# Patient Record
Sex: Male | Born: 1950
Health system: Southern US, Community
[De-identification: ages and names within clinical notes are randomized; demographics above are authoritative.]

## PROBLEM LIST (undated history)

## (undated) DIAGNOSIS — I10 Essential (primary) hypertension: Secondary | ICD-10-CM

## (undated) DIAGNOSIS — E785 Hyperlipidemia, unspecified: Secondary | ICD-10-CM

## (undated) DIAGNOSIS — I4821 Permanent atrial fibrillation: Secondary | ICD-10-CM

## (undated) DIAGNOSIS — I350 Nonrheumatic aortic (valve) stenosis: Secondary | ICD-10-CM

## (undated) DIAGNOSIS — G4733 Obstructive sleep apnea (adult) (pediatric): Secondary | ICD-10-CM

## (undated) DIAGNOSIS — E669 Obesity, unspecified: Secondary | ICD-10-CM

## (undated) DIAGNOSIS — I059 Rheumatic mitral valve disease, unspecified: Secondary | ICD-10-CM

## (undated) DIAGNOSIS — G4730 Sleep apnea, unspecified: Secondary | ICD-10-CM

## (undated) DIAGNOSIS — I38 Endocarditis, valve unspecified: Secondary | ICD-10-CM

## (undated) DIAGNOSIS — D126 Benign neoplasm of colon, unspecified: Secondary | ICD-10-CM

## (undated) DIAGNOSIS — I4891 Unspecified atrial fibrillation: Secondary | ICD-10-CM

## (undated) HISTORY — DX: Permanent atrial fibrillation: I48.21

## (undated) HISTORY — DX: Benign neoplasm of colon, unspecified: D12.6

## (undated) HISTORY — DX: Obesity, unspecified: E66.9

## (undated) HISTORY — DX: Essential (primary) hypertension: I10

## (undated) HISTORY — DX: Endocarditis, valve unspecified: I38

## (undated) HISTORY — DX: Rheumatic mitral valve disease, unspecified: I05.9

## (undated) HISTORY — DX: Sleep apnea, unspecified: G47.30

## (undated) HISTORY — DX: Obstructive sleep apnea (adult) (pediatric): G47.33

## (undated) HISTORY — DX: Hyperlipidemia, unspecified: E78.5

## (undated) HISTORY — PX: FOOT SURGERY: SHX648

## (undated) HISTORY — DX: Unspecified atrial fibrillation: I48.91

## (undated) HISTORY — DX: Nonrheumatic aortic (valve) stenosis: I35.0

---

## 2003-02-25 ENCOUNTER — Inpatient Hospital Stay (HOSPITAL_COMMUNITY): Admission: EM | Admit: 2003-02-25 | Discharge: 2003-02-28 | Payer: Self-pay | Admitting: Emergency Medicine

## 2003-02-26 ENCOUNTER — Encounter: Payer: Self-pay | Admitting: Cardiovascular Disease

## 2003-03-06 ENCOUNTER — Encounter: Admission: RE | Admit: 2003-03-06 | Discharge: 2003-03-06 | Payer: Self-pay | Admitting: Internal Medicine

## 2003-03-14 ENCOUNTER — Encounter: Admission: RE | Admit: 2003-03-14 | Discharge: 2003-03-14 | Payer: Self-pay | Admitting: Internal Medicine

## 2003-03-20 ENCOUNTER — Ambulatory Visit (HOSPITAL_COMMUNITY): Admission: RE | Admit: 2003-03-20 | Discharge: 2003-03-20 | Payer: Self-pay | Admitting: Cardiology

## 2003-03-20 ENCOUNTER — Encounter: Payer: Self-pay | Admitting: Cardiology

## 2003-03-26 ENCOUNTER — Encounter: Admission: RE | Admit: 2003-03-26 | Discharge: 2003-03-26 | Payer: Self-pay | Admitting: Internal Medicine

## 2003-07-06 DIAGNOSIS — D126 Benign neoplasm of colon, unspecified: Secondary | ICD-10-CM

## 2003-07-06 HISTORY — DX: Benign neoplasm of colon, unspecified: D12.6

## 2003-07-29 ENCOUNTER — Encounter: Payer: Self-pay | Admitting: Gastroenterology

## 2003-07-29 DIAGNOSIS — D126 Benign neoplasm of colon, unspecified: Secondary | ICD-10-CM | POA: Insufficient documentation

## 2003-07-30 ENCOUNTER — Encounter: Payer: Self-pay | Admitting: Gastroenterology

## 2004-02-09 ENCOUNTER — Ambulatory Visit: Payer: Self-pay | Admitting: Cardiology

## 2004-05-18 ENCOUNTER — Ambulatory Visit: Payer: Self-pay | Admitting: Family Medicine

## 2004-07-05 ENCOUNTER — Ambulatory Visit: Payer: Self-pay | Admitting: Internal Medicine

## 2004-07-12 ENCOUNTER — Ambulatory Visit: Payer: Self-pay | Admitting: Family Medicine

## 2004-07-19 ENCOUNTER — Ambulatory Visit: Payer: Self-pay | Admitting: Family Medicine

## 2004-10-18 ENCOUNTER — Ambulatory Visit: Payer: Self-pay | Admitting: Family Medicine

## 2004-12-20 ENCOUNTER — Ambulatory Visit: Payer: Self-pay | Admitting: Internal Medicine

## 2005-01-10 ENCOUNTER — Ambulatory Visit: Payer: Self-pay

## 2005-05-24 ENCOUNTER — Ambulatory Visit: Payer: Self-pay | Admitting: Family Medicine

## 2005-06-27 ENCOUNTER — Ambulatory Visit: Payer: Self-pay | Admitting: Internal Medicine

## 2005-07-11 ENCOUNTER — Ambulatory Visit: Payer: Self-pay | Admitting: Family Medicine

## 2006-06-01 ENCOUNTER — Ambulatory Visit: Payer: Self-pay | Admitting: Family Medicine

## 2006-06-12 ENCOUNTER — Ambulatory Visit: Payer: Self-pay | Admitting: Family Medicine

## 2006-06-12 LAB — CONVERTED CEMR LAB
ALT: 27 units/L (ref 0–40)
AST: 24 units/L (ref 0–37)
Albumin: 3.8 g/dL (ref 3.5–5.2)
Alkaline Phosphatase: 74 units/L (ref 39–117)
BUN: 22 mg/dL (ref 6–23)
Basophils Absolute: 0 10*3/uL (ref 0.0–0.1)
Basophils Relative: 0.3 % (ref 0.0–1.0)
Bilirubin, Direct: 0.1 mg/dL (ref 0.0–0.3)
CO2: 30 meq/L (ref 19–32)
Calcium: 10.6 mg/dL — ABNORMAL HIGH (ref 8.4–10.5)
Chloride: 104 meq/L (ref 96–112)
Cholesterol: 150 mg/dL (ref 0–200)
Creatinine, Ser: 0.8 mg/dL (ref 0.4–1.5)
Eosinophils Absolute: 0.3 10*3/uL (ref 0.0–0.6)
Eosinophils Relative: 3.6 % (ref 0.0–5.0)
GFR calc Af Amer: 129 mL/min
GFR calc non Af Amer: 106 mL/min
Glucose, Bld: 116 mg/dL — ABNORMAL HIGH (ref 70–99)
HCT: 40.9 % (ref 39.0–52.0)
HDL: 36.4 mg/dL — ABNORMAL LOW (ref >39.0)
Hemoglobin: 14.3 g/dL (ref 13.0–17.0)
Hgb A1c MFr Bld: 5.6 % (ref 4.6–6.0)
LDL Cholesterol: 94 mg/dL (ref 0–99)
Lymphocytes Relative: 31.5 % (ref 12.0–46.0)
MCHC: 34.9 g/dL (ref 30.0–36.0)
MCV: 92.7 fL (ref 78.0–100.0)
Monocytes Absolute: 0.8 10*3/uL — ABNORMAL HIGH (ref 0.2–0.7)
Monocytes Relative: 10.9 % (ref 3.0–11.0)
Neutro Abs: 4.1 10*3/uL (ref 1.4–7.7)
Neutrophils Relative %: 53.7 % (ref 43.0–77.0)
PSA: 0.22 ng/mL (ref 0.10–4.00)
Platelets: 237 10*3/uL (ref 150–400)
Potassium: 4.2 meq/L (ref 3.5–5.1)
RBC: 4.42 M/uL (ref 4.22–5.81)
RDW: 13 % (ref 11.5–14.6)
Sodium: 139 meq/L (ref 135–145)
TSH: 1.46 microintl units/mL (ref 0.35–5.50)
Total Bilirubin: 0.7 mg/dL (ref 0.3–1.2)
Total CHOL/HDL Ratio: 4.1
Total Protein: 6.7 g/dL (ref 6.0–8.3)
Triglycerides: 99 mg/dL (ref 0–149)
VLDL: 20 mg/dL (ref 0–40)
WBC: 7.6 10*3/uL (ref 4.5–10.5)

## 2006-07-03 ENCOUNTER — Ambulatory Visit: Payer: Self-pay

## 2006-07-03 ENCOUNTER — Encounter: Payer: Self-pay | Admitting: Cardiology

## 2006-07-10 ENCOUNTER — Ambulatory Visit: Payer: Self-pay | Admitting: Internal Medicine

## 2006-09-04 ENCOUNTER — Ambulatory Visit: Payer: Self-pay | Admitting: Family Medicine

## 2006-09-04 DIAGNOSIS — I1A0 Resistant hypertension: Secondary | ICD-10-CM | POA: Insufficient documentation

## 2006-09-04 DIAGNOSIS — I10 Essential (primary) hypertension: Secondary | ICD-10-CM | POA: Insufficient documentation

## 2006-09-04 DIAGNOSIS — Z87442 Personal history of urinary calculi: Secondary | ICD-10-CM | POA: Insufficient documentation

## 2006-09-04 DIAGNOSIS — I059 Rheumatic mitral valve disease, unspecified: Secondary | ICD-10-CM | POA: Insufficient documentation

## 2006-10-05 ENCOUNTER — Ambulatory Visit: Payer: Self-pay | Admitting: Gastroenterology

## 2006-11-14 ENCOUNTER — Encounter: Payer: Self-pay | Admitting: Family Medicine

## 2006-11-14 ENCOUNTER — Encounter: Payer: Self-pay | Admitting: Gastroenterology

## 2006-11-14 ENCOUNTER — Ambulatory Visit: Payer: Self-pay | Admitting: Gastroenterology

## 2007-05-01 ENCOUNTER — Ambulatory Visit: Payer: Self-pay | Admitting: Family Medicine

## 2007-05-01 DIAGNOSIS — J209 Acute bronchitis, unspecified: Secondary | ICD-10-CM

## 2007-06-18 DIAGNOSIS — E785 Hyperlipidemia, unspecified: Secondary | ICD-10-CM | POA: Insufficient documentation

## 2007-06-18 DIAGNOSIS — K644 Residual hemorrhoidal skin tags: Secondary | ICD-10-CM | POA: Insufficient documentation

## 2007-06-18 DIAGNOSIS — K648 Other hemorrhoids: Secondary | ICD-10-CM | POA: Insufficient documentation

## 2007-06-18 DIAGNOSIS — E669 Obesity, unspecified: Secondary | ICD-10-CM | POA: Insufficient documentation

## 2007-08-10 ENCOUNTER — Ambulatory Visit: Payer: Self-pay | Admitting: Family Medicine

## 2007-08-10 DIAGNOSIS — M722 Plantar fascial fibromatosis: Secondary | ICD-10-CM | POA: Insufficient documentation

## 2007-10-15 ENCOUNTER — Ambulatory Visit: Payer: Self-pay | Admitting: Family Medicine

## 2007-11-05 ENCOUNTER — Encounter: Payer: Self-pay | Admitting: Internal Medicine

## 2007-11-05 ENCOUNTER — Ambulatory Visit: Payer: Self-pay

## 2007-11-05 ENCOUNTER — Ambulatory Visit: Payer: Self-pay | Admitting: Internal Medicine

## 2007-11-05 LAB — CONVERTED CEMR LAB
AST: 25 units/L (ref 0–37)
Cholesterol: 132 mg/dL (ref 0–200)
HDL: 27.4 mg/dL — ABNORMAL LOW (ref >39.0)
LDL Cholesterol: 77 mg/dL (ref 0–99)
Total CHOL/HDL Ratio: 4.8
Triglycerides: 137 mg/dL (ref 0–149)
VLDL: 27 mg/dL (ref 0–40)

## 2007-11-19 ENCOUNTER — Ambulatory Visit: Payer: Self-pay | Admitting: Internal Medicine

## 2007-12-06 ENCOUNTER — Encounter: Admission: RE | Admit: 2007-12-06 | Discharge: 2008-03-05 | Payer: Self-pay | Admitting: Podiatry

## 2007-12-10 ENCOUNTER — Encounter: Admission: RE | Admit: 2007-12-10 | Discharge: 2008-01-07 | Payer: Self-pay | Admitting: Internal Medicine

## 2008-01-07 ENCOUNTER — Encounter: Admission: RE | Admit: 2008-01-07 | Discharge: 2008-01-07 | Payer: Self-pay | Admitting: Internal Medicine

## 2008-06-20 ENCOUNTER — Ambulatory Visit: Payer: Self-pay | Admitting: Family Medicine

## 2008-08-11 ENCOUNTER — Ambulatory Visit: Payer: Self-pay | Admitting: Family Medicine

## 2008-08-11 LAB — CONVERTED CEMR LAB
Bilirubin Urine: NEGATIVE
Blood in Urine, dipstick: NEGATIVE
Glucose, Urine, Semiquant: NEGATIVE
Ketones, urine, test strip: NEGATIVE
Nitrite: NEGATIVE
Protein, U semiquant: NEGATIVE
Specific Gravity, Urine: 1.02
Urobilinogen, UA: 0.2
WBC Urine, dipstick: NEGATIVE
pH: 5.5

## 2008-08-15 LAB — CONVERTED CEMR LAB
ALT: 30 units/L (ref 0–53)
AST: 32 units/L (ref 0–37)
Albumin: 3.8 g/dL (ref 3.5–5.2)
Alkaline Phosphatase: 75 units/L (ref 39–117)
BUN: 20 mg/dL (ref 6–23)
Basophils Absolute: 0 10*3/uL (ref 0.0–0.1)
Basophils Relative: 0.2 % (ref 0.0–3.0)
Bilirubin, Direct: 0.1 mg/dL (ref 0.0–0.3)
CO2: 28 meq/L (ref 19–32)
Calcium: 10.1 mg/dL (ref 8.4–10.5)
Chloride: 111 meq/L (ref 96–112)
Cholesterol: 140 mg/dL (ref 0–200)
Creatinine, Ser: 0.9 mg/dL (ref 0.4–1.5)
Eosinophils Absolute: 0.1 10*3/uL (ref 0.0–0.7)
Eosinophils Relative: 2 % (ref 0.0–5.0)
GFR calc non Af Amer: 91.93 mL/min (ref >60)
Glucose, Bld: 114 mg/dL — ABNORMAL HIGH (ref 70–99)
HCT: 42.1 % (ref 39.0–52.0)
HDL: 36.8 mg/dL — ABNORMAL LOW (ref >39.00)
Hemoglobin: 15 g/dL (ref 13.0–17.0)
LDL Cholesterol: 86 mg/dL (ref 0–99)
Lymphocytes Relative: 32.9 % (ref 12.0–46.0)
Lymphs Abs: 2.1 10*3/uL (ref 0.7–4.0)
MCHC: 35.6 g/dL (ref 30.0–36.0)
MCV: 92.5 fL (ref 78.0–100.0)
Monocytes Absolute: 0.5 10*3/uL (ref 0.1–1.0)
Monocytes Relative: 7.7 % (ref 3.0–12.0)
Neutro Abs: 3.7 10*3/uL (ref 1.4–7.7)
Neutrophils Relative %: 57.2 % (ref 43.0–77.0)
PSA: 0.24 ng/mL (ref 0.10–4.00)
Platelets: 192 10*3/uL (ref 150.0–400.0)
Potassium: 3.9 meq/L (ref 3.5–5.1)
RBC: 4.55 M/uL (ref 4.22–5.81)
RDW: 12.4 % (ref 11.5–14.6)
Sodium: 141 meq/L (ref 135–145)
TSH: 1.23 microintl units/mL (ref 0.35–5.50)
Total Bilirubin: 0.9 mg/dL (ref 0.3–1.2)
Total CHOL/HDL Ratio: 4
Total Protein: 6.9 g/dL (ref 6.0–8.3)
Triglycerides: 86 mg/dL (ref 0.0–149.0)
VLDL: 17.2 mg/dL (ref 0.0–40.0)
WBC: 6.4 10*3/uL (ref 4.5–10.5)

## 2008-08-18 ENCOUNTER — Ambulatory Visit: Payer: Self-pay | Admitting: Family Medicine

## 2008-08-18 DIAGNOSIS — Z8639 Personal history of other endocrine, nutritional and metabolic disease: Secondary | ICD-10-CM

## 2008-08-18 DIAGNOSIS — Z862 Personal history of diseases of the blood and blood-forming organs and certain disorders involving the immune mechanism: Secondary | ICD-10-CM | POA: Insufficient documentation

## 2008-08-18 DIAGNOSIS — I4891 Unspecified atrial fibrillation: Secondary | ICD-10-CM | POA: Insufficient documentation

## 2008-08-20 ENCOUNTER — Encounter (INDEPENDENT_AMBULATORY_CARE_PROVIDER_SITE_OTHER): Payer: Self-pay | Admitting: Cardiology

## 2008-08-20 ENCOUNTER — Encounter: Payer: Self-pay | Admitting: Physician Assistant

## 2008-08-20 ENCOUNTER — Encounter: Payer: Self-pay | Admitting: Cardiology

## 2008-08-20 ENCOUNTER — Ambulatory Visit: Payer: Self-pay | Admitting: Cardiology

## 2008-08-20 ENCOUNTER — Ambulatory Visit: Payer: Self-pay | Admitting: Internal Medicine

## 2008-08-20 DIAGNOSIS — I359 Nonrheumatic aortic valve disorder, unspecified: Secondary | ICD-10-CM | POA: Insufficient documentation

## 2008-08-20 LAB — CONVERTED CEMR LAB
POC INR: 0.9
Protime: 11.8

## 2008-08-25 ENCOUNTER — Encounter (INDEPENDENT_AMBULATORY_CARE_PROVIDER_SITE_OTHER): Payer: Self-pay | Admitting: *Deleted

## 2008-08-25 ENCOUNTER — Ambulatory Visit: Payer: Self-pay | Admitting: Internal Medicine

## 2008-08-25 LAB — CONVERTED CEMR LAB
INR: 1.3
POC INR: 1.3
Protime: 14.2

## 2008-08-29 ENCOUNTER — Encounter: Payer: Self-pay | Admitting: Internal Medicine

## 2008-08-29 ENCOUNTER — Ambulatory Visit: Payer: Self-pay | Admitting: Internal Medicine

## 2008-08-29 ENCOUNTER — Ambulatory Visit: Payer: Self-pay

## 2008-08-29 ENCOUNTER — Encounter: Payer: Self-pay | Admitting: Cardiology

## 2008-09-01 ENCOUNTER — Ambulatory Visit: Payer: Self-pay | Admitting: Cardiology

## 2008-09-01 LAB — CONVERTED CEMR LAB
POC INR: 1.7
Prothrombin Time: 16.2 s

## 2008-09-03 ENCOUNTER — Telehealth: Payer: Self-pay | Admitting: Cardiology

## 2008-09-03 LAB — CONVERTED CEMR LAB: TSH: 1.556 microintl units/mL (ref 0.350–4.500)

## 2008-09-05 ENCOUNTER — Ambulatory Visit: Payer: Self-pay | Admitting: Internal Medicine

## 2008-09-05 DIAGNOSIS — G4730 Sleep apnea, unspecified: Secondary | ICD-10-CM | POA: Insufficient documentation

## 2008-09-11 ENCOUNTER — Ambulatory Visit: Payer: Self-pay | Admitting: Internal Medicine

## 2008-09-11 LAB — CONVERTED CEMR LAB
POC INR: 1.7
Prothrombin Time: 15.9 s

## 2008-09-22 ENCOUNTER — Ambulatory Visit: Payer: Self-pay | Admitting: Cardiology

## 2008-09-22 LAB — CONVERTED CEMR LAB
POC INR: 1.7
Prothrombin Time: 16.2 s

## 2008-10-05 ENCOUNTER — Ambulatory Visit (HOSPITAL_BASED_OUTPATIENT_CLINIC_OR_DEPARTMENT_OTHER): Admission: RE | Admit: 2008-10-05 | Discharge: 2008-10-05 | Payer: Self-pay | Admitting: Internal Medicine

## 2008-10-06 ENCOUNTER — Ambulatory Visit: Payer: Self-pay | Admitting: Internal Medicine

## 2008-10-06 LAB — CONVERTED CEMR LAB
POC INR: 1.9
Prothrombin Time: 17.1 s

## 2008-10-18 ENCOUNTER — Encounter: Payer: Self-pay | Admitting: Internal Medicine

## 2008-10-18 ENCOUNTER — Ambulatory Visit: Payer: Self-pay | Admitting: Pulmonary Disease

## 2008-10-20 ENCOUNTER — Ambulatory Visit: Payer: Self-pay | Admitting: Cardiovascular Disease

## 2008-10-20 LAB — CONVERTED CEMR LAB: POC INR: 2

## 2008-10-27 ENCOUNTER — Telehealth (INDEPENDENT_AMBULATORY_CARE_PROVIDER_SITE_OTHER): Payer: Self-pay | Admitting: *Deleted

## 2008-11-03 ENCOUNTER — Ambulatory Visit: Payer: Self-pay | Admitting: Cardiovascular Disease

## 2008-11-17 ENCOUNTER — Ambulatory Visit: Payer: Self-pay | Admitting: Pulmonary Disease

## 2008-11-17 DIAGNOSIS — G4733 Obstructive sleep apnea (adult) (pediatric): Secondary | ICD-10-CM | POA: Insufficient documentation

## 2008-11-21 ENCOUNTER — Ambulatory Visit: Payer: Self-pay | Admitting: Internal Medicine

## 2008-11-24 ENCOUNTER — Ambulatory Visit: Payer: Self-pay | Admitting: Cardiology

## 2008-11-24 LAB — CONVERTED CEMR LAB: POC INR: 2

## 2008-12-15 ENCOUNTER — Ambulatory Visit: Payer: Self-pay | Admitting: Internal Medicine

## 2008-12-15 LAB — CONVERTED CEMR LAB: POC INR: 2.2

## 2008-12-22 ENCOUNTER — Ambulatory Visit: Payer: Self-pay | Admitting: Pulmonary Disease

## 2009-01-12 ENCOUNTER — Ambulatory Visit: Payer: Self-pay | Admitting: Internal Medicine

## 2009-01-12 LAB — CONVERTED CEMR LAB: POC INR: 2.1

## 2009-02-13 ENCOUNTER — Ambulatory Visit: Payer: Self-pay | Admitting: Cardiology

## 2009-02-13 LAB — CONVERTED CEMR LAB: POC INR: 1.9

## 2009-02-16 ENCOUNTER — Ambulatory Visit: Payer: Self-pay | Admitting: Internal Medicine

## 2009-02-20 ENCOUNTER — Ambulatory Visit: Payer: Self-pay | Admitting: Cardiovascular Disease

## 2009-02-20 LAB — CONVERTED CEMR LAB: POC INR: 1.9

## 2009-02-26 ENCOUNTER — Ambulatory Visit: Payer: Self-pay | Admitting: Cardiology

## 2009-02-26 LAB — CONVERTED CEMR LAB: POC INR: 2.1

## 2009-03-09 ENCOUNTER — Ambulatory Visit: Payer: Self-pay | Admitting: Cardiology

## 2009-03-09 LAB — CONVERTED CEMR LAB: POC INR: 2.1

## 2009-03-14 ENCOUNTER — Encounter: Payer: Self-pay | Admitting: Pulmonary Disease

## 2009-03-16 ENCOUNTER — Ambulatory Visit: Payer: Self-pay | Admitting: Internal Medicine

## 2009-03-16 LAB — CONVERTED CEMR LAB: POC INR: 2.4

## 2009-03-23 ENCOUNTER — Ambulatory Visit: Payer: Self-pay | Admitting: Cardiology

## 2009-03-23 ENCOUNTER — Encounter (INDEPENDENT_AMBULATORY_CARE_PROVIDER_SITE_OTHER): Payer: Self-pay | Admitting: *Deleted

## 2009-03-23 LAB — CONVERTED CEMR LAB: POC INR: 2.2

## 2009-03-26 ENCOUNTER — Encounter: Payer: Self-pay | Admitting: Pulmonary Disease

## 2009-04-03 ENCOUNTER — Ambulatory Visit: Payer: Self-pay | Admitting: Internal Medicine

## 2009-04-03 ENCOUNTER — Ambulatory Visit (HOSPITAL_COMMUNITY): Admission: RE | Admit: 2009-04-03 | Discharge: 2009-04-03 | Payer: Self-pay | Admitting: Internal Medicine

## 2009-04-07 ENCOUNTER — Ambulatory Visit: Payer: Self-pay | Admitting: Internal Medicine

## 2009-04-07 LAB — CONVERTED CEMR LAB: POC INR: 2.2

## 2009-04-27 ENCOUNTER — Ambulatory Visit: Payer: Self-pay | Admitting: Internal Medicine

## 2009-04-27 LAB — CONVERTED CEMR LAB: POC INR: 2.4

## 2009-05-08 ENCOUNTER — Telehealth: Payer: Self-pay | Admitting: Internal Medicine

## 2009-05-25 ENCOUNTER — Ambulatory Visit: Payer: Self-pay | Admitting: Cardiology

## 2009-05-25 LAB — CONVERTED CEMR LAB: POC INR: 2.3

## 2009-06-01 ENCOUNTER — Ambulatory Visit (HOSPITAL_COMMUNITY): Admission: RE | Admit: 2009-06-01 | Discharge: 2009-06-01 | Payer: Self-pay | Admitting: Internal Medicine

## 2009-06-01 ENCOUNTER — Ambulatory Visit: Payer: Self-pay | Admitting: Internal Medicine

## 2009-06-01 ENCOUNTER — Encounter: Payer: Self-pay | Admitting: Internal Medicine

## 2009-06-05 ENCOUNTER — Ambulatory Visit: Payer: Self-pay | Admitting: Internal Medicine

## 2009-06-15 ENCOUNTER — Ambulatory Visit: Payer: Self-pay | Admitting: Pulmonary Disease

## 2009-06-22 ENCOUNTER — Ambulatory Visit: Payer: Self-pay | Admitting: Cardiology

## 2009-06-22 LAB — CONVERTED CEMR LAB: POC INR: 1.9

## 2009-07-13 ENCOUNTER — Ambulatory Visit: Payer: Self-pay | Admitting: Cardiology

## 2009-07-13 LAB — CONVERTED CEMR LAB: POC INR: 2.3

## 2009-08-04 ENCOUNTER — Ambulatory Visit: Payer: Self-pay | Admitting: Internal Medicine

## 2009-08-04 LAB — CONVERTED CEMR LAB: POC INR: 2.4

## 2009-09-01 ENCOUNTER — Ambulatory Visit: Payer: Self-pay | Admitting: Internal Medicine

## 2009-09-01 LAB — CONVERTED CEMR LAB: POC INR: 2.3

## 2009-09-28 ENCOUNTER — Ambulatory Visit: Payer: Self-pay | Admitting: Cardiology

## 2009-09-28 LAB — CONVERTED CEMR LAB: POC INR: 2.3

## 2009-10-15 ENCOUNTER — Encounter (INDEPENDENT_AMBULATORY_CARE_PROVIDER_SITE_OTHER): Payer: Self-pay | Admitting: *Deleted

## 2009-10-26 ENCOUNTER — Ambulatory Visit: Payer: Self-pay | Admitting: Cardiology

## 2009-10-26 LAB — CONVERTED CEMR LAB: POC INR: 2.4

## 2009-10-29 ENCOUNTER — Ambulatory Visit: Payer: Self-pay | Admitting: Family Medicine

## 2009-11-02 ENCOUNTER — Ambulatory Visit: Payer: Self-pay | Admitting: Family Medicine

## 2009-11-02 LAB — CONVERTED CEMR LAB
Bilirubin Urine: NEGATIVE
Glucose, Urine, Semiquant: NEGATIVE
Ketones, urine, test strip: NEGATIVE
Nitrite: NEGATIVE
Protein, U semiquant: NEGATIVE
Specific Gravity, Urine: 1.02
Urobilinogen, UA: 0.2
WBC Urine, dipstick: NEGATIVE
pH: 5

## 2009-11-03 LAB — CONVERTED CEMR LAB
ALT: 27 units/L (ref 0–53)
AST: 24 units/L (ref 0–37)
Albumin: 4 g/dL (ref 3.5–5.2)
Alkaline Phosphatase: 72 units/L (ref 39–117)
BUN: 16 mg/dL (ref 6–23)
Basophils Absolute: 0 10*3/uL (ref 0.0–0.1)
Basophils Relative: 0.4 % (ref 0.0–3.0)
Bilirubin, Direct: 0.1 mg/dL (ref 0.0–0.3)
CO2: 26 meq/L (ref 19–32)
Calcium: 10.5 mg/dL (ref 8.4–10.5)
Chloride: 106 meq/L (ref 96–112)
Cholesterol: 126 mg/dL (ref 0–200)
Creatinine, Ser: 0.8 mg/dL (ref 0.4–1.5)
Eosinophils Absolute: 0.1 10*3/uL (ref 0.0–0.7)
Eosinophils Relative: 2.1 % (ref 0.0–5.0)
GFR calc non Af Amer: 100.51 mL/min (ref >60)
Glucose, Bld: 102 mg/dL — ABNORMAL HIGH (ref 70–99)
HCT: 41.3 % (ref 39.0–52.0)
HDL: 36.1 mg/dL — ABNORMAL LOW (ref >39.00)
Hemoglobin: 14.3 g/dL (ref 13.0–17.0)
LDL Cholesterol: 76 mg/dL (ref 0–99)
Lymphocytes Relative: 29.6 % (ref 12.0–46.0)
Lymphs Abs: 1.9 10*3/uL (ref 0.7–4.0)
MCHC: 34.6 g/dL (ref 30.0–36.0)
MCV: 94.6 fL (ref 78.0–100.0)
Monocytes Absolute: 0.6 10*3/uL (ref 0.1–1.0)
Monocytes Relative: 8.8 % (ref 3.0–12.0)
Neutro Abs: 3.7 10*3/uL (ref 1.4–7.7)
Neutrophils Relative %: 59.1 % (ref 43.0–77.0)
PSA: 0.23 ng/mL (ref 0.10–4.00)
Platelets: 187 10*3/uL (ref 150.0–400.0)
Potassium: 3.9 meq/L (ref 3.5–5.1)
RBC: 4.37 M/uL (ref 4.22–5.81)
RDW: 13.6 % (ref 11.5–14.6)
Sodium: 141 meq/L (ref 135–145)
TSH: 1.73 microintl units/mL (ref 0.35–5.50)
Total Bilirubin: 0.6 mg/dL (ref 0.3–1.2)
Total CHOL/HDL Ratio: 3
Total Protein: 6.8 g/dL (ref 6.0–8.3)
Triglycerides: 69 mg/dL (ref 0.0–149.0)
VLDL: 13.8 mg/dL (ref 0.0–40.0)
WBC: 6.3 10*3/uL (ref 4.5–10.5)

## 2009-11-10 ENCOUNTER — Encounter (INDEPENDENT_AMBULATORY_CARE_PROVIDER_SITE_OTHER): Payer: Self-pay | Admitting: *Deleted

## 2009-11-13 ENCOUNTER — Encounter (INDEPENDENT_AMBULATORY_CARE_PROVIDER_SITE_OTHER): Payer: Self-pay | Admitting: *Deleted

## 2009-11-23 ENCOUNTER — Ambulatory Visit: Payer: Self-pay | Admitting: Cardiovascular Disease

## 2009-11-23 LAB — CONVERTED CEMR LAB: POC INR: 2.3

## 2009-11-30 ENCOUNTER — Ambulatory Visit: Payer: Self-pay | Admitting: Internal Medicine

## 2009-12-22 ENCOUNTER — Ambulatory Visit: Payer: Self-pay | Admitting: Internal Medicine

## 2009-12-22 LAB — CONVERTED CEMR LAB: POC INR: 2.3

## 2010-01-18 ENCOUNTER — Ambulatory Visit: Payer: Self-pay | Admitting: Internal Medicine

## 2010-01-18 LAB — CONVERTED CEMR LAB: POC INR: 2.5

## 2010-01-20 ENCOUNTER — Ambulatory Visit: Payer: Self-pay | Admitting: Gastroenterology

## 2010-01-20 ENCOUNTER — Telehealth: Payer: Self-pay | Admitting: Family Medicine

## 2010-01-20 DIAGNOSIS — Z860100 Personal history of colon polyps, unspecified: Secondary | ICD-10-CM | POA: Insufficient documentation

## 2010-01-20 DIAGNOSIS — K5909 Other constipation: Secondary | ICD-10-CM | POA: Insufficient documentation

## 2010-02-15 ENCOUNTER — Ambulatory Visit: Payer: Self-pay | Admitting: Cardiology

## 2010-02-15 LAB — CONVERTED CEMR LAB: POC INR: 2.4

## 2010-03-16 ENCOUNTER — Ambulatory Visit
Admission: RE | Admit: 2010-03-16 | Discharge: 2010-03-16 | Payer: Self-pay | Source: Home / Self Care | Attending: Gastroenterology | Admitting: Gastroenterology

## 2010-03-16 ENCOUNTER — Encounter: Payer: Self-pay | Admitting: Gastroenterology

## 2010-03-23 ENCOUNTER — Encounter: Payer: Self-pay | Admitting: Gastroenterology

## 2010-03-26 ENCOUNTER — Ambulatory Visit
Admission: RE | Admit: 2010-03-26 | Discharge: 2010-03-26 | Payer: Self-pay | Source: Home / Self Care | Attending: Cardiology | Admitting: Cardiology

## 2010-03-26 LAB — CONVERTED CEMR LAB: POC INR: 2.3

## 2010-04-06 NOTE — Assessment & Plan Note (Signed)
Summary: 2 [email or web address]   Visit Type:  Follow-up Referring Provider:  [name] Primary Provider:  [name] MD  CC:  pt has had a pain in side of his neck.Pt was 301lbs toady he is 293lbs..  History of Present Illness: patient is a 60 year old with a history of mitral regurgitation, hypertension, sleep apnea (uses CPAP) and atrial fibrillation.  He underwent cardioversion to SR a few wks ago  The week after cardioversion he said he noticed more energy.  He thinks this has continued.  he denies palpitations though he has never had any.  Current Medications (verified): 1)  Clonidine Hcl 0.1 Mg Tabs (Clonidine Hcl) .... Take 1 Tablet By Mouth Twice A Day 2)  Hydrochlorothiazide 25 Mg Tabs (Hydrochlorothiazide) .... 1/2  By Mouth Once Daily 3)  Norvasc 10 Mg Tabs (Amlodipine Besylate) .... Take 1 Tablet By Mouth Once A Day 4)  Simvastatin 40 Mg Tabs (Simvastatin) .Marland Kitchen.. 1 By Mouth Once Daily 5)  Metoprolol Tartrate 100 Mg  Tabs (Metoprolol Tartrate) .... Two Times A Day 6)  Etodolac 500 Mg  Tabs (Etodolac) .... Two Times A Day As Needed Pain 7)  Lisinopril 20 Mg Tabs (Lisinopril) .... 2 Tabs Once Daily 8)  Fish Oil   Oil (Fish Oil) .... Once Daily 9)  Warfarin Sodium 5 Mg Tabs (Warfarin Sodium) .... Use As Directed By Anticoagulation Clinic 10)  Aspirin 81 Mg Tbec (Aspirin) .... Take One Tablet By Mouth Daily  Allergies (verified): 1)  Penicillin V Potassium (Penicillin V Potassium)  Past History:  Past Surgical History: Last updated: 08/18/2008 Denies surgical history colonoscopy 11-14-06 per Dr. Russella Dar, benign polyps, repeat 3 yrs  Social History: Last updated: 11/17/2008 Single Never Smoked Alcohol use-yes Drug use-no pt works as asst Theatre stage manager.    Past Medical History: Colonic polyps, hx of  Current Problems:  MITRAL REGURGITATION, MODERATE (ICD-424.0) ATRIAL FIBRILLATION (ICD-427.31) HYPERTENSION (ICD-401.9) DYSLIPIDEMIA (ICD-272.4) HEALTH MAINTENANCE EXAM  (ICD-V70.0) PLANTAR FASCIITIS (ICD-728.71) OBESITY (ICD-278.00) EXTERNAL HEMORRHOIDS (ICD-455.3) INTERNAL HEMORRHOIDS (ICD-455.0) ACUTE BRONCHITIS (ICD-466.0) FAMILY HISTORY OF CAD MALE 1ST DEGREE RELATIVE <50 (ICD-V17.3) ADENOMATOUS COLONIC POLYP (ICD-211.3) RENAL CALCULUS, HX OF (ICD-V13.01) HYPOKALEMIA, HX OF (ICD-V12.2) Sleep apnea  Vital Signs:  Patient profile:   60 year old male Height:      72 inches Weight:      293 pounds Pulse rate:   49 / minute BP sitting:   142 / 74  (left arm) Cuff size:   large  Vitals Entered By: Burnett Kanaris, CNA (April 27, 2009 11:31 AM)  Physical Exam  Additional Exam:  OBese 60 year old in NAD. HEENT:  Normocephalic, atraumatic. EOMI, PERRLA.  Neck: JVP is normal. No thyromegaly. No bruits.  Lungs: clear to auscultation. No rales no wheezes.  Heart: Irregular rate and rhythm. Normal S1, S2. No S3.  Gr I/VI systolic murmur at apex. PMI not displaced.  Abdomen:  Supple, nontender. Normal bowel sounds. No masses. No hepatomegaly.  Extremities:   Good distal pulses throughout. No lower extremity edema.  Musculoskeletal :moving all extremities.  Neuro:   alert and oriented x3.    EKG  Procedure date:  04/27/2009  Findings:      ATrial fibrillation  49 bpm.  LAFB.  Impression & Recommendations:  Problem # 1:  ATRIAL FIBRILLATION (ICD-427.31) Patient is back in atrial fibrillation.  He does not sense it.  COntinue coumadin. His updated medication list for this problem includes:    Metoprolol Tartrate 100 Mg Tabs (Metoprolol  tartrate) .Marland Kitchen..Marland Kitchen Two times a day    Warfarin Sodium 5 Mg Tabs (Warfarin sodium) ..... Use as directed by anticoagulation clinic    Aspirin 81 Mg Tbec (Aspirin) .Marland Kitchen... Take one tablet by mouth daily  Orders: EKG w/ Interpretation (93000)  Problem # 2:  MITRAL REGURGITATION, MODERATE (ICD-424.0) Patient with significant MR in past.  Very difficult to see on echo since it is very eccentric.  Involves both  anterior and posterior leaflets LV size and function, LA size has not significantly changed.  LA is mild ly dilated.  BUT afib is new.  I am worried this is due to long standing MR.  QUestion if valve should be repaired surgically.    I will review.  Consider TEE>  Problem # 3:  HYPERTENSION (ICD-401.9) Adequate control His updated medication list for this problem includes:    Clonidine Hcl 0.1 Mg Tabs (Clonidine hcl) .Marland Kitchen... Take 1 tablet by mouth twice a day    Hydrochlorothiazide 25 Mg Tabs (Hydrochlorothiazide) .Marland Kitchen... 1/2  by mouth once daily    Norvasc 10 Mg Tabs (Amlodipine besylate) .Marland Kitchen... Take 1 tablet by mouth once a day    Metoprolol Tartrate 100 Mg Tabs (Metoprolol tartrate) .Marland Kitchen..Marland Kitchen Two times a day    Lisinopril 20 Mg Tabs (Lisinopril) .Marland Kitchen... 2 tabs once daily    Aspirin 81 Mg Tbec (Aspirin) .Marland Kitchen... Take one tablet by mouth daily  Problem # 4:  SLEEP APNEA (ICD-780.57) Continue CPAP

## 2010-04-06 NOTE — Assessment & Plan Note (Signed)
Summary: eph/. appt is 10:45/ tee done on 3/28/ gd   Referring Provider:  [name] Primary Provider:  [name] MD  CC:  Post Hosp F/U.  History of Present Illness: Patient is a 60 year old with a history of mitral regurgitation, atrial fibrillation, hypertension, sleep apnea and obesity. I saw him on Monday of this week when he underwent TEE.  this showed only mild MR and mild prolapse of the psoterir mitral leaflet. since seen he denies palpitations, no SOB, no chest pain.  Problems Prior to Update: 1)  Obstructive Sleep Apnea  (ICD-327.23) 2)  Sleep Apnea  (ICD-780.57) 3)  Aortic Stenosis/ Insufficiency, Non-rheumatic  (ICD-424.1) 4)  Aortic Stenosis/ Insufficiency, Non-rheumatic  (ICD-424.1) 5)  Mitral Regurgitation, Moderate  (ICD-424.0) 6)  Atrial Fibrillation  (ICD-427.31) 7)  Hypertension  (ICD-401.9) 8)  Dyslipidemia  (ICD-272.4) 9)  Health Maintenance Exam  (ICD-V70.0) 10)  Plantar Fasciitis  (ICD-728.71) 11)  Obesity  (ICD-278.00) 12)  External Hemorrhoids  (ICD-455.3) 13)  Internal Hemorrhoids  (ICD-455.0) 14)  Acute Bronchitis  (ICD-466.0) 15)  Family History of Cad Male 1st Degree Relative <50  (ICD-V17.3) 16)  Adenomatous Colonic Polyp  (ICD-211.3) 17)  Renal Calculus, Hx of  (ICD-V13.01) 18)  Hypokalemia, Hx of  (ICD-V12.2)  Current Medications (verified): 1)  Clonidine Hcl 0.1 Mg Tabs (Clonidine Hcl) .... Take 1 Tablet By Mouth Twice A Day 2)  Hydrochlorothiazide 25 Mg Tabs (Hydrochlorothiazide) .... 1/2  By Mouth Once Daily 3)  Norvasc 10 Mg Tabs (Amlodipine Besylate) .... Take 1 Tablet By Mouth Once A Day 4)  Simvastatin 40 Mg Tabs (Simvastatin) .Marland Kitchen.. 1 By Mouth Once Daily 5)  Etodolac 500 Mg  Tabs (Etodolac) .... Two Times A Day As Needed Pain 6)  Lisinopril 20 Mg Tabs (Lisinopril) .... 2 Tabs Once Daily 7)  Fish Oil   Oil (Fish Oil) .... Once Daily 8)  Warfarin Sodium 5 Mg Tabs (Warfarin Sodium) .... Use As Directed By Anticoagulation Clinic 9)   Aspirin 81 Mg Tbec (Aspirin) .... Take One Tablet By Mouth Daily 10)  Carvedilol 25 Mg Tabs (Carvedilol) .... Take One Tablet By Mouth Twice A Day  Allergies: 1)  Penicillin V Potassium (Penicillin V Potassium)  Past History:  Past medical, surgical, family and social histories (including risk factors) reviewed, and no changes noted (except as noted below).  Past Medical History: Reviewed history from 04/27/2009 and no changes required. Colonic polyps, hx of  Current Problems:  MITRAL REGURGITATION, MODERATE (ICD-424.0) ATRIAL FIBRILLATION (ICD-427.31) HYPERTENSION (ICD-401.9) DYSLIPIDEMIA (ICD-272.4) HEALTH MAINTENANCE EXAM (ICD-V70.0) PLANTAR FASCIITIS (ICD-728.71) OBESITY (ICD-278.00) EXTERNAL HEMORRHOIDS (ICD-455.3) INTERNAL HEMORRHOIDS (ICD-455.0) ACUTE BRONCHITIS (ICD-466.0) FAMILY HISTORY OF CAD MALE 1ST DEGREE RELATIVE <50 (ICD-V17.3) ADENOMATOUS COLONIC POLYP (ICD-211.3) RENAL CALCULUS, HX OF (ICD-V13.01) HYPOKALEMIA, HX OF (ICD-V12.2) Sleep apnea  Past Surgical History: Reviewed history from 08/18/2008 and no changes required. Denies surgical history colonoscopy 11-14-06 per Dr. Russella Dar, benign polyps, repeat 3 yrs  Family History: Reviewed history from 11/17/2008 and no changes required. Family History of CAD Male 1st degree relative <50 Family History Hypertension    heart disease: father   Social History: Reviewed history from 11/17/2008 and no changes required. Single Never Smoked Alcohol use-yes Drug use-no pt works as asst Theatre stage manager.    Vital Signs:  Patient profile:   60 year old male Height:      72 inches Weight:      297 pounds BMI:     40.43 Pulse rate:   65 /  minute BP sitting:   156 / 89  (left arm)  Vitals Entered By: Stanton Kidney, EMT-P (June 05, 2009 10:37 AM)  Physical Exam  Additional Exam:  Patient is an obese 60 year old in NAD HEENT:  Normocephalic, atraumatic. EOMI, PERRLA.  Neck: JVP is normal. No thyromegaly. No  bruits.  Lungs: clear to auscultation. No rales no wheezes.  Heart: Regular rate and rhythm. Normal S1, S2. No S3.   No significant murmurs. PMI not displaced.  Abdomen: Obese.   Supple, nontender. Normal bowel sounds. No masses. No hepatomegaly.  Extremities:   Good distal pulses throughout. Trace edema. Musculoskeletal :moving all extremities.  Neuro:   alert and oriented x3.    EKG  Procedure date:  06/05/2009  Findings:      Atrial fibrillation 57 bpm.  LAD  Impression & Recommendations:  Problem # 1:  MITRAL REGURGITATION, MODERATE (ICD-424.0) TEE earlier this week MR appears mild.  I would continue to follow. I would keep on abx pre op since he has tolerated.  Problem # 2:  SLEEP APNEA (ICD-780.57) Keep on CPAP  Problem # 3:  ATRIAL FIBRILLATION (ICD-427.31) Failed cardioversion earlier this year.  Is asymptomatic. LA is mildly dilated but afib is not due to severee MR. Continue current regimen.  Problem # 4:  HYPERTENSION (ICD-401.9) ON recheck BP is 120/70.  Continue meds.  Problem # 5:  DYSLIPIDEMIA (ICD-272.4) COninue. His updated medication list for this problem includes:    Simvastatin 40 Mg Tabs (Simvastatin) .Marland Kitchen... 1 by mouth once daily  Other Orders: EKG w/ Interpretation (93000)

## 2010-04-06 NOTE — Letter (Signed)
Summary: CMN for CPAP Supplies/HCS  CMN for CPAP Supplies/HCS   Imported By: Sherian Rein 04/02/2009 08:03:12  _____________________________________________________________________  External Attachment:    Type:   Image     Comment:   External Document

## 2010-04-06 NOTE — Medication Information (Signed)
Summary: rov/ewj  Anticoagulant Therapy  Managed by: Jeralene Peters, PharmD Referring MD: Tenny Craw MD, Gunnar Fusi PCP: [name] MD Supervising MD: Jens Som MD, Arlys John Indication 1: Atrial Fibrillation Lab Used: [facility] [place] Site: [address] INR POC 2.2 INR RANGE 2.0-3.0  Dietary changes: no    Health status changes: no    Bleeding/hemorrhagic complications: no    Recent/future hospitalizations: yes       Details: UPCOMING CARDIOVERSION - INR NOW THERAPEUTIC X 4 WEEKS  Any changes in medication regimen? no    Recent/future dental: no  Any missed doses?: no       Is patient compliant with meds? yes       Current Medications (verified): 1)  Aug Betamethasone Dipropionate 0.05 % Crea (Aug Betamethasone Dipropionate) .... As Needed 2)  Clonidine Hcl 0.1 Mg Tabs (Clonidine Hcl) .... Take 1 Tablet By Mouth Twice A Day 3)  Hydrochlorothiazide 25 Mg Tabs (Hydrochlorothiazide) .... 1/2  By Mouth Once Daily 4)  Norvasc 10 Mg Tabs (Amlodipine Besylate) .... Take 1 Tablet By Mouth Once A Day 5)  Simvastatin 40 Mg Tabs (Simvastatin) .Marland Kitchen.. 1 By Mouth Once Daily 6)  Metoprolol Tartrate 100 Mg  Tabs (Metoprolol Tartrate) .... Two Times A Day 7)  Etodolac 500 Mg  Tabs (Etodolac) .... Two Times A Day As Needed Pain 8)  Lisinopril 20 Mg Tabs (Lisinopril) .... 2 Tabs Once Daily 9)  Fish Oil   Oil (Fish Oil) .... Once Daily 10)  Warfarin Sodium 5 Mg Tabs (Warfarin Sodium) .... Use As Directed By Anticoagulation Clinic 11)  Aspirin 81 Mg Tbec (Aspirin) .... Take One Tablet By Mouth Daily  Allergies (verified): 1)  Penicillin V Potassium (Penicillin V Potassium)  Anticoagulation Management History:      The patient is taking warfarin and comes in today for a routine follow up visit.  Anticoagulation is being administered due to chronic atrial fibrillation.  Negative risk factors for bleeding include an age less than 89 years old, no history of CVA/TIA, no history of GI  bleeding, and absence of serious comorbidities.  The bleeding index is 'low risk'.  Positive CHADS2 values include History of HTN.  Negative CHADS2 values include History of CHF, Age > 74 years old, History of Diabetes, and Prior Stroke/CVA/TIA.  His last INR was 1.3.  Anticoagulation responsible provider: Jens Som MD, Arlys John.  INR POC: 2.2.  Cuvette Lot#: [id number].  Exp: 06/2010.    Anticoagulation Management Assessment/Plan:      The patient's current anticoagulation dose is Warfarin sodium 5 mg tabs: Use as directed by Anticoagulation Clinic.  The target INR is 2.0-3.0.  The next INR is due 04/03/2009.  Anticoagulation instructions were given to patient.  Results were reviewed/authorized by Jeralene Peters, PharmD.  He was notified by Jeralene Peters.         Prior Anticoagulation Instructions: INR 2.4  Continue on same dosage 1.5 tablets daily except 2 tablets on Mondays, Wednesdays, and Fridays.   Recheck in 1 week.    Current Anticoagulation Instructions: INR 2.2  Take 2 tabs for next 3 days then resume 2 tabs on Monday, Wednesday, Friday and 1.5 tabs on all other days.  Recheck in 2 weeks.      Appended Document: rov/ewj LMOM for call back to set up cardioversion.  Appended Document: rov/ewj  Patient called back...outpatient cardioversion set up for 04/03/2009 9 am arrival for 11 am case with Dr.Ross ...booking # [id number]. All instructions given to Mr. Margo Aye and  he verbalized understanding.

## 2010-04-06 NOTE — Medication Information (Signed)
Summary: rov/tm  Anticoagulant Therapy  Managed by: Weston Brass, PharmD Referring MD: Tenny Craw MD, Gunnar Fusi PCP: [name] MD Supervising MD: Ladona Ridgel MD, Sharlot Gowda Indication 1: Atrial Fibrillation Lab Used: [facility] [place] Site: [address] INR POC 2.4 INR RANGE 2.0-3.0  Dietary changes: no    Health status changes: no    Bleeding/hemorrhagic complications: no    Recent/future hospitalizations: no    Any changes in medication regimen? no    Recent/future dental: no  Any missed doses?: no       Is patient compliant with meds? yes       Allergies: 1)  Penicillin V Potassium (Penicillin V Potassium)  Anticoagulation Management History:      The patient is taking warfarin and comes in today for a routine follow up visit.  The patient is on warfarin for chronic atrial fibrillation.  Negative risk factors for bleeding include an age less than 29 years old, no history of CVA/TIA, no history of GI bleeding, and absence of serious comorbidities.  The bleeding index is 'low risk'.  Positive CHADS2 values include History of HTN.  Negative CHADS2 values include History of CHF, Age > 73 years old, History of Diabetes, and Prior Stroke/CVA/TIA.  His last INR was 1.3.  Anticoagulation responsible provider: Ladona Ridgel MD, Sharlot Gowda.  INR POC: 2.4.  Cuvette Lot#: [id number].  Exp: 10/2010.    Anticoagulation Management Assessment/Plan:      The patient's current anticoagulation dose is Warfarin sodium 5 mg tabs: Use as directed by Anticoagulation Clinic.  The target INR is 2.0-3.0.  The next INR is due 08/31/2009.  Anticoagulation instructions were given to patient.  Results were reviewed/authorized by Weston Brass, PharmD.  He was notified by Weston Brass PharmD.         Prior Anticoagulation Instructions: INR 2.3 Continue 10mg  everyday except 7.5mg s on Tuesdays, Thursdays and Sundays. Recheck in 4 weeks.   Current Anticoagulation Instructions: INR 2.4  Continue same dose of 2 tablets  every day except 1 1/2 tablets on Sunday, Tuesday and Thursday

## 2010-04-06 NOTE — Progress Notes (Signed)
Summary: refill amlodipine  Phone Note Refill Request Message from:  Fax from Pharmacy on January 20, 2010 12:31 PM  Refills Requested: Medication #1:  NORVASC 10 MG TABS Take 1 tablet by mouth once a day   Last Refilled: 10/19/2009 [facility]   Method Requested: Fax to Local Pharmacy Initial call taken by: Pura Spice, RN,  January 20, 2010 12:33 PM    Prescriptions: NORVASC 10 MG TABS (AMLODIPINE BESYLATE) Take 1 tablet by mouth once a day  #30 Tablet x 5   Entered by:   Pura Spice, RN   Authorized by:   [name] MD   Signed by:   Pura Spice, RN on 01/20/2010   Method used:   Faxed to ...       [facility]* (retail)       [address] [place], [place]  [address]       Ph: [phone]       Fax: [phone]   RxID:   [id number]

## 2010-04-06 NOTE — Medication Information (Signed)
Summary: [name]/LB  Anticoagulant Therapy  Managed by: Cloyde Reams, RN, BSN Referring MD: Tenny Craw MD, Gunnar Fusi PCP: [name] MD Supervising MD: Riley Kill MD, Maisie Fus Indication 1: Atrial Fibrillation Lab Used: [facility] [place] Site: [address] INR POC 2.1 INR RANGE 2.0-3.0  Dietary changes: no    Health status changes: no    Bleeding/hemorrhagic complications: no    Recent/future hospitalizations: no    Any changes in medication regimen? no    Recent/future dental: no  Any missed doses?: no       Is patient compliant with meds? yes       Allergies (verified): 1)  Penicillin V Potassium (Penicillin V Potassium)  Anticoagulation Management History:      The patient is taking warfarin and comes in today for a routine follow up visit.  Warfarin therapy is being given due to chronic atrial fibrillation.  Negative risk factors for bleeding include an age less than 91 years old, no history of CVA/TIA, no history of GI bleeding, and absence of serious comorbidities.  The bleeding index is 'low risk'.  Positive CHADS2 values include History of HTN.  Negative CHADS2 values include History of CHF, Age > 31 years old, History of Diabetes, and Prior Stroke/CVA/TIA.  His last INR was 1.3.  Anticoagulation responsible provider: Riley Kill MD, Maisie Fus.  INR POC: 2.1.  Cuvette Lot#: [id number].  Exp: 04/2010.    Anticoagulation Management Assessment/Plan:      The patient's current anticoagulation dose is Warfarin sodium 5 mg tabs: Use as directed by Anticoagulation Clinic.  The target INR is 2.0-3.0.  The next INR is due 03/16/2009.  Anticoagulation instructions were given to patient.  Results were reviewed/authorized by Cloyde Reams, RN, BSN.  He was notified by Cloyde Reams RN.         Prior Anticoagulation Instructions: INR 2.1  Continue 1.5 tabs daily (7.5 mg) except 2 tabs (10 mg) on Mondays and Fridays.   Recheck on January 3rd at 3:45 pm.   Current Anticoagulation  Instructions: INR 2.1  Start taking 1.5 tablets daily except 2 tablets on Mondays, Wednesdays and Fridays.  Pt pending cardioversion.  Weekly checks x 4 weeks.    Appended Document: [name]/LB    Prescriptions: WARFARIN SODIUM 5 MG TABS (WARFARIN SODIUM) Use as directed by Anticoagulation Clinic  #60 x 2   Entered by:   Cloyde Reams RN   Authorized by:   Sherrill Raring, MD, [facility]   Signed by:   Cloyde Reams RN on 03/09/2009   Method used:   Electronically to        [facility] [phone]* (retail)       [address] [place], [place]  [address]       Ph: [phone] or [phone]       Fax: [phone]   RxID:   [id number]

## 2010-04-06 NOTE — Medication Information (Signed)
Summary: rov/sp  Anticoagulant Therapy  Managed by: Elaina Pattee, PharmD Referring MD: Tenny Craw MD, Gunnar Fusi PCP: [name] MD Supervising MD: Gala Romney MD, Reuel Boom Indication 1: Atrial Fibrillation Lab Used: [facility] [place] Site: [address] INR POC 2.3 INR RANGE 2.0-3.0  Dietary changes: no    Health status changes: no    Bleeding/hemorrhagic complications: no    Recent/future hospitalizations: no    Any changes in medication regimen? no    Recent/future dental: no  Any missed doses?: no       Is patient compliant with meds? yes       Allergies: 1)  Penicillin V Potassium (Penicillin V Potassium)  Anticoagulation Management History:      The patient is taking warfarin and comes in today for a routine follow up visit.  The patient is on warfarin for chronic atrial fibrillation.  Negative risk factors for bleeding include an age less than 15 years old, no history of CVA/TIA, no history of GI bleeding, and absence of serious comorbidities.  The bleeding index is 'low risk'.  Positive CHADS2 values include History of HTN.  Negative CHADS2 values include History of CHF, Age > 3 years old, History of Diabetes, and Prior Stroke/CVA/TIA.  His last INR was 1.3.  Anticoagulation responsible provider: Bensimhon MD, Reuel Boom.  INR POC: 2.3.  Cuvette Lot#: [id number].  Exp: 11/2010.    Anticoagulation Management Assessment/Plan:      The patient's current anticoagulation dose is Warfarin sodium 5 mg tabs: Use as directed by Anticoagulation Clinic.  The target INR is 2.0-3.0.  The next INR is due 09/28/2009.  Anticoagulation instructions were given to patient.  Results were reviewed/authorized by Elaina Pattee, PharmD.  He was notified by Elaina Pattee, PharmD.         Prior Anticoagulation Instructions: INR 2.4  Continue same dose of 2 tablets every day except 1 1/2 tablets on Sunday, Tuesday and Thursday  Current Anticoagulation Instructions: INR 2.3. Take 2 tablets  daily except 1.5 tablets on Tues, Thurs, Sun. Recheck in 4 weeks.

## 2010-04-06 NOTE — Progress Notes (Signed)
Summary: f/u on last office visit conversation  Phone Note Call from Patient Call back at Home Phone [phone] Call back at cell# [phone]   Caller: Patient Reason for Call: Talk to Nurse, Talk to Doctor Summary of Call: per your conversation at last office visit about Dr. Tenny Craw consulting with other MD...he hasn't heard anything and wanted make sure you didn't forget. Initial call taken by: Omer Jack,  May 08, 2009 4:50 PM  Follow-up for Phone Call        Called patient ...advised he needs to be set up for a TEE per Dr. Tenny Craw. He would like to be set up on 06/01/2009. Advised will call him back on Monday to book and confirm. Follow-up by: Suzan Garibaldi RN     Appended Document: f/u on last office visit conversation TEE scheduled for 3/28 10 am for 11am.. booking # [phone]. Instructions reviewed and mailed to patient.

## 2010-04-06 NOTE — Medication Information (Signed)
Summary: rov/cb  Anticoagulant Therapy  Managed by: Bethena Midget, RN, BSN Referring MD: Tenny Craw MD, Gunnar Fusi PCP: [name] MD Supervising MD: Riley Kill MD, Maisie Fus Indication 1: Atrial Fibrillation Lab Used: [facility] [place] Site: [address] INR POC 2.3 INR RANGE 2.0-3.0  Dietary changes: no    Health status changes: no    Bleeding/hemorrhagic complications: no    Recent/future hospitalizations: no    Any changes in medication regimen? no    Recent/future dental: no  Any missed doses?: no       Is patient compliant with meds? yes       Allergies: 1)  Penicillin V Potassium (Penicillin V Potassium)  Anticoagulation Management History:      The patient is taking warfarin and comes in today for a routine follow up visit.  The patient is on warfarin for chronic atrial fibrillation.  Negative risk factors for bleeding include an age less than 46 years old, no history of CVA/TIA, no history of GI bleeding, and absence of serious comorbidities.  The bleeding index is 'low risk'.  Positive CHADS2 values include History of HTN.  Negative CHADS2 values include History of CHF, Age > 72 years old, History of Diabetes, and Prior Stroke/CVA/TIA.  His last INR was 1.3.  Anticoagulation responsible provider: Riley Kill MD, Maisie Fus.  INR POC: 2.3.  Cuvette Lot#: [id number].  Exp: 12/2010.    Anticoagulation Management Assessment/Plan:      The patient's current anticoagulation dose is Warfarin sodium 5 mg tabs: Use as directed by Anticoagulation Clinic.  The target INR is 2.0-3.0.  The next INR is due 10/26/2009.  Anticoagulation instructions were given to patient.  Results were reviewed/authorized by Bethena Midget, RN, BSN.  He was notified by Bethena Midget, RN, BSN.         Prior Anticoagulation Instructions: INR 2.3. Take 2 tablets daily except 1.5 tablets on Tues, Thurs, Sun. Recheck in 4 weeks.  Current Anticoagulation Instructions: INR 2.3 Continue 10mg s everyday except  7.5mg s on Tuesdays, Thursdays and Sundays. REcheck in 4 weeks.

## 2010-04-06 NOTE — Medication Information (Signed)
Summary: rov/tm  Anticoagulant Therapy  Managed by: Cloyde Reams, RN, BSN Referring MD: Tenny Craw MD, Gunnar Fusi PCP: [name] MD Supervising MD: Gala Romney MD, Reuel Boom Indication 1: Atrial Fibrillation Lab Used: [facility] [place] Site: [address] INR POC 2.3 INR RANGE 2.0-3.0  Dietary changes: no    Health status changes: no    Bleeding/hemorrhagic complications: no    Recent/future hospitalizations: no    Any changes in medication regimen? no    Recent/future dental: no  Any missed doses?: no       Is patient compliant with meds? yes       Allergies: 1)  Penicillin V Potassium (Penicillin V Potassium)  Anticoagulation Management History:      The patient is taking warfarin and comes in today for a routine follow up visit.  He is being anticoagulated because of chronic atrial fibrillation.  Negative risk factors for bleeding include an age less than 45 years old, no history of CVA/TIA, no history of GI bleeding, and absence of serious comorbidities.  The bleeding index is 'low risk'.  Positive CHADS2 values include History of HTN.  Negative CHADS2 values include History of CHF, Age > 103 years old, History of Diabetes, and Prior Stroke/CVA/TIA.  His last INR was 1.3.  Anticoagulation responsible provider: Bensimhon MD, Reuel Boom.  INR POC: 2.3.  Cuvette Lot#: [id number].  Exp: 01/2011.    Anticoagulation Management Assessment/Plan:      The patient's current anticoagulation dose is Warfarin sodium 5 mg tabs: Use as directed by Anticoagulation Clinic.  The target INR is 2.0-3.0.  The next INR is due 01/18/2010.  Anticoagulation instructions were given to patient.  Results were reviewed/authorized by Cloyde Reams, RN, BSN.  He was notified by Cloyde Reams RN.         Prior Anticoagulation Instructions: INR 2.3 Continue 10mg s everyday except 7.5mg s on Sundays, Tuesdays and Thursdays. Recheck in 4 weeks.   Current Anticoagulation Instructions: INR 2.3  Continue on  same dosage 10mg  daily except 7.5mg  on Sundays, Tuesdays, and Thursdays.  Recheck in 4 weeks.

## 2010-04-06 NOTE — Letter (Signed)
Summary: New Patient letter  [facility]  [address] [place], [place] [address]   Phone: [phone]  Fax: [phone]       11/13/2009 MRN: [id number]  [facility] [address] [place], [place]  [address]  Dear [name],  Welcome to the Gastroenterology Division at [facility].    You are scheduled to see Dr.  Claudette Head on November 19, 2011at 3:00am on the 3rd floor at [facility], [address] [employer].  We ask that you try to arrive at our office 15 minutes prior to your appointment time to allow for check-in.  We would like you to complete the enclosed self-administered evaluation form prior to your visit and bring it with you on the day of your appointment.  We will review it with you.  Also, please bring a complete list of all your medications or, if you prefer, bring the medication bottles and we will list them.  Please bring your insurance card so that we may make a copy of it.  If your insurance requires a referral to see a specialist, please bring your referral form from your primary care physician.  Co-payments are due at the time of your visit and may be paid by cash, check or credit card.     Your office visit will consist of a consult with your physician (includes a physical exam), any laboratory testing he/she may order, scheduling of any necessary diagnostic testing (e.g. x-ray, ultrasound, CT-scan), and scheduling of a procedure (e.g. Endoscopy, Colonoscopy) if required.  Please allow enough time on your schedule to allow for any/all of these possibilities.    If you cannot keep your appointment, please call [phone] to cancel or reschedule prior to your appointment date.  This allows [place] the opportunity to schedule an appointment for another patient in need of care.  If you do not cancel or reschedule by 5 p.m. the business day prior to your appointment date, you will be charged a $50.00 late cancellation/no-show fee.    Thank you  for choosing [facility] for your medical needs.  We appreciate the opportunity to care for you.  Please visit [place] at our website  to learn more about our practice.                     Sincerely,                                                             The Gastroenterology Division

## 2010-04-06 NOTE — Medication Information (Signed)
Summary: rov/ej  Anticoagulant Therapy  Managed by: Earvin Hansen, PharmD Referring MD: Tenny Craw MD, Gunnar Fusi PCP: [name] MD Supervising MD: Gala Romney MD, Reuel Boom Indication 1: Atrial Fibrillation Lab Used: [facility] [place] Site: [address] INR POC 2.5 INR RANGE 2.0-3.0  Dietary changes: no    Health status changes: no    Bleeding/hemorrhagic complications: no    Recent/future hospitalizations: no    Any changes in medication regimen? no    Recent/future dental: no  Any missed doses?: no       Is patient compliant with meds? yes       Allergies: 1)  Penicillin V Potassium (Penicillin V Potassium)  Anticoagulation Management History:      The patient is taking warfarin and comes in today for a routine follow up visit.  Warfarin therapy is being given due to chronic atrial fibrillation.  Negative risk factors for bleeding include an age less than 30 years old, no history of CVA/TIA, no history of GI bleeding, and absence of serious comorbidities.  The bleeding index is 'low risk'.  Positive CHADS2 values include History of HTN.  Negative CHADS2 values include History of CHF, Age > 16 years old, History of Diabetes, and Prior Stroke/CVA/TIA.  His last INR was 1.3.  Anticoagulation responsible provider: Bensimhon MD, Reuel Boom.  INR POC: 2.5.  Exp: 01/2011.    Anticoagulation Management Assessment/Plan:      The patient's current anticoagulation dose is Warfarin sodium 5 mg tabs: Use as directed by Anticoagulation Clinic.  The target INR is 2.0-3.0.  The next INR is due 02/15/2010.  Anticoagulation instructions were given to patient.  Results were reviewed/authorized by Earvin Hansen, PharmD.  He was notified by Earvin Hansen PharmD.         Prior Anticoagulation Instructions: INR 2.3  Continue on same dosage 10mg  daily except 7.5mg  on Sundays, Tuesdays, and Thursdays.  Recheck in 4 weeks.    Current Anticoagulation Instructions: INR=2.5.  Continue current regimen of 10 mg (2 tablets) on Mon, Wed, Fri, and Sat and then 7.5 mg (1.5 tablets) on Sun, Tues, and Thurs.

## 2010-04-06 NOTE — Medication Information (Signed)
Summary: rov/tm  Anticoagulant Therapy  Managed by: Bethena Midget, RN, BSN Referring MD: Tenny Craw MD, Gunnar Fusi PCP: [name] MD Supervising MD: Riley Kill MD, Maisie Fus Indication 1: Atrial Fibrillation Lab Used: [facility] [place] Site: [address] INR POC 2.3 INR RANGE 2.0-3.0  Dietary changes: no    Health status changes: no    Bleeding/hemorrhagic complications: no    Recent/future hospitalizations: no    Any changes in medication regimen? no    Recent/future dental: no  Any missed doses?: no       Is patient compliant with meds? yes       Allergies: 1)  Penicillin V Potassium (Penicillin V Potassium)  Anticoagulation Management History:      The patient is taking warfarin and comes in today for a routine follow up visit.  The patient is on warfarin for chronic atrial fibrillation.  Negative risk factors for bleeding include an age less than 32 years old, no history of CVA/TIA, no history of GI bleeding, and absence of serious comorbidities.  The bleeding index is 'low risk'.  Positive CHADS2 values include History of HTN.  Negative CHADS2 values include History of CHF, Age > 6 years old, History of Diabetes, and Prior Stroke/CVA/TIA.  His last INR was 1.3.  Anticoagulation responsible provider: Riley Kill MD, Maisie Fus.  INR POC: 2.3.  Cuvette Lot#: [id number].  Exp: 08/2010.    Anticoagulation Management Assessment/Plan:      The patient's current anticoagulation dose is Warfarin sodium 5 mg tabs: Use as directed by Anticoagulation Clinic.  The target INR is 2.0-3.0.  The next INR is due 08/04/2009.  Anticoagulation instructions were given to patient.  Results were reviewed/authorized by Bethena Midget, RN, BSN.  He was notified by Bethena Midget, RN, BSN.         Prior Anticoagulation Instructions: INR 1.9 Change dose to 10mg s daily except 7.5mg s on Tuesdays, Thursdays and Sundays. Recheck in 3 weeks.  Per S. Putt, Pharm D  Current Anticoagulation Instructions: INR  2.3 Continue 10mg  everyday except 7.5mg s on Tuesdays, Thursdays and Sundays. Recheck in 4 weeks.

## 2010-04-06 NOTE — Medication Information (Signed)
Summary: rov/tm  Anticoagulant Therapy  Managed by: Weston Brass, PharmD Referring MD: Tenny Craw MD, Gunnar Fusi PCP: [name] MD Supervising MD: Antoine Poche MD, Fayrene Fearing Indication 1: Atrial Fibrillation Lab Used: [facility] [place] Site: [address] INR POC 2.4 INR RANGE 2.0-3.0  Dietary changes: no    Health status changes: no    Bleeding/hemorrhagic complications: no    Recent/future hospitalizations: no    Any changes in medication regimen? no    Recent/future dental: no   Is patient compliant with meds? yes       Allergies: 1)  Penicillin V Potassium (Penicillin V Potassium)  Anticoagulation Management History:      The patient is taking warfarin and comes in today for a routine follow up visit.  He is being anticoagulated due to chronic atrial fibrillation.  Negative risk factors for bleeding include an age less than 12 years old, no history of CVA/TIA, no history of GI bleeding, and absence of serious comorbidities.  The bleeding index is 'low risk'.  Positive CHADS2 values include History of HTN.  Negative CHADS2 values include History of CHF, Age > 34 years old, History of Diabetes, and Prior Stroke/CVA/TIA.  His last INR was 1.3.  Anticoagulation responsible provider: Antoine Poche MD, Fayrene Fearing.  INR POC: 2.4.  Cuvette Lot#: [id number].  Exp: 12/2010.    Anticoagulation Management Assessment/Plan:      The patient's current anticoagulation dose is Warfarin sodium 5 mg tabs: Use as directed by Anticoagulation Clinic.  The target INR is 2.0-3.0.  The next INR is due 11/23/2009.  Anticoagulation instructions were given to patient.  Results were reviewed/authorized by Weston Brass, PharmD.  He was notified by Liana Gerold, PharmD Candidate.         Prior Anticoagulation Instructions: INR 2.3 Continue 10mg s everyday except 7.5mg s on Tuesdays, Thursdays and Sundays. REcheck in 4 weeks.   Current Anticoagulation Instructions: INR 2.4  Continue 2 tablets daily except 1.5  tablets Sun, Tue and Thu.  Return to clinic in 4 weeks.

## 2010-04-06 NOTE — Letter (Signed)
Summary: Colonoscopy Letter  [facility]  [address] [place], [place] [address]   Phone: [phone]  Fax: [phone]      October 15, 2009 MRN: [id number]   [facility] [address] [place], [place]  [address]   Dear Mr. Prowse,   According to your medical record, it is time for you to schedule a Colonoscopy. The American Cancer Society recommends this procedure as a method to detect early colon cancer. Patients with a family history of colon cancer, or a personal history of colon polyps or inflammatory bowel disease are at increased risk.  This letter has beeen generated based on the recommendations made at the time of your procedure. If you feel that in your particular situation this may no longer apply, please contact our office.  Please call our office at [phone] to schedule this appointment or to update your records at your earliest convenience.  Thank you for cooperating with [place] to provide you with the very best care possible.   Sincerely,  Judie Petit T. Russella Dar, M.D.  [facility] [phone]

## 2010-04-06 NOTE — Medication Information (Signed)
Summary: rovmp  Anticoagulant Therapy  Managed by: Bethena Midget, RN, BSN Referring MD: Tenny Craw MD, Gunnar Fusi PCP: [name] MD Supervising MD: Graciela Husbands MD, Viviann Spare Indication 1: Atrial Fibrillation Lab Used: [facility] [place] Site: [address] INR POC 2.2 INR RANGE 2.0-3.0  Dietary changes: no    Health status changes: no    Bleeding/hemorrhagic complications: no    Recent/future hospitalizations: no    Any changes in medication regimen? no    Recent/future dental: no  Any missed doses?: no       Is patient compliant with meds? yes       Allergies: 1)  Penicillin V Potassium (Penicillin V Potassium)  Anticoagulation Management History:      The patient is taking warfarin and comes in today for a routine follow up visit.  Anticoagulation is being administered due to chronic atrial fibrillation.  Negative risk factors for bleeding include an age less than 32 years old, no history of CVA/TIA, no history of GI bleeding, and absence of serious comorbidities.  The bleeding index is 'low risk'.  Positive CHADS2 values include History of HTN.  Negative CHADS2 values include History of CHF, Age > 41 years old, History of Diabetes, and Prior Stroke/CVA/TIA.  His last INR was 1.3.  Anticoagulation responsible provider: Graciela Husbands MD, Viviann Spare.  INR POC: 2.2.  Cuvette Lot#: [id number].  Exp: 06/2010.    Anticoagulation Management Assessment/Plan:      The patient's current anticoagulation dose is Warfarin sodium 5 mg tabs: Use as directed by Anticoagulation Clinic.  The target INR is 2.0-3.0.  The next INR is due 04/27/2009.  Anticoagulation instructions were given to patient.  Results were reviewed/authorized by Bethena Midget, RN, BSN.  He was notified by Bethena Midget, RN, BSN.         Prior Anticoagulation Instructions: INR 2.2  Take 2 tabs for next 3 days then resume 2 tabs on Monday, Wednesday, Friday and 1.5 tabs on all other days.  Recheck in 2 weeks.      Current  Anticoagulation Instructions: INR 2.2 Continue 7.5mg s daily except 10mg s on Mondays, Wednesdays and Fridays. Recheck in 3 weeks.

## 2010-04-06 NOTE — Assessment & Plan Note (Addendum)
Summary: rov/sl   Visit Type:  Follow-up Referring Provider:  [name] Primary Provider:  [name] MD  CC:  no complaints.  History of Present Illness: Patient is a 60 year old with a history of mitral regurgitation (mild by TEE with mild prolapse of the posterior mitral leaflet, atrial fibrillation, hypertension, sleep apnea and obesity. I saw him in clinic last spring.  he has been seen by Claris Che since.  Lipid panel showed LDL of 76, HDL of 34. since I saw him last he has been doing well.  Denies SOB, CP, palpitations, dizziness.  Active at work.  Using CPAP for most of night.    Current Medications (verified): 1)  Clonidine Hcl 0.1 Mg Tabs (Clonidine Hcl) .... Take 1 Tablet By Mouth Twice A Day 2)  Norvasc 10 Mg Tabs (Amlodipine Besylate) .... Take 1 Tablet By Mouth Once A Day 3)  Simvastatin 40 Mg Tabs (Simvastatin) .Marland Kitchen.. 1 By Mouth Once Daily 4)  Lisinopril 20 Mg Tabs (Lisinopril) .... 2 Tabs Once Daily 5)  Fish Oil   Oil (Fish Oil) .... Once Daily 6)  Warfarin Sodium 5 Mg Tabs (Warfarin Sodium) .... Use As Directed By Anticoagulation Clinic 7)  Aspirin 81 Mg Tbec (Aspirin) .... Take One Tablet By Mouth Daily 8)  Carvedilol 25 Mg Tabs (Carvedilol) .... Take One Tablet By Mouth Twice A Day 9)  Hydrochlorothiazide 12.5 Mg Tabs (Hydrochlorothiazide) .... Once Daily  Allergies (verified): 1)  Penicillin V Potassium (Penicillin V Potassium)  Past History:  Past medical, surgical, family and social histories (including risk factors) reviewed, and no changes noted (except as noted below).  Past Medical History:  MITRAL REGURGITATION (ICD-424.0) ATRIAL FIBRILLATION (ICD-427.31), sees Dr. [name] HYPERTENSION (ICD-401.9) DYSLIPIDEMIA (ICD-272.4) PLANTAR FASCIITIS (ICD-728.71) OBESITY (ICD-278.00) EXTERNAL HEMORRHOIDS (ICD-455.3) INTERNAL HEMORRHOIDS (ICD-455.0 ADENOMATOUS COLONIC POLYP (ICD-211.3) RENAL CALCULUS, HX OF (ICD-V13.01) HYPOKALEMIA, HX OF (ICD-V12.2) Sleep  apnea, sees Dr. Osborne Oman  Past Surgical History: Reviewed history from 08/18/2008 and no changes required. Denies surgical history colonoscopy 11-14-06 per Dr. Russella Dar, benign polyps, repeat 3 yrs  Family History: Reviewed history from 11/17/2008 and no changes required. Family History of CAD Male 1st degree relative <50 Family History Hypertension    heart disease: father   Social History: Reviewed history from 11/17/2008 and no changes required. Single Never Smoked Alcohol use-yes Drug use-no pt works as asst Theatre stage manager.    Vital Signs:  Patient profile:   60 year old male Height:      72 inches Weight:      289 pounds BMI:     39.34 Pulse rate:   59 / minute BP sitting:   129 / 83  (left arm) Cuff size:   large  Vitals Entered By: Burnett Kanaris, CNA (November 30, 2009 8:28 AM)  Physical Exam  Additional Exam:  Patient is an obese 60 year old in NAD HEENT:  Normocephalic, atraumatic. EOMI, PERRLA.  Neck: JVP is normal. No thyromegaly. No bruits.  Lungs: clear to auscultation. No rales no wheezes.  Heart: Regular rate and rhythm. Normal S1, S2. No S3.   Gr II/Vi systolic murmur L sternal border to base.   PMI not displaced.  Abdomen:  Supple, nontender. Normal bowel sounds. No masses. No hepatomegaly.  Extremities:   Good distal pulses throughout. No lower extremity edema.  Musculoskeletal :moving all extremities.  Neuro:   alert and oriented x3.    EKG  Procedure date:  11/30/2009  Findings:  Atrial fibrillation  56 bpm.    Impression & Recommendations:  Problem # 1:  ATRIAL FIBRILLATION (ICD-427.31) Rates remain controlled.  Patient is asymptomatic.  Would continue regimen.  Problem # 2:  MITRAL REGURGITATION, MODERATE (ICD-424.0) TEE earlier this year showed MR was not severe, more mild.  Will rereview previous studies.  No other changes for now.  Would repeat echo in spring.  Problem # 3:  HYPERTENSION (ICD-401.9) Continue  meds.  Problem # 4:  OBSTRUCTIVE SLEEP APNEA (ICD-327.23) Conitnues to use CPAP  Other Orders: EKG w/ Interpretation (93000)  Patient Instructions: 1)  Your physician recommends that you schedule a follow-up appointment in: 6 months

## 2010-04-06 NOTE — Medication Information (Signed)
Summary: rov/eac  Anticoagulant Therapy  Managed by: Cloyde Reams, RN, BSN Referring MD: Tenny Craw MD, Gunnar Fusi PCP: [name] MD Supervising MD: Shirlee Latch MD, Quinlynn Cuthbert Indication 1: Atrial Fibrillation Lab Used: [facility] [place] Site: [address] INR POC 2.3 INR RANGE 2.0-3.0  Dietary changes: yes       Details: Incr salad intake anticipated for warmer months.    Health status changes: no    Bleeding/hemorrhagic complications: no    Recent/future hospitalizations: no    Any changes in medication regimen? no    Recent/future dental: no  Any missed doses?: no       Is patient compliant with meds? yes       Allergies (verified): 1)  Penicillin V Potassium (Penicillin V Potassium)  Anticoagulation Management History:      The patient is taking warfarin and comes in today for a routine follow up visit.  The patient is taking warfarin for chronic atrial fibrillation.  Negative risk factors for bleeding include an age less than 56 years old, no history of CVA/TIA, no history of GI bleeding, and absence of serious comorbidities.  The bleeding index is 'low risk'.  Positive CHADS2 values include History of HTN.  Negative CHADS2 values include History of CHF, Age > 6 years old, History of Diabetes, and Prior Stroke/CVA/TIA.  His last INR was 1.3.  Anticoagulation responsible provider: Shirlee Latch MD, Tonique Mendonca.  INR POC: 2.3.  Cuvette Lot#: [id number].  Exp: 07/2010.    Anticoagulation Management Assessment/Plan:      The patient's current anticoagulation dose is Warfarin sodium 5 mg tabs: Use as directed by Anticoagulation Clinic.  The target INR is 2.0-3.0.  The next INR is due 06/22/2009.  Anticoagulation instructions were given to patient.  Results were reviewed/authorized by Cloyde Reams, RN, BSN.  He was notified by Cloyde Reams RN.         Prior Anticoagulation Instructions: INR 2.4  Continue current dosing schedule.  Take 2 tablets on monday, Wednesday, and Friday and  take 1.5 tablets all other days.  Return to clinic in 4 weeks.  Current Anticoagulation Instructions: INR 2.3  Continue on same dosage 1.5 tablets daily except 2 tablets on MOndays, Wednesdays and Fridays.  Recheck in 4 weeks.

## 2010-04-06 NOTE — Letter (Signed)
Summary: Anticoagulation Modification Letter  [facility]  [address] [place], [place] [address]   Phone: [phone]  Fax: [phone]    January 20, 2010  Re:    [name] DOB:    09/15/1950 MRN:    [id number]    Dear Dr. Tenny Craw,  We have scheduled the above patient for an endoscopic procedure. Our records show that  he/she is on anticoagulation therapy. Please advise as to how long the patient may come off their therapy of warfarin prior to the scheduled procedure(s) on 03/16/10.   Please fax back/or route the completed form to [facility] at [phone].  Thank you for your help with this matter.  Sincerely,  Christie Nottingham CMA Duncan Dull)   Physician Recommendation:  Hold Plavix 7 days prior ________________  Hold Coumadin 5 days prior ____________  Other ______________________________     Appended Document: Anticoagulation Modification Letter patient OK to come off coumadin for 5 days prior to procedure.  Resume after  Appended Document: Anticoagulation Modification Letter Pt notified to come off coumadin 5 days before your procedure and pt verbalized understanding.

## 2010-04-06 NOTE — Assessment & Plan Note (Signed)
Summary: fup on meds//ccm   Vital Signs:  Patient profile:   60 year old male Weight:      294 pounds BMI:     40.02 Pulse rate:   52 / minute Pulse rhythm:   irregular BP sitting:   120 / 78  (left arm) Cuff size:   large  Vitals Entered By: Raechel Ache, RN (October 29, 2009 9:40 AM) CC: [name], needs refills   History of Present Illness: Here to follow up on numerous issues. he feels fine in general. He tries to walk daily if it is not too hot outside. He saw Dr. Tenny Craw in April and had a TEE, which showed his mitral valve to be stable and his LVF to be adequate. He has a colonoscopy coming up in a few months.   Allergies: 1)  Penicillin V Potassium (Penicillin V Potassium)  Past History:  Past Medical History:  MITRAL REGURGITATION, MODERATE (ICD-424.0) ATRIAL FIBRILLATION (ICD-427.31), sees Dr. [name] HYPERTENSION (ICD-401.9) DYSLIPIDEMIA (ICD-272.4) PLANTAR FASCIITIS (ICD-728.71) OBESITY (ICD-278.00) EXTERNAL HEMORRHOIDS (ICD-455.3) INTERNAL HEMORRHOIDS (ICD-455.0 ADENOMATOUS COLONIC POLYP (ICD-211.3) RENAL CALCULUS, HX OF (ICD-V13.01) HYPOKALEMIA, HX OF (ICD-V12.2) Sleep apnea, sees Dr. Osborne Oman  Past Surgical History: Reviewed history from 08/18/2008 and no changes required. Denies surgical history colonoscopy 11-14-06 per Dr. Russella Dar, benign polyps, repeat 3 yrs  Review of Systems  The patient denies anorexia, fever, weight loss, weight gain, vision loss, decreased hearing, hoarseness, chest pain, syncope, dyspnea on exertion, peripheral edema, prolonged cough, headaches, hemoptysis, abdominal pain, melena, hematochezia, severe indigestion/heartburn, hematuria, incontinence, genital sores, muscle weakness, suspicious skin lesions, transient blindness, difficulty walking, depression, unusual weight change, abnormal bleeding, enlarged lymph nodes, angioedema, breast masses, and testicular masses.    Physical Exam  General:  overweight-appearing.   Neck:  No  deformities, masses, or tenderness noted. Lungs:  Normal respiratory effort, chest expands symmetrically. Lungs are clear to auscultation, no crackles or wheezes. Heart:  Irregularly irregular rhythm. normal rate, no gallop, no rub, and no JVD.  Has his usual 2/6 SM over the mitral area   Impression & Recommendations:  Problem # 1:  MITRAL REGURGITATION, MODERATE (ICD-424.0)  His updated medication list for this problem includes:    Warfarin Sodium 5 Mg Tabs (Warfarin sodium) ..... Use as directed by anticoagulation clinic    Aspirin 81 Mg Tbec (Aspirin) .Marland Kitchen... Take one tablet by mouth daily    Carvedilol 25 Mg Tabs (Carvedilol) .Marland Kitchen... Take one tablet by mouth twice a day  Problem # 2:  ATRIAL FIBRILLATION (ICD-427.31)  His updated medication list for this problem includes:    Norvasc 10 Mg Tabs (Amlodipine besylate) .Marland Kitchen... Take 1 tablet by mouth once a day    Warfarin Sodium 5 Mg Tabs (Warfarin sodium) ..... Use as directed by anticoagulation clinic    Aspirin 81 Mg Tbec (Aspirin) .Marland Kitchen... Take one tablet by mouth daily    Carvedilol 25 Mg Tabs (Carvedilol) .Marland Kitchen... Take one tablet by mouth twice a day  Problem # 3:  HYPERTENSION (ICD-401.9)  The following medications were removed from the medication list:    Hydrochlorothiazide 25 Mg Tabs (Hydrochlorothiazide) .Marland Kitchen... 1/2  by mouth once daily His updated medication list for this problem includes:    Clonidine Hcl 0.1 Mg Tabs (Clonidine hcl) .Marland Kitchen... Take 1 tablet by mouth twice a day    Norvasc 10 Mg Tabs (Amlodipine besylate) .Marland Kitchen... Take 1 tablet by mouth once a day    Lisinopril 20 Mg Tabs (Lisinopril) .Marland Kitchen... 2 tabs once daily  Carvedilol 25 Mg Tabs (Carvedilol) .Marland Kitchen... Take one tablet by mouth twice a day    Hydrochlorothiazide 12.5 Mg Tabs (Hydrochlorothiazide) ..... Once daily  Problem # 4:  DYSLIPIDEMIA (ICD-272.4)  His updated medication list for this problem includes:    Simvastatin 40 Mg Tabs (Simvastatin) .Marland Kitchen... 1 by mouth once  daily  Complete Medication List: 1)  Clonidine Hcl 0.1 Mg Tabs (Clonidine hcl) .... Take 1 tablet by mouth twice a day 2)  Norvasc 10 Mg Tabs (Amlodipine besylate) .... Take 1 tablet by mouth once a day 3)  Simvastatin 40 Mg Tabs (Simvastatin) .Marland Kitchen.. 1 by mouth once daily 4)  Lisinopril 20 Mg Tabs (Lisinopril) .... 2 tabs once daily 5)  Fish Oil Oil (Fish oil) .... Once daily 6)  Warfarin Sodium 5 Mg Tabs (Warfarin sodium) .... Use as directed by anticoagulation clinic 7)  Aspirin 81 Mg Tbec (Aspirin) .... Take one tablet by mouth daily 8)  Carvedilol 25 Mg Tabs (Carvedilol) .... Take one tablet by mouth twice a day 9)  Hydrochlorothiazide 12.5 Mg Tabs (Hydrochlorothiazide) .... Once daily  Patient Instructions: 1)  He will set up fasting labs soon Prescriptions: SIMVASTATIN 40 MG TABS (SIMVASTATIN) 1 by mouth once daily  #30 x 11   Entered and Authorized by:   [name] MD   Signed by:   [name] MD on 10/29/2009   Method used:   Electronically to        [facility]* (retail)       [address] [place], [place]  [address]       Ph: [phone]       Fax: [phone]   RxID:   [id number] HYDROCHLOROTHIAZIDE 12.5 MG TABS (HYDROCHLOROTHIAZIDE) once daily  #30 x 11   Entered and Authorized by:   [name] MD   Signed by:   [name] MD on 10/29/2009   Method used:   Electronically to        [facility]* (retail)       [address] [place], [place]  [address]       Ph: [phone]       Fax: [phone]   RxID:   [id number] CLONIDINE HCL 0.1 MG TABS (CLONIDINE HCL) Take 1 tablet by mouth twice a day  #60 x 11   Entered and Authorized by:   [name] MD   Signed by:   [name] MD on 10/29/2009   Method used:   Electronically to        [facility]* (retail)       [address] [place], [place]  [address]       Ph: [phone]       Fax: [phone]   RxID:   [id number]

## 2010-04-06 NOTE — Medication Information (Signed)
Summary: rov/ewj  Anticoagulant Therapy  Managed by: Bethena Midget, RN, BSN Referring MD: Tenny Craw MD, Gunnar Fusi PCP: [name] MD Supervising MD: Juanda Chance MD, Dashawn Golda Indication 1: Atrial Fibrillation Lab Used: [facility] [place] Site: [address] INR POC 1.9 INR RANGE 2.0-3.0  Dietary changes: yes       Details: Eating more salads per week.  Health status changes: no    Bleeding/hemorrhagic complications: no    Recent/future hospitalizations: no    Any changes in medication regimen? no    Recent/future dental: no  Any missed doses?: no       Is patient compliant with meds? yes       Allergies: 1)  Penicillin V Potassium (Penicillin V Potassium)  Anticoagulation Management History:      The patient is taking warfarin and comes in today for a routine follow up visit.  The patient is taking warfarin for chronic atrial fibrillation.  Negative risk factors for bleeding include an age less than 95 years old, no history of CVA/TIA, no history of GI bleeding, and absence of serious comorbidities.  The bleeding index is 'low risk'.  Positive CHADS2 values include History of HTN.  Negative CHADS2 values include History of CHF, Age > 53 years old, History of Diabetes, and Prior Stroke/CVA/TIA.  His last INR was 1.3.  Anticoagulation responsible provider: Juanda Chance MD, Smitty Cords.  INR POC: 1.9.  Cuvette Lot#: [id number].  Exp: 07/2010.    Anticoagulation Management Assessment/Plan:      The patient's current anticoagulation dose is Warfarin sodium 5 mg tabs: Use as directed by Anticoagulation Clinic.  The target INR is 2.0-3.0.  The next INR is due 07/13/2009.  Anticoagulation instructions were given to patient.  Results were reviewed/authorized by Bethena Midget, RN, BSN.  He was notified by Bethena Midget, RN, BSN.         Prior Anticoagulation Instructions: INR 2.3  Continue on same dosage 1.5 tablets daily except 2 tablets on MOndays, Wednesdays and Fridays.  Recheck in 4 weeks.     Current Anticoagulation Instructions: INR 1.9 Change dose to 10mg s daily except 7.5mg s on Tuesdays, Thursdays and Sundays. Recheck in 3 weeks.  Per S. Putt, Pharm D Prescriptions: WARFARIN SODIUM 5 MG TABS (WARFARIN SODIUM) Use as directed by Anticoagulation Clinic  #60 x 3   Entered by:   Tiffany Muse, RN, BSN   Authorized by:   Paula Virginia Ross, MD, FACC   Signed by:   Tiffany Muse, RN, BSN on 06/22/2009   Method used:   Electronically to        [facility]* (retail)       [address] [place], [place]  [address]       Ph: [phone]       Fax: [phone]   RxID:   [id number]

## 2010-04-06 NOTE — Miscellaneous (Signed)
Summary: optimal pressure 11cm  Clinical Lists Changes  Orders: Added new Referral order of DME Referral (DME) - Signed  auto shows terrible compliance at 4 hrs, good compliance at less than 4 hrs. no leaks of significance.  optimal pressure 11cm

## 2010-04-06 NOTE — Letter (Signed)
Summary: Handout Printed  Printed Handout:  - Coumadin Instructions-w/out Meds 

## 2010-04-06 NOTE — Medication Information (Signed)
Summary: rov/ewj  Anticoagulant Therapy  Managed by: Cloyde Reams, RN, BSN Referring MD: Tenny Craw MD, Gunnar Fusi PCP: [name] MD Supervising MD: Tenny Craw MD, Gunnar Fusi Indication 1: Atrial Fibrillation Lab Used: [facility] [place] Site: [address] INR POC 2.4 INR RANGE 2.0-3.0  Dietary changes: no    Health status changes: no    Bleeding/hemorrhagic complications: no    Recent/future hospitalizations: no    Any changes in medication regimen? no    Recent/future dental: no  Any missed doses?: no       Is patient compliant with meds? yes       Allergies (verified): 1)  Penicillin V Potassium (Penicillin V Potassium)  Anticoagulation Management History:      The patient is taking warfarin and comes in today for a routine follow up visit.  Anticoagulation is being administered due to chronic atrial fibrillation.  Negative risk factors for bleeding include an age less than 62 years old, no history of CVA/TIA, no history of GI bleeding, and absence of serious comorbidities.  The bleeding index is 'low risk'.  Positive CHADS2 values include History of HTN.  Negative CHADS2 values include History of CHF, Age > 5 years old, History of Diabetes, and Prior Stroke/CVA/TIA.  His last INR was 1.3.  Anticoagulation responsible provider: Tenny Craw MD, Gunnar Fusi.  INR POC: 2.4.  Cuvette Lot#: [id number].  Exp: 04/2010.    Anticoagulation Management Assessment/Plan:      The patient's current anticoagulation dose is Warfarin sodium 5 mg tabs: Use as directed by Anticoagulation Clinic.  The target INR is 2.0-3.0.  The next INR is due 03/23/2009.  Anticoagulation instructions were given to patient.  Results were reviewed/authorized by Cloyde Reams, RN, BSN.  He was notified by Cloyde Reams RN.         Prior Anticoagulation Instructions: INR 2.1  Start taking 1.5 tablets daily except 2 tablets on Mondays, Wednesdays and Fridays.  Pt pending cardioversion.  Weekly checks x 4 weeks.    Current  Anticoagulation Instructions: INR 2.4  Continue on same dosage 1.5 tablets daily except 2 tablets on Mondays, Wednesdays, and Fridays.   Recheck in 1 week.

## 2010-04-06 NOTE — Assessment & Plan Note (Signed)
Summary: consult before col pt on bt...em   History of Present Illness Visit Type: Initial Visit Primary GI MD: Elie Goody MD [facility] Primary Provider: [name] MD Chief Complaint: Colon screening, patient on coumadin, 3 year recall History of Present Illness:   This is a 60 year old male with a prior history of adenomatous colon polyps initially diagnosed in May 2005. He is maintained on Coumadin anticoagulation for atrial fibrillation and has stable hypertension, dyslipidemia, and sleep apnea. He has no ongoing colorectal complaints, except for mild constipation that he attributes to medications. He appears to be on a high-fiber diet states he drinks about 2 L of water daily.   GI Review of Systems      Denies abdominal pain, acid reflux, belching, bloating, chest pain, dysphagia with liquids, dysphagia with solids, heartburn, loss of appetite, nausea, vomiting, vomiting blood, weight loss, and  weight gain.      Reports constipation.     Denies anal fissure, black tarry stools, change in bowel habit, diarrhea, diverticulosis, fecal incontinence, heme positive stool, hemorrhoids, irritable bowel syndrome, jaundice, light color stool, liver problems, rectal bleeding, and  rectal pain.   Current Medications (verified): 1)  Clonidine Hcl 0.1 Mg Tabs (Clonidine Hcl) .... Take 1 Tablet By Mouth Twice A Day 2)  Norvasc 10 Mg Tabs (Amlodipine Besylate) .... Take 1 Tablet By Mouth Once A Day 3)  Simvastatin 40 Mg Tabs (Simvastatin) .Marland Kitchen.. 1 By Mouth Once Daily 4)  Lisinopril 20 Mg Tabs (Lisinopril) .... 2 Tabs Once Daily 5)  Fish Oil   Oil (Fish Oil) .... Once Daily 6)  Warfarin Sodium 5 Mg Tabs (Warfarin Sodium) .... Use As Directed By Anticoagulation Clinic 7)  Aspirin 81 Mg Tbec (Aspirin) .... Take One Tablet By Mouth Daily 8)  Carvedilol 25 Mg Tabs (Carvedilol) .... Take One Tablet By Mouth Twice A Day 9)  Hydrochlorothiazide 12.5 Mg Tabs (Hydrochlorothiazide) .... Once  Daily  Allergies (verified): 1)  Penicillin V Potassium (Penicillin V Potassium)  Past History:  Past Medical History: Reviewed history from 01/14/2010 and no changes required. MITRAL REGURGITATION (ICD-424.0) ATRIAL FIBRILLATION (ICD-427.31), sees Dr. [name] HYPERTENSION (ICD-401.9) DYSLIPIDEMIA (ICD-272.4) PLANTAR FASCIITIS (ICD-728.71) OBESITY (ICD-278.00) EXTERNAL HEMORRHOIDS (ICD-455.3) INTERNAL HEMORRHOIDS (ICD-455.0 RENAL CALCULUS, HX OF (ICD-V13.01) HYPOKALEMIA, HX OF (ICD-V12.2) Sleep apnea, sees Dr. Osborne Oman Adenomatous Colon Polyps 07/2003  Past Surgical History: Reviewed history from 08/18/2008 and no changes required. Denies surgical history colonoscopy 11-14-06 per Dr. Russella Dar, benign polyps, repeat 3 yrs  Family History: Reviewed history from 11/17/2008 and no changes required. Family History of CAD Male 1st degree relative <50 Family History Hypertension heart disease: father   Social History: Reviewed history from 11/17/2008 and no changes required. Single Never Smoked Alcohol use-yes Drug use-no pt works as asst Theatre stage manager.    Review of Systems  The patient denies allergy/sinus, anemia, anxiety-new, arthritis/joint pain, back pain, blood in urine, breast changes/lumps, change in vision, confusion, cough, coughing up blood, depression-new, fainting, fatigue, fever, headaches-new, hearing problems, heart murmur, heart rhythm changes, itching, menstrual pain, muscle pains/cramps, night sweats, nosebleeds, pregnancy symptoms, shortness of breath, skin rash, sleeping problems, sore throat, swelling of feet/legs, swollen lymph glands, thirst - excessive , urination - excessive , urination changes/pain, urine leakage, vision changes, and voice change.    Vital Signs:  Patient profile:   60 year old male Height:      72 inches Weight:      292.13 pounds BMI:     39.76 Pulse  rate:   68 / minute Pulse rhythm:   irregular BP sitting:   120 / 70   (left arm) Cuff size:   large  Vitals Entered By: June McMurray CMA Duncan Dull) (January 20, 2010 2:39 PM)  Physical Exam  General:  Well developed, well nourished, no acute distress. obese.   Head:  Normocephalic and atraumatic. Eyes:  PERRLA, no icterus. Nose:  No deformity, discharge,  or lesions. Mouth:  No deformity or lesions, dentition normal. Neck:  Supple; no masses or thyromegaly. Lungs:  Clear throughout to auscultation. Heart:  irregular rhythm: and heart murmur systolic: 2/6.   Abdomen:  Soft, nontender and nondistended. No masses, hepatosplenomegaly or hernias noted. Normal bowel sounds. Rectal:  deferred until time of colonoscopy.   Msk:  Symmetrical with no gross deformities. Normal posture. Pulses:  Normal pulses noted. Extremities:  No clubbing, cyanosis, edema or deformities noted. Neurologic:  Alert and  oriented x4;  grossly normal neurologically. Cervical Nodes:  No significant cervical adenopathy. Psych:  Alert and cooperative. Normal mood and affect.  Impression & Recommendations:  Problem # 1:  PERSONAL HX COLONIC POLYPS (ICD-V12.72) Personal history of adenomatous colon polyps. He is due for surveillance colonoscopy. The risks, benefits and alternatives to colonoscopy with possible biopsy and possible polypectomy were discussed with the patient and they consent to proceed. The procedure will be scheduled electively. Orders: Colonoscopy (Colon)  Problem # 2:  COUMADIN THERAPY (ICD-V58.61) Long-term Coumadin anticoagulation for atrial fibrillation. The risks, benefits, and alternatives to a 5 day hold of Coumadin were discussed with the patient and he agrees to proceed. Obtain clearance from Dr. Tenny Craw.  Problem # 3:  OTHER CONSTIPATION (ICD-564.09) Mild chronic constipation, which may be medication-related. Continue a high-fiber diet with adequate water intake as he is doing. Begin Colace 100 mg daily.  Patient Instructions: 1)  Pick up your prep from your  pharmacy.  2)  Colonoscopy brochure given.  3)  Start Colace one tablet by mouth once daily for constipation.  4)  Copy sent to: [name], MD 5)  The medication list was reviewed and reconciled.  All changed / newly prescribed medications were explained.  A complete medication list was provided to the patient / caregiver.  Prescriptions: MOVIPREP 100 GM  SOLR (PEG-KCL-NACL-NASULF-NA ASC-C) As per prep instructions.  #1 x 0   Entered by:   Christie Nottingham CMA (AAMA)   Authorized by:   Meryl Dare MD [facility]   Signed by:   Christie Nottingham CMA (AAMA) on 01/20/2010   Method used:   Electronically to        [facility]* (retail)       [address] [place], [place]  [address]       Ph: [phone]       Fax: [phone]   RxID:   [id number]   Appended Document: consult before col pt on bt...em OK to come off coumadin for colonoscopy.  Resume after.  Low risk for CVA.  Appended Document: consult before col pt on bt...em Left message for patient  to call back.

## 2010-04-06 NOTE — Procedures (Signed)
Summary: Colonoscopy   Colonoscopy  Procedure date:  07/30/2003  Findings:      Results: Hemorrhoids.     Pathology:  Adenomatous polyp.        Location:  [facility].    Procedures Next Due Date:    Colonoscopy: 08/2006  Colonoscopy  Procedure date:  07/30/2003  Findings:      Results: Hemorrhoids.     Pathology:  Adenomatous polyp.        Location:  [facility].    Procedures Next Due Date:    Colonoscopy: 08/2006 Patient Name: [name], Massi MRN:  Procedure Procedures: Colonoscopy CPT: [address].    with polypectomy. CPT: [id number].    with injection sclerosis of hemorrhoids [id number]  Personnel: Endoscopist: Malcolm T. Russella Dar, MD, Clementeen Graham.  Referred By: Gershon Crane, MD.  Exam Location: Exam performed in Outpatient Clinic. Outpatient  Patient Consent: Procedure, Alternatives, Risks and Benefits discussed, consent obtained, from patient. Consent was obtained by the RN.  Indications Symptoms: Hematochezia.  History  Current Medications: Patient is not currently taking Coumadin.  Pre-Exam Physical: Performed Jul 30, 2003. Cardio-pulmonary exam, Rectal exam abnormal. HEENT exam , Abdominal exam, Extremity exam, Neurological exam, Mental status exam WNL. Abnormal PE findings include: systolic murmur and ext. hem.  Exam Exam: Extent of exam reached: Cecum, extent intended: Cecum.  The cecum was identified by appendiceal orifice and IC valve. Colon retroflexion performed. ASA Classification: II. Tolerance: excellent.  Monitoring: Pulse and BP monitoring, Oximetry used. Supplemental O2 given.  Colon Prep Used Miralax for colon prep. Prep results: good.  Sedation Meds: Patient assessed and found to be appropriate for moderate (conscious) sedation. Fentanyl 75 mcg. given IV. Versed 8 mg. given IV.  Findings MULTIPLE POLYPS: Ascending Colon. minimum size 3 mm, maximum size 6 mm. Procedure:  snare without cautery, removed, Polyp retrieved,  2 polyps Polyps sent to pathology. ICD9: Colon Polyps: 211.3.  NORMAL EXAM: Cecum.  NORMAL EXAM: Hepatic Flexure to Sigmoid Colon.  HEMORRHOIDS: Internal. Size: Medium. Not bleeding. Not thrombosed. ICD9: Hemorrhoids, Internal: 455.0.  - Injection: Rectum. 3 ccs. Outcome: successful. Comments: 23.4% saline.   Assessment  Diagnoses: 211.3: Colon Polyps.  455.6: Hemorrhoids, Internal and  External.   Events  Unplanned Interventions: No intervention was required.  Unplanned Events: There were no complications. Plans  Post Exam Instructions: No aspirin or non-steroidal containing medications: 2 weeks.  Medication Plan: Await pathology. Continue current medications. Hemorrhoidal Medications: Anusol HC Suppositories HS prn,  Hemorrhoidal Medications: Anusol HC Cream HS prn,   Patient Education: Patient given standard instructions for: Polyps. Hemorrhoids.  Disposition: After procedure patient sent to recovery. After recovery patient sent home.  Scheduling/Referral: Colonoscopy, to [facility] T. Russella Dar, MD, [facility], if polyp(s) adenomatous, around Jul 30, 2006.  Office Visit, to [employer]. Russella Dar, MD, [facility], prn    This report was created from the original endoscopy report, which was reviewed and signed by the above listed endoscopist.    cc: Gershon Crane, MD

## 2010-04-06 NOTE — Assessment & Plan Note (Signed)
Summary: rov for osa   Copy to:  [name] Primary Provider/Referring Provider:  [name] MD  CC:  Pt is here for a 6 month f/u appt.  Pt states he is wearing his cpap machine every night.  Approx 5 to 6 hours per night.  pt denied any new complaints.  .  History of Present Illness: The pt comes in today for his yearly f/u of osa. He is wearing cpap compliantly, and is having no issues with mask fit or presssure.  He is sleeping well, and has adequate daytime alertness.  He is working on weight loss.  Medications Prior to Update: 1)  Clonidine Hcl 0.1 Mg Tabs (Clonidine Hcl) .... Take 1 Tablet By Mouth Twice A Day 2)  Hydrochlorothiazide 25 Mg Tabs (Hydrochlorothiazide) .... 1/2  By Mouth Once Daily 3)  Norvasc 10 Mg Tabs (Amlodipine Besylate) .... Take 1 Tablet By Mouth Once A Day 4)  Simvastatin 40 Mg Tabs (Simvastatin) .[name].. 1 By Mouth Once Daily 5)  Etodolac 500 Mg  Tabs (Etodolac) .... Two Times A Day As Needed Pain 6)  Lisinopril 20 Mg Tabs (Lisinopril) .... 2 Tabs Once Daily 7)  Fish Oil   Oil (Fish Oil) .... Once Daily 8)  Warfarin Sodium 5 Mg Tabs (Warfarin Sodium) .... Use As Directed By Anticoagulation Clinic 9)  Aspirin 81 Mg Tbec (Aspirin) .... Take One Tablet By Mouth Daily 10)  Carvedilol 25 Mg Tabs (Carvedilol) .... Take One Tablet By Mouth Twice A Day  Allergies (verified): 1)  Penicillin V Potassium (Penicillin V Potassium)  Review of Systems      See HPI  Vital Signs:  Patient profile:   60 year old male Height:      72 inches Weight:      301 pounds BMI:     40.97 O2 Sat:      95 % on Room air Temp:     97.5 degrees F oral Pulse rate:   55 / minute BP sitting:   140 / 78  (left arm) Cuff size:   large  Vitals Entered By: Arman Filter LPN (June 15, 2009 9:39 AM)  O2 Flow:  Room air CC: Pt is here for a 6 month f/u appt.  Pt states he is wearing his cpap machine every night.  Approx 5 to 6 hours per night.  pt denied any new complaints.     Comments Medications reviewed with patient Arman Filter LPN  June 15, 2009 9:39 AM    Physical Exam  General:  obese male in nad Nose:  no skin breakdown or pressure necrosis from cpap mask Neurologic:  alert and not sleepy, moves all 4.   Impression & Recommendations:  Problem # 1:  OBSTRUCTIVE SLEEP APNEA (ICD-327.23)  the pt is doing well with cpap, and has no issues with tolerance.  He does sleep better with increased daytime alertness.  He has lost 12 pounds since the last visit, and I have encouraged him to continue.    Other Orders: Est. Patient Level II ([address])  Patient Instructions: 1)  stay on cpap 2)  work on weight loss 3)  followup with me in 12mos, but call if having issues with cpap or with your sleep.

## 2010-04-06 NOTE — Medication Information (Signed)
Summary: rov/tm  Anticoagulant Therapy  Managed by: Eda Keys, PharmD Referring MD: Tenny Craw MD, Gunnar Fusi PCP: [name] MD Supervising MD: Gala Romney MD, Reuel Boom Indication 1: Atrial Fibrillation Lab Used: [facility] [place] Site: [address] INR POC 2.4 INR RANGE 2.0-3.0  Dietary changes: no    Health status changes: no    Bleeding/hemorrhagic complications: no    Recent/future hospitalizations: no    Any changes in medication regimen? no    Recent/future dental: no  Any missed doses?: no       Is patient compliant with meds? yes      Comments: Recent cardioversion this past January (1/31).  Current Medications (verified): 1)  Aug Betamethasone Dipropionate 0.05 % Crea (Aug Betamethasone Dipropionate) .... As Needed 2)  Clonidine Hcl 0.1 Mg Tabs (Clonidine Hcl) .... Take 1 Tablet By Mouth Twice A Day 3)  Hydrochlorothiazide 25 Mg Tabs (Hydrochlorothiazide) .... 1/2  By Mouth Once Daily 4)  Norvasc 10 Mg Tabs (Amlodipine Besylate) .... Take 1 Tablet By Mouth Once A Day 5)  Simvastatin 40 Mg Tabs (Simvastatin) .Marland Kitchen.. 1 By Mouth Once Daily 6)  Metoprolol Tartrate 100 Mg  Tabs (Metoprolol Tartrate) .... Two Times A Day 7)  Etodolac 500 Mg  Tabs (Etodolac) .... Two Times A Day As Needed Pain 8)  Lisinopril 20 Mg Tabs (Lisinopril) .... 2 Tabs Once Daily 9)  Fish Oil   Oil (Fish Oil) .... Once Daily 10)  Warfarin Sodium 5 Mg Tabs (Warfarin Sodium) .... Use As Directed By Anticoagulation Clinic 11)  Aspirin 81 Mg Tbec (Aspirin) .... Take One Tablet By Mouth Daily  Allergies (verified): 1)  Penicillin V Potassium (Penicillin V Potassium)  Anticoagulation Management History:      The patient is taking warfarin and comes in today for a routine follow up visit.  Anticoagulation is being administered due to chronic atrial fibrillation.  Negative risk factors for bleeding include an age less than 88 years old, no history of CVA/TIA, no history of GI bleeding, and  absence of serious comorbidities.  The bleeding index is 'low risk'.  Positive CHADS2 values include History of HTN.  Negative CHADS2 values include History of CHF, Age > 70 years old, History of Diabetes, and Prior Stroke/CVA/TIA.  His last INR was 1.3.  Anticoagulation responsible provider: Eulalah Rupert MD, Reuel Boom.  INR POC: 2.4.  Cuvette Lot#: [id number].  Exp: 06/2010.    Anticoagulation Management Assessment/Plan:      The patient's current anticoagulation dose is Warfarin sodium 5 mg tabs: Use as directed by Anticoagulation Clinic.  The target INR is 2.0-3.0.  The next INR is due 05/25/2009.  Anticoagulation instructions were given to patient.  Results were reviewed/authorized by Eda Keys, PharmD.  He was notified by Eda Keys.         Prior Anticoagulation Instructions: INR 2.2 Continue 7.5mg s daily except 10mg s on Mondays, Wednesdays and Fridays. Recheck in 3 weeks.   Current Anticoagulation Instructions: INR 2.4  Continue current dosing schedule.  Take 2 tablets on monday, Wednesday, and Friday and take 1.5 tablets all other days.  Return to clinic in 4 weeks.

## 2010-04-06 NOTE — Letter (Signed)
Summary: [facility] Instructions  [facility]  [address] [place], [place] [address]   Phone: [phone]  Fax: [phone]       [name]    [birth year]    MRN: [id number]        Procedure Day /Date:  Tuesday January 10th, 2012     Arrival Time: 7:30am     Procedure Time: 8:30am     Location of Procedure:                    _x _  [facility] (4th Floor)                        PREPARATION FOR COLONOSCOPY WITH MOVIPREP   Starting 5 days prior to your procedure 03/11/10 do not eat nuts, seeds, popcorn, corn, beans, peas,  salads, or any raw vegetables.  Do not take any fiber supplements (e.g. Metamucil, Citrucel, and Benefiber).  THE DAY BEFORE YOUR PROCEDURE         DATE: 03/15/10  DAY: Monday  1.  Drink clear liquids the entire day-NO SOLID FOOD  2.  Do not drink anything colored red or purple.  Avoid juices with pulp.  No orange juice.  3.  Drink at least 64 oz. (8 glasses) of fluid/clear liquids during the day to prevent dehydration and help the prep work efficiently.  CLEAR LIQUIDS INCLUDE: Water Jello Ice Popsicles Tea (sugar ok, no milk/cream) Powdered fruit flavored drinks Coffee (sugar ok, no milk/cream) Gatorade Juice: apple, white grape, white cranberry  Lemonade Clear bullion, consomm, broth Carbonated beverages (any kind) Strained chicken noodle soup Hard Candy                             4.  In the morning, mix first dose of MoviPrep solution:    Empty 1 Pouch A and 1 Pouch B into the disposable container    Add lukewarm drinking water to the top line of the container. Mix to dissolve    Refrigerate (mixed solution should be used within 24 hrs)  5.  Begin drinking the prep at 5:00 p.m. The MoviPrep container is divided by 4 marks.   Every 15 minutes drink the solution down to the next mark (approximately 8 oz) until the full liter is complete.   6.  Follow completed prep with 16 oz of clear liquid of your choice  (Nothing red or purple).  Continue to drink clear liquids until bedtime.  7.  Before going to bed, mix second dose of MoviPrep solution:    Empty 1 Pouch A and 1 Pouch B into the disposable container    Add lukewarm drinking water to the top line of the container. Mix to dissolve    Refrigerate  THE DAY OF YOUR PROCEDURE      DATE: 03/16/10 DAY: Tuesday  Beginning at 3:30 a.m. (5 hours before procedure):         1. Every 15 minutes, drink the solution down to the next mark (approx 8 oz) until the full liter is complete.  2. Follow completed prep with 16 oz. of clear liquid of your choice.    3. You may drink clear liquids until 6:30am (2 HOURS BEFORE PROCEDURE).   MEDICATION INSTRUCTIONS  Unless otherwise instructed, you should take regular prescription medications with a small sip of water   as early as possible the morning of  your procedure.  Stop taking Coumadin on  _ _  (5 days before procedure).  Additional medication instructions: You will be contaced by our office prior to your procedure for directions on holding your Coumadin/Warfarin.  If you do not hear from our office 1 week prior to your scheduled procedure, please call [phone] to discuss.         OTHER INSTRUCTIONS  You will need a responsible adult at least 61 years of age to accompany you and drive you home.   This person must remain in the waiting room during your procedure.  Wear loose fitting clothing that is easily removed.  Leave jewelry and other valuables at home.  However, you may wish to bring a book to read or  an iPod/MP3 player to listen to music as you wait for your procedure to start.  Remove all body piercing jewelry and leave at home.  Total time from sign-in until discharge is approximately 2-3 hours.  You should go home directly after your procedure and rest.  You can resume normal activities the  day after your procedure.  The day of your procedure you should not:   Drive    Make legal decisions   Operate machinery   Drink alcohol   Return to work  You will receive specific instructions about eating, activities and medications before you leave.    The above instructions have been reviewed and explained to me by   _______________________    I fully understand and can verbalize these instructions _____________________________ Date _________

## 2010-04-06 NOTE — Letter (Signed)
Summary: Office Visit Letter  [facility]  [address] [place], [place] [address]   Phone: [phone]  Fax: [phone]      November 10, 2009 MRN: [id number]   [facility] [address] [place], [place]  [address]   Dear Mr. Nardelli,   According to our records, it is time for you to schedule a follow-up office visit with [place] in November.   At your convenience, please call [phone] (option #2)to schedule an office visit. If you have any questions, concerns, or feel that this letter is in error, we would appreciate your call.   Sincerely,  Judie Petit T. Russella Dar, M.D.  [facility] [phone]

## 2010-04-06 NOTE — Medication Information (Signed)
Summary: rov/sp  Anticoagulant Therapy  Managed by: Bethena Midget, RN, BSN Referring MD: Tenny Craw MD, Gunnar Fusi PCP: [name] MD Supervising MD: Excell Seltzer MD, Casimiro Needle Indication 1: Atrial Fibrillation Lab Used: [facility] [place] Site: [address] INR POC 2.3 INR RANGE 2.0-3.0  Dietary changes: no    Health status changes: no    Bleeding/hemorrhagic complications: no    Recent/future hospitalizations: no    Any changes in medication regimen? no    Recent/future dental: no  Any missed doses?: no       Is patient compliant with meds? yes       Allergies: 1)  Penicillin V Potassium (Penicillin V Potassium)  Anticoagulation Management History:      The patient is taking warfarin and comes in today for a routine follow up visit.  He is being anticoagulated because of chronic atrial fibrillation.  Negative risk factors for bleeding include an age less than 3 years old, no history of CVA/TIA, no history of GI bleeding, and absence of serious comorbidities.  The bleeding index is 'low risk'.  Positive CHADS2 values include History of HTN.  Negative CHADS2 values include History of CHF, Age > 56 years old, History of Diabetes, and Prior Stroke/CVA/TIA.  His last INR was 1.3.  Anticoagulation responsible provider: Excell Seltzer MD, Casimiro Needle.  INR POC: 2.3.  Cuvette Lot#: [id number].  Exp: 12/2010.    Anticoagulation Management Assessment/Plan:      The patient's current anticoagulation dose is Warfarin sodium 5 mg tabs: Use as directed by Anticoagulation Clinic.  The target INR is 2.0-3.0.  The next INR is due 12/21/2009.  Anticoagulation instructions were given to patient.  Results were reviewed/authorized by Bethena Midget, RN, BSN.  He was notified by Bethena Midget, RN, BSN.         Prior Anticoagulation Instructions: INR 2.4  Continue 2 tablets daily except 1.5 tablets Sun, Tue and Thu.  Return to clinic in 4 weeks.  Current Anticoagulation Instructions: INR 2.3 Continue 10mg s  everyday except 7.5mg s on Sundays, Tuesdays and Thursdays. Recheck in 4 weeks.

## 2010-04-08 NOTE — Letter (Addendum)
Summary: Patient Notice- Polyp Results  [facility]  [address] [place], [place] [address]   Phone: [phone]  Fax: [phone]        March 23, 2010 MRN: [id number]    [facility] [address] [place], [place]  [address]    Dear Mr. Mchaney,  I am pleased to inform you that the colon polyp removed during your recent colonoscopy was found to be benign (no cancer detected) upon pathologic examination.  I recommend you have a repeat colonoscopy examination in 5 years to look for recurrent polyps, as having colon polyps increases your risk for having recurrent polyps or even colon cancer in the future.  Should you develop new or worsening symptoms of abdominal pain, bowel habit changes or bleeding from the rectum or bowels, please schedule an evaluation with either your primary care physician or with me.  Continue treatment plan as outlined the day of your exam.  Please call us if you are having persistent problems or have questions about your condition that have not been fully answered at this time.  Sincerely,  Meryl Dare MD [facility]  This letter has been electronically signed by your physician.  Appended Document: Patient Notice- Polyp Results Letter mailed

## 2010-04-08 NOTE — Medication Information (Signed)
Summary: rov/tp  Anticoagulant Therapy  Managed by: Geoffry Paradise, PharmD Referring MD: Tenny Craw MD, Gunnar Fusi PCP: [name] MD Supervising MD: Shirlee Latch MD, Mehreen Azizi Indication 1: Atrial Fibrillation Lab Used: [facility] [place] Site: [address] INR POC 2.3 INR RANGE 2.0-3.0  Dietary changes: no    Health status changes: no    Bleeding/hemorrhagic complications: no    Recent/future hospitalizations: no    Any changes in medication regimen? no    Recent/future dental: no  Any missed doses?: no       Is patient compliant with meds? yes      Comments: Pt went for colonoscopy on 01/10, coumadin was held and restarted on the 10th.    Allergies: 1)  Penicillin V Potassium (Penicillin V Potassium)  Anticoagulation Management History:      The patient is taking warfarin and comes in today for a routine follow up visit.  Anticoagulation is being administered due to chronic atrial fibrillation.  Negative risk factors for bleeding include an age less than 83 years old, no history of CVA/TIA, no history of GI bleeding, and absence of serious comorbidities.  The bleeding index is 'low risk'.  Positive CHADS2 values include History of HTN.  Negative CHADS2 values include History of CHF, Age > 61 years old, History of Diabetes, and Prior Stroke/CVA/TIA.  His last INR was 1.3.  Anticoagulation responsible provider: Shirlee Latch MD, Trevan Messman.  INR POC: 2.3.  Cuvette Lot#: [id number].  Exp: 03/2011.    Anticoagulation Management Assessment/Plan:      The patient's current anticoagulation dose is Warfarin sodium 5 mg tabs: Use as directed by Anticoagulation Clinic.  The target INR is 2.0-3.0.  The next INR is due 04/26/2010.  Anticoagulation instructions were given to patient.  Results were reviewed/authorized by Geoffry Paradise, PharmD.         Prior Anticoagulation Instructions: cont with current regimen return to clinic on jan 20th at 315 pm  Current Anticoagulation Instructions: INR:  2.3 (goal 2-3)  Your INR is at goal today.  Continue taking 2 tablets everyday EXCEPT 1.5 tablets on Sunday, Tuesday, and Thursday.  Return to clinic in 4 weeks for another INR check.

## 2010-04-08 NOTE — Procedures (Signed)
Summary: Colonoscopy  Patient: [name] Note: All result statuses are Final unless otherwise noted.  Tests: (1) Colonoscopy (COL)   COL Colonoscopy           DONE     [facility]     [address]     [place], [place]  [address]           COLONOSCOPY PROCEDURE REPORT     PATIENT:  Blake, Kline  MR#:  [id number]     BIRTHDATE:  [birth year], 59 yrs. old  GENDER:  male     ENDOSCOPIST:  Judie Petit T. Russella Dar, MD, [facility]           PROCEDURE DATE:  03/16/2010     PROCEDURE:  Colonoscopy with snare polypectomy     ASA CLASS:  Class III     INDICATIONS:  1) surveillance and high-risk screening  2) history     of adenomatous colon polyps: 07/2003.     MEDICATIONS:   Fentanyl 50 mcg IV, Versed 5 mg IV     DESCRIPTION OF PROCEDURE:   After the risks benefits and     alternatives of the procedure were thoroughly explained, informed     consent was obtained.  Digital rectal exam was performed and     revealed no abnormalities.   The [facility] [id number] endoscope     was introduced through the anus and advanced to the cecum, which     was identified by both the appendix and ileocecal valve, without     limitations.  The quality of the prep was good, using MoviPrep.     The instrument was then slowly withdrawn as the colon was fully     examined.     <<PROCEDUREIMAGES>>     FINDINGS:  A sessile polyp was found in the ascending colon. It     was 6 mm in size. Polyp was snared without cautery. Retrieval was     successful.  A normal appearing cecum, ileocecal valve, and     appendiceal orifice were identified. The hepatic flexure,     transverse, splenic flexure, descending, sigmoid colon, and rectum     appeared unremarkable. Retroflexed views in the rectum revealed     hypertrophied anal papillae.  The time to cecum =  2.67  minutes.     The scope was then withdrawn (time =  10.67  min) from the patient     and the procedure completed.           COMPLICATIONS:  None        ENDOSCOPIC IMPRESSION:     1) 6 mm sessile polyp in the ascending colon     2) Hypertrophied anal papillae           RECOMMENDATIONS:     1) Hold aspirin, aspirin products, and anti-inflamatory     medication for 2 weeks.     2) Await pathology results     3) Resume Coumadin (warfarin) today     4) Repeat Colonoscopy in 5 years.           Venita Lick. Russella Dar, MD, [facility] Graham           CC: [name], MD           n.     Rosalie DoctorVenita Lick. Forbes Loll at 03/16/2010 08:56 AM           Desma Maxim, [id number]  Note: An exclamation mark (!) indicates  a result that was not dispersed into the flowsheet. Document Creation Date: 03/16/2010 8:56 AM _______________________________________________________________________  (1) Order result status: Final Collection or observation date-time: 03/16/2010 08:52 Requested date-time:  Receipt date-time:  Reported date-time:  Referring Physician:   Ordering Physician: Claudette Head [id number]) Specimen Source:  Source: Launa Grill Order Number: [id number] Lab site:   Appended Document: Colonoscopy recall     Procedures Next Due Date:    Colonoscopy: 03/2015

## 2010-04-08 NOTE — Medication Information (Signed)
Summary: rov/ej  Anticoagulant Therapy  Managed by: Samantha Crimes, PharmD Referring MD: Tenny Craw MD, Gunnar Fusi PCP: [name] MD Supervising MD: Gala Romney MD, Reuel Boom Indication 1: Atrial Fibrillation Lab Used: [facility] [place] Site: [address] INR POC 2.4 INR RANGE 2.0-3.0  Dietary changes: no    Health status changes: no    Bleeding/hemorrhagic complications: no    Recent/future hospitalizations: no    Any changes in medication regimen? no    Recent/future dental: no  Any missed doses?: no       Is patient compliant with meds? yes       Current Medications (verified): 1)  Clonidine Hcl 0.1 Mg Tabs (Clonidine Hcl) .... Take 1 Tablet By Mouth Twice A Day 2)  Norvasc 10 Mg Tabs (Amlodipine Besylate) .... Take 1 Tablet By Mouth Once A Day 3)  Simvastatin 40 Mg Tabs (Simvastatin) .Marland Kitchen.. 1 By Mouth Once Daily 4)  Lisinopril 20 Mg Tabs (Lisinopril) .... 2 Tabs Once Daily 5)  Fish Oil   Oil (Fish Oil) .... Once Daily 6)  Warfarin Sodium 5 Mg Tabs (Warfarin Sodium) .... Use As Directed By Anticoagulation Clinic 7)  Aspirin 81 Mg Tbec (Aspirin) .... Take One Tablet By Mouth Daily 8)  Carvedilol 25 Mg Tabs (Carvedilol) .... Take One Tablet By Mouth Twice A Day 9)  Hydrochlorothiazide 12.5 Mg Tabs (Hydrochlorothiazide) .... Once Daily 10)  Moviprep 100 Gm  Solr (Peg-Kcl-Nacl-Nasulf-Na Asc-C) .... As Per Prep Instructions.  Allergies (verified): 1)  Penicillin V Potassium (Penicillin V Potassium)  Anticoagulation Management History:      Warfarin therapy is being given due to chronic atrial fibrillation.  Negative risk factors for bleeding include an age less than 88 years old, no history of CVA/TIA, no history of GI bleeding, and absence of serious comorbidities.  The bleeding index is 'low risk'.  Positive CHADS2 values include History of HTN.  Negative CHADS2 values include History of CHF, Age > 62 years old, History of Diabetes, and Prior Stroke/CVA/TIA.  His last  INR was 1.3.  Anticoagulation responsible Sumire Halbleib: Bensimhon MD, Reuel Boom.  INR POC: 2.4.  Exp: 01/2011.    Anticoagulation Management Assessment/Plan:      The patient's current anticoagulation dose is Warfarin sodium 5 mg tabs: Use as directed by Anticoagulation Clinic.  The target INR is 2.0-3.0.  The next INR is due 02/15/2010.  Anticoagulation instructions were given to patient.  Results were reviewed/authorized by Samantha Crimes, PharmD.         Prior Anticoagulation Instructions: INR=2.5. Continue current regimen of 10 mg (2 tablets) on Mon, Wed, Fri, and Sat and then 7.5 mg (1.5 tablets) on Sun, Tues, and Thurs.  Current Anticoagulation Instructions: cont with current regimen return to clinic on jan 20th at 315 pm

## 2010-04-21 DIAGNOSIS — I4891 Unspecified atrial fibrillation: Secondary | ICD-10-CM

## 2010-04-23 ENCOUNTER — Ambulatory Visit (INDEPENDENT_AMBULATORY_CARE_PROVIDER_SITE_OTHER): Payer: Managed Care, Other (non HMO) | Admitting: Family Medicine

## 2010-04-23 ENCOUNTER — Encounter: Payer: Self-pay | Admitting: Family Medicine

## 2010-04-23 DIAGNOSIS — R39198 Other difficulties with micturition: Secondary | ICD-10-CM

## 2010-04-23 DIAGNOSIS — M549 Dorsalgia, unspecified: Secondary | ICD-10-CM

## 2010-04-23 DIAGNOSIS — R3989 Other symptoms and signs involving the genitourinary system: Secondary | ICD-10-CM

## 2010-04-23 LAB — POCT URINALYSIS DIPSTICK
Leukocytes, UA: NEGATIVE
Nitrite, UA: NEGATIVE
Protein, UA: NEGATIVE
Urobilinogen, UA: 0.2
pH, UA: 5.5

## 2010-04-23 MED ORDER — HYDROCODONE-ACETAMINOPHEN 5-500 MG PO TABS: 2.0000 | ORAL_TABLET | Freq: Four times a day (QID) | ORAL | Status: DC | PRN

## 2010-04-23 MED ORDER — CYCLOBENZAPRINE HCL 10 MG PO TABS: 10.0000 mg | ORAL_TABLET | Freq: Three times a day (TID) | ORAL | Status: DC | PRN

## 2010-04-23 NOTE — Progress Notes (Signed)
  Subjective:    Patient ID: [name], male    DOB: 23-Jul-1950, 60 y.o.   MRN: [id number]  HPI Here for 3 days of intermittent sharp left lower back pains. They just started while he was dressing for work one morning with no antecedent trauma. No pain or numbness in the legs. Heat helps. He has used some Motrin. No urinary symptoms but he has a hx of kidney stones.    Review of Systems  Constitutional: Negative.   Gastrointestinal: Negative.   Genitourinary: Negative.   Musculoskeletal: Positive for back pain.       Objective:   Physical Exam  Constitutional:       In some mild pain, walks with his trunk tilted to the left  Abdominal: Soft. Bowel sounds are normal. He exhibits no distension and no mass. There is no tenderness. There is no rebound and no guarding.  Musculoskeletal:       His back is not tender and ROM is full, but there is some spasm of the left lower back muscles           Assessment & Plan:  He will rest and use heat. We will give him some Vicodin for pain, but I warned him to not take any anti-inflammatory OTC meds along with his Coumadin.

## 2010-04-30 ENCOUNTER — Encounter: Payer: Self-pay | Admitting: Cardiology

## 2010-04-30 ENCOUNTER — Encounter (INDEPENDENT_AMBULATORY_CARE_PROVIDER_SITE_OTHER): Payer: Managed Care, Other (non HMO)

## 2010-04-30 DIAGNOSIS — Z7901 Long term (current) use of anticoagulants: Secondary | ICD-10-CM

## 2010-04-30 DIAGNOSIS — I4891 Unspecified atrial fibrillation: Secondary | ICD-10-CM

## 2010-04-30 LAB — CONVERTED CEMR LAB: POC INR: 2.4

## 2010-05-04 NOTE — Medication Information (Signed)
Summary: [facility]  Anticoagulant Therapy  Managed by: Weston Brass, PharmD Referring MD: Tenny Craw MD, Gunnar Fusi PCP: [name] MD Supervising MD: Jens Som MD, Arlys John Indication 1: Atrial Fibrillation Lab Used: [facility] Site: [address] INR POC 2.4 INR RANGE 2.0-3.0  Dietary changes: no    Health status changes: no    Bleeding/hemorrhagic complications: no    Recent/future hospitalizations: no    Any changes in medication regimen? yes       Details: taking a new Rx pain medication but he doesn't know the name of it.  He has been taking it for 4 days  Recent/future dental: no  Any missed doses?: no       Is patient compliant with meds? yes       Allergies: 1)  Penicillin V Potassium (Penicillin V Potassium)  Anticoagulation Management History:      The patient is taking warfarin and comes in today for a routine follow up visit.  Anticoagulation is being administered due to chronic atrial fibrillation.  Negative risk factors for bleeding include an age less than 52 years old, no history of CVA/TIA, no history of GI bleeding, and absence of serious comorbidities.  The bleeding index is 'low risk'.  Positive CHADS2 values include History of HTN.  Negative CHADS2 values include History of CHF, Age > 55 years old, History of Diabetes, and Prior Stroke/CVA/TIA.  His last INR was 1.3.  Anticoagulation responsible provider: Jens Som MD, Arlys John.  INR POC: 2.4.  Cuvette Lot#: [id number].  Exp: 04/2011.    Anticoagulation Management Assessment/Plan:      The patient's current anticoagulation dose is Warfarin sodium 5 mg tabs: Use as directed by Anticoagulation Clinic.  The target INR is 2.0-3.0.  The next INR is due 05/31/2010.  Anticoagulation instructions were given to patient.  Results were reviewed/authorized by Weston Brass, PharmD.  He was notified by Margot Chimes PharmD Candidate.         Prior Anticoagulation Instructions: INR: 2.3 (goal 2-3)  Your INR is at  goal today.  Continue taking 2 tablets everyday EXCEPT 1.5 tablets on Sunday, Tuesday, and Thursday.  Return to clinic in 4 weeks for another INR check.    Current Anticoagulation Instructions: INR 2.4   Continue to take 2 tablets daily except on Sundays, Tuesdays, and Thursdays when you take 1 1/2 tablets.  Recheck INR in 4 weeks.

## 2010-05-23 LAB — PROTIME-INR
INR: 2.21 — ABNORMAL HIGH (ref 0.00–1.49)
Prothrombin Time: 24.3 seconds — ABNORMAL HIGH (ref 11.6–15.2)

## 2010-05-23 LAB — BASIC METABOLIC PANEL
BUN: 17 mg/dL (ref 6–23)
Calcium: 10.1 mg/dL (ref 8.4–10.5)
Creatinine, Ser: 0.96 mg/dL (ref 0.4–1.5)
GFR calc non Af Amer: >60 mL/min (ref >60)
Glucose, Bld: 116 mg/dL — ABNORMAL HIGH (ref 70–99)

## 2010-05-23 LAB — CBC
HCT: 40.6 % (ref 39.0–52.0)
Platelets: 217 10*3/uL (ref 150–400)
RDW: 13.2 % (ref 11.5–15.5)
WBC: 6.8 10*3/uL (ref 4.0–10.5)

## 2010-05-26 ENCOUNTER — Other Ambulatory Visit: Payer: Self-pay | Admitting: Pharmacist

## 2010-05-26 MED ORDER — WARFARIN SODIUM 5 MG PO TABS: 5.0000 mg | ORAL_TABLET | ORAL | Status: DC

## 2010-05-31 ENCOUNTER — Ambulatory Visit (INDEPENDENT_AMBULATORY_CARE_PROVIDER_SITE_OTHER): Payer: Managed Care, Other (non HMO) | Admitting: *Deleted

## 2010-05-31 DIAGNOSIS — I4891 Unspecified atrial fibrillation: Secondary | ICD-10-CM

## 2010-05-31 NOTE — Patient Instructions (Signed)
INR 2.3 Continue your current regimen of coumadin with 2 tablets (10 mg) daily EXCEPT for 1 1/2 tablets (7.5 mg) on Tuesdays, Thursdays, and Sundays only.

## 2010-06-01 ENCOUNTER — Telehealth: Payer: Self-pay | Admitting: *Deleted

## 2010-06-01 DIAGNOSIS — I359 Nonrheumatic aortic valve disorder, unspecified: Secondary | ICD-10-CM

## 2010-06-08 NOTE — Telephone Encounter (Signed)
Ordered echo for f/u of AS.

## 2010-06-09 ENCOUNTER — Encounter: Payer: Self-pay | Admitting: Pulmonary Disease

## 2010-06-14 ENCOUNTER — Ambulatory Visit (INDEPENDENT_AMBULATORY_CARE_PROVIDER_SITE_OTHER): Payer: Managed Care, Other (non HMO) | Admitting: Pulmonary Disease

## 2010-06-14 ENCOUNTER — Encounter: Payer: Self-pay | Admitting: Pulmonary Disease

## 2010-06-14 VITALS — BP 132/82 | HR 60 | Temp 98.2°F | Ht 72.0 in | Wt 286.2 lb

## 2010-06-14 DIAGNOSIS — G4733 Obstructive sleep apnea (adult) (pediatric): Secondary | ICD-10-CM

## 2010-06-14 NOTE — Assessment & Plan Note (Signed)
The pt is doing well with cpap, and has lost weight since the last visit.  I have encouraged him to continue with his weight loss, and to keep up with cpap supplies.  He will see me back in one year.

## 2010-06-14 NOTE — Patient Instructions (Signed)
Continue working on weight loss.  You are doing well. Keep up with cpap supplies, mask changes. followup with me in one year.

## 2010-06-14 NOTE — Progress Notes (Signed)
  Subjective:    Patient ID: [name], male    DOB: May 29, 1950, 60 y.o.   MRN: [id number]  HPI The pt comes in today for f/u of his known osa.  He is wearing cpap compliantly, and feels it is helping his sleep and daytime alertness.  He has actually lost 15 pounds since the last visit.  He is overdue for new supplies and mask.   Review of Systems  Constitutional: Negative for fever and unexpected weight change.  HENT: Negative for ear pain, nosebleeds, congestion, sore throat, rhinorrhea, sneezing, trouble swallowing, dental problem, postnasal drip and sinus pressure.   Eyes: Negative for redness and itching.  Respiratory: Negative for cough, chest tightness, shortness of breath and wheezing.   Cardiovascular: Negative for palpitations and leg swelling.  Gastrointestinal: Negative for nausea and vomiting.  Genitourinary: Negative for dysuria.  Musculoskeletal: Negative for joint swelling.  Skin: Negative for rash.  Neurological: Negative for headaches.  Hematological: Does not bruise/bleed easily.  Psychiatric/Behavioral: Negative for dysphoric mood. The patient is not nervous/anxious.        Objective:   Physical Exam Obese male in nad No skin breakdown or pressure necrosis from cpap mask  No LE edema or cyanosis  Alert, does not appear sleepy, moves all 4        Assessment & Plan:

## 2010-06-26 ENCOUNTER — Other Ambulatory Visit: Payer: Self-pay | Admitting: Internal Medicine

## 2010-06-28 ENCOUNTER — Ambulatory Visit (HOSPITAL_COMMUNITY): Payer: Managed Care, Other (non HMO) | Attending: Family Medicine | Admitting: Radiology

## 2010-06-28 ENCOUNTER — Telehealth: Payer: Self-pay | Admitting: Family Medicine

## 2010-06-28 ENCOUNTER — Ambulatory Visit (INDEPENDENT_AMBULATORY_CARE_PROVIDER_SITE_OTHER): Payer: Managed Care, Other (non HMO) | Admitting: *Deleted

## 2010-06-28 DIAGNOSIS — E785 Hyperlipidemia, unspecified: Secondary | ICD-10-CM | POA: Insufficient documentation

## 2010-06-28 DIAGNOSIS — I319 Disease of pericardium, unspecified: Secondary | ICD-10-CM | POA: Insufficient documentation

## 2010-06-28 DIAGNOSIS — I4891 Unspecified atrial fibrillation: Secondary | ICD-10-CM

## 2010-06-28 DIAGNOSIS — I359 Nonrheumatic aortic valve disorder, unspecified: Secondary | ICD-10-CM

## 2010-06-28 DIAGNOSIS — E669 Obesity, unspecified: Secondary | ICD-10-CM | POA: Insufficient documentation

## 2010-06-28 DIAGNOSIS — I080 Rheumatic disorders of both mitral and aortic valves: Secondary | ICD-10-CM | POA: Insufficient documentation

## 2010-06-28 DIAGNOSIS — I079 Rheumatic tricuspid valve disease, unspecified: Secondary | ICD-10-CM | POA: Insufficient documentation

## 2010-06-28 DIAGNOSIS — I10 Essential (primary) hypertension: Secondary | ICD-10-CM | POA: Insufficient documentation

## 2010-06-28 LAB — POCT INR: INR: 2.6

## 2010-06-28 MED ORDER — LISINOPRIL 20 MG PO TABS: 2.0000 mg | ORAL_TABLET | Freq: Every day | ORAL | Status: DC

## 2010-06-28 NOTE — Telephone Encounter (Signed)
Rx done. 

## 2010-06-29 ENCOUNTER — Other Ambulatory Visit: Payer: Self-pay | Admitting: Family Medicine

## 2010-06-29 NOTE — Telephone Encounter (Signed)
Rx sent to pharmacy   

## 2010-07-01 ENCOUNTER — Other Ambulatory Visit: Payer: Self-pay

## 2010-07-01 NOTE — Telephone Encounter (Signed)
rx from pharmacy for lisinopril 20 mg---note saying cycle fill medication, authorization is required for next fill---please advise

## 2010-07-01 NOTE — Telephone Encounter (Deleted)
rx faxed to [facility] [address] for lisinopril 20 mg

## 2010-07-02 MED ORDER — LISINOPRIL 20 MG PO TABS: 20.0000 mg | ORAL_TABLET | Freq: Two times a day (BID) | ORAL | Status: DC

## 2010-07-02 NOTE — Telephone Encounter (Signed)
rx called in

## 2010-07-02 NOTE — Telephone Encounter (Signed)
Please call in refills for one year  

## 2010-07-12 ENCOUNTER — Encounter: Payer: Self-pay | Admitting: Internal Medicine

## 2010-07-12 ENCOUNTER — Ambulatory Visit (INDEPENDENT_AMBULATORY_CARE_PROVIDER_SITE_OTHER): Payer: Managed Care, Other (non HMO) | Admitting: Internal Medicine

## 2010-07-12 VITALS — BP 130/80 | HR 61 | Resp 18 | Ht 72.0 in | Wt 285.4 lb

## 2010-07-12 DIAGNOSIS — I10 Essential (primary) hypertension: Secondary | ICD-10-CM

## 2010-07-12 DIAGNOSIS — E785 Hyperlipidemia, unspecified: Secondary | ICD-10-CM

## 2010-07-12 DIAGNOSIS — G4730 Sleep apnea, unspecified: Secondary | ICD-10-CM

## 2010-07-12 DIAGNOSIS — I059 Rheumatic mitral valve disease, unspecified: Secondary | ICD-10-CM

## 2010-07-12 DIAGNOSIS — I4891 Unspecified atrial fibrillation: Secondary | ICD-10-CM

## 2010-07-20 NOTE — Assessment & Plan Note (Signed)
[facility]                         GASTROENTEROLOGY OFFICE NOTE   NAME:[name], [name]                           MRN:          [id number]  DATE:10/05/2006                            DOB:          [birth year]    CHIEF COMPLAINT:  Patient is a 60 year old white male, who returns for  evaluation of constipation, hemorrhoids and a personal history of  adenomatous colon polyps.   HISTORY OF PRESENT ILLNESS:  [name] underwent colonoscopy in May of  2005 for hematochezia.  Internal hemorrhoids were noted and injected, as  well as two polyps, which were adenomatous on biopsy.  External  hemorrhoids were also noted.  He has had worsening problems with  constipation with significant straining.  He generally has a bowel  movement every day, but the bowel movements are small, pellet-like, hard  and associated with straining.  He notes occasional episodes of small  amounts of bright red blood on the tissue paper, particularly after  straining.  He is followed by Dr. [name] for mitral regurgitation,  which apparently has been stable.  Last evaluation was in May of 2008.  No family history of colon cancer, colon polyps or inflammatory bowel  disease.   PAST MEDICAL HISTORY:  Hypertension, mitral regurgitation, dyslipidemia,  internal and external hemorrhoids, adenomatous colon polyps.   CURRENT MEDICATIONS:  Listed on the chart, updated and reviewed.   MEDICATION ALLERGIES:  PENICILLIN.   SOCIAL HISTORY:  Per the handwritten form.   REVIEW OF SYSTEMS:  Per the handwritten form.   PHYSICAL EXAM:  Obese, white male in no acute distress.  Height 6 feet, weight 301.4 pounds, blood pressure is 150/76, pulse 56  and regular.  HEENT EXAM:  Anicteric sclerae.  Oropharynx clear.  CHEST:  Clear to auscultation bilaterally.  CARDIAC:  Regular rate and rhythm with a 2-3/6 holosystolic murmur.  Regular rate and rhythm.  ABDOMEN:  Large, soft, nontender, nondistended.   Normoactive bowel  sounds, no palpable organomegaly, masses or hernias.  RECTAL EXAMINATION:  Deferred to time of colonoscopy.  EXTREMITIES:  Without clubbing, cyanosis or edema.  NEUROLOGIC:  Alert and oriented time three.  Grossly nonfocal.   ASSESSMENT AND PLAN:  Personal history of adenomatous colon polyps,  small-volume hematochezia and constipation.  He is advised to  substantially increase his fiber and fluid intake.  He may use a fiber  supplement.  If this is not adequate, begin Colace on a daily basis.  Risks, benefits, and alternatives to colonoscopy and possible biopsy and  possible polypectomy discussed with the patient.  He consents to the  procedure, to be scheduled electively.     Venita Lick. Russella Dar, MD, [facility]  Electronically Signed    MTS/MedQ  DD: 10/05/2006  DT: 10/06/2006  Job #: [id number]   cc:   Blake Senior A. Clent Ridges, MD

## 2010-07-20 NOTE — Procedures (Signed)
[name], [name]                  ACCOUNT NO.:  [id number]   MEDICAL RECORD NO.:  [id number]          PATIENT TYPE:  OUT   LOCATION:  SLEEP CENTER                 FACILITY:  [facility]   PHYSICIAN:  Barbaraann Share, MD,FCCPDATE OF BIRTH:  Aug 07, 1950   DATE OF STUDY:  10/18/2008                            NOCTURNAL POLYSOMNOGRAM   REFERRING PHYSICIAN:  Pricilla Riffle, MD, [facility]   INDICATION FOR STUDY:  Hypersomnia with sleep apnea.   EPWORTH SLEEPINESS SCORE:  Two.   MEDICATIONS:   SLEEP ARCHITECTURE:  The patient had a total sleep time of 273 minutes  with very little slow wave sleep and also decreased REM.  Sleep onset  latency was normal at 8.5 minutes, and REM was not achieved until the  titration portion of the study.  Sleep efficiency was very poor at 60%  during the diagnostic portion and 65% during the titration portion.   RESPIRATORY DATA:  The patient underwent split-night protocol, where he  was found to have 134 obstructive events in the first 122 minutes of  sleep.  This gave him an apnea-hypopnea index of 66 events per hour.  The events were worse in the supine position.  There was moderate  snoring noted throughout.  By protocol, the patient was then placed on a  large Quattro full-face mask and ultimately titrated to a final pressure  of 15 cm of water.   OXYGEN DATA:  There was O2 desaturation as low as 85% with his  obstructive events.   CARDIAC DATA:  Atrial fibrillation noted with a controlled ventricular  response and rare PVC.   MOVEMENT-PARASOMNIA:  The patient had 32 periodic leg movements, but  only one resulted in arousal awakening.  This is insignificant.   IMPRESSIONS-RECOMMENDATIONS:  1. Split night study reveals severe obstructive sleep apnea with an      apnea-hypopnea index of 66 events per hour and oxygen desaturation      as low as 85% during the diagnostic portion of the study.  The      patient was then placed on a large Quattro full-face mask  and      ultimately titrated to a final pressure of 15 cm of water with good      control of his events and snoring.  The patient should also be      encouraged to work aggressively on weight loss.  2. Atrial fibrillation and rare premature ventricular contraction      noted.      Barbaraann Share, MD,FCCP  Diplomate, American Board of Sleep  Medicine  Electronically Signed     KMC/MEDQ  D:  10/18/2008 15:03:45  T:  10/18/2008 16:10:96  Job:  [id number]

## 2010-07-20 NOTE — Assessment & Plan Note (Signed)
[facility]                            CARDIOLOGY OFFICE NOTE   NAME:[name], [name]                           MRN:          [id number]  DATE:11/19/2007                            DOB:          1950/03/24    IDENTIFICATION:  [name] is a 60 year old gentleman who I follow in  clinic.  He has a history of hypertension, mitral regurgitation, and  aortic stenosis.  Also, dyslipidemia and obesity.  I last saw him in the  spring of 2008.   In the interval, his biggest complaint is episode of plantar fasciitis  over the summer.  He has been to his primary doctor.  He is currently on  nonsteroidals.  [facility] was [facility] by [facility], Dr. Wynelle Cleveland, and  underwent injection and recommendation for orthotic.  Note, he has not  had any physical therapy.  He still has some discomfort, though he says  that at work, he ignores it and keeps moving.   He denies chest pain.  Breathing is okay.   CURRENT MEDICINES:  1. Lisinopril 40.  2. Amlodipine 10.  3. Metoprolol 200 b.i.d.  4. Clonidine 0.1 b.i.d.  5. Hydrochlorothiazide 25 daily.  6. Simvastatin 40.  7. Aspirin 325.  8. Etodolac 500 p.r.n.   PHYSICAL EXAMINATION:  GENERAL:  The patient is in no distress at rest.  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  LUNGS:  Clear.  CARDIAC:  Regular rate and rhythm, grade 2/6 systolic murmur heard best  at the left sternal border.  ABDOMEN:  Benign and obese.  EXTREMITIES:  No edema.   Lipids on November 05, 2007; total cholesterol of 132, LDL of 77, HDL of  27, and triglycerides of 137.  AST normal.   Echocardiogram (also done on November 05, 2007) LVEF was 60-70%.  The  aortic valve was thickened with mild calcification.  Mean gradient was  18 consistent with mild aortic stenosis.  There  was mild aortic  insufficiency.   The ascending aorta was mildly dilated at 42 mm.   Mitral valve showed an eccentric jet of regurgitation that was anterior,  but appeared to be mild.  Note, left atrium measured about 48 mm which  was increased from previous.   IMPRESSION:  1. Hypertension, not optimally controlled in the office.  He says it      is lower at home.  I need to see documentation.  He is to provide a      log and mail it in.  He, otherwise, will need to go on an another      agent.  I think critical for this is exercise and weight loss.  I      will refer him to dietary, discuss with [facility] regarding      his fasciitis, question getting him to physical therapy for  exercise training.  2. Valvular disease.  I am not convinced there is significant      progression in his valvular disease.  I would continue to follow      again.  We need to get blood pressure control.  3. Dyslipidemia, continue on statin.  He did not tolerate Niaspan      because of flushing.  I encouraged him to stay active.  We can get      his weight down, I think the HDL will improve.   I will set followup for 8-9 months, sooner if problems develop.  He will  be in touch with his blood pressures, again dietary referral and await  to hear a call back from the [facility].     Pricilla Riffle, MD, [facility]  Electronically Signed    PVR/MedQ  DD: 11/19/2007  DT: 11/20/2007  Job #: [id number]   cc:   Tinnie Gens A. Petrinitz, D.P.M.  Tera Mater. Clent Ridges, MD

## 2010-07-23 NOTE — Consult Note (Signed)
NAMELUTHER, [name]                              ACCOUNT NO.:  [id number]   MEDICAL RECORD NO.:  [id number]                   PATIENT TYPE:  INP   LOCATION:  3703                                 FACILITY:  [facility]   PHYSICIAN:  Pricilla Riffle, M.D.                 DATE OF BIRTH:  1950/07/09   DATE OF CONSULTATION:  02/27/2003  DATE OF DISCHARGE:                                   CONSULTATION   IDENTIFICATION:  The patient is a 60 year old gentleman who was admitted a  couple of days ago with acute nephrolithiasis and a ureteral stone.  This  passed spontaneously.  The patient, on admission, was found to be  hypertensive and also found to have a murmur.  He underwent echocardiogram  that showed marked LVH, normal LV systolic function and mitral  regurgitation.  Mitral regurgitation by review of the echo most likely  involved some mild prolapse of the posterior leaflet possibly from a  partially ruptured tertiary cord.  The MR jet courses anteriorly into the LA  and is, at least, moderately severe.   On talking to the patient the patient denied any shortness of breath prior.  He was active, walking 5 miles per day.  He did not do stairs much. He does  not have a primary MD.  Did not know that his blood pressure was high.   ALLERGIES:  PENICILLIN.   PRIOR TO ADMISSION MEDICATIONS:  1. Baby aspirin now.  2. Admissions: Catapres 0.1 p.o. b.i.d.  3. Aspirin 325 daily.  4. Flomax 0.4 daily.  5. Norvasc 5 daily.   PAST MEDICAL HISTORY:  Hypertension.   SOCIAL HISTORY:  The patient lives alone in [place]; has no children;  does not smoke; drinks rarely; works in a [employer] account manager.   FAMILY HISTORY:  Father died of CAD in his 64s.  Mother had Alzheimer's.  One sister with hypertension.   REVIEW OF SYSTEMS:  Overall I have reviewed all systems--negative to the  above problem except as noted.  The patient denies any palpitations, no PND.   PHYSICAL EXAMINATION:  GENERAL:  The  patient is in no acute distress.  VITAL SIGNS:  Blood pressure today 168/89, pulse of 76, temperature 98.  O2  saturation on room air 96.  NECK:  JVD is normal.  LUNGS:  Clear.  CARDIAC EXAM:  Regular rate and rhythm. S1-S2.  No S3 or S4. Grade 2-3/6  systolic murmur, blowing in nature from the apex radiating up to the left  sternal border.  ABDOMEN:  Abdominal exam is benign.  EXTREMITIES:  Good pulses.  No extremity edema.   Chest x-ray, by report, cardiomegaly and some mild interstitial edema.   A 12-lead EKG shows normal sinus rhythm at a rate of 79 beats/minute.   LABS:  Significant for hemoglobin of 13.1.  BUN 22 and creatinine 1.3,  potassium 3.6, TSH  1.6.  Total cholesterol 168, triglycerides 88, HDL 30,  LDL 118.   IMPRESSION:  [name] is a 60 year old who is found on examination to have  significant hypertension.  His LV is thickened and most likely secondary to  this. He also has significant MR. It is difficult on echocardiogram window  to determine exactly the etiology, but it appears that there may be some  prolapse of the posterior mitral leaflet, possibly secondary to partially  ruptured tertiary cord with reflux anteriorly into the LA.  Again, it  appears at least moderately severe per Doppler.   Would recommend for now, controlling the pressure. I think that it is  reasonable to set the patient up for a transesophageal echo to further  define the mechanism and severity of his regurgitation to guide our therapy  down the road.   In regard to hypertension, clonidine is not my first choice.  Would try  Toprol XL 25 mg daily.  I think that this would increase filling time for  his ventricle and it is ease of use also cardioprotective given his family  history.  Would use Norvasc, may need to increase to 10 mg daily, an  aggressive blood pressure control as needed.  May need to add an ACE  inhibitor which, in his case, may help from a remodeling standpoint.   In  regards to the patient's dyslipidemia, this needs addressing.  HDL is low  at 30, LDL is 191.  Diet and exercise needs to be discussed.  Would try  Niaspan to increase HDL.  Again, this can be followed up as an outpatient.   We will arrange for a transesophageal echocardiogram (TE) in cardiology and  follow up with the patient and contact him.  Patient will need primary care  to address other medical issues and continue care.                                               Pricilla Riffle, M.D.    PVR/MEDQ  D:  02/27/2003  T:  03/01/2003  Job:  [id number]

## 2010-07-23 NOTE — Assessment & Plan Note (Signed)
Adequate control. 

## 2010-07-23 NOTE — Assessment & Plan Note (Signed)
Failed to maintain SR after cardioversion in the past.  Continue coumadin.  Tolerating.

## 2010-07-23 NOTE — Consult Note (Signed)
NAMESWANSON, FARNELL                              ACCOUNT NO.:  [id number]   MEDICAL RECORD NO.:  [id number]                   PATIENT TYPE:  INP   LOCATION:  3703                                 FACILITY:  [facility]   PHYSICIAN:  Jamison Neighbor, M.D.               DATE OF BIRTH:  March 12, 1950   DATE OF CONSULTATION:  02/26/2003  DATE OF DISCHARGE:                                   CONSULTATION   REASON FOR CONSULTATION:  Ureteral calculus.   HISTORY:  This 60 year old white male presented to the emergency room on  February 25, 2003, with left-sided flank pain.  The patient was found on  evaluation to have a 3 mm left proximal ureteral calculus with what was  described as high-grade obstruction.  This was not a contrast study, so the  actual degree of obstruction cannot be determined.  However, there was noted  to be hydronephrosis, whether this is new or not is unclear.   The patient had significant pain at first, and then that stabilized.  The  patient would have normally been sent out with pain medications and try to  pass the stone; however, he was found to have both congestive heart failure  and new onset of hypertension and was admitted to the medical teaching  service for further evaluation and therapy.   The patient states that since admission he has had intermittent pain that  was well controlled but at times has been a little bit more painful from  time to time.  It is well contained with pain medications.  He does not  today he has a little bit more in the way of urgency and frequency which  would suggest that the stone has dropped and may now be near the UVJ.  The  patient denies any previous urologic history.  Specifically, he has never  had any voiding problems.  He has had no hematuria, urgency, frequency,  urinary tract infections, or previous episodes of stone.  He has undergone  no urologic surgery or instrumentation.   PAST MEDICAL HISTORY:  1. Congestive heart  failure.  2. Hypokalemia.  3. Hypertension.  4. Proteinuria.  5. Elevated glucose.   These are all new outcomes for him.  He previously had no significant past  medical history with no documented medical problems and no previous surgery.   SOCIAL HISTORY:  He works for [employer].  He is single.  He does not use  tobacco.  He drinks very modest amounts of alcohol.   REVIEW OF SYSTEMS:  Pertinent for nausea, vomiting, abdominal pain, as well  as recent problem with a rash.   PHYSICAL EXAMINATION:  GENERAL:  On examination today, he is a well-  developed, heavy male in no acute distress.  There is no shortness of breath  at this time.  The patient does not have active pain in the flank or lower  abdomen.   IMPRESSION:  1. Left ureteral calculus.  2. Congestive heart failure.  3. Hypertension.   RECOMMENDATIONS:  1. Will allow this stone to try to pass over the next week or so.  2. If the patient has not passed the stone, may consider ureteroscopy with     extraction next week.  This will certainly depend on whether or not his     congestive heart failure and hypertension are stable.  3. Will try Flomax 1 tablet daily which can often aid in passage of stone.  4. Will check the patient every few days to see if stone has passed.   I appreciated the opportunity to share in his care.                                               Jamison Neighbor, M.D.    RJE/MEDQ  D:  02/26/2003  T:  02/27/2003  Job:  [id number]

## 2010-07-23 NOTE — Progress Notes (Signed)
Patient is a 60 year old with a history of mitral regurgitation, hypertension, atrial fibrillation, sleep apnea and obesity.  I saw him in clinic last year. Since seen he has done OK from a cardiac standpoint.  He denies dizziness, deneis palpitations,  No chest pains.  Breathing is OK.  He is using his CPAP most of the time. HPI  Allergies  Allergen Reactions  . Penicillins     REACTION: unspecified    Current Outpatient Prescriptions  Medication Sig Dispense Refill  . amLODipine (NORVASC) 10 MG tablet Take 10 mg by mouth daily.        Marland Kitchen aspirin 81 MG tablet Take 81 mg by mouth daily.        . carvedilol (COREG) 12.5 MG tablet        . cloNIDine (CATAPRES) 0.1 MG tablet Take 0.1 mg by mouth 2 (two) times daily.        . cyclobenzaprine (FLEXERIL) 10 MG tablet Take 1 tablet (10 mg total) by mouth every 8 (eight) hours as needed for Muscle spasms.  60 tablet  2  . fish oil-omega-3 fatty acids 1000 MG capsule Take 1 g by mouth daily.        . hydrochlorothiazide (,MICROZIDE/HYDRODIURIL,) 12.5 MG capsule Take 25 mg by mouth daily.       Marland Kitchen HYDROcodone-acetaminophen (VICODIN) 5-500 MG per tablet Take 2 tablets by mouth every 6 (six) hours as needed for Pain.  60 tablet  0  . lisinopril (PRINIVIL,ZESTRIL) 20 MG tablet Take 1 tablet (20 mg total) by mouth 2 (two) times daily.  60 tablet  3  . simvastatin (ZOCOR) 40 MG tablet Take 40 mg by mouth at bedtime.        Marland Kitchen warfarin (COUMADIN) 5 MG tablet USE AS DIRECTED BY ANTICOAGULATION CLINIC  60 tablet  2    Past Medical History  Diagnosis Date  . Obesity   . Hypertension   . Heart valve problem     Dr [name]  . Atrial fibrillation   . Other and unspecified hyperlipidemia   . OSA (obstructive sleep apnea)   . Other and unspecified hyperlipidemia   . Mitral valve disorders     No past surgical history on file.  Family History  Problem Relation Age of Onset  . Stroke Father   . Coronary artery disease    . Hypertension       History   Social History  . Marital Status: Single    Spouse Name: N/A    Number of Children: N/A  . Years of Education: N/A   Occupational History  . Not on file.   Social History Main Topics  . Smoking status: Never Smoker   . Smokeless tobacco: Not on file  . Alcohol Use: Not on file  . Drug Use: No  . Sexually Active: Not on file   Other Topics Concern  . Not on file   Social History Narrative  . No narrative on file    Review of Systems:  All systems reviewed.  They are negative to the above problem except as previously stated.  Vital Signs: BP 130/80  Pulse 61  Resp 18  Ht 6' (1.829 m)  Wt 285 lb 6.4 oz (129.457 kg)  BMI 38.71 kg/m2  Physical Exam Patient is in NAD HEENT:  Normocephalic, atraumatic. EOMI, PERRLA.  Neck: JVP is normal. No thyromegaly. No bruits.  Lungs: clear to auscultation. No rales no wheezes.  Heart: Regular rate and rhythm.  Normal S1, S2. No S3.   Gr I-II/VI systolic murmur.Marland Kitchen PMI not displaced.  Abdomen:  Supple, nontender. Normal bowel sounds. No masses. No hepatomegaly.  Extremities:   Good distal pulses throughout. No lower extremity edema.  Musculoskeletal :moving all extremities.  Neuro:   alert and oriented x3.  CN II-XII grossly intact.  EKG:  Atrial fibrillation.  51 bpm.  LAFB.   Assessment and Plan:

## 2010-07-23 NOTE — Assessment & Plan Note (Signed)
[facility]                            CARDIOLOGY OFFICE NOTE   NAME:Kline, [name]                           MRN:          [id number]  DATE:07/10/2006                            DOB:          [birth year]    PATIENT IDENTIFICATION:  The patient is a 60 year old gentleman with a  history of mitral regurgitation, hypertension and obesity. I last saw  him back in April of last year.   In the interval, he says he is doing okay, is working hard. He still  eats out a lot because he is too tired to fix his food.   He denies significant shortness of breath, no real change in his ability  to do things. He does not exercise in a regular fashion.   Note the patient had an echocardiogram done April 28. This showed his  end diastolic to be somewhat up at 82.9 mm and systolic dimension  however remains well within the normal range at 29.8 mm. LVF overall is  55-60%. Note the aortic valve was mildly increased, there was some  increased flow across the mean gradient of 28 mm but no significant  stenosis, minimally restricted in motion. Again there was an eccentric  MR jet noted.   The patient said at Dr. Claris Che office his blood pressure was good and  when he gets his medicines at the drugstore it is good.   CURRENT MEDICATIONS:  1. Norvasc 10.  2. Hydrochlorothiazide 25 daily.  3. Clonidine 0.1 b.i.d.  4. Aspirin 325 daily.  5. Benicar 40 daily.  6. Metoprolol 100 daily.  7. Zocor 40 daily.   PHYSICAL EXAMINATION:  GENERAL:  The patient is in no distress.  VITAL SIGNS:  Blood pressure 153/95, on my check blood pressure is  160/62, pulse is 61 and regular, weight 295, up from 291 a year ago.  Back in 2005 the patient weighed 258.  LUNGS:  Clear.  NECK:  JVP is normal.  CARDIAC:  Regular rate and rhythm. Grade 1-2/6 systolic murmur heard  best at the left sternal border.  ABDOMEN:  Obese, benign.  EXTREMITIES:  No edema.   IMPRESSION:  1. Mitral  regurgitation. Would continue to follow. Will need to get      better control of his blood pressure. Echocardiogram probably in 9      months.  2. Hypertension. Not adequately controlled. The patient is going to      check it regularly and mail in the numbers to me. Again, I talked      to him about weight loss. I will try to get the records also from      Dr. Claris Che office regarding his blood pressures as well. If he is      running high, I need to see him sooner.  3. Dyslipidemia. Total cholesterol on last check was 150, LDL 94,      triglycerides 99, HDL 36. Again encouraged increased activity.     Pricilla Riffle, MD, [facility]  Electronically Signed    PVR/MedQ  DD: 07/10/2006  DT: 07/10/2006  Job #:  [id number]   cc:   Tera Mater. Clent Ridges, MD

## 2010-07-23 NOTE — Assessment & Plan Note (Signed)
Continue CPAP.  

## 2010-07-23 NOTE — Assessment & Plan Note (Signed)
I have reviewed recent echo.  A pericardial effusion is present . He has not had this before.  Deneis symptoms.  MR is unchanged.  Will have him check labs. (BMET, BNP, CBC, ANA, ESR.  TSH)  Will need to be followed

## 2010-07-23 NOTE — Assessment & Plan Note (Signed)
Continue statin.  Will need to review.  Probably switch to Lipitor.

## 2010-07-26 ENCOUNTER — Ambulatory Visit (INDEPENDENT_AMBULATORY_CARE_PROVIDER_SITE_OTHER): Payer: Managed Care, Other (non HMO) | Admitting: *Deleted

## 2010-07-26 ENCOUNTER — Other Ambulatory Visit (INDEPENDENT_AMBULATORY_CARE_PROVIDER_SITE_OTHER): Payer: Managed Care, Other (non HMO) | Admitting: *Deleted

## 2010-07-26 DIAGNOSIS — I4891 Unspecified atrial fibrillation: Secondary | ICD-10-CM

## 2010-07-26 DIAGNOSIS — I359 Nonrheumatic aortic valve disorder, unspecified: Secondary | ICD-10-CM

## 2010-07-26 LAB — BASIC METABOLIC PANEL
Calcium: 10.2 mg/dL (ref 8.4–10.5)
Chloride: 104 mEq/L (ref 96–112)
Creatinine, Ser: 0.9 mg/dL (ref 0.4–1.5)
GFR: 87.93 mL/min (ref >60.00)

## 2010-07-26 LAB — CBC WITH DIFFERENTIAL/PLATELET
Basophils Absolute: 0 10*3/uL (ref 0.0–0.1)
Eosinophils Absolute: 0.1 10*3/uL (ref 0.0–0.7)
Hemoglobin: 13.6 g/dL (ref 13.0–17.0)
Lymphocytes Relative: 32.8 % (ref 12.0–46.0)
Lymphs Abs: 1.9 10*3/uL (ref 0.7–4.0)
MCHC: 35.1 g/dL (ref 30.0–36.0)
Neutro Abs: 3.3 10*3/uL (ref 1.4–7.7)
RDW: 13.2 % (ref 11.5–14.6)

## 2010-07-26 LAB — TSH: TSH: 1.02 u[IU]/mL (ref 0.35–5.50)

## 2010-07-26 LAB — BRAIN NATRIURETIC PEPTIDE: Pro B Natriuretic peptide (BNP): 99 pg/mL (ref 0.0–100.0)

## 2010-07-29 ENCOUNTER — Telehealth: Payer: Self-pay | Admitting: *Deleted

## 2010-07-29 DIAGNOSIS — I350 Nonrheumatic aortic (valve) stenosis: Secondary | ICD-10-CM

## 2010-07-29 DIAGNOSIS — E7800 Pure hypercholesterolemia, unspecified: Secondary | ICD-10-CM

## 2010-07-29 NOTE — Telephone Encounter (Signed)
LMOM for call back. Will send referral for a limited echo.

## 2010-07-30 MED ORDER — ATORVASTATIN CALCIUM 20 MG PO TABS: 20.0000 mg | ORAL_TABLET | Freq: Every day | ORAL | Status: DC

## 2010-07-30 NOTE — Telephone Encounter (Signed)
Pt returning your call. Pt states you can call him on hid cell.

## 2010-07-30 NOTE — Telephone Encounter (Signed)
Patient aware for the need for another echo.

## 2010-07-30 NOTE — Telephone Encounter (Signed)
Called patient with lab results and advised him that Dr.Ross would like him to stop taking Simvastatin and start Lipitor 20mg . He verbalized understanding and will let us know if he does not tolerate the Lipitor.

## 2010-08-16 ENCOUNTER — Ambulatory Visit (HOSPITAL_COMMUNITY): Payer: Managed Care, Other (non HMO) | Attending: Internal Medicine | Admitting: Radiology

## 2010-08-16 DIAGNOSIS — E785 Hyperlipidemia, unspecified: Secondary | ICD-10-CM | POA: Insufficient documentation

## 2010-08-16 DIAGNOSIS — I080 Rheumatic disorders of both mitral and aortic valves: Secondary | ICD-10-CM | POA: Insufficient documentation

## 2010-08-16 DIAGNOSIS — I079 Rheumatic tricuspid valve disease, unspecified: Secondary | ICD-10-CM | POA: Insufficient documentation

## 2010-08-16 DIAGNOSIS — I350 Nonrheumatic aortic (valve) stenosis: Secondary | ICD-10-CM

## 2010-08-16 DIAGNOSIS — I10 Essential (primary) hypertension: Secondary | ICD-10-CM | POA: Insufficient documentation

## 2010-08-16 DIAGNOSIS — I319 Disease of pericardium, unspecified: Secondary | ICD-10-CM | POA: Insufficient documentation

## 2010-08-23 ENCOUNTER — Ambulatory Visit (INDEPENDENT_AMBULATORY_CARE_PROVIDER_SITE_OTHER): Payer: Managed Care, Other (non HMO) | Admitting: *Deleted

## 2010-08-23 DIAGNOSIS — I4891 Unspecified atrial fibrillation: Secondary | ICD-10-CM

## 2010-08-27 ENCOUNTER — Telehealth: Payer: Self-pay | Admitting: Internal Medicine

## 2010-08-27 NOTE — Telephone Encounter (Signed)
Dr.Ross called patient with echo results.

## 2010-08-27 NOTE — Telephone Encounter (Signed)
Pt requesting echo results

## 2010-09-20 ENCOUNTER — Ambulatory Visit (INDEPENDENT_AMBULATORY_CARE_PROVIDER_SITE_OTHER): Payer: Managed Care, Other (non HMO) | Admitting: *Deleted

## 2010-09-20 DIAGNOSIS — I4891 Unspecified atrial fibrillation: Secondary | ICD-10-CM

## 2010-09-20 LAB — POCT INR: INR: 2.1

## 2010-09-30 ENCOUNTER — Other Ambulatory Visit: Payer: Self-pay | Admitting: Family Medicine

## 2010-09-30 MED ORDER — AMLODIPINE BESYLATE 10 MG PO TABS: 10.0000 mg | ORAL_TABLET | Freq: Every day | ORAL | Status: DC

## 2010-09-30 NOTE — Telephone Encounter (Signed)
Script sent, e-scribe. 

## 2010-10-07 ENCOUNTER — Other Ambulatory Visit: Payer: Self-pay | Admitting: Internal Medicine

## 2010-10-18 ENCOUNTER — Ambulatory Visit (INDEPENDENT_AMBULATORY_CARE_PROVIDER_SITE_OTHER): Payer: Managed Care, Other (non HMO) | Admitting: *Deleted

## 2010-10-18 DIAGNOSIS — I4891 Unspecified atrial fibrillation: Secondary | ICD-10-CM

## 2010-10-26 ENCOUNTER — Other Ambulatory Visit: Payer: Self-pay | Admitting: Family Medicine

## 2010-10-26 NOTE — Telephone Encounter (Signed)
Open in error

## 2010-10-26 NOTE — Telephone Encounter (Signed)
Refill request for Hydrochlorothiazide 12.5 mg, how should this be taken? It is listed two different ways. Please advise?

## 2010-10-27 ENCOUNTER — Other Ambulatory Visit: Payer: Self-pay | Admitting: Family Medicine

## 2010-10-28 ENCOUNTER — Telehealth: Payer: Self-pay | Admitting: Family Medicine

## 2010-10-28 NOTE — Telephone Encounter (Signed)
He is actually taking 2 of the 12.5 tablets a day, so change the rx to 25 mg tabs, to take one a day. Send in a one year supply

## 2010-10-28 NOTE — Telephone Encounter (Signed)
No medication was listed.

## 2010-10-28 NOTE — Telephone Encounter (Signed)
Refill request for HCTZ 12.5 mg. The directions from pharmacy is to take 1 po and our medication list is take 2 po. Which is correct?

## 2010-10-29 ENCOUNTER — Other Ambulatory Visit: Payer: Self-pay | Admitting: Family Medicine

## 2010-10-29 MED ORDER — HYDROCHLOROTHIAZIDE 25 MG PO TABS: 25.0000 mg | ORAL_TABLET | Freq: Every day | ORAL | Status: DC

## 2010-10-29 NOTE — Telephone Encounter (Signed)
New script called in

## 2010-11-01 ENCOUNTER — Ambulatory Visit (INDEPENDENT_AMBULATORY_CARE_PROVIDER_SITE_OTHER): Payer: Managed Care, Other (non HMO) | Admitting: *Deleted

## 2010-11-01 ENCOUNTER — Telehealth: Payer: Self-pay | Admitting: Family Medicine

## 2010-11-01 DIAGNOSIS — I4891 Unspecified atrial fibrillation: Secondary | ICD-10-CM

## 2010-11-01 NOTE — Telephone Encounter (Signed)
Pt called and said that he is only taking 1 of the 12.5 mg of HCTZ a day. Pts cardiologist changed this to 12.5 per day approx 1-2 yrs ago. Pls call Karin Golden on [address] and [employer] and correct dosage to HCTZ 12.5 1 per day.

## 2010-11-02 ENCOUNTER — Telehealth: Payer: Self-pay | Admitting: Family Medicine

## 2010-11-02 MED ORDER — HYDROCHLOROTHIAZIDE 12.5 MG PO TABS: 12.5000 mg | ORAL_TABLET | Freq: Every day | ORAL | Status: DC

## 2010-11-02 NOTE — Telephone Encounter (Signed)
done

## 2010-11-02 NOTE — Telephone Encounter (Signed)
Done

## 2010-11-02 NOTE — Telephone Encounter (Signed)
I called in new script and left message for pt.

## 2010-11-11 ENCOUNTER — Telehealth: Payer: Self-pay | Admitting: Family Medicine

## 2010-11-11 MED ORDER — LISINOPRIL 20 MG PO TABS: 20.0000 mg | ORAL_TABLET | Freq: Two times a day (BID) | ORAL | Status: DC

## 2010-11-11 NOTE — Telephone Encounter (Signed)
Script sent e-scribe 

## 2010-11-20 ENCOUNTER — Other Ambulatory Visit: Payer: Self-pay | Admitting: Family Medicine

## 2010-11-22 ENCOUNTER — Ambulatory Visit (INDEPENDENT_AMBULATORY_CARE_PROVIDER_SITE_OTHER): Payer: Managed Care, Other (non HMO) | Admitting: *Deleted

## 2010-11-22 DIAGNOSIS — I4891 Unspecified atrial fibrillation: Secondary | ICD-10-CM

## 2010-11-22 LAB — POCT INR: INR: 1.8

## 2010-11-24 NOTE — Telephone Encounter (Signed)
Pt last seen 07/12/10. Pls advise.

## 2010-11-25 ENCOUNTER — Telehealth: Payer: Self-pay | Admitting: Family Medicine

## 2010-11-25 MED ORDER — CLONIDINE HCL 0.1 MG PO TABS: 0.1000 mg | ORAL_TABLET | Freq: Two times a day (BID) | ORAL | Status: DC

## 2010-11-25 NOTE — Telephone Encounter (Signed)
Script called in

## 2010-11-25 NOTE — Telephone Encounter (Signed)
Refill request for Clonidine HCL 0.1 mg take 1 po bid. Pt last here on 04/23/10 and script last filled on 10/18/10.

## 2010-11-25 NOTE — Telephone Encounter (Signed)
Refill for one year 

## 2010-12-06 ENCOUNTER — Other Ambulatory Visit (INDEPENDENT_AMBULATORY_CARE_PROVIDER_SITE_OTHER): Payer: Managed Care, Other (non HMO)

## 2010-12-06 DIAGNOSIS — Z0000 Encounter for general adult medical examination without abnormal findings: Secondary | ICD-10-CM

## 2010-12-06 LAB — CBC WITH DIFFERENTIAL/PLATELET
Basophils Relative: 0.4 % (ref 0.0–3.0)
Eosinophils Absolute: 0.1 10*3/uL (ref 0.0–0.7)
HCT: 42.3 % (ref 39.0–52.0)
Hemoglobin: 14.3 g/dL (ref 13.0–17.0)
Lymphocytes Relative: 29.9 % (ref 12.0–46.0)
Lymphs Abs: 1.9 10*3/uL (ref 0.7–4.0)
MCHC: 33.8 g/dL (ref 30.0–36.0)
Monocytes Relative: 8.7 % (ref 3.0–12.0)
Neutro Abs: 3.8 10*3/uL (ref 1.4–7.7)
RBC: 4.48 Mil/uL (ref 4.22–5.81)
RDW: 13.4 % (ref 11.5–14.6)

## 2010-12-06 LAB — HEPATIC FUNCTION PANEL
Bilirubin, Direct: 0.1 mg/dL (ref 0.0–0.3)
Total Bilirubin: 0.7 mg/dL (ref 0.3–1.2)

## 2010-12-06 LAB — POCT URINALYSIS DIPSTICK
Glucose, UA: NEGATIVE
Nitrite, UA: NEGATIVE
Protein, UA: NEGATIVE
Spec Grav, UA: 1.015
Urobilinogen, UA: 0.2

## 2010-12-06 LAB — PSA: PSA: 0.23 ng/mL (ref 0.10–4.00)

## 2010-12-06 LAB — TSH: TSH: 1.62 u[IU]/mL (ref 0.35–5.50)

## 2010-12-06 LAB — LIPID PANEL
HDL: 41.5 mg/dL (ref >39.00)
Total CHOL/HDL Ratio: 4
VLDL: 18 mg/dL (ref 0.0–40.0)

## 2010-12-06 LAB — BASIC METABOLIC PANEL
CO2: 26 mEq/L (ref 19–32)
Calcium: 10.3 mg/dL (ref 8.4–10.5)
Glucose, Bld: 117 mg/dL — ABNORMAL HIGH (ref 70–99)
Potassium: 4.2 mEq/L (ref 3.5–5.1)
Sodium: 139 mEq/L (ref 135–145)

## 2010-12-13 ENCOUNTER — Ambulatory Visit (INDEPENDENT_AMBULATORY_CARE_PROVIDER_SITE_OTHER): Payer: Managed Care, Other (non HMO) | Admitting: *Deleted

## 2010-12-13 ENCOUNTER — Ambulatory Visit (INDEPENDENT_AMBULATORY_CARE_PROVIDER_SITE_OTHER): Payer: Managed Care, Other (non HMO) | Admitting: Family Medicine

## 2010-12-13 ENCOUNTER — Encounter: Payer: Self-pay | Admitting: Family Medicine

## 2010-12-13 VITALS — BP 130/80 | HR 62 | Temp 98.0°F | Ht 70.25 in | Wt 290.0 lb

## 2010-12-13 DIAGNOSIS — I4891 Unspecified atrial fibrillation: Secondary | ICD-10-CM

## 2010-12-13 DIAGNOSIS — Z0000 Encounter for general adult medical examination without abnormal findings: Secondary | ICD-10-CM

## 2010-12-13 LAB — POCT INR: INR: 2.3

## 2010-12-13 MED ORDER — LISINOPRIL 20 MG PO TABS: 40.0000 mg | ORAL_TABLET | Freq: Every day | ORAL | Status: DC

## 2010-12-13 MED ORDER — AMLODIPINE BESYLATE 10 MG PO TABS: 10.0000 mg | ORAL_TABLET | Freq: Every day | ORAL | Status: DC

## 2010-12-13 MED ORDER — HALOBETASOL PROPIONATE 0.05 % EX CREA: TOPICAL_CREAM | Freq: Two times a day (BID) | CUTANEOUS | Status: DC

## 2010-12-13 MED ORDER — CARVEDILOL 12.5 MG PO TABS: 12.5000 mg | ORAL_TABLET | Freq: Every day | ORAL | Status: DC

## 2010-12-13 MED ORDER — POLYETHYLENE GLYCOL 3350 17 GM/SCOOP PO POWD: 17.0000 g | Freq: Every day | ORAL | Status: AC

## 2010-12-13 NOTE — Progress Notes (Signed)
  Subjective:    Patient ID: [name], male    DOB: [birth year], 60 y.o.   MRN: [id number]  HPI 60 yr old male for a cpx. He feels fine but has some questions. He sees Dr. Tenny Craw frequently for his atrial fibrillation. He has trouble with constipation at times, and he gets only partial relief with Colace OTC. He has had an itchy rash on the left knee all summer. He wears an elastic support sleeve on this knee when at work.    Review of Systems  Constitutional: Negative.   HENT: Negative.   Eyes: Negative.   Respiratory: Negative.   Cardiovascular: Negative.   Gastrointestinal: Positive for constipation. Negative for nausea, vomiting, abdominal pain, diarrhea, blood in stool, abdominal distention, anal bleeding and rectal pain.  Genitourinary: Negative.   Musculoskeletal: Negative.   Skin: Positive for rash. Negative for color change, pallor and wound.  Neurological: Negative.   Hematological: Negative.   Psychiatric/Behavioral: Negative.        Objective:   Physical Exam  Constitutional: He is oriented to person, place, and time. He appears well-developed. No distress.       Morbidly obese   HENT:  Head: Normocephalic and atraumatic.  Right Ear: External ear normal.  Left Ear: External ear normal.  Nose: Nose normal.  Mouth/Throat: Oropharynx is clear and moist. No oropharyngeal exudate.  Eyes: Conjunctivae and EOM are normal. Pupils are equal, round, and reactive to light. Right eye exhibits no discharge. Left eye exhibits no discharge. No scleral icterus.  Neck: Neck supple. No JVD present. No tracheal deviation present. No thyromegaly present.  Cardiovascular: Normal rate, regular rhythm, normal heart sounds and intact distal pulses.  Exam reveals no gallop and no friction rub.   No murmur heard. Pulmonary/Chest: Effort normal and breath sounds normal. No respiratory distress. He has no wheezes. He has no rales. He exhibits no tenderness.  Abdominal: Soft. Bowel sounds are  normal. He exhibits no distension and no mass. There is no tenderness. There is no rebound and no guarding.  Genitourinary: Rectum normal, prostate normal and penis normal. Guaiac negative stool. No penile tenderness.  Musculoskeletal: Normal range of motion. He exhibits no edema and no tenderness.  Lymphadenopathy:    He has no cervical adenopathy.  Neurological: He is alert and oriented to person, place, and time. He has normal reflexes. No cranial nerve deficit. He exhibits normal muscle tone. Coordination normal.  Skin: Skin is warm and dry. Rash noted. He is not diaphoretic. No erythema. No pallor.       Macular scaly red rash on the anterior left knee   Psychiatric: He has a normal mood and affect. His behavior is normal. Judgment and thought content normal.          Assessment & Plan:  He needs to exercise and lose weight. His glucose is still elevated, and we discussed eliminating a lot of the carbs from his diet.

## 2010-12-14 ENCOUNTER — Telehealth: Payer: Self-pay | Admitting: Family Medicine

## 2010-12-14 NOTE — Telephone Encounter (Signed)
Pt called and said that there is a problem with the carvedilol (COREG) 12.5 MG tablet script. Original script was 25 mg bid. Pls change Coreg back to 25 mg bid and put refills on script. Haze Rushing at [employer].

## 2010-12-14 NOTE — Telephone Encounter (Signed)
Message copied by Baldemar Friday on Tue Dec 14, 2010  9:42 AM ------      Message from: Gershon Crane A      Created: Sun Dec 12, 2010  3:50 PM       Normal except elevated glucose. Watch  the diet

## 2010-12-14 NOTE — Telephone Encounter (Signed)
Left voice message with results.

## 2010-12-18 ENCOUNTER — Other Ambulatory Visit: Payer: Self-pay | Admitting: Family Medicine

## 2010-12-21 NOTE — Telephone Encounter (Signed)
Pt is concerned that this is not the correct dosage. Please advise. He was give a rx with 11 refills at his last office visit.

## 2010-12-24 ENCOUNTER — Telehealth: Payer: Self-pay | Admitting: Family Medicine

## 2010-12-24 ENCOUNTER — Other Ambulatory Visit: Payer: Self-pay

## 2010-12-24 NOTE — Telephone Encounter (Signed)
Spoke with pharmacist and there is a question about the Coreg. Dr. Clent Ridges suggested that the pt contact the provider for refills on that particular medication.

## 2010-12-27 ENCOUNTER — Other Ambulatory Visit: Payer: Self-pay | Admitting: *Deleted

## 2010-12-27 DIAGNOSIS — I10 Essential (primary) hypertension: Secondary | ICD-10-CM

## 2010-12-27 MED ORDER — CARVEDILOL 25 MG PO TABS: 25.0000 mg | ORAL_TABLET | Freq: Two times a day (BID) | ORAL | Status: DC

## 2011-01-10 ENCOUNTER — Ambulatory Visit (INDEPENDENT_AMBULATORY_CARE_PROVIDER_SITE_OTHER): Payer: Managed Care, Other (non HMO) | Admitting: *Deleted

## 2011-01-10 DIAGNOSIS — I4891 Unspecified atrial fibrillation: Secondary | ICD-10-CM

## 2011-01-10 LAB — POCT INR: INR: 1.9

## 2011-01-20 ENCOUNTER — Telehealth: Payer: Self-pay | Admitting: Internal Medicine

## 2011-01-20 NOTE — Telephone Encounter (Signed)
Pt aware that Dr Tenny Craw and her nurse are out of the office today.  Pt is agreeable to waiting for Dr Tenny Craw.

## 2011-01-20 NOTE — Telephone Encounter (Signed)
Pt calling wanting to know if pt needs to have ECHO prior to pt appt on 11/26. Please return pt call to discuss further.

## 2011-01-21 ENCOUNTER — Encounter: Payer: Self-pay | Admitting: Family Medicine

## 2011-01-21 ENCOUNTER — Ambulatory Visit (INDEPENDENT_AMBULATORY_CARE_PROVIDER_SITE_OTHER): Payer: Managed Care, Other (non HMO) | Admitting: Family Medicine

## 2011-01-21 VITALS — BP 140/90 | HR 71 | Temp 97.7°F | Wt 285.0 lb

## 2011-01-21 DIAGNOSIS — I10 Essential (primary) hypertension: Secondary | ICD-10-CM

## 2011-01-21 DIAGNOSIS — I4891 Unspecified atrial fibrillation: Secondary | ICD-10-CM

## 2011-01-21 MED ORDER — VALSARTAN 320 MG PO TABS: 320.0000 mg | ORAL_TABLET | Freq: Every day | ORAL | Status: DC

## 2011-01-21 NOTE — Progress Notes (Signed)
  Subjective:    Patient ID: [name], male    DOB: 1950/05/13, 60 y.o.   MRN: [id number]  HPI Here for elevated BPs over the past few weeks. He feels fine. No changes in meds or diet. He has gotten a lot of systolic readings in the 140-160 range.    Review of Systems  Constitutional: Negative.   Respiratory: Negative.   Cardiovascular: Negative.        Objective:   Physical Exam  Constitutional: He appears well-developed and well-nourished.  Neck: No thyromegaly present.  Cardiovascular: Normal rate, regular rhythm, normal heart sounds and intact distal pulses.   Pulmonary/Chest: Effort normal and breath sounds normal.  Lymphadenopathy:    He has no cervical adenopathy.          Assessment & Plan:  switch from Lisinopril to Valsartan daily. He is to see Dr. Tenny Craw in 2 weeks so they can recheck the BP then

## 2011-01-23 NOTE — Telephone Encounter (Signed)
No echo.  Keep clinic appt.

## 2011-01-24 ENCOUNTER — Telehealth: Payer: Self-pay | Admitting: Family Medicine

## 2011-01-24 NOTE — Telephone Encounter (Signed)
Dr. Clent Ridges - this pt's Diovan 320mg  was denied by [facility]. They say it is because there is not documentation to state that he has "failure, contraindication or intolerance to 2 different generic ACE-inhibitors or ARB anti-hypertensive meds such as lisinopril, enalapril, or losartan".  I found in the chart, and sent to [facility], that he has already tried Benicar and lisinopril. Please let me know if you want to switch him to one of the other meds or start the appeal process to try & get Diovan approved. Thanks!

## 2011-01-24 NOTE — Telephone Encounter (Signed)
He needs the Diovan so try to get it approved. Thanks

## 2011-01-24 NOTE — Telephone Encounter (Signed)
Pt called, made aware echo not needed

## 2011-01-26 ENCOUNTER — Other Ambulatory Visit: Payer: Self-pay | Admitting: Internal Medicine

## 2011-01-26 DIAGNOSIS — I10 Essential (primary) hypertension: Secondary | ICD-10-CM

## 2011-01-26 MED ORDER — CARVEDILOL 25 MG PO TABS: 25.0000 mg | ORAL_TABLET | Freq: Two times a day (BID) | ORAL | Status: DC

## 2011-01-31 ENCOUNTER — Encounter: Payer: Self-pay | Admitting: Internal Medicine

## 2011-01-31 ENCOUNTER — Ambulatory Visit (INDEPENDENT_AMBULATORY_CARE_PROVIDER_SITE_OTHER): Payer: Managed Care, Other (non HMO) | Admitting: Internal Medicine

## 2011-01-31 VITALS — BP 130/80 | HR 56 | Ht 72.0 in | Wt 285.0 lb

## 2011-01-31 DIAGNOSIS — I4891 Unspecified atrial fibrillation: Secondary | ICD-10-CM

## 2011-01-31 DIAGNOSIS — I10 Essential (primary) hypertension: Secondary | ICD-10-CM

## 2011-01-31 DIAGNOSIS — I059 Rheumatic mitral valve disease, unspecified: Secondary | ICD-10-CM

## 2011-01-31 DIAGNOSIS — R0789 Other chest pain: Secondary | ICD-10-CM

## 2011-01-31 DIAGNOSIS — E785 Hyperlipidemia, unspecified: Secondary | ICD-10-CM

## 2011-01-31 DIAGNOSIS — G4730 Sleep apnea, unspecified: Secondary | ICD-10-CM

## 2011-01-31 NOTE — Progress Notes (Addendum)
Patient ID: [name], male   DOB: [birth year], 60 y.o.   MRN: [id number] HPIPatient is a 60 year old with a history of mitral regurgitation, hypertension, atrial fibrillation, sleep apnea and obesity. I saw him in clinic last spring After I saw him he had an echo that showed mild MR> He was recently seen by S. SOuth in clinic.  BP was up.  He was switched to valsartin. However he did not start this a insurance would not cover it  Remains on Lisinopril. Breathing has good.  No CP.   Staying active.  No palpitaitons.  No dizziness.  Using CPAP. Allergies  Allergen Reactions  . Penicillins     REACTION: unspecified      Past Medical History  Diagnosis Date  . Obesity   . Hypertension   . Heart valve problem     Dr [name]  . Atrial fibrillation   . Other and unspecified hyperlipidemia   . OSA (obstructive sleep apnea)   . Other and unspecified hyperlipidemia   . Mitral valve disorders     Past Surgical History  Procedure Date  . Colonoscopy 03-16-10    per Dr. Russella Dar, benign polyp, repeat in 5 yrs     Family History  Problem Relation Age of Onset  . Stroke Father   . Coronary artery disease    . Hypertension      History   Social History  . Marital Status: Single    Spouse Name: N/A    Number of Children: N/A  . Years of Education: N/A   Occupational History  . Not on file.   Social History Main Topics  . Smoking status: Never Smoker   . Smokeless tobacco: Never Used  . Alcohol Use: Yes  . Drug Use: No  . Sexually Active: Not on file   Other Topics Concern  . Not on file   Social History Narrative  . No narrative on file    Review of Systems:  All systems reviewed.  They are negative to the above problem except as previously stated.  Vital Signs: BP 130/80  Pulse 56  Ht 6' (1.829 m)  Wt 285 lb (129.275 kg)  BMI 38.65 kg/m2  Physical Exam  HEENT:  Normocephalic, atraumatic. EOMI, PERRLA.  Neck: JVP is normal. No thyromegaly. No bruits.  Lungs:  clear to auscultation. No rales no wheezes.  Heart: Regular rate and rhythm. Normal S1, S2. No S3.   No significant murmurs. PMI not displaced.  Abdomen:  Supple, nontender. Normal bowel sounds. No masses. No hepatomegaly.  Extremities:   Good distal pulses throughout. No lower extremity edema.  Musculoskeletal :moving all extremities.  Neuro:   alert and oriented x3.  CN II-XII grossly intact.  EKG:  Atrial fibrillation  56 bpm.  LAFB.   Assessment and Plan:

## 2011-02-04 MED ORDER — LOSARTAN POTASSIUM 100 MG PO TABS: 100.0000 mg | ORAL_TABLET | Freq: Every day | ORAL | Status: DC

## 2011-02-04 NOTE — Telephone Encounter (Signed)
Dr. Clent Ridges - I am happy to start the appeal process. However, the rejection reason was stated as, "unable to provide coverage for this medication (Diovan) because there is no clinical information provided indicating that your patient has failure, contraindication, or intolerance to (2) different ACE-inhibitors or ARB anti-hypertensive medications such as lisinopril, enalapril, or losartan".  If you have evidence to dispute this, please type a note back to me letting me know what I should say in the letter to document that the patient HAS experienced what Rosann Auerbach stated above. If he has NOT, then appealing it will not likely work. Thanks!

## 2011-02-04 NOTE — Telephone Encounter (Signed)
You know what, just forget about the Diovan. Thanks for trying. I thought he had tried Losartan but maybe not. I will change from Diovan to Losartan and will email a rx in for him to try.

## 2011-02-04 NOTE — Telephone Encounter (Signed)
Thanks for responding - will do.

## 2011-02-07 ENCOUNTER — Ambulatory Visit (INDEPENDENT_AMBULATORY_CARE_PROVIDER_SITE_OTHER): Payer: Managed Care, Other (non HMO) | Admitting: *Deleted

## 2011-02-07 DIAGNOSIS — I4891 Unspecified atrial fibrillation: Secondary | ICD-10-CM

## 2011-02-07 LAB — POCT INR: INR: 1.8

## 2011-02-08 NOTE — Assessment & Plan Note (Addendum)
BP is under fair control.  He is on lisinoprl still.    Keep on same regimen for now.  Encouraged wt loss.

## 2011-02-08 NOTE — Assessment & Plan Note (Signed)
Cont CPAP

## 2011-02-08 NOTE — Assessment & Plan Note (Signed)
Keep on statin. 

## 2011-02-08 NOTE — Assessment & Plan Note (Signed)
Cont rae contrl and coumadin

## 2011-02-08 NOTE — Assessment & Plan Note (Signed)
Cont to follow

## 2011-02-09 ENCOUNTER — Other Ambulatory Visit: Payer: Self-pay | Admitting: Family Medicine

## 2011-02-15 ENCOUNTER — Other Ambulatory Visit: Payer: Self-pay | Admitting: Pharmacist

## 2011-02-15 MED ORDER — WARFARIN SODIUM 5 MG PO TABS: ORAL_TABLET | ORAL | Status: DC

## 2011-02-16 ENCOUNTER — Ambulatory Visit (INDEPENDENT_AMBULATORY_CARE_PROVIDER_SITE_OTHER): Payer: Managed Care, Other (non HMO) | Admitting: Family Medicine

## 2011-02-16 ENCOUNTER — Encounter: Payer: Self-pay | Admitting: Family Medicine

## 2011-02-16 VITALS — BP 130/80 | HR 79 | Temp 97.9°F | Wt 284.0 lb

## 2011-02-16 DIAGNOSIS — I10 Essential (primary) hypertension: Secondary | ICD-10-CM

## 2011-02-16 NOTE — Progress Notes (Signed)
  Subjective:    Patient ID: [name], male    DOB: 1950-04-14, 60 y.o.   MRN: [id number]  HPI Here to recheck his BP. We had intended to switch him from Losartan to Valsartan for his HTN, but his insurance company would not cover it. So far his BP seems tpo be stable and he feels well.   Review of Systems  Constitutional: Negative.   Respiratory: Negative.   Cardiovascular: Negative.        Objective:   Physical Exam  Constitutional: He appears well-developed and well-nourished.  Neck: No thyromegaly present.  Cardiovascular: Normal rate, regular rhythm, normal heart sounds and intact distal pulses.   Pulmonary/Chest: Effort normal and breath sounds normal.  Lymphadenopathy:    He has no cervical adenopathy.          Assessment & Plan:  He seems to be doing well on Losartan. Watch the BP closely. Recheck in 90 day s

## 2011-02-21 ENCOUNTER — Ambulatory Visit (INDEPENDENT_AMBULATORY_CARE_PROVIDER_SITE_OTHER): Payer: Managed Care, Other (non HMO) | Admitting: *Deleted

## 2011-02-21 DIAGNOSIS — I4891 Unspecified atrial fibrillation: Secondary | ICD-10-CM

## 2011-02-21 LAB — POCT INR: INR: 2.2

## 2011-03-21 ENCOUNTER — Ambulatory Visit (INDEPENDENT_AMBULATORY_CARE_PROVIDER_SITE_OTHER): Payer: Managed Care, Other (non HMO) | Admitting: *Deleted

## 2011-03-21 DIAGNOSIS — I4891 Unspecified atrial fibrillation: Secondary | ICD-10-CM

## 2011-03-21 LAB — POCT INR: INR: 2.2

## 2011-04-18 ENCOUNTER — Ambulatory Visit (INDEPENDENT_AMBULATORY_CARE_PROVIDER_SITE_OTHER): Payer: Managed Care, Other (non HMO) | Admitting: *Deleted

## 2011-04-18 DIAGNOSIS — Z7901 Long term (current) use of anticoagulants: Secondary | ICD-10-CM

## 2011-04-18 DIAGNOSIS — I4891 Unspecified atrial fibrillation: Secondary | ICD-10-CM

## 2011-05-16 ENCOUNTER — Ambulatory Visit (INDEPENDENT_AMBULATORY_CARE_PROVIDER_SITE_OTHER): Payer: Managed Care, Other (non HMO) | Admitting: Pharmacist

## 2011-05-16 DIAGNOSIS — I4891 Unspecified atrial fibrillation: Secondary | ICD-10-CM

## 2011-05-16 DIAGNOSIS — Z7901 Long term (current) use of anticoagulants: Secondary | ICD-10-CM

## 2011-05-16 LAB — POCT INR: INR: 2.8

## 2011-05-17 ENCOUNTER — Other Ambulatory Visit: Payer: Self-pay | Admitting: Internal Medicine

## 2011-06-20 ENCOUNTER — Encounter: Payer: Self-pay | Admitting: Pulmonary Disease

## 2011-06-20 ENCOUNTER — Ambulatory Visit (INDEPENDENT_AMBULATORY_CARE_PROVIDER_SITE_OTHER): Payer: Managed Care, Other (non HMO) | Admitting: Pulmonary Disease

## 2011-06-20 VITALS — BP 108/66 | HR 51 | Temp 97.8°F | Ht 72.0 in | Wt 283.0 lb

## 2011-06-20 DIAGNOSIS — G4733 Obstructive sleep apnea (adult) (pediatric): Secondary | ICD-10-CM

## 2011-06-20 NOTE — Progress Notes (Signed)
  Subjective:    Patient ID: [name], male    DOB: [birth year], 61 y.o.   MRN: [id number]  HPI The pt comes in today for f/u of his known OSA.  He is wearing cpap compliantly, and is having no issues with mask fit or pressure.  He will awaken at night at times, and pull the mask off to sleep an additional few hours.  He forgets that he can use ramp button to reset pressure.  He feels that he sleep fairly well, and is satisfied with his daytime alertness.  His weight is down 3 pounds.    Review of Systems  Constitutional: Negative.  Negative for fever and unexpected weight change.  HENT: Negative.  Negative for ear pain, nosebleeds, congestion, sore throat, rhinorrhea, sneezing, trouble swallowing, dental problem, postnasal drip and sinus pressure.   Eyes: Negative.  Negative for redness and itching.  Respiratory: Negative.  Negative for cough, chest tightness, shortness of breath and wheezing.   Cardiovascular: Negative.  Negative for palpitations and leg swelling.  Gastrointestinal: Negative.  Negative for nausea and vomiting.  Genitourinary: Negative.  Negative for dysuria.  Musculoskeletal: Negative.  Negative for joint swelling.  Skin: Negative.  Negative for rash.  Neurological: Negative.  Negative for headaches.  Hematological: Negative.  Does not bruise/bleed easily.  Psychiatric/Behavioral: Negative.  Negative for dysphoric mood. The patient is not nervous/anxious.        Objective:   Physical Exam Obese male in nad No skin breakdown or pressure necrosis from cpap mask LE with mild edema, no cyanosis Alert, does not appear sleepy, moves all 4.        Assessment & Plan:

## 2011-06-20 NOTE — Patient Instructions (Signed)
Stay on cpap at night as much as possible, and consider using ramp button to help you get back to sleep once you awaken. Work on weight loss followup with me in one year, but call if having issues with cpap.

## 2011-06-20 NOTE — Assessment & Plan Note (Signed)
The patient seems to be doing very well on his current CPAP.  He is having no issues with mask fit, and feels that his sleep and daytime alertness are adequate.  He will awaken at times during the night, and will sometimes pull the mask off rather than using the ramp button to reset his pressure, but he will try to make more of an effort to do this.  He is also continued to have a decrease in his white, although it is fairly slowly.

## 2011-06-27 ENCOUNTER — Ambulatory Visit (INDEPENDENT_AMBULATORY_CARE_PROVIDER_SITE_OTHER): Payer: Managed Care, Other (non HMO) | Admitting: Pharmacist

## 2011-06-27 DIAGNOSIS — Z7901 Long term (current) use of anticoagulants: Secondary | ICD-10-CM

## 2011-06-27 DIAGNOSIS — I4891 Unspecified atrial fibrillation: Secondary | ICD-10-CM

## 2011-06-27 LAB — POCT INR: INR: 2.6

## 2011-07-22 ENCOUNTER — Other Ambulatory Visit: Payer: Self-pay | Admitting: Internal Medicine

## 2011-07-23 ENCOUNTER — Other Ambulatory Visit: Payer: Self-pay | Admitting: Internal Medicine

## 2011-08-08 ENCOUNTER — Ambulatory Visit (INDEPENDENT_AMBULATORY_CARE_PROVIDER_SITE_OTHER): Payer: Managed Care, Other (non HMO)

## 2011-08-08 DIAGNOSIS — Z7901 Long term (current) use of anticoagulants: Secondary | ICD-10-CM

## 2011-08-08 DIAGNOSIS — I4891 Unspecified atrial fibrillation: Secondary | ICD-10-CM

## 2011-08-08 LAB — POCT INR: INR: 2.3

## 2011-08-23 ENCOUNTER — Other Ambulatory Visit: Payer: Self-pay | Admitting: Internal Medicine

## 2011-09-19 ENCOUNTER — Ambulatory Visit (INDEPENDENT_AMBULATORY_CARE_PROVIDER_SITE_OTHER): Payer: Managed Care, Other (non HMO) | Admitting: *Deleted

## 2011-09-19 DIAGNOSIS — I4891 Unspecified atrial fibrillation: Secondary | ICD-10-CM

## 2011-09-19 DIAGNOSIS — Z7901 Long term (current) use of anticoagulants: Secondary | ICD-10-CM

## 2011-09-24 ENCOUNTER — Other Ambulatory Visit: Payer: Self-pay | Admitting: Family Medicine

## 2011-09-28 ENCOUNTER — Encounter: Payer: Self-pay | Admitting: Family Medicine

## 2011-09-28 ENCOUNTER — Ambulatory Visit (INDEPENDENT_AMBULATORY_CARE_PROVIDER_SITE_OTHER): Payer: Managed Care, Other (non HMO) | Admitting: Family Medicine

## 2011-09-28 VITALS — BP 126/80 | HR 66 | Temp 98.6°F | Wt 282.0 lb

## 2011-09-28 DIAGNOSIS — E785 Hyperlipidemia, unspecified: Secondary | ICD-10-CM

## 2011-09-28 DIAGNOSIS — IMO0001 Reserved for inherently not codable concepts without codable children: Secondary | ICD-10-CM

## 2011-09-28 DIAGNOSIS — I10 Essential (primary) hypertension: Secondary | ICD-10-CM

## 2011-09-28 DIAGNOSIS — M791 Myalgia, unspecified site: Secondary | ICD-10-CM

## 2011-09-28 DIAGNOSIS — I4891 Unspecified atrial fibrillation: Secondary | ICD-10-CM

## 2011-09-28 NOTE — Progress Notes (Signed)
  Subjective:    Patient ID: [name], male    DOB: 02/21/1951, 61 y.o.   MRN: [id number]  HPI Here to ask about possible side effects of meds. For the past few months he gets a little stiff in the legs when he has been sitting for several hours or when lying in bed. As he starts to get up and walk he feels stiffness in the legs. Then after a few minutes this works itself out and he feels fine again. He denies any aching or pains. No weakness. He is concerned this may be an effect of his statin. His BP is stable.    Review of Systems  Constitutional: Negative.   Respiratory: Negative.   Cardiovascular: Negative.   Musculoskeletal: Positive for myalgias.       Objective:   Physical Exam  Constitutional: He appears well-developed and well-nourished. No distress.  Neck: No thyromegaly present.  Cardiovascular: Normal rate, normal heart sounds and intact distal pulses.        Irregular rhythm  Musculoskeletal: Normal range of motion. He exhibits no edema and no tenderness.  Lymphadenopathy:    He has no cervical adenopathy.          Assessment & Plan:  I do not think his muscle stiffness is related to his Lipitor at all, but we will check a CK level today. His HTN and atrial fib sre stable.

## 2011-09-29 NOTE — Progress Notes (Signed)
Quick Note:  I left voice message with normal results. ______ 

## 2011-10-13 ENCOUNTER — Other Ambulatory Visit: Payer: Self-pay | Admitting: Internal Medicine

## 2011-10-20 ENCOUNTER — Other Ambulatory Visit: Payer: Self-pay | Admitting: Family Medicine

## 2011-10-28 ENCOUNTER — Ambulatory Visit (INDEPENDENT_AMBULATORY_CARE_PROVIDER_SITE_OTHER): Payer: Managed Care, Other (non HMO) | Admitting: *Deleted

## 2011-10-28 DIAGNOSIS — I4891 Unspecified atrial fibrillation: Secondary | ICD-10-CM

## 2011-10-28 DIAGNOSIS — Z7901 Long term (current) use of anticoagulants: Secondary | ICD-10-CM

## 2011-11-14 ENCOUNTER — Other Ambulatory Visit: Payer: Self-pay | Admitting: Family Medicine

## 2011-11-15 ENCOUNTER — Telehealth: Payer: Self-pay | Admitting: Family Medicine

## 2011-11-15 MED ORDER — CLONIDINE HCL 0.1 MG PO TABS: 0.1000 mg | ORAL_TABLET | Freq: Two times a day (BID) | ORAL | Status: DC

## 2011-11-15 NOTE — Telephone Encounter (Signed)
Refill request for Clonidine and I did send e-scribe.

## 2011-12-05 ENCOUNTER — Ambulatory Visit (INDEPENDENT_AMBULATORY_CARE_PROVIDER_SITE_OTHER): Payer: Managed Care, Other (non HMO)

## 2011-12-05 DIAGNOSIS — I4891 Unspecified atrial fibrillation: Secondary | ICD-10-CM

## 2011-12-05 DIAGNOSIS — Z7901 Long term (current) use of anticoagulants: Secondary | ICD-10-CM

## 2011-12-05 LAB — POCT INR: INR: 2.7

## 2011-12-12 ENCOUNTER — Other Ambulatory Visit (INDEPENDENT_AMBULATORY_CARE_PROVIDER_SITE_OTHER): Payer: Managed Care, Other (non HMO)

## 2011-12-12 DIAGNOSIS — Z0000 Encounter for general adult medical examination without abnormal findings: Secondary | ICD-10-CM

## 2011-12-12 LAB — CBC WITH DIFFERENTIAL/PLATELET
Basophils Absolute: 0 10*3/uL (ref 0.0–0.1)
Basophils Relative: 0.3 % (ref 0.0–3.0)
Eosinophils Absolute: 0.1 10*3/uL (ref 0.0–0.7)
MCHC: 33.5 g/dL (ref 30.0–36.0)
MCV: 94.4 fl (ref 78.0–100.0)
Monocytes Absolute: 0.7 10*3/uL (ref 0.1–1.0)
Neutro Abs: 4.2 10*3/uL (ref 1.4–7.7)
Neutrophils Relative %: 60.7 % (ref 43.0–77.0)
RBC: 4.4 Mil/uL (ref 4.22–5.81)
RDW: 13.6 % (ref 11.5–14.6)

## 2011-12-12 LAB — HEPATIC FUNCTION PANEL
Alkaline Phosphatase: 74 U/L (ref 39–117)
Bilirubin, Direct: 0.2 mg/dL (ref 0.0–0.3)

## 2011-12-12 LAB — POCT URINALYSIS DIPSTICK
Bilirubin, UA: NEGATIVE
Glucose, UA: NEGATIVE
Ketones, UA: NEGATIVE
Leukocytes, UA: NEGATIVE
Nitrite, UA: NEGATIVE

## 2011-12-12 LAB — LIPID PANEL
Total CHOL/HDL Ratio: 4
Triglycerides: 97 mg/dL (ref 0.0–149.0)

## 2011-12-12 LAB — BASIC METABOLIC PANEL
BUN: 24 mg/dL — ABNORMAL HIGH (ref 6–23)
CO2: 25 mEq/L (ref 19–32)
Calcium: 10.1 mg/dL (ref 8.4–10.5)
Chloride: 104 mEq/L (ref 96–112)
Creatinine, Ser: 0.8 mg/dL (ref 0.4–1.5)
Glucose, Bld: 98 mg/dL (ref 70–99)

## 2011-12-12 NOTE — Progress Notes (Signed)
Quick Note:  I left voice message with normal results. ______ 

## 2011-12-19 ENCOUNTER — Telehealth: Payer: Self-pay | Admitting: Internal Medicine

## 2011-12-19 ENCOUNTER — Encounter: Payer: Self-pay | Admitting: Family Medicine

## 2011-12-19 ENCOUNTER — Ambulatory Visit (INDEPENDENT_AMBULATORY_CARE_PROVIDER_SITE_OTHER): Payer: Managed Care, Other (non HMO) | Admitting: Family Medicine

## 2011-12-19 VITALS — BP 122/78 | HR 58 | Temp 97.8°F | Ht 69.75 in | Wt 283.0 lb

## 2011-12-19 DIAGNOSIS — Z0000 Encounter for general adult medical examination without abnormal findings: Secondary | ICD-10-CM

## 2011-12-19 DIAGNOSIS — I340 Nonrheumatic mitral (valve) insufficiency: Secondary | ICD-10-CM

## 2011-12-19 MED ORDER — ATORVASTATIN CALCIUM 20 MG PO TABS: 20.0000 mg | ORAL_TABLET | Freq: Every day | ORAL | Status: DC

## 2011-12-19 MED ORDER — AMLODIPINE BESYLATE 10 MG PO TABS: 10.0000 mg | ORAL_TABLET | Freq: Every day | ORAL | Status: DC

## 2011-12-19 MED ORDER — LOSARTAN POTASSIUM 100 MG PO TABS: 100.0000 mg | ORAL_TABLET | Freq: Every day | ORAL | Status: DC

## 2011-12-19 MED ORDER — HYDROCHLOROTHIAZIDE 12.5 MG PO CAPS: 12.5000 mg | ORAL_CAPSULE | Freq: Every day | ORAL | Status: DC

## 2011-12-19 NOTE — Telephone Encounter (Signed)
Patient called wanting to know if he needs echo before he sees Dr.Ross in 12/13.Message sent to Dr.Ross.

## 2011-12-19 NOTE — Telephone Encounter (Signed)
New Problem:    Patient called in wanting to know if he needed to have an ECHO before his next appointment in Dec.  Please call back.

## 2011-12-19 NOTE — Progress Notes (Signed)
  Subjective:    Patient ID: [name], male    DOB: 12-Feb-1951, 61 y.o.   MRN: [id number]  HPI 61 yr old male for a cpx. He feels well except for some stiffness after he sits still for a few hours. No SOB or chest discomfort. His BP is stable.    Review of Systems  Constitutional: Negative.   HENT: Negative.   Eyes: Negative.   Respiratory: Negative.   Cardiovascular: Negative.   Gastrointestinal: Negative.   Genitourinary: Negative.   Musculoskeletal: Negative.   Skin: Negative.   Neurological: Negative.   Hematological: Negative.   Psychiatric/Behavioral: Negative.        Objective:   Physical Exam  Constitutional: He is oriented to person, place, and time. He appears well-developed and well-nourished. No distress.  HENT:  Head: Normocephalic and atraumatic.  Right Ear: External ear normal.  Left Ear: External ear normal.  Nose: Nose normal.  Mouth/Throat: Oropharynx is clear and moist. No oropharyngeal exudate.  Eyes: Conjunctivae normal and EOM are normal. Pupils are equal, round, and reactive to light. Right eye exhibits no discharge. Left eye exhibits no discharge. No scleral icterus.  Neck: Neck supple. No JVD present. No tracheal deviation present. No thyromegaly present.  Cardiovascular: Normal rate, regular rhythm, normal heart sounds and intact distal pulses.  Exam reveals no gallop and no friction rub.   No murmur heard. Pulmonary/Chest: Effort normal and breath sounds normal. No respiratory distress. He has no wheezes. He has no rales. He exhibits no tenderness.  Abdominal: Soft. Bowel sounds are normal. He exhibits no distension and no mass. There is no tenderness. There is no rebound and no guarding.  Genitourinary: Rectum normal, prostate normal and penis normal. Guaiac negative stool. No penile tenderness.  Musculoskeletal: Normal range of motion. He exhibits no edema and no tenderness.  Lymphadenopathy:    He has no cervical adenopathy.  Neurological: He  is alert and oriented to person, place, and time. He has normal reflexes. No cranial nerve deficit. He exhibits normal muscle tone. Coordination normal.  Skin: Skin is warm and dry. No rash noted. He is not diaphoretic. No erythema. No pallor.  Psychiatric: He has a normal mood and affect. His behavior is normal. Judgment and thought content normal.          Assessment & Plan:  Well exam. He needs to see Dr. Tenny Craw soon. We discussed his needs to get more exercise and to lose weight.

## 2011-12-20 NOTE — Telephone Encounter (Signed)
Yes, he should have echo before I see him.  Sched Nov/Dec

## 2011-12-20 NOTE — Telephone Encounter (Signed)
**Note De-identified Sol Odor Obfuscation** LMTCB

## 2011-12-21 NOTE — Telephone Encounter (Signed)
PT AWARE  NEEDS  AN ECHO  .[name]

## 2012-01-16 ENCOUNTER — Ambulatory Visit (INDEPENDENT_AMBULATORY_CARE_PROVIDER_SITE_OTHER): Payer: Managed Care, Other (non HMO) | Admitting: *Deleted

## 2012-01-16 DIAGNOSIS — I4891 Unspecified atrial fibrillation: Secondary | ICD-10-CM

## 2012-01-16 DIAGNOSIS — Z7901 Long term (current) use of anticoagulants: Secondary | ICD-10-CM

## 2012-01-23 ENCOUNTER — Ambulatory Visit (HOSPITAL_COMMUNITY): Payer: Managed Care, Other (non HMO) | Attending: Cardiology | Admitting: Radiology

## 2012-01-23 DIAGNOSIS — I359 Nonrheumatic aortic valve disorder, unspecified: Secondary | ICD-10-CM | POA: Insufficient documentation

## 2012-01-23 DIAGNOSIS — I4891 Unspecified atrial fibrillation: Secondary | ICD-10-CM | POA: Insufficient documentation

## 2012-01-23 DIAGNOSIS — E785 Hyperlipidemia, unspecified: Secondary | ICD-10-CM | POA: Insufficient documentation

## 2012-01-23 DIAGNOSIS — I517 Cardiomegaly: Secondary | ICD-10-CM | POA: Insufficient documentation

## 2012-01-23 DIAGNOSIS — I340 Nonrheumatic mitral (valve) insufficiency: Secondary | ICD-10-CM

## 2012-01-23 NOTE — Progress Notes (Signed)
Echocardiogram performed.  

## 2012-02-06 ENCOUNTER — Encounter: Payer: Self-pay | Admitting: Internal Medicine

## 2012-02-06 ENCOUNTER — Ambulatory Visit (INDEPENDENT_AMBULATORY_CARE_PROVIDER_SITE_OTHER): Payer: Managed Care, Other (non HMO) | Admitting: Internal Medicine

## 2012-02-06 VITALS — BP 144/90 | HR 57 | Ht 71.0 in | Wt 285.0 lb

## 2012-02-06 DIAGNOSIS — I340 Nonrheumatic mitral (valve) insufficiency: Secondary | ICD-10-CM

## 2012-02-06 DIAGNOSIS — I059 Rheumatic mitral valve disease, unspecified: Secondary | ICD-10-CM

## 2012-02-06 DIAGNOSIS — I10 Essential (primary) hypertension: Secondary | ICD-10-CM

## 2012-02-06 DIAGNOSIS — G4730 Sleep apnea, unspecified: Secondary | ICD-10-CM

## 2012-02-06 DIAGNOSIS — I4891 Unspecified atrial fibrillation: Secondary | ICD-10-CM

## 2012-02-06 NOTE — Patient Instructions (Addendum)
Your physician wants you to follow-up in:  12 months.  You will receive a reminder letter in the mail two months in advance. If you don't receive a letter, please call our office to schedule the follow-up appointment.   

## 2012-02-06 NOTE — Progress Notes (Signed)
HPI Patient is a 61 yo with a history of mitral regurgitation, HTN, afib, sleep apnea.  I saw him in November 2012. Since seen he is doing OK  Denies SOB  No palpitations.  No CP   While at work is very active moving around Using CPAP  Allergies  Allergen Reactions  . Penicillins     REACTION: unspecified    Current Outpatient Prescriptions  Medication Sig Dispense Refill  . amLODipine (NORVASC) 10 MG tablet Take 1 tablet (10 mg total) by mouth daily.  30 tablet  11  . aspirin 81 MG tablet Take 81 mg by mouth daily.        Marland Kitchen atorvastatin (LIPITOR) 20 MG tablet Take 1 tablet (20 mg total) by mouth daily.  30 tablet  11  . carvedilol (COREG) 25 MG tablet TAKE 1 TABLET (25 MG TOTAL) BY MOUTH 2 (TWO) TIMES DAILY.  60 tablet  5  . cloNIDine (CATAPRES) 0.1 MG tablet Take 1 tablet (0.1 mg total) by mouth 2 (two) times daily.  60 tablet  11  . docusate sodium (COLACE) 100 MG capsule Take 100 mg by mouth 3 (three) times daily as needed.       . fish oil-omega-3 fatty acids 1000 MG capsule Take 1 g by mouth daily.        . hydrochlorothiazide (MICROZIDE) 12.5 MG capsule Take 1 capsule (12.5 mg total) by mouth daily.  30 capsule  11  . losartan (COZAAR) 100 MG tablet Take 1 tablet (100 mg total) by mouth daily.  30 tablet  11  . warfarin (COUMADIN) 5 MG tablet Except on Thursday take 7.5 mg      . halobetasol (ULTRAVATE) 0.05 % cream         Past Medical History  Diagnosis Date  . Obesity   . Hypertension   . Heart valve problem     Dr [name]  . Atrial fibrillation     sees Dr. [name]   . Other and unspecified hyperlipidemia   . OSA (obstructive sleep apnea)   . Other and unspecified hyperlipidemia   . Mitral valve disorders     Past Surgical History  Procedure Date  . Colonoscopy 03-16-10    per Dr. Russella Dar, benign polyp, repeat in 5 yrs     Family History  Problem Relation Age of Onset  . Stroke Father   . Coronary artery disease    . Hypertension      History    Social History  . Marital Status: Single    Spouse Name: N/A    Number of Children: N/A  . Years of Education: N/A   Occupational History  . Not on file.   Social History Main Topics  . Smoking status: Never Smoker   . Smokeless tobacco: Never Used  . Alcohol Use: Yes     Comment: once or twice a year  . Drug Use: No  . Sexually Active: Not on file   Other Topics Concern  . Not on file   Social History Narrative  . No narrative on file    Review of Systems:  All systems reviewed.  They are negative to the above problem except as previously stated.  Vital Signs: BP 144/90  Pulse 57  Ht 5\' 11"  (1.803 m)  Wt 285 lb (129.275 kg)  BMI 39.75 kg/m2  SpO2 99%  Physical Exam Patient is in NAD HEENT:  Normocephalic, atraumatic. EOMI, PERRLA.  Neck: JVP is normal.  No  bruits.  Lungs: clear to auscultation. No rales no wheezes.  Heart: Regular rate and rhythm. Normal S1, S2. No S3.   II/VI sysolic murmur LSB, apex PMI not displaced.  Abdomen:  Supple, nontender. Normal bowel sounds. No masses. No hepatomegaly.  Extremities:   Good distal pulses throughout. No lower extremity edema.  Musculoskeletal :moving all extremities.  Neuro:   alert and oriented x3.  CN II-XII grossly intact.  EKG  Afib.  57 bpm.  LAD Assessment and Plan:  1.  Mitral regurgitation.  Patient's recent echo shows no signif change from previous.  MR is eccentric making it difficult to grade  I am not convinced severe.  Would continue to follow  2.  Atrial fibrillation.  Failed cardioversion.  Keep on rate control and coumadin  3.  HTN  Follow  4.  Sleep apnea.  Continues to use CPAP.

## 2012-02-20 ENCOUNTER — Ambulatory Visit (INDEPENDENT_AMBULATORY_CARE_PROVIDER_SITE_OTHER): Payer: Managed Care, Other (non HMO) | Admitting: Pharmacist

## 2012-02-20 DIAGNOSIS — I4891 Unspecified atrial fibrillation: Secondary | ICD-10-CM

## 2012-02-20 DIAGNOSIS — Z7901 Long term (current) use of anticoagulants: Secondary | ICD-10-CM

## 2012-02-20 LAB — POCT INR: INR: 2.4

## 2012-03-19 ENCOUNTER — Other Ambulatory Visit: Payer: Self-pay | Admitting: Internal Medicine

## 2012-03-20 ENCOUNTER — Other Ambulatory Visit: Payer: Self-pay

## 2012-03-20 MED ORDER — WARFARIN SODIUM 5 MG PO TABS: ORAL_TABLET | ORAL | Status: DC

## 2012-04-02 ENCOUNTER — Ambulatory Visit (INDEPENDENT_AMBULATORY_CARE_PROVIDER_SITE_OTHER): Payer: Managed Care, Other (non HMO) | Admitting: Pharmacist

## 2012-04-02 DIAGNOSIS — I4891 Unspecified atrial fibrillation: Secondary | ICD-10-CM

## 2012-04-02 DIAGNOSIS — Z7901 Long term (current) use of anticoagulants: Secondary | ICD-10-CM

## 2012-04-02 LAB — POCT INR: INR: 2.4

## 2012-05-14 ENCOUNTER — Ambulatory Visit (INDEPENDENT_AMBULATORY_CARE_PROVIDER_SITE_OTHER): Payer: Managed Care, Other (non HMO)

## 2012-05-14 DIAGNOSIS — I4891 Unspecified atrial fibrillation: Secondary | ICD-10-CM

## 2012-05-14 DIAGNOSIS — Z7901 Long term (current) use of anticoagulants: Secondary | ICD-10-CM

## 2012-05-14 LAB — POCT INR: INR: 2.7

## 2012-06-25 ENCOUNTER — Ambulatory Visit (INDEPENDENT_AMBULATORY_CARE_PROVIDER_SITE_OTHER): Payer: Managed Care, Other (non HMO) | Admitting: Pharmacist

## 2012-06-25 ENCOUNTER — Ambulatory Visit (INDEPENDENT_AMBULATORY_CARE_PROVIDER_SITE_OTHER): Payer: Managed Care, Other (non HMO) | Admitting: Pulmonary Disease

## 2012-06-25 ENCOUNTER — Encounter: Payer: Self-pay | Admitting: Pulmonary Disease

## 2012-06-25 ENCOUNTER — Encounter: Payer: Self-pay | Admitting: Pharmacist

## 2012-06-25 VITALS — BP 110/62 | HR 70 | Temp 97.2°F | Ht 71.0 in | Wt 287.4 lb

## 2012-06-25 DIAGNOSIS — G4733 Obstructive sleep apnea (adult) (pediatric): Secondary | ICD-10-CM

## 2012-06-25 DIAGNOSIS — I4891 Unspecified atrial fibrillation: Secondary | ICD-10-CM

## 2012-06-25 DIAGNOSIS — Z7901 Long term (current) use of anticoagulants: Secondary | ICD-10-CM

## 2012-06-25 NOTE — Assessment & Plan Note (Signed)
The patient is compliant with wearing his CPAP nightly, but needs to work on wearing the device for longer periods of time during the night.  I have asked him again to try using his ramp button when he comes back from the bathroom.  He also has not been keeping up with his cushion changes on his mask.  Finally, I have asked him to work aggressively on weight loss.

## 2012-06-25 NOTE — Progress Notes (Signed)
  Subjective:    Patient ID: [name], male    DOB: [birth year], 62 y.o.   MRN: [id number]  HPI The patient comes in today for followup of his known obstructive sleep apnea.  He is wearing CPAP compliantly, but is still only getting 4 or 5 hours a night with the device.  He comes back from the bathroom, and will often not keep it on for the remainder of the night.  I've asked him to use the ramp button on his device, but he has not tried this.  He has also not kept up with cushion changes on his mask.  Of note, the patient's weight is up 6 pounds since last visit.   Review of Systems  Constitutional: Negative for fever and unexpected weight change.  HENT: Negative for ear pain, nosebleeds, congestion, sore throat, rhinorrhea, sneezing, trouble swallowing, dental problem, postnasal drip and sinus pressure.   Eyes: Negative for redness and itching.  Respiratory: Negative for cough, chest tightness, shortness of breath and wheezing.   Cardiovascular: Negative for palpitations and leg swelling.  Gastrointestinal: Negative for nausea and vomiting.  Genitourinary: Negative for dysuria.  Musculoskeletal: Negative for joint swelling.  Skin: Negative for rash.  Neurological: Negative for headaches.  Hematological: Does not bruise/bleed easily.  Psychiatric/Behavioral: Negative for dysphoric mood. The patient is not nervous/anxious.        Objective:   Physical Exam Obese male in no acute distress Nose without purulence or discharge noted No skin breakdown or pressure necrosis from CPAP mask Neck without lymphadenopathy Lower extremities with no edema, no cyanosis Alert and awake, does not appear to be overly sleepy.  Moves all 4 extremities.       Assessment & Plan:

## 2012-06-25 NOTE — Patient Instructions (Addendum)
Continue with cpap, but keep up with cushion changes on your mask every 2mos. Try using the ramp button when you come back from using the bathroom to see if you can keep your cpap mask on longer Work on weight loss. followup with me in one year.

## 2012-06-29 ENCOUNTER — Other Ambulatory Visit: Payer: Self-pay | Admitting: Internal Medicine

## 2012-07-02 ENCOUNTER — Encounter: Payer: Self-pay | Admitting: Family Medicine

## 2012-07-02 ENCOUNTER — Ambulatory Visit (INDEPENDENT_AMBULATORY_CARE_PROVIDER_SITE_OTHER): Payer: Managed Care, Other (non HMO) | Admitting: Family Medicine

## 2012-07-02 VITALS — BP 124/80 | HR 72 | Temp 98.2°F | Wt 284.0 lb

## 2012-07-02 DIAGNOSIS — M79604 Pain in right leg: Secondary | ICD-10-CM

## 2012-07-02 DIAGNOSIS — M79609 Pain in unspecified limb: Secondary | ICD-10-CM

## 2012-07-02 NOTE — Progress Notes (Signed)
  Subjective:    Patient ID: [name], male    DOB: 01/06/1951, 62 y.o.   MRN: [id number]  HPI Here to discuss worsening pain in his thighs which has been more of a problem the past few months. This started around a year ago, and he relates this to the time that his Zocor was changed to Lipitor. He knows that statins can cause muscle pains. The pains are centered in the backs of both thighs. They used to hurt when he stood up from sitting and then feel better as he walked it off, but now the pain is constant. He has no numbness and no weakness of the legs. No back pain at all. He takes Aleve at times. His legs and feet do not swell.    Review of Systems  Constitutional: Negative.   Respiratory: Negative.   Cardiovascular: Negative.   Musculoskeletal: Positive for myalgias. Negative for back pain, joint swelling and arthralgias.       Objective:   Physical Exam  Constitutional: He appears well-developed and well-nourished.  Musculoskeletal: Normal range of motion. He exhibits no edema and no tenderness.  His spine shows no tenderness and full ROM. Both his hamstrings are quite tight          Assessment & Plan:  His thigh pains seem to be either a side effect of his statin or from tight hamstrings. He will stop taking Lipitor for one month to see if the pains resolve. If not we may try PT to loosen the hamstrings.

## 2012-07-31 ENCOUNTER — Encounter: Payer: Self-pay | Admitting: Family Medicine

## 2012-07-31 ENCOUNTER — Ambulatory Visit (INDEPENDENT_AMBULATORY_CARE_PROVIDER_SITE_OTHER): Payer: Managed Care, Other (non HMO) | Admitting: Family Medicine

## 2012-07-31 VITALS — BP 130/84 | HR 87 | Temp 98.3°F | Wt 283.0 lb

## 2012-07-31 DIAGNOSIS — M545 Low back pain, unspecified: Secondary | ICD-10-CM

## 2012-07-31 NOTE — Progress Notes (Signed)
  Subjective:    Patient ID: [name], male    DOB: [birth year], 62 y.o.   MRN: [id number]  HPI Here to follow up on aching pains in the backs of both thighs. One month ago he stopped taking Lipitor to see if would improve things. This has not helped very much. Also at our last visit he denies any back pains, but recently he has had some pains in the lower back and into both buttocks. No numbness or weakness.    Review of Systems  Constitutional: Negative.   Musculoskeletal: Positive for myalgias and back pain.       Objective:   Physical Exam  Constitutional: He appears well-developed and well-nourished.  Musculoskeletal: Normal range of motion. He exhibits no edema and no tenderness.          Assessment & Plan:  He now seems to have sciatic pains, and I think his tight hamstrings are a big part of the problem. We will refer him to PT.

## 2012-08-06 ENCOUNTER — Ambulatory Visit: Payer: Managed Care, Other (non HMO) | Attending: Family Medicine

## 2012-08-06 ENCOUNTER — Ambulatory Visit (INDEPENDENT_AMBULATORY_CARE_PROVIDER_SITE_OTHER): Payer: Managed Care, Other (non HMO) | Admitting: *Deleted

## 2012-08-06 DIAGNOSIS — Z7901 Long term (current) use of anticoagulants: Secondary | ICD-10-CM

## 2012-08-06 DIAGNOSIS — M545 Low back pain, unspecified: Secondary | ICD-10-CM | POA: Insufficient documentation

## 2012-08-06 DIAGNOSIS — I4891 Unspecified atrial fibrillation: Secondary | ICD-10-CM

## 2012-08-06 DIAGNOSIS — M25659 Stiffness of unspecified hip, not elsewhere classified: Secondary | ICD-10-CM | POA: Insufficient documentation

## 2012-08-06 DIAGNOSIS — IMO0001 Reserved for inherently not codable concepts without codable children: Secondary | ICD-10-CM | POA: Insufficient documentation

## 2012-08-06 DIAGNOSIS — R5381 Other malaise: Secondary | ICD-10-CM | POA: Insufficient documentation

## 2012-08-08 ENCOUNTER — Ambulatory Visit: Payer: Managed Care, Other (non HMO) | Admitting: Physical Therapy

## 2012-08-13 ENCOUNTER — Ambulatory Visit: Payer: Managed Care, Other (non HMO) | Admitting: Physical Therapy

## 2012-08-15 ENCOUNTER — Ambulatory Visit: Payer: Managed Care, Other (non HMO) | Admitting: Physical Therapy

## 2012-08-20 ENCOUNTER — Ambulatory Visit: Payer: Managed Care, Other (non HMO) | Admitting: Physical Therapy

## 2012-08-22 ENCOUNTER — Encounter: Payer: Self-pay | Admitting: Family Medicine

## 2012-08-22 ENCOUNTER — Ambulatory Visit (INDEPENDENT_AMBULATORY_CARE_PROVIDER_SITE_OTHER): Payer: Managed Care, Other (non HMO) | Admitting: Family Medicine

## 2012-08-22 ENCOUNTER — Ambulatory Visit: Payer: Managed Care, Other (non HMO) | Admitting: Physical Therapy

## 2012-08-22 VITALS — BP 132/80 | HR 72 | Temp 98.2°F | Wt 285.0 lb

## 2012-08-22 DIAGNOSIS — M25569 Pain in unspecified knee: Secondary | ICD-10-CM

## 2012-08-22 DIAGNOSIS — M545 Low back pain, unspecified: Secondary | ICD-10-CM

## 2012-08-22 DIAGNOSIS — M79604 Pain in right leg: Secondary | ICD-10-CM

## 2012-08-22 MED ORDER — METAXALONE 800 MG PO TABS: 800.0000 mg | ORAL_TABLET | Freq: Four times a day (QID) | ORAL | Status: DC | PRN

## 2012-08-22 MED ORDER — SIMVASTATIN 40 MG PO TABS: 40.0000 mg | ORAL_TABLET | Freq: Every day | ORAL | Status: DC

## 2012-08-22 NOTE — Progress Notes (Signed)
  Subjective:    Patient ID: [name], male    DOB: 03/16/1950, 62 y.o.   MRN: [id number]  HPI Here to follow on lower back pain and pain in both legs. He has been going to PT and this has helped his left leg quite a bit. However he still has severe pains in the right buttock and right leg.    Review of Systems  Musculoskeletal: Positive for myalgias, back pain and gait problem.       Objective:   Physical Exam  Constitutional:  In pain, limping  Musculoskeletal:  Tender over the right sciatic notch, positive SLR on the right           Assessment & Plan:  He may have pinched nerves in the back so we will set up a lumbar MRI soon. Try Metaxalone 800 mg four times a day for spasms. Continue with PT

## 2012-08-27 ENCOUNTER — Encounter: Payer: Self-pay | Admitting: Family Medicine

## 2012-08-27 ENCOUNTER — Ambulatory Visit: Payer: Managed Care, Other (non HMO) | Admitting: Physical Therapy

## 2012-08-27 ENCOUNTER — Ambulatory Visit (INDEPENDENT_AMBULATORY_CARE_PROVIDER_SITE_OTHER): Payer: Managed Care, Other (non HMO) | Admitting: Family Medicine

## 2012-08-27 VITALS — BP 132/82 | HR 74 | Temp 98.6°F

## 2012-08-27 DIAGNOSIS — T2125XA Burn of second degree of buttock, initial encounter: Secondary | ICD-10-CM

## 2012-08-27 DIAGNOSIS — T2124XA Burn of second degree of lower back, initial encounter: Secondary | ICD-10-CM

## 2012-08-27 DIAGNOSIS — M5430 Sciatica, unspecified side: Secondary | ICD-10-CM
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
## 2012-08-28 ENCOUNTER — Ambulatory Visit
Admission: RE | Admit: 2012-08-28 | Discharge: 2012-08-28 | Disposition: A | Discharge status: Home / Self Care | Payer: Managed Care, Other (non HMO) | Source: Ambulatory Visit | Attending: Family Medicine | Admitting: Family Medicine

## 2012-08-28 DIAGNOSIS — M545 Low back pain: Secondary | ICD-10-CM

## 2012-08-28 DIAGNOSIS — M79604 Pain in right leg: Secondary | ICD-10-CM

## 2012-08-30 ENCOUNTER — Ambulatory Visit: Payer: Managed Care, Other (non HMO)

## 2012-08-30 NOTE — Addendum Note (Signed)
Addended by: Gershon Crane A on: 08/30/2012 12:28 PM   Modules accepted: Orders

## 2012-08-30 NOTE — Progress Notes (Signed)
Quick Note:  I left voice message with results. ______ 

## 2012-09-03 ENCOUNTER — Ambulatory Visit: Payer: Managed Care, Other (non HMO)

## 2012-09-03 ENCOUNTER — Other Ambulatory Visit: Payer: Managed Care, Other (non HMO)

## 2012-09-10 ENCOUNTER — Ambulatory Visit: Payer: Managed Care, Other (non HMO) | Attending: Family Medicine | Admitting: Physical Therapy

## 2012-09-10 DIAGNOSIS — R5381 Other malaise: Secondary | ICD-10-CM | POA: Insufficient documentation

## 2012-09-10 DIAGNOSIS — IMO0001 Reserved for inherently not codable concepts without codable children: Secondary | ICD-10-CM | POA: Insufficient documentation

## 2012-09-10 DIAGNOSIS — M25659 Stiffness of unspecified hip, not elsewhere classified: Secondary | ICD-10-CM | POA: Insufficient documentation

## 2012-09-10 DIAGNOSIS — M545 Low back pain, unspecified: Secondary | ICD-10-CM | POA: Insufficient documentation

## 2012-09-12 ENCOUNTER — Other Ambulatory Visit: Payer: Self-pay | Admitting: Internal Medicine

## 2012-09-17 ENCOUNTER — Ambulatory Visit (INDEPENDENT_AMBULATORY_CARE_PROVIDER_SITE_OTHER): Payer: Managed Care, Other (non HMO)

## 2012-09-17 DIAGNOSIS — Z7901 Long term (current) use of anticoagulants: Secondary | ICD-10-CM

## 2012-09-17 DIAGNOSIS — I4891 Unspecified atrial fibrillation: Secondary | ICD-10-CM

## 2012-10-01 ENCOUNTER — Ambulatory Visit (INDEPENDENT_AMBULATORY_CARE_PROVIDER_SITE_OTHER): Payer: Managed Care, Other (non HMO) | Admitting: *Deleted

## 2012-10-01 DIAGNOSIS — Z7901 Long term (current) use of anticoagulants: Secondary | ICD-10-CM

## 2012-10-01 DIAGNOSIS — I4891 Unspecified atrial fibrillation: Secondary | ICD-10-CM

## 2012-10-01 LAB — POCT INR: INR: 3.4

## 2012-10-10 ENCOUNTER — Other Ambulatory Visit: Payer: Self-pay | Admitting: Neurosurgery

## 2012-10-16 ENCOUNTER — Telehealth: Payer: Self-pay | Admitting: Internal Medicine

## 2012-10-16 NOTE — Telephone Encounter (Signed)
New Prob  Pt wants to speak with someone regarding an upcoming procedure he is about to have. He wants to make sure that it will be ok.

## 2012-10-17 ENCOUNTER — Telehealth: Payer: Self-pay | Admitting: *Deleted

## 2012-10-17 NOTE — Telephone Encounter (Signed)
Message copied by Carmela Hurt on Wed Oct 17, 2012 12:20 PM ------      Message from: [name] V      Created: Wed Oct 17, 2012 10:27 AM      Regarding: RE: cardiac clearance       No, patient does not need bridging  No history of CVA      ----- Message -----         From: Carmela Hurt, RN         Sent: 10/17/2012  10:16 AM           To: Karn Cassis, MD, Pricilla Riffle, MD, #      Subject: cardiac clearance                                        Patient having LUMBAR LAMINECTOMY/DECOMPRESSION MICRODISCECTOMY 2 LEVELS  On 9/17, will he need Lovenox bridging?            Thanks coumadin clinic       ------

## 2012-10-17 NOTE — Telephone Encounter (Signed)
Problem already addressed in another phone note. See phone note from 8/13 for details.

## 2012-10-17 NOTE — Telephone Encounter (Signed)
Patient called regarding directions from neurosurgery office to hold coumadin almost 3 weeks prior to procedure, I called Dr Eino Farber office and spoke with Shanda Bumps she said his original day of surgery was 9/3 which changed to 9/17, they want him off his coumadin and ASA 7 days prior to procedure. He will take his last coumadin dose and ASPIRIN on 11/13/2012.    I have given patient these instructions and will also route note to Dr Tenny Craw for clearance.

## 2012-10-22 ENCOUNTER — Ambulatory Visit (INDEPENDENT_AMBULATORY_CARE_PROVIDER_SITE_OTHER): Payer: Managed Care, Other (non HMO)

## 2012-10-22 DIAGNOSIS — I4891 Unspecified atrial fibrillation: Secondary | ICD-10-CM

## 2012-10-22 DIAGNOSIS — Z7901 Long term (current) use of anticoagulants: Secondary | ICD-10-CM

## 2012-10-24 ENCOUNTER — Telehealth: Payer: Self-pay | Admitting: Internal Medicine

## 2012-10-24 NOTE — Telephone Encounter (Signed)
Advised no pre med needed from cardiac standpoint

## 2012-10-24 NOTE — Telephone Encounter (Signed)
Pt wants to know if needs to be medicated before dental cleaning and poss filling

## 2012-11-07 ENCOUNTER — Other Ambulatory Visit: Payer: Self-pay | Admitting: Family Medicine

## 2012-11-07 NOTE — Telephone Encounter (Signed)
Can we refill this? 

## 2012-11-08 ENCOUNTER — Telehealth: Payer: Self-pay | Admitting: Family Medicine

## 2012-11-08 MED ORDER — CLONIDINE HCL 0.1 MG PO TABS: 0.1000 mg | ORAL_TABLET | Freq: Two times a day (BID) | ORAL | Status: DC

## 2012-11-08 NOTE — Telephone Encounter (Signed)
Refill request for Clonidine 0.1 mg take 1 po bid. Can we refill this?

## 2012-11-08 NOTE — Telephone Encounter (Signed)
I sent script e-scribe. 

## 2012-11-08 NOTE — Telephone Encounter (Signed)
Refill for one year 

## 2012-11-12 ENCOUNTER — Encounter (HOSPITAL_COMMUNITY): Payer: Self-pay | Admitting: Respiratory Therapy

## 2012-11-13 ENCOUNTER — Encounter (HOSPITAL_COMMUNITY): Payer: Self-pay

## 2012-11-13 ENCOUNTER — Ambulatory Visit (HOSPITAL_COMMUNITY)
Admission: RE | Admit: 2012-11-13 | Discharge: 2012-11-13 | Disposition: A | Discharge status: Home / Self Care | Payer: Managed Care, Other (non HMO) | Source: Ambulatory Visit | Attending: Neurosurgery | Admitting: Neurosurgery

## 2012-11-13 ENCOUNTER — Encounter (HOSPITAL_COMMUNITY)
Admission: RE | Admit: 2012-11-13 | Discharge: 2012-11-13 | Disposition: A | Discharge status: Home / Self Care | Payer: Managed Care, Other (non HMO) | Source: Ambulatory Visit | Attending: Neurosurgery | Admitting: Neurosurgery

## 2012-11-13 DIAGNOSIS — Z01812 Encounter for preprocedural laboratory examination: Secondary | ICD-10-CM | POA: Insufficient documentation

## 2012-11-13 DIAGNOSIS — Z01818 Encounter for other preprocedural examination: Secondary | ICD-10-CM | POA: Insufficient documentation

## 2012-11-13 LAB — BASIC METABOLIC PANEL
BUN: 19 mg/dL (ref 6–23)
Chloride: 105 mEq/L (ref 96–112)
GFR calc Af Amer: >90 mL/min (ref >90)
Potassium: 4.1 mEq/L (ref 3.5–5.1)

## 2012-11-13 LAB — CBC
HCT: 42.7 % (ref 39.0–52.0)
Platelets: 186 10*3/uL (ref 150–400)
RDW: 13.4 % (ref 11.5–15.5)
WBC: 8.1 10*3/uL (ref 4.0–10.5)

## 2012-11-13 LAB — SURGICAL PCR SCREEN: Staphylococcus aureus: POSITIVE — AB

## 2012-11-13 LAB — PROTIME-INR: Prothrombin Time: 23.2 seconds — ABNORMAL HIGH (ref 11.6–15.2)

## 2012-11-13 LAB — APTT: aPTT: 35 seconds (ref 24–37)

## 2012-11-13 NOTE — Pre-Procedure Instructions (Signed)
[name]  11/13/2012   Your procedure is scheduled on:  11/21/12  Report to [facility] at 630 AM.  Call this number if you have problems the morning of surgery: [phone]   Remember:   Do not eat food or drink liquids after midnight.   Take these medicines the morning of surgery with A SIP OF WATER: norvasc,carvedilol,clondine   Do not wear jewelry, make-up or nail polish.  Do not wear lotions, powders, or perfumes. You may wear deodorant.  Do not shave 48 hours prior to surgery. Men may shave face and neck.  Do not bring valuables to the hospital.  [facility] is not responsible                   for any belongings or valuables.  Contacts, dentures or bridgework may not be worn into surgery.  Leave suitcase in the car. After surgery it may be brought to your room.  For patients admitted to the hospital, checkout time is 11:00 AM the day of  discharge.   Patients discharged the day of surgery will not be allowed to drive  home.  Name and phone number of your driver: family  Special Instructions: Shower using CHG 2 nights before surgery and the night before surgery.  If you shower the day of surgery use CHG.  Use special wash - you have one bottle of CHG for all showers.  You should use approximately 1/3 of the bottle for each shower.   Please read over the following fact sheets that you were given: Pain Booklet, Coughing and Deep Breathing, MRSA Information and Surgical Site Infection Prevention

## 2012-11-14 NOTE — Progress Notes (Signed)
Anesthesia Chart Review:  Patient is a 62 year old male scheduled for L3-5 laminectomy on 11/21/12 by Dr. Jeral Fruit.  History includes morbid obesity, OSA with CPAP use (Dr. Marcelyn Bruins), non-smoker, HTN, HLD, afib with failed cardioversion, history of very mild AS with trivial AR and physiologic MR by 01/2012 echo.  Cardiologist is Dr. [name].  She was notified of plans for surgery and did not feel that he could require Lovenox bridging while off Coumadin.  His last dose of ASA/Coumadin was 11/13/12. PCP is Dr. Gershon Crane.  EKG on 02/06/12 showed afib @ 57 bpm, LAD, non-specific intraventricular block, poor r wave progression.  Echo on 01/23/12 showed: - Left ventricle: The cavity size was normal. Wall thickness was increased in a pattern of mild LVH. The estimated ejection fraction was 60%. Wall motion was normal; there were no regional wall motion abnormalities. The study is not technically sufficient to allow evaluation of LV diastolic function. - Aortic valve: Aortic valve sclerosis with very mild stenosis. Trivial regurgitation. Mean gradient: 16mm Hg (S). Peak gradient: 27mm Hg (S). - Aorta: Ascending aorta is mildly dilated. Aortic root dimension: 34mm (ED). Ascending aortic diameter: 40mm (S). - Mitral valve: Mildly calcified annulus. Physiologic regurgitation. - Left atrium: The atrium was mildly dilated. - Right atrium: The atrium was mildly dilated. - Pulmonary arteries: PA peak pressure: 37mm Hg (S). - Pericardium, extracardiac: A small pericardial effusion was identified posterior to the heart.  CXR on 11/13/12 showed: Left midlung atelectasis. No other focal abnormality is seen.   Preoperative labs noted.  Repeat PT/INR on the day of surgery.  If follow-up labs reasonable then I would anticipate that he could proceed as planned.  Velna Ochs [facility]/Anesthesiology Phone [phone] 11/14/2012 12:10 PM

## 2012-11-20 MED ORDER — VANCOMYCIN HCL 10 G IV SOLR
1500.0000 mg | INTRAVENOUS | Status: AC
  Administered 2012-11-21: 1500 mg via INTRAVENOUS
  Filled 2012-11-20: qty 1500

## 2012-11-21 ENCOUNTER — Inpatient Hospital Stay (HOSPITAL_COMMUNITY): Payer: Managed Care, Other (non HMO) | Admitting: Anesthesiology

## 2012-11-21 ENCOUNTER — Other Ambulatory Visit: Payer: Self-pay | Admitting: Internal Medicine

## 2012-11-21 ENCOUNTER — Inpatient Hospital Stay (HOSPITAL_COMMUNITY): Payer: Managed Care, Other (non HMO)

## 2012-11-21 ENCOUNTER — Encounter (HOSPITAL_COMMUNITY): Payer: Self-pay | Admitting: Anesthesiology

## 2012-11-21 ENCOUNTER — Encounter (HOSPITAL_COMMUNITY)
Admission: RE | Disposition: A | Discharge status: Home / Self Care | Payer: Self-pay | Source: Ambulatory Visit | Attending: Neurosurgery

## 2012-11-21 ENCOUNTER — Inpatient Hospital Stay (HOSPITAL_COMMUNITY)
Admission: RE | Admit: 2012-11-21 | Discharge: 2012-11-23 | DRG: 490 | Disposition: A | Discharge status: Home / Self Care | Payer: Managed Care, Other (non HMO) | Source: Ambulatory Visit | Attending: Neurosurgery | Admitting: Neurosurgery

## 2012-11-21 ENCOUNTER — Encounter (HOSPITAL_COMMUNITY): Payer: Self-pay | Admitting: Vascular Surgery

## 2012-11-21 DIAGNOSIS — I059 Rheumatic mitral valve disease, unspecified: Secondary | ICD-10-CM | POA: Diagnosis present

## 2012-11-21 DIAGNOSIS — E669 Obesity, unspecified: Secondary | ICD-10-CM | POA: Diagnosis present

## 2012-11-21 DIAGNOSIS — Z6841 Body Mass Index (BMI) 40.0 and over, adult: Secondary | ICD-10-CM

## 2012-11-21 DIAGNOSIS — E785 Hyperlipidemia, unspecified: Secondary | ICD-10-CM | POA: Diagnosis present

## 2012-11-21 DIAGNOSIS — I10 Essential (primary) hypertension: Secondary | ICD-10-CM | POA: Diagnosis present

## 2012-11-21 DIAGNOSIS — G4733 Obstructive sleep apnea (adult) (pediatric): Secondary | ICD-10-CM | POA: Diagnosis present

## 2012-11-21 DIAGNOSIS — Z7901 Long term (current) use of anticoagulants: Secondary | ICD-10-CM

## 2012-11-21 DIAGNOSIS — I4891 Unspecified atrial fibrillation: Secondary | ICD-10-CM | POA: Diagnosis present

## 2012-11-21 DIAGNOSIS — M48062 Spinal stenosis, lumbar region with neurogenic claudication: Principal | ICD-10-CM | POA: Diagnosis present

## 2012-11-21 HISTORY — PX: LUMBAR LAMINECTOMY/DECOMPRESSION MICRODISCECTOMY: SHX5026

## 2012-11-21 LAB — PROTIME-INR: Prothrombin Time: 12.5 seconds (ref 11.6–15.2)

## 2012-11-21 SURGERY — LUMBAR LAMINECTOMY/DECOMPRESSION MICRODISCECTOMY 2 LEVELS
Anesthesia: General | Wound class: Clean

## 2012-11-21 MED ORDER — PHENOL 1.4 % MT LIQD: 1.0000 | OROMUCOSAL | Status: DC | PRN

## 2012-11-21 MED ORDER — ATORVASTATIN CALCIUM 20 MG PO TABS
20.0000 mg | ORAL_TABLET | Freq: Every day | ORAL | Status: DC
  Administered 2012-11-21: 20 mg via ORAL
  Filled 2012-11-21 (×3): qty 1

## 2012-11-21 MED ORDER — CLONIDINE HCL 0.1 MG PO TABS
0.1000 mg | ORAL_TABLET | Freq: Two times a day (BID) | ORAL | Status: DC
  Administered 2012-11-21 – 2012-11-23 (×4): 0.1 mg via ORAL
  Filled 2012-11-21 (×5): qty 1

## 2012-11-21 MED ORDER — AMLODIPINE BESYLATE 10 MG PO TABS
10.0000 mg | ORAL_TABLET | Freq: Every day | ORAL | Status: DC
  Administered 2012-11-22 – 2012-11-23 (×2): 10 mg via ORAL
  Filled 2012-11-21 (×2): qty 1

## 2012-11-21 MED ORDER — ONDANSETRON HCL 4 MG/2ML IJ SOLN: 4.0000 mg | Freq: Once | INTRAMUSCULAR | Status: DC | PRN

## 2012-11-21 MED ORDER — VANCOMYCIN HCL 10 G IV SOLR
1250.0000 mg | Freq: Two times a day (BID) | INTRAVENOUS | Status: DC
  Administered 2012-11-21 – 2012-11-23 (×4): 1250 mg via INTRAVENOUS
  Filled 2012-11-21 (×6): qty 1250

## 2012-11-21 MED ORDER — THROMBIN 5000 UNITS EX SOLR
OROMUCOSAL | Status: DC | PRN
  Administered 2012-11-21: 10:00:00 via TOPICAL

## 2012-11-21 MED ORDER — LIDOCAINE HCL (CARDIAC) 20 MG/ML IV SOLN
INTRAVENOUS | Status: DC | PRN
  Administered 2012-11-21: 100 mg via INTRAVENOUS

## 2012-11-21 MED ORDER — CARVEDILOL 25 MG PO TABS
25.0000 mg | ORAL_TABLET | Freq: Two times a day (BID) | ORAL | Status: DC
  Administered 2012-11-21 – 2012-11-23 (×4): 25 mg via ORAL
  Filled 2012-11-21 (×6): qty 1

## 2012-11-21 MED ORDER — NALOXONE HCL 0.4 MG/ML IJ SOLN: 0.4000 mg | INTRAMUSCULAR | Status: DC | PRN

## 2012-11-21 MED ORDER — LACTATED RINGERS IV SOLN
INTRAVENOUS | Status: DC | PRN
  Administered 2012-11-21 (×2): via INTRAVENOUS

## 2012-11-21 MED ORDER — MORPHINE SULFATE (PF) 1 MG/ML IV SOLN: INTRAVENOUS | Status: DC

## 2012-11-21 MED ORDER — PHENYLEPHRINE HCL 10 MG/ML IJ SOLN
INTRAMUSCULAR | Status: DC | PRN
  Administered 2012-11-21: 120 ug via INTRAVENOUS
  Administered 2012-11-21: 40 ug via INTRAVENOUS

## 2012-11-21 MED ORDER — 0.9 % SODIUM CHLORIDE (POUR BTL) OPTIME
TOPICAL | Status: DC | PRN
  Administered 2012-11-21: 1000 mL

## 2012-11-21 MED ORDER — THROMBIN 20000 UNITS EX SOLR
CUTANEOUS | Status: DC | PRN
  Administered 2012-11-21: 10:00:00 via TOPICAL

## 2012-11-21 MED ORDER — DIPHENHYDRAMINE HCL 12.5 MG/5ML PO ELIX
12.5000 mg | ORAL_SOLUTION | Freq: Four times a day (QID) | ORAL | Status: DC | PRN
  Filled 2012-11-21: qty 5

## 2012-11-21 MED ORDER — ROCURONIUM BROMIDE 100 MG/10ML IV SOLN
INTRAVENOUS | Status: DC | PRN
  Administered 2012-11-21: 50 mg via INTRAVENOUS
  Administered 2012-11-21: 30 mg via INTRAVENOUS

## 2012-11-21 MED ORDER — HYDROMORPHONE HCL PF 1 MG/ML IJ SOLN
INTRAMUSCULAR | Status: AC
  Filled 2012-11-21: qty 1

## 2012-11-21 MED ORDER — SIMVASTATIN 40 MG PO TABS: 40.0000 mg | ORAL_TABLET | Freq: Every day | ORAL | Status: DC

## 2012-11-21 MED ORDER — ONDANSETRON HCL 4 MG/2ML IJ SOLN
4.0000 mg | INTRAMUSCULAR | Status: DC | PRN
  Administered 2012-11-21 (×2): 4 mg via INTRAVENOUS
  Filled 2012-11-21: qty 2

## 2012-11-21 MED ORDER — ZOLPIDEM TARTRATE 5 MG PO TABS: 5.0000 mg | ORAL_TABLET | Freq: Every evening | ORAL | Status: DC | PRN

## 2012-11-21 MED ORDER — FENTANYL CITRATE 0.05 MG/ML IJ SOLN
INTRAMUSCULAR | Status: DC | PRN
  Administered 2012-11-21: 50 ug via INTRAVENOUS
  Administered 2012-11-21: 25 ug via INTRAVENOUS
  Administered 2012-11-21: 50 ug via INTRAVENOUS
  Administered 2012-11-21: 25 ug via INTRAVENOUS
  Administered 2012-11-21: 150 ug via INTRAVENOUS

## 2012-11-21 MED ORDER — ONDANSETRON HCL 4 MG/2ML IJ SOLN
4.0000 mg | Freq: Four times a day (QID) | INTRAMUSCULAR | Status: DC | PRN
  Filled 2012-11-21: qty 2

## 2012-11-21 MED ORDER — HYDROCHLOROTHIAZIDE 12.5 MG PO CAPS
12.5000 mg | ORAL_CAPSULE | Freq: Every day | ORAL | Status: DC
  Administered 2012-11-21 – 2012-11-22 (×2): 12.5 mg via ORAL
  Filled 2012-11-21 (×3): qty 1

## 2012-11-21 MED ORDER — LOSARTAN POTASSIUM 50 MG PO TABS
100.0000 mg | ORAL_TABLET | Freq: Every day | ORAL | Status: DC
  Administered 2012-11-21 – 2012-11-23 (×3): 100 mg via ORAL
  Filled 2012-11-21 (×3): qty 2

## 2012-11-21 MED ORDER — SODIUM CHLORIDE 0.9 % IJ SOLN: 3.0000 mL | INTRAMUSCULAR | Status: DC | PRN

## 2012-11-21 MED ORDER — DIAZEPAM 5 MG PO TABS
5.0000 mg | ORAL_TABLET | Freq: Four times a day (QID) | ORAL | Status: DC | PRN
  Administered 2012-11-21 – 2012-11-23 (×3): 5 mg via ORAL
  Filled 2012-11-21 (×3): qty 1

## 2012-11-21 MED ORDER — SODIUM CHLORIDE 0.9 % IJ SOLN
3.0000 mL | Freq: Two times a day (BID) | INTRAMUSCULAR | Status: DC
  Administered 2012-11-21 – 2012-11-23 (×5): 3 mL via INTRAVENOUS

## 2012-11-21 MED ORDER — ARTIFICIAL TEARS OP OINT
TOPICAL_OINTMENT | OPHTHALMIC | Status: DC | PRN
  Administered 2012-11-21: 1 via OPHTHALMIC

## 2012-11-21 MED ORDER — GLYCOPYRROLATE 0.2 MG/ML IJ SOLN
INTRAMUSCULAR | Status: DC | PRN
  Administered 2012-11-21: 0.6 mg via INTRAVENOUS
  Administered 2012-11-21: 0.4 mg via INTRAVENOUS

## 2012-11-21 MED ORDER — NEOSTIGMINE METHYLSULFATE 1 MG/ML IJ SOLN
INTRAMUSCULAR | Status: DC | PRN
  Administered 2012-11-21: 4 mg via INTRAVENOUS

## 2012-11-21 MED ORDER — ACETAMINOPHEN 325 MG PO TABS: 650.0000 mg | ORAL_TABLET | ORAL | Status: DC | PRN

## 2012-11-21 MED ORDER — MORPHINE SULFATE 2 MG/ML IJ SOLN
1.0000 mg | INTRAMUSCULAR | Status: DC | PRN
  Administered 2012-11-21: 2 mg via INTRAVENOUS
  Filled 2012-11-21: qty 1

## 2012-11-21 MED ORDER — MENTHOL 3 MG MT LOZG: 1.0000 | LOZENGE | OROMUCOSAL | Status: DC | PRN

## 2012-11-21 MED ORDER — PROPOFOL 10 MG/ML IV BOLUS
INTRAVENOUS | Status: DC | PRN
  Administered 2012-11-21: 200 mg via INTRAVENOUS

## 2012-11-21 MED ORDER — DIPHENHYDRAMINE HCL 50 MG/ML IJ SOLN: 12.5000 mg | Freq: Four times a day (QID) | INTRAMUSCULAR | Status: DC | PRN

## 2012-11-21 MED ORDER — OXYCODONE-ACETAMINOPHEN 5-325 MG PO TABS
1.0000 | ORAL_TABLET | ORAL | Status: DC | PRN
  Administered 2012-11-21 – 2012-11-23 (×5): 2 via ORAL
  Filled 2012-11-21 (×5): qty 2

## 2012-11-21 MED ORDER — SODIUM CHLORIDE 0.9 % IV SOLN
INTRAVENOUS | Status: DC
  Administered 2012-11-21: 13:00:00 via INTRAVENOUS

## 2012-11-21 MED ORDER — EPHEDRINE SULFATE 50 MG/ML IJ SOLN
INTRAMUSCULAR | Status: DC | PRN
  Administered 2012-11-21: 10 mg via INTRAVENOUS
  Administered 2012-11-21: 5 mg via INTRAVENOUS
  Administered 2012-11-21: 10 mg via INTRAVENOUS

## 2012-11-21 MED ORDER — SODIUM CHLORIDE 0.9 % IJ SOLN: 9.0000 mL | INTRAMUSCULAR | Status: DC | PRN

## 2012-11-21 MED ORDER — ONDANSETRON HCL 4 MG/2ML IJ SOLN
INTRAMUSCULAR | Status: DC | PRN
  Administered 2012-11-21: 4 mg via INTRAVENOUS

## 2012-11-21 MED ORDER — ACETAMINOPHEN 650 MG RE SUPP: 650.0000 mg | RECTAL | Status: DC | PRN

## 2012-11-21 MED ORDER — HYDROMORPHONE HCL PF 1 MG/ML IJ SOLN
0.2500 mg | INTRAMUSCULAR | Status: DC | PRN
  Administered 2012-11-21 (×2): 0.5 mg via INTRAVENOUS

## 2012-11-21 SURGICAL SUPPLY — 68 items
ADH SKN CLS APL DERMABOND .7 (GAUZE/BANDAGES/DRESSINGS) ×1
APL SKNCLS STERI-STRIP NONHPOA (GAUZE/BANDAGES/DRESSINGS) ×1
BENZOIN TINCTURE PRP APPL 2/3 (GAUZE/BANDAGES/DRESSINGS) ×2 IMPLANT
BLADE SURG ROTATE 9660 (MISCELLANEOUS) IMPLANT
BUR ACORN 6.0 (BURR) ×1 IMPLANT
BUR MATCHSTICK NEURO 3.0 LAGG (BURR) ×2 IMPLANT
CANISTER SUCTION 2500CC (MISCELLANEOUS) ×2 IMPLANT
CLOTH BEACON ORANGE TIMEOUT ST (SAFETY) ×2 IMPLANT
CONT SPEC 4OZ CLIKSEAL STRL BL (MISCELLANEOUS) ×2 IMPLANT
DERMABOND ADVANCED (GAUZE/BANDAGES/DRESSINGS) ×1
DERMABOND ADVANCED .7 DNX12 (GAUZE/BANDAGES/DRESSINGS) IMPLANT
DRAPE LAPAROTOMY 100X72X124 (DRAPES) ×2 IMPLANT
DRAPE MICROSCOPE LEICA (MISCELLANEOUS) ×1 IMPLANT
DRAPE POUCH INSTRU U-SHP 10X18 (DRAPES) ×2 IMPLANT
DRSG OPSITE POSTOP 4X10 (GAUZE/BANDAGES/DRESSINGS) ×1 IMPLANT
DRSG PAD ABDOMINAL 8X10 ST (GAUZE/BANDAGES/DRESSINGS) IMPLANT
DURAPREP 26ML APPLICATOR (WOUND CARE) ×2 IMPLANT
ELECT BLADE 4.0 EZ CLEAN MEGAD (MISCELLANEOUS) ×2
ELECT REM PT RETURN 9FT ADLT (ELECTROSURGICAL) ×2
ELECTRODE BLDE 4.0 EZ CLN MEGD (MISCELLANEOUS) IMPLANT
ELECTRODE REM PT RTRN 9FT ADLT (ELECTROSURGICAL) ×1 IMPLANT
EVACUATOR 3/16  PVC DRAIN (DRAIN) ×1
EVACUATOR 3/16 PVC DRAIN (DRAIN) IMPLANT
GAUZE SPONGE 4X4 16PLY XRAY LF (GAUZE/BANDAGES/DRESSINGS) ×1 IMPLANT
GLOVE BIO SURGEON STRL SZ8 (GLOVE) ×1 IMPLANT
GLOVE BIOGEL M 8.0 STRL (GLOVE) ×2 IMPLANT
GLOVE BIOGEL PI IND STRL 8.5 (GLOVE) IMPLANT
GLOVE BIOGEL PI INDICATOR 8.5 (GLOVE) ×1
GLOVE EXAM NITRILE LRG STRL (GLOVE) IMPLANT
GLOVE EXAM NITRILE MD LF STRL (GLOVE) IMPLANT
GLOVE EXAM NITRILE XL STR (GLOVE) IMPLANT
GLOVE EXAM NITRILE XS STR PU (GLOVE) IMPLANT
GLOVE INDICATOR 8.5 STRL (GLOVE) ×1 IMPLANT
GLOVE SURG SS PI 8.0 STRL IVOR (GLOVE) ×2 IMPLANT
GOWN BRE IMP SLV AUR LG STRL (GOWN DISPOSABLE) ×2 IMPLANT
GOWN BRE IMP SLV AUR XL STRL (GOWN DISPOSABLE) IMPLANT
GOWN STRL REIN 2XL LVL4 (GOWN DISPOSABLE) IMPLANT
HEMOSTAT POWDER SURGIFOAM 1G (HEMOSTASIS) ×1 IMPLANT
KIT BASIN OR (CUSTOM PROCEDURE TRAY) ×2 IMPLANT
KIT ROOM TURNOVER OR (KITS) ×2 IMPLANT
NDL HYPO 18GX1.5 BLUNT FILL (NEEDLE) IMPLANT
NDL HYPO 21X1.5 SAFETY (NEEDLE) IMPLANT
NDL HYPO 25X1 1.5 SAFETY (NEEDLE) IMPLANT
NDL SPNL 20GX3.5 QUINCKE YW (NEEDLE) IMPLANT
NEEDLE HYPO 18GX1.5 BLUNT FILL (NEEDLE) IMPLANT
NEEDLE HYPO 21X1.5 SAFETY (NEEDLE) IMPLANT
NEEDLE HYPO 25X1 1.5 SAFETY (NEEDLE) ×2 IMPLANT
NEEDLE SPNL 20GX3.5 QUINCKE YW (NEEDLE) ×2 IMPLANT
NS IRRIG 1000ML POUR BTL (IV SOLUTION) ×2 IMPLANT
PACK LAMINECTOMY NEURO (CUSTOM PROCEDURE TRAY) ×2 IMPLANT
PAD ARMBOARD 7.5X6 YLW CONV (MISCELLANEOUS) ×6 IMPLANT
PATTIES SURGICAL .5 X1 (DISPOSABLE) ×1 IMPLANT
RUBBERBAND STERILE (MISCELLANEOUS) ×4 IMPLANT
SPONGE GAUZE 4X4 12PLY (GAUZE/BANDAGES/DRESSINGS) ×2 IMPLANT
SPONGE LAP 4X18 X RAY DECT (DISPOSABLE) IMPLANT
SPONGE SURGIFOAM ABS GEL 100 (HEMOSTASIS) ×1 IMPLANT
SPONGE SURGIFOAM ABS GEL SZ50 (HEMOSTASIS) ×1 IMPLANT
STRIP CLOSURE SKIN 1/2X4 (GAUZE/BANDAGES/DRESSINGS) ×2 IMPLANT
SUT VIC AB 0 CT1 18XCR BRD8 (SUTURE) ×1 IMPLANT
SUT VIC AB 0 CT1 8-18 (SUTURE) ×4
SUT VIC AB 2-0 CP2 18 (SUTURE) ×2 IMPLANT
SUT VIC AB 3-0 SH 8-18 (SUTURE) ×3 IMPLANT
SYR 20CC LL (SYRINGE) IMPLANT
SYR 20ML ECCENTRIC (SYRINGE) ×2 IMPLANT
SYR 5ML LL (SYRINGE) IMPLANT
TOWEL OR 17X24 6PK STRL BLUE (TOWEL DISPOSABLE) ×2 IMPLANT
TOWEL OR 17X26 10 PK STRL BLUE (TOWEL DISPOSABLE) ×2 IMPLANT
WATER STERILE IRR 1000ML POUR (IV SOLUTION) ×2 IMPLANT

## 2012-11-21 NOTE — Preoperative (Signed)
Beta Blockers   Reason not to administer Beta Blockers:Not Applicable, took this AM 

## 2012-11-21 NOTE — H&P (Signed)
[name] is an 62 y.o. male.   Chief Complaint: lbp HPI: patient with lbp with radiation to both lower extremities, getting worse lately and failure with conservative treatment. Because of radiological findings , he is being admitted for surgery  Past Medical History  Diagnosis Date  . Obesity   . Hypertension   . Heart valve problem     Dr [name]  . Atrial fibrillation     sees Dr. [name]   . Other and unspecified hyperlipidemia   . Other and unspecified hyperlipidemia   . Mitral valve disorders   . OSA (obstructive sleep apnea)     cpap    Past Surgical History  Procedure Laterality Date  . Colonoscopy  03-16-10    per Dr. Russella Dar, benign polyp, repeat in 5 yrs   . No past surgeries      Family History  Problem Relation Age of Onset  . Stroke Father   . Coronary artery disease    . Hypertension     Social History:  reports that he has never smoked. He has never used smokeless tobacco. He reports that  drinks alcohol. He reports that he does not use illicit drugs.  Allergies:  Allergies  Allergen Reactions  . Penicillins     REACTION: unspecified    Medications Prior to Admission  Medication Sig Dispense Refill  . amLODipine (NORVASC) 10 MG tablet Take 1 tablet (10 mg total) by mouth daily.  30 tablet  11  . aspirin 81 MG tablet Take 81 mg by mouth daily.        . carvedilol (COREG) 25 MG tablet Take 25 mg by mouth 2 (two) times daily with a meal.      . cloNIDine (CATAPRES) 0.1 MG tablet Take 1 tablet (0.1 mg total) by mouth 2 (two) times daily.  60 tablet  11  . docusate sodium (COLACE) 100 MG capsule Take 100 mg by mouth 3 (three) times daily as needed.       . fish oil-omega-3 fatty acids 1000 MG capsule Take 1 g by mouth daily.        . hydrochlorothiazide (MICROZIDE) 12.5 MG capsule Take 1 capsule (12.5 mg total) by mouth daily.  30 capsule  11  . losartan (COZAAR) 100 MG tablet Take 1 tablet (100 mg total) by mouth daily.  30 tablet  11  . simvastatin  (ZOCOR) 40 MG tablet Take 1 tablet (40 mg total) by mouth at bedtime.  30 tablet  11  . warfarin (COUMADIN) 5 MG tablet Take 7.5-10 mg by mouth daily. Take 7.5mg  tue, thurs and Sunday. Take 10mg  the rest of the week        Results for orders placed during the hospital encounter of 11/21/12 (from the past 48 hour(s))  PROTIME-INR     Status: None   Collection Time    11/21/12  6:26 AM      Result Value Range   Prothrombin Time 12.5  11.6 - 15.2 seconds   INR 0.95  0.00 - 1.49   No results found.  Review of Systems  Constitutional: Negative.   HENT: Negative.   Eyes: Negative.   Respiratory: Negative.   Cardiovascular:       Arterial hypertension  Atrial fibrilation  Gastrointestinal: Negative.   Genitourinary: Negative.   Musculoskeletal: Positive for back pain.  Skin: Negative.   Neurological: Positive for focal weakness.  Endo/Heme/Allergies: Negative.   Psychiatric/Behavioral: Negative.     Blood pressure 156/109,  pulse 71, temperature 97.6 F (36.4 C), temperature source Oral, resp. rate 20, SpO2 98.00%. Physical Exam hent, nl. Neck, nl. Lungs, some ronchii, cv, nl. Abdomen, soft. Extremities, nl. Neuro, weakness of DF both feet. SLR positive at 60 degrees. Femoral stretch positive bilaterally. Mri shows severe stenosis at l34, 45  Assessment/Plan sdecompression at l3,4,5. Patient aware of risks and benefits  [name] M 11/21/2012, 8:42 AM

## 2012-11-21 NOTE — Transfer of Care (Signed)
Immediate Anesthesia Transfer of Care Note  Patient: [name]  Procedure(s) Performed: Procedure(s) with comments: Lumbar Three to Lumbar Five Laminectomy (N/A) - Lumbar Three to Lumbar Five Laminectomy  Patient Location: PACU  Anesthesia Type:General  Level of Consciousness: patient cooperative and responds to stimulation  Airway & Oxygen Therapy: Patient Spontanous Breathing and Patient connected to nasal cannula oxygen  Post-op Assessment: Report given to PACU RN, Post -op Vital signs reviewed and stable and Patient moving all extremities X 4  Post vital signs: Reviewed and stable  Complications: No apparent anesthesia complications

## 2012-11-21 NOTE — Plan of Care (Signed)
Problem: Consults Goal: Diagnosis - Spinal Surgery Outcome: Completed/Met Date Met:  11/21/12 Lumbar Laminectomy (Complex)

## 2012-11-21 NOTE — Progress Notes (Signed)
Patient refused to wear cpap tonight. RT will continue to monitor. 

## 2012-11-21 NOTE — Anesthesia Preprocedure Evaluation (Addendum)
Anesthesia Evaluation  Patient identified by MRN, date of birth, ID band Patient awake    Reviewed: Allergy & Precautions, H&P , NPO status , Patient's Chart, lab work & pertinent test results, reviewed documented beta blocker date and time   Airway Mallampati: II TM Distance: >3 FB Neck ROM: Full    Dental  (+) Poor Dentition, Dental Advisory Given and Loose   Pulmonary sleep apnea and Continuous Positive Airway Pressure Ventilation ,  breath sounds clear to auscultation  + decreased breath sounds      Cardiovascular hypertension, Pt. on medications and Pt. on home beta blockers + Peripheral Vascular Disease + dysrhythmias Atrial Fibrillation + Valvular Problems/Murmurs AS and MR  Study Conclusions 2013  - Left ventricle: The cavity size was normal. Wall thickness   was increased in a pattern of mild LVH. The estimated   ejection fraction was 60%. Wall motion was normal; there   were no regional wall motion abnormalities. The study is   not technically sufficient to allow evaluation of LV   diastolic function. - Aortic valve: Aortic valve sclerosis with very mild   stenosis. Trivial regurgitation.   Neuro/Psych    GI/Hepatic negative GI ROS, Neg liver ROS,   Endo/Other  Morbid obesity  Renal/GU negative Renal ROS     Musculoskeletal   Abdominal (+) + obese,   Peds  Hematology negative hematology ROS (+)   Anesthesia Other Findings   Reproductive/Obstetrics                          Anesthesia Physical Anesthesia Plan  ASA: III  Anesthesia Plan: General   Post-op Pain Management:    Induction: Intravenous  Airway Management Planned: Oral ETT  Additional Equipment:   Intra-op Plan:   Post-operative Plan: Extubation in OR  Informed Consent:   Plan Discussed with: CRNA, Anesthesiologist and Surgeon  Anesthesia Plan Comments:         Anesthesia Quick Evaluation

## 2012-11-21 NOTE — Progress Notes (Signed)
Op note [phone]

## 2012-11-21 NOTE — Anesthesia Postprocedure Evaluation (Signed)
  Anesthesia Post-op Note  Patient: [name]  Procedure(s) Performed: Procedure(s) with comments: Lumbar Three to Lumbar Five Laminectomy (N/A) - Lumbar Three to Lumbar Five Laminectomy  Patient Location: PACU  Anesthesia Type:General  Level of Consciousness: awake, oriented, sedated and patient cooperative  Airway and Oxygen Therapy: Patient Spontanous Breathing  Post-op Pain: mild  Post-op Assessment: Post-op Vital signs reviewed, Patient's Cardiovascular Status Stable, Respiratory Function Stable, Patent Airway, No signs of Nausea or vomiting and Pain level controlled  Post-op Vital Signs: stable  Complications: No apparent anesthesia complications

## 2012-11-21 NOTE — Progress Notes (Addendum)
ANTIBIOTIC CONSULT NOTE - INITIAL  Pharmacy Consult for Vancomycin Indication: post-op prophylaxis  Allergies  Allergen Reactions  . Penicillins     REACTION: unspecified    Patient Measurements: Height: 5\' 11"  (180.3 cm) Weight: 301 lb 3 oz (136.618 kg) IBW/kg (Calculated) : 75.3 Adjusted Body Weight: 137 kg  Vital Signs: Temp: 98.2 F (36.8 C) (09/17 1235) Temp src: Oral (09/17 0626) BP: 97/65 mmHg (09/17 1235) Pulse Rate: 68 (09/17 1235) Intake/Output from previous day:   Intake/Output from this shift: Total I/O In: 1500 [I.V.:1500] Out: 300 [Drains:100; Blood:200]  Labs: No results found for this basename: WBC, HGB, PLT, LABCREA, CREATININE,  in the last 72 hours Estimated Creatinine Clearance: 135.1 ml/min (by C-G formula based on Cr of 0.76). No results found for this basename: VANCOTROUGH, Leodis Binet, VANCORANDOM, GENTTROUGH, GENTPEAK, GENTRANDOM, TOBRATROUGH, TOBRAPEAK, TOBRARND, AMIKACINPEAK, AMIKACINTROU, AMIKACIN,  in the last 72 hours   Microbiology: Recent Results (from the past 720 hour(s))  SURGICAL PCR SCREEN     Status: Abnormal   Collection Time    11/13/12 11:45 AM      Result Value Range Status   MRSA, PCR NEGATIVE  NEGATIVE Final   Staphylococcus aureus POSITIVE (*) NEGATIVE Final   Comment:            The Xpert SA Assay (FDA     approved for NASAL specimens     in patients over 63 years of age),     is one component of     a comprehensive surveillance     program.  Test performance has     been validated by [employer] for patients greater     than or equal to 74 year old.     It is not intended     to diagnose infection nor to     guide or monitor treatment.    Medical History: Past Medical History  Diagnosis Date  . Obesity   . Hypertension   . Heart valve problem     Dr [name]  . Atrial fibrillation     sees Dr. [name]   . Other and unspecified hyperlipidemia   . Other and unspecified hyperlipidemia   . Mitral  valve disorders   . OSA (obstructive sleep apnea)     cpap    Medications:  Scheduled:  . [START ON 11/22/2012] amLODipine  10 mg Oral Daily  . atorvastatin  20 mg Oral q1800  . carvedilol  25 mg Oral BID WC  . cloNIDine  0.1 mg Oral BID  . hydrochlorothiazide  12.5 mg Oral Daily  . HYDROmorphone      . losartan  100 mg Oral Daily  . morphine   Intravenous Q4H  . sodium chloride  3 mL Intravenous Q12H   Assessment: 62yo morbidly obese man, s/p L3-5 laminectomy.  He received Vancomycin 1500mg  IV x 1 preop, ~0900.  Cr is wnl.  Pt was on Coumadin pta, for AFib  Goal of Therapy:  Vanc trough 10-15  Plan:  1.  Vancomycin 1250mg  IV q12, next dose at 2100 2.  F/U plans for duration of therapy 3.  F/U plans regarding when to resume Coumadin  Marisue Humble, PharmD Clinical Pharmacist [facility]

## 2012-11-21 NOTE — Op Note (Signed)
[name], [name]                  ACCOUNT NO.:  [id number]  MEDICAL RECORD NO.:  [id number]  LOCATION:  3C09C                        FACILITY:  [facility]  PHYSICIAN:  Hilda Lias, M.D.   DATE OF BIRTH:  1951-02-05  DATE OF PROCEDURE:  11/21/2012 DATE OF DISCHARGE:                              OPERATIVE REPORT   PREOPERATIVE DIAGNOSIS:  Lumbar stenosis with neurogenic claudication L3- L5.  POSTOPERATIVE DIAGNOSIS:  Lumbar stenosis with neurogenic claudication L3-L5.  PROCEDURE:  Bilateral 3, 4, and 5 laminectomy, foraminotomy, decompression of the thecal sac.  SURGEON:  Hilda Lias, M.D.  ASSISTANT:  Donalee Citrin, M.D.  CLINICAL HISTORY:  [name] is a gentleman, obese, hypertensive, on Coumadin who has been complaining of back pain worsened to both lower extremities.  There is a failure with conservative treatment.  MRI shows severe stenosis from 3-4 down to 5-1.  The patient also has a history of degenerative disk disease.  Surgery was advised and the risks were explained to him.  DESCRIPTION OF PROCEDURE:  The patient was taken to the OR, and after intubation, he was positioned in a prone manner.  The back was cleaned with Betadine and DuraPrep.  Drapes were applied.  Because his obesity was difficult to feel any bony structure.  Nevertheless, we did an incision in the midline until the skin, subcutaneous tissue, and to a thick adipose tissue.  With the muscle retractor, we were able to fill the spinous process.  We proceeded with removal of the spinous process of 5, 4, and 3.  With the drill, we did bilateral laminectomies 3-4, 4- 5, and 5-1.  With the Kerrison punch, the yellow ligament was removed, and decompression laterally was achieved with the foramen being open with the Kerrison punch.  At the end, we have a good decompression of the thecal sac as well foraminotomy to decompress from L3-L5, and S1. The area was irrigated.  Valsalva maneuver was negative.  X-ray  after the procedure showed that indeed we were in the right location from 3-5, 1.  Then the muscle was closed with Vicryl and subcutaneous stitches to close the skin was accomplished and the skin was closed finally with Steri-Strips.  Hemovac was left in the AP dural space.  The patient did well and he is going to go to PACU.          ______________________________ Hilda Lias, M.D.     EB/MEDQ  D:  11/21/2012  T:  11/21/2012  Job:  [id number]

## 2012-11-22 ENCOUNTER — Encounter (HOSPITAL_COMMUNITY): Payer: Self-pay | Admitting: Neurosurgery

## 2012-11-22 MED ORDER — DOCUSATE SODIUM 100 MG PO CAPS
100.0000 mg | ORAL_CAPSULE | Freq: Every day | ORAL | Status: DC
  Administered 2012-11-22 – 2012-11-23 (×2): 100 mg via ORAL
  Filled 2012-11-22: qty 1

## 2012-11-22 NOTE — Progress Notes (Signed)
Pt. Refused cpap for tonight. 

## 2012-11-22 NOTE — Progress Notes (Signed)
Occupational Therapy Treatment Patient Details Name: [name] MRN: [id number] DOB: Mar 13, 1950 Today's Date: 11/22/2012 Time: 4098-1191 OT Time Calculation (min): 25 min  OT Assessment / Plan / Recommendation  History of present illness s/p L3-L4, L4-L5 laminectomy   OT comments  Pt seen this pm for further education on bed mobility, proper bed positioning and ADL retraining using AE. Educated pt on how to use reacher to assist with IADL tasks, i.e. Moving bed sheets, laudry etc. Pt very appreciative of services/information. Will plan to see pt for additional session prior to D/C in am  Follow Up Recommendations  Home health OT    Barriers to Discharge  Decreased caregiver support    Equipment Recommendations  None recommended by OT    Recommendations for Other Services    Frequency Min 2X/week   Progress towards OT Goals Progress towards OT goals: Progressing toward goals  Plan Discharge plan remains appropriate    Precautions / Restrictions Precautions Precautions: Back Precaution Booklet Issued: Yes (comment) Precaution Comments: pt able to recall back precautions Restrictions Weight Bearing Restrictions: No   Pertinent Vitals/Pain no apparent distress     ADL  Grooming: Supervision/safety;Set up Where Assessed - Grooming: Supported sitting Upper Body Bathing: Supervision/safety;Set up Where Assessed - Upper Body Bathing: Unsupported sitting Lower Body Bathing: Minimal assistance Where Assessed - Lower Body Bathing: Unsupported sit to stand Upper Body Dressing: Supervision/safety;Set up Where Assessed - Upper Body Dressing: Unsupported sitting Lower Body Dressing: Minimal assistance Where Assessed - Lower Body Dressing: Supported sit to stand Toilet Transfer: Supervision/safety Statistician Method: Other (comment) (ambulating) Acupuncturist: Comfort height toilet Toileting - Clothing Manipulation and Hygiene: Moderate assistance Where Assessed -  Toileting Clothing Manipulation and Hygiene: Sit to stand from 3-in-1 or toilet Equipment Used: Reacher;Sock aid;Long-handled shoe horn;Long-handled sponge;Other (comment) (toilet aid) Transfers/Ambulation Related to ADLs: S ADL Comments: Educated pt on AE for LB ADL. Pt given information on toilet aid and compensatory techniques. Pt required min a with AE. Given wide sock aid and elastic shoestrings    OT Diagnosis: Generalized weakness;Acute pain  OT Problem List: Decreased strength;Decreased activity tolerance;Decreased safety awareness;Decreased knowledge of use of DME or AE;Decreased knowledge of precautions;Obesity;Pain OT Treatment Interventions: Self-care/ADL training;Energy conservation;DME and/or AE instruction;Therapeutic activities;Patient/family education   OT Goals(current goals can now be found in the care plan section) Acute Rehab OT Goals Patient Stated Goal: To go home today OT Goal Formulation: With patient Time For Goal Achievement: 11/29/12 Potential to Achieve Goals: Good  Visit Information  Last OT Received On: 11/22/12 Assistance Needed: +1 History of Present Illness: s/p L3-L4, L4-L5 laminectomy    Subjective Data      Prior Functioning  Home Living Family/patient expects to be discharged to:: Private residence Living Arrangements: Alone Available Help at Discharge: Family;Available PRN/intermittently Type of Home: Apartment Home Access: Level entry Home Layout: One level Home Equipment: Walker - 2 wheels;Other (comment) (Has access to 3n1) Prior Function Level of Independence: Independent Communication Communication: No difficulties Dominant Hand: Right    Cognition  Cognition Arousal/Alertness: Awake/alert Behavior During Therapy: WFL for tasks assessed/performed Overall Cognitive Status: Within Functional Limits for tasks assessed    Mobility  Bed Mobility Bed Mobility: Sit to Supine;Supine to Sit Rolling Left: 5: Supervision Left Sidelying  to Sit: 5: Supervision Sit to Sidelying Left: 5: Supervision Details for Bed Mobility Assistance: vc for log rolling Transfers Sit to Stand: 5: Supervision;From chair/3-in-1 Stand to Sit: 6: Modified independent (Device/Increase time);To chair/3-in-1 Details for Transfer Assistance: Pt  needs extra time to complete transfer    Exercises      Balance Balance Balance Assessed:  [facility] for ADL)   End of Session OT - End of Session Activity Tolerance: Patient tolerated treatment well Patient left: in bed;with call bell/phone within reach Nurse Communication: Mobility status  GO     [name],[name] 11/22/2012, 3:32 PM [facility], OTR/L  [phone] 11/22/2012

## 2012-11-22 NOTE — Evaluation (Signed)
Physical Therapy Evaluation Patient Details Name: [name] MRN: [id number] DOB: 05-12-1950 Today's Date: 11/22/2012 Time: 0865-7846 PT Time Calculation (min): 26 min  PT Assessment / Plan / Recommendation History of Present Illness  s/p L3-L4, L4-L5 laminectomy  Clinical Impression  Patient is s/p L3-L5 laminectomy surgery resulting in the deficits listed below (see PT Problem List).  Patient will benefit from skilled PT to increase their independence and safety with mobility (while adhering to their precautions) to allow discharge     PT Assessment  Patient needs continued PT services    Follow Up Recommendations  Home health PT;Supervision - Intermittent    Equipment Recommendations  None recommended by PT    Frequency Min 5X/week    Precautions / Restrictions Precautions Precautions: Back Precaution Comments: Educated on 3/3 back precautions Restrictions Weight Bearing Restrictions: No   Pertinent Vitals/Pain 4/10 back; premedicated      Mobility  Bed Mobility Bed Mobility: Not assessed Transfers Transfers: Sit to Stand;Stand to Sit Sit to Stand: 5: Supervision;From chair/3-in-1 Stand to Sit: 6: Modified independent (Device/Increase time);To chair/3-in-1 Details for Transfer Assistance: Pt needs extra time to complete transfer Ambulation/Gait Ambulation/Gait Assistance: 5: Supervision Ambulation Distance (Feet): 200 Feet Assistive device: None Ambulation/Gait Assistance Details: Supervision for safety; ambulates with gaurded gait; initially pt would hold onto objects in room to stabilize however able to improve to walking in hallway without attempting to hold on to rails. Gait Pattern: Step-through pattern;Decreased stride length;Antalgic;Decreased trunk rotation;Wide base of support Gait velocity: slow Stairs: No    Exercises     PT Diagnosis: Difficulty walking;Acute pain  PT Problem List: Decreased activity tolerance;Decreased mobility;Decreased knowledge  of use of DME;Pain PT Treatment Interventions: DME instruction;Gait training;Functional mobility training;Therapeutic activities;Therapeutic exercise;Patient/family education;Balance training     PT Goals(Current goals can be found in the care plan section) Acute Rehab PT Goals Patient Stated Goal: To go home today PT Goal Formulation: With patient Time For Goal Achievement: 11/29/12 Potential to Achieve Goals: Good  Visit Information  Last PT Received On: 11/22/12 Assistance Needed: +1 History of Present Illness: s/p L3-L4, L4-L5 laminectomy       Prior Functioning  Home Living Family/patient expects to be discharged to:: Private residence Living Arrangements: Alone Available Help at Discharge: Family;Available PRN/intermittently Type of Home: Apartment Home Access: Level entry Home Layout: One level Home Equipment: Walker - 2 wheels;Other (comment) (Has access to 3n1) Prior Function Level of Independence: Independent Communication Communication: No difficulties Dominant Hand: Right    Cognition  Cognition Arousal/Alertness: Awake/alert Behavior During Therapy: WFL for tasks assessed/performed Overall Cognitive Status: Within Functional Limits for tasks assessed    Extremity/Trunk Assessment Lower Extremity Assessment Lower Extremity Assessment: Overall WFL for tasks assessed   Balance    End of Session PT - End of Session Equipment Utilized During Treatment: Gait belt Activity Tolerance: Patient tolerated treatment well Patient left: in chair;with call bell/phone within reach Nurse Communication: Mobility status  GP     [name] 11/22/2012, 8:59 AM  Jake Shark, PT DPT [phone]

## 2012-11-22 NOTE — Progress Notes (Signed)
Patient ID: [name], male   DOB: 1950/07/09, 62 y.o.   MRN: [id number] Ambul;ating, leg pain better. Was oob. hemovec to be dc  This pm

## 2012-11-22 NOTE — Progress Notes (Signed)
Occupational Therapy Evaluation Patient Details Name: Westen Dinino MRN: [id number] DOB: November 14, 1950 Today's Date: 11/22/2012 Time: 4098-1191 OT Time Calculation (min): 26 min  OT Assessment / Plan / Recommendation History of present illness s/p L3-L4, L4-L5 laminectomy   Clinical Impression   PTA, pt lived independently, alone in apt. Pt presents with deficits with ADL and mobility for ADL s/p back surgery. Educated pt on back precautions. Pt having difficulty getting in of bed. Educated pt on log rolling techniques. Will educate pt on use of AE for LB ADL. Pt will benefit from skilled OT services to facilitate D/C to next venue due to below deficits. Educated pt on alternative positioning in bed in preparation for D/C home. Pt in side lying with ice to back after session. Will see again this pm and again in am prior to D/C. Rec HHOT follow up with pt given that he lives alone.     OT Assessment  Patient needs continued OT Services    Follow Up Recommendations  Home health OT    Barriers to Discharge Decreased caregiver support    Equipment Recommendations  rec use of shower seat, but pt declined   Recommendations for Other Services    Frequency  Min 2X/week    Precautions / Restrictions Precautions Precautions: Back Precaution Booklet Issued: Yes (comment) Precaution Comments: Educated on 3/3 back precautions Restrictions Weight Bearing Restrictions: No   Pertinent Vitals/Pain 4 back. Repositioned. Ice to back    ADL  Grooming: Supervision/safety;Set up Where Assessed - Grooming: Supported sitting Upper Body Bathing: Supervision/safety;Set up Where Assessed - Upper Body Bathing: Unsupported sitting Lower Body Bathing: Moderate assistance Where Assessed - Lower Body Bathing: Supported sit to stand Upper Body Dressing: Supervision/safety;Set up Where Assessed - Upper Body Dressing: Unsupported sitting Lower Body Dressing: Maximal assistance Where Assessed - Lower Body  Dressing: Unsupported sit to stand Toilet Transfer: Supervision/safety Toilet Transfer Method: Other (comment) (ambulating) Toilet Transfer Equipment: Comfort height toilet Toileting - Clothing Manipulation and Hygiene: Moderate assistance Where Assessed - Toileting Clothing Manipulation and Hygiene: Sit to stand from 3-in-1 or toilet Transfers/Ambulation Related to ADLs: S ADL Comments: will benefit form AE    OT Diagnosis: Generalized weakness;Acute pain  OT Problem List: Decreased strength;Decreased activity tolerance;Decreased safety awareness;Decreased knowledge of use of DME or AE;Decreased knowledge of precautions;Obesity;Pain OT Treatment Interventions: Self-care/ADL training;Energy conservation;DME and/or AE instruction;Therapeutic activities;Patient/family education   OT Goals(Current goals can be found in the care plan section) Acute Rehab OT Goals Patient Stated Goal: To go home today OT Goal Formulation: With patient Time For Goal Achievement: 11/29/12 Potential to Achieve Goals: Good  Visit Information  Last OT Received On: 11/22/12 Assistance Needed: +1 History of Present Illness: s/p L3-L4, L4-L5 laminectomy       Prior Functioning     Home Living Family/patient expects to be discharged to:: Private residence Living Arrangements: Alone Available Help at Discharge: Family;Available PRN/intermittently Type of Home: Apartment Home Access: Level entry Home Layout: One level Home Equipment: Walker - 2 wheels;Other (comment) (Has access to 3n1) Prior Function Level of Independence: Independent Communication Communication: No difficulties Dominant Hand: Right         Vision/Perception     Cognition  Cognition Arousal/Alertness: Awake/alert Behavior During Therapy: WFL for tasks assessed/performed Overall Cognitive Status: Within Functional Limits for tasks assessed    Extremity/Trunk Assessment Upper Extremity Assessment Upper Extremity Assessment:  Overall WFL for tasks assessed Lower Extremity Assessment Lower Extremity Assessment: Defer to PT evaluation Cervical / Trunk Assessment Cervical /  Trunk Assessment: Other exceptions (lami)     Mobility Bed Mobility Bed Mobility: Rolling Left;Left Sidelying to Sit Rolling Left: 5: Supervision Left Sidelying to Sit: 4: Min assist Sit to Sidelying Left: 4: Min assist;HOB flat;With rail Details for Bed Mobility Assistance: vc for log rolling Transfers Sit to Stand: 5: Supervision;From chair/3-in-1 Stand to Sit: 6: Modified independent (Device/Increase time);To chair/3-in-1 Details for Transfer Assistance: Pt needs extra time to complete transfer     Exercise     Balance Balance Balance Assessed:  (WFL for ADL)   End of Session OT - End of Session Activity Tolerance: Patient tolerated treatment well Patient left: in bed;with call bell/phone within reach Nurse Communication: Mobility status;Other (comment) (need for [facility] services)  GO     Tabithia Stroder,HILLARY 11/22/2012, 3:23 PM [facility], OTR/L  [phone] 11/22/2012

## 2012-11-23 NOTE — Discharge Summary (Signed)
Physician Discharge Summary  Patient ID: [name] MRN: [id number] DOB/AGE: Jul 24, 1950 62 y.o.  Admit date: 11/21/2012 Discharge date: 11/23/2012  Admission Diagnoses:lumbar stenosis  Discharge Diagnoses:  same Active Problems:   * No active hospital problems. *   Discharged Condition: no pain, ambulating  Hospital Course: surgery  Consults: none  Significant Diagnostic Studies: mri  Treatments:lumbar decompression  Discharge Exam: Blood pressure 144/88, pulse 87, temperature 98.1 F (36.7 C), temperature source Oral, resp. rate 18, height 5\' 11"  (1.803 m), weight 136.618 kg (301 lb 3 oz), SpO2 97.00%.no weakness. ambulating Disposition:    Future Appointments Provider Department Dept Phone   06/24/2013 9:00 AM Barbaraann Share, MD [facility] [phone]       Medication List    ASK your doctor about these medications       amLODipine 10 MG tablet  Commonly known as:  NORVASC  Take 1 tablet (10 mg total) by mouth daily.     aspirin 81 MG tablet  Take 81 mg by mouth daily.     carvedilol 25 MG tablet  Commonly known as:  COREG  Take 25 mg by mouth 2 (two) times daily with a meal.     cloNIDine 0.1 MG tablet  Commonly known as:  CATAPRES  Take 1 tablet (0.1 mg total) by mouth 2 (two) times daily.     docusate sodium 100 MG capsule  Commonly known as:  COLACE  Take 100 mg by mouth 3 (three) times daily as needed.     fish oil-omega-3 fatty acids 1000 MG capsule  Take 1 g by mouth daily.     hydrochlorothiazide 12.5 MG capsule  Commonly known as:  MICROZIDE  Take 1 capsule (12.5 mg total) by mouth daily.     losartan 100 MG tablet  Commonly known as:  COZAAR  Take 1 tablet (100 mg total) by mouth daily.     simvastatin 40 MG tablet  Commonly known as:  ZOCOR  Take 1 tablet (40 mg total) by mouth at bedtime.     warfarin 5 MG tablet  Commonly known as:  COUMADIN  Take 7.5-10 mg by mouth daily. Take 7.5mg  tue, thurs and Sunday. Take 10mg   the rest of the week     warfarin 5 MG tablet  Commonly known as:  COUMADIN  TAKE AS DIRECTED BY ANTICOAGULATION CLINIC         Signed: Karn Cassis 11/23/2012, 12:00 PM

## 2012-11-23 NOTE — Progress Notes (Signed)
11/23/12 Spoke with patient about HHC. He chose [facility] from the [facility] agencies [employer] at [facility] and set up HHPT and HHOT. Patient states that he has a rolling walker at home. No equipment needs identified. Jacquelynn Cree RN, BSN, CCM

## 2012-11-23 NOTE — Progress Notes (Signed)
Occupational Therapy Treatment Patient Details Name: [name] MRN: [id number] DOB: November 01, 1950 Today's Date: 11/23/2012 Time: 4098-1191 OT Time Calculation (min): 21 min  OT Assessment / Plan / Recommendation  History of present illness s/p L3-L4, L4-L5 laminectomy   OT comments  Reviewed back precautions with ADL and use of AE. Pt's questions answered and pt feels comfortable with D/C home. Rec HHOT given that pt lives alone. Ready for D/C.  Follow Up Recommendations  Home health OT    Barriers to Discharge       Equipment Recommendations  None recommended by OT    Recommendations for Other Services    Frequency Min 2X/week   Progress towards OT Goals Progress towards OT goals: Progressing toward goals  Plan Discharge plan remains appropriate    Precautions / Restrictions Precautions Precautions: Back Precaution Booklet Issued: Yes (comment) Precaution Comments: pt able to recall back precautions Restrictions Weight Bearing Restrictions: No   Pertinent Vitals/Pain 4. back    ADL  ADL Comments: Reviewed back precautions and use of AE. Pt with questions regarding toilet aid.    OT Diagnosis:    OT Problem List:   OT Treatment Interventions:     OT Goals(current goals can now be found in the care plan section) Acute Rehab OT Goals Patient Stated Goal: To go home today OT Goal Formulation: With patient Time For Goal Achievement: 11/29/12 Potential to Achieve Goals: Good  Visit Information  Last OT Received On: 11/23/12 Assistance Needed: +1 History of Present Illness: s/p L3-L4, L4-L5 laminectomy    Subjective Data      Prior Functioning       Cognition  Cognition Arousal/Alertness: Awake/alert Behavior During Therapy: WFL for tasks assessed/performed Overall Cognitive Status: Within Functional Limits for tasks assessed    Mobility  Bed Mobility Details for Bed Mobility Assistance: mod I Transfers Transfers: Sit to Stand;Stand to Sit Sit to Stand:  6: Modified independent (Device/Increase time) Stand to Sit: 6: Modified independent (Device/Increase time) Details for Transfer Assistance: Pt needs extra time to complete transfer, pt noted to partially stand to get balance  holding onto armrests, then completing the standing up straight.    Exercises      Balance Balance Balance Assessed: Yes Static Standing Balance Static Standing - Balance Support: No upper extremity supported Static Standing - Level of Assistance: 7: Independent;6: Modified independent (Device/Increase time)   End of Session OT - End of Session Activity Tolerance: Patient tolerated treatment well Patient left: in chair;with call bell/phone within reach Nurse Communication: Mobility status  GO     Hayley Horn,HILLARY 11/23/2012, 11:10 AM Luisa Dago, OTR/L  [phone] 11/23/2012

## 2012-11-23 NOTE — Progress Notes (Signed)
Physical Therapy Treatment Patient Details Name: [name] MRN: [id number] DOB: Dec 30, 1950 Today's Date: 11/23/2012 Time: 7829-5621 PT Time Calculation (min): 12 min  PT Assessment / Plan / Recommendation  History of Present Illness s/p L3-L4, L4-L5 laminectomy   PT Comments    Pt mobilizing without assistance. Pt plans to DC today.   Follow Up Recommendations  Home health PT;Supervision - Intermittent     Does the patient have the potential to tolerate intense rehabilitation     Barriers to Discharge        Equipment Recommendations  None recommended by PT    Recommendations for Other Services    Frequency Min 5X/week   Progress towards PT Goals Progress towards PT goals: Progressing toward goals  Plan Current plan remains appropriate    Precautions / Restrictions Precautions Precautions: Back Precaution Comments: pt able to recall back precautions   Pertinent Vitals/Pain 5 bsack, has had pain meds.    Mobility  Transfers Sit to Stand: 5: Supervision;From chair/3-in-1 Stand to Sit: 6: Modified independent (Device/Increase time) Details for Transfer Assistance: Pt needs extra time to complete transfer, pt noted to partially stand to get balance  holding onto armrests, then completing the standing up straight. Ambulation/Gait Ambulation/Gait Assistance: 5: Supervision Ambulation Distance (Feet): 200 Feet Assistive device: None Ambulation/Gait Assistance Details: pt declines need for RW but has if needed. Pt noted with posture of Arms extende, trunk rotated. Pt ambulates slowly but appears steady. In rom, pt did hold onto bed and wall at times. Gait Pattern: Step-through pattern;Decreased stride length;Antalgic;Decreased trunk rotation;Wide base of support Gait velocity: slow    Exercises     PT Diagnosis:    PT Problem List:   PT Treatment Interventions:     PT Goals (current goals can now be found in the care plan section)    Visit Information  Last PT  Received On: 11/23/12 Assistance Needed: +1 History of Present Illness: s/p L3-L4, L4-L5 laminectomy    Subjective Data      Cognition  Cognition Arousal/Alertness: Awake/alert    Balance  Balance Balance Assessed: Yes Static Standing Balance Static Standing - Balance Support: No upper extremity supported Static Standing - Level of Assistance: 7: Independent;6: Modified independent (Device/Increase time)  End of Session PT - End of Session Activity Tolerance: Patient tolerated treatment well Patient left: in chair;with call bell/phone within reach Nurse Communication: Mobility status   GP     Rada Hay 11/23/2012, 9:55 AM Blanchard Kelch PT [phone]

## 2012-11-23 NOTE — Progress Notes (Signed)
Pt doing well. Pt's HH set up prior to D/C. Pt given D/C instructions with Rx's, verbal understanding given. Pt D/C'd home via wheelchair @ 1440 per MD order. Pt stable @ D/C. Rema Fendt, RN

## 2012-11-24 ENCOUNTER — Telehealth: Payer: Self-pay | Admitting: Family Medicine

## 2012-11-27 NOTE — Telephone Encounter (Signed)
[facility] Triage Call Report Triage Record Num: [id number] Operator: Malachi Paradise Patient Name: [facility] Call Date & Time: 11/24/2012 3:02:29PM Patient Phone: [phone] PCP: Patient Gender: Male PCP Fax : Patient DOB: [birth year] [facility]  Reason for Call: Caller: Janet/Other; PCP: Gershon Crane (Family Practice); CB#: [phone]; Call regarding Message from Physical Therapist; per Marylu Lund, PT staes per her protocol she is to notify the patient's PCP of Level 1 drug interaction with Simvastatin 40 mg and Amlidopine 10 mg. Confirmed with [facility], Selena Batten, [facility] per Clinical Pharmacology showed as Level 2. Pt is monitored and has been on this medication for a long while.  Protocol(s) Used: Office Note Recommended Outcome per Protocol: Information Noted and Sent to Office Reason for Outcome: Caller information to office

## 2012-12-08 ENCOUNTER — Other Ambulatory Visit: Payer: Self-pay | Admitting: Family Medicine

## 2012-12-10 ENCOUNTER — Telehealth: Payer: Self-pay | Admitting: Family Medicine

## 2012-12-10 NOTE — Telephone Encounter (Signed)
Refill request for Losartan 100 mg qd, Amlodipine 10 mg qd, Hydrochlorothiazide 12.5 mg qd. Can we refill these and send to [facility]?

## 2012-12-11 NOTE — Telephone Encounter (Signed)
Refill all for one year 

## 2012-12-11 NOTE — Telephone Encounter (Signed)
Can we refill these? 

## 2012-12-12 MED ORDER — LOSARTAN POTASSIUM 100 MG PO TABS: 100.0000 mg | ORAL_TABLET | Freq: Every day | ORAL | Status: DC

## 2012-12-12 MED ORDER — HYDROCHLOROTHIAZIDE 12.5 MG PO CAPS: 12.5000 mg | ORAL_CAPSULE | Freq: Every day | ORAL | Status: DC

## 2012-12-12 MED ORDER — AMLODIPINE BESYLATE 10 MG PO TABS: 10.0000 mg | ORAL_TABLET | Freq: Every day | ORAL | Status: DC

## 2012-12-12 NOTE — Telephone Encounter (Signed)
I sent all 3 scripts e-scribe. 

## 2012-12-14 ENCOUNTER — Ambulatory Visit (INDEPENDENT_AMBULATORY_CARE_PROVIDER_SITE_OTHER): Payer: Managed Care, Other (non HMO) | Admitting: General Practice

## 2012-12-14 DIAGNOSIS — I4891 Unspecified atrial fibrillation: Secondary | ICD-10-CM

## 2012-12-14 DIAGNOSIS — Z7901 Long term (current) use of anticoagulants: Secondary | ICD-10-CM

## 2012-12-14 LAB — POCT INR: INR: 3.3

## 2012-12-28 ENCOUNTER — Ambulatory Visit (INDEPENDENT_AMBULATORY_CARE_PROVIDER_SITE_OTHER): Payer: Managed Care, Other (non HMO) | Admitting: Pharmacist

## 2012-12-28 DIAGNOSIS — Z7901 Long term (current) use of anticoagulants: Secondary | ICD-10-CM

## 2012-12-28 DIAGNOSIS — I4891 Unspecified atrial fibrillation: Secondary | ICD-10-CM

## 2012-12-28 LAB — POCT INR: INR: 2.5

## 2013-01-14 ENCOUNTER — Ambulatory Visit (INDEPENDENT_AMBULATORY_CARE_PROVIDER_SITE_OTHER): Payer: Managed Care, Other (non HMO) | Admitting: General Practice

## 2013-01-14 DIAGNOSIS — Z7901 Long term (current) use of anticoagulants: Secondary | ICD-10-CM

## 2013-01-14 DIAGNOSIS — I4891 Unspecified atrial fibrillation: Secondary | ICD-10-CM

## 2013-01-14 LAB — POCT INR: INR: 2.5

## 2013-02-09 ENCOUNTER — Other Ambulatory Visit: Payer: Self-pay | Admitting: Internal Medicine

## 2013-02-11 ENCOUNTER — Ambulatory Visit (INDEPENDENT_AMBULATORY_CARE_PROVIDER_SITE_OTHER): Payer: Managed Care, Other (non HMO) | Admitting: Pharmacist

## 2013-02-11 DIAGNOSIS — Z7901 Long term (current) use of anticoagulants: Secondary | ICD-10-CM

## 2013-02-11 DIAGNOSIS — I4891 Unspecified atrial fibrillation: Secondary | ICD-10-CM

## 2013-02-18 ENCOUNTER — Other Ambulatory Visit (INDEPENDENT_AMBULATORY_CARE_PROVIDER_SITE_OTHER): Payer: Managed Care, Other (non HMO)

## 2013-02-18 DIAGNOSIS — Z0000 Encounter for general adult medical examination without abnormal findings: Secondary | ICD-10-CM

## 2013-02-18 LAB — POCT URINALYSIS DIPSTICK
Ketones, UA: NEGATIVE
Protein, UA: NEGATIVE
Spec Grav, UA: 1.025

## 2013-02-18 LAB — LIPID PANEL
Cholesterol: 160 mg/dL (ref 0–200)
Triglycerides: 115 mg/dL (ref 0.0–149.0)
VLDL: 23 mg/dL (ref 0.0–40.0)

## 2013-02-18 LAB — CBC WITH DIFFERENTIAL/PLATELET
Basophils Absolute: 0 10*3/uL (ref 0.0–0.1)
HCT: 43.1 % (ref 39.0–52.0)
Lymphocytes Relative: 27.2 % (ref 12.0–46.0)
Lymphs Abs: 2.3 10*3/uL (ref 0.7–4.0)
Monocytes Relative: 8.3 % (ref 3.0–12.0)
Neutrophils Relative %: 62.8 % (ref 43.0–77.0)
Platelets: 219 10*3/uL (ref 150.0–400.0)
RDW: 13.6 % (ref 11.5–14.6)

## 2013-02-18 LAB — PSA: PSA: 0.18 ng/mL (ref 0.10–4.00)

## 2013-02-18 LAB — BASIC METABOLIC PANEL
BUN: 18 mg/dL (ref 6–23)
Calcium: 10.8 mg/dL — ABNORMAL HIGH (ref 8.4–10.5)
Creatinine, Ser: 0.9 mg/dL (ref 0.4–1.5)
GFR: 94.16 mL/min (ref >60.00)
Glucose, Bld: 89 mg/dL (ref 70–99)

## 2013-02-18 LAB — HEPATIC FUNCTION PANEL
Alkaline Phosphatase: 102 U/L (ref 39–117)
Bilirubin, Direct: 0.1 mg/dL (ref 0.0–0.3)
Total Protein: 7 g/dL (ref 6.0–8.3)

## 2013-02-18 LAB — TSH: TSH: 1.98 u[IU]/mL (ref 0.35–5.50)

## 2013-02-20 ENCOUNTER — Encounter: Payer: Self-pay | Admitting: Family Medicine

## 2013-02-22 ENCOUNTER — Other Ambulatory Visit: Payer: Managed Care, Other (non HMO)

## 2013-02-25 ENCOUNTER — Encounter: Payer: Self-pay | Admitting: Internal Medicine

## 2013-02-25 ENCOUNTER — Ambulatory Visit (INDEPENDENT_AMBULATORY_CARE_PROVIDER_SITE_OTHER): Payer: Managed Care, Other (non HMO) | Admitting: Internal Medicine

## 2013-02-25 VITALS — BP 159/88 | HR 60 | Ht 71.0 in | Wt 290.6 lb

## 2013-02-25 DIAGNOSIS — I4891 Unspecified atrial fibrillation: Secondary | ICD-10-CM

## 2013-02-25 DIAGNOSIS — I359 Nonrheumatic aortic valve disorder, unspecified: Secondary | ICD-10-CM

## 2013-02-25 DIAGNOSIS — I059 Rheumatic mitral valve disease, unspecified: Secondary | ICD-10-CM

## 2013-02-25 NOTE — Progress Notes (Signed)
HPI Patient is a 62 yo with a history of mitral regurgitation, HTN, afib, sleep apnea.  I saw him in Dec 2013 It has been a very stressful year.  Siser died.  Patient also developed back/leg problems   Breathing is stable.  Deneis palpitations  No CP   Using CPAP   Allergies  Allergen Reactions  . Penicillins     REACTION: unspecified    Current Outpatient Prescriptions  Medication Sig Dispense Refill  . amLODipine (NORVASC) 10 MG tablet TAKE 1 TABLET (10 MG TOTAL) BY MOUTH DAILY.  30 tablet  10  . aspirin 81 MG tablet Take 81 mg by mouth daily.        . carvedilol (COREG) 25 MG tablet Take 25 mg by mouth 2 (two) times daily with a meal.      . cloNIDine (CATAPRES) 0.1 MG tablet Take 1 tablet (0.1 mg total) by mouth 2 (two) times daily.  60 tablet  11  . docusate sodium (COLACE) 100 MG capsule Take 100 mg by mouth 3 (three) times daily as needed.       . fish oil-omega-3 fatty acids 1000 MG capsule Take 1 g by mouth daily.        . hydrochlorothiazide (MICROZIDE) 12.5 MG capsule TAKE 1 CAPSULE (12.5 MG TOTAL) BY MOUTH DAILY.  30 capsule  10  . losartan (COZAAR) 100 MG tablet TAKE 1 TABLET (100 MG TOTAL) BY MOUTH DAILY.  30 tablet  10  . simvastatin (ZOCOR) 40 MG tablet Take 1 tablet (40 mg total) by mouth at bedtime.  30 tablet  11  . warfarin (COUMADIN) 5 MG tablet As directed       No current facility-administered medications for this visit.    Past Medical History  Diagnosis Date  . Obesity   . Hypertension   . Heart valve problem     Dr [name]  . Atrial fibrillation     sees Dr. [name]   . Other and unspecified hyperlipidemia   . Other and unspecified hyperlipidemia   . Mitral valve disorders   . OSA (obstructive sleep apnea)     cpap    Past Surgical History  Procedure Laterality Date  . Colonoscopy  03-16-10    per Dr. Russella Dar, benign polyp, repeat in 5 yrs   . No past surgeries    . Lumbar laminectomy/decompression microdiscectomy N/A 11/21/2012   Procedure: Lumbar Three to Lumbar Five Laminectomy;  Surgeon: Karn Cassis, MD;  Location: [facility];  Service: Neurosurgery;  Laterality: N/A;  Lumbar Three to Lumbar Five Laminectomy    Family History  Problem Relation Age of Onset  . Stroke Father   . Coronary artery disease    . Hypertension      History   Social History  . Marital Status: Single    Spouse Name: N/A    Number of Children: N/A  . Years of Education: N/A   Occupational History  . Not on file.   Social History Main Topics  . Smoking status: Never Smoker   . Smokeless tobacco: Never Used  . Alcohol Use: Yes     Comment: once or twice a year  . Drug Use: No  . Sexual Activity: Not on file   Other Topics Concern  . Not on file   Social History Narrative  . No narrative on file    Review of Systems:  All systems reviewed.  They are negative to the above problem except  as previously stated.  Vital Signs: BP 159/88  Pulse 60  Ht 5\' 11"  (1.803 m)  Wt 290 lb 9.6 oz (131.815 kg)  BMI 40.55 kg/m2  Physical Exam Patient is in NAD HEENT:  Normocephalic, atraumatic. EOMI, PERRLA.  Neck: JVP is normal.  No bruits.  Lungs: clear to auscultation. No rales no wheezes.  Heart: Irregular rate and rhythm. Normal S1, S2. No S3.   II/VI sysolic murmur LSB, apex PMI not displaced.  Abdomen:  Supple, nontender. Normal bowel sounds. No masses. No hepatomegaly.  Extremities:   Good distal pulses throughout. No lower extremity edema.  Musculoskeletal :moving all extremities.  Neuro:   alert and oriented x3.  CN II-XII grossly intact.  EKG  Afib.  70 bpm   Assessment and Plan:  1.  Mitral regurgitation.  Follow  Echo last year  Eccentric jet     2.  Atrial fibrillation.  Keep on rate control and coumadin  Has failed cardioversion  3.  HTN  BP is high  Patient is under increased stress.  Back pain/leg pain    Sister died. Has appt later this wk with Dr Clent Ridges.  Recheck  4.  Sleep apnea.  Continues to use  CPAP.

## 2013-02-25 NOTE — Patient Instructions (Signed)
Your physician wants you to follow-up in: ONE YEAR WITH DR ROSS You will receive a reminder letter in the mail two months in advance. If you don't receive a letter, please call our office to schedule the follow-up appointment.  

## 2013-02-27 ENCOUNTER — Ambulatory Visit (INDEPENDENT_AMBULATORY_CARE_PROVIDER_SITE_OTHER): Payer: Managed Care, Other (non HMO) | Admitting: Family Medicine

## 2013-02-27 ENCOUNTER — Encounter: Payer: Self-pay | Admitting: Family Medicine

## 2013-02-27 VITALS — BP 140/82 | HR 94 | Temp 98.2°F | Ht 69.75 in | Wt 287.0 lb

## 2013-02-27 DIAGNOSIS — Z0000 Encounter for general adult medical examination without abnormal findings: Secondary | ICD-10-CM

## 2013-02-27 NOTE — Progress Notes (Signed)
Pre visit review using our clinic review tool, if applicable. No additional management support is needed unless otherwise documented below in the visit note. 

## 2013-02-27 NOTE — Progress Notes (Signed)
   Subjective:    Patient ID: [name], male    DOB: 16-Apr-1950, 62 y.o.   MRN: [id number]  HPI 62 yr old male for a cpx. He feels well although he is still recovering from lumbar spine surgery and is not back to work yet.    Review of Systems  Constitutional: Negative.   HENT: Negative.   Eyes: Negative.   Respiratory: Negative.   Cardiovascular: Negative.   Gastrointestinal: Negative.   Genitourinary: Negative.   Musculoskeletal: Negative.   Skin: Negative.   Neurological: Negative.   Psychiatric/Behavioral: Negative.        Objective:   Physical Exam  Constitutional: He is oriented to person, place, and time. He appears well-developed and well-nourished. No distress.  HENT:  Head: Normocephalic and atraumatic.  Right Ear: External ear normal.  Left Ear: External ear normal.  Nose: Nose normal.  Mouth/Throat: Oropharynx is clear and moist. No oropharyngeal exudate.  Eyes: Conjunctivae and EOM are normal. Pupils are equal, round, and reactive to light. Right eye exhibits no discharge. Left eye exhibits no discharge. No scleral icterus.  Neck: Neck supple. No JVD present. No tracheal deviation present. No thyromegaly present.  Cardiovascular: Normal rate, regular rhythm, normal heart sounds and intact distal pulses.  Exam reveals no gallop and no friction rub.   No murmur heard. Pulmonary/Chest: Effort normal and breath sounds normal. No respiratory distress. He has no wheezes. He has no rales. He exhibits no tenderness.  Abdominal: Soft. Bowel sounds are normal. He exhibits no distension and no mass. There is no tenderness. There is no rebound and no guarding.  Genitourinary: Rectum normal, prostate normal and penis normal. Guaiac negative stool. No penile tenderness.  Musculoskeletal: Normal range of motion. He exhibits no edema and no tenderness.  Lymphadenopathy:    He has no cervical adenopathy.  Neurological: He is alert and oriented to person, place, and time. He has  normal reflexes. No cranial nerve deficit. He exhibits normal muscle tone. Coordination normal.  Skin: Skin is warm and dry. No rash noted. He is not diaphoretic. No erythema. No pallor.  Psychiatric: He has a normal mood and affect. His behavior is normal. Judgment and thought content normal.          Assessment & Plan:  Well exam.

## 2013-03-01 ENCOUNTER — Encounter: Payer: Managed Care, Other (non HMO) | Admitting: Family Medicine

## 2013-03-11 ENCOUNTER — Ambulatory Visit: Payer: Managed Care, Other (non HMO) | Admitting: Internal Medicine

## 2013-03-11 ENCOUNTER — Ambulatory Visit (INDEPENDENT_AMBULATORY_CARE_PROVIDER_SITE_OTHER): Payer: Managed Care, Other (non HMO) | Admitting: *Deleted

## 2013-03-11 DIAGNOSIS — I4891 Unspecified atrial fibrillation: Secondary | ICD-10-CM

## 2013-03-11 DIAGNOSIS — Z7901 Long term (current) use of anticoagulants: Secondary | ICD-10-CM

## 2013-03-11 LAB — POCT INR: INR: 2.4

## 2013-03-20 ENCOUNTER — Other Ambulatory Visit: Payer: Self-pay | Admitting: Internal Medicine

## 2013-03-21 ENCOUNTER — Other Ambulatory Visit: Payer: Self-pay | Admitting: Internal Medicine

## 2013-04-25 ENCOUNTER — Ambulatory Visit (INDEPENDENT_AMBULATORY_CARE_PROVIDER_SITE_OTHER): Payer: Managed Care, Other (non HMO) | Admitting: Pharmacist

## 2013-04-25 DIAGNOSIS — I4891 Unspecified atrial fibrillation: Secondary | ICD-10-CM

## 2013-04-25 DIAGNOSIS — Z7901 Long term (current) use of anticoagulants: Secondary | ICD-10-CM

## 2013-04-25 LAB — POCT INR: INR: 2.8

## 2013-05-07 ENCOUNTER — Other Ambulatory Visit: Payer: Self-pay | Admitting: Internal Medicine

## 2013-06-03 ENCOUNTER — Ambulatory Visit (INDEPENDENT_AMBULATORY_CARE_PROVIDER_SITE_OTHER): Payer: Managed Care, Other (non HMO) | Admitting: *Deleted

## 2013-06-03 DIAGNOSIS — Z5181 Encounter for therapeutic drug level monitoring: Secondary | ICD-10-CM

## 2013-06-03 DIAGNOSIS — I4891 Unspecified atrial fibrillation: Secondary | ICD-10-CM

## 2013-06-03 DIAGNOSIS — Z7901 Long term (current) use of anticoagulants: Secondary | ICD-10-CM

## 2013-06-03 DIAGNOSIS — J209 Acute bronchitis, unspecified: Secondary | ICD-10-CM

## 2013-06-03 LAB — POCT INR: INR: 2.7

## 2013-06-24 ENCOUNTER — Ambulatory Visit: Payer: Managed Care, Other (non HMO) | Admitting: Pulmonary Disease

## 2013-07-03 ENCOUNTER — Ambulatory Visit (INDEPENDENT_AMBULATORY_CARE_PROVIDER_SITE_OTHER): Payer: Managed Care, Other (non HMO) | Admitting: Pulmonary Disease

## 2013-07-03 ENCOUNTER — Encounter: Payer: Self-pay | Admitting: Pulmonary Disease

## 2013-07-03 VITALS — BP 130/82 | HR 63 | Temp 98.2°F | Ht 71.0 in | Wt 293.0 lb

## 2013-07-03 DIAGNOSIS — G4733 Obstructive sleep apnea (adult) (pediatric): Secondary | ICD-10-CM

## 2013-07-03 NOTE — Progress Notes (Signed)
   Subjective:    Patient ID: [name], male    DOB: November 05, 1950, 63 y.o.   MRN: [id number]  HPI Patient comes in today for followup of his known obstructive sleep apnea. He is wearing CPAP compliantly at this time, and feels that it is helping his sleep and daytime alertness. He had back surgery last year, and could not wear the device for a few months, but has been back on it for some time now. He is satisfied with his daytime alertness. Of note, the patient's weight is increased since last visit, but is actually down since his back surgery.   Review of Systems  Constitutional: Negative for fever and unexpected weight change.  HENT: Negative for congestion, dental problem, ear pain, nosebleeds, postnasal drip, rhinorrhea, sinus pressure, sneezing, sore throat and trouble swallowing.   Eyes: Negative for redness and itching.  Respiratory: Negative for cough, chest tightness, shortness of breath and wheezing.   Cardiovascular: Negative for palpitations and leg swelling.  Gastrointestinal: Negative for nausea and vomiting.  Genitourinary: Negative for dysuria.  Musculoskeletal: Negative for joint swelling.  Skin: Negative for rash.  Neurological: Negative for headaches.  Hematological: Does not bruise/bleed easily.  Psychiatric/Behavioral: Negative for dysphoric mood. The patient is not nervous/anxious.        Objective:   Physical Exam Obese male in no acute distress Nose without purulence or discharge noted No skin breakdown or pressure necrosis from the CPAP mask Neck without lymphadenopathy or thyromegaly Lower extremities with mild edema, no cyanosis Alert and oriented, moves all 4 extremities.       Assessment & Plan:

## 2013-07-03 NOTE — Patient Instructions (Signed)
Continue with cpap, and keep up with mask changes and supplies. Work on weight loss.  followup with me again in 1year.

## 2013-07-03 NOTE — Assessment & Plan Note (Signed)
Patient is doing well with CPAP overall, but I've encouraged him to work aggressively on weight loss. I've also asked him to keep up with his mask changes and supplies, and to followup with me again in one year.

## 2013-07-17 ENCOUNTER — Ambulatory Visit (INDEPENDENT_AMBULATORY_CARE_PROVIDER_SITE_OTHER): Payer: Managed Care, Other (non HMO) | Admitting: *Deleted

## 2013-07-17 DIAGNOSIS — Z7901 Long term (current) use of anticoagulants: Secondary | ICD-10-CM

## 2013-07-17 DIAGNOSIS — I4891 Unspecified atrial fibrillation: Secondary | ICD-10-CM

## 2013-07-17 DIAGNOSIS — J209 Acute bronchitis, unspecified: Secondary | ICD-10-CM

## 2013-07-17 DIAGNOSIS — Z5181 Encounter for therapeutic drug level monitoring: Secondary | ICD-10-CM

## 2013-07-17 LAB — POCT INR: INR: 3.1

## 2013-07-18 ENCOUNTER — Ambulatory Visit (INDEPENDENT_AMBULATORY_CARE_PROVIDER_SITE_OTHER): Payer: Managed Care, Other (non HMO) | Admitting: Family Medicine

## 2013-07-18 ENCOUNTER — Encounter: Payer: Self-pay | Admitting: Family Medicine

## 2013-07-18 VITALS — BP 147/89 | HR 51 | Temp 98.5°F | Ht 71.0 in | Wt 289.0 lb

## 2013-07-18 DIAGNOSIS — I10 Essential (primary) hypertension: Secondary | ICD-10-CM

## 2013-07-18 DIAGNOSIS — IMO0001 Reserved for inherently not codable concepts without codable children: Secondary | ICD-10-CM

## 2013-07-18 DIAGNOSIS — E785 Hyperlipidemia, unspecified: Secondary | ICD-10-CM

## 2013-07-18 LAB — CBC WITH DIFFERENTIAL/PLATELET
BASOS PCT: 0.5 % (ref 0.0–3.0)
Basophils Absolute: 0 10*3/uL (ref 0.0–0.1)
EOS PCT: 2 % (ref 0.0–5.0)
Eosinophils Absolute: 0.2 10*3/uL (ref 0.0–0.7)
HEMATOCRIT: 41.4 % (ref 39.0–52.0)
HEMOGLOBIN: 13.9 g/dL (ref 13.0–17.0)
LYMPHS ABS: 1.8 10*3/uL (ref 0.7–4.0)
Lymphocytes Relative: 23.8 % (ref 12.0–46.0)
MCHC: 33.7 g/dL (ref 30.0–36.0)
MCV: 95.1 fl (ref 78.0–100.0)
MONO ABS: 0.6 10*3/uL (ref 0.1–1.0)
Monocytes Relative: 8 % (ref 3.0–12.0)
NEUTROS ABS: 5.1 10*3/uL (ref 1.4–7.7)
NEUTROS PCT: 65.7 % (ref 43.0–77.0)
Platelets: 205 10*3/uL (ref 150.0–400.0)
RBC: 4.35 Mil/uL (ref 4.22–5.81)
RDW: 14.2 % (ref 11.5–15.5)
WBC: 7.8 10*3/uL (ref 4.0–10.5)

## 2013-07-18 LAB — BASIC METABOLIC PANEL
BUN: 23 mg/dL (ref 6–23)
CALCIUM: 11 mg/dL — AB (ref 8.4–10.5)
CO2: 26 mEq/L (ref 19–32)
CREATININE: 0.9 mg/dL (ref 0.4–1.5)
Chloride: 106 mEq/L (ref 96–112)
GFR: 87.07 mL/min (ref >60.00)
Glucose, Bld: 93 mg/dL (ref 70–99)
Potassium: 3.8 mEq/L (ref 3.5–5.1)
Sodium: 138 mEq/L (ref 135–145)

## 2013-07-18 LAB — HEPATIC FUNCTION PANEL
ALT: 26 U/L (ref 0–53)
AST: 28 U/L (ref 0–37)
Albumin: 4.1 g/dL (ref 3.5–5.2)
Alkaline Phosphatase: 79 U/L (ref 39–117)
BILIRUBIN TOTAL: 0.5 mg/dL (ref 0.2–1.2)
Bilirubin, Direct: 0.1 mg/dL (ref 0.0–0.3)
Total Protein: 7 g/dL (ref 6.0–8.3)

## 2013-07-18 LAB — CK: Total CK: 214 U/L (ref 7–232)

## 2013-07-18 LAB — TSH: TSH: 0.46 u[IU]/mL (ref 0.35–4.50)

## 2013-07-18 MED ORDER — HYDROCHLOROTHIAZIDE 25 MG PO TABS: 25.0000 mg | ORAL_TABLET | Freq: Every day | ORAL | Status: DC

## 2013-07-18 NOTE — Progress Notes (Signed)
Pre visit review using our clinic review tool, if applicable. No additional management support is needed unless otherwise documented below in the visit note. 

## 2013-07-18 NOTE — Progress Notes (Signed)
   Subjective:    Patient ID: [name], male    DOB: [birth year], 63 y.o.   MRN: [id number]  HPI Here for advice. First he has had aching pains in both thighs for several months and he thinks this is due to the statin he is taking. He had similar pains form taking Lipitor in the past. Also his ankles have been puffy. No SOB.   Review of Systems  Constitutional: Negative.   Respiratory: Negative.   Cardiovascular: Positive for leg swelling. Negative for chest pain and palpitations.  Musculoskeletal: Positive for myalgias.       Objective:   Physical Exam  Constitutional: He appears well-developed and well-nourished.  Cardiovascular: Normal rate, regular rhythm, normal heart sounds and intact distal pulses.   Pulmonary/Chest: Effort normal and breath sounds normal.  Musculoskeletal:  2+ edema in both lower legs           Assessment & Plan:  His pains are probably from the Zocor so he will stop this for now. Get labs today. Increase the HCTZ to 25 mg daily.

## 2013-07-19 ENCOUNTER — Telehealth: Payer: Self-pay | Admitting: Family Medicine

## 2013-07-19 LAB — VITAMIN D 25 HYDROXY (VIT D DEFICIENCY, FRACTURES): Vit D, 25-Hydroxy: <10 ng/mL — ABNORMAL LOW (ref 30–89)

## 2013-07-19 NOTE — Telephone Encounter (Signed)
Relevant patient education assigned to patient using Emmi. ° °

## 2013-07-22 MED ORDER — VITAMIN D (ERGOCALCIFEROL) 1.25 MG (50000 UNIT) PO CAPS: 50000.0000 [IU] | ORAL_CAPSULE | ORAL | Status: DC

## 2013-07-22 NOTE — Addendum Note (Signed)
Addended by: Aggie Hacker A on: 07/22/2013 02:44 PM   Modules accepted: Orders

## 2013-08-28 ENCOUNTER — Ambulatory Visit (INDEPENDENT_AMBULATORY_CARE_PROVIDER_SITE_OTHER): Payer: Managed Care, Other (non HMO) | Admitting: *Deleted

## 2013-08-28 DIAGNOSIS — I4891 Unspecified atrial fibrillation: Secondary | ICD-10-CM

## 2013-08-28 DIAGNOSIS — Z5181 Encounter for therapeutic drug level monitoring: Secondary | ICD-10-CM

## 2013-08-28 DIAGNOSIS — Z7901 Long term (current) use of anticoagulants: Secondary | ICD-10-CM

## 2013-08-28 LAB — POCT INR: INR: 2.3

## 2013-09-03 ENCOUNTER — Other Ambulatory Visit: Payer: Self-pay | Admitting: Internal Medicine

## 2013-10-06 ENCOUNTER — Other Ambulatory Visit: Payer: Self-pay | Admitting: Family Medicine

## 2013-10-09 ENCOUNTER — Ambulatory Visit (INDEPENDENT_AMBULATORY_CARE_PROVIDER_SITE_OTHER): Payer: No Typology Code available for payment source | Admitting: *Deleted

## 2013-10-09 DIAGNOSIS — Z5181 Encounter for therapeutic drug level monitoring: Secondary | ICD-10-CM

## 2013-10-09 DIAGNOSIS — Z7901 Long term (current) use of anticoagulants: Secondary | ICD-10-CM

## 2013-10-09 DIAGNOSIS — I4891 Unspecified atrial fibrillation: Secondary | ICD-10-CM

## 2013-10-09 LAB — POCT INR: INR: 2.7

## 2013-10-11 ENCOUNTER — Telehealth: Payer: Self-pay | Admitting: Family Medicine

## 2013-10-11 DIAGNOSIS — E559 Vitamin D deficiency, unspecified: Secondary | ICD-10-CM | POA: Insufficient documentation

## 2013-10-11 NOTE — Telephone Encounter (Signed)
I put the order in

## 2013-10-11 NOTE — Telephone Encounter (Signed)
Pt would like to have vit d recheck. Pt was taking vit d. Can I sch?

## 2013-10-12 ENCOUNTER — Other Ambulatory Visit: Payer: Self-pay | Admitting: Family Medicine

## 2013-10-14 ENCOUNTER — Other Ambulatory Visit (INDEPENDENT_AMBULATORY_CARE_PROVIDER_SITE_OTHER): Payer: No Typology Code available for payment source

## 2013-10-14 DIAGNOSIS — E559 Vitamin D deficiency, unspecified: Secondary | ICD-10-CM

## 2013-10-14 LAB — VITAMIN D 25 HYDROXY (VIT D DEFICIENCY, FRACTURES): VITD: 20.47 ng/mL — ABNORMAL LOW (ref 30.00–100.00)

## 2013-10-14 NOTE — Telephone Encounter (Signed)
Pt has been sch

## 2013-10-17 ENCOUNTER — Ambulatory Visit (INDEPENDENT_AMBULATORY_CARE_PROVIDER_SITE_OTHER): Payer: No Typology Code available for payment source | Admitting: Family Medicine

## 2013-10-17 ENCOUNTER — Encounter: Payer: Self-pay | Admitting: Family Medicine

## 2013-10-17 VITALS — BP 148/80 | Temp 98.6°F | Ht 71.0 in | Wt 288.0 lb

## 2013-10-17 DIAGNOSIS — IMO0001 Reserved for inherently not codable concepts without codable children: Secondary | ICD-10-CM

## 2013-10-17 DIAGNOSIS — E669 Obesity, unspecified: Secondary | ICD-10-CM

## 2013-10-17 DIAGNOSIS — I4891 Unspecified atrial fibrillation: Secondary | ICD-10-CM

## 2013-10-17 DIAGNOSIS — M25559 Pain in unspecified hip: Secondary | ICD-10-CM

## 2013-10-17 DIAGNOSIS — M25551 Pain in right hip: Secondary | ICD-10-CM | POA: Insufficient documentation

## 2013-10-17 DIAGNOSIS — E559 Vitamin D deficiency, unspecified: Secondary | ICD-10-CM

## 2013-10-17 NOTE — Progress Notes (Signed)
Pre visit review using our clinic review tool, if applicable. No additional management support is needed unless otherwise documented below in the visit note. 

## 2013-10-17 NOTE — Progress Notes (Signed)
   Subjective:    Patient ID: [name], male    DOB: 11-10-1950, 63 y.o.   MRN: [id number]  HPI Here to follow up muscle aches and right hip pain. At our last visit he described generalized dull aching of both legs and we stopped his Zocor to see if this helped. In fact, this aching has improved quite a bit. We also found his vitamin D level to be quite low so he took prescription vitamin D for 12 weeks. This got the level up to 20, and he feels better. We have now switched him to OTC vitamin D. Because of his multiple pains and his fatigue he decided to retire, and he has enjoyed his free time for the past several months. He feels better now that he is not on his feet all day. His chief complaint today is constant pain in the right hip area. He has a lot of pain in the right anterior hip area when he puts weight on it, and the pains radiate around to the lateral hip.    Review of Systems  Constitutional: Negative.   Respiratory: Negative.   Cardiovascular: Negative.   Musculoskeletal: Positive for arthralgias. Negative for myalgias.       Objective:   Physical Exam  Constitutional: He appears well-developed and well-nourished.  Cardiovascular: Normal rate, regular rhythm, normal heart sounds and intact distal pulses.   Pulmonary/Chest: Effort normal and breath sounds normal.  Musculoskeletal: Normal range of motion. He exhibits no edema and no tenderness.          Assessment & Plan:  We will refer him to Orthopedics for the hip pain.

## 2013-11-20 ENCOUNTER — Ambulatory Visit (INDEPENDENT_AMBULATORY_CARE_PROVIDER_SITE_OTHER): Payer: No Typology Code available for payment source | Admitting: Pharmacist

## 2013-11-20 DIAGNOSIS — Z5181 Encounter for therapeutic drug level monitoring: Secondary | ICD-10-CM

## 2013-11-20 DIAGNOSIS — Z7901 Long term (current) use of anticoagulants: Secondary | ICD-10-CM

## 2013-11-20 DIAGNOSIS — I4891 Unspecified atrial fibrillation: Secondary | ICD-10-CM

## 2013-11-20 LAB — POCT INR: INR: 2.7

## 2013-12-04 ENCOUNTER — Other Ambulatory Visit: Payer: Self-pay | Admitting: Family Medicine

## 2013-12-09 IMAGING — CR DG CHEST 2V
3 series · 3 of 3 positions shown · non-contrast
Comparison: None.

CLINICAL DATA: Preoperative evaluation for back surgery

CHEST - 2 VIEW

[w chest pa]
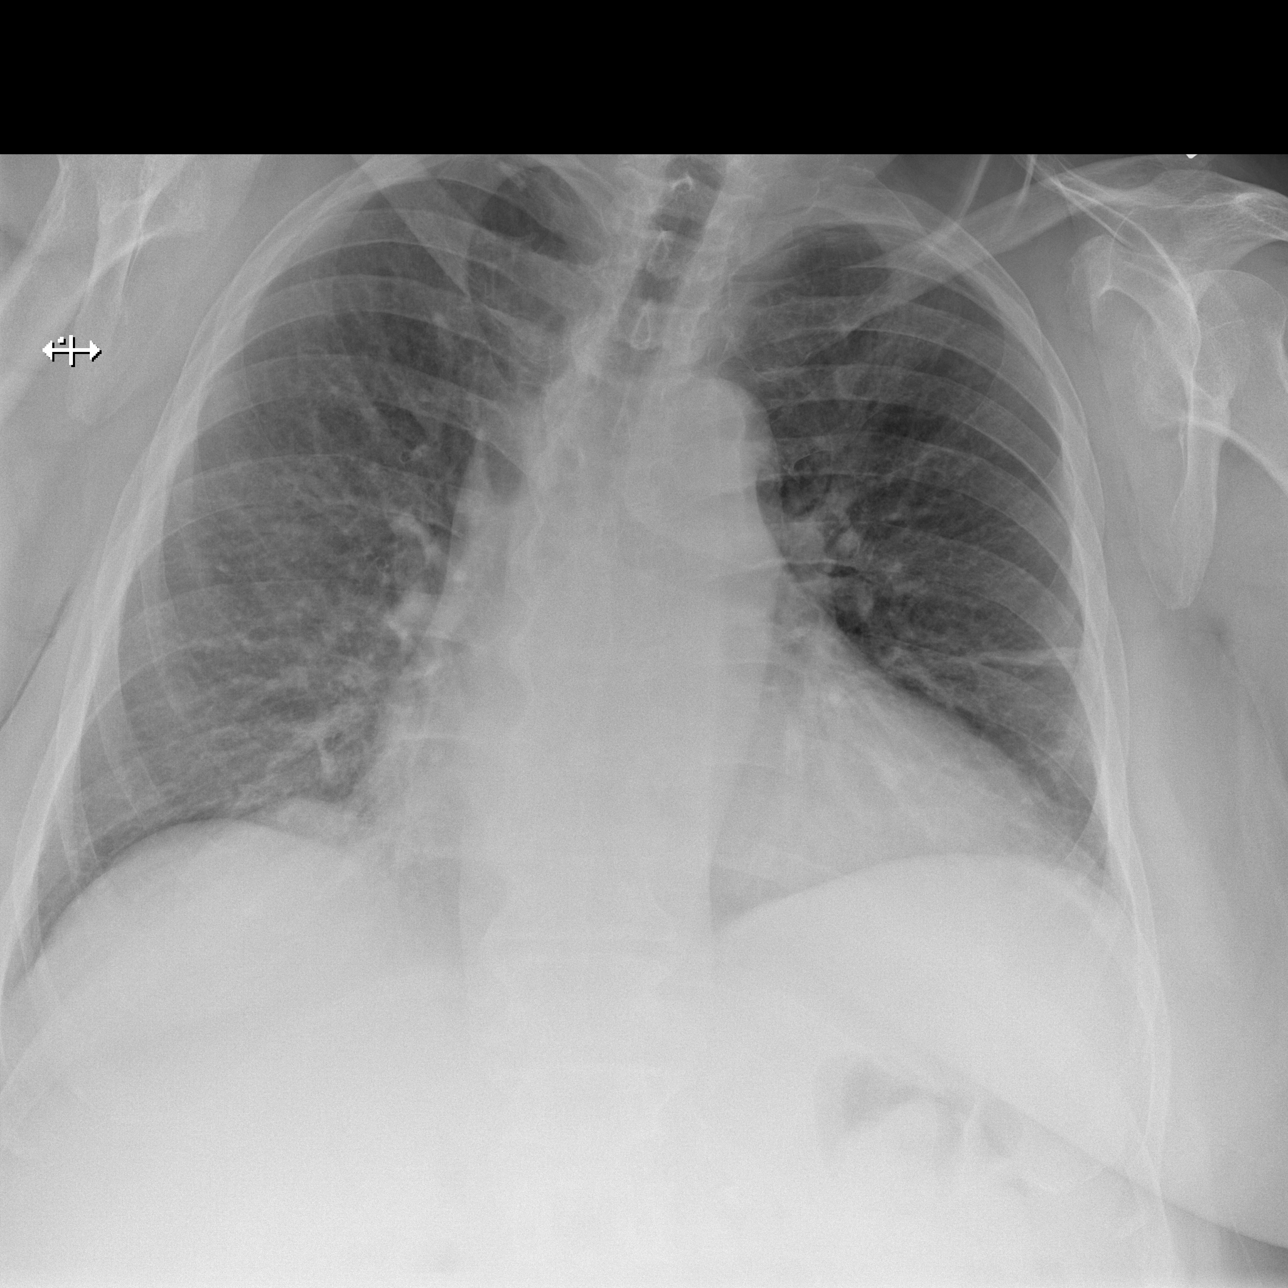

[w chest lat (1 of 2)]
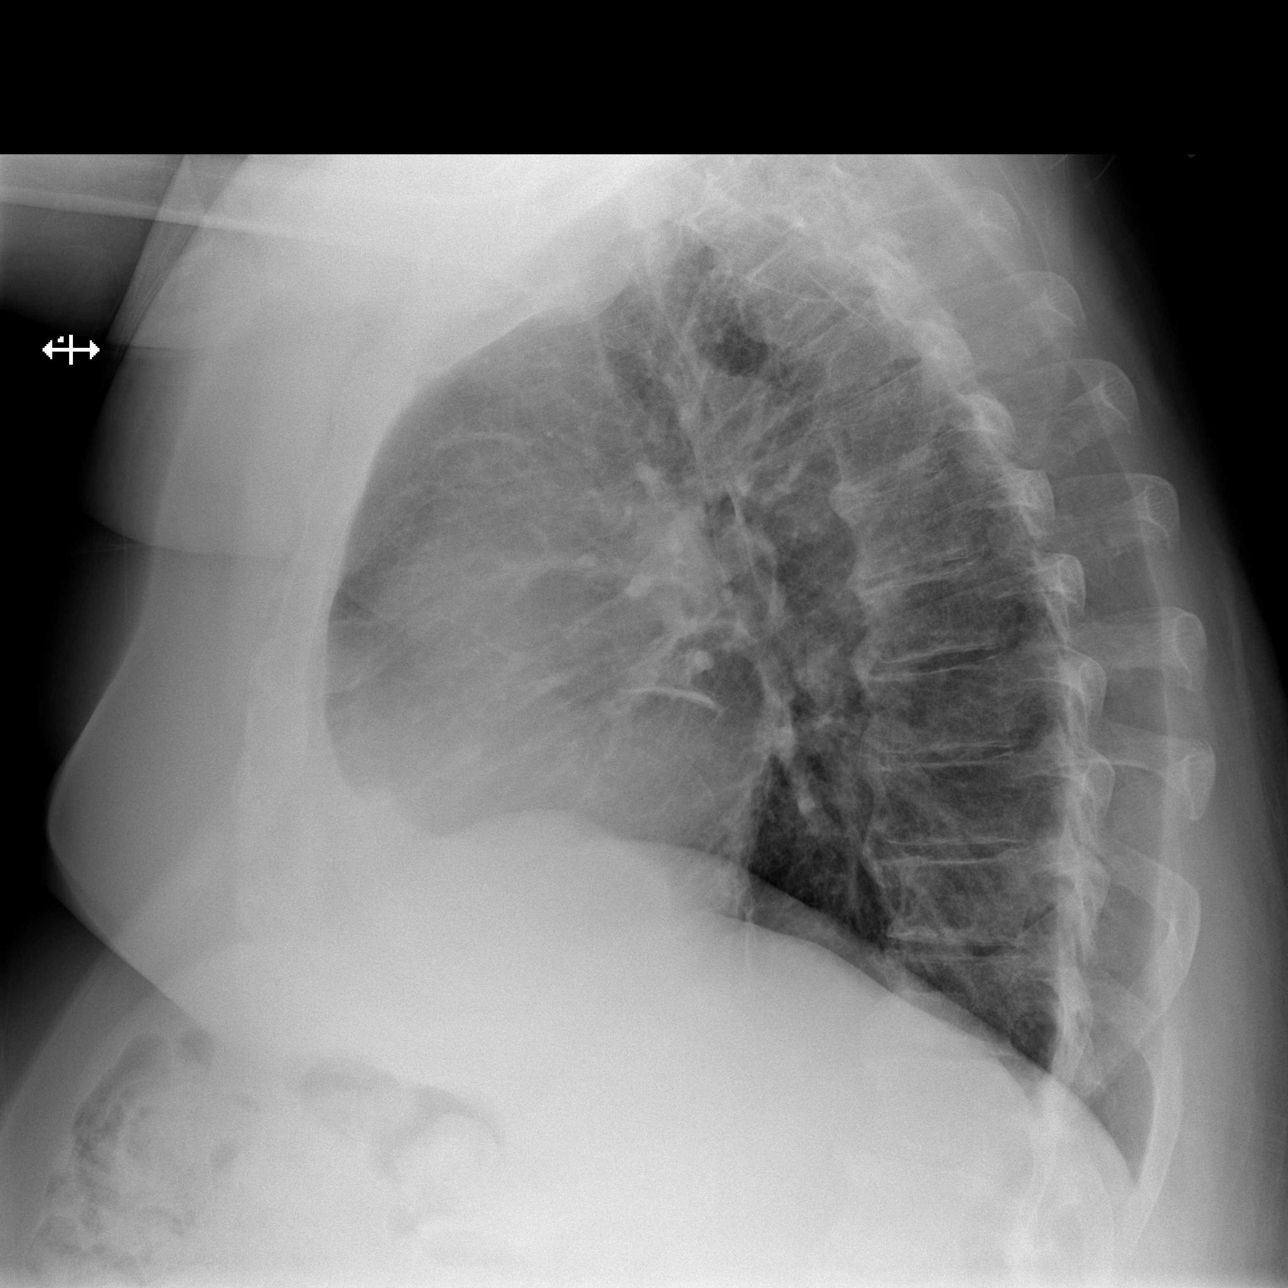

[w chest lat (2 of 2)]
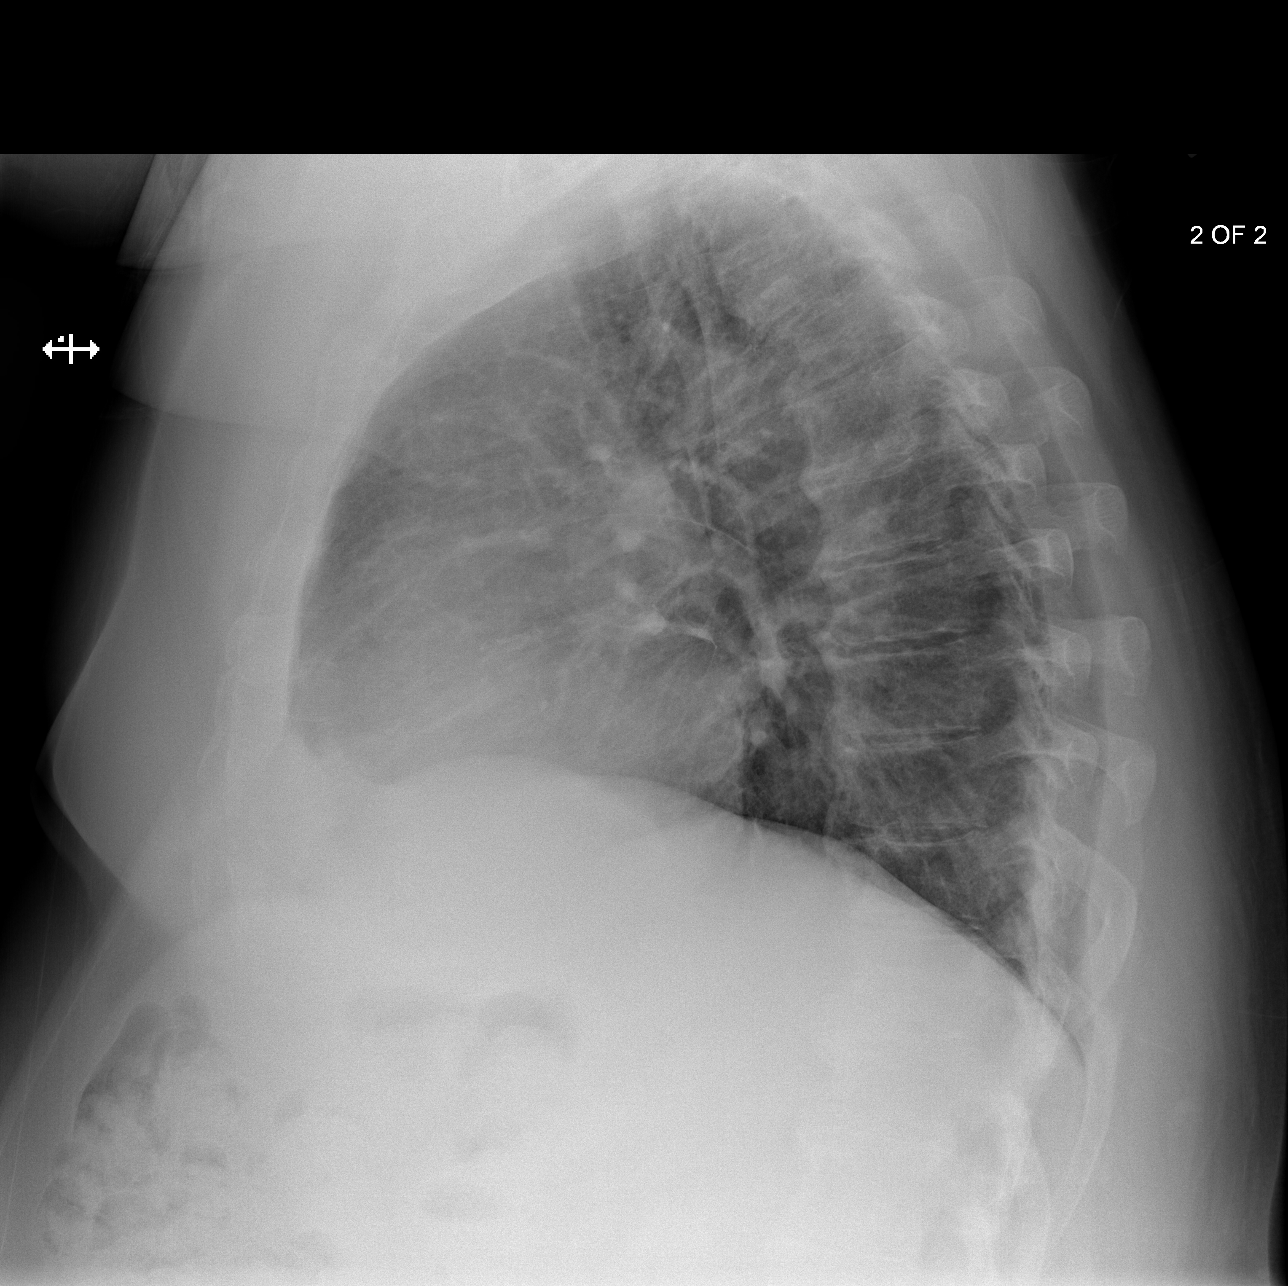

[3 of 3 positions shown; findings below may reference images not displayed]

FINDINGS: The cardiac shadow is mildly enlarged.  The lungs are
well-aerated bilaterally.  Linear atelectasis is noted in the left
mid lung.  No focal infiltrate is seen.  No acute bony abnormality
is noted.
IMPRESSION: Left midlung atelectasis.  No other focal abnormality is seen.

## 2013-12-16 ENCOUNTER — Encounter: Payer: Self-pay | Admitting: Internal Medicine

## 2013-12-17 IMAGING — CR DG LUMBAR SPINE 1V
1 series · 1 of 1 positions shown · non-contrast
Comparison: 09/10/2012

CLINICAL DATA: Lumbar laminectomy, decompression.

EXAM:
LUMBAR SPINE - 1 VIEW

[lateral]
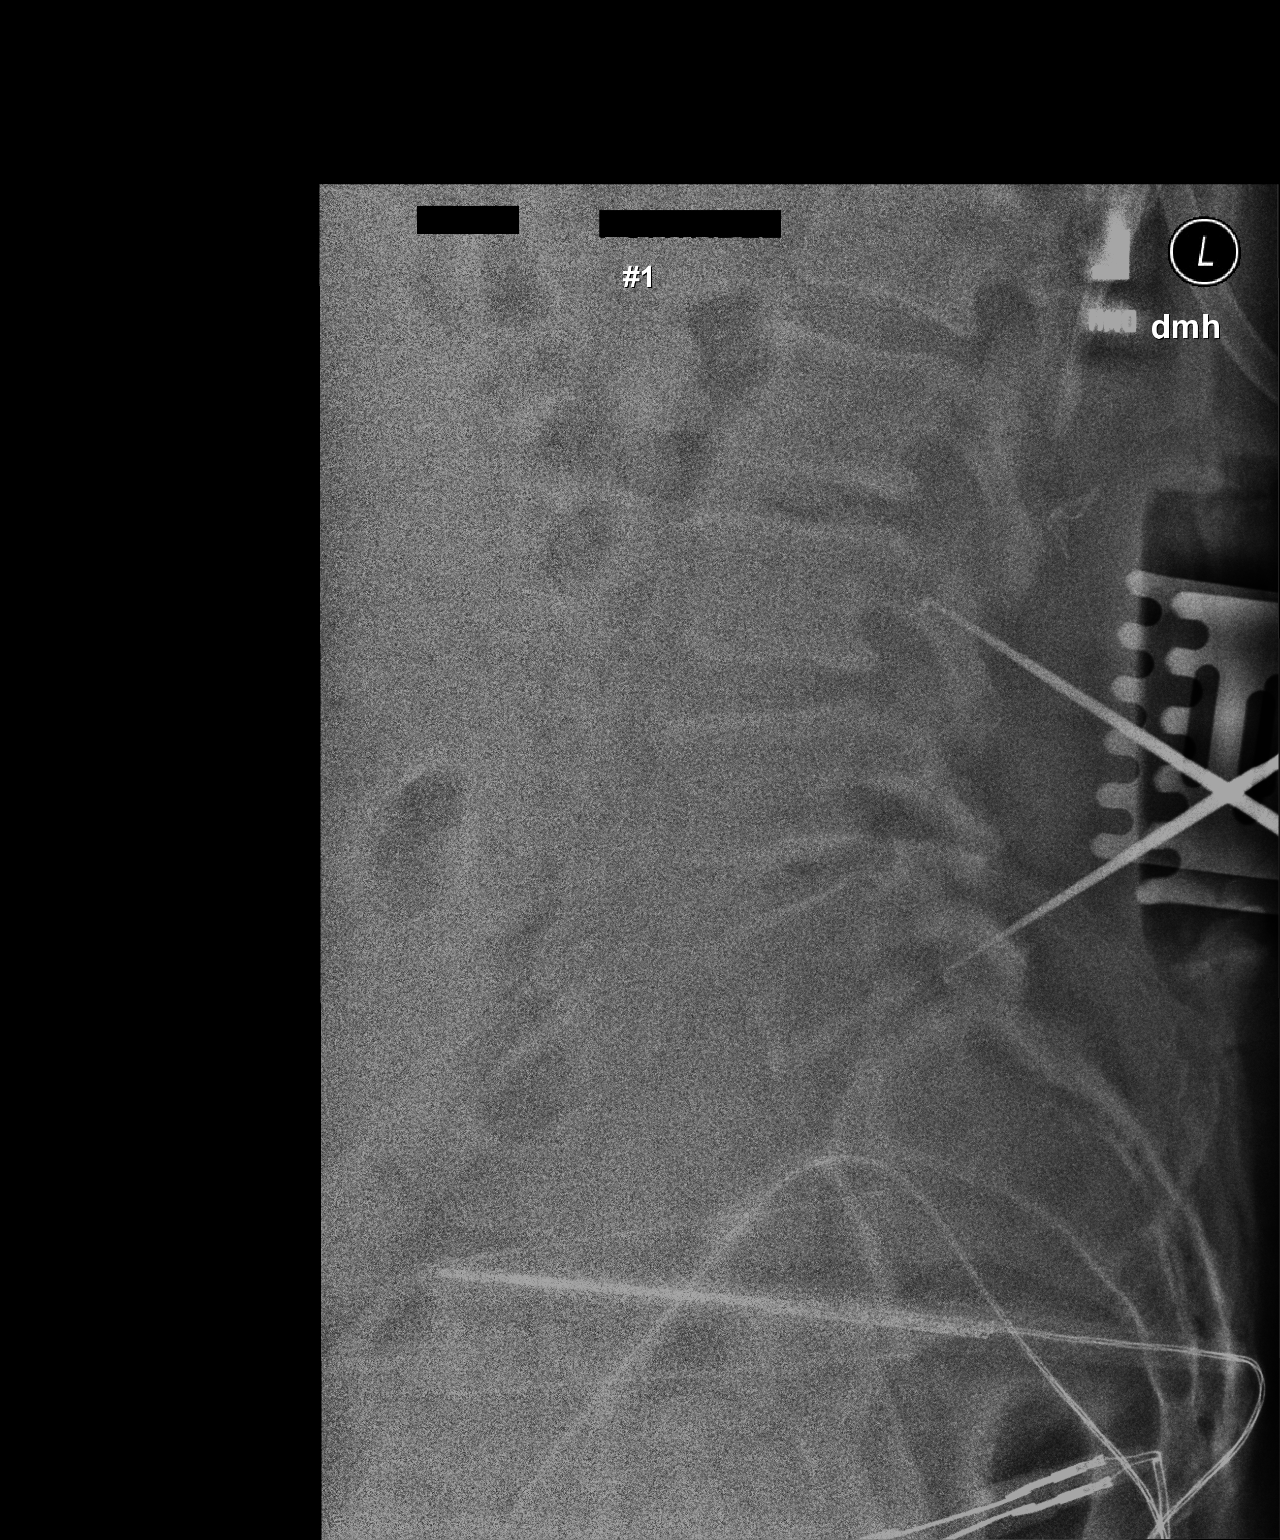

[1 of 1 positions shown; findings below may reference images not displayed]

FINDINGS: Posterior surgical instruments are directed at the L3 pedicle and
L5-S1 disc space.
IMPRESSION: Intraoperative localization as above.

## 2014-01-01 ENCOUNTER — Ambulatory Visit (INDEPENDENT_AMBULATORY_CARE_PROVIDER_SITE_OTHER): Payer: No Typology Code available for payment source | Admitting: Pharmacist

## 2014-01-01 DIAGNOSIS — I4891 Unspecified atrial fibrillation: Secondary | ICD-10-CM

## 2014-01-01 DIAGNOSIS — Z7901 Long term (current) use of anticoagulants: Secondary | ICD-10-CM

## 2014-01-01 DIAGNOSIS — Z5181 Encounter for therapeutic drug level monitoring: Secondary | ICD-10-CM

## 2014-01-01 LAB — POCT INR: INR: 2.8

## 2014-01-04 ENCOUNTER — Other Ambulatory Visit: Payer: Self-pay | Admitting: Family Medicine

## 2014-01-04 ENCOUNTER — Other Ambulatory Visit: Payer: Self-pay | Admitting: Internal Medicine

## 2014-01-06 ENCOUNTER — Other Ambulatory Visit: Payer: Self-pay | Admitting: *Deleted

## 2014-01-08 ENCOUNTER — Other Ambulatory Visit: Payer: Self-pay | Admitting: *Deleted

## 2014-01-08 MED ORDER — WARFARIN SODIUM 5 MG PO TABS: 5.0000 mg | ORAL_TABLET | ORAL | Status: DC

## 2014-02-01 ENCOUNTER — Other Ambulatory Visit: Payer: Self-pay | Admitting: Internal Medicine

## 2014-02-12 ENCOUNTER — Ambulatory Visit (INDEPENDENT_AMBULATORY_CARE_PROVIDER_SITE_OTHER): Payer: No Typology Code available for payment source | Admitting: *Deleted

## 2014-02-12 DIAGNOSIS — Z7901 Long term (current) use of anticoagulants: Secondary | ICD-10-CM

## 2014-02-12 DIAGNOSIS — Z5181 Encounter for therapeutic drug level monitoring: Secondary | ICD-10-CM

## 2014-02-12 DIAGNOSIS — I4891 Unspecified atrial fibrillation: Secondary | ICD-10-CM

## 2014-02-12 LAB — POCT INR: INR: 2.7

## 2014-02-21 ENCOUNTER — Encounter: Payer: Self-pay | Admitting: Internal Medicine

## 2014-02-21 ENCOUNTER — Ambulatory Visit (INDEPENDENT_AMBULATORY_CARE_PROVIDER_SITE_OTHER): Payer: No Typology Code available for payment source | Admitting: Internal Medicine

## 2014-02-21 VITALS — BP 150/100 | HR 91 | Ht 71.0 in | Wt 299.0 lb

## 2014-02-21 DIAGNOSIS — I059 Rheumatic mitral valve disease, unspecified: Secondary | ICD-10-CM

## 2014-02-21 DIAGNOSIS — I340 Nonrheumatic mitral (valve) insufficiency: Secondary | ICD-10-CM

## 2014-02-21 DIAGNOSIS — I10 Essential (primary) hypertension: Secondary | ICD-10-CM

## 2014-02-21 MED ORDER — CLONIDINE HCL 0.2 MG PO TABS: 0.2000 mg | ORAL_TABLET | Freq: Two times a day (BID) | ORAL | Status: DC

## 2014-02-21 NOTE — Patient Instructions (Signed)
Your physician has recommended you make the following change in your medication:  1.) increase clonidine to 0.2 mg twice daily  Your physician has requested that you have an echocardiogram. Echocardiography is a painless test that uses sound waves to create images of your heart. It provides your doctor with information about the size and shape of your heart and how well your heart's chambers and valves are working. This procedure takes approximately one hour. There are no restrictions for this procedure.  Your physician wants you to follow-up in: September 2016 WITH DR ROSS.  You will receive a reminder letter in the mail two months in advance. If you don't receive a letter, please call our office to schedule the follow-up appointment.

## 2014-02-21 NOTE — Progress Notes (Signed)
HPI Patient is a 63 yo with a history of mitral regurgitation, HTN, afib, sleep apnea, morbid obesity  .  I saw him in Dec 2014 He says that this year has been a very bad one for him  Has had chronic pain in his legs  Was on disability Breathing is OK  No CP  No PND   Knows he has to lose wt  Does not do much     Allergies  Allergen Reactions  . Penicillins     REACTION: unspecified    Current Outpatient Prescriptions  Medication Sig Dispense Refill  . amLODipine (NORVASC) 10 MG tablet TAKE 1 TABLET (10 MG TOTAL) BY MOUTH DAILY. 30 tablet 1  . aspirin 81 MG tablet Take 81 mg by mouth daily.      . carvedilol (COREG) 25 MG tablet TAKE 1 TABLET (25 MG TOTAL) BY MOUTH 2 (TWO) TIMES DAILY. 60 tablet 3  . cloNIDine (CATAPRES) 0.1 MG tablet TAKE 1 TABLET (0.1 MG TOTAL) BY MOUTH 2 (TWO) TIMES DAILY. 60 tablet 3  . docusate sodium (COLACE) 100 MG capsule Take 100 mg by mouth 3 (three) times daily as needed.     . fish oil-omega-3 fatty acids 1000 MG capsule Take 1 g by mouth daily.      . hydrochlorothiazide (HYDRODIURIL) 25 MG tablet Take 1 tablet (25 mg total) by mouth daily. 90 tablet 3  . losartan (COZAAR) 100 MG tablet TAKE 1 TABLET (100 MG TOTAL) BY MOUTH DAILY. 30 tablet 9  . VITAMIN D, CHOLECALCIFEROL, PO Take 5,000 Units by mouth daily.    Marland Kitchen warfarin (COUMADIN) 5 MG tablet Take 1 tablet (5 mg total) by mouth as directed. 60 tablet 3   No current facility-administered medications for this visit.    Past Medical History  Diagnosis Date  . Obesity   . Hypertension   . Heart valve problem     Dr [name]  . Atrial fibrillation     sees Dr. [name]   . Other and unspecified hyperlipidemia   . Other and unspecified hyperlipidemia   . Mitral valve disorders   . OSA (obstructive sleep apnea)     cpap    Past Surgical History  Procedure Laterality Date  . Colonoscopy  03-16-10    per Dr. Fuller Plan, benign polyp, repeat in 5 yrs   . No past surgeries    . Lumbar  laminectomy/decompression microdiscectomy N/A 11/21/2012    Procedure: Lumbar Three to Lumbar Five Laminectomy;  Surgeon: Floyce Stakes, MD;  Location: [facility];  Service: Neurosurgery;  Laterality: N/A;  Lumbar Three to Lumbar Five Laminectomy    Family History  Problem Relation Age of Onset  . Stroke Father   . Coronary artery disease    . Hypertension      History   Social History  . Marital Status: Single    Spouse Name: N/A    Number of Children: N/A  . Years of Education: N/A   Occupational History  . Not on file.   Social History Main Topics  . Smoking status: Never Smoker   . Smokeless tobacco: Never Used  . Alcohol Use: No  . Drug Use: No  . Sexual Activity: Not on file   Other Topics Concern  . Not on file   Social History Narrative    Review of Systems:  All systems reviewed.  They are negative to the above problem except as previously stated.  Vital Signs: BP 150/100  mmHg  Pulse 91  Ht 5\' 11"  (1.803 m)  Wt 299 lb (135.626 kg)  BMI 41.72 kg/m2  Physical Exam Patient is in NAD HEENT:  Normocephalic, atraumatic. EOMI, PERRLA.  Neck: JVP is normal.  No bruits.  Lungs: clear to auscultation. No rales no wheezes.  Heart: Irregular rate and rhythm. Normal S1, S2. No S3.   No signif murmurs  , apex PMI not displaced.  Abdomen:  Supple, nontender. Normal bowel sounds. No masses. No hepatomegaly.  Extremities:   Good distal pulses throughout. No lower extremity edema.  Musculoskeletal :moving all extremities.  Neuro:   alert and oriented x3.  CN II-XII grossly intact.  EKG  Afib.  91 bpm  Poor R wave progression   Assessment and Plan:  1.  Mitral regurgitation.  Exam is difficult with size  Should have echo    2.  Atrial fibrillation.  Keep on rate control and coumadin  Has failed cardioversion  3.  HTN  BP is high  Patient is under increased stress.  Back pain/leg pain    I would increase his clonidine to 0.2 mg bid  4.  Sleep apnea.  Says he  is using CPAP  Difficult

## 2014-02-26 ENCOUNTER — Other Ambulatory Visit (HOSPITAL_COMMUNITY): Payer: No Typology Code available for payment source

## 2014-02-26 ENCOUNTER — Ambulatory Visit (HOSPITAL_COMMUNITY): Payer: No Typology Code available for payment source | Attending: Cardiovascular Disease | Admitting: Radiology

## 2014-02-26 DIAGNOSIS — I340 Nonrheumatic mitral (valve) insufficiency: Secondary | ICD-10-CM | POA: Diagnosis not present

## 2014-02-26 DIAGNOSIS — I10 Essential (primary) hypertension: Secondary | ICD-10-CM | POA: Insufficient documentation

## 2014-02-26 DIAGNOSIS — G4733 Obstructive sleep apnea (adult) (pediatric): Secondary | ICD-10-CM | POA: Diagnosis not present

## 2014-02-26 MED ORDER — PERFLUTREN LIPID MICROSPHERE
5.0000 mL | Freq: Once | INTRAVENOUS | Status: AC
  Administered 2014-02-26: 5 mL via INTRAVENOUS

## 2014-02-26 NOTE — Progress Notes (Signed)
Echocardiogram performed with Def[facility].  

## 2014-03-01 ENCOUNTER — Other Ambulatory Visit: Payer: Self-pay | Admitting: Family Medicine

## 2014-03-03 ENCOUNTER — Telehealth: Payer: Self-pay

## 2014-03-03 NOTE — Telephone Encounter (Signed)
I sent script e-scribe. 

## 2014-03-03 NOTE — Telephone Encounter (Signed)
Refill request for Amlodipine 10mg  #30 sent to [address]

## 2014-03-24 ENCOUNTER — Other Ambulatory Visit (INDEPENDENT_AMBULATORY_CARE_PROVIDER_SITE_OTHER): Payer: 59 | Admitting: *Deleted

## 2014-03-24 DIAGNOSIS — Z0000 Encounter for general adult medical examination without abnormal findings: Secondary | ICD-10-CM

## 2014-03-25 ENCOUNTER — Other Ambulatory Visit (INDEPENDENT_AMBULATORY_CARE_PROVIDER_SITE_OTHER): Payer: 59

## 2014-03-25 DIAGNOSIS — Z7901 Long term (current) use of anticoagulants: Secondary | ICD-10-CM

## 2014-03-25 DIAGNOSIS — Z0000 Encounter for general adult medical examination without abnormal findings: Secondary | ICD-10-CM

## 2014-03-25 DIAGNOSIS — I4891 Unspecified atrial fibrillation: Secondary | ICD-10-CM

## 2014-03-25 LAB — CBC WITH DIFFERENTIAL/PLATELET
BASOS PCT: 0.4 % (ref 0.0–3.0)
Basophils Absolute: 0 10*3/uL (ref 0.0–0.1)
EOS PCT: 2.3 % (ref 0.0–5.0)
Eosinophils Absolute: 0.2 10*3/uL (ref 0.0–0.7)
HEMATOCRIT: 46.4 % (ref 39.0–52.0)
HEMOGLOBIN: 15.6 g/dL (ref 13.0–17.0)
LYMPHS PCT: 28.3 % (ref 12.0–46.0)
Lymphs Abs: 2.4 10*3/uL (ref 0.7–4.0)
MCHC: 33.5 g/dL (ref 30.0–36.0)
MCV: 96 fl (ref 78.0–100.0)
MONO ABS: 0.8 10*3/uL (ref 0.1–1.0)
MONOS PCT: 9.4 % (ref 3.0–12.0)
NEUTROS PCT: 59.6 % (ref 43.0–77.0)
Neutro Abs: 5 10*3/uL (ref 1.4–7.7)
Platelets: 203 10*3/uL (ref 150.0–400.0)
RBC: 4.83 Mil/uL (ref 4.22–5.81)
RDW: 13.9 % (ref 11.5–15.5)
WBC: 8.4 10*3/uL (ref 4.0–10.5)

## 2014-03-25 LAB — POCT URINALYSIS DIPSTICK
BILIRUBIN UA: NEGATIVE
Glucose, UA: NEGATIVE
Ketones, UA: NEGATIVE
Leukocytes, UA: NEGATIVE
NITRITE UA: NEGATIVE
PH UA: 5
Protein, UA: NEGATIVE
Spec Grav, UA: 1.025
UROBILINOGEN UA: 0.2

## 2014-03-25 LAB — COMPREHENSIVE METABOLIC PANEL
ALT: 30 U/L (ref 0–53)
AST: 22 U/L (ref 0–37)
Albumin: 4.2 g/dL (ref 3.5–5.2)
Alkaline Phosphatase: 91 U/L (ref 39–117)
BILIRUBIN TOTAL: 0.5 mg/dL (ref 0.2–1.2)
BUN: 21 mg/dL (ref 6–23)
CO2: 31 mEq/L (ref 19–32)
Calcium: 11.3 mg/dL — ABNORMAL HIGH (ref 8.4–10.5)
Chloride: 102 mEq/L (ref 96–112)
Creatinine, Ser: 0.96 mg/dL (ref 0.40–1.50)
GFR: 83.76 mL/min (ref >60.00)
Glucose, Bld: 104 mg/dL — ABNORMAL HIGH (ref 70–99)
POTASSIUM: 4.3 meq/L (ref 3.5–5.1)
Sodium: 138 mEq/L (ref 135–145)
Total Protein: 6.8 g/dL (ref 6.0–8.3)

## 2014-03-25 LAB — LIPID PANEL
Cholesterol: 250 mg/dL — ABNORMAL HIGH (ref 0–200)
HDL: 31.8 mg/dL — ABNORMAL LOW (ref >39.00)
NONHDL: 218.2
TRIGLYCERIDES: 211 mg/dL — AB (ref 0.0–149.0)
Total CHOL/HDL Ratio: 8
VLDL: 42.2 mg/dL — AB (ref 0.0–40.0)

## 2014-03-25 LAB — LDL CHOLESTEROL, DIRECT: Direct LDL: 168 mg/dL

## 2014-03-25 LAB — TSH: TSH: 3.42 u[IU]/mL (ref 0.35–4.50)

## 2014-03-25 LAB — PSA: PSA: 0.24 ng/mL (ref 0.10–4.00)

## 2014-03-26 ENCOUNTER — Ambulatory Visit (INDEPENDENT_AMBULATORY_CARE_PROVIDER_SITE_OTHER): Payer: 59 | Admitting: *Deleted

## 2014-03-26 DIAGNOSIS — Z7901 Long term (current) use of anticoagulants: Secondary | ICD-10-CM

## 2014-03-26 DIAGNOSIS — Z5181 Encounter for therapeutic drug level monitoring: Secondary | ICD-10-CM

## 2014-03-26 DIAGNOSIS — I4891 Unspecified atrial fibrillation: Secondary | ICD-10-CM

## 2014-03-26 LAB — POCT INR: INR: 3.1

## 2014-04-01 ENCOUNTER — Telehealth: Payer: Self-pay | Admitting: Family Medicine

## 2014-04-01 ENCOUNTER — Ambulatory Visit (INDEPENDENT_AMBULATORY_CARE_PROVIDER_SITE_OTHER): Payer: 59 | Admitting: Family Medicine

## 2014-04-01 ENCOUNTER — Encounter: Payer: Self-pay | Admitting: Family Medicine

## 2014-04-01 VITALS — BP 148/96 | HR 80 | Temp 98.6°F | Ht 71.0 in | Wt 297.0 lb

## 2014-04-01 DIAGNOSIS — Z0000 Encounter for general adult medical examination without abnormal findings: Secondary | ICD-10-CM

## 2014-04-01 MED ORDER — EZETIMIBE 10 MG PO TABS: 10.0000 mg | ORAL_TABLET | Freq: Every day | ORAL | Status: DC

## 2014-04-01 MED ORDER — DOXAZOSIN MESYLATE ER 4 MG PO TB24: 4.0000 mg | ORAL_TABLET | Freq: Every day | ORAL | Status: DC

## 2014-04-01 NOTE — Telephone Encounter (Signed)
Agreed. Switch to plain Cardura 4 mg (not XR) daily, #30 with 2 rf

## 2014-04-01 NOTE — Telephone Encounter (Signed)
[facility], [place] states doxazosin (CARDURA XL) 4 MG 24 hr tablet is $80 and wants to know if Dr.Fry wants to change the medication to doxazosin 4 mg tablet?

## 2014-04-01 NOTE — Progress Notes (Signed)
   Subjective:    Patient ID: [name], male    DOB: 08/03/1950, 64 y.o.   MRN: [id number]  HPI 64 yr old male for a cpx. He still struggles with chronic pain in the lower back, in both hips, and in both knees. He knows he needs to exercise to lose weight, but he finds this very difficult to do. He gets very frustrated with all his health problems but he does the best he can. His recent labs show his cholesterol and glucose to be elevated and his BP is up today. He has stopped taking any meds for his lipids because all statins make his muscles ache.    Review of Systems  Constitutional: Negative.   HENT: Negative.   Eyes: Negative.   Respiratory: Negative.   Cardiovascular: Negative.   Gastrointestinal: Negative.   Genitourinary: Negative.   Musculoskeletal: Positive for back pain, arthralgias and gait problem. Negative for myalgias, joint swelling, neck pain and neck stiffness.  Skin: Negative.   Neurological: Negative.   Psychiatric/Behavioral: Negative.        Objective:   Physical Exam  Constitutional: He is oriented to person, place, and time. He appears well-developed and well-nourished. No distress.  HENT:  Head: Normocephalic and atraumatic.  Right Ear: External ear normal.  Left Ear: External ear normal.  Nose: Nose normal.  Mouth/Throat: Oropharynx is clear and moist. No oropharyngeal exudate.  Eyes: Conjunctivae and EOM are normal. Pupils are equal, round, and reactive to light. Right eye exhibits no discharge. Left eye exhibits no discharge. No scleral icterus.  Neck: Neck supple. No JVD present. No tracheal deviation present. No thyromegaly present.  Cardiovascular: Normal rate, regular rhythm, normal heart sounds and intact distal pulses.  Exam reveals no gallop and no friction rub.   No murmur heard. Pulmonary/Chest: Effort normal and breath sounds normal. No respiratory distress. He has no wheezes. He has no rales. He exhibits no tenderness.  Abdominal: Soft.  Bowel sounds are normal. He exhibits no distension and no mass. There is no tenderness. There is no rebound and no guarding.  Genitourinary: Rectum normal, prostate normal and penis normal. Guaiac negative stool. No penile tenderness.  Musculoskeletal: Normal range of motion. He exhibits no edema or tenderness.  Lymphadenopathy:    He has no cervical adenopathy.  Neurological: He is alert and oriented to person, place, and time. He has normal reflexes. No cranial nerve deficit. He exhibits normal muscle tone. Coordination normal.  Skin: Skin is warm and dry. No rash noted. He is not diaphoretic. No erythema. No pallor.  Psychiatric: He has a normal mood and affect. His behavior is normal. Judgment and thought content normal.          Assessment & Plan:  Well exam. We will try him on Zetia 10 mg daily for the cholesterol. Start on Cardura 4 mg daily for the BP. He will watch the diet closely. Recheck one month

## 2014-04-01 NOTE — Progress Notes (Signed)
Pre visit review using our clinic review tool, if applicable. No additional management support is needed unless otherwise documented below in the visit note. 

## 2014-04-03 MED ORDER — DOXAZOSIN MESYLATE 4 MG PO TABS: 4.0000 mg | ORAL_TABLET | Freq: Every day | ORAL | Status: DC

## 2014-04-03 NOTE — Telephone Encounter (Signed)
Pharm needs another fax w/ the correct drug.  Dr fry states not XR, but script sent in states xl. Please resend [name] [facility]/ [address]

## 2014-04-03 NOTE — Telephone Encounter (Signed)
Pt went to pick up script for Zetia and it is too expensive. Can you recommend something else to replace this? Pt did say that he is reluctant to start back on a statin however maybe he can try a lower dose & maybe not take it every day? Pharmacy could not suggest anything any cheaper other than a statin.

## 2014-04-03 NOTE — Telephone Encounter (Signed)
The correct was sent in.

## 2014-04-03 NOTE — Telephone Encounter (Signed)
I did send in below script e-scribe and spoke with pt.

## 2014-04-04 NOTE — Telephone Encounter (Signed)
Actually I think any of the statins, even at a low dose, will not be tolerated very well. I suggest he watch a strict low fat diet and avoid cholesterol prescriptions. Try fish oil or krill oil capsules OTC

## 2014-04-07 NOTE — Telephone Encounter (Signed)
I spoke with pt  

## 2014-05-07 ENCOUNTER — Ambulatory Visit (INDEPENDENT_AMBULATORY_CARE_PROVIDER_SITE_OTHER): Payer: 59 | Admitting: *Deleted

## 2014-05-07 DIAGNOSIS — Z7901 Long term (current) use of anticoagulants: Secondary | ICD-10-CM

## 2014-05-07 DIAGNOSIS — I4891 Unspecified atrial fibrillation: Secondary | ICD-10-CM

## 2014-05-07 DIAGNOSIS — Z5181 Encounter for therapeutic drug level monitoring: Secondary | ICD-10-CM

## 2014-05-07 LAB — POCT INR: INR: 4.3

## 2014-05-16 ENCOUNTER — Other Ambulatory Visit: Payer: Self-pay | Admitting: Internal Medicine

## 2014-05-21 ENCOUNTER — Ambulatory Visit (INDEPENDENT_AMBULATORY_CARE_PROVIDER_SITE_OTHER): Payer: 59 | Admitting: *Deleted

## 2014-05-21 DIAGNOSIS — Z5181 Encounter for therapeutic drug level monitoring: Secondary | ICD-10-CM

## 2014-05-21 DIAGNOSIS — I4891 Unspecified atrial fibrillation: Secondary | ICD-10-CM

## 2014-05-21 DIAGNOSIS — Z7901 Long term (current) use of anticoagulants: Secondary | ICD-10-CM

## 2014-05-21 LAB — POCT INR: INR: 3

## 2014-05-24 ENCOUNTER — Other Ambulatory Visit: Payer: Self-pay | Admitting: Internal Medicine

## 2014-05-31 ENCOUNTER — Other Ambulatory Visit: Payer: Self-pay | Admitting: Internal Medicine

## 2014-06-11 ENCOUNTER — Ambulatory Visit (INDEPENDENT_AMBULATORY_CARE_PROVIDER_SITE_OTHER): Payer: 59 | Admitting: *Deleted

## 2014-06-11 DIAGNOSIS — Z7901 Long term (current) use of anticoagulants: Secondary | ICD-10-CM | POA: Diagnosis not present

## 2014-06-11 DIAGNOSIS — Z5181 Encounter for therapeutic drug level monitoring: Secondary | ICD-10-CM | POA: Diagnosis not present

## 2014-06-11 DIAGNOSIS — I4891 Unspecified atrial fibrillation: Secondary | ICD-10-CM

## 2014-06-11 LAB — POCT INR: INR: 3.6

## 2014-06-20 ENCOUNTER — Other Ambulatory Visit: Payer: Self-pay | Admitting: Family Medicine

## 2014-06-23 ENCOUNTER — Other Ambulatory Visit: Payer: Self-pay | Admitting: Family Medicine

## 2014-06-25 ENCOUNTER — Ambulatory Visit (INDEPENDENT_AMBULATORY_CARE_PROVIDER_SITE_OTHER): Payer: 59 | Admitting: *Deleted

## 2014-06-25 DIAGNOSIS — Z5181 Encounter for therapeutic drug level monitoring: Secondary | ICD-10-CM

## 2014-06-25 DIAGNOSIS — I4891 Unspecified atrial fibrillation: Secondary | ICD-10-CM

## 2014-06-25 DIAGNOSIS — Z7901 Long term (current) use of anticoagulants: Secondary | ICD-10-CM

## 2014-06-25 LAB — POCT INR: INR: 3.1

## 2014-07-09 ENCOUNTER — Ambulatory Visit (INDEPENDENT_AMBULATORY_CARE_PROVIDER_SITE_OTHER): Payer: 59 | Admitting: *Deleted

## 2014-07-09 DIAGNOSIS — Z5181 Encounter for therapeutic drug level monitoring: Secondary | ICD-10-CM | POA: Diagnosis not present

## 2014-07-09 DIAGNOSIS — Z7901 Long term (current) use of anticoagulants: Secondary | ICD-10-CM

## 2014-07-09 DIAGNOSIS — I4891 Unspecified atrial fibrillation: Secondary | ICD-10-CM | POA: Diagnosis not present

## 2014-07-09 LAB — POCT INR: INR: 2.9

## 2014-07-16 ENCOUNTER — Other Ambulatory Visit: Payer: Self-pay | Admitting: Family Medicine

## 2014-07-16 ENCOUNTER — Other Ambulatory Visit: Payer: Self-pay | Admitting: *Deleted

## 2014-07-16 MED ORDER — HYDROCHLOROTHIAZIDE 25 MG PO TABS: ORAL_TABLET | ORAL | Status: DC

## 2014-07-30 ENCOUNTER — Ambulatory Visit (INDEPENDENT_AMBULATORY_CARE_PROVIDER_SITE_OTHER): Payer: 59

## 2014-07-30 DIAGNOSIS — Z5181 Encounter for therapeutic drug level monitoring: Secondary | ICD-10-CM | POA: Diagnosis not present

## 2014-07-30 DIAGNOSIS — I4891 Unspecified atrial fibrillation: Secondary | ICD-10-CM | POA: Diagnosis not present

## 2014-07-30 DIAGNOSIS — Z7901 Long term (current) use of anticoagulants: Secondary | ICD-10-CM | POA: Diagnosis not present

## 2014-07-30 LAB — POCT INR: INR: 2.7

## 2014-08-16 ENCOUNTER — Other Ambulatory Visit: Payer: Self-pay | Admitting: Internal Medicine

## 2014-08-27 ENCOUNTER — Ambulatory Visit (INDEPENDENT_AMBULATORY_CARE_PROVIDER_SITE_OTHER): Payer: 59 | Admitting: *Deleted

## 2014-08-27 DIAGNOSIS — Z7901 Long term (current) use of anticoagulants: Secondary | ICD-10-CM

## 2014-08-27 DIAGNOSIS — I4891 Unspecified atrial fibrillation: Secondary | ICD-10-CM

## 2014-08-27 DIAGNOSIS — Z5181 Encounter for therapeutic drug level monitoring: Secondary | ICD-10-CM

## 2014-08-27 LAB — POCT INR: INR: 3.1

## 2014-09-17 ENCOUNTER — Ambulatory Visit (INDEPENDENT_AMBULATORY_CARE_PROVIDER_SITE_OTHER): Payer: 59 | Admitting: *Deleted

## 2014-09-17 DIAGNOSIS — I4891 Unspecified atrial fibrillation: Secondary | ICD-10-CM

## 2014-09-17 DIAGNOSIS — Z7901 Long term (current) use of anticoagulants: Secondary | ICD-10-CM

## 2014-09-17 DIAGNOSIS — Z5181 Encounter for therapeutic drug level monitoring: Secondary | ICD-10-CM | POA: Diagnosis not present

## 2014-09-17 LAB — POCT INR: INR: 2.7

## 2014-10-07 ENCOUNTER — Other Ambulatory Visit: Payer: Self-pay | Admitting: Internal Medicine

## 2014-10-15 ENCOUNTER — Ambulatory Visit (INDEPENDENT_AMBULATORY_CARE_PROVIDER_SITE_OTHER): Payer: 59 | Admitting: *Deleted

## 2014-10-15 DIAGNOSIS — I4891 Unspecified atrial fibrillation: Secondary | ICD-10-CM

## 2014-10-15 DIAGNOSIS — Z7901 Long term (current) use of anticoagulants: Secondary | ICD-10-CM

## 2014-10-15 DIAGNOSIS — Z5181 Encounter for therapeutic drug level monitoring: Secondary | ICD-10-CM | POA: Diagnosis not present

## 2014-10-15 LAB — POCT INR: INR: 3.1

## 2014-11-07 ENCOUNTER — Other Ambulatory Visit: Payer: Self-pay | Admitting: Internal Medicine

## 2014-11-13 ENCOUNTER — Ambulatory Visit (INDEPENDENT_AMBULATORY_CARE_PROVIDER_SITE_OTHER): Payer: 59 | Admitting: *Deleted

## 2014-11-13 DIAGNOSIS — I4891 Unspecified atrial fibrillation: Secondary | ICD-10-CM

## 2014-11-13 DIAGNOSIS — Z5181 Encounter for therapeutic drug level monitoring: Secondary | ICD-10-CM | POA: Diagnosis not present

## 2014-11-13 DIAGNOSIS — Z7901 Long term (current) use of anticoagulants: Secondary | ICD-10-CM | POA: Diagnosis not present

## 2014-11-13 LAB — POCT INR: INR: 2.5

## 2014-11-18 ENCOUNTER — Other Ambulatory Visit: Payer: Self-pay | Admitting: Family Medicine

## 2014-11-18 ENCOUNTER — Other Ambulatory Visit: Payer: Self-pay | Admitting: Internal Medicine

## 2014-12-11 ENCOUNTER — Ambulatory Visit (INDEPENDENT_AMBULATORY_CARE_PROVIDER_SITE_OTHER): Payer: 59 | Admitting: Pharmacist

## 2014-12-11 DIAGNOSIS — Z5181 Encounter for therapeutic drug level monitoring: Secondary | ICD-10-CM

## 2014-12-11 DIAGNOSIS — I4891 Unspecified atrial fibrillation: Secondary | ICD-10-CM

## 2014-12-11 DIAGNOSIS — Z7901 Long term (current) use of anticoagulants: Secondary | ICD-10-CM | POA: Diagnosis not present

## 2014-12-11 LAB — POCT INR: INR: 2.7

## 2015-01-12 ENCOUNTER — Ambulatory Visit: Payer: 59 | Admitting: Internal Medicine

## 2015-01-15 ENCOUNTER — Ambulatory Visit (INDEPENDENT_AMBULATORY_CARE_PROVIDER_SITE_OTHER): Payer: PPO | Admitting: *Deleted

## 2015-01-15 DIAGNOSIS — I4891 Unspecified atrial fibrillation: Secondary | ICD-10-CM | POA: Diagnosis not present

## 2015-01-15 DIAGNOSIS — Z7901 Long term (current) use of anticoagulants: Secondary | ICD-10-CM | POA: Diagnosis not present

## 2015-01-15 DIAGNOSIS — Z5181 Encounter for therapeutic drug level monitoring: Secondary | ICD-10-CM | POA: Diagnosis not present

## 2015-01-15 LAB — POCT INR: INR: 3

## 2015-01-16 ENCOUNTER — Ambulatory Visit (INDEPENDENT_AMBULATORY_CARE_PROVIDER_SITE_OTHER): Payer: PPO | Admitting: Internal Medicine

## 2015-01-16 ENCOUNTER — Encounter: Payer: Self-pay | Admitting: Internal Medicine

## 2015-01-16 VITALS — BP 144/90 | HR 74 | Ht 71.0 in | Wt 287.8 lb

## 2015-01-16 DIAGNOSIS — I4891 Unspecified atrial fibrillation: Secondary | ICD-10-CM

## 2015-01-16 NOTE — Progress Notes (Signed)
Cardiology Office Note   Date:  01/16/2015   ID:  [name], DOB [birth year], MRN [id number]  PCP:  [name], MD  Cardiologist:   [name], MD   Chief Complaint  Patient presents with  . Atrial Fibrillation      History of Present Illness: [name] is a 64 y.o. male with a history of Patient is a 64 yo with a history of mitral regurgitation, HTN, afib, sleep apnea, morbid obesity . I saw him in Dec 2014 He says that this year has been a very bad one for him Has had chronic pain in his legs Was on disability Breathing is OK No CP No PND  Knows he has to lose wt Does not do much   I saw pt in Jan 2015      Current Outpatient Prescriptions  Medication Sig Dispense Refill  . amLODipine (NORVASC) 10 MG tablet TAKE 1 TABLET (10 MG TOTAL) BY MOUTH DAILY. 30 tablet 10  . aspirin 81 MG tablet Take 81 mg by mouth daily.      . bisacodyl (DULCOLAX) 5 MG EC tablet Take 5 mg by mouth daily as needed for moderate constipation.    . carvedilol (COREG) 25 MG tablet TAKE 1 TABLET (25 MG TOTAL) BY MOUTH 2 (TWO) TIMES DAILY. 60 tablet 2  . cloNIDine (CATAPRES) 0.2 MG tablet TAKE 1 TABLET (0.2 MG TOTAL) BY MOUTH 2 (TWO) TIMES DAILY. 60 tablet 1  . docusate sodium (COLACE) 100 MG capsule Take 100 mg by mouth 3 (three) times daily as needed.     . doxazosin (CARDURA) 4 MG tablet TAKE 1 TABLET (4 MG TOTAL) BY MOUTH DAILY. 30 tablet 10  . ezetimibe (ZETIA) 10 MG tablet Take 1 tablet (10 mg total) by mouth daily. 30 tablet 11  . fish oil-omega-3 fatty acids 1000 MG capsule Take 1 g by mouth daily.      . hydrochlorothiazide (HYDRODIURIL) 25 MG tablet TAKE 1 TABLET (25 MG TOTAL) BY MOUTH DAILY  (NEW DOSE) 30 tablet 11  . losartan (COZAAR) 100 MG tablet TAKE 1 TABLET (100 MG TOTAL) BY MOUTH DAILY. 30 tablet 3  . VITAMIN D, CHOLECALCIFEROL, PO Take 5,000 Units by mouth daily.    Marland Kitchen warfarin (COUMADIN) 5 MG tablet TAKE AS DIRECTED BY ANTICOAGULATION CLINIC (60 TABLETS IS 30 DAY  SUPPLY) 60 tablet 3   No current facility-administered medications for this visit.    Allergies:   Penicillins   Past Medical History  Diagnosis Date  . Obesity   . Hypertension   . Heart valve problem     Dr [name]  . Atrial fibrillation [facility])     sees Dr. [name]   . Other and unspecified hyperlipidemia   . Other and unspecified hyperlipidemia   . Mitral valve disorders   . OSA (obstructive sleep apnea)     cpap    Past Surgical History  Procedure Laterality Date  . Colonoscopy  03-16-10    per Dr. Fuller Plan, benign polyp, repeat in 5 yrs   . No past surgeries    . Lumbar laminectomy/decompression microdiscectomy N/A 11/21/2012    Procedure: Lumbar Three to Lumbar Five Laminectomy;  Surgeon: Floyce Stakes, MD;  Location: [facility];  Service: Neurosurgery;  Laterality: N/A;  Lumbar Three to Lumbar Five Laminectomy     Social History:  The patient  reports that he has never smoked. He has never used smokeless tobacco. He reports that he does not  drink alcohol or use illicit drugs.   Family History:  The patient's family history includes Coronary artery disease in an other family member; Hypertension in an other family member; Stroke in his father.    ROS:  Please see the history of present illness. All other systems are reviewed and  Negative to the above problem except as noted.    PHYSICAL EXAM: VS:  BP 144/90 mmHg  Pulse 74  Ht 5\' 11"  (1.803 m)  Wt 130.545 kg (287 lb 12.8 oz)  BMI 40.16 kg/m2  [id number] obese , in no acute distress HEENT: normal Neck: no JVD, carotid bruits, or masses Cardiac:  Irreg irreg; no murmurs, rubs, or gallops,Tr edema  Respiratory:  clear to auscultation bilaterally, normal work of breathing GI: soft, nontender, nondistended, + BS  No hepatomegaly  MS: no deformity Moving all extremities   Skin: warm and dry, no rash Neuro:  Strength and sensation are intact Psych: euthymic mood, full affect   EKG:  EKG is ordered    Afib  74 bpm  RBBB LAFB     Lipid Panel    Component Value Date/Time   CHOL 250* 03/25/2014 0844   TRIG 211.0* 03/25/2014 0844   HDL 31.80* 03/25/2014 0844   CHOLHDL 8 03/25/2014 0844   VLDL 42.2* 03/25/2014 0844   LDLCALC 98 02/18/2013 0902   LDLDIRECT 168.0 03/25/2014 0844      Wt Readings from Last 3 Encounters:  01/16/15 130.545 kg (287 lb 12.8 oz)  04/01/14 134.718 kg (297 lb)  02/21/14 135.626 kg (299 lb)      ASSESSMENT AND PLAN:  1.  Atrial fib  Continue rate control    2.  HTN  BP is fair  Has continued to be a problem   Pt cannot tolerate CPAP  ? Renal artery scan  Difficult with size  3.  HL  Start Zetia.    4  Sleep apnea  Not toleratnt to CPAP  5  Morbid obesity  Needs to lose wt  Discussed diet, stationary bike  6.  Mitral regurgitation  Very difficut to image as very eccentric.       Signed, [name], MD  01/16/2015 3:42 PM    [facility] [place], [place], [place]  [address] Phone: [phone]; Fax: [phone]

## 2015-01-16 NOTE — Patient Instructions (Signed)
Medication Instructions:   NO CHANGE  Follow-Up:  Your physician wants you to follow-up in: [place] will receive a reminder letter in the mail two months in advance. If you don't receive a letter, please call our office to schedule the follow-up appointment.   If you need a refill on your cardiac medications before your next appointment, please call your pharmacy.

## 2015-01-17 ENCOUNTER — Other Ambulatory Visit: Payer: Self-pay | Admitting: Internal Medicine

## 2015-01-22 ENCOUNTER — Encounter: Payer: Self-pay | Admitting: Gastroenterology

## 2015-02-01 ENCOUNTER — Other Ambulatory Visit: Payer: Self-pay | Admitting: Internal Medicine

## 2015-02-26 ENCOUNTER — Ambulatory Visit (INDEPENDENT_AMBULATORY_CARE_PROVIDER_SITE_OTHER): Payer: PPO | Admitting: *Deleted

## 2015-02-26 DIAGNOSIS — I4891 Unspecified atrial fibrillation: Secondary | ICD-10-CM | POA: Diagnosis not present

## 2015-02-26 DIAGNOSIS — Z7901 Long term (current) use of anticoagulants: Secondary | ICD-10-CM | POA: Diagnosis not present

## 2015-02-26 DIAGNOSIS — Z5181 Encounter for therapeutic drug level monitoring: Secondary | ICD-10-CM

## 2015-02-26 LAB — POCT INR: INR: 2.7

## 2015-03-14 ENCOUNTER — Other Ambulatory Visit: Payer: Self-pay | Admitting: Family Medicine

## 2015-03-25 ENCOUNTER — Ambulatory Visit: Payer: PPO | Admitting: Gastroenterology

## 2015-03-25 ENCOUNTER — Telehealth: Payer: Self-pay

## 2015-03-25 ENCOUNTER — Ambulatory Visit (INDEPENDENT_AMBULATORY_CARE_PROVIDER_SITE_OTHER): Payer: PPO | Admitting: Gastroenterology

## 2015-03-25 ENCOUNTER — Encounter: Payer: Self-pay | Admitting: Gastroenterology

## 2015-03-25 VITALS — BP 140/80 | HR 64 | Ht 68.25 in | Wt 287.0 lb

## 2015-03-25 DIAGNOSIS — Z7901 Long term (current) use of anticoagulants: Secondary | ICD-10-CM

## 2015-03-25 DIAGNOSIS — Z86010 Personal history of colonic polyps: Secondary | ICD-10-CM | POA: Diagnosis not present

## 2015-03-25 DIAGNOSIS — I482 Chronic atrial fibrillation, unspecified: Secondary | ICD-10-CM

## 2015-03-25 DIAGNOSIS — K5900 Constipation, unspecified: Secondary | ICD-10-CM

## 2015-03-25 NOTE — Telephone Encounter (Signed)
OK to come off of coumadin 5 days prior

## 2015-03-25 NOTE — Progress Notes (Signed)
    History of Present Illness: This is a 65 year old male returning for surveillance of adenomatous colon polyps. His last colonoscopy was in January 2012 with one 6 mm adenomatous polyp removed. He is maintained on warfarin for management of chronic atrial fibrillation. He has ongoing problems with constipation and takes Ducolax 2-3 per day. Denies weight loss, abdominal pain, diarrhea, change in stool caliber, melena, hematochezia, nausea, vomiting, dysphagia, reflux symptoms, chest pain.  Review of Systems: Pertinent positive and negative review of systems were noted in the above HPI section. All other review of systems were otherwise negative.  Current Medications, Allergies, Past Medical History, Past Surgical History, Family History and Social History were reviewed in [employer] record.  Physical Exam: General: Well developed, well nourished, no acute distress Head: Normocephalic and atraumatic Eyes:  sclerae anicteric, EOMI Ears: Normal auditory acuity Mouth: No deformity or lesions Neck: Supple, no masses or thyromegaly Lungs: Clear throughout to auscultation Heart: Regular rate and rhythm; no murmurs, rubs or bruits Abdomen: Soft, non tender and non distended. No masses, hepatosplenomegaly or hernias noted. Normal Bowel sounds Rectal: deferred to colonoscopy Musculoskeletal: Symmetrical with no gross deformities  Skin: No lesions on visible extremities Pulses:  Normal pulses noted Extremities: No clubbing, cyanosis, edema or deformities noted Neurological: Alert oriented x 4, grossly nonfocal Cervical Nodes:  No significant cervical adenopathy Inguinal Nodes: No significant inguinal adenopathy Psychological:  Alert and cooperative. Normal mood and affect  Assessment and Recommendations:  1. Personal history of adenomatous colon polyps due for surveillance colonoscopy. The risks (including bleeding, perforation, infection, missed lesions, medication  reactions and possible hospitalization or surgery if complications occur), benefits, and alternatives to colonoscopy with possible biopsy and possible polypectomy were discussed with the patient and they consent to proceed.   2. Atrial fibrillation maintained on warfarin. Hold warfarin 5 days before procedure - will instruct when and how to resume after procedure. Low but real risk of cardiovascular event such as heart attack, stroke, embolism, thrombosis or ischemia/infarct of other organs off warfarin explained and need to seek urgent help if this occurs. The patient consents to proceed. Will communicate by phone or EMR with patient's prescribing provider to confirm that holding warfarin is reasonable in this case.   3. Constipation. Continue Ducolax 2-3 per day or try Miralax 2-3 times per day.

## 2015-03-25 NOTE — Telephone Encounter (Signed)
  03/25/2015   RE: [name] DOB: [birth year] MRN: [id number]   Dear Dr. Harrington Challenger,    We have scheduled the above patient for an endoscopic procedure. Our records show that he is on anticoagulation therapy.   Please advise as to how long the patient may come off his therapy of warfarin prior to the procedure, which has not been scheduled yet.  Please route your answer to Marlon Pel, CMA   Sincerely,    Marlon Pel, CMA

## 2015-03-25 NOTE — Patient Instructions (Addendum)
Please call our office back at [phone] to schedule your Colonoscopy and nurse visit at your convenience.   Thank you for choosing me and [facility].  Pricilla Riffle. Dagoberto Ligas., MD., Marval Regal

## 2015-04-03 ENCOUNTER — Other Ambulatory Visit: Payer: Self-pay | Admitting: Internal Medicine

## 2015-04-09 ENCOUNTER — Ambulatory Visit (INDEPENDENT_AMBULATORY_CARE_PROVIDER_SITE_OTHER): Payer: PPO | Admitting: *Deleted

## 2015-04-09 ENCOUNTER — Encounter: Payer: Self-pay | Admitting: Gastroenterology

## 2015-04-09 DIAGNOSIS — Z7901 Long term (current) use of anticoagulants: Secondary | ICD-10-CM | POA: Diagnosis not present

## 2015-04-09 DIAGNOSIS — Z5181 Encounter for therapeutic drug level monitoring: Secondary | ICD-10-CM | POA: Diagnosis not present

## 2015-04-09 DIAGNOSIS — I4891 Unspecified atrial fibrillation: Secondary | ICD-10-CM | POA: Diagnosis not present

## 2015-04-09 DIAGNOSIS — I482 Chronic atrial fibrillation, unspecified: Secondary | ICD-10-CM

## 2015-04-09 LAB — POCT INR: INR: 2.9

## 2015-04-10 ENCOUNTER — Telehealth: Payer: Self-pay | Admitting: Gastroenterology

## 2015-04-10 NOTE — Telephone Encounter (Signed)
Scheduled patient's Colonoscopy for 06/09/15 and pre-visit on 06/01/15. Went over again that patient can hold warfarin 5 days prior to his procedure per Cardiologist documented on previous phone note on 03/25/15. Also informed patient to take a double dose of dulcolax leading up to Colonoscopy. Pt states he remembers talking to Dr. Fuller Plan and verbalized understanding.

## 2015-04-13 ENCOUNTER — Other Ambulatory Visit: Payer: Self-pay | Admitting: Family Medicine

## 2015-04-21 ENCOUNTER — Other Ambulatory Visit: Payer: Self-pay | Admitting: Family Medicine

## 2015-04-21 MED ORDER — EZETIMIBE 10 MG PO TABS: ORAL_TABLET | ORAL | Status: DC

## 2015-05-14 ENCOUNTER — Other Ambulatory Visit: Payer: Self-pay | Admitting: Family Medicine

## 2015-05-20 ENCOUNTER — Ambulatory Visit (INDEPENDENT_AMBULATORY_CARE_PROVIDER_SITE_OTHER): Payer: PPO | Admitting: *Deleted

## 2015-05-20 ENCOUNTER — Other Ambulatory Visit: Payer: Self-pay | Admitting: Family Medicine

## 2015-05-20 DIAGNOSIS — I482 Chronic atrial fibrillation, unspecified: Secondary | ICD-10-CM

## 2015-05-20 DIAGNOSIS — Z5181 Encounter for therapeutic drug level monitoring: Secondary | ICD-10-CM | POA: Diagnosis not present

## 2015-05-20 DIAGNOSIS — I4891 Unspecified atrial fibrillation: Secondary | ICD-10-CM

## 2015-05-20 DIAGNOSIS — Z7901 Long term (current) use of anticoagulants: Secondary | ICD-10-CM

## 2015-05-20 LAB — POCT INR: INR: 2.8

## 2015-05-21 ENCOUNTER — Telehealth: Payer: Self-pay | Admitting: Family Medicine

## 2015-05-21 ENCOUNTER — Other Ambulatory Visit: Payer: Self-pay | Admitting: General Practice

## 2015-05-21 MED ORDER — AMLODIPINE BESYLATE 10 MG PO TABS: 10.0000 mg | ORAL_TABLET | Freq: Every day | ORAL | Status: DC

## 2015-05-21 NOTE — Telephone Encounter (Signed)
error 

## 2015-05-25 ENCOUNTER — Encounter: Payer: Self-pay | Admitting: Family Medicine

## 2015-05-25 ENCOUNTER — Ambulatory Visit (INDEPENDENT_AMBULATORY_CARE_PROVIDER_SITE_OTHER): Payer: PPO | Admitting: Family Medicine

## 2015-05-25 VITALS — BP 138/82 | HR 60 | Temp 98.4°F | Ht 68.25 in | Wt 289.0 lb

## 2015-05-25 DIAGNOSIS — I10 Essential (primary) hypertension: Secondary | ICD-10-CM | POA: Diagnosis not present

## 2015-05-25 DIAGNOSIS — I482 Chronic atrial fibrillation, unspecified: Secondary | ICD-10-CM

## 2015-05-25 DIAGNOSIS — M25551 Pain in right hip: Secondary | ICD-10-CM | POA: Diagnosis not present

## 2015-05-25 MED ORDER — DOXAZOSIN MESYLATE 4 MG PO TABS: 4.0000 mg | ORAL_TABLET | Freq: Every day | ORAL | Status: DC

## 2015-05-25 MED ORDER — LOSARTAN POTASSIUM 100 MG PO TABS: ORAL_TABLET | ORAL | Status: DC

## 2015-05-25 MED ORDER — HYDROCHLOROTHIAZIDE 25 MG PO TABS: ORAL_TABLET | ORAL | Status: DC

## 2015-05-25 MED ORDER — AMLODIPINE BESYLATE 10 MG PO TABS: 10.0000 mg | ORAL_TABLET | Freq: Every day | ORAL | Status: DC

## 2015-05-25 NOTE — Progress Notes (Signed)
   Subjective:    Patient ID: [name], male    DOB: 11/13/1950, 65 y.o.   MRN: [id number]  HPI Here to follow up on HTN and for oter issues. He had lost insurance for awhile but now he has Medicare and a supplement, so he wants to pursue cleaning up his dental work and to take care of a painful right hip. He has been seeing Dr. French Ana for the hip and he has had several steroid injections. These helped for a time but they always wear off. He sees Dr. Harrington Challenger regularly.    Review of Systems  Constitutional: Negative.   Respiratory: Negative.   Cardiovascular: Negative.   Musculoskeletal: Positive for arthralgias and gait problem.  Neurological: Negative.        Objective:   Physical Exam  Constitutional: He is oriented to person, place, and time. He appears well-developed and well-nourished.  Using a walker   Cardiovascular: Normal rate, regular rhythm, normal heart sounds and intact distal pulses.   Pulmonary/Chest: Effort normal and breath sounds normal.  Neurological: He is alert and oriented to person, place, and time.          Assessment & Plan:  He is doing well for a cardiac perspective. I advised him to take care of is dental issues ASAP, then he can focus on his hip and back.

## 2015-05-25 NOTE — Progress Notes (Signed)
Pre visit review using our clinic review tool, if applicable. No additional management support is needed unless otherwise documented below in the visit note. 

## 2015-06-01 ENCOUNTER — Ambulatory Visit (AMBULATORY_SURGERY_CENTER): Payer: Self-pay | Admitting: *Deleted

## 2015-06-01 VITALS — Ht 68.25 in | Wt 288.0 lb

## 2015-06-01 DIAGNOSIS — Z86010 Personal history of colonic polyps: Secondary | ICD-10-CM

## 2015-06-01 MED ORDER — NA SULFATE-K SULFATE-MG SULF 17.5-3.13-1.6 GM/177ML PO SOLN: ORAL | Status: DC

## 2015-06-01 NOTE — Progress Notes (Signed)
Patient denies any allergies to eggs or soy. Patient denies any problems with anesthesia/sedation. Patient denies any oxygen use at home and does not take any diet/weight loss medications. Patient declined EMMI education. 

## 2015-06-09 ENCOUNTER — Encounter: Payer: Self-pay | Admitting: Gastroenterology

## 2015-06-09 ENCOUNTER — Ambulatory Visit (AMBULATORY_SURGERY_CENTER): Payer: PPO | Admitting: Gastroenterology

## 2015-06-09 VITALS — BP 131/81 | HR 65 | Temp 97.5°F | Resp 31 | Ht 68.25 in | Wt 288.0 lb

## 2015-06-09 DIAGNOSIS — Z86010 Personal history of colonic polyps: Secondary | ICD-10-CM

## 2015-06-09 DIAGNOSIS — G4733 Obstructive sleep apnea (adult) (pediatric): Secondary | ICD-10-CM | POA: Diagnosis not present

## 2015-06-09 DIAGNOSIS — I4891 Unspecified atrial fibrillation: Secondary | ICD-10-CM | POA: Diagnosis not present

## 2015-06-09 DIAGNOSIS — D122 Benign neoplasm of ascending colon: Secondary | ICD-10-CM

## 2015-06-09 DIAGNOSIS — I10 Essential (primary) hypertension: Secondary | ICD-10-CM | POA: Diagnosis not present

## 2015-06-09 HISTORY — PX: COLONOSCOPY: SHX174

## 2015-06-09 MED ORDER — SODIUM CHLORIDE 0.9 % IV SOLN: 500.0000 mL | INTRAVENOUS | Status: DC

## 2015-06-09 NOTE — Patient Instructions (Signed)
Discharge instructions given. Handouts on polyps and hemorrhoids. Resume Coumadin tomorrow. Hold aspirin AND ANYTHING CONTAINING ASPIRIN FOR THE NEXT 2 WEEKS. YOU HAD AN ENDOSCOPIC PROCEDURE TODAY AT [facility]:   Refer to the procedure report that was given to you for any specific questions about what was found during the examination.  If the procedure report does not answer your questions, please call your gastroenterologist to clarify.  If you requested that your care partner not be given the details of your procedure findings, then the procedure report has been included in a sealed envelope for you to review at your convenience later.  YOU SHOULD EXPECT: Some feelings of bloating in the abdomen. Passage of more gas than usual.  Walking can help get rid of the air that was put into your GI tract during the procedure and reduce the bloating. If you had a lower endoscopy (such as a colonoscopy or flexible sigmoidoscopy) you may notice spotting of blood in your stool or on the toilet paper. If you underwent a bowel prep for your procedure, you may not have a normal bowel movement for a few days.  Please Note:  You might notice some irritation and congestion in your nose or some drainage.  This is from the oxygen used during your procedure.  There is no need for concern and it should clear up in a day or so.  SYMPTOMS TO REPORT IMMEDIATELY:   Following lower endoscopy (colonoscopy or flexible sigmoidoscopy):  Excessive amounts of blood in the stool  Significant tenderness or worsening of abdominal pains  Swelling of the abdomen that is new, acute  Fever of 100F or higher   For urgent or emergent issues, a gastroenterologist can be reached at any hour by calling [phone].   DIET: Your first meal following the procedure should be a small meal and then it is ok to progress to your normal diet. Heavy or fried foods are harder to digest and may make you feel nauseous or  bloated.  Likewise, meals heavy in dairy and vegetables can increase bloating.  Drink plenty of fluids but you should avoid alcoholic beverages for 24 hours.  ACTIVITY:  You should plan to take it easy for the rest of today and you should NOT DRIVE or use heavy machinery until tomorrow (because of the sedation medicines used during the test).    FOLLOW UP: Our staff will call the number listed on your records the next business day following your procedure to check on you and address any questions or concerns that you may have regarding the information given to you following your procedure. If we do not reach you, we will leave a message.  However, if you are feeling well and you are not experiencing any problems, there is no need to return our call.  We will assume that you have returned to your regular daily activities without incident.  If any biopsies were taken you will be contacted by phone or by letter within the next 1-3 weeks.  Please call us at [phone] if you have not heard about the biopsies in 3 weeks.    SIGNATURES/CONFIDENTIALITY: You and/or your care partner have signed paperwork which will be entered into your electronic medical record.  These signatures attest to the fact that that the information above on your After Visit Summary has been reviewed and is understood.  Full responsibility of the confidentiality of this discharge information lies with you and/or your care-partner.

## 2015-06-09 NOTE — Progress Notes (Signed)
Report to PACU, RN, vss, BBS= Clear.  

## 2015-06-09 NOTE — Progress Notes (Signed)
Called to room to assist during endoscopic procedure.  Patient ID and intended procedure confirmed with present staff. Received instructions for my participation in the procedure from the performing physician.  

## 2015-06-09 NOTE — Op Note (Signed)
[place] Patient Name: [name] Procedure Date: 06/09/2015 11:11 AM MRN: [id number] Endoscopist: Ladene Artist , MD Age: 65 Referring MD:  Date of Birth: 07/07/1950 Gender: Male Procedure:                Colonoscopy Indications:              Surveillance: Personal history of adenomatous                            polyps on last colonoscopy > 5 years ago Medicines:                Monitored Anesthesia Care Procedure:                Pre-Anesthesia Assessment:                           - Prior to the procedure, a History and Physical                            was performed, and patient medications and                            allergies were reviewed. The patient's tolerance of                            previous anesthesia was also reviewed. The risks                            and benefits of the procedure and the sedation                            options and risks were discussed with the patient.                            All questions were answered, and informed consent                            was obtained. Prior Anticoagulants: The patient has                            taken Coumadin (warfarin), last dose was 5 days                            prior to procedure. ASA Grade Assessment: III - A                            patient with severe systemic disease. After                            reviewing the risks and benefits, the patient was                            deemed in satisfactory condition to undergo the  procedure.                           After obtaining informed consent, the colonoscope                            was passed under direct vision. Throughout the                            procedure, the patient's blood pressure, pulse, and                            oxygen saturations were monitored continuously. The                            Model PCF-H190L [id number]) scope was introduced   through the anus and advanced to the the cecum,                            identified by appendiceal orifice and ileocecal                            valve. The colonoscopy was performed without                            difficulty. The patient tolerated the procedure                            well. The quality of the bowel preparation was                            good. The ileocecal valve, appendiceal orifice, and                            rectum were photographed. Scope In: 11:15:25 AM Scope Out: 11:27:47 AM Scope Withdrawal Time: 0 hours 10 minutes 27 seconds  Total Procedure Duration: 0 hours 12 minutes 22 seconds  Findings:      The digital rectal exam was normal.      A 6 mm polyp was found in the ascending colon. The polyp was sessile.       The polyp was removed with a cold snare. Resection and retrieval were       complete.      Anal papilla(e) were hypertrophied found during retroflexion.      Internal hemorrhoids were found during retroflexion. The hemorrhoids       were small and Grade I (internal hemorrhoids that do not prolapse).      The exam was otherwise normal throughout the examined colon.      No additional abnormalities were found on retroflexion. Complications:            No immediate complications. Estimated Blood Loss:     Estimated blood loss was minimal. Impression:               - One 6 mm polyp in the ascending colon, removed  with a cold snare. Resected and retrieved.                           - Anal papilla(e) were hypertrophied.                           - Internal hemorrhoids. Recommendation:           - Patient has a contact number available for                            emergencies. The signs and symptoms of potential                            delayed complications were discussed with the                            patient. Return to normal activities tomorrow.                            Written discharge instructions  were provided to the                            patient.                           - Resume previous diet.                           - Resume Coumadin (warfarin) at prior dose                            tomorrow. Refer to managing physician for further                            adjustment of therapy.                           - Await pathology results.                           - No aspirin, ibuprofen, naproxen, or other                            non-steroidal anti-inflammatory drugs for 2 weeks                            after polyp removal.                           - Repeat colonoscopy in 5 years for surveillance. Ladene Artist, MD 06/09/2015 11:34:07 AM This report has been signed electronically. Number of Addenda: 0 Referring MD:      Ishmael Holter. Sarajane Jews, MD

## 2015-06-10 ENCOUNTER — Telehealth: Payer: Self-pay | Admitting: *Deleted

## 2015-06-10 NOTE — Telephone Encounter (Signed)
  Follow up Call-  Call back number 06/09/2015  Post procedure Call Back phone  # [phone]  Permission to leave phone message Yes     Patient questions:  Do you have a fever, pain , or abdominal swelling? No. Pain Score  0 *  Have you tolerated food without any problems? Yes.    Have you been able to return to your normal activities? Yes.    Do you have any questions about your discharge instructions: Diet   No. Medications  No. Follow up visit  No.  Do you have questions or concerns about your Care? No.  Actions: * If pain score is 4 or above: No action needed, pain <4.

## 2015-06-15 ENCOUNTER — Encounter: Payer: Self-pay | Admitting: Gastroenterology

## 2015-06-19 ENCOUNTER — Other Ambulatory Visit: Payer: Self-pay | Admitting: Family Medicine

## 2015-06-19 ENCOUNTER — Ambulatory Visit (INDEPENDENT_AMBULATORY_CARE_PROVIDER_SITE_OTHER): Payer: PPO

## 2015-06-19 DIAGNOSIS — I482 Chronic atrial fibrillation, unspecified: Secondary | ICD-10-CM

## 2015-06-19 DIAGNOSIS — Z7901 Long term (current) use of anticoagulants: Secondary | ICD-10-CM

## 2015-06-19 DIAGNOSIS — I4891 Unspecified atrial fibrillation: Secondary | ICD-10-CM

## 2015-06-19 DIAGNOSIS — Z5181 Encounter for therapeutic drug level monitoring: Secondary | ICD-10-CM

## 2015-06-19 LAB — POCT INR: INR: 1.8

## 2015-07-01 ENCOUNTER — Ambulatory Visit (INDEPENDENT_AMBULATORY_CARE_PROVIDER_SITE_OTHER): Payer: PPO | Admitting: *Deleted

## 2015-07-01 DIAGNOSIS — I482 Chronic atrial fibrillation, unspecified: Secondary | ICD-10-CM

## 2015-07-01 DIAGNOSIS — Z7901 Long term (current) use of anticoagulants: Secondary | ICD-10-CM

## 2015-07-01 DIAGNOSIS — Z5181 Encounter for therapeutic drug level monitoring: Secondary | ICD-10-CM | POA: Diagnosis not present

## 2015-07-01 DIAGNOSIS — I4891 Unspecified atrial fibrillation: Secondary | ICD-10-CM

## 2015-07-01 LAB — POCT INR: INR: 2.6

## 2015-07-10 ENCOUNTER — Other Ambulatory Visit (INDEPENDENT_AMBULATORY_CARE_PROVIDER_SITE_OTHER): Payer: PPO

## 2015-07-10 DIAGNOSIS — I10 Essential (primary) hypertension: Secondary | ICD-10-CM | POA: Diagnosis not present

## 2015-07-10 LAB — POC URINALSYSI DIPSTICK (AUTOMATED)
BILIRUBIN UA: NEGATIVE
Blood, UA: NEGATIVE
Glucose, UA: NEGATIVE
KETONES UA: NEGATIVE
Leukocytes, UA: NEGATIVE
Nitrite, UA: NEGATIVE
PROTEIN UA: NEGATIVE
SPEC GRAV UA: 1.015
Urobilinogen, UA: 0.2
pH, UA: 5

## 2015-07-10 LAB — CBC WITH DIFFERENTIAL/PLATELET
Basophils Absolute: 0 10*3/uL (ref 0.0–0.1)
Basophils Relative: 0.4 % (ref 0.0–3.0)
EOS PCT: 2 % (ref 0.0–5.0)
Eosinophils Absolute: 0.1 10*3/uL (ref 0.0–0.7)
HCT: 41.1 % (ref 39.0–52.0)
Hemoglobin: 14.2 g/dL (ref 13.0–17.0)
LYMPHS ABS: 2 10*3/uL (ref 0.7–4.0)
Lymphocytes Relative: 27.6 % (ref 12.0–46.0)
MCHC: 34.6 g/dL (ref 30.0–36.0)
MCV: 91.8 fl (ref 78.0–100.0)
MONO ABS: 0.6 10*3/uL (ref 0.1–1.0)
Monocytes Relative: 7.8 % (ref 3.0–12.0)
NEUTROS ABS: 4.5 10*3/uL (ref 1.4–7.7)
NEUTROS PCT: 62.2 % (ref 43.0–77.0)
PLATELETS: 205 10*3/uL (ref 150.0–400.0)
RBC: 4.47 Mil/uL (ref 4.22–5.81)
RDW: 13.7 % (ref 11.5–15.5)
WBC: 7.3 10*3/uL (ref 4.0–10.5)

## 2015-07-10 LAB — LIPID PANEL
CHOLESTEROL: 190 mg/dL (ref 0–200)
HDL: 34.3 mg/dL — ABNORMAL LOW (ref >39.00)
LDL CALC: 131 mg/dL — AB (ref 0–99)
NonHDL: 155.56
Total CHOL/HDL Ratio: 6
Triglycerides: 124 mg/dL (ref 0.0–149.0)
VLDL: 24.8 mg/dL (ref 0.0–40.0)

## 2015-07-10 LAB — HEPATIC FUNCTION PANEL
ALBUMIN: 4.1 g/dL (ref 3.5–5.2)
ALT: 24 U/L (ref 0–53)
AST: 22 U/L (ref 0–37)
Alkaline Phosphatase: 100 U/L (ref 39–117)
Bilirubin, Direct: 0.1 mg/dL (ref 0.0–0.3)
TOTAL PROTEIN: 6.7 g/dL (ref 6.0–8.3)
Total Bilirubin: 0.5 mg/dL (ref 0.2–1.2)

## 2015-07-10 LAB — BASIC METABOLIC PANEL
BUN: 20 mg/dL (ref 6–23)
CALCIUM: 11.2 mg/dL — AB (ref 8.4–10.5)
CO2: 28 mEq/L (ref 19–32)
CREATININE: 1.03 mg/dL (ref 0.40–1.50)
Chloride: 101 mEq/L (ref 96–112)
GFR: 76.91 mL/min (ref >60.00)
GLUCOSE: 114 mg/dL — AB (ref 70–99)
Potassium: 4 mEq/L (ref 3.5–5.1)
SODIUM: 137 meq/L (ref 135–145)

## 2015-07-10 LAB — TSH: TSH: 2.96 u[IU]/mL (ref 0.35–4.50)

## 2015-07-10 LAB — PSA: PSA: 0.19 ng/mL (ref 0.10–4.00)

## 2015-07-17 ENCOUNTER — Ambulatory Visit (INDEPENDENT_AMBULATORY_CARE_PROVIDER_SITE_OTHER): Payer: PPO | Admitting: Family Medicine

## 2015-07-17 ENCOUNTER — Encounter: Payer: Self-pay | Admitting: Family Medicine

## 2015-07-17 VITALS — BP 128/76 | HR 74 | Temp 98.6°F | Ht 68.25 in | Wt 289.0 lb

## 2015-07-17 DIAGNOSIS — I10 Essential (primary) hypertension: Secondary | ICD-10-CM

## 2015-07-17 DIAGNOSIS — I482 Chronic atrial fibrillation, unspecified: Secondary | ICD-10-CM

## 2015-07-17 DIAGNOSIS — K5909 Other constipation: Secondary | ICD-10-CM

## 2015-07-17 DIAGNOSIS — E785 Hyperlipidemia, unspecified: Secondary | ICD-10-CM | POA: Diagnosis not present

## 2015-07-17 DIAGNOSIS — E669 Obesity, unspecified: Secondary | ICD-10-CM

## 2015-07-17 DIAGNOSIS — M25551 Pain in right hip: Secondary | ICD-10-CM

## 2015-07-17 MED ORDER — MELOXICAM 15 MG PO TABS: 15.0000 mg | ORAL_TABLET | Freq: Every day | ORAL | Status: DC

## 2015-07-17 NOTE — Progress Notes (Signed)
   Subjective:    Patient ID: [name], male    DOB: [birth year], 65 y.o.   MRN: [id number]  HPI Here to go over labs results and to follow up. He feels well in general except for joint pains, primarily hips and knees. He has been taking 2 Aleve every day, but Dr. Fuller Plan expressed concern about this during his recent colonoscopy. Of course he has been on Coumadin for some time now. His labs are remarkable for high cholesterol and an elevated glucose. He cannot tolerate statins so is taking Ezetimebe. He tries to avoid sweets and desserts, but he admits to eating a lot of breads and potatoes.    Review of Systems  Constitutional: Negative.   Respiratory: Negative.   Cardiovascular: Negative.   Gastrointestinal: Positive for constipation. Negative for nausea, vomiting, abdominal pain, diarrhea, blood in stool, abdominal distention, anal bleeding and rectal pain.  Endocrine: Negative.   Genitourinary: Negative.   Musculoskeletal: Positive for arthralgias and gait problem.  Neurological: Negative.        Objective:   Physical Exam  Constitutional: He is oriented to person, place, and time. He appears well-developed and well-nourished.  Walks with a walker   Neck: No thyromegaly present.  Cardiovascular: Normal rate, regular rhythm, normal heart sounds and intact distal pulses.   Pulmonary/Chest: Effort normal and breath sounds normal.  Lymphadenopathy:    He has no cervical adenopathy.  Neurological: He is alert and oriented to person, place, and time.          Assessment & Plan:  His HTN is stable. We discussed his diet. He is already watching his intake of fatty foods, but I stressed that he needs to reduce his carbohydrate intake as well. Exercise is very difficult for him to do. For his joint pains, we will stop the Aleve and try Meloxicam daily.I think this will be a little safer for him to use over time. His atrial fibrillation is stable. His bowels move fairly regularly as long  as he takes Bisacodyl daily.  [name], MD

## 2015-07-17 NOTE — Progress Notes (Signed)
Pre visit review using our clinic review tool, if applicable. No additional management support is needed unless otherwise documented below in the visit note. 

## 2015-07-29 ENCOUNTER — Ambulatory Visit (INDEPENDENT_AMBULATORY_CARE_PROVIDER_SITE_OTHER): Payer: PPO | Admitting: *Deleted

## 2015-07-29 DIAGNOSIS — Z5181 Encounter for therapeutic drug level monitoring: Secondary | ICD-10-CM

## 2015-07-29 DIAGNOSIS — I482 Chronic atrial fibrillation, unspecified: Secondary | ICD-10-CM

## 2015-07-29 DIAGNOSIS — I4891 Unspecified atrial fibrillation: Secondary | ICD-10-CM

## 2015-07-29 DIAGNOSIS — Z7901 Long term (current) use of anticoagulants: Secondary | ICD-10-CM | POA: Diagnosis not present

## 2015-07-29 LAB — POCT INR: INR: 2.6

## 2015-08-04 ENCOUNTER — Other Ambulatory Visit: Payer: Self-pay | Admitting: Family Medicine

## 2015-08-26 ENCOUNTER — Other Ambulatory Visit: Payer: Self-pay | Admitting: Internal Medicine

## 2015-08-28 ENCOUNTER — Ambulatory Visit (INDEPENDENT_AMBULATORY_CARE_PROVIDER_SITE_OTHER): Payer: PPO | Admitting: Internal Medicine

## 2015-08-28 ENCOUNTER — Ambulatory Visit (INDEPENDENT_AMBULATORY_CARE_PROVIDER_SITE_OTHER): Payer: PPO | Admitting: *Deleted

## 2015-08-28 ENCOUNTER — Encounter: Payer: Self-pay | Admitting: Internal Medicine

## 2015-08-28 VITALS — BP 126/84 | HR 65 | Ht 70.0 in | Wt 290.8 lb

## 2015-08-28 DIAGNOSIS — I482 Chronic atrial fibrillation, unspecified: Secondary | ICD-10-CM

## 2015-08-28 DIAGNOSIS — I4891 Unspecified atrial fibrillation: Secondary | ICD-10-CM

## 2015-08-28 DIAGNOSIS — Z5181 Encounter for therapeutic drug level monitoring: Secondary | ICD-10-CM

## 2015-08-28 DIAGNOSIS — Z7901 Long term (current) use of anticoagulants: Secondary | ICD-10-CM | POA: Diagnosis not present

## 2015-08-28 DIAGNOSIS — I10 Essential (primary) hypertension: Secondary | ICD-10-CM

## 2015-08-28 DIAGNOSIS — E785 Hyperlipidemia, unspecified: Secondary | ICD-10-CM

## 2015-08-28 LAB — POCT INR: INR: 2.6

## 2015-08-28 NOTE — Patient Instructions (Addendum)
Medication Instructions:  Your physician recommends that you continue on your current medications as directed. Please refer to the Current Medication list given to you today.  Labwork: none  Testing/Procedures: none  Follow-Up: Your physician wants you to follow-up in: February  You will receive a reminder letter in the mail two months in advance. If you don't receive a letter, please call our office to schedule the follow-up appointment.  Any Other Special Instructions Will Be Listed Below (If Applicable). Call Dr Teena Dunk [phone]  If you need a refill on your cardiac medications before your next appointment, please call your pharmacy.

## 2015-08-28 NOTE — Progress Notes (Signed)
Cardiology Office Note   Date:  08/28/2015   ID:  [name], DOB 07/21/1950, MRN [id number]  PCP:  [name], MD  Cardiologist:   [name], MD   F/U of atrial fib and MV dz      History of Present Illness: [name] is a 65 y.o. male with a history ofmitral regurgitation, HTN, afib, sleep apnea, morbid obesity I saw him in November 2016 Since seen he has done ok from a cardiac standpoint  Breathing is OK  No CP  No palpitations  No dizziness        Outpatient Prescriptions Prior to Visit  Medication Sig Dispense Refill  . amLODipine (NORVASC) 10 MG tablet Take 1 tablet (10 mg total) by mouth daily. 30 tablet 11  . aspirin 81 MG tablet Take 81 mg by mouth daily.      . bisacodyl (DULCOLAX) 5 MG EC tablet Take 10 mg by mouth daily as needed.    . carvedilol (COREG) 25 MG tablet TAKE 1 TABLET (25 MG TOTAL) BY MOUTH 2 (TWO) TIMES DAILY. 60 tablet 9  . cloNIDine (CATAPRES) 0.2 MG tablet TAKE 1 TABLET (0.2 MG TOTAL) BY MOUTH 2 (TWO) TIMES DAILY. 60 tablet 11  . doxazosin (CARDURA) 4 MG tablet Take 1 tablet (4 mg total) by mouth daily. 30 tablet 11  . ezetimibe (ZETIA) 10 MG tablet TAKE 1 TABLET (10 MG TOTAL) BY MOUTH DAILY. 90 tablet 3  . fish oil-omega-3 fatty acids 1000 MG capsule Take 1 g by mouth daily.      . hydrochlorothiazide (HYDRODIURIL) 25 MG tablet TAKE 1 TABLET (25 MG TOTAL) BY MOUTH DAILY  (NEW DOSE) 30 tablet 11  . losartan (COZAAR) 100 MG tablet TAKE 1 TABLET (100 MG TOTAL) BY MOUTH DAILY. 30 tablet 11  . meloxicam (MOBIC) 15 MG tablet Take 1 tablet (15 mg total) by mouth daily. 90 tablet 3  . VITAMIN D, CHOLECALCIFEROL, PO Take 5,000 Units by mouth daily.    Marland Kitchen warfarin (COUMADIN) 5 MG tablet TAKE AS DIRECTED BY ANTICOAGULATION CLINIC (60 TABLETS IS 30 DAY SUPPLY) 60 tablet 3   No facility-administered medications prior to visit.     Allergies:   Statins and Penicillins   Past Medical History  Diagnosis Date  . Obesity   . Hypertension   . Heart  valve problem     Dr [name]  . Atrial fibrillation [facility])     sees Dr. [name]   . Other and unspecified hyperlipidemia   . Mitral valve disorders   . OSA (obstructive sleep apnea)     cpap  . Adenomatous colon polyp 07/2003  . Sleep apnea     Past Surgical History  Procedure Laterality Date  . Colonoscopy  06-09-15    per Dr. Fuller Plan, adenomatous polyp, repeat in 5 yrs   . Lumbar laminectomy/decompression microdiscectomy N/A 11/21/2012    Procedure: Lumbar Three to Lumbar Five Laminectomy;  Surgeon: Floyce Stakes, MD;  Location: [facility];  Service: Neurosurgery;  Laterality: N/A;  Lumbar Three to Lumbar Five Laminectomy  . Foot surgery  at age 50     Social History:  The patient  reports that he has never smoked. He has never used smokeless tobacco. He reports that he does not drink alcohol or use illicit drugs.   Family History:  The patient's family history includes Alzheimer's disease in his maternal aunt and mother; Heart disease in his father; Hypertension in his father; Stroke in  his father. There is no history of Colon cancer.    ROS:  Please see the history of present illness. All other systems are reviewed and  Negative to the above problem except as noted.    PHYSICAL EXAM: VS:  BP 126/84 mmHg  Pulse 65  Ht 5\' 10"  (1.778 m)  Wt 290 lb 12.8 oz (131.906 kg)  BMI 41.73 kg/m2  GEN: Morbidly obese 65 yo, in no acute distress HEENT: normal Neck: no JVD, carotid bruits, or masses Cardiac: RRR; Gr II/VI systolic murmur at base  rubs, or gallops,no edema  Respiratory:  clear to auscultation bilaterally, normal work of breathing GI: soft, nontender, nondistended, + BS  No hepatomegaly  MS: no deformity Moving all extremities   Skin: warm and dry, no rash Neuro:  Strength and sensation are intact Psych: euthymic mood, full affect   EKG:  EKG is ordered today.   Lipid Panel    Component Value Date/Time   CHOL 190 07/10/2015 0851   TRIG 124.0 07/10/2015  0851   HDL 34.30* 07/10/2015 0851   CHOLHDL 6 07/10/2015 0851   VLDL 24.8 07/10/2015 0851   LDLCALC 131* 07/10/2015 0851   LDLDIRECT 168.0 03/25/2014 0844      Wt Readings from Last 3 Encounters:  08/28/15 290 lb 12.8 oz (131.906 kg)  07/17/15 289 lb (131.09 kg)  06/09/15 288 lb (130.636 kg)      ASSESSMENT AND PLAN:  1.  Atrial fibrillation  Continue with rate control and coumadin  2  HL  Intolerant to statins  Does not want to try again  Continue with Zetia and Omega 3 FA    3  Mitral regurgitation  Follow with periodic echoes  Eccentric.   4  HTN  Adequate cotnrol      F/U in Feb      Signed, [name], MD  08/28/2015 4:11 PM    [facility] [place], [place], [place]  [address] Phone: [phone]; Fax: [phone]

## 2015-09-23 ENCOUNTER — Encounter (INDEPENDENT_AMBULATORY_CARE_PROVIDER_SITE_OTHER): Payer: Self-pay

## 2015-09-23 ENCOUNTER — Ambulatory Visit (INDEPENDENT_AMBULATORY_CARE_PROVIDER_SITE_OTHER): Payer: PPO | Admitting: *Deleted

## 2015-09-23 DIAGNOSIS — I4891 Unspecified atrial fibrillation: Secondary | ICD-10-CM

## 2015-09-23 DIAGNOSIS — Z7901 Long term (current) use of anticoagulants: Secondary | ICD-10-CM

## 2015-09-23 DIAGNOSIS — I482 Chronic atrial fibrillation, unspecified: Secondary | ICD-10-CM

## 2015-09-23 DIAGNOSIS — Z5181 Encounter for therapeutic drug level monitoring: Secondary | ICD-10-CM

## 2015-09-23 LAB — POCT INR: INR: 3.1

## 2015-10-21 ENCOUNTER — Ambulatory Visit (INDEPENDENT_AMBULATORY_CARE_PROVIDER_SITE_OTHER): Payer: PPO | Admitting: *Deleted

## 2015-10-21 DIAGNOSIS — I4891 Unspecified atrial fibrillation: Secondary | ICD-10-CM | POA: Diagnosis not present

## 2015-10-21 DIAGNOSIS — Z7901 Long term (current) use of anticoagulants: Secondary | ICD-10-CM

## 2015-10-21 DIAGNOSIS — Z5181 Encounter for therapeutic drug level monitoring: Secondary | ICD-10-CM

## 2015-10-21 LAB — POCT INR: INR: 2.6

## 2015-11-19 ENCOUNTER — Ambulatory Visit (INDEPENDENT_AMBULATORY_CARE_PROVIDER_SITE_OTHER): Payer: PPO | Admitting: *Deleted

## 2015-11-19 DIAGNOSIS — Z7901 Long term (current) use of anticoagulants: Secondary | ICD-10-CM

## 2015-11-19 DIAGNOSIS — I4891 Unspecified atrial fibrillation: Secondary | ICD-10-CM | POA: Diagnosis not present

## 2015-11-19 DIAGNOSIS — Z5181 Encounter for therapeutic drug level monitoring: Secondary | ICD-10-CM

## 2015-11-19 LAB — POCT INR: INR: 2.7

## 2015-12-16 ENCOUNTER — Ambulatory Visit (INDEPENDENT_AMBULATORY_CARE_PROVIDER_SITE_OTHER): Payer: PPO | Admitting: *Deleted

## 2015-12-16 DIAGNOSIS — I4891 Unspecified atrial fibrillation: Secondary | ICD-10-CM

## 2015-12-16 DIAGNOSIS — Z5181 Encounter for therapeutic drug level monitoring: Secondary | ICD-10-CM

## 2015-12-16 DIAGNOSIS — Z7901 Long term (current) use of anticoagulants: Secondary | ICD-10-CM | POA: Diagnosis not present

## 2015-12-16 LAB — POCT INR: INR: 2.9

## 2016-01-10 ENCOUNTER — Other Ambulatory Visit: Payer: Self-pay | Admitting: Internal Medicine

## 2016-01-15 ENCOUNTER — Other Ambulatory Visit: Payer: Self-pay | Admitting: Family Medicine

## 2016-01-23 ENCOUNTER — Other Ambulatory Visit: Payer: Self-pay | Admitting: Internal Medicine

## 2016-01-27 ENCOUNTER — Ambulatory Visit (INDEPENDENT_AMBULATORY_CARE_PROVIDER_SITE_OTHER): Payer: PPO | Admitting: *Deleted

## 2016-01-27 DIAGNOSIS — I4891 Unspecified atrial fibrillation: Secondary | ICD-10-CM | POA: Diagnosis not present

## 2016-01-27 DIAGNOSIS — Z7901 Long term (current) use of anticoagulants: Secondary | ICD-10-CM | POA: Diagnosis not present

## 2016-01-27 DIAGNOSIS — Z5181 Encounter for therapeutic drug level monitoring: Secondary | ICD-10-CM

## 2016-01-27 LAB — POCT INR: INR: 3.2

## 2016-01-29 ENCOUNTER — Other Ambulatory Visit: Payer: Self-pay | Admitting: Internal Medicine

## 2016-02-10 ENCOUNTER — Ambulatory Visit (INDEPENDENT_AMBULATORY_CARE_PROVIDER_SITE_OTHER): Payer: PPO | Admitting: *Deleted

## 2016-02-10 DIAGNOSIS — I4891 Unspecified atrial fibrillation: Secondary | ICD-10-CM

## 2016-02-10 DIAGNOSIS — Z5181 Encounter for therapeutic drug level monitoring: Secondary | ICD-10-CM

## 2016-02-10 DIAGNOSIS — Z7901 Long term (current) use of anticoagulants: Secondary | ICD-10-CM | POA: Diagnosis not present

## 2016-02-10 LAB — POCT INR: INR: 3.1

## 2016-03-02 ENCOUNTER — Ambulatory Visit (INDEPENDENT_AMBULATORY_CARE_PROVIDER_SITE_OTHER): Payer: PPO | Admitting: *Deleted

## 2016-03-02 DIAGNOSIS — Z5181 Encounter for therapeutic drug level monitoring: Secondary | ICD-10-CM | POA: Diagnosis not present

## 2016-03-02 DIAGNOSIS — Z7901 Long term (current) use of anticoagulants: Secondary | ICD-10-CM

## 2016-03-02 DIAGNOSIS — I4891 Unspecified atrial fibrillation: Secondary | ICD-10-CM | POA: Diagnosis not present

## 2016-03-02 LAB — POCT INR: INR: 2.8

## 2016-03-16 ENCOUNTER — Other Ambulatory Visit: Payer: Self-pay | Admitting: Family Medicine

## 2016-03-30 ENCOUNTER — Ambulatory Visit (INDEPENDENT_AMBULATORY_CARE_PROVIDER_SITE_OTHER): Payer: PPO | Admitting: Pharmacist

## 2016-03-30 DIAGNOSIS — Z5181 Encounter for therapeutic drug level monitoring: Secondary | ICD-10-CM

## 2016-03-30 DIAGNOSIS — Z7901 Long term (current) use of anticoagulants: Secondary | ICD-10-CM | POA: Diagnosis not present

## 2016-03-30 DIAGNOSIS — I4891 Unspecified atrial fibrillation: Secondary | ICD-10-CM

## 2016-03-30 LAB — POCT INR: INR: 3.1

## 2016-04-20 ENCOUNTER — Ambulatory Visit (INDEPENDENT_AMBULATORY_CARE_PROVIDER_SITE_OTHER): Payer: PPO | Admitting: *Deleted

## 2016-04-20 DIAGNOSIS — Z5181 Encounter for therapeutic drug level monitoring: Secondary | ICD-10-CM | POA: Diagnosis not present

## 2016-04-20 DIAGNOSIS — Z7901 Long term (current) use of anticoagulants: Secondary | ICD-10-CM

## 2016-04-20 DIAGNOSIS — I4891 Unspecified atrial fibrillation: Secondary | ICD-10-CM

## 2016-04-20 LAB — POCT INR: INR: 2.3

## 2016-05-10 ENCOUNTER — Other Ambulatory Visit: Payer: Self-pay | Admitting: Family Medicine

## 2016-05-11 ENCOUNTER — Ambulatory Visit (INDEPENDENT_AMBULATORY_CARE_PROVIDER_SITE_OTHER): Payer: PPO | Admitting: *Deleted

## 2016-05-11 DIAGNOSIS — Z7901 Long term (current) use of anticoagulants: Secondary | ICD-10-CM | POA: Diagnosis not present

## 2016-05-11 DIAGNOSIS — I4891 Unspecified atrial fibrillation: Secondary | ICD-10-CM | POA: Diagnosis not present

## 2016-05-11 DIAGNOSIS — Z5181 Encounter for therapeutic drug level monitoring: Secondary | ICD-10-CM | POA: Diagnosis not present

## 2016-05-11 LAB — POCT INR: INR: 2.1

## 2016-05-23 ENCOUNTER — Other Ambulatory Visit: Payer: Self-pay | Admitting: Internal Medicine

## 2016-05-27 ENCOUNTER — Other Ambulatory Visit: Payer: Self-pay | Admitting: Family Medicine

## 2016-06-08 ENCOUNTER — Ambulatory Visit (INDEPENDENT_AMBULATORY_CARE_PROVIDER_SITE_OTHER): Payer: PPO | Admitting: Pharmacist

## 2016-06-08 DIAGNOSIS — Z7901 Long term (current) use of anticoagulants: Secondary | ICD-10-CM

## 2016-06-08 DIAGNOSIS — Z5181 Encounter for therapeutic drug level monitoring: Secondary | ICD-10-CM

## 2016-06-08 DIAGNOSIS — I4891 Unspecified atrial fibrillation: Secondary | ICD-10-CM

## 2016-06-08 LAB — POCT INR: INR: 2.1

## 2016-06-12 ENCOUNTER — Other Ambulatory Visit: Payer: Self-pay | Admitting: Family Medicine

## 2016-06-13 ENCOUNTER — Other Ambulatory Visit: Payer: Self-pay | Admitting: Family Medicine

## 2016-07-06 ENCOUNTER — Encounter: Payer: Self-pay | Admitting: *Deleted

## 2016-07-06 ENCOUNTER — Ambulatory Visit (INDEPENDENT_AMBULATORY_CARE_PROVIDER_SITE_OTHER): Payer: PPO | Admitting: *Deleted

## 2016-07-06 DIAGNOSIS — I4891 Unspecified atrial fibrillation: Secondary | ICD-10-CM | POA: Diagnosis not present

## 2016-07-06 DIAGNOSIS — Z7901 Long term (current) use of anticoagulants: Secondary | ICD-10-CM | POA: Diagnosis not present

## 2016-07-06 DIAGNOSIS — Z5181 Encounter for therapeutic drug level monitoring: Secondary | ICD-10-CM | POA: Diagnosis not present

## 2016-07-06 LAB — POCT INR: INR: 2.8

## 2016-07-10 ENCOUNTER — Other Ambulatory Visit: Payer: Self-pay | Admitting: Family Medicine

## 2016-07-18 ENCOUNTER — Encounter: Payer: Self-pay | Admitting: Family Medicine

## 2016-07-18 ENCOUNTER — Ambulatory Visit (INDEPENDENT_AMBULATORY_CARE_PROVIDER_SITE_OTHER): Payer: PPO | Admitting: Family Medicine

## 2016-07-18 VITALS — BP 136/82 | Temp 98.7°F | Ht 70.0 in | Wt 284.0 lb

## 2016-07-18 DIAGNOSIS — R739 Hyperglycemia, unspecified: Secondary | ICD-10-CM

## 2016-07-18 DIAGNOSIS — Z0000 Encounter for general adult medical examination without abnormal findings: Secondary | ICD-10-CM

## 2016-07-18 LAB — CBC WITH DIFFERENTIAL/PLATELET
BASOS ABS: 0 10*3/uL (ref 0.0–0.1)
Basophils Relative: 0.3 % (ref 0.0–3.0)
EOS ABS: 0.2 10*3/uL (ref 0.0–0.7)
Eosinophils Relative: 2.3 % (ref 0.0–5.0)
HCT: 41 % (ref 39.0–52.0)
Hemoglobin: 14.3 g/dL (ref 13.0–17.0)
LYMPHS ABS: 1.9 10*3/uL (ref 0.7–4.0)
Lymphocytes Relative: 21.7 % (ref 12.0–46.0)
MCHC: 34.8 g/dL (ref 30.0–36.0)
MCV: 92.3 fl (ref 78.0–100.0)
MONOS PCT: 7.1 % (ref 3.0–12.0)
Monocytes Absolute: 0.6 10*3/uL (ref 0.1–1.0)
NEUTROS ABS: 5.9 10*3/uL (ref 1.4–7.7)
NEUTROS PCT: 68.6 % (ref 43.0–77.0)
PLATELETS: 213 10*3/uL (ref 150.0–400.0)
RBC: 4.44 Mil/uL (ref 4.22–5.81)
RDW: 13.6 % (ref 11.5–15.5)
WBC: 8.6 10*3/uL (ref 4.0–10.5)

## 2016-07-18 LAB — POC URINALSYSI DIPSTICK (AUTOMATED)
Bilirubin, UA: NEGATIVE
Glucose, UA: NEGATIVE
KETONES UA: NEGATIVE
LEUKOCYTES UA: NEGATIVE
Nitrite, UA: NEGATIVE
PH UA: 6 (ref 5.0–8.0)
PROTEIN UA: NEGATIVE
RBC UA: NEGATIVE
Spec Grav, UA: 1.025 (ref 1.010–1.025)
Urobilinogen, UA: 0.2 E.U./dL

## 2016-07-18 LAB — LIPID PANEL
CHOLESTEROL: 190 mg/dL (ref 0–200)
HDL: 36.8 mg/dL — ABNORMAL LOW (ref >39.00)
LDL CALC: 133 mg/dL — AB (ref 0–99)
NonHDL: 152.98
TRIGLYCERIDES: 99 mg/dL (ref 0.0–149.0)
Total CHOL/HDL Ratio: 5
VLDL: 19.8 mg/dL (ref 0.0–40.0)

## 2016-07-18 LAB — TSH: TSH: 1.81 u[IU]/mL (ref 0.35–4.50)

## 2016-07-18 LAB — BASIC METABOLIC PANEL
BUN: 26 mg/dL — ABNORMAL HIGH (ref 6–23)
CHLORIDE: 103 meq/L (ref 96–112)
CO2: 27 mEq/L (ref 19–32)
Calcium: 11.2 mg/dL — ABNORMAL HIGH (ref 8.4–10.5)
Creatinine, Ser: 0.97 mg/dL (ref 0.40–1.50)
GFR: 82.17 mL/min (ref >60.00)
Glucose, Bld: 110 mg/dL — ABNORMAL HIGH (ref 70–99)
POTASSIUM: 4 meq/L (ref 3.5–5.1)
Sodium: 138 mEq/L (ref 135–145)

## 2016-07-18 LAB — HEPATIC FUNCTION PANEL
ALBUMIN: 4.3 g/dL (ref 3.5–5.2)
ALK PHOS: 104 U/L (ref 39–117)
ALT: 20 U/L (ref 0–53)
AST: 17 U/L (ref 0–37)
BILIRUBIN DIRECT: 0.1 mg/dL (ref 0.0–0.3)
BILIRUBIN TOTAL: 0.6 mg/dL (ref 0.2–1.2)
Total Protein: 6.8 g/dL (ref 6.0–8.3)

## 2016-07-18 LAB — HEMOGLOBIN A1C: HEMOGLOBIN A1C: 5.8 % (ref 4.6–6.5)

## 2016-07-18 LAB — PSA: PSA: 0.27 ng/mL (ref 0.10–4.00)

## 2016-07-18 MED ORDER — LUBIPROSTONE 24 MCG PO CAPS: 24.0000 ug | ORAL_CAPSULE | Freq: Two times a day (BID) | ORAL | 5 refills | Status: DC

## 2016-07-18 NOTE — Progress Notes (Signed)
   Subjective:    Patient ID: [name], male    DOB: 03/07/1951, 66 y.o.   MRN: [id number]  HPI 66 yr old male for a well exam. He has 2 main complaints which he has dealt with for years, one is fatigue and the other is constipation. He drinks plenty of water and takes 2-3 Dulcolax tablets a day, with mixed results. He has tried Metamucil and Miralax with no relief. No abdominal pain or nausea. He uses his walker at all times.    Review of Systems  Constitutional: Positive for fatigue. Negative for activity change, appetite change, chills, diaphoresis, fever and unexpected weight change.  HENT: Negative.   Eyes: Negative.   Respiratory: Negative.   Cardiovascular: Negative.   Gastrointestinal: Positive for constipation. Negative for abdominal distention, abdominal pain, anal bleeding, blood in stool, diarrhea, nausea, rectal pain and vomiting.  Genitourinary: Negative.   Musculoskeletal: Negative.   Skin: Negative.   Neurological: Negative.   Psychiatric/Behavioral: Negative.        Objective:   Physical Exam  Constitutional: He is oriented to person, place, and time. No distress.  morbidly obese  HENT:  Head: Normocephalic and atraumatic.  Right Ear: External ear normal.  Left Ear: External ear normal.  Nose: Nose normal.  Mouth/Throat: Oropharynx is clear and moist. No oropharyngeal exudate.  Eyes: Conjunctivae and EOM are normal. Pupils are equal, round, and reactive to light. Right eye exhibits no discharge. Left eye exhibits no discharge. No scleral icterus.  Neck: Neck supple. No JVD present. No tracheal deviation present. No thyromegaly present.  Cardiovascular: Normal rate, regular rhythm, normal heart sounds and intact distal pulses.  Exam reveals no gallop and no friction rub.   No murmur heard. Pulmonary/Chest: Effort normal and breath sounds normal. No respiratory distress. He has no wheezes. He has no rales. He exhibits no tenderness.  Abdominal: Soft. Bowel sounds  are normal. He exhibits no distension and no mass. There is no tenderness. There is no rebound and no guarding.  Genitourinary: Rectum normal, prostate normal and penis normal. Rectal exam shows guaiac negative stool. No penile tenderness.  Musculoskeletal: Normal range of motion. He exhibits no edema or tenderness.  Lymphadenopathy:    He has no cervical adenopathy.  Neurological: He is alert and oriented to person, place, and time. He has normal reflexes. No cranial nerve deficit. He exhibits normal muscle tone. Coordination normal.  Skin: Skin is warm and dry. No rash noted. He is not diaphoretic. No erythema. No pallor.  Psychiatric: He has a normal mood and affect. His behavior is normal. Judgment and thought content normal.          Assessment & Plan:  Well exam. We discussed diet and exercise. Get fasting labs. Try Amitiza 24 mcg bid for the constipation. He will see Dr. Harrington Challenger next week.  Alysia Penna, MD

## 2016-07-18 NOTE — Patient Instructions (Signed)
WE NOW OFFER   [name]'s FAST TRACK!!!  SAME DAY Appointments for ACUTE CARE  Such as: Sprains, Injuries, cuts, abrasions, rashes, muscle pain, joint pain, back pain Colds, flu, sore throats, headache, allergies, cough, fever  Ear pain, sinus and eye infections Abdominal pain, nausea, vomiting, diarrhea, upset stomach Animal/insect bites  3 Easy Ways to Schedule: Walk-In Scheduling Call in scheduling Mychart Sign-up: [email or web address]         

## 2016-07-21 ENCOUNTER — Encounter: Payer: Self-pay | Admitting: Family Medicine

## 2016-07-21 ENCOUNTER — Ambulatory Visit (INDEPENDENT_AMBULATORY_CARE_PROVIDER_SITE_OTHER): Payer: PPO | Admitting: Family Medicine

## 2016-07-21 VITALS — BP 136/80 | Temp 97.7°F | Ht 70.0 in | Wt 284.0 lb

## 2016-07-21 DIAGNOSIS — R7303 Prediabetes: Secondary | ICD-10-CM | POA: Insufficient documentation

## 2016-07-21 DIAGNOSIS — I481 Persistent atrial fibrillation: Secondary | ICD-10-CM

## 2016-07-21 DIAGNOSIS — I10 Essential (primary) hypertension: Secondary | ICD-10-CM | POA: Diagnosis not present

## 2016-07-21 DIAGNOSIS — E785 Hyperlipidemia, unspecified: Secondary | ICD-10-CM | POA: Diagnosis not present

## 2016-07-21 DIAGNOSIS — I4819 Other persistent atrial fibrillation: Secondary | ICD-10-CM

## 2016-07-21 MED ORDER — MELOXICAM 15 MG PO TABS: ORAL_TABLET | ORAL | 3 refills | Status: DC

## 2016-07-21 MED ORDER — CARVEDILOL 25 MG PO TABS: ORAL_TABLET | ORAL | 11 refills | Status: DC

## 2016-07-21 MED ORDER — EZETIMIBE 10 MG PO TABS: ORAL_TABLET | ORAL | 11 refills | Status: DC

## 2016-07-21 NOTE — Progress Notes (Signed)
   Subjective:    Patient ID: [name], male    DOB: 12-Nov-1950, 66 y.o.   MRN: [id number]  HPI Here to follow up on issues and to review his labs from the recent exam. His LDL remains high, although the total cholesterol and TG are within target range. He does not tolerate statins so he does the best he can with diet and with taking Zetia. His fasting glucose is slightly elevated, but his A1c is normal. He has eliminated most of the carbs in his diet, although he still eats bread 2-3 days a week. His renal function is normal. Otherwise the labs were unremarkable. He feels well today.    Review of Systems  Constitutional: Negative.   Respiratory: Negative.   Cardiovascular: Negative.   Neurological: Negative.        Objective:   Physical Exam  Constitutional: He is oriented to person, place, and time. He appears well-developed and well-nourished.  Cardiovascular: Normal rate, regular rhythm, normal heart sounds and intact distal pulses.   Pulmonary/Chest: Effort normal and breath sounds normal.  Neurological: He is alert and oriented to person, place, and time.          Assessment & Plan:  He is doing well in general. We reviewed dietary information. Recheck as scheduled.  Alysia Penna, MD

## 2016-07-24 NOTE — Progress Notes (Signed)
Cardiology Office Note   Date:  07/25/2016   ID:  [name], DOB 12-Jan-1951, MRN [id number]  PCP:  [name], MD  Cardiologist:   [name], MD   F/U of atrial fib, mitral regurgitation and HTN  History of Present Illness: [name] is a 66 y.o. male with a history of mitral regurgitation, HTN, atrial fib, sleep apnea and morbid obesity.  I saw him in June 2017 SInce seen he is limited by his hip No dizziness  No PND Breathing OK  No palpitations       Current Meds  Medication Sig  . amLODipine (NORVASC) 10 MG tablet TAKE 1 TABLET (10 MG TOTAL) BY MOUTH DAILY.  Marland Kitchen aspirin 81 MG tablet Take 81 mg by mouth daily.    . carvedilol (COREG) 25 MG tablet TAKE 1 TABLET (25 MG TOTAL) BY MOUTH 2 (TWO) TIMES DAILY.  . cloNIDine (CATAPRES) 0.2 MG tablet TAKE 1 TABLET (0.2 MG TOTAL) BY MOUTH 2 (TWO) TIMES DAILY.  Marland Kitchen doxazosin (CARDURA) 4 MG tablet TAKE 1 TABLET (4 MG TOTAL) BY MOUTH DAILY.  Marland Kitchen ezetimibe (ZETIA) 10 MG tablet TAKE 1 TABLET (10 MG TOTAL) BY MOUTH DAILY.  . fish oil-omega-3 fatty acids 1000 MG capsule Take 1 g by mouth daily.    . hydrochlorothiazide (HYDRODIURIL) 25 MG tablet TAKE 1 TABLET (25 MG TOTAL) BY MOUTH DAILY  . losartan (COZAAR) 100 MG tablet TAKE 1 TABLET (100 MG TOTAL) BY MOUTH DAILY.  Marland Kitchen lubiprostone (AMITIZA) 24 MCG capsule Take 1 capsule (24 mcg total) by mouth 2 (two) times daily with a meal.  . meloxicam (MOBIC) 15 MG tablet TAKE ONE TABLET BY MOUTH DAILY WITH FOOD  . VITAMIN D, CHOLECALCIFEROL, PO Take 5,000 Units by mouth daily.  Marland Kitchen warfarin (COUMADIN) 5 MG tablet TAKE AS DIRECTED PER COUMADIN CLINIC     Allergies:   Statins and Penicillins   Past Medical History:  Diagnosis Date  . Adenomatous colon polyp 07/2003  . Atrial fibrillation [facility])    sees Dr. [name]   . Heart valve problem    Dr [name]  . Hypertension   . Mitral valve disorders(424.0)   . Obesity   . OSA (obstructive sleep apnea)    cpap  . Other and unspecified  hyperlipidemia   . Sleep apnea     Past Surgical History:  Procedure Laterality Date  . COLONOSCOPY  06-09-15   per Dr. Fuller Plan, adenomatous polyp, repeat in 5 yrs   . FOOT SURGERY  at age 23  . LUMBAR LAMINECTOMY/DECOMPRESSION MICRODISCECTOMY N/A 11/21/2012   Procedure: Lumbar Three to Lumbar Five Laminectomy;  Surgeon: Floyce Stakes, MD;  Location: [facility];  Service: Neurosurgery;  Laterality: N/A;  Lumbar Three to Lumbar Five Laminectomy     Social History:  The patient  reports that he has never smoked. He has never used smokeless tobacco. He reports that he does not drink alcohol or use drugs.   Family History:  The patient's family history includes Alzheimer's disease in his maternal aunt and mother; Heart disease in his father; Hypertension in his father; Stroke in his father.    ROS:  Please see the history of present illness. All other systems are reviewed and  Negative to the above problem except as noted.    PHYSICAL EXAM: VS:  BP 140/90   Pulse 74   Ht 5\' 10"  (1.778 m)   Wt 284 lb (128.8 kg)   BMI 40.75 kg/m  GEN: Morbidly obese 66 yo, in no acute distress  HEENT: normal  Neck: no JVD, carotid bruits, or masses Cardiac:Irreg irreg  ; no murmurs, rubs, or gallops,tr edema  Respiratory:  clear to auscultation bilaterally, normal work of breathing GI: soft, nontender, nondistended, + BS  No hepatomegaly  MS: no deformity Moving all extremities   Skin: warm and dry, no rash Neuro:  Strength and sensation are intact Psych: euthymic mood, full affect   EKG:  EKG is ordered today.  Atrial fib  74 bpm  RBBB  LAFB     Lipid Panel    Component Value Date/Time   CHOL 190 07/18/2016 0922   TRIG 99.0 07/18/2016 0922   HDL 36.80 (L) 07/18/2016 0922   CHOLHDL 5 07/18/2016 0922   VLDL 19.8 07/18/2016 0922   LDLCALC 133 (H) 07/18/2016 0922   LDLDIRECT 168.0 03/25/2014 0844      Wt Readings from Last 3 Encounters:  07/25/16 284 lb (128.8 kg)  07/21/16 284 lb  (128.8 kg)  07/18/16 284 lb (128.8 kg)      ASSESSMENT AND PLAN:  1  Atrial fibrillation  Permanent  Keep on current regimen including coumadin  2  Mitral regurgitation  Will set up for echo to reevaluate    Current medicines are reviewed at length with the patient today.  The patient does not have concerns regarding medicines.  Signed, [name], MD  07/25/2016 3:55 PM    [facility] [place], [place], [place]  [address] Phone: [phone]; Fax: [phone]

## 2016-07-25 ENCOUNTER — Encounter: Payer: Self-pay | Admitting: Internal Medicine

## 2016-07-25 ENCOUNTER — Ambulatory Visit (INDEPENDENT_AMBULATORY_CARE_PROVIDER_SITE_OTHER): Payer: PPO | Admitting: Internal Medicine

## 2016-07-25 VITALS — BP 140/90 | HR 74 | Ht 70.0 in | Wt 284.0 lb

## 2016-07-25 DIAGNOSIS — I340 Nonrheumatic mitral (valve) insufficiency: Secondary | ICD-10-CM

## 2016-07-25 NOTE — Patient Instructions (Signed)
Your physician recommends that you continue on your current medications as directed. Please refer to the Current Medication list given to you today.  Your physician has requested that you have an echocardiogram. Echocardiography is a painless test that uses sound waves to create images of your heart. It provides your doctor with information about the size and shape of your heart and how well your heart's chambers and valves are working. This procedure takes approximately one hour. There are no restrictions for this procedure.  Your physician wants you to follow-up in: 1 year with Dr. Harrington Challenger.  You will receive a reminder letter in the mail two months in advance. If you don't receive a letter, please call our office to schedule the follow-up appointment.

## 2016-08-03 ENCOUNTER — Ambulatory Visit (INDEPENDENT_AMBULATORY_CARE_PROVIDER_SITE_OTHER): Payer: PPO | Admitting: *Deleted

## 2016-08-03 DIAGNOSIS — I4891 Unspecified atrial fibrillation: Secondary | ICD-10-CM | POA: Diagnosis not present

## 2016-08-03 DIAGNOSIS — Z5181 Encounter for therapeutic drug level monitoring: Secondary | ICD-10-CM | POA: Diagnosis not present

## 2016-08-03 DIAGNOSIS — Z7901 Long term (current) use of anticoagulants: Secondary | ICD-10-CM

## 2016-08-03 LAB — POCT INR: INR: 2.8

## 2016-08-08 ENCOUNTER — Other Ambulatory Visit (HOSPITAL_COMMUNITY): Payer: PPO

## 2016-08-19 ENCOUNTER — Other Ambulatory Visit: Payer: Self-pay

## 2016-08-19 ENCOUNTER — Ambulatory Visit (HOSPITAL_COMMUNITY): Payer: PPO | Attending: Internal Medicine

## 2016-08-19 DIAGNOSIS — I340 Nonrheumatic mitral (valve) insufficiency: Secondary | ICD-10-CM

## 2016-08-19 DIAGNOSIS — I313 Pericardial effusion (noninflammatory): Secondary | ICD-10-CM | POA: Diagnosis not present

## 2016-08-19 MED ORDER — PERFLUTREN LIPID MICROSPHERE
1.0000 mL | INTRAVENOUS | Status: AC | PRN
  Administered 2016-08-19: 3 mL via INTRAVENOUS

## 2016-09-14 ENCOUNTER — Ambulatory Visit (INDEPENDENT_AMBULATORY_CARE_PROVIDER_SITE_OTHER): Payer: PPO | Admitting: *Deleted

## 2016-09-14 DIAGNOSIS — Z7901 Long term (current) use of anticoagulants: Secondary | ICD-10-CM

## 2016-09-14 DIAGNOSIS — Z5181 Encounter for therapeutic drug level monitoring: Secondary | ICD-10-CM

## 2016-09-14 DIAGNOSIS — I4891 Unspecified atrial fibrillation: Secondary | ICD-10-CM

## 2016-09-14 LAB — POCT INR: INR: 2.8

## 2016-09-23 ENCOUNTER — Other Ambulatory Visit: Payer: Self-pay | Admitting: Internal Medicine

## 2016-10-26 ENCOUNTER — Encounter (INDEPENDENT_AMBULATORY_CARE_PROVIDER_SITE_OTHER): Payer: Self-pay

## 2016-10-26 ENCOUNTER — Ambulatory Visit (INDEPENDENT_AMBULATORY_CARE_PROVIDER_SITE_OTHER): Payer: PPO

## 2016-10-26 DIAGNOSIS — I4891 Unspecified atrial fibrillation: Secondary | ICD-10-CM

## 2016-10-26 DIAGNOSIS — Z7901 Long term (current) use of anticoagulants: Secondary | ICD-10-CM | POA: Diagnosis not present

## 2016-10-26 DIAGNOSIS — Z5181 Encounter for therapeutic drug level monitoring: Secondary | ICD-10-CM

## 2016-10-26 LAB — POCT INR: INR: 3.2

## 2016-11-23 ENCOUNTER — Ambulatory Visit (INDEPENDENT_AMBULATORY_CARE_PROVIDER_SITE_OTHER): Payer: PPO | Admitting: *Deleted

## 2016-11-23 DIAGNOSIS — I4891 Unspecified atrial fibrillation: Secondary | ICD-10-CM | POA: Diagnosis not present

## 2016-11-23 DIAGNOSIS — Z5181 Encounter for therapeutic drug level monitoring: Secondary | ICD-10-CM | POA: Diagnosis not present

## 2016-11-23 DIAGNOSIS — Z7901 Long term (current) use of anticoagulants: Secondary | ICD-10-CM | POA: Diagnosis not present

## 2016-11-23 LAB — POCT INR: INR: 2.3

## 2016-12-02 ENCOUNTER — Other Ambulatory Visit: Payer: Self-pay | Admitting: Internal Medicine

## 2016-12-21 ENCOUNTER — Ambulatory Visit (INDEPENDENT_AMBULATORY_CARE_PROVIDER_SITE_OTHER): Payer: PPO | Admitting: *Deleted

## 2016-12-21 DIAGNOSIS — Z7901 Long term (current) use of anticoagulants: Secondary | ICD-10-CM

## 2016-12-21 DIAGNOSIS — I4891 Unspecified atrial fibrillation: Secondary | ICD-10-CM | POA: Diagnosis not present

## 2016-12-21 DIAGNOSIS — Z5181 Encounter for therapeutic drug level monitoring: Secondary | ICD-10-CM

## 2016-12-21 LAB — POCT INR: INR: 2.3

## 2017-01-13 ENCOUNTER — Other Ambulatory Visit: Payer: Self-pay | Admitting: Family Medicine

## 2017-01-18 ENCOUNTER — Ambulatory Visit (INDEPENDENT_AMBULATORY_CARE_PROVIDER_SITE_OTHER): Payer: PPO | Admitting: *Deleted

## 2017-01-18 DIAGNOSIS — Z5181 Encounter for therapeutic drug level monitoring: Secondary | ICD-10-CM | POA: Diagnosis not present

## 2017-01-18 DIAGNOSIS — Z7901 Long term (current) use of anticoagulants: Secondary | ICD-10-CM

## 2017-01-18 DIAGNOSIS — I4891 Unspecified atrial fibrillation: Secondary | ICD-10-CM

## 2017-01-18 LAB — POCT INR: INR: 2.5

## 2017-01-18 NOTE — Patient Instructions (Signed)
Continue same dose 1.5 tablets (7.5mg )  daily . Call us with any new medications scheduled for any procedures or any questions  [phone] Main number [phone].  Recheck in 6 weeks

## 2017-03-02 ENCOUNTER — Ambulatory Visit (INDEPENDENT_AMBULATORY_CARE_PROVIDER_SITE_OTHER): Payer: PPO | Admitting: *Deleted

## 2017-03-02 DIAGNOSIS — Z7901 Long term (current) use of anticoagulants: Secondary | ICD-10-CM | POA: Diagnosis not present

## 2017-03-02 DIAGNOSIS — I4891 Unspecified atrial fibrillation: Secondary | ICD-10-CM

## 2017-03-02 DIAGNOSIS — Z5181 Encounter for therapeutic drug level monitoring: Secondary | ICD-10-CM | POA: Diagnosis not present

## 2017-03-02 LAB — POCT INR: INR: 3.6

## 2017-03-02 NOTE — Patient Instructions (Signed)
Description   Do not take any Coumadin today then continue same dose 1.5 tablets (7.5mg ) daily. Call us with any new medications scheduled for any procedures or any questions.  Recheck in 4 weeks. Phone [phone] [facility]  Main # [phone].

## 2017-03-29 ENCOUNTER — Ambulatory Visit (INDEPENDENT_AMBULATORY_CARE_PROVIDER_SITE_OTHER): Payer: PPO | Admitting: *Deleted

## 2017-03-29 DIAGNOSIS — Z5181 Encounter for therapeutic drug level monitoring: Secondary | ICD-10-CM

## 2017-03-29 DIAGNOSIS — Z7901 Long term (current) use of anticoagulants: Secondary | ICD-10-CM | POA: Diagnosis not present

## 2017-03-29 DIAGNOSIS — I4891 Unspecified atrial fibrillation: Secondary | ICD-10-CM | POA: Diagnosis not present

## 2017-03-29 LAB — POCT INR: INR: 2.5

## 2017-03-29 NOTE — Patient Instructions (Signed)
Description   Continue same dose 1.5 tablets (7.5mg ) daily. Call us with any new medications scheduled for any procedures or any questions.  Recheck in 4 weeks. Phone [phone] [facility]  Main # [phone].

## 2017-04-11 ENCOUNTER — Other Ambulatory Visit: Payer: Self-pay | Admitting: Family Medicine

## 2017-04-13 ENCOUNTER — Other Ambulatory Visit: Payer: Self-pay | Admitting: Internal Medicine

## 2017-04-26 ENCOUNTER — Ambulatory Visit (INDEPENDENT_AMBULATORY_CARE_PROVIDER_SITE_OTHER): Payer: PPO | Admitting: *Deleted

## 2017-04-26 DIAGNOSIS — I4891 Unspecified atrial fibrillation: Secondary | ICD-10-CM

## 2017-04-26 DIAGNOSIS — Z7901 Long term (current) use of anticoagulants: Secondary | ICD-10-CM

## 2017-04-26 DIAGNOSIS — Z5181 Encounter for therapeutic drug level monitoring: Secondary | ICD-10-CM

## 2017-04-26 LAB — POCT INR: INR: 3.2

## 2017-04-26 NOTE — Patient Instructions (Signed)
Description   Skip today's dose, then Continue same dose 1.5 tablets (7.5mg ) daily. Call us with any new medications scheduled for any procedures or any questions.  Recheck in 3 weeks. Phone [phone] [facility]  Main # [phone].

## 2017-05-11 ENCOUNTER — Other Ambulatory Visit: Payer: Self-pay | Admitting: Family Medicine

## 2017-05-17 ENCOUNTER — Ambulatory Visit (INDEPENDENT_AMBULATORY_CARE_PROVIDER_SITE_OTHER): Payer: PPO | Admitting: Pharmacist

## 2017-05-17 DIAGNOSIS — Z5181 Encounter for therapeutic drug level monitoring: Secondary | ICD-10-CM | POA: Diagnosis not present

## 2017-05-17 DIAGNOSIS — I4891 Unspecified atrial fibrillation: Secondary | ICD-10-CM | POA: Diagnosis not present

## 2017-05-17 DIAGNOSIS — Z7901 Long term (current) use of anticoagulants: Secondary | ICD-10-CM | POA: Diagnosis not present

## 2017-05-17 LAB — POCT INR: INR: 2.5

## 2017-05-17 NOTE — Patient Instructions (Signed)
Description   Continue same dose 1.5 tablets (7.5mg ) daily. Call us with any new medications scheduled for any procedures or any questions.  Recheck in 4 weeks. Phone [phone] [facility]  Main # [phone].

## 2017-06-10 ENCOUNTER — Other Ambulatory Visit: Payer: Self-pay | Admitting: Family Medicine

## 2017-06-10 ENCOUNTER — Other Ambulatory Visit: Payer: Self-pay | Admitting: Internal Medicine

## 2017-06-14 ENCOUNTER — Ambulatory Visit (INDEPENDENT_AMBULATORY_CARE_PROVIDER_SITE_OTHER): Payer: PPO | Admitting: *Deleted

## 2017-06-14 DIAGNOSIS — I4891 Unspecified atrial fibrillation: Secondary | ICD-10-CM

## 2017-06-14 DIAGNOSIS — Z5181 Encounter for therapeutic drug level monitoring: Secondary | ICD-10-CM | POA: Diagnosis not present

## 2017-06-14 LAB — POCT INR: INR: 2.2

## 2017-06-14 NOTE — Patient Instructions (Signed)
Description   Continue same dose 1.5 tablets (7.5mg ) daily. Call us with any new medications scheduled for any procedures or any questions.  Recheck in 5 weeks. Phone [phone] [facility]  Main # [phone].

## 2017-07-05 ENCOUNTER — Encounter: Payer: Self-pay | Admitting: Physician Assistant

## 2017-07-10 ENCOUNTER — Other Ambulatory Visit: Payer: Self-pay | Admitting: Family Medicine

## 2017-07-10 NOTE — Telephone Encounter (Signed)
Pt last OV was 07/21/2016 and last refill was done on 07/21/2016 for #90 with 3 refills,please advise if ok to refill med.

## 2017-07-10 NOTE — Telephone Encounter (Signed)
Sent to PCP to advise 

## 2017-07-19 ENCOUNTER — Ambulatory Visit (INDEPENDENT_AMBULATORY_CARE_PROVIDER_SITE_OTHER): Payer: PPO | Admitting: Pharmacist

## 2017-07-19 DIAGNOSIS — Z5181 Encounter for therapeutic drug level monitoring: Secondary | ICD-10-CM

## 2017-07-19 DIAGNOSIS — I4891 Unspecified atrial fibrillation: Secondary | ICD-10-CM | POA: Diagnosis not present

## 2017-07-19 LAB — POCT INR: INR: 2.5

## 2017-07-19 NOTE — Patient Instructions (Signed)
Description   Continue same dose 1.5 tablets (7.5mg) daily. Call us with any new medications scheduled for any procedures or any questions.  Recheck in 6 weeks. Phone [phone] [facility]  Main # [phone].      

## 2017-07-20 ENCOUNTER — Ambulatory Visit (INDEPENDENT_AMBULATORY_CARE_PROVIDER_SITE_OTHER): Payer: PPO | Admitting: Family Medicine

## 2017-07-20 ENCOUNTER — Encounter: Payer: Self-pay | Admitting: Family Medicine

## 2017-07-20 VITALS — BP 118/86 | HR 70 | Temp 97.7°F | Wt 281.0 lb

## 2017-07-20 DIAGNOSIS — Z0000 Encounter for general adult medical examination without abnormal findings: Secondary | ICD-10-CM | POA: Diagnosis not present

## 2017-07-20 DIAGNOSIS — R739 Hyperglycemia, unspecified: Secondary | ICD-10-CM | POA: Diagnosis not present

## 2017-07-20 LAB — LIPID PANEL
CHOL/HDL RATIO: 5
Cholesterol: 185 mg/dL (ref 0–200)
HDL: 35.2 mg/dL — AB (ref >39.00)
LDL Cholesterol: 127 mg/dL — ABNORMAL HIGH (ref 0–99)
NONHDL: 149.38
Triglycerides: 110 mg/dL (ref 0.0–149.0)
VLDL: 22 mg/dL (ref 0.0–40.0)

## 2017-07-20 LAB — HEMOGLOBIN A1C: Hgb A1c MFr Bld: 5.8 % (ref 4.6–6.5)

## 2017-07-20 LAB — BASIC METABOLIC PANEL
BUN: 30 mg/dL — ABNORMAL HIGH (ref 6–23)
CALCIUM: 11.3 mg/dL — AB (ref 8.4–10.5)
CO2: 26 meq/L (ref 19–32)
CREATININE: 1.07 mg/dL (ref 0.40–1.50)
Chloride: 102 mEq/L (ref 96–112)
GFR: 73.15 mL/min (ref >60.00)
Glucose, Bld: 114 mg/dL — ABNORMAL HIGH (ref 70–99)
Potassium: 4.1 mEq/L (ref 3.5–5.1)
Sodium: 138 mEq/L (ref 135–145)

## 2017-07-20 LAB — CBC WITH DIFFERENTIAL/PLATELET
BASOS PCT: 0.5 % (ref 0.0–3.0)
Basophils Absolute: 0 10*3/uL (ref 0.0–0.1)
EOS PCT: 1.5 % (ref 0.0–5.0)
Eosinophils Absolute: 0.1 10*3/uL (ref 0.0–0.7)
HEMATOCRIT: 40.4 % (ref 39.0–52.0)
Hemoglobin: 14.2 g/dL (ref 13.0–17.0)
LYMPHS PCT: 20.4 % (ref 12.0–46.0)
Lymphs Abs: 1.8 10*3/uL (ref 0.7–4.0)
MCHC: 35 g/dL (ref 30.0–36.0)
MCV: 92.6 fl (ref 78.0–100.0)
MONOS PCT: 7.7 % (ref 3.0–12.0)
Monocytes Absolute: 0.7 10*3/uL (ref 0.1–1.0)
NEUTROS ABS: 6.2 10*3/uL (ref 1.4–7.7)
Neutrophils Relative %: 69.9 % (ref 43.0–77.0)
PLATELETS: 200 10*3/uL (ref 150.0–400.0)
RBC: 4.36 Mil/uL (ref 4.22–5.81)
RDW: 13.5 % (ref 11.5–15.5)
WBC: 8.9 10*3/uL (ref 4.0–10.5)

## 2017-07-20 LAB — POC URINALSYSI DIPSTICK (AUTOMATED)
GLUCOSE UA: NEGATIVE
Ketones, UA: NEGATIVE
LEUKOCYTES UA: NEGATIVE
NITRITE UA: NEGATIVE
Protein, UA: NEGATIVE
RBC UA: NEGATIVE
Spec Grav, UA: >=1.03 — AB (ref 1.010–1.025)
UROBILINOGEN UA: 0.2 U/dL
pH, UA: 6 (ref 5.0–8.0)

## 2017-07-20 LAB — HEPATIC FUNCTION PANEL
ALBUMIN: 4.2 g/dL (ref 3.5–5.2)
ALK PHOS: 95 U/L (ref 39–117)
ALT: 19 U/L (ref 0–53)
AST: 16 U/L (ref 0–37)
BILIRUBIN DIRECT: 0.2 mg/dL (ref 0.0–0.3)
Total Bilirubin: 0.7 mg/dL (ref 0.2–1.2)
Total Protein: 6.9 g/dL (ref 6.0–8.3)

## 2017-07-20 LAB — PSA: PSA: 0.46 ng/mL (ref 0.10–4.00)

## 2017-07-20 LAB — TSH: TSH: 3.5 u[IU]/mL (ref 0.35–4.50)

## 2017-07-20 NOTE — Progress Notes (Signed)
   Subjective:    Patient ID: [name], male    DOB: 08-Jun-1950, 67 y.o.   MRN: [id number]  HPI Here for a well exam. He is doing reasonably well. It is hard for him to get around but he manages okay. He still drives. His BP is stable. He does not use his CPAP machine. He struggles with constipation but the Amitiza helps.    Review of Systems  Constitutional: Negative.   HENT: Negative.   Eyes: Negative.   Respiratory: Negative.   Cardiovascular: Negative.   Gastrointestinal: Positive for constipation. Negative for abdominal distention, abdominal pain, anal bleeding, blood in stool, diarrhea, nausea, rectal pain and vomiting.  Genitourinary: Negative.   Musculoskeletal: Positive for arthralgias, back pain and gait problem.  Skin: Negative.   Neurological: Negative for dizziness, tremors, seizures, syncope, facial asymmetry, speech difficulty, weakness, numbness and headaches.  Psychiatric/Behavioral: Negative.        Objective:   Physical Exam  Constitutional: He is oriented to person, place, and time. He appears well-developed and well-nourished. No distress.  Uses a walker   HENT:  Head: Normocephalic and atraumatic.  Right Ear: External ear normal.  Left Ear: External ear normal.  Nose: Nose normal.  Mouth/Throat: Oropharynx is clear and moist. No oropharyngeal exudate.  Eyes: Pupils are equal, round, and reactive to light. Conjunctivae and EOM are normal. Right eye exhibits no discharge. Left eye exhibits no discharge. No scleral icterus.  Neck: Neck supple. No JVD present. No tracheal deviation present. No thyromegaly present.  Cardiovascular: Normal rate, normal heart sounds and intact distal pulses. Exam reveals no gallop and no friction rub.  No murmur heard. Irregular rhythm   Pulmonary/Chest: Effort normal and breath sounds normal. No respiratory distress. He has no wheezes. He has no rales. He exhibits no tenderness.  Abdominal: Soft. Bowel sounds are normal. He  exhibits no distension and no mass. There is no tenderness. There is no rebound and no guarding.  Genitourinary: Rectum normal, prostate normal and penis normal. Rectal exam shows guaiac negative stool. No penile tenderness.  Musculoskeletal: Normal range of motion. He exhibits no edema or tenderness.  Lymphadenopathy:    He has no cervical adenopathy.  Neurological: He is alert and oriented to person, place, and time. He has normal reflexes. He displays normal reflexes. No cranial nerve deficit. He exhibits normal muscle tone. Coordination normal.  Skin: Skin is warm and dry. No rash noted. He is not diaphoretic. No erythema. No pallor.  Psychiatric: He has a normal mood and affect. His behavior is normal. Judgment and thought content normal.          Assessment & Plan:  Well exam. We discussed diet and exercise. Get fasting labs. He will see Cardiology next week.  Alysia Penna, MD

## 2017-07-25 ENCOUNTER — Ambulatory Visit: Payer: PPO | Admitting: Physician Assistant

## 2017-07-25 ENCOUNTER — Encounter: Payer: Self-pay | Admitting: Physician Assistant

## 2017-07-25 VITALS — BP 130/90 | HR 79 | Ht 70.0 in | Wt 283.0 lb

## 2017-07-25 DIAGNOSIS — I340 Nonrheumatic mitral (valve) insufficiency: Secondary | ICD-10-CM | POA: Diagnosis not present

## 2017-07-25 DIAGNOSIS — I4821 Permanent atrial fibrillation: Secondary | ICD-10-CM

## 2017-07-25 DIAGNOSIS — I482 Chronic atrial fibrillation: Secondary | ICD-10-CM

## 2017-07-25 DIAGNOSIS — G4733 Obstructive sleep apnea (adult) (pediatric): Secondary | ICD-10-CM | POA: Diagnosis not present

## 2017-07-25 DIAGNOSIS — I10 Essential (primary) hypertension: Secondary | ICD-10-CM | POA: Diagnosis not present

## 2017-07-25 MED ORDER — CLONIDINE HCL 0.2 MG PO TABS: 0.2000 mg | ORAL_TABLET | Freq: Two times a day (BID) | ORAL | 11 refills | Status: DC

## 2017-07-25 NOTE — Patient Instructions (Signed)
Medication Instructions:  Your physician has recommended you make the following change in your medication:   1. Per Richardson Dopp, PAC STOP Aspirin 81 mg.    Labwork: None ordered  Testing/Procedures: None ordered  Follow-Up: Your physician wants you to follow-up in: 1 year with Dr. Harrington Challenger or Richardson Dopp, [facility]. You will receive a reminder letter in the mail two months in advance. If you don't receive a letter, please call our office to schedule the follow-up appointment.   Any Other Special Instructions Will Be Listed Below (If Applicable).     If you need a refill on your cardiac medications before your next appointment, please call your pharmacy.

## 2017-07-25 NOTE — Progress Notes (Signed)
Cardiology Office Note:    Date:  07/25/2017   ID:  [name], DOB Sep 26, 1950, MRN [id number]  PCP:  [name], MD  Cardiologist:  [name], MD   Referring MD: [name], MD   Chief Complaint  Patient presents with  . Follow-up    AFib    History of Present Illness:    [name] is a 67 y.o. male with mitral regurgitation, hypertension, atrial fibrillation, obstructive sleep apnea, morbid obesity.  Last seen by Dr. Harrington Challenger in May 2018.  [name] returns for follow up.  He is here alone.  He has chronic issues with his back and hips from DJD.  He denies any chest pain, paroxysmal nocturnal dyspnea, edema.  He denies significant shortness of breath.  He ambulates with a walker.  He denies syncope.  He denies any bleeding issues.  He has difficulty exercising due to decreased mobility.  He has discussed cutting back on his carbohydrates with his PCP in order to try to lose weight.    Prior CV studies:   The following studies were reviewed today:  Echo 08/19/16 Mild LVH, EF 65-70, mild AS (mean 21, peak 41), trivial MR, mild LAE, small eff  Echocardiogram 02/26/2014 Moderate LVH, EF 55-60, calcified aortic valve (mean 15), moderate LAE, trivial effusion   Past Medical History:  Diagnosis Date  . Adenomatous colon polyp 07/2003  . Atrial fibrillation [facility])    sees Dr. [name]   . Heart valve problem    Dr [name]  . Hypertension   . Mitral valve disorders(424.0)   . Obesity   . OSA (obstructive sleep apnea)    cpap  . Other and unspecified hyperlipidemia   . Sleep apnea    Surgical Hx: The patient  has a past surgical history that includes Colonoscopy (06-09-15); Lumbar laminectomy/decompression microdiscectomy (N/A, 11/21/2012); and Foot surgery (at age 52).   Current Medications: Current Meds  Medication Sig  . AMITIZA 24 MCG capsule TAKE ONE CAPSULE BY MOUTH TWICE A DAY  . amLODipine (NORVASC) 10 MG tablet TAKE 1 TABLET (10 MG TOTAL) BY MOUTH DAILY.   . carvedilol (COREG) 25 MG tablet TAKE 1 TABLET (25 MG TOTAL) BY MOUTH 2 (TWO) TIMES DAILY.  . cloNIDine (CATAPRES) 0.2 MG tablet Take 1 tablet (0.2 mg total) by mouth 2 (two) times daily.  Marland Kitchen doxazosin (CARDURA) 4 MG tablet TAKE 1 TABLET (4 MG TOTAL) BY MOUTH DAILY.  Marland Kitchen ezetimibe (ZETIA) 10 MG tablet TAKE 1 TABLET (10 MG TOTAL) BY MOUTH DAILY.  . fish oil-omega-3 fatty acids 1000 MG capsule Take 1 g by mouth daily.    . hydrochlorothiazide (HYDRODIURIL) 25 MG tablet TAKE 1 TABLET (25 MG TOTAL) BY MOUTH DAILY  . losartan (COZAAR) 100 MG tablet TAKE 1 TABLET (100 MG TOTAL) BY MOUTH DAILY.  . meloxicam (MOBIC) 15 MG tablet TAKE ONE TABLET BY MOUTH DAILY WITH FOOD  . VITAMIN D, CHOLECALCIFEROL, PO Take 5,000 Units by mouth daily.  Marland Kitchen warfarin (COUMADIN) 5 MG tablet TAKE AS DIRECTED PER COUMADIN CLINIC (30 DAY SUPPLY)  . [DISCONTINUED] aspirin 81 MG tablet Take 81 mg by mouth daily.    . [DISCONTINUED] cloNIDine (CATAPRES) 0.2 MG tablet Take 1 tablet (0.2 mg total) by mouth 2 (two) times daily. Please make yearly appt with Dr. Harrington Challenger for May before anymore refills. 1st attempt     Allergies:   Statins and Penicillins   Social History   Tobacco Use  .  Smoking status: Never Smoker  . Smokeless tobacco: Never Used  Substance Use Topics  . Alcohol use: No    Alcohol/week: 0.0 oz  . Drug use: No     Family Hx: The patient's family history includes Alzheimer's disease in his maternal aunt and mother; Heart disease in his father; Hypertension in his father; Stroke in his father. There is no history of Colon cancer.  ROS:   Please see the history of present illness.    Review of Systems  Musculoskeletal: Positive for back pain and myalgias.  Gastrointestinal: Positive for constipation.   All other systems reviewed and are negative.   EKGs/Labs/Other Test Reviewed:    EKG:  EKG is  ordered today.  The ekg ordered today demonstrates atrial fibrillation, heart rate 79, left axis deviation,  right bundle branch block, QTC 421, similar to prior tracing 07/25/16  Recent Labs: 07/20/2017: ALT 19; BUN 30; Creatinine, Ser 1.07; Hemoglobin 14.2; Platelets 200.0; Potassium 4.1; Sodium 138; TSH 3.50   Recent Lipid Panel Lab Results  Component Value Date/Time   CHOL 185 07/20/2017 08:58 AM   TRIG 110.0 07/20/2017 08:58 AM   HDL 35.20 (L) 07/20/2017 08:58 AM   CHOLHDL 5 07/20/2017 08:58 AM   LDLCALC 127 (H) 07/20/2017 08:58 AM   LDLDIRECT 168.0 03/25/2014 08:44 AM    Physical Exam:    VS:  BP 130/90   Pulse 79   Ht 5\' 10"  (1.778 m)   Wt 283 lb (128.4 kg)   BMI 40.61 kg/m     Wt Readings from Last 3 Encounters:  07/25/17 283 lb (128.4 kg)  07/20/17 281 lb (127.5 kg)  07/25/16 284 lb (128.8 kg)     Physical Exam  Constitutional: He is oriented to person, place, and time. He appears well-developed and well-nourished. No distress.  HENT:  Head: Normocephalic and atraumatic.  Neck: Neck supple. No JVD present.  Cardiovascular: Normal rate, S1 normal and S2 normal. An irregularly irregular rhythm present.  No murmur heard. Pulmonary/Chest: Breath sounds normal. He has no rales.  Abdominal: Soft. There is no hepatomegaly.  Musculoskeletal: He exhibits edema (trace bilat LE edema).  Neurological: He is alert and oriented to person, place, and time.  Skin: Skin is warm and dry.    ASSESSMENT & PLAN:    Permanent atrial fibrillation (HCC) Rate is controlled.  He is tolerating anticoagulation with Coumadin.  He denies any bleeding issues.  Recent hemoglobin was normal.  Continue current medical therapy.  Essential hypertension Blood pressure is elevated today.  It is typically well controlled.  Continue current therapy.  Continue to monitor.  Mitral valve insufficiency, unspecified etiology Trivial mitral regurgitation by most recent echocardiogram.  No evidence of murmur on exam.  OSA (obstructive sleep apnea) He is intolerant to CPAP.  Morbid obesity He has  discussed adjusting his diet with his primary care physician in order to try to lose weight.  Losing weight is somewhat difficult for him as his mobility has decreased and he is unable to be very active.   Dispo:  Return in about 1 year (around 07/26/2018) for Routine Follow Up, w/ Dr. Harrington Challenger, or Richardson Dopp, PA-C.   Medication Adjustments/Labs and Tests Ordered: Current medicines are reviewed at length with the patient today.  Concerns regarding medicines are outlined above.  Tests Ordered: Orders Placed This Encounter  Procedures  . EKG 12-Lead   Medication Changes: Meds ordered this encounter  Medications  . cloNIDine (CATAPRES) 0.2 MG tablet    Sig:  Take 1 tablet (0.2 mg total) by mouth 2 (two) times daily.    Dispense:  60 tablet    Refill:  [address] Richardson Dopp, [place]  07/25/2017 4:33 PM    [facility] [place], [place], [place]  [address] Phone: [phone]; Fax: [phone]

## 2017-08-09 ENCOUNTER — Other Ambulatory Visit: Payer: Self-pay | Admitting: Family Medicine

## 2017-08-10 NOTE — Telephone Encounter (Signed)
Last OV 07/25/2017   Coreg last refilled 07/21/2016 disp 60 with 11 refills   Ezetimibe last refilled 07/21/2016 disp 30 with 11 refills   Sent to PCP for approval

## 2017-08-23 ENCOUNTER — Other Ambulatory Visit: Payer: Self-pay | Admitting: Internal Medicine

## 2017-08-30 ENCOUNTER — Ambulatory Visit (INDEPENDENT_AMBULATORY_CARE_PROVIDER_SITE_OTHER): Payer: PPO | Admitting: *Deleted

## 2017-08-30 DIAGNOSIS — I4891 Unspecified atrial fibrillation: Secondary | ICD-10-CM | POA: Diagnosis not present

## 2017-08-30 DIAGNOSIS — Z5181 Encounter for therapeutic drug level monitoring: Secondary | ICD-10-CM | POA: Diagnosis not present

## 2017-08-30 LAB — POCT INR: INR: 2.3 (ref 2.0–3.0)

## 2017-08-30 NOTE — Patient Instructions (Signed)
Description   Continue same dose 1.5 tablets (7.5mg) daily. Call us with any new medications scheduled for any procedures or any questions.  Recheck in 6 weeks. Phone [phone] [facility]  Main # [phone].      

## 2017-10-08 ENCOUNTER — Other Ambulatory Visit: Payer: Self-pay | Admitting: Family Medicine

## 2017-10-11 ENCOUNTER — Ambulatory Visit (INDEPENDENT_AMBULATORY_CARE_PROVIDER_SITE_OTHER): Payer: PPO | Admitting: Pharmacist

## 2017-10-11 DIAGNOSIS — Z5181 Encounter for therapeutic drug level monitoring: Secondary | ICD-10-CM

## 2017-10-11 DIAGNOSIS — I4891 Unspecified atrial fibrillation: Secondary | ICD-10-CM | POA: Diagnosis not present

## 2017-10-11 LAB — POCT INR: INR: 2.8 (ref 2.0–3.0)

## 2017-10-11 NOTE — Patient Instructions (Signed)
Description   Continue same dose 1.5 tablets (7.5mg) daily. Call us with any new medications scheduled for any procedures or any questions.  Recheck in 6 weeks. Phone [phone] [facility]  Main # [phone].      

## 2017-11-22 ENCOUNTER — Ambulatory Visit: Payer: PPO | Admitting: *Deleted

## 2017-11-22 DIAGNOSIS — I4891 Unspecified atrial fibrillation: Secondary | ICD-10-CM | POA: Diagnosis not present

## 2017-11-22 DIAGNOSIS — Z5181 Encounter for therapeutic drug level monitoring: Secondary | ICD-10-CM | POA: Diagnosis not present

## 2017-11-22 LAB — POCT INR: INR: 2.8 (ref 2.0–3.0)

## 2017-11-22 NOTE — Patient Instructions (Signed)
Description   Continue same dose 1.5 tablets (7.5mg) daily. Call us with any new medications scheduled for any procedures or any questions.  Recheck in 6 weeks. Phone [phone] [facility]  Main # [phone].      

## 2017-12-01 ENCOUNTER — Other Ambulatory Visit: Payer: Self-pay | Admitting: Internal Medicine

## 2018-01-03 ENCOUNTER — Ambulatory Visit: Payer: PPO | Admitting: *Deleted

## 2018-01-03 DIAGNOSIS — I4891 Unspecified atrial fibrillation: Secondary | ICD-10-CM | POA: Diagnosis not present

## 2018-01-03 DIAGNOSIS — Z5181 Encounter for therapeutic drug level monitoring: Secondary | ICD-10-CM | POA: Diagnosis not present

## 2018-01-03 LAB — POCT INR: INR: 3.1 — AB (ref 2.0–3.0)

## 2018-01-03 NOTE — Patient Instructions (Signed)
Description   Today only take 1/2 tablet, then Continue same dose 1.5 tablets (7.5mg ) daily. Call us with any new medications scheduled for any procedures or any questions.  Recheck in 6 weeks. Phone [phone] [facility]  Main # [phone].

## 2018-01-06 ENCOUNTER — Other Ambulatory Visit: Payer: Self-pay | Admitting: Family Medicine

## 2018-02-09 ENCOUNTER — Other Ambulatory Visit: Payer: Self-pay | Admitting: Family Medicine

## 2018-02-14 ENCOUNTER — Ambulatory Visit: Payer: PPO | Admitting: Pharmacist

## 2018-02-14 DIAGNOSIS — I4891 Unspecified atrial fibrillation: Secondary | ICD-10-CM | POA: Diagnosis not present

## 2018-02-14 DIAGNOSIS — Z5181 Encounter for therapeutic drug level monitoring: Secondary | ICD-10-CM | POA: Diagnosis not present

## 2018-02-14 LAB — POCT INR: INR: 2.5 (ref 2.0–3.0)

## 2018-02-14 NOTE — Patient Instructions (Signed)
Continue same dose 1.5 tablets (7.5mg ) daily. Call us with any new medications scheduled for any procedures or any questions.  Recheck in 6 weeks. Phone [phone] [facility]  Main # [phone].

## 2018-03-28 ENCOUNTER — Ambulatory Visit: Payer: PPO | Admitting: *Deleted

## 2018-03-28 DIAGNOSIS — I4891 Unspecified atrial fibrillation: Secondary | ICD-10-CM | POA: Diagnosis not present

## 2018-03-28 DIAGNOSIS — Z5181 Encounter for therapeutic drug level monitoring: Secondary | ICD-10-CM

## 2018-03-28 LAB — POCT INR: INR: 2.1 (ref 2.0–3.0)

## 2018-03-28 NOTE — Patient Instructions (Signed)
Description   Continue same dose 1.5 tablets (7.5mg ) daily. Call us with any new medications scheduled for any procedures or any questions.  Recheck in 6 weeks. Phone [phone] [facility]  Main # [phone].

## 2018-04-05 ENCOUNTER — Other Ambulatory Visit: Payer: Self-pay | Admitting: Internal Medicine

## 2018-04-05 NOTE — Telephone Encounter (Signed)
Please review for refill, Thanks !  

## 2018-04-06 ENCOUNTER — Other Ambulatory Visit: Payer: Self-pay | Admitting: Family Medicine

## 2018-05-09 ENCOUNTER — Ambulatory Visit (INDEPENDENT_AMBULATORY_CARE_PROVIDER_SITE_OTHER): Payer: PPO | Admitting: *Deleted

## 2018-05-09 DIAGNOSIS — I4891 Unspecified atrial fibrillation: Secondary | ICD-10-CM | POA: Diagnosis not present

## 2018-05-09 DIAGNOSIS — Z5181 Encounter for therapeutic drug level monitoring: Secondary | ICD-10-CM

## 2018-05-09 LAB — POCT INR: INR: 2.9 (ref 2.0–3.0)

## 2018-05-09 NOTE — Patient Instructions (Signed)
Description   Continue same dose 1.5 tablets (7.5mg ) daily. Call us with any new medications scheduled for any procedures or any questions.  Recheck in 6 weeks. Phone [phone] [facility]  Main # [phone] fax [phone]

## 2018-06-19 ENCOUNTER — Telehealth: Payer: Self-pay

## 2018-06-19 NOTE — Telephone Encounter (Signed)
lmom for prescreen/drive up/reschedule

## 2018-06-20 ENCOUNTER — Ambulatory Visit (INDEPENDENT_AMBULATORY_CARE_PROVIDER_SITE_OTHER): Payer: PPO | Admitting: Pharmacist

## 2018-06-20 ENCOUNTER — Other Ambulatory Visit: Payer: Self-pay

## 2018-06-20 DIAGNOSIS — Z5181 Encounter for therapeutic drug level monitoring: Secondary | ICD-10-CM

## 2018-06-20 DIAGNOSIS — I4821 Permanent atrial fibrillation: Secondary | ICD-10-CM

## 2018-06-20 DIAGNOSIS — I4891 Unspecified atrial fibrillation: Secondary | ICD-10-CM

## 2018-06-20 LAB — POCT INR: INR: 2.9 (ref 2.0–3.0)

## 2018-06-20 NOTE — Telephone Encounter (Signed)

## 2018-06-20 NOTE — Telephone Encounter (Signed)
Called patient again to move appointment up. Patient states he doesn't know if he can come before 1. His nephew is bringing him and he is working. Offered to move the appointment to tomorrow when we have later afternoon appointments. Patient refused. Patient states he wished we called him yesterday. I informed him that we did call him yesterday and left a voicemail. Patient to call his nephew and call back.

## 2018-07-05 ENCOUNTER — Other Ambulatory Visit: Payer: Self-pay | Admitting: Family Medicine

## 2018-07-06 ENCOUNTER — Other Ambulatory Visit: Payer: Self-pay | Admitting: Internal Medicine

## 2018-07-11 NOTE — Telephone Encounter (Signed)
Pt is calling and needs refills on medication requested by pharm on 07/05/2018

## 2018-08-01 ENCOUNTER — Encounter: Payer: Self-pay | Admitting: Family Medicine

## 2018-08-01 ENCOUNTER — Ambulatory Visit (INDEPENDENT_AMBULATORY_CARE_PROVIDER_SITE_OTHER): Payer: PPO | Admitting: Family Medicine

## 2018-08-01 ENCOUNTER — Other Ambulatory Visit: Payer: Self-pay

## 2018-08-01 DIAGNOSIS — I10 Essential (primary) hypertension: Secondary | ICD-10-CM

## 2018-08-01 DIAGNOSIS — E785 Hyperlipidemia, unspecified: Secondary | ICD-10-CM

## 2018-08-01 DIAGNOSIS — I4821 Permanent atrial fibrillation: Secondary | ICD-10-CM | POA: Diagnosis not present

## 2018-08-01 DIAGNOSIS — K5909 Other constipation: Secondary | ICD-10-CM

## 2018-08-01 DIAGNOSIS — R7303 Prediabetes: Secondary | ICD-10-CM | POA: Diagnosis not present

## 2018-08-01 MED ORDER — DOXAZOSIN MESYLATE 4 MG PO TABS: 4.0000 mg | ORAL_TABLET | Freq: Every day | ORAL | 11 refills | Status: DC

## 2018-08-01 MED ORDER — EZETIMIBE 10 MG PO TABS: 10.0000 mg | ORAL_TABLET | Freq: Every day | ORAL | 11 refills | Status: DC

## 2018-08-01 MED ORDER — LUBIPROSTONE 24 MCG PO CAPS: 24.0000 ug | ORAL_CAPSULE | Freq: Two times a day (BID) | ORAL | 11 refills | Status: DC

## 2018-08-01 MED ORDER — CLONIDINE HCL 0.2 MG PO TABS: 0.2000 mg | ORAL_TABLET | Freq: Two times a day (BID) | ORAL | 11 refills | Status: DC

## 2018-08-01 MED ORDER — HYDROCHLOROTHIAZIDE 25 MG PO TABS: 25.0000 mg | ORAL_TABLET | Freq: Every day | ORAL | 11 refills | Status: DC

## 2018-08-01 MED ORDER — LOSARTAN POTASSIUM 100 MG PO TABS: 100.0000 mg | ORAL_TABLET | Freq: Every day | ORAL | 11 refills | Status: DC

## 2018-08-01 MED ORDER — AMLODIPINE BESYLATE 10 MG PO TABS: 10.0000 mg | ORAL_TABLET | Freq: Every day | ORAL | 11 refills | Status: DC

## 2018-08-01 MED ORDER — CARVEDILOL 25 MG PO TABS: 25.0000 mg | ORAL_TABLET | Freq: Two times a day (BID) | ORAL | 11 refills | Status: DC

## 2018-08-01 NOTE — Progress Notes (Signed)
   Subjective:    Patient ID: [name], male    DOB: [birth year], 68 y.o.   MRN: [id number]  HPI Virtual Visit via Telephone Note  I connected with the patient on 08/01/18 at  1:00 PM EDT by telephone and verified that I am speaking with the correct person using two identifiers. We attempted to connect virtually but we had technical difficulties with the audio and video.    I discussed the limitations, risks, security and privacy concerns of performing an evaluation and management service by telephone and the availability of in person appointments. I also discussed with the patient that there may be a patient responsible charge related to this service. The patient expressed understanding and agreed to proceed.  Location patient: home Location provider: work or home office Participants present for the call: patient, provider Patient did not have a visit in the prior 7 days to address this/these issue(s).   History of Present Illness: Here for medication refills. He feels well and his BP has been stable. He normally sees [place] and Dr. Harrington Challenger yearly in May, but since he is concerned about possible exposure to the Covid-19 virus, he is staying home for the time being. He has his medications and his groceries delivered to his home.   Observations/Objective: Patient sounds cheerful and well on the phone. I do not appreciate any SOB. Speech and thought processing are grossly intact. Patient reported vitals:  Assessment and Plan: He seems to be doing well as far as HTN and atrial fibrillation go. Hopefully we can get him in the clinic for an exam and lab work sometime in the near future.  Alysia Penna, MD   Follow Up Instructions:     [phone] 5-10 [phone] 11-20 9443 21-30 I did not refer this patient for an OV in the next 24 hours for this/these issue(s).  I discussed the assessment and treatment plan with the patient. The patient was provided an opportunity to ask questions and all were  answered. The patient agreed with the plan and demonstrated an understanding of the instructions.   The patient was advised to call back or seek an in-person evaluation if the symptoms worsen or if the condition fails to improve as anticipated.  I provided 13 minutes of non-face-to-face time during this encounter.   Alysia Penna, MD    Review of Systems     Objective:   Physical Exam        Assessment & Plan:

## 2018-08-04 ENCOUNTER — Other Ambulatory Visit: Payer: Self-pay | Admitting: Family Medicine

## 2018-08-06 NOTE — Telephone Encounter (Signed)
Pt calling to find out why this medication didn't get refilled.

## 2018-08-09 NOTE — Telephone Encounter (Signed)
Pt calling wanting to know status of his refill request.

## 2018-08-15 ENCOUNTER — Other Ambulatory Visit: Payer: Self-pay

## 2018-08-15 ENCOUNTER — Ambulatory Visit (INDEPENDENT_AMBULATORY_CARE_PROVIDER_SITE_OTHER): Payer: PPO | Admitting: Pharmacist

## 2018-08-15 DIAGNOSIS — Z5181 Encounter for therapeutic drug level monitoring: Secondary | ICD-10-CM | POA: Diagnosis not present

## 2018-08-15 DIAGNOSIS — I4891 Unspecified atrial fibrillation: Secondary | ICD-10-CM

## 2018-08-15 LAB — POCT INR: INR: 2.6 (ref 2.0–3.0)

## 2018-08-15 NOTE — Patient Instructions (Signed)
Description   Continue same dose 1.5 tablets (7.5mg ) daily. Call us with any new medications scheduled for any procedures or any questions.  Recheck in 9 weeks. Phone [phone] [facility]  Main # [phone] fax [phone]

## 2018-09-03 ENCOUNTER — Other Ambulatory Visit: Payer: Self-pay | Admitting: Family Medicine

## 2018-10-02 ENCOUNTER — Other Ambulatory Visit: Payer: Self-pay | Admitting: Internal Medicine

## 2018-10-18 ENCOUNTER — Ambulatory Visit (INDEPENDENT_AMBULATORY_CARE_PROVIDER_SITE_OTHER): Payer: PPO | Admitting: *Deleted

## 2018-10-18 ENCOUNTER — Other Ambulatory Visit: Payer: Self-pay

## 2018-10-18 DIAGNOSIS — Z5181 Encounter for therapeutic drug level monitoring: Secondary | ICD-10-CM

## 2018-10-18 DIAGNOSIS — I4891 Unspecified atrial fibrillation: Secondary | ICD-10-CM | POA: Diagnosis not present

## 2018-10-18 LAB — POCT INR: INR: 2.7 (ref 2.0–3.0)

## 2018-10-18 NOTE — Patient Instructions (Signed)
Description   Continue same dose 1.5 tablets (7.5mg ) daily. Call us with any new medications scheduled for any procedures or any questions.  Recheck in 9 weeks. Phone [phone] [facility]  Main # [phone] fax [phone]

## 2018-12-02 ENCOUNTER — Other Ambulatory Visit: Payer: Self-pay | Admitting: Family Medicine

## 2018-12-20 ENCOUNTER — Ambulatory Visit (INDEPENDENT_AMBULATORY_CARE_PROVIDER_SITE_OTHER): Payer: PPO | Admitting: *Deleted

## 2018-12-20 ENCOUNTER — Other Ambulatory Visit: Payer: Self-pay

## 2018-12-20 DIAGNOSIS — Z5181 Encounter for therapeutic drug level monitoring: Secondary | ICD-10-CM

## 2018-12-20 DIAGNOSIS — I4891 Unspecified atrial fibrillation: Secondary | ICD-10-CM

## 2018-12-20 LAB — POCT INR: INR: 2.2 (ref 2.0–3.0)

## 2018-12-20 NOTE — Patient Instructions (Signed)
Description   Continue same dose 1.5 tablets (7.5mg ) daily. Call us with any new medications scheduled for any procedures or any questions.  Recheck in 10 weeks. Phone [phone] [facility]  Main # [phone] fax [phone]

## 2019-01-25 ENCOUNTER — Telehealth: Payer: Self-pay

## 2019-01-25 NOTE — Telephone Encounter (Signed)
Spoke with pt and he wanted an appt date after the new year due to his transportation issues. Pt is scheduled in January.

## 2019-02-01 ENCOUNTER — Other Ambulatory Visit: Payer: Self-pay | Admitting: Family Medicine

## 2019-02-27 ENCOUNTER — Other Ambulatory Visit: Payer: Self-pay

## 2019-02-27 ENCOUNTER — Ambulatory Visit (INDEPENDENT_AMBULATORY_CARE_PROVIDER_SITE_OTHER): Payer: PPO | Admitting: *Deleted

## 2019-02-27 DIAGNOSIS — I4891 Unspecified atrial fibrillation: Secondary | ICD-10-CM | POA: Diagnosis not present

## 2019-02-27 DIAGNOSIS — Z5181 Encounter for therapeutic drug level monitoring: Secondary | ICD-10-CM | POA: Diagnosis not present

## 2019-02-27 LAB — POCT INR: INR: 2.9 (ref 2.0–3.0)

## 2019-02-27 MED ORDER — WARFARIN SODIUM 5 MG PO TABS: ORAL_TABLET | ORAL | 2 refills | Status: DC

## 2019-02-27 NOTE — Patient Instructions (Signed)
Description   Continue same dose 1.5 tablets (7.5mg ) daily. Call us with any new medications scheduled for any procedures or any questions.  Recheck in 10 weeks. Phone [phone] [facility]  Main # [phone] fax [phone]

## 2019-03-02 ENCOUNTER — Other Ambulatory Visit: Payer: Self-pay | Admitting: Family Medicine

## 2019-03-29 ENCOUNTER — Telehealth (INDEPENDENT_AMBULATORY_CARE_PROVIDER_SITE_OTHER): Payer: PPO | Admitting: Family Medicine

## 2019-03-29 ENCOUNTER — Encounter: Payer: Self-pay | Admitting: Internal Medicine

## 2019-03-29 ENCOUNTER — Other Ambulatory Visit: Payer: Self-pay

## 2019-03-29 ENCOUNTER — Ambulatory Visit (INDEPENDENT_AMBULATORY_CARE_PROVIDER_SITE_OTHER): Payer: PPO | Admitting: Internal Medicine

## 2019-03-29 VITALS — BP 128/78 | HR 86 | Ht 70.0 in | Wt 280.0 lb

## 2019-03-29 DIAGNOSIS — I4891 Unspecified atrial fibrillation: Secondary | ICD-10-CM | POA: Diagnosis not present

## 2019-03-29 DIAGNOSIS — E785 Hyperlipidemia, unspecified: Secondary | ICD-10-CM | POA: Diagnosis not present

## 2019-03-29 DIAGNOSIS — Z7901 Long term (current) use of anticoagulants: Secondary | ICD-10-CM

## 2019-03-29 DIAGNOSIS — K5909 Other constipation: Secondary | ICD-10-CM | POA: Diagnosis not present

## 2019-03-29 NOTE — Patient Instructions (Signed)
Medication Instructions:  No changes *If you need a refill on your cardiac medications before your next appointment, please call your pharmacy*  Lab Work: Today: cbc, bmet, tsh, lipids If you have labs (blood work) drawn today and your tests are completely normal, you will receive your results only by: Marland Kitchen MyChart Message (if you have MyChart) OR . A paper copy in the mail If you have any lab test that is abnormal or we need to change your treatment, we will call you to review the results.  Testing/Procedures: none  Follow-Up: At [facility], you and your health needs are our priority.  As part of our continuing mission to provide you with exceptional heart care, we have created designated Provider Care Teams.  These Care Teams include your primary Cardiologist (physician) and Advanced Practice Providers (APPs -  Physician Assistants and Nurse Practitioners) who all work together to provide you with the care you need, when you need it.  Your next appointment:   10 month(s)  The format for your next appointment:   In Person  Provider:   You may see [name], MD or one of the following Advanced Practice Providers on your designated Care Team:    [place]son Dopp, PA-C  [place], [place]  Daune Perch, NP   Other Instructions

## 2019-03-29 NOTE — Progress Notes (Signed)
Cardiology Office Note   Date:  03/29/2019   ID:  [name], DOB 03/14/1950, MRN [id number]  PCP:  [name], MD  Cardiologist:   [name], MD   Pt presents for f/u of atrial fibrillation     History of Present Illness: [name] is a 69 y.o. male with a history of  mitral regurgitation, hypertension, atrial fibrillation, obstructive sleep apnea, morbid obesity.  Isaw the pt in clinic in 2018  He was seen by [name] in May 2019  He denies CP   Breathing is stable   Denies palpitations   No dizziness.    His activity is severely limited because of orthopedic issues         Current Meds  Medication Sig  . amLODipine (NORVASC) 10 MG tablet Take 1 tablet (10 mg total) by mouth daily.  . carvedilol (COREG) 25 MG tablet Take 1 tablet (25 mg total) by mouth 2 (two) times daily.  . cloNIDine (CATAPRES) 0.2 MG tablet Take 1 tablet (0.2 mg total) by mouth 2 (two) times daily.  Marland Kitchen doxazosin (CARDURA) 4 MG tablet Take 1 tablet (4 mg total) by mouth daily.  Marland Kitchen ezetimibe (ZETIA) 10 MG tablet Take 1 tablet (10 mg total) by mouth daily.  . fish oil-omega-3 fatty acids 1000 MG capsule Take 1 g by mouth daily.    . hydrochlorothiazide (HYDRODIURIL) 25 MG tablet Take 1 tablet (25 mg total) by mouth daily.  Marland Kitchen losartan (COZAAR) 100 MG tablet Take 1 tablet (100 mg total) by mouth daily.  Marland Kitchen lubiprostone (AMITIZA) 24 MCG capsule Take 1 capsule (24 mcg total) by mouth 2 (two) times daily.  . meloxicam (MOBIC) 15 MG tablet TAKE ONE TABLET BY MOUTH DAILY WITH FOOD  . VITAMIN D, CHOLECALCIFEROL, PO Take 5,000 Units by mouth daily.  Marland Kitchen warfarin (COUMADIN) 5 MG tablet Take as directed by the coumadin clinic     Allergies:   Statins and Penicillins   Past Medical History:  Diagnosis Date  . Adenomatous colon polyp 07/2003  . Atrial fibrillation [facility])    sees Dr. [name]   . Heart valve problem    Dr [name]  . Hypertension   . Mitral valve disorders(424.0)   . Obesity   .  OSA (obstructive sleep apnea)    cpap  . Other and unspecified hyperlipidemia   . Sleep apnea     Past Surgical History:  Procedure Laterality Date  . COLONOSCOPY  06-09-15   per Dr. Fuller Plan, adenomatous polyp, repeat in 5 yrs   . FOOT SURGERY  at age 15  . LUMBAR LAMINECTOMY/DECOMPRESSION MICRODISCECTOMY N/A 11/21/2012   Procedure: Lumbar Three to Lumbar Five Laminectomy;  Surgeon: Floyce Stakes, MD;  Location: [facility] OR[facility];  Service: Neurosurgery;  Laterality: N/A;  Lumbar Three to Lumbar Five Laminectomy     Social History:  The patient  reports that he has never smoked. He has never used smokeless tobacco. He reports that he does not drink alcohol or use drugs.   Family History:  The patient's family history includes Alzheimer's disease in his maternal aunt and mother; Heart disease in his father; Hypertension in his father; Stroke in his father.    ROS:  Please see the history of present illness. All other systems are reviewed and  Negative to the above problem except as noted.    PHYSICAL EXAM: VS:  BP 128/78   Pulse 86   Ht 5\' 10"  (1.778 m)  Wt 280 lb (127 kg)   BMI 40.18 kg/m   GEN: Morbidly obese 69 yo in no acute distress  HEENT: normal  Neck: no JVD, carotid bruits Cardiac:  Irreg irreg ; no murmurs, rubs, or gallops,  Tr edema  Respiratory:  clear to auscultation bilaterally, normal work of breathing GI: soft, nontender, nondistended, + BS  No hepatomegaly  MS: no deformity Moving all extremities   Skin: warm and dry, no rash Neuro:  Strength and sensation are intact Psych: euthymic mood, full affect   EKG:  EKG is ordered today.Atrial fibrillation 86 bpm  Nonspecific ST changes    Lipid Panel    Component Value Date/Time   CHOL 185 07/20/2017 0858   TRIG 110.0 07/20/2017 0858   HDL 35.20 (L) 07/20/2017 0858   CHOLHDL 5 07/20/2017 0858   VLDL 22.0 07/20/2017 0858   LDLCALC 127 (H) 07/20/2017 0858   LDLDIRECT 168.0 03/25/2014 0844      Wt Readings  from Last 3 Encounters:  03/29/19 280 lb (127 kg)  07/25/17 283 lb (128.4 kg)  07/20/17 281 lb (127.5 kg)      ASSESSMENT AND PLAN:  1  Atrial fibrillation  Permanent COntinue on rate control and coumadin   Will check labs   2   Mitral valve dz  Will follow with periodic echoes   His MR has been difficult to grade in the past Eccentric Trivial on echo   Follow for now    3   HTN  Continue on current regimen  4   OSA  Intolerant to CPAP  5  Morbid obesity     Current medicines are reviewed at length with the patient today.  The patient does not have concerns regarding medicines.  Signed, [name], MD  03/29/2019 4:37 PM    [facility] [place], [place], [place]  [address] Phone: [phone]; Fax: [phone]

## 2019-03-29 NOTE — Progress Notes (Signed)
Virtual Visit via Telephone Note  I connected with the patient on 03/29/19 at  1:00 PM EST by telephone and verified that I am speaking with the correct person using two identifiers.   I discussed the limitations, risks, security and privacy concerns of performing an evaluation and management service by telephone and the availability of in person appointments. I also discussed with the patient that there may be a patient responsible charge related to this service. The patient expressed understanding and agreed to proceed.  Location patient: home Location provider: work or home office Participants present for the call: patient, provider Patient did not have a visit in the prior 7 days to address this/these issue(s).   History of Present Illness: Here asking about constipation. He has dealt with this for years and he tries to consume plenty of fiber and water evy day. He also takes Amitiza BID. However a few weeks ago this got worse and he felt backed up and bloated. He was having to push hard to have a BM. Then he remembered that several weeks ago he began to take Coricidin OTC for head congestion. He saw where constipation can be a side effect of this, so he stopped taking a few days ago. Then last night he passed a very large formed stool, and today he feels much relief. He asks what to do from here.    Observations/Objective: Patient sounds cheerful and well on the phone. I do not appreciate any SOB. Speech and thought processing are grossly intact. Patient reported vitals:  Assessment and Plan: He seems to have found the cause of the constipation, so he will stay away from this product in the future. Recheck prn.  Alysia Penna, MD   Follow Up Instructions:     [phone] 21-30 I did not refer this patient for an OV in the next 24 hours for this/these issue(s).  I discussed the assessment and treatment plan with the patient. The patient was provided an  opportunity to ask questions and all were answered. The patient agreed with the plan and demonstrated an understanding of the instructions.   The patient was advised to call back or seek an in-person evaluation if the symptoms worsen or if the condition fails to improve as anticipated.  I provided 11 minutes of non-face-to-face time during this encounter.   Alysia Penna, MD

## 2019-03-30 LAB — LIPID PANEL
Chol/HDL Ratio: 4.1 ratio (ref 0.0–5.0)
Cholesterol, Total: 163 mg/dL (ref 100–199)
HDL: 40 mg/dL (ref >39)
LDL Chol Calc (NIH): 103 mg/dL — ABNORMAL HIGH (ref 0–99)
Triglycerides: 110 mg/dL (ref 0–149)
VLDL Cholesterol Cal: 20 mg/dL (ref 5–40)

## 2019-03-30 LAB — CBC
Hematocrit: 40.7 % (ref 37.5–51.0)
Hemoglobin: 13.7 g/dL (ref 13.0–17.7)
MCH: 30.6 pg (ref 26.6–33.0)
MCHC: 33.7 g/dL (ref 31.5–35.7)
MCV: 91 fL (ref 79–97)
Platelets: 237 10*3/uL (ref 150–450)
RBC: 4.47 x10E6/uL (ref 4.14–5.80)
RDW: 12.7 % (ref 11.6–15.4)
WBC: 11.6 10*3/uL — ABNORMAL HIGH (ref 3.4–10.8)

## 2019-03-30 LAB — BASIC METABOLIC PANEL
BUN/Creatinine Ratio: 20 (ref 10–24)
BUN: 34 mg/dL — ABNORMAL HIGH (ref 8–27)
CO2: 24 mmol/L (ref 20–29)
Calcium: 11.5 mg/dL — ABNORMAL HIGH (ref 8.6–10.2)
Chloride: 99 mmol/L (ref 96–106)
Creatinine, Ser: 1.73 mg/dL — ABNORMAL HIGH (ref 0.76–1.27)
GFR calc Af Amer: 46 mL/min/{1.73_m2} — ABNORMAL LOW (ref >59)
GFR calc non Af Amer: 39 mL/min/{1.73_m2} — ABNORMAL LOW (ref >59)
Glucose: 104 mg/dL — ABNORMAL HIGH (ref 65–99)
Potassium: 4.8 mmol/L (ref 3.5–5.2)
Sodium: 139 mmol/L (ref 134–144)

## 2019-03-30 LAB — TSH: TSH: 1.56 u[IU]/mL (ref 0.450–4.500)

## 2019-04-01 ENCOUNTER — Other Ambulatory Visit: Payer: Self-pay | Admitting: Family Medicine

## 2019-04-02 ENCOUNTER — Telehealth: Payer: Self-pay | Admitting: *Deleted

## 2019-04-02 DIAGNOSIS — Z79899 Other long term (current) drug therapy: Secondary | ICD-10-CM

## 2019-04-02 DIAGNOSIS — R011 Cardiac murmur, unspecified: Secondary | ICD-10-CM

## 2019-04-02 DIAGNOSIS — E785 Hyperlipidemia, unspecified: Secondary | ICD-10-CM

## 2019-04-02 MED ORDER — HYDROCHLOROTHIAZIDE 12.5 MG PO CAPS: 12.5000 mg | ORAL_CAPSULE | Freq: Every day | ORAL | 3 refills | Status: DC

## 2019-04-02 NOTE — Telephone Encounter (Signed)
-----   Message from Fay Records, MD sent at 03/31/2019  3:21 PM EST ----- CBC is OK Thyroid function is normal Cr is elevated compared to previous   Cut back on HCTZ to 12.5 mg    F/U bmet in 2 wkss  LDL is 103  Watch diet Would set up for carotid USN   R/O CV plaquing    Dx murmur

## 2019-04-05 ENCOUNTER — Other Ambulatory Visit: Payer: Self-pay

## 2019-04-05 ENCOUNTER — Ambulatory Visit (HOSPITAL_COMMUNITY)
Admission: RE | Admit: 2019-04-05 | Discharge: 2019-04-05 | Disposition: A | Discharge status: Home / Self Care | Payer: PPO | Source: Ambulatory Visit | Attending: Internal Medicine | Admitting: Internal Medicine

## 2019-04-05 DIAGNOSIS — E785 Hyperlipidemia, unspecified: Secondary | ICD-10-CM | POA: Diagnosis not present

## 2019-04-05 DIAGNOSIS — R011 Cardiac murmur, unspecified: Secondary | ICD-10-CM | POA: Insufficient documentation

## 2019-04-17 ENCOUNTER — Other Ambulatory Visit (INDEPENDENT_AMBULATORY_CARE_PROVIDER_SITE_OTHER): Payer: PPO

## 2019-04-17 ENCOUNTER — Other Ambulatory Visit: Payer: Self-pay

## 2019-04-17 DIAGNOSIS — Z79899 Other long term (current) drug therapy: Secondary | ICD-10-CM

## 2019-04-18 LAB — BASIC METABOLIC PANEL
BUN/Creatinine Ratio: 25 — ABNORMAL HIGH (ref 10–24)
BUN: 30 mg/dL — ABNORMAL HIGH (ref 8–27)
CO2: 21 mmol/L (ref 20–29)
Calcium: 11.4 mg/dL — ABNORMAL HIGH (ref 8.6–10.2)
Chloride: 100 mmol/L (ref 96–106)
Creatinine, Ser: 1.18 mg/dL (ref 0.76–1.27)
GFR calc Af Amer: 72 mL/min/{1.73_m2} (ref >59)
GFR calc non Af Amer: 63 mL/min/{1.73_m2} (ref >59)
Glucose: 104 mg/dL — ABNORMAL HIGH (ref 65–99)
Potassium: 4.2 mmol/L (ref 3.5–5.2)
Sodium: 140 mmol/L (ref 134–144)

## 2019-04-19 ENCOUNTER — Telehealth: Payer: Self-pay

## 2019-04-19 ENCOUNTER — Other Ambulatory Visit: Payer: PPO

## 2019-04-19 NOTE — Telephone Encounter (Signed)
-----   Message from Fay Records, MD sent at 04/19/2019  8:55 AM EST ----- Electrolytes and kidney function are OK   creatinine is better .Keep on same doses   Follow BP

## 2019-04-19 NOTE — Telephone Encounter (Signed)
The patient has been notified of the result and verbalized understanding.  All questions (if any) were answered. Wilma Flavin, RN 04/19/2019 9:22 AM

## 2019-04-26 ENCOUNTER — Telehealth: Payer: Self-pay | Admitting: Pharmacist

## 2019-04-26 NOTE — Telephone Encounter (Addendum)
Patient returned call. He states he is not interested in medication for his cholesterol. He is happy with LDL of 103. I did advise that Dr. Harrington Challenger would like to be a little more aggressive due to the findings of plaque buildup in his carotid arteries. He states that he did research on the side effects of Repatha and found that it causes back pain. Patient states that he lives in chronic pain and needs a walker to get around, he cannot risk more pain. I did offer to discuss Nelexetol, but patient declined at this time. He states that he eats mostly plant based and wants to work on his diet. He is looking into a machine that he can pedal sitting in his chair. Patient would like to readdress this in 9 months when we sees Dr. Harrington Challenger again.  Declined appointment in lipid clinic at this time.

## 2019-04-26 NOTE — Telephone Encounter (Signed)
Left VM for patient to return call. Calling to schedule patient in the PharmD lipid clinic per request of Dr. Harrington Challenger.   Repatha has been approved by insurance. Plan to discuss PCSK9i therapy with patient.

## 2019-04-30 ENCOUNTER — Telehealth: Payer: Self-pay | Admitting: Family Medicine

## 2019-04-30 NOTE — Chronic Care Management (AMB) (Signed)
  Chronic Care Management   Note  04/30/2019 Name: [name] MRN: [id number] DOB: 01-02-1951  [name] is a 69 y.o. year old male who is a primary care patient of [name], MD. I reached out to Pedro Earls by phone today in response to a referral sent by [name]'s PCP, [name], MD.   [name] was given information about Chronic Care Management services today including:  1. CCM service includes personalized support from designated clinical staff supervised by his physician, including individualized plan of care and coordination with other care providers 2. 24/7 contact phone numbers for assistance for urgent and routine care needs. 3. Service will only be billed when office clinical staff spend 20 minutes or more in a month to coordinate care. 4. Only one practitioner may furnish and bill the service in a calendar month. 5. The patient may stop CCM services at any time (effective at the end of the month) by phone call to the office staff. 6. The patient will be responsible for cost sharing (co-pay) of up to 20% of the service fee (after annual deductible is met).  Patient agreed to services and verbal consent obtained.   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

## 2019-05-01 ENCOUNTER — Other Ambulatory Visit: Payer: Self-pay | Admitting: Family Medicine

## 2019-05-07 NOTE — Chronic Care Management (AMB) (Signed)
Chronic Care Management Pharmacy  Name: [name]  MRN: [id number] DOB: [birth year]  Initial Questions: 1. Have you seen any other providers since your last visit? No  2. Any changes in your medicines or health? No   Chief Complaint/ HPI  [name],  69 y.o. , male presents for their Initial CCM visit with the clinical pharmacist via telephone due to COVID-19 Pandemic.  PCP : [name], MD  Their chronic conditions include: Afib, HTN, Prediabetes, HLD, Constipation, Vitamin D deficiency, Right hip pain   Office Visits: 03/29/2019- Patient had visit via telemedicine with Dr. Alysia Penna, MD for constipation. Patient on Amitiza BID. Constipation resolved after patient patient stopped Coricidin OTC for head congestion. Patient instructed to stay away from this product in the future.   Consult Visit: 04/26/2019- Cardiology- Patient declined visit with PharmD lipid clinic. Patient would like to continue modifying diet before adding additional therapy for LDL. LDL 103 and recent Carotid US showed plaque buildup.   03/29/2019- Cardiology- Patient presented in office to Dr. [name], MD for afib follow up. Permanent afib. Patient to continue on rate control and coumadin (mitral valve disease). Patient to continue current regimen for HTN. For OSA, patient has been intolerant to CPAP.   02/27/2019- Cardiology- Patient presented for Anti-coag visit. INR: 2.9. Patient to continue warfarin 7.5mg  every day.   Medications: Outpatient Encounter Medications as of 05/08/2019  Medication Sig  . amLODipine (NORVASC) 10 MG tablet Take 1 tablet (10 mg total) by mouth daily.  . carvedilol (COREG) 25 MG tablet Take 1 tablet (25 mg total) by mouth 2 (two) times daily.  . cloNIDine (CATAPRES) 0.2 MG tablet Take 1 tablet (0.2 mg total) by mouth 2 (two) times daily.  Marland Kitchen doxazosin (CARDURA) 4 MG tablet Take 1 tablet (4 mg total) by mouth daily.  Marland Kitchen ezetimibe (ZETIA) 10 MG tablet Take 1 tablet (10 mg  total) by mouth daily.  . fish oil-omega-3 fatty acids 1000 MG capsule Take 1 g by mouth daily.    . hydrochlorothiazide (MICROZIDE) 12.5 MG capsule Take 1 capsule (12.5 mg total) by mouth daily.  Marland Kitchen losartan (COZAAR) 100 MG tablet Take 1 tablet (100 mg total) by mouth daily.  Marland Kitchen lubiprostone (AMITIZA) 24 MCG capsule Take 1 capsule (24 mcg total) by mouth 2 (two) times daily.  . meloxicam (MOBIC) 15 MG tablet TAKE ONE TABLET BY MOUTH DAILY WITH FOOD  . VITAMIN D, CHOLECALCIFEROL, PO Take 5,000 Units by mouth daily.  Marland Kitchen warfarin (COUMADIN) 5 MG tablet Take as directed by the coumadin clinic   No facility-administered encounter medications on file as of 05/08/2019.   Current Diagnosis/Assessment:  Goals Addressed            This Visit's Progress   . Pharmacy Care Plan       Current Barriers:  . Chronic Disease Management support, education, and care coordination needs related to Atrial Fibrillation, HTN, HLD, and Prediabetes, Constipation, Vitamin D deficiency, Right hip pain   Pharmacist Clinical Goal(s):  Marland Kitchen Afib o INR goal: 2.0-3.0 . Prediabetes o A1c goal: <6.5%  . High blood pressure o Blood pressure goal: <130/800 mmHg.  . High cholesterol:  o Cholesterol goals: Total Cholesterol goal under 200, Triglycerides goal under 150, HDL goal above 40 (men) or above 50 (women), LDL goal under 100.  . Vitamin D deficiency . Maintain Vitamin D-25 level at or above 30.  . Constipation o Continue to see improvement in constipation.  . Right  hip pain  o Continue to see improvement in pain levels.   Interventions: . Comprehensive medication review performed. . Afib . Discussed monitoring for signs and symptoms for bleeding (coughing up blood, prolonged nose bleeds, black, tarry stools.  . Continue carvedilol 25mg  1 tablet twice daily and Warfarin 7.5mg , 1 tablet once daily.  . Prediabetes o Focus on managing/monitoring CVD risk factors, including keeping blood pressure and lipids under  goal. We discussed modifying lifestyle, including to participate in moderate physical activity (e.g., walking) at least 150 minutes per week. Discussed a Mediterranean eating plan with an emphasis on whole grains, legumes, nuts, fruits, and vegetables and minimal refined and processed foods. . High blood pressure o Continue working on diet modifications and salt intake reduction.  o Continue amlodipine 10mg , 1 tablet daily, carvedilol 25mg , 1 tablet twice daily, clonidine 0.2mg , 1 tablet twice daily, doxazosin 4mg , 1 tablet once daily, HCTZ 12.5mg , 1 capsule daily, losartan 100mg , 1 tablet daily  . High cholesterol:  o  Diet high in plant sterols (fruits/vegetables/nuts/whole grains/legumes) may reduce your cholesterol.   o Encouraged increasing fiber to a daily intake of 10-25g/day.   o Continue: ezetimibe 10mg , 1 tablet daily and omega-3 fatty acids 1000mg , 1 capsule once daily   . Vitamin D deficiency . Continue vitamin D3, 5000 units once daily  . Constipation o Continue Amitiza 34mcg, 1 capsule twice daily . Right hip pain  o Continue meloxicam 15mg , 1 tablet daily  Patient Self Care Activities:  . Calls provider office for new concerns or questions . Continue current medications as directed by providers.  . Continue following up with specialists. . Continue at home blood pressure readings. . Continue working on health habits (diet/ exercise).   Initial goal documentation        AFIB  Patient is currently rate controlled. HR: 86 bpm (on last visit) unable to check at home.  Patient has failed these meds in past: none  Patient is currently controlled on the following medications:  - carvedilol 25mg , 1 tablet twice daily   Anticoagulation Warfarin 7.5mg  once daily  INR goal:2.0-3.0 Recent INR: 2.9 (02/27/2019)  We discussed:  monitoring for signs and symptoms for bleeding (coughing up blood, prolonged nose bleeds, black, tarry stools)  Plan Continue current medications.      Prediabetes   Recent Relevant Labs: Lab Results  Component Value Date/Time   HGBA1C 5.8 07/20/2017 08:58 AM   HGBA1C 5.8 07/18/2016 09:22 AM    Patient is currently controlled on the following medications:  -diet controlled (patient mentions modifying diet- avoiding white rice).   We discussed: diet and exercise extensively  Plan Continue control with diet and exercise.     Hypertension  BP today is:  <130/80.   Office blood pressures are  BP Readings from Last 3 Encounters:  03/29/19 128/78  07/25/17 130/90  07/20/17 118/86   Patient has failed these meds in the past: lisinopril, valsartan   Patient checks BP at home infrequently  Patient home BP readings are ranging:   Patient is controlled on:  - amlodipine 10mg , 1 tablet daily  - carvedilol 25mg , 1 tablet twice daily   - clonidine 0.2mg , 1 tablet twice daily  - doxazosin 4mg , 1 tablet once daily - HCTZ 12.5mg , 1 capsule daily  - losartan 100mg , 1 tablet daily   We discussed diet and exercise extensively - diet: patient mentions he has reduced meat intake. Eats plant based chicken, patties. He likes to read labels.  - sodium intake  reduction.   Plan Patient mentions dose reduction in HCTZ due to elevated cr.  Denies orthostatic hypotension.  Continue current medications.    Hyperlipidemia   Lipid Panel     Component Value Date/Time   CHOL 163 03/29/2019 1655   TRIG 110 03/29/2019 1655   HDL 40 03/29/2019 1655   CHOLHDL 4.1 03/29/2019 1655   CHOLHDL 5 07/20/2017 0858   VLDL 22.0 07/20/2017 0858   LDLCALC 103 (H) 03/29/2019 1655   LDLDIRECT 168.0 03/25/2014 0844   LABVLDL 20 03/29/2019 1655    The 10-year ASCVD risk score (Goff DC Jr., et al., 2013) is: 19.4%   Patient has failed these meds in past: atorvastatin, simvastatin (myalgias)    Patient is currently uncontrolled on the following medications: - ezetimibe 10mg , 1 tablet daily (last filled 04/01/19, 30DS)  - omega-3 fatty acids 1000mg ,  1 capsule once daily    We discussed:  diet and exercise extensively - A diet high in plant sterols (fruits/vegetables/nuts/whole grains/legumes) may reduce your cholesterol.   - Encouraged increasing fiber to a daily intake of 10-25g/day.    Plan Patient stated he is okay with 103. Patient is aware of cardiologist wanting it lowered, but does not want additional therapy at this time.  Continue current medications.   Vitamin D deficiency   Patient has failed these meds in past: none  Patient is currently controlled on the following medications:  - vitamin D3, 5000 units once daily   Vitamin D: 20.47 (10/14/2013)  Plan Continue current medications.   Constipation   Patient has failed these meds in past: biscodyl, docusate Patient is currently controlled on the following medications:  - Amitiza 40mcg, 1 capsule twice daily  We discussed:  diet modifications (incorporating fiber into diet).   - patient mentions trying multiple medications and Amitiza is the one that has worked for him.   Plan Continue current medications.   Right hip pain   Patient has failed these meds in past: naproxen, hydrocodone/ APAP  Patient is currently controlled on the following medications:  - meloxicam 15mg , 1 tablet daily   Plan Continue current medications.   Medication Management   Patient organizes medications: separates pill bottles on bathroom counter based on dosing times. (morning vs BID vs PM).  Barriers: Patient denies issues obtaining medications.    Follow up Follow up visit with PharmD in 1 year.  Will conduct general telephone calls for periodic check-ins before next visit. Patient is also overdue for annual physical with Dr. Sarajane Jews.    Anson Crofts, PharmD Clinical Pharmacist [facility] [place] [phone]

## 2019-05-08 ENCOUNTER — Ambulatory Visit: Payer: PPO

## 2019-05-08 ENCOUNTER — Telehealth: Payer: Self-pay

## 2019-05-08 ENCOUNTER — Other Ambulatory Visit: Payer: Self-pay

## 2019-05-08 DIAGNOSIS — R7303 Prediabetes: Secondary | ICD-10-CM

## 2019-05-08 DIAGNOSIS — I10 Essential (primary) hypertension: Secondary | ICD-10-CM

## 2019-05-08 DIAGNOSIS — I4821 Permanent atrial fibrillation: Secondary | ICD-10-CM

## 2019-05-08 NOTE — Telephone Encounter (Signed)
Done

## 2019-05-08 NOTE — Telephone Encounter (Signed)
I am requesting an ambulatory referral to CCM be placed for this patient. Referral will need to have 2 current diagnosis attached to it. Thank you!  

## 2019-05-09 ENCOUNTER — Other Ambulatory Visit: Payer: Self-pay

## 2019-05-09 ENCOUNTER — Ambulatory Visit (INDEPENDENT_AMBULATORY_CARE_PROVIDER_SITE_OTHER): Payer: PPO | Admitting: *Deleted

## 2019-05-09 DIAGNOSIS — Z5181 Encounter for therapeutic drug level monitoring: Secondary | ICD-10-CM | POA: Diagnosis not present

## 2019-05-09 DIAGNOSIS — I4891 Unspecified atrial fibrillation: Secondary | ICD-10-CM | POA: Diagnosis not present

## 2019-05-09 LAB — POCT INR: INR: 3.3 — AB (ref 2.0–3.0)

## 2019-05-09 NOTE — Patient Instructions (Addendum)
Visit Information  Goals Addressed            This Visit's Progress   . Pharmacy Care Plan       Current Barriers:  . Chronic Disease Management support, education, and care coordination needs related to Atrial Fibrillation, HTN, HLD, and Prediabetes, Constipation, Vitamin D deficiency, Right hip pain   Pharmacist Clinical Goal(s):  Marland Kitchen Afib o INR goal: 2.0-3.0 . Prediabetes o A1c goal: <6.5%  . High blood pressure o Blood pressure goal: <130/800 mmHg.  . High cholesterol:  o Cholesterol goals: Total Cholesterol goal under 200, Triglycerides goal under 150, HDL goal above 40 (men) or above 50 (women), LDL goal under 100.  . Vitamin D deficiency . Maintain Vitamin D-25 level at or above 30.  . Constipation o Continue to see improvement in constipation.  . Right hip pain  o Continue to see improvement in pain levels.   Interventions: . Comprehensive medication review performed. . Afib . Discussed monitoring for signs and symptoms for bleeding (coughing up blood, prolonged nose bleeds, black, tarry stools.  . Continue carvedilol 25mg  1 tablet twice daily and Warfarin 7.5mg , 1 tablet once daily.  . Prediabetes o Focus on managing/monitoring CVD risk factors, including keeping blood pressure and lipids under goal. We discussed modifying lifestyle, including to participate in moderate physical activity (e.g., walking) at least 150 minutes per week. Discussed a Mediterranean eating plan with an emphasis on whole grains, legumes, nuts, fruits, and vegetables and minimal refined and processed foods. . High blood pressure o Continue working on diet modifications and salt intake reduction.  o Continue amlodipine 10mg , 1 tablet daily, carvedilol 25mg , 1 tablet twice daily, clonidine 0.2mg , 1 tablet twice daily, doxazosin 4mg , 1 tablet once daily, HCTZ 12.5mg , 1 capsule daily, losartan 100mg , 1 tablet daily  . High cholesterol:  o  Diet high in plant sterols (fruits/vegetables/nuts/whole  grains/legumes) may reduce your cholesterol.   o Encouraged increasing fiber to a daily intake of 10-25g/day.   o Continue: ezetimibe 10mg , 1 tablet daily and omega-3 fatty acids 1000mg , 1 capsule once daily   . Vitamin D deficiency . Continue vitamin D3, 5000 units once daily  . Constipation o Continue Amitiza 53mcg, 1 capsule twice daily . Right hip pain  o Continue meloxicam 15mg , 1 tablet daily  Patient Self Care Activities:  . Calls provider office for new concerns or questions . Continue current medications as directed by providers.  . Continue following up with specialists. . Continue at home blood pressure readings. . Continue working on health habits (diet/ exercise).   Initial goal documentation        [name] was given information about Chronic Care Management services today including:  1. CCM service includes personalized support from designated clinical staff supervised by his physician, including individualized plan of care and coordination with other care providers 2. 24/7 contact phone numbers for assistance for urgent and routine care needs. 3. Service will only be billed when office clinical staff spend 20 minutes or more in a month to coordinate care. 4. Only one practitioner may furnish and bill the service in a calendar month. 5. The patient may stop CCM services at any time (effective at the end of the month) by phone call to the office staff.  Patient agreed to services and verbal consent obtained.   The patient verbalized understanding of instructions provided today and agreed to receive a mailed copy of patient instruction and/or educational materials. Telephone follow up appointment with pharmacy  team member scheduled for: 05/08/2020.   Anson Crofts, PharmD Clinical Pharmacist [facility] [phone]   [place] stands for "Dietary Approaches to Stop Hypertension." The DASH eating plan is a healthy  eating plan that has been shown to reduce high blood pressure (hypertension). It may also reduce your risk for type 2 diabetes, heart disease, and stroke. The DASH eating plan may also help with weight loss. What are tips for following this plan?  General guidelines  Avoid eating more than 2,300 mg (milligrams) of salt (sodium) a day. If you have hypertension, you may need to reduce your sodium intake to 1,500 mg a day.  Limit alcohol intake to no more than 1 drink a day for nonpregnant women and 2 drinks a day for men. One drink equals 12 oz of beer, 5 oz of wine, or 1 oz of hard liquor.  Work with your health care provider to maintain a healthy body weight or to lose weight. Ask what an ideal weight is for you.  Get at least 30 minutes of exercise that causes your heart to beat faster (aerobic exercise) most days of the week. Activities may include walking, swimming, or biking.  Work with your health care provider or diet and nutrition specialist (dietitian) to adjust your eating plan to your individual calorie needs. Reading food labels   Check food labels for the amount of sodium per serving. Choose foods with less than 5 percent of the Daily Value of sodium. Generally, foods with less than 300 mg of sodium per serving fit into this eating plan.  To find whole grains, look for the word "whole" as the first word in the ingredient list. Shopping  Buy products labeled as "low-sodium" or "no salt added."  Buy fresh foods. Avoid canned foods and premade or frozen meals. Cooking  Avoid adding salt when cooking. Use salt-free seasonings or herbs instead of table salt or sea salt. Check with your health care provider or pharmacist before using salt substitutes.  Do not fry foods. Cook foods using healthy methods such as baking, boiling, grilling, and broiling instead.  Cook with heart-healthy oils, such as olive, canola, soybean, or sunflower oil. Meal planning  Eat a balanced diet  that includes: ? 5 or more servings of fruits and vegetables each day. At each meal, try to fill half of your plate with fruits and vegetables. ? Up to 6-8 servings of whole grains each day. ? Less than 6 oz of lean meat, poultry, or fish each day. A 3-oz serving of meat is about the same size as a deck of cards. One egg equals 1 oz. ? 2 servings of low-fat dairy each day. ? A serving of nuts, seeds, or beans 5 times each week. ? Heart-healthy fats. Healthy fats called Omega-3 fatty acids are found in foods such as flaxseeds and coldwater fish, like sardines, salmon, and mackerel.  Limit how much you eat of the following: ? Canned or prepackaged foods. ? Food that is high in trans fat, such as fried foods. ? Food that is high in saturated fat, such as fatty meat. ? Sweets, desserts, sugary drinks, and other foods with added sugar. ? Full-fat dairy products.  Do not salt foods before eating.  Try to eat at least 2 vegetarian meals each week.  Eat more home-cooked food and less restaurant, buffet, and fast food.  When eating at a restaurant, ask that your food be prepared with less salt  or no salt, if possible. What foods are recommended? The items listed may not be a complete list. Talk with your dietitian about what dietary choices are best for you. Grains Whole-grain or whole-wheat bread. Whole-grain or whole-wheat pasta. Brown rice. [place]. Bulgur. Whole-grain and low-sodium cereals. Pita bread. Low-fat, low-sodium crackers. Whole-wheat flour tortillas. Vegetables Fresh or frozen vegetables (raw, steamed, roasted, or grilled). Low-sodium or reduced-sodium tomato and vegetable juice. Low-sodium or reduced-sodium tomato sauce and tomato paste. Low-sodium or reduced-sodium canned vegetables. Fruits All fresh, dried, or frozen fruit. Canned fruit in natural juice (without added sugar). Meat and other protein foods Skinless chicken or [place]. Ground chicken or [place]. Pork with  fat trimmed off. Fish and seafood. Egg whites. Dried beans, peas, or lentils. Unsalted nuts, nut butters, and seeds. Unsalted canned beans. Lean cuts of beef with fat trimmed off. Low-sodium, lean deli meat. Dairy Low-fat (1%) or fat-free (skim) milk. Fat-free, low-fat, or reduced-fat cheeses. Nonfat, low-sodium ricotta or cottage cheese. Low-fat or nonfat yogurt. Low-fat, low-sodium cheese. Fats and oils Soft margarine without trans fats. Vegetable oil. Low-fat, reduced-fat, or light mayonnaise and salad dressings (reduced-sodium). Canola, safflower, olive, soybean, and sunflower oils. Avocado. Seasoning and other foods Herbs. Spices. Seasoning mixes without salt. Unsalted popcorn and pretzels. Fat-free sweets. What foods are not recommended? The items listed may not be a complete list. Talk with your dietitian about what dietary choices are best for you. Grains Baked goods made with fat, such as croissants, muffins, or some breads. Dry pasta or rice meal packs. Vegetables Creamed or fried vegetables. Vegetables in a cheese sauce. Regular canned vegetables (not low-sodium or reduced-sodium). Regular canned tomato sauce and paste (not low-sodium or reduced-sodium). Regular tomato and vegetable juice (not low-sodium or reduced-sodium). Angie Fava. Olives. Fruits Canned fruit in a light or heavy syrup. Fried fruit. Fruit in cream or butter sauce. Meat and other protein foods Fatty cuts of meat. Ribs. Fried meat. Berniece Salines. Sausage. Bologna and other processed lunch meats. Salami. Fatback. Hotdogs. Bratwurst. Salted nuts and seeds. Canned beans with added salt. Canned or smoked fish. Whole eggs or egg yolks. Chicken or [place] with skin. Dairy Whole or 2% milk, cream, and half-and-half. Whole or full-fat cream cheese. Whole-fat or sweetened yogurt. Full-fat cheese. Nondairy creamers. Whipped toppings. Processed cheese and cheese spreads. Fats and oils Butter. Stick margarine. Lard. Shortening. Ghee. Bacon  fat. Tropical oils, such as coconut, palm kernel, or palm oil. Seasoning and other foods Salted popcorn and pretzels. Onion salt, garlic salt, seasoned salt, table salt, and sea salt. Worcestershire sauce. Tartar sauce. Barbecue sauce. Teriyaki sauce. Soy sauce, including reduced-sodium. Steak sauce. Canned and packaged gravies. Fish sauce. Oyster sauce. Cocktail sauce. Horseradish that you find on the shelf. Ketchup. Mustard. Meat flavorings and tenderizers. Bouillon cubes. Hot sauce and Tabasco sauce. Premade or packaged marinades. Premade or packaged taco seasonings. Relishes. Regular salad dressings. Where to find more information:  National Heart, Lung, and [place]: https://wilson-eaton.com/  American Heart Association: www.heart.org Summary  The DASH eating plan is a healthy eating plan that has been shown to reduce high blood pressure (hypertension). It may also reduce your risk for type 2 diabetes, heart disease, and stroke.  With the DASH eating plan, you should limit salt (sodium) intake to 2,300 mg a day. If you have hypertension, you may need to reduce your sodium intake to 1,500 mg a day.  When on the DASH eating plan, aim to eat more fresh fruits and vegetables, whole grains, lean proteins, low-fat dairy,  and heart-healthy fats.  Work with your health care provider or diet and nutrition specialist (dietitian) to adjust your eating plan to your individual calorie needs. This information is not intended to replace advice given to you by your health care provider. Make sure you discuss any questions you have with your health care provider. Document Revised: 02/03/2017 Document Reviewed: 02/15/2016 Elsevier Patient Education  2020 [place].  Chronic Constipation  Chronic constipation is a condition in which a person has three or fewer bowel movements a week, for three months or longer. This condition is especially common in older adults. The two main kinds of chronic  constipation are secondary constipation and functional constipation. Secondary constipation results from another condition or a treatment. Functional constipation, also called primary or idiopathic constipation, is divided into three types:  Normal transit constipation. In this type, movement of stool through the colon (stool transit) occurs normally.  Slow transit constipation. In this type, stool moves slowly through the colon.  Outlet constipation or pelvic floor dysfunction. In this type, the nerves and muscles that empty the rectum do not work normally. What are the causes? Causes of secondary constipation may include:  Failing to drink enough fluid, eat enough food or fiber, or get physically active.  Pregnancy.  A tear in the anus (anal fissure).  Blockage in the bowel (bowel obstruction).  Narrowing of the bowel (bowel stricture).  Having a long-term medical condition, such as: ? Diabetes. ? Hypothyroidism. ? Multiple sclerosis. ? Parkinson disease. ? Stroke. ? Spinal cord injury. ? Dementia. ? Colon cancer. ? Inflammatory bowel disease (IBD). ? Iron-deficiency anemia. ? Outward collapse of the rectum (rectal prolapse). ? Hemorrhoids.  Taking certain medicines, including: ? Narcotics. These are a certain type of prescription pain medicine. ? Antacids. ? Iron supplements. ? Water pills (diuretics). ? Certain blood pressure medicines. ? Anti-seizure medicines. ? Antidepressants. ? Medicines for Parkinson disease. The cause of functional constipation is not known, but some conditions are associated with it. These conditions include:  Stress.  Problems in the nerves and muscles that control stool transit.  Weak or impaired pelvic floor muscles. What increases the risk? You may be at higher risk for chronic constipation if you:  Are older than age 54.  Are male.  Live in a long-term care facility.  Do not get much exercise or physical activity (have a  sedentary lifestyle).  Do not drink enough fluids.  Do not eat enough food, especially fiber.  Have a long-term disease.  Have a mental health disorder or eating disorder.  Take many medicines. What are the signs or symptoms? The main symptom of chronic constipation is having three or fewer bowel movements a week for several weeks. Other signs and symptoms may vary from person to person. These include:  Pushing hard (straining) to pass stool.  Painful bowel movements.  Having hard or lumpy stools.  Having lower belly discomfort, such as cramps or bloating.  Being unable to have a bowel movement when you feel the urge.  Feeling like you still need to pass stool after a bowel movement.  Feeling that you have something in your rectum that is blocking or preventing bowel movements.  Seeing blood on the toilet paper or in your stool.  Worsening confusion (in older adults). How is this diagnosed? This condition may be diagnosed based on:  Symptoms and medical history. You will be asked about your symptoms, lifestyle, diet, and any medicines that you are taking.  Physical exam. ? Your  belly (abdomen) will be examined. ? A digital rectal exam may be done. For this exam, a health care provider places a lubricated, gloved finger into the rectum.  Other tests to check for any underlying causes of your constipation. These may be ordered if you have bleeding in your rectum, weight loss, or a family history of colon cancer. In these cases, you may have: ? Imaging studies of the colon. These may include X-ray, ultrasound, or CT scan. ? Blood tests. ? A procedure to examine the inside of your colon (colonoscopy). ? More specialized tests to check:  Whether your anal sphincter works well. This is a ring-shaped muscle that controls the closing of the anus.  How well food moves through your colon. ? Tests to measure the nerve signal in your pelvic floor muscles (electromyography). How  is this treated? Treatment for chronic constipation depends on the cause. Most often, treatment starts with:  Being more active and getting regular exercise.  Drinking more fluids.  Adding fiber to your diet. Sources of fiber include fruits, vegetables, whole grains, and fiber supplements.  Using medicines such as stool softeners or medicines that increase contractions in your digestive system (pro-motility agents).  Training your pelvic muscles with biofeedback.  Surgery, if there is obstruction. Treatment for secondary chronic constipation depends on the underlying condition. You may need to:  Stop or change some medicines if they cause constipation.  Use a fiber supplement (bulk laxative) or stool softener.  Use prescription laxative. This works by [employer] into your colon (osmotic laxative). You may also need to see a specialist who treats conditions of the digestive system (gastroenterologist). Follow these instructions at home:   Take over-the-counter and prescription medicines only as told by your health care provider.  If you are taking a laxative, take it as told by your health care provider.  Eat a balanced diet that includes enough fiber. Ask your health care provider to recommend a diet that is right for you.  Drink clear fluids, especially water. Avoid drinking alcohol, caffeine, and soda.  Drink enough fluid to keep your urine pale yellow.  Get some physical activity every day. Ask your health care provider what physical activities are safe for you.  Get colon cancer screenings as told by your health care provider.  Keep all follow-up visits as told by your health care provider. This is important. Contact a health care provider if:  You are having three or fewer bowel movements a week.  Your stools are hard or lumpy.  You notice blood on the toilet paper or in your stool after you have a bowel movement.  You have unexplained weight loss.  You  have rectum (rectal) pain.  You have stool leakage.  You experience nausea or vomiting. Get help right away if:  You have rectal bleeding or you pass blood clots.  You have severe rectal pain.  You have body tissue that pushes out (protrudes) from your anus.  You have severe pain or bloating (distension) in your abdomen.  You have vomiting that you cannot control. Summary  Chronic constipation is a condition in which a person has three or fewer bowel movements a week, for three months or longer.  You may have a higher risk for this condition if you are an older adult, or if you do not drink enough water or get enough physical activity (are sedentary).  Treatment for this condition depends on the cause. Most treatments for chronic constipation include adding fiber to  your diet, drinking more fluids, and getting more physical activity. You may also need to treat any underlying medical conditions or stop or change certain medicines if they cause constipation.  If lifestyle changes do not relieve constipation, your health care provider may recommend taking a laxative. This information is not intended to replace advice given to you by your health care provider. Make sure you discuss any questions you have with your health care provider. Document Revised: 02/03/2017 Document Reviewed: 11/08/2016 Elsevier Patient Education  [place].

## 2019-05-09 NOTE — Patient Instructions (Signed)
Description   Hold today's dose then continue same dose 1.5 tablets (7.5mg ) daily. Call us with any new medications scheduled for any procedures or any questions.  Recheck in 6 weeks. Phone [phone] [facility]  Main # [phone] fax [phone]

## 2019-05-25 ENCOUNTER — Other Ambulatory Visit: Payer: Self-pay | Admitting: Family Medicine

## 2019-05-27 ENCOUNTER — Telehealth: Payer: Self-pay | Admitting: Family Medicine

## 2019-05-27 NOTE — Telephone Encounter (Signed)
Rx sent to the pharmacy as requested. ?

## 2019-05-27 NOTE — Telephone Encounter (Signed)
Pt call and stated that the  Pharmacy sent two message over for a refill on meloxicam (MOBIC) 15 MG tablet  and haven't receive anything pt said he need this done.

## 2019-05-28 MED ORDER — MELOXICAM 15 MG PO TABS: 15.0000 mg | ORAL_TABLET | Freq: Every day | ORAL | 3 refills | Status: DC

## 2019-05-28 NOTE — Telephone Encounter (Signed)
Done

## 2019-05-28 NOTE — Addendum Note (Signed)
Addended by: Alysia Penna A on: 05/28/2019 12:56 PM   Modules accepted: Orders

## 2019-06-19 ENCOUNTER — Ambulatory Visit (INDEPENDENT_AMBULATORY_CARE_PROVIDER_SITE_OTHER): Payer: PPO

## 2019-06-19 ENCOUNTER — Other Ambulatory Visit: Payer: Self-pay

## 2019-06-19 ENCOUNTER — Telehealth: Payer: Self-pay | Admitting: Family Medicine

## 2019-06-19 DIAGNOSIS — I4891 Unspecified atrial fibrillation: Secondary | ICD-10-CM

## 2019-06-19 DIAGNOSIS — Z5181 Encounter for therapeutic drug level monitoring: Secondary | ICD-10-CM | POA: Diagnosis not present

## 2019-06-19 LAB — POCT INR: INR: 2.8 (ref 2.0–3.0)

## 2019-06-19 NOTE — Telephone Encounter (Signed)
Pt received a letter for [employer] duty and he is wondering if Sarajane Jews can write [facility] a letter documenting his medical condition. He also stated Dr. Sarajane Jews has done this before but apparently [facility] needs another one. He would also like to know if there is a way to send that letter to him other than coming to he office such as my-chart or mailing it?    Pt can be reached at [phone]

## 2019-06-19 NOTE — Patient Instructions (Signed)
Description   Continue same dose 1.5 tablets (7.5mg) daily. Call us with any new medications scheduled for any procedures or any questions.  Recheck in 8 weeks. Phone [phone] [facility]  Main # [phone] fax [phone]    

## 2019-06-20 NOTE — Telephone Encounter (Signed)
I wrote a letter in My Chart so he can download this at home

## 2019-07-22 ENCOUNTER — Other Ambulatory Visit: Payer: Self-pay

## 2019-07-23 ENCOUNTER — Encounter: Payer: Self-pay | Admitting: Family Medicine

## 2019-07-23 ENCOUNTER — Ambulatory Visit (INDEPENDENT_AMBULATORY_CARE_PROVIDER_SITE_OTHER): Payer: PPO | Admitting: Family Medicine

## 2019-07-23 VITALS — BP 134/80 | HR 72 | Temp 98.2°F | Wt 279.2 lb

## 2019-07-23 DIAGNOSIS — L02229 Furuncle of trunk, unspecified: Secondary | ICD-10-CM

## 2019-07-23 MED ORDER — DOXYCYCLINE HYCLATE 100 MG PO TABS: 100.0000 mg | ORAL_TABLET | Freq: Two times a day (BID) | ORAL | 0 refills | Status: DC

## 2019-07-23 NOTE — Progress Notes (Signed)
   Subjective:    Patient ID: [name], male    DOB: 09-04-1950, 69 y.o.   MRN: [id number]  HPI Here for a tender boil on the back. He became aware of this about 4 weeks ago but over the past 2 days it has gotten worse. No fevers.    Review of Systems  Constitutional: Negative.   Respiratory: Negative.   Cardiovascular: Negative.        Objective:   Physical Exam Constitutional:      Appearance: He is not ill-appearing.     Comments: Using a walker   Cardiovascular:     Rate and Rhythm: Normal rate and regular rhythm.     Pulses: Normal pulses.     Heart sounds: Normal heart sounds.  Pulmonary:     Effort: Pulmonary effort is normal.     Breath sounds: Normal breath sounds.  Skin:    Comments: The right upper back has a 3 cm tender boil with some purulent material draining out of several small openings.   Neurological:     Mental Status: He is alert.           Assessment & Plan:  Boil, this was lanced and a large amount of material was removed. He will shower and dress the area daily. Given Doxycycline for 10 days. Recheck prn.  Alysia Penna, MD

## 2019-07-24 ENCOUNTER — Other Ambulatory Visit: Payer: Self-pay | Admitting: Family Medicine

## 2019-08-14 ENCOUNTER — Ambulatory Visit (INDEPENDENT_AMBULATORY_CARE_PROVIDER_SITE_OTHER): Payer: PPO | Admitting: *Deleted

## 2019-08-14 ENCOUNTER — Other Ambulatory Visit: Payer: Self-pay

## 2019-08-14 DIAGNOSIS — I4891 Unspecified atrial fibrillation: Secondary | ICD-10-CM | POA: Diagnosis not present

## 2019-08-14 DIAGNOSIS — Z5181 Encounter for therapeutic drug level monitoring: Secondary | ICD-10-CM

## 2019-08-14 LAB — POCT INR: INR: 2.9 (ref 2.0–3.0)

## 2019-08-14 NOTE — Patient Instructions (Signed)
Description   Continue same dose 1.5 tablets (7.5mg ) daily. Call us with any new medications scheduled for any procedures or any questions.  Recheck in 8 weeks. Phone [phone] [facility]  Main # [phone] fax [phone]

## 2019-08-21 ENCOUNTER — Other Ambulatory Visit: Payer: Self-pay

## 2019-08-21 MED ORDER — WARFARIN SODIUM 5 MG PO TABS: ORAL_TABLET | ORAL | 2 refills | Status: DC

## 2019-08-23 ENCOUNTER — Other Ambulatory Visit: Payer: Self-pay | Admitting: Family Medicine

## 2019-10-09 ENCOUNTER — Other Ambulatory Visit: Payer: Self-pay

## 2019-10-09 ENCOUNTER — Ambulatory Visit (INDEPENDENT_AMBULATORY_CARE_PROVIDER_SITE_OTHER): Payer: PPO | Admitting: Pharmacist

## 2019-10-09 DIAGNOSIS — Z5181 Encounter for therapeutic drug level monitoring: Secondary | ICD-10-CM

## 2019-10-09 DIAGNOSIS — I4891 Unspecified atrial fibrillation: Secondary | ICD-10-CM

## 2019-10-09 LAB — POCT INR: INR: 2.6 (ref 2.0–3.0)

## 2019-10-09 NOTE — Patient Instructions (Signed)
Continue same dose 1.5 tablets (7.5mg ) daily. Call us with any new medications scheduled for any procedures or any questions.  Recheck in 8 weeks. Phone [phone] [facility]  Main # [phone] fax [phone]

## 2019-11-16 DIAGNOSIS — M79672 Pain in left foot: Secondary | ICD-10-CM | POA: Diagnosis not present

## 2019-11-20 ENCOUNTER — Other Ambulatory Visit: Payer: Self-pay | Admitting: Internal Medicine

## 2019-12-04 ENCOUNTER — Other Ambulatory Visit: Payer: Self-pay

## 2019-12-04 ENCOUNTER — Ambulatory Visit (INDEPENDENT_AMBULATORY_CARE_PROVIDER_SITE_OTHER): Payer: PPO | Admitting: *Deleted

## 2019-12-04 DIAGNOSIS — Z5181 Encounter for therapeutic drug level monitoring: Secondary | ICD-10-CM | POA: Diagnosis not present

## 2019-12-04 DIAGNOSIS — I4891 Unspecified atrial fibrillation: Secondary | ICD-10-CM | POA: Diagnosis not present

## 2019-12-04 LAB — POCT INR: INR: 3 (ref 2.0–3.0)

## 2019-12-04 NOTE — Patient Instructions (Signed)
Description   Continue taking Warfarin 1.5 tablets (7.5mg) daily. Call us with any new medications scheduled for any procedures or any questions. Recheck in 8 weeks. Phone [phone] [facility] Main # [phone] fax [phone]     

## 2020-01-20 ENCOUNTER — Other Ambulatory Visit: Payer: Self-pay | Admitting: Internal Medicine

## 2020-01-28 ENCOUNTER — Other Ambulatory Visit: Payer: Self-pay

## 2020-01-28 ENCOUNTER — Ambulatory Visit (INDEPENDENT_AMBULATORY_CARE_PROVIDER_SITE_OTHER): Payer: PPO

## 2020-01-28 DIAGNOSIS — Z5181 Encounter for therapeutic drug level monitoring: Secondary | ICD-10-CM

## 2020-01-28 DIAGNOSIS — I4891 Unspecified atrial fibrillation: Secondary | ICD-10-CM

## 2020-01-28 LAB — POCT INR: INR: 3 (ref 2.0–3.0)

## 2020-01-28 NOTE — Patient Instructions (Signed)
Description   Continue taking Warfarin 1.5 tablets (7.5mg) daily. Call us with any new medications scheduled for any procedures or any questions. Recheck in 8 weeks. Phone [phone] [facility] Main # [phone] fax [phone]     

## 2020-02-21 ENCOUNTER — Other Ambulatory Visit: Payer: Self-pay | Admitting: Internal Medicine

## 2020-03-17 ENCOUNTER — Telehealth: Payer: Self-pay | Admitting: Family Medicine

## 2020-03-17 NOTE — Telephone Encounter (Signed)
Left message for patient to call back and schedule Medicare Annual Wellness Visit (AWV) either virtually or in office.   Last AWV no information please schedule at anytime with [facility] Nurse Health Advisor 1 or 2   This should be a 45 minute visit. 

## 2020-04-01 ENCOUNTER — Other Ambulatory Visit: Payer: Self-pay

## 2020-04-01 ENCOUNTER — Ambulatory Visit (INDEPENDENT_AMBULATORY_CARE_PROVIDER_SITE_OTHER): Payer: PPO | Admitting: *Deleted

## 2020-04-01 DIAGNOSIS — I4891 Unspecified atrial fibrillation: Secondary | ICD-10-CM | POA: Diagnosis not present

## 2020-04-01 DIAGNOSIS — Z5181 Encounter for therapeutic drug level monitoring: Secondary | ICD-10-CM

## 2020-04-01 LAB — POCT INR: INR: 2.7 (ref 2.0–3.0)

## 2020-04-01 NOTE — Patient Instructions (Signed)
Description   Continue taking Warfarin 1.5 tablets (7.5mg) daily. Call us with any new medications scheduled for any procedures or any questions. Recheck in 8 weeks. Phone [phone] [facility] Main # [phone] fax [phone]     

## 2020-04-19 ENCOUNTER — Other Ambulatory Visit: Payer: Self-pay | Admitting: Internal Medicine

## 2020-04-21 ENCOUNTER — Other Ambulatory Visit: Payer: Self-pay | Admitting: Family Medicine

## 2020-05-07 ENCOUNTER — Telehealth: Payer: Self-pay | Admitting: Pharmacist

## 2020-05-07 NOTE — Chronic Care Management (AMB) (Signed)
I left the patient a message about his upcoming appointment on 05/08/2020 @ 1:00 pm with the clinical pharmacist. He was asked to please have all medication on hand to review with the pharmacist.  Neita Goodnight) Mare Ferrari, [place] Assistant [phone]

## 2020-05-08 ENCOUNTER — Ambulatory Visit (INDEPENDENT_AMBULATORY_CARE_PROVIDER_SITE_OTHER): Payer: PPO | Admitting: Pharmacist

## 2020-05-08 DIAGNOSIS — I4821 Permanent atrial fibrillation: Secondary | ICD-10-CM | POA: Diagnosis not present

## 2020-05-08 DIAGNOSIS — E785 Hyperlipidemia, unspecified: Secondary | ICD-10-CM

## 2020-05-08 DIAGNOSIS — I10 Essential (primary) hypertension: Secondary | ICD-10-CM

## 2020-05-08 NOTE — Progress Notes (Signed)
Chronic Care Management Pharmacy Note  05/18/2020 Name:  [name] MRN:  [id number] DOB:  1951-01-06  Subjective: [name] is an 70 y.o. year old male who is a primary patient of [name], MD.  The CCM team was consulted for assistance with disease management and care coordination needs.    Engaged with patient by telephone for follow up visit in response to provider referral for pharmacy case management and/or care coordination services.   Consent to Services:  The patient was given information about Chronic Care Management services, agreed to services, and gave verbal consent prior to initiation of services.  Please see initial visit note for detailed documentation.   Patient Care Team: [name], MD as PCP - Desma Paganini, MD as PCP - Cardiology (Cardiology) Viona Gilmore, [facility] [address] as Pharmacist (Pharmacist)  Recent office visits: 07/23/19 Alysia Penna, MD: Patient presented for cyst. Prescribed doxycycline 100 mg BID.  Recent consult visits: 04/01/20 Derrel Nip, RN (cardiology): Patient presented for anti-coag visit. INR 2.7, goal 2-3. Continue 7.5 mg every day.  11/16/19 Alvin Leu: Patient presented for pain in left foot. Unable to access notes.  Hospital visits: None in previous 6 months  Objective:  Lab Results  Component Value Date   CREATININE 1.18 04/17/2019   BUN 30 (H) 04/17/2019   GFR 73.15 07/20/2017   GFRNONAA 63 04/17/2019   GFRAA 72 04/17/2019   NA 140 04/17/2019   K 4.2 04/17/2019   CALCIUM 11.4 (H) 04/17/2019   CO2 21 04/17/2019    Lab Results  Component Value Date/Time   HGBA1C 5.8 07/20/2017 08:58 AM   HGBA1C 5.8 07/18/2016 09:22 AM   GFR 73.15 07/20/2017 08:58 AM   GFR 82.17 07/18/2016 09:22 AM    Last diabetic Eye exam: No results found for: HMDIABEYEEXA  Last diabetic Foot exam: No results found for: HMDIABFOOTEX   Lab Results  Component Value Date   CHOL 163 03/29/2019   HDL 40 03/29/2019   LDLCALC 103  (H) 03/29/2019   LDLDIRECT 168.0 03/25/2014   TRIG 110 03/29/2019   CHOLHDL 4.1 03/29/2019    Hepatic Function Latest Ref Rng & Units 07/20/2017 07/18/2016 07/10/2015  Total Protein 6.0 - 8.3 g/dL 6.9 6.8 6.7  Albumin 3.5 - 5.2 g/dL 4.2 4.3 4.1  AST 0 - 37 U/L '16 17 22  ' ALT 0 - 53 U/L '19 20 24  ' Alk Phosphatase 39 - 117 U/L 95 104 100  Total Bilirubin 0.2 - 1.2 mg/dL 0.7 0.6 0.5  Bilirubin, Direct 0.0 - 0.3 mg/dL 0.2 0.1 0.1    Lab Results  Component Value Date/Time   TSH 1.560 03/29/2019 04:55 PM   TSH 3.50 07/20/2017 08:58 AM    CBC Latest Ref Rng & Units 03/29/2019 07/20/2017 07/18/2016  WBC 3.4 - 10.8 x10E3/uL 11.6(H) 8.9 8.6  Hemoglobin 13.0 - 17.7 g/dL 13.7 14.2 14.3  Hematocrit 37.5 - 51.0 % 40.7 40.4 41.0  Platelets 150 - 450 x10E3/uL 237 200.0 213.0    Lab Results  Component Value Date/Time   VD25OH 20.47 (L) 10/14/2013 01:56 PM   VD25OH <10 (L) 07/18/2013 11:58 AM    Clinical ASCVD: No  The 10-year ASCVD risk score Mikey Bussing DC Jr., et al., 2013) is: 29.1%   Values used to calculate the score:     Age: 44 years     Sex: Male     Is Non-Hispanic African American: No     Diabetic: No  Tobacco smoker: No     Systolic Blood Pressure: 701 mmHg     Is BP treated: Yes     HDL Cholesterol: 40 mg/dL     Total Cholesterol: 163 mg/dL    Depression screen [facility] 2/9 07/21/2016  Decreased Interest 0  Down, Depressed, Hopeless 0  PHQ - 2 Score 0  Some recent data might be hidden     CHA2DS2/VAS Stroke Risk Points  Current as of about an hour ago     2 >= 2 Points: High Risk  1 - 1.99 Points: Medium Risk  0 Points: Low Risk    Last Change: N/A      Details    This score determines the patient's risk of having a stroke if the  patient has atrial fibrillation.       Points Metrics  0 Has Congestive Heart Failure:  No    Current as of about an hour ago  0 Has Vascular Disease:  No    Current as of about an hour ago  1 Has Hypertension:  Yes    Current as of about an  hour ago  1 Age:  26    Current as of about an hour ago  0 Has Diabetes:  No    Current as of about an hour ago  0 Had Stroke:  No  Had TIA:  No  Had Thromboembolism:  No    Current as of about an hour ago  0 Male:  No    Current as of about an hour ago     Social History   Tobacco Use  Smoking Status Never Smoker  Smokeless Tobacco Never Used   BP Readings from Last 3 Encounters:  07/23/19 134/80  03/29/19 128/78  07/25/17 130/90   Pulse Readings from Last 3 Encounters:  07/23/19 72  03/29/19 86  07/25/17 79   Wt Readings from Last 3 Encounters:  07/23/19 279 lb 3.2 oz (126.6 kg)  03/29/19 280 lb (127 kg)  07/25/17 283 lb (128.4 kg)    Assessment/Interventions: Review of patient past medical history, allergies, medications, health status, including review of consultants reports, laboratory and other test data, was performed as part of comprehensive evaluation and provision of chronic care management services.   SDOH:  (Social Determinants of Health) assessments and interventions performed: Yes SDOH Interventions   Flowsheet Row Most Recent Value  SDOH Interventions   Transportation Interventions Intervention Not Indicated      CCM Care Plan  Allergies  Allergen Reactions  . Statins   . Penicillins     REACTION: unspecified    Medications Reviewed Today    Reviewed by [name], MD (Physician) on 07/23/19 at 1506  Med List Status: <None>  Medication Order Taking? Sig Documenting Provider Last Dose Status Informant  amLODipine (NORVASC) 10 MG tablet [id number] Yes Take 1 tablet (10 mg total) by mouth daily. [name], MD Taking Active   carvedilol (COREG) 25 MG tablet [id number] Yes Take 1 tablet (25 mg total) by mouth 2 (two) times daily. [name], MD Taking Active   cloNIDine (CATAPRES) 0.2 MG tablet [id number] Yes Take 1 tablet (0.2 mg total) by mouth 2 (two) times daily. [name], MD Taking Active   doxazosin (CARDURA) 4 MG tablet  [id number] Yes Take 1 tablet (4 mg total) by mouth daily. [name], MD Taking Active   ezetimibe (ZETIA) 10 MG tablet [id number] Yes Take 1 tablet (10 mg total) by  mouth daily. [name], MD Taking Active   fish oil-omega-3 fatty acids 1000 MG capsule [id number] Yes Take 1 g by mouth daily.   [provider] Taking Active Self  hydrochlorothiazide (MICROZIDE) 12.5 MG capsule [id number]  Take 1 capsule (12.5 mg total) by mouth daily. Fay Records, MD  Active   losartan (COZAAR) 100 MG tablet [id number] Yes Take 1 tablet (100 mg total) by mouth daily. [name], MD Taking Active   lubiprostone [facility]) 24 MCG capsule [id number] Yes Take 1 capsule (24 mcg total) by mouth 2 (two) times daily. [name], MD Taking Active   meloxicam [facility]) 15 MG tablet [id number] Yes Take 1 tablet (15 mg total) by mouth daily. [name], MD Taking Active   VITAMIN D, CHOLECALCIFEROL, PO [id number] Yes Take 5,000 Units by mouth daily. [provider] Taking Active   warfarin (COUMADIN) 5 MG tablet [id number] Yes Take as directed by the coumadin clinic Fay Records, MD Taking Active           Patient Active Problem List   Diagnosis Date Noted  . Prediabetes 07/21/2016  . Myalgia and myositis 10/17/2013  . Right hip pain 10/17/2013  . Unspecified vitamin D deficiency 10/11/2013  . Encounter for therapeutic drug monitoring 06/03/2013  . OTHER CONSTIPATION 01/20/2010  . PERSONAL HX COLONIC POLYPS 01/20/2010  . OBSTRUCTIVE SLEEP APNEA 11/17/2008  . AORTIC STENOSIS/ INSUFFICIENCY, NON-RHEUMATIC 08/20/2008  . ATRIAL FIBRILLATION 08/18/2008  . HYPOKALEMIA, HX OF 08/18/2008  . PLANTAR FASCIITIS 08/10/2007  . Dyslipidemia 06/18/2007  . OBESITY 06/18/2007  . INTERNAL HEMORRHOIDS 06/18/2007  . EXTERNAL HEMORRHOIDS 06/18/2007  . Essential hypertension 09/04/2006  . MITRAL REGURGITATION, MODERATE 09/04/2006  . RENAL CALCULUS, HX OF 09/04/2006  . ADENOMATOUS COLONIC POLYP  07/29/2003    There is no immunization history for the selected administration types on file for this patient.  Conditions to be addressed/monitored:  Hypertension, Hyperlipidemia, Diabetes, Atrial Fibrillation and constipation, vitamin D deficiency, right hip pain  Care Plan : [place]  Updates made by Viona Gilmore, [place] since 05/18/2020 12:00 AM    Problem: Problem: Hypertension, Hyperlipidemia, Diabetes, Atrial Fibrillation and constipation, vitamin D deficiency, right hip pain     Long-Range Goal: Patient-Specific Goal   Start Date: 05/08/2020  Expected End Date: 05/08/2021  This Visit's Progress: On track  Priority: High  Note:   Current Barriers:  . Unable to maintain control of cholesterol  Pharmacist Clinical Goal(s):  . patient will achieve control of cholesterol as evidenced by next lipid panel  through collaboration with PharmD and provider.   Interventions: . 1:1 collaboration with [name], MD regarding development and update of comprehensive plan of care as evidenced by provider attestation and co-signature . Inter-disciplinary care team collaboration (see longitudinal plan of care) . Comprehensive medication review performed; medication list updated in electronic medical record  Hypertension (BP goal <140/90) -Controlled -Current treatment:  amlodipine 30m, 1 tablet daily   carvedilol 214m 1 tablet twice daily    clonidine 0.42m14m1 tablet twice daily   doxazosin 4mg36m tablet once daily  HCTZ 12.5mg,49mcapsule daily   losartan 100mg,33mablet daily  -Medications previously tried: none  -Current home readings: does not check at home -Current dietary habits: patient mentions he has reduced meat intake and eats plant based chicken, patties; he is working on reducing his sodium -Current exercise habits: exercise is limited due to bad right hip and bad left knee  and is using a walker -Denies hypotensive/hypertensive symptoms -Educated  on Exercise goal of 150 minutes per week; Importance of home blood pressure monitoring; -Counseled to monitor BP at home weekly, document, and provide log at future appointments -Recommended to continue current medication  Hyperlipidemia: (LDL goal < 100) -Uncontrolled -Current treatment:  ezetimibe 39m, 1 tablet daily  omega-3 fatty acids 10048m 1 capsule once daily  -Medications previously tried:  atorvastatin, simvastatin (myalgias)  -Current dietary patterns: did not discuss -Current exercise habits: limited due to pain -Educated on Cholesterol goals;  Importance of limiting foods high in cholesterol; Exercise goal of 150 minutes per week; -Counseled on diet and exercise extensively Recommended to continue current medication Recommended for patient to stop taking fish oil as this can increase LDL  Pre-diabetes (A1c goal <6.5%) -Controlled -Current medications: . No medications -Medications previously tried: none  -Current home glucose readings . fasting glucose: does not check . post prandial glucose: does not check -Denies hypoglycemic/hyperglycemic symptoms -Current meal patterns:  . breakfast: n/a . lunch: n/a  . dinner: n/a . snacks: n/a . drinks: n/a -Current exercise: n/a -Educated on Exercise goal of 150 minutes per week; Carbohydrate counting and/or plate method -Counseled to check feet daily and get yearly eye exams -Counseled on diet and exercise extensively Recommended repeat A1c check  Atrial Fibrillation (Goal: prevent stroke and major bleeding) -Controlled -CHADSVASC: 2 -Current treatment: . Rate control: carvedilol 2511m1 tablet twice daily . Anticoagulation: warfarin 7.5 mg once daily -Medications previously tried: none -Home BP and HR readings: does not check  -Counseled on bleeding risk associated with warfarin and importance of self-monitoring for signs/symptoms of bleeding; importance of regular laboratory monitoring; -Recommended to  continue current medication Patient has some salads and broccoli but keeps green intake consistent  Vitamin D deficiency (Goal: vitamin D 30-100) -Not ideally controlled -Current treatment  . vitamin D3, 5000 units once daily  -Medications previously tried: none  -Counseled on taking vitamin D with food Recommended repeat vitamin D level  Constipation (Goal: minimize symptoms and regulate bowel movements) -Controlled -Current treatment  . Amitiza 36m41m1 capsule twice daily -Medications previously tried:  biscodyl, docusate, other medications -Recommended to continue current medication Recommended for patient to add more fiber to his diet  Hip pain (Goal: minimize pain) -Controlled -Current treatment  . meloxicam 15mg76mtablet daily  -Medications previously tried: naproxen, hydrocodone/ APAP -Counseled on bleed risk with NSAIDs and warfarin   Health Maintenance -Vaccine gaps: shingles, tetanus, influenza, pneumonia -Current therapy:  . None -Educated on Cost vs benefit of each product must be carefully weighed by individual consumer -Patient is satisfied with current therapy and denies issues - Continue as is  Patient Goals/Self-Care Activities . patient will:  - take medications as prescribed check blood pressure weekly, document, and provide at future appointments  Follow Up Plan: Telephone follow up appointment with care management team member scheduled for:       Medication Assistance: None required.  Patient affirms current coverage meets needs.  Patient's preferred pharmacy is:  [facility] [id number]: [phone] pill box? No - separates pill bottles on bathroom counter based on dosing times. (morning vs BID vs PM).  Pt endorses 100% compliance  We discussed: Benefits of medication synchronization, packaging and delivery as well  as enhanced pharmacist oversight with [employer]. Patient decided to: Continue current medication management strategy  Care Plan and Follow Up Patient Decision:  Patient agrees to Care Plan and Follow-up.  Plan: Telephone follow up appointment with care management team member scheduled for:  6 months  Jeni Salles, PharmD [facility] [place] [phone]  6 months - afternoon guy: 1pm any day except Wednesday

## 2020-05-18 NOTE — Patient Instructions (Addendum)
Hi [name],  It was great to get to meet you over the telephone! Below is a summary of some of the topics we discussed. Please look into getting a blood pressure cuff so you can check this at home.  Please reach out to me if you have any questions or need anything before our follow up!  Best, Maddie  Jeni Salles, PharmD, [place] [facility] [place]  Visit Information  Goals Addressed   None    Patient Care Plan: CCM Pharmacy Care Plan    Problem Identified: Problem: Hypertension, Hyperlipidemia, Diabetes, Atrial Fibrillation and constipation, vitamin D deficiency, right hip pain     Long-Range Goal: Patient-Specific Goal   Start Date: 05/08/2020  Expected End Date: 05/08/2021  This Visit's Progress: On track  Priority: High  Note:   Current Barriers:  . Unable to maintain control of cholesterol  Pharmacist Clinical Goal(s):  . patient will achieve control of cholesterol as evidenced by next lipid panel  through collaboration with PharmD and provider.   Interventions: . 1:1 collaboration with [name], MD regarding development and update of comprehensive plan of care as evidenced by provider attestation and co-signature . Inter-disciplinary care team collaboration (see longitudinal plan of care) . Comprehensive medication review performed; medication list updated in electronic medical record  Hypertension (BP goal <140/90) -Controlled -Current treatment:  amlodipine 10mg , 1 tablet daily   carvedilol 25mg , 1 tablet twice daily    clonidine 0.2mg , 1 tablet twice daily   doxazosin 4mg , 1 tablet once daily  HCTZ 12.5mg , 1 capsule daily   losartan 100mg , 1 tablet daily  -Medications previously tried: none  -Current home readings: does not check at home -Current dietary habits: patient mentions he has reduced meat intake and eats plant based chicken, patties; he is working on reducing his sodium -Current exercise  habits: exercise is limited due to bad right hip and bad left knee and is using a walker -Denies hypotensive/hypertensive symptoms -Educated on Exercise goal of 150 minutes per week; Importance of home blood pressure monitoring; -Counseled to monitor BP at home weekly, document, and provide log at future appointments -Recommended to continue current medication  Hyperlipidemia: (LDL goal < 100) -Uncontrolled -Current treatment:  ezetimibe 10mg , 1 tablet daily  omega-3 fatty acids 1000mg , 1 capsule once daily  -Medications previously tried:  atorvastatin, simvastatin (myalgias)  -Current dietary patterns: did not discuss -Current exercise habits: limited due to pain -Educated on Cholesterol goals;  Importance of limiting foods high in cholesterol; Exercise goal of 150 minutes per week; -Counseled on diet and exercise extensively Recommended to continue current medication Recommended for patient to stop taking fish oil as this can increase LDL  Pre-diabetes (A1c goal <6.5%) -Controlled -Current medications: . No medications -Medications previously tried: none  -Current home glucose readings . fasting glucose: does not check . post prandial glucose: does not check -Denies hypoglycemic/hyperglycemic symptoms -Current meal patterns:  . breakfast: n/a . lunch: n/a  . dinner: n/a . snacks: n/a . drinks: n/a -Current exercise: n/a -Educated on Exercise goal of 150 minutes per week; Carbohydrate counting and/or plate method -Counseled to check feet daily and get yearly eye exams -Counseled on diet and exercise extensively Recommended repeat A1c check  Atrial Fibrillation (Goal: prevent stroke and major bleeding) -Controlled -CHADSVASC: 2 -Current treatment: . Rate control: carvedilol 25mg , 1 tablet twice daily . Anticoagulation: warfarin 7.5 mg once daily -Medications previously tried: none -Home BP and HR readings: does not check  -Counseled on  bleeding risk associated  with warfarin and importance of self-monitoring for signs/symptoms of bleeding; importance of regular laboratory monitoring; -Recommended to continue current medication Patient has some salads and broccoli but keeps green intake consistent  Vitamin D deficiency (Goal: vitamin D 30-100) -Not ideally controlled -Current treatment  . vitamin D3, 5000 units once daily  -Medications previously tried: none  -Counseled on taking vitamin D with food Recommended repeat vitamin D level  Constipation (Goal: minimize symptoms and regulate bowel movements) -Controlled -Current treatment  . Amitiza 101mcg, 1 capsule twice daily -Medications previously tried:  biscodyl, docusate, other medications -Recommended to continue current medication Recommended for patient to add more fiber to his diet  Hip pain (Goal: minimize pain) -Controlled -Current treatment  . meloxicam 15mg , 1 tablet daily  -Medications previously tried: naproxen, hydrocodone/ APAP -Counseled on bleed risk with NSAIDs and warfarin   Health Maintenance -Vaccine gaps: shingles, tetanus, influenza, pneumonia -Current therapy:  . None -Educated on Cost vs benefit of each product must be carefully weighed by individual consumer -Patient is satisfied with current therapy and denies issues - Continue as is  Patient Goals/Self-Care Activities . patient will:  - take medications as prescribed check blood pressure weekly, document, and provide at future appointments  Follow Up Plan: Telephone follow up appointment with care management team member scheduled for:       Patient verbalizes understanding of instructions provided today and agrees to view in [place].  Telephone follow up appointment with pharmacy team member scheduled for: 6 months  Viona Gilmore, [facility]  How to Take Your Blood Pressure Blood pressure measures how strongly your blood is pressing against the walls of your arteries. Arteries are blood vessels that  carry blood from your heart throughout your body. You can take your blood pressure at home with a machine. You may need to check your blood pressure at home:  To check if you have high blood pressure (hypertension).  To check your blood pressure over time.  To make sure your blood pressure medicine is working. Supplies needed:  Blood pressure machine, or monitor.  Dining room chair to sit in.  Table or desk.  Small notebook.  Pencil or pen. How to prepare Avoid these things for 30 minutes before checking your blood pressure:  Having drinks with caffeine in them, such as coffee or tea.  Drinking alcohol.  Eating.  Smoking.  Exercising. Do these things five minutes before checking your blood pressure:  Go to the bathroom and pee (urinate).  Sit in a dining chair. Do not sit in a soft couch or an armchair.  Be quiet. Do not talk. How to take your blood pressure Follow the instructions that came with your machine. If you have a digital blood pressure monitor, these may be the instructions: 1. Sit up straight. 2. Place your feet on the floor. Do not cross your ankles or legs. 3. Rest your left arm at the level of your heart. You may rest it on a table, desk, or chair. 4. Pull up your shirt sleeve. 5. Wrap the blood pressure cuff around the upper part of your left arm. The cuff should be 1 inch (2.5 cm) above your elbow. It is best to wrap the cuff around bare skin. 6. Fit the cuff snugly around your arm. You should be able to place only one finger between the cuff and your arm. 7. Place the cord so that it rests in the bend of your elbow. 8. Press the power  button. 9. Sit quietly while the cuff fills with air and loses air. 10. Write down the numbers on the screen. 11. Wait 2-3 minutes and then repeat steps 1-10.   What do the numbers mean? Two numbers make up your blood pressure. The first number is called systolic pressure. The second is called diastolic pressure. An  example of a blood pressure reading is "120 over 80" (or 120/80). If you are an adult and do not have a medical condition, use this guide to find out if your blood pressure is normal: Normal  First number: below 120.  Second number: below 80. Elevated  First number: 120-129.  Second number: below 80. Hypertension stage 1  First number: 130-139.  Second number: 80-89. Hypertension stage 2  First number: 140 or above.  Second number: 53 or above. Your blood pressure is above normal even if only the top or bottom number is above normal. Follow these instructions at home:  Check your blood pressure as often as your doctor tells you to.  Check your blood pressure at the same time every day.  Take your monitor to your next doctor's appointment. Your doctor will: ? Make sure you are using it correctly. ? Make sure it is working right.  Make sure you understand what your blood pressure numbers should be.  Tell your doctor if your medicine is causing side effects.  Keep all follow-up visits as told by your doctor. This is important. General tips:  You will need a blood pressure machine, or monitor. Your doctor can suggest a monitor. You can buy one at a drugstore or online. When choosing one: ? Choose one with an arm cuff. ? Choose one that wraps around your upper arm. Only one finger should fit between your arm and the cuff. ? Do not choose one that measures your blood pressure from your wrist or finger. Where to find more information American Heart Association: www.heart.org Contact a doctor if:  Your blood pressure keeps being high. Get help right away if:  Your first blood pressure number is higher than 180.  Your second blood pressure number is higher than 120. Summary  Check your blood pressure at the same time every day.  Avoid caffeine, alcohol, smoking, and exercise for 30 minutes before checking your blood pressure.  Make sure you understand what your  blood pressure numbers should be. This information is not intended to replace advice given to you by your health care provider. Make sure you discuss any questions you have with your health care provider. Document Revised: 02/15/2019 Document Reviewed: 02/15/2019 Elsevier Patient Education  2021 [employer].

## 2020-05-27 ENCOUNTER — Other Ambulatory Visit: Payer: Self-pay

## 2020-05-27 ENCOUNTER — Ambulatory Visit (INDEPENDENT_AMBULATORY_CARE_PROVIDER_SITE_OTHER): Payer: PPO

## 2020-05-27 DIAGNOSIS — Z5181 Encounter for therapeutic drug level monitoring: Secondary | ICD-10-CM | POA: Diagnosis not present

## 2020-05-27 DIAGNOSIS — I4891 Unspecified atrial fibrillation: Secondary | ICD-10-CM

## 2020-05-27 LAB — POCT INR: INR: 2.5 (ref 2.0–3.0)

## 2020-05-27 NOTE — Patient Instructions (Signed)
Description   Continue taking Warfarin 1.5 tablets (7.5mg) daily. Call us with any new medications scheduled for any procedures or any questions. Recheck in 8 weeks. Phone [phone] [facility] Main # [phone] fax [phone]     

## 2020-06-10 ENCOUNTER — Ambulatory Visit: Payer: PPO | Admitting: Physician Assistant

## 2020-06-17 ENCOUNTER — Other Ambulatory Visit: Payer: Self-pay | Admitting: Family Medicine

## 2020-06-29 ENCOUNTER — Other Ambulatory Visit: Payer: Self-pay

## 2020-06-29 ENCOUNTER — Encounter: Payer: Self-pay | Admitting: Family Medicine

## 2020-06-29 ENCOUNTER — Ambulatory Visit (INDEPENDENT_AMBULATORY_CARE_PROVIDER_SITE_OTHER): Payer: PPO | Admitting: Family Medicine

## 2020-06-29 VITALS — BP 140/78 | HR 66 | Temp 98.1°F | Ht 70.0 in | Wt 271.0 lb

## 2020-06-29 DIAGNOSIS — Z0000 Encounter for general adult medical examination without abnormal findings: Secondary | ICD-10-CM

## 2020-06-29 LAB — URINALYSIS, ROUTINE W REFLEX MICROSCOPIC
Ketones, ur: NEGATIVE
Leukocytes,Ua: NEGATIVE
Nitrite: NEGATIVE
Specific Gravity, Urine: 1.025 (ref 1.000–1.030)
Total Protein, Urine: 100 — AB
Urine Glucose: NEGATIVE
Urobilinogen, UA: 0.2 (ref 0.0–1.0)
pH: 5.5 (ref 5.0–8.0)

## 2020-06-29 LAB — LIPID PANEL
Cholesterol: 140 mg/dL (ref 0–200)
HDL: 33 mg/dL — ABNORMAL LOW (ref >39.00)
LDL Cholesterol: 86 mg/dL (ref 0–99)
NonHDL: 106.98
Total CHOL/HDL Ratio: 4
Triglycerides: 104 mg/dL (ref 0.0–149.0)
VLDL: 20.8 mg/dL (ref 0.0–40.0)

## 2020-06-29 LAB — BASIC METABOLIC PANEL
BUN: 28 mg/dL — ABNORMAL HIGH (ref 6–23)
CO2: 28 mEq/L (ref 19–32)
Calcium: 11.7 mg/dL — ABNORMAL HIGH (ref 8.4–10.5)
Chloride: 104 mEq/L (ref 96–112)
Creatinine, Ser: 1 mg/dL (ref 0.40–1.50)
GFR: 76.27 mL/min (ref >60.00)
Glucose, Bld: 106 mg/dL — ABNORMAL HIGH (ref 70–99)
Potassium: 4.5 mEq/L (ref 3.5–5.1)
Sodium: 137 mEq/L (ref 135–145)

## 2020-06-29 LAB — CBC WITH DIFFERENTIAL/PLATELET
Basophils Absolute: 0 10*3/uL (ref 0.0–0.1)
Basophils Relative: 0.4 % (ref 0.0–3.0)
Eosinophils Absolute: 0.1 10*3/uL (ref 0.0–0.7)
Eosinophils Relative: 1.1 % (ref 0.0–5.0)
HCT: 37.7 % — ABNORMAL LOW (ref 39.0–52.0)
Hemoglobin: 12.9 g/dL — ABNORMAL LOW (ref 13.0–17.0)
Lymphocytes Relative: 18.1 % (ref 12.0–46.0)
Lymphs Abs: 1.3 10*3/uL (ref 0.7–4.0)
MCHC: 34.2 g/dL (ref 30.0–36.0)
MCV: 89.9 fl (ref 78.0–100.0)
Monocytes Absolute: 0.5 10*3/uL (ref 0.1–1.0)
Monocytes Relative: 7 % (ref 3.0–12.0)
Neutro Abs: 5.1 10*3/uL (ref 1.4–7.7)
Neutrophils Relative %: 73.4 % (ref 43.0–77.0)
Platelets: 214 10*3/uL (ref 150.0–400.0)
RBC: 4.2 Mil/uL — ABNORMAL LOW (ref 4.22–5.81)
RDW: 14.5 % (ref 11.5–15.5)
WBC: 6.9 10*3/uL (ref 4.0–10.5)

## 2020-06-29 LAB — TSH: TSH: 1.59 u[IU]/mL (ref 0.35–4.50)

## 2020-06-29 LAB — HEPATIC FUNCTION PANEL
ALT: 19 U/L (ref 0–53)
AST: 17 U/L (ref 0–37)
Albumin: 4.1 g/dL (ref 3.5–5.2)
Alkaline Phosphatase: 108 U/L (ref 39–117)
Bilirubin, Direct: 0.1 mg/dL (ref 0.0–0.3)
Total Bilirubin: 0.7 mg/dL (ref 0.2–1.2)
Total Protein: 6.9 g/dL (ref 6.0–8.3)

## 2020-06-29 LAB — T3, FREE: T3, Free: 3.3 pg/mL (ref 2.3–4.2)

## 2020-06-29 LAB — PSA: PSA: 0.32 ng/mL (ref 0.10–4.00)

## 2020-06-29 LAB — HEMOGLOBIN A1C: Hgb A1c MFr Bld: 5.8 % (ref 4.6–6.5)

## 2020-06-29 LAB — T4, FREE: Free T4: 0.92 ng/dL (ref 0.60–1.60)

## 2020-06-29 MED ORDER — AMLODIPINE BESYLATE 10 MG PO TABS
1.0000 | ORAL_TABLET | Freq: Every day | ORAL | 3 refills | Status: DC
Start: 2020-06-29 — End: 2021-06-22

## 2020-06-29 MED ORDER — MELOXICAM 15 MG PO TABS: 1.0000 | ORAL_TABLET | Freq: Every day | ORAL | 3 refills | Status: DC

## 2020-06-29 MED ORDER — EZETIMIBE 10 MG PO TABS: 10.0000 mg | ORAL_TABLET | Freq: Every day | ORAL | 3 refills | Status: DC

## 2020-06-29 MED ORDER — CARVEDILOL 25 MG PO TABS: 25.0000 mg | ORAL_TABLET | Freq: Two times a day (BID) | ORAL | 3 refills | Status: DC

## 2020-06-29 MED ORDER — DOXAZOSIN MESYLATE 4 MG PO TABS: 4.0000 mg | ORAL_TABLET | Freq: Every day | ORAL | 3 refills | Status: DC

## 2020-06-29 MED ORDER — LUBIPROSTONE 24 MCG PO CAPS: 24.0000 ug | ORAL_CAPSULE | Freq: Two times a day (BID) | ORAL | 3 refills | Status: DC

## 2020-06-29 MED ORDER — HYDROCHLOROTHIAZIDE 12.5 MG PO CAPS: 12.5000 mg | ORAL_CAPSULE | Freq: Every day | ORAL | 3 refills | Status: DC

## 2020-06-29 MED ORDER — CLONIDINE HCL 0.2 MG PO TABS: 0.2000 mg | ORAL_TABLET | Freq: Two times a day (BID) | ORAL | 3 refills | Status: DC

## 2020-06-29 MED ORDER — LOSARTAN POTASSIUM 100 MG PO TABS: 1.0000 | ORAL_TABLET | Freq: Every day | ORAL | 3 refills | Status: DC

## 2020-06-29 MED ORDER — KETOCONAZOLE 2 % EX CREA: 1.0000 "application " | TOPICAL_CREAM | Freq: Two times a day (BID) | CUTANEOUS | 2 refills | Status: DC | PRN

## 2020-06-29 NOTE — Progress Notes (Signed)
Subjective:    Patient ID: [name], male    DOB: [birth year], 70 y.o.   MRN: [id number]  HPI Here for a well exam. He has been doing fairly well. His atrial fibrillation and HTN are stable. He will see Dr. Harrington Challenger next week. His INR checks are within range. He is past due for a colonoscopy.    Review of Systems  Constitutional: Negative.   HENT: Negative.   Eyes: Negative.   Respiratory: Negative.   Cardiovascular: Negative.   Gastrointestinal: Negative.   Genitourinary: Negative.   Musculoskeletal: Negative.   Skin: Negative.   Neurological: Negative.   Psychiatric/Behavioral: Negative.        Objective:   Physical Exam Constitutional:      General: He is not in acute distress.    Appearance: He is well-developed. He is obese. He is not diaphoretic.     Comments: In a wheelchair, but he can stand to pivot  HENT:     Head: Normocephalic and atraumatic.     Right Ear: External ear normal.     Left Ear: External ear normal.     Nose: Nose normal.     Mouth/Throat:     Pharynx: No oropharyngeal exudate.  Eyes:     General: No scleral icterus.       Right eye: No discharge.        Left eye: No discharge.     Conjunctiva/sclera: Conjunctivae normal.     Pupils: Pupils are equal, round, and reactive to light.  Neck:     Thyroid: No thyromegaly.     Vascular: No JVD.     Trachea: No tracheal deviation.  Cardiovascular:     Rate and Rhythm: Normal rate and regular rhythm.     Heart sounds: Normal heart sounds. No murmur heard. No friction rub. No gallop.   Pulmonary:     Effort: Pulmonary effort is normal. No respiratory distress.     Breath sounds: Normal breath sounds. No wheezing or rales.  Chest:     Chest wall: No tenderness.  Abdominal:     General: Bowel sounds are normal. There is no distension.     Palpations: Abdomen is soft. There is no mass.     Tenderness: There is no abdominal tenderness. There is no guarding or rebound.  Genitourinary:    Penis:  Normal. No tenderness.      Testes: Normal.     Prostate: Normal.     Rectum: Normal. Guaiac result negative.  Musculoskeletal:        General: No tenderness. Normal range of motion.     Cervical back: Neck supple.  Lymphadenopathy:     Cervical: No cervical adenopathy.  Skin:    General: Skin is warm and dry.     Coloration: Skin is not pale.     Findings: No erythema or rash.  Neurological:     Mental Status: He is alert and oriented to person, place, and time.     Cranial Nerves: No cranial nerve deficit.     Motor: No abnormal muscle tone.     Coordination: Coordination normal.     Deep Tendon Reflexes: Reflexes are normal and symmetric. Reflexes normal.  Psychiatric:        Behavior: Behavior normal.        Thought Content: Thought content normal.        Judgment: Judgment normal.           Assessment & Plan:  Well exam. We will get fasting labs. We discussed diet and exercise. Set up another colonoscopy.  Alysia Penna, MD

## 2020-07-01 ENCOUNTER — Telehealth: Payer: Self-pay | Admitting: Family Medicine

## 2020-07-01 ENCOUNTER — Other Ambulatory Visit: Payer: Self-pay

## 2020-07-01 MED ORDER — CIPROFLOXACIN HCL 500 MG PO TABS: ORAL_TABLET | ORAL | 0 refills | Status: DC

## 2020-07-01 NOTE — Telephone Encounter (Signed)
Patient states he got lab results yesterday and his calcium was way too high.  He has to get labs done again next week, however he said that he takes a Vit D supplement and is wanting to know if he should stop taking this?

## 2020-07-01 NOTE — Telephone Encounter (Signed)
Spoke with pt advised to pick up Rx for Cipro and start taking, pt appointment was changed to 07/10/2020

## 2020-07-07 ENCOUNTER — Ambulatory Visit: Payer: PPO | Admitting: Family Medicine

## 2020-07-09 ENCOUNTER — Other Ambulatory Visit: Payer: Self-pay

## 2020-07-10 ENCOUNTER — Ambulatory Visit (INDEPENDENT_AMBULATORY_CARE_PROVIDER_SITE_OTHER): Payer: PPO | Admitting: Family Medicine

## 2020-07-10 ENCOUNTER — Encounter: Payer: Self-pay | Admitting: Family Medicine

## 2020-07-10 VITALS — BP 140/72 | HR 78 | Temp 97.7°F | Wt 273.4 lb

## 2020-07-10 DIAGNOSIS — R319 Hematuria, unspecified: Secondary | ICD-10-CM | POA: Diagnosis not present

## 2020-07-10 DIAGNOSIS — E213 Hyperparathyroidism, unspecified: Secondary | ICD-10-CM

## 2020-07-10 LAB — POC URINALSYSI DIPSTICK (AUTOMATED)
Bilirubin, UA: NEGATIVE
Glucose, UA: NEGATIVE
Ketones, UA: NEGATIVE
Leukocytes, UA: NEGATIVE
Nitrite, UA: NEGATIVE
Protein, UA: POSITIVE — AB
Spec Grav, UA: 1.02 (ref 1.010–1.025)
Urobilinogen, UA: 0.2 E.U./dL
pH, UA: 6 (ref 5.0–8.0)

## 2020-07-10 NOTE — Progress Notes (Signed)
   Subjective:    Patient ID: [name], male    DOB: [birth year], 70 y.o.   MRN: [id number]  HPI Here to follow up some lab abnormalities from his well exam on 06-29-20. He had no complaints that day, but his urine contained blood and leukocytes and his calcium was elevated to 11.7. We treated him with a course of Cipro. Today he feels fine. He has no urinary urgency or burning, but he does have urine that is dark on occasion. He takes vitamin D but no calcium supplements.    Review of Systems  Constitutional: Negative.   Respiratory: Negative.   Cardiovascular: Negative.   Genitourinary: Negative.        Objective:   Physical Exam Constitutional:      Appearance: He is not ill-appearing.  Cardiovascular:     Rate and Rhythm: Normal rate and regular rhythm.     Pulses: Normal pulses.     Heart sounds: Normal heart sounds.  Pulmonary:     Effort: Pulmonary effort is normal.     Breath sounds: Normal breath sounds.  Neurological:     Mental Status: He is alert.           Assessment & Plan:  It appears his UTI has resolved, but we will send the sample for a culture. We will check a PTH today for the hypercalcemia. Alysia Penna, MD

## 2020-07-11 LAB — PTH, INTACT AND CALCIUM
Calcium: 11 mg/dL — ABNORMAL HIGH (ref 8.6–10.3)
PTH: 93 pg/mL — ABNORMAL HIGH (ref 16–77)

## 2020-07-11 LAB — URINE CULTURE
MICRO NUMBER:: 11859805
SPECIMEN QUALITY:: ADEQUATE

## 2020-07-13 NOTE — Addendum Note (Signed)
Addended by: Alysia Penna A on: 07/13/2020 12:48 PM   Modules accepted: Orders

## 2020-07-14 ENCOUNTER — Encounter: Payer: Self-pay | Admitting: Radiology

## 2020-07-14 ENCOUNTER — Encounter: Payer: Self-pay | Admitting: Physician Assistant

## 2020-07-14 ENCOUNTER — Other Ambulatory Visit: Payer: Self-pay

## 2020-07-14 ENCOUNTER — Ambulatory Visit (INDEPENDENT_AMBULATORY_CARE_PROVIDER_SITE_OTHER): Payer: PPO

## 2020-07-14 ENCOUNTER — Ambulatory Visit: Payer: PPO | Admitting: Physician Assistant

## 2020-07-14 VITALS — BP 122/80 | HR 53 | Ht 70.0 in | Wt 274.2 lb

## 2020-07-14 DIAGNOSIS — E785 Hyperlipidemia, unspecified: Secondary | ICD-10-CM

## 2020-07-14 DIAGNOSIS — I10 Essential (primary) hypertension: Secondary | ICD-10-CM

## 2020-07-14 DIAGNOSIS — I4821 Permanent atrial fibrillation: Secondary | ICD-10-CM | POA: Diagnosis not present

## 2020-07-14 DIAGNOSIS — Z7901 Long term (current) use of anticoagulants: Secondary | ICD-10-CM

## 2020-07-14 DIAGNOSIS — I350 Nonrheumatic aortic (valve) stenosis: Secondary | ICD-10-CM

## 2020-07-14 DIAGNOSIS — I340 Nonrheumatic mitral (valve) insufficiency: Secondary | ICD-10-CM | POA: Diagnosis not present

## 2020-07-14 DIAGNOSIS — R001 Bradycardia, unspecified: Secondary | ICD-10-CM | POA: Diagnosis not present

## 2020-07-14 NOTE — Progress Notes (Signed)
Cardiology Office Note:    Date:  07/14/2020   ID:  SAYVON ARTERBERRY, DOB 09-08-1950, MRN [id number]  PCP:  [name], MD   [facility] Providers Cardiologist:  [name], MD     Referring MD: [name], MD   Chief Complaint:  Follow-up (AFib)    Patient Profile:    [name] is a 70 y.o. male with:   Permanent atrial fibrillation   Mitral regurgitation (trivial on Echocardiogram in 2018)  Mild aortic stenosis (mean 21 mmHg on Echocardiogram in 2018)  Hypertension   OSA   Obesity   Prior CV studies: VAS US CAROTID DUPLEX BILATERAL 04/05/2019 Narrative Carotid Arterial Duplex Study Summary: Right Carotid: Velocities in the right ICA are consistent with a 1-39% stenosis. Non-hemodynamically significant plaque <50% noted in the CCA. Left Carotid: Velocities in the left ICA are consistent with a 1-39% stenosis. Non-hemodynamically significant plaque <50% noted in the CCA. Vertebrals:  Bilateral vertebral arteries demonstrate antegrade flow. Subclavians: Normal flow hemodynamics were seen in bilateral subclavian Arteries.  Echo 08/19/16 Mild LVH, EF 65-70, mild AS (mean 21, peak 41), trivial MR, mild LAE, small eff   Echocardiogram 02/26/2014 Moderate LVH, EF 55-60, calcified aortic valve (mean 15), moderate LAE, trivial effusion   History of Present Illness: [name] was last seen by Dr. Harrington Challenger in 1/21.  He returns for f/u.  He is here today with his nephew.  He arrives in a wheelchair.  He mainly walks with a walker but rode in a wheelchair today for convenience.  He has a lot of back and joint problems that limits his mobility.  He has not had any chest pain, shortness of breath, syncope, near syncope, orthopnea.  He has chronic lower extremity swelling without significant change.        Past Medical History:  Diagnosis Date  . Adenomatous colon polyp 07/2003  . Atrial fibrillation [facility])    sees Dr. [name]   . Heart valve problem    Dr [name]   . Hypertension   . Mitral valve disorders(424.0)   . Obesity   . OSA (obstructive sleep apnea)    cpap  . Other and unspecified hyperlipidemia   . Sleep apnea     Current Medications: Current Meds  Medication Sig  . amLODipine (NORVASC) 10 MG tablet Take 1 tablet (10 mg total) by mouth daily.  . carvedilol (COREG) 25 MG tablet Take 1 tablet (25 mg total) by mouth 2 (two) times daily.  . cloNIDine (CATAPRES) 0.2 MG tablet Take 1 tablet (0.2 mg total) by mouth 2 (two) times daily.  Marland Kitchen doxazosin (CARDURA) 4 MG tablet Take 1 tablet (4 mg total) by mouth daily.  Marland Kitchen ezetimibe (ZETIA) 10 MG tablet Take 1 tablet (10 mg total) by mouth daily.  . fish oil-omega-3 fatty acids 1000 MG capsule Take 1 g by mouth daily.  . hydrochlorothiazide (MICROZIDE) 12.5 MG capsule Take 1 capsule (12.5 mg total) by mouth daily.  Marland Kitchen ketoconazole (NIZORAL) 2 % cream Apply 1 application topically 2 (two) times daily as needed for irritation.  Marland Kitchen losartan (COZAAR) 100 MG tablet Take 1 tablet (100 mg total) by mouth daily.  Marland Kitchen lubiprostone (AMITIZA) 24 MCG capsule Take 1 capsule (24 mcg total) by mouth 2 (two) times daily.  . meloxicam (MOBIC) 15 MG tablet Take 1 tablet (15 mg total) by mouth daily.  Marland Kitchen warfarin (COUMADIN) 5 MG tablet TAKE ONE TO ONE AND ONE-HALF TABLETS BY MOUTH AS  DIRECTED BY THE COUMADIN CLINIC     Allergies:   Statins and Penicillins   Social History   Tobacco Use  . Smoking status: Never Smoker  . Smokeless tobacco: Never Used  Vaping Use  . Vaping Use: Never used  Substance Use Topics  . Alcohol use: No    Alcohol/week: 0.0 standard drinks  . Drug use: No     Family Hx: The patient's family history includes Alzheimer's disease in his maternal aunt and mother; Heart disease in his father; Hypertension in his father; Stroke in his father. There is no history of Colon cancer.  Review of Systems  Gastrointestinal: Negative for hematochezia.  Genitourinary: Positive for hematuria (recent  tx for UTI).     EKGs/Labs/Other Test Reviewed:    EKG:  EKG is   ordered today.  The ekg ordered today demonstrates atrial fibrillation, HR 53, LAD, RBBB, QTC 403  Recent Labs: 06/29/2020: ALT 19; BUN 28; Creatinine, Ser 1.00; Hemoglobin 12.9; Platelets 214.0; Potassium 4.5; Sodium 137; TSH 1.59   Recent Lipid Panel Lab Results  Component Value Date/Time   CHOL 140 06/29/2020 09:59 AM   CHOL 163 03/29/2019 04:55 PM   TRIG 104.0 06/29/2020 09:59 AM   HDL 33.00 (L) 06/29/2020 09:59 AM   HDL 40 03/29/2019 04:55 PM   CHOLHDL 4 06/29/2020 09:59 AM   LDLCALC 86 06/29/2020 09:59 AM   LDLCALC 103 (H) 03/29/2019 04:55 PM   LDLDIRECT 168.0 03/25/2014 08:44 AM      Risk Assessment/Calculations:    CHA2DS2-VASc Score = 2  This indicates a 2.2% annual risk of stroke. The patient's score is based upon: CHF History: No HTN History: Yes Diabetes History: No Stroke History: No Vascular Disease History: No Age Score: 1 Gender Score: 0     Physical Exam:    VS:  BP 122/80   Pulse (!) 53   Ht 5\' 10"  (1.778 m)   Wt 274 lb 3.2 oz (124.4 kg)   SpO2 98%   BMI 39.34 kg/m     Wt Readings from Last 3 Encounters:  07/14/20 274 lb 3.2 oz (124.4 kg)  07/10/20 273 lb 6.4 oz (124 kg)  06/29/20 271 lb (122.9 kg)     Constitutional:      Appearance: Healthy appearance. Not in distress.  Pulmonary:     Effort: Pulmonary effort is normal.     Breath sounds: No wheezing. No rales.  Cardiovascular:     Bradycardia present. Irregularly irregular rhythm. Normal S1. Normal S2.     Murmurs: There is no murmur.  Edema:    Pretibial: bilateral trace edema of the pretibial area.    Comments: + Stasis changes Abdominal:     Palpations: Abdomen is soft.  Musculoskeletal:     Cervical back: Neck supple. Skin:    General: Skin is warm and dry.  Neurological:     General: No focal deficit present.     Mental Status: Alert and oriented to person, place and time.     Cranial Nerves: Cranial  nerves are intact.         ASSESSMENT & PLAN:    1. Permanent atrial fibrillation ([place]) 2. Bradycardia 3. Long term current use of anticoagulant Rate is somewhat slow today.  He is asymptomatic.  His blood pressure is well controlled on his current medical therapy.  I will arrange for a 3-day ZIO to assess heart rate control and rule out significant pauses.  If needed, I will decrease his carvedilol.  Recent hemoglobin, creatinine normal.  Coumadin is managed in our Coumadin clinic.  Follow-up in 6 months.  4. Essential hypertension The patient's blood pressure is controlled on his current regimen.  Continue current therapy.   5. Nonrheumatic aortic valve stenosis 6. Nonrheumatic mitral valve regurgitation Mitral regurgitation has been difficult to assess in the past.  Last echocardiogram demonstrated mild aortic stenosis with mean gradient 21.  It has been 4 years since his last echocardiogram.  Arrange follow-up 2D echocardiogram.  7. Dyslipidemia He has been intolerant of statins in the past.  He is currently on ezetimibe.  Most recent LDL was 86.  This is fair control.  He is not interested in pursuing alternative therapies.  If his LDL should increase further, we can revisit whether or not to pursue PCSK9 inhibitor therapy versus bempedoic acid versus Inclisiran.    Dispo:  Return in about 6 months (around 01/14/2021) for Routine Follow Up, w/ Dr. Harrington Challenger, or Richardson Dopp, PA-C.   Medication Adjustments/Labs and Tests Ordered: Current medicines are reviewed at length with the patient today.  Concerns regarding medicines are outlined above.  Tests Ordered: Orders Placed This Encounter  Procedures  . LONG TERM MONITOR (3-14 DAYS)  . EKG 12-Lead  . ECHOCARDIOGRAM COMPLETE   Medication Changes: No orders of the defined types were placed in this encounter.   Signed, Richardson Dopp, PA-C  07/14/2020 5:11 PM    [facility] [address], [place], [place]   [address] Phone: [phone]; Fax: [phone]

## 2020-07-14 NOTE — Progress Notes (Signed)
Enrolled patient for a 3 day Zio XT monitor to be mailed to patients home  

## 2020-07-14 NOTE — Patient Instructions (Addendum)
Medication Instructions:  NO CHANGES *If you need a refill on your cardiac medications before your next appointment, please call your pharmacy*   Lab Work: NONE If you have labs (blood work) drawn today and your tests are completely normal, you will receive your results only by: Marland Kitchen MyChart Message (if you have MyChart) OR . A paper copy in the mail If you have any lab test that is abnormal or we need to change your treatment, we will call you to review the results.   Testing/Procedures: Your physician has requested that you have an echocardiogram. Echocardiography is a painless test that uses sound waves to create images of your heart. It provides your doctor with information about the size and shape of your heart and how well your heart's chambers and valves are working. This procedure takes approximately one hour. There are no restrictions for this procedure.  ZIO XT- Long Term Monitor Instructions   Your physician has requested you wear a ZIO patch monitor for ___ days.  This is a single patch monitor.   [employer] supplies one patch monitor per enrollment. Additional stickers are not available. Please do not apply patch if you will be having a Nuclear Stress Test, Echocardiogram, Cardiac CT, MRI, or Chest Xray during the period you would be wearing the monitor. The patch cannot be worn during these tests. You cannot remove and re-apply the ZIO XT patch monitor.  Your ZIO patch monitor will be sent Fed Ex from [employer] directly to your home address. It may take 3-5 days to receive your monitor after you have been enrolled.  Once you have received your monitor, please review the enclosed instructions. Your monitor has already been registered assigning a specific monitor serial # to you.  Billing and Patient Assistance Program Information   We have supplied [employer] with any of your insurance information on file for billing purposes. [employer] offers a sliding scale Patient Assistance  Program for patients that do not have insurance, or whose insurance does not completely cover the cost of the ZIO monitor.   You must apply for the Patient Assistance Program to qualify for this discounted rate.     To apply, please call [employer] at [phone], select option 4, then select option 2, and ask to apply for Patient Assistance Program.  Theodore Demark will ask your household income, and how many people are in your household.  They will quote your out-of-pocket cost based on that information.  [employer] will also be able to set up a 84-month, interest-free payment plan if needed.  Applying the monitor   Shave hair from upper left chest.  Hold abrader disc by orange tab. Rub abrader in 40 strokes over the upper left chest as indicated in your monitor instructions.  Clean area with 4 enclosed alcohol pads. Let dry.  Apply patch as indicated in monitor instructions. Patch will be placed under collarbone on left side of chest with arrow pointing upward.  Rub patch adhesive wings for 2 minutes. Remove white label marked "1". Remove the white label marked "2". Rub patch adhesive wings for 2 additional minutes.  While looking in a mirror, press and release button in center of patch. A small green light will flash 3-4 times. This will be your only indicator that the monitor has been turned on. ?  Do not shower for the first 24 hours. You may shower after the first 24 hours.  Press the button if you feel a symptom. You will hear a small click.  Record Date, Time and Symptom in the Patient Logbook.  When you are ready to remove the patch, follow instructions on the last 2 pages of the Patient Logbook. Stick patch monitor onto the last page of Patient Logbook.  Place Patient Logbook in the blue and white box.  Use locking tab on box and tape box closed securely.  The blue and white box has prepaid postage on it. Please place it in the mailbox as soon as possible. Your physician should have your test results  approximately 7 days after the monitor has been mailed back to [facility].  Call [place] at [phone] if you have questions regarding your ZIO XT patch monitor. Call them immediately if you see an orange light blinking on your monitor.  If your monitor falls off in less than 4 days, contact our Monitor department at [phone]. ?If your monitor becomes loose or falls off after 4 days call [employer] at [phone] for suggestions on securing your monitor.?    Follow-Up: At [facility], you and your health needs are our priority.  As part of our continuing mission to provide you with exceptional heart care, we have created designated Provider Care Teams.  These Care Teams include your primary Cardiologist (physician) and Advanced Practice Providers (APPs -  Physician Assistants and Nurse Practitioners) who all work together to provide you with the care you need, when you need it.  We recommend signing up for the patient portal called "MyChart".  Sign up information is provided on this After Visit Summary.  MyChart is used to connect with patients for Virtual Visits (Telemedicine).  Patients are able to view lab/test results, encounter notes, upcoming appointments, etc.  Non-urgent messages can be sent to your provider as well.   To learn more about what you can do with MyChart, go to [email or web address].    Your next appointment:   6 month(s)  The format for your next appointment:   In Person  Provider:   You may see [name], MD or one of the following Advanced Practice Providers on your designated Care Team:    Richardson Dopp, PA-C  Robbie Lis, [place]    Other Instructions NONE

## 2020-07-16 ENCOUNTER — Other Ambulatory Visit: Payer: Self-pay | Admitting: Internal Medicine

## 2020-07-16 NOTE — Telephone Encounter (Signed)
Prescription refill request received for warfarin Lov: weaver, 07/14/2020 Next INR check: 5/18 Warfarin tablet strength: 5mg 

## 2020-07-17 ENCOUNTER — Telehealth: Payer: Self-pay | Admitting: Family Medicine

## 2020-07-17 NOTE — Telephone Encounter (Signed)
Pt is calling in stating that he is wanting to know why he was referred to a Endocrinologist b/c his A1C has been running around the same.  Pt would like to have a call back.

## 2020-07-18 DIAGNOSIS — R001 Bradycardia, unspecified: Secondary | ICD-10-CM

## 2020-07-18 DIAGNOSIS — I4821 Permanent atrial fibrillation: Secondary | ICD-10-CM

## 2020-07-21 NOTE — Telephone Encounter (Signed)
Spoke with [name] advised the reasons Dr Sarajane Jews referred [name] to endocrinology, [name] verbalized understanding

## 2020-07-22 ENCOUNTER — Other Ambulatory Visit: Payer: Self-pay

## 2020-07-22 ENCOUNTER — Ambulatory Visit (INDEPENDENT_AMBULATORY_CARE_PROVIDER_SITE_OTHER): Payer: PPO | Admitting: *Deleted

## 2020-07-22 DIAGNOSIS — I4891 Unspecified atrial fibrillation: Secondary | ICD-10-CM | POA: Diagnosis not present

## 2020-07-22 DIAGNOSIS — Z5181 Encounter for therapeutic drug level monitoring: Secondary | ICD-10-CM

## 2020-07-22 LAB — POCT INR: INR: 2.6 (ref 2.0–3.0)

## 2020-07-22 NOTE — Patient Instructions (Signed)
Description   Continue taking Warfarin 1.5 tablets (7.5mg) daily. Call us with any new medications scheduled for any procedures or any questions. Recheck in 8 weeks. Phone [phone] [facility] Main # [phone] fax [phone]     

## 2020-07-27 ENCOUNTER — Telehealth: Payer: Self-pay | Admitting: Internal Medicine

## 2020-07-27 DIAGNOSIS — I4821 Permanent atrial fibrillation: Secondary | ICD-10-CM | POA: Diagnosis not present

## 2020-07-27 DIAGNOSIS — R001 Bradycardia, unspecified: Secondary | ICD-10-CM | POA: Diagnosis not present

## 2020-07-27 NOTE — Telephone Encounter (Signed)
End of report Zio. 5/17 at 9:57 am Slow afib 38 beats per min for 60 seconds.  Report is in [employer] box to read.

## 2020-07-27 NOTE — Telephone Encounter (Signed)
Tia from [facility] calling with abnormal zio patch results.

## 2020-07-29 MED ORDER — CARVEDILOL 12.5 MG PO TABS: 18.7500 mg | ORAL_TABLET | Freq: Two times a day (BID) | ORAL | 3 refills | Status: DC

## 2020-07-29 NOTE — Telephone Encounter (Signed)
Reviewed monitor. Some pauses at 9-10 am (likely not during hours of sleep).  Pt was asymptomatic at recent OV.  PLAN:  1. Reduce Carvedilol to 18.75 mg twice daily  2. RN visit in 1 month for EKG  Richardson Dopp, PA-C    07/29/2020 7:43 AM

## 2020-07-29 NOTE — Telephone Encounter (Signed)
Called patient reviewed results and provider recommendations.  He verbalizes understanding all questions were answered.

## 2020-08-07 ENCOUNTER — Encounter: Payer: Self-pay | Admitting: Physician Assistant

## 2020-08-07 DIAGNOSIS — I4821 Permanent atrial fibrillation: Secondary | ICD-10-CM | POA: Insufficient documentation

## 2020-08-28 ENCOUNTER — Ambulatory Visit: Payer: PPO | Admitting: Gastroenterology

## 2020-09-02 ENCOUNTER — Ambulatory Visit: Payer: PPO

## 2020-09-16 ENCOUNTER — Other Ambulatory Visit: Payer: Self-pay

## 2020-09-16 ENCOUNTER — Ambulatory Visit (INDEPENDENT_AMBULATORY_CARE_PROVIDER_SITE_OTHER): Payer: PPO

## 2020-09-16 ENCOUNTER — Ambulatory Visit (INDEPENDENT_AMBULATORY_CARE_PROVIDER_SITE_OTHER): Payer: PPO | Admitting: *Deleted

## 2020-09-16 VITALS — HR 69 | Ht 70.0 in | Wt 271.2 lb

## 2020-09-16 DIAGNOSIS — Z5181 Encounter for therapeutic drug level monitoring: Secondary | ICD-10-CM

## 2020-09-16 DIAGNOSIS — Z79899 Other long term (current) drug therapy: Secondary | ICD-10-CM | POA: Diagnosis not present

## 2020-09-16 DIAGNOSIS — I4821 Permanent atrial fibrillation: Secondary | ICD-10-CM

## 2020-09-16 DIAGNOSIS — I4891 Unspecified atrial fibrillation: Secondary | ICD-10-CM | POA: Diagnosis not present

## 2020-09-16 LAB — POCT INR: INR: 2.5 (ref 2.0–3.0)

## 2020-09-16 NOTE — Progress Notes (Signed)
Agree Continue current medications. Richardson Dopp, PA-C    09/16/2020 4:44 PM

## 2020-09-16 NOTE — Progress Notes (Addendum)
Reason for visit: EKG post carvedilol decrease to 12.5 mg PO BID  Name of MD requesting visit: Richardson Dopp, PA  H&P: Pt states he feels okay since carvedilol dose decreased 1 month ago.  ROS related to problem: Pt reports that he feels fine.  Assessment and plan per MD: Richardson Dopp, PA reviewed EKG shows Afib HR 69, ordered pt to continue current regimen.

## 2020-09-16 NOTE — Patient Instructions (Signed)
Description   Continue taking Warfarin 1.5 tablets (7.5mg) daily. Call us with any new medications scheduled for any procedures or any questions. Recheck in 8 weeks. Phone [phone] [facility] Main # [phone] fax [phone]     

## 2020-09-20 ENCOUNTER — Other Ambulatory Visit: Payer: Self-pay | Admitting: Internal Medicine

## 2020-09-21 NOTE — Telephone Encounter (Signed)
Prescription refill request received for warfarin Lov: weaver, 07/14/2020 Next INR check: 9/7 Warfarin tablet strength: 5mg 

## 2020-09-23 ENCOUNTER — Encounter: Payer: Self-pay | Admitting: Endocrinology

## 2020-09-23 ENCOUNTER — Other Ambulatory Visit: Payer: Self-pay

## 2020-09-23 ENCOUNTER — Telehealth: Payer: Self-pay | Admitting: Pharmacist

## 2020-09-23 ENCOUNTER — Ambulatory Visit (INDEPENDENT_AMBULATORY_CARE_PROVIDER_SITE_OTHER): Payer: PPO | Admitting: Endocrinology

## 2020-09-23 NOTE — Patient Instructions (Signed)
My advice is to reduce the HCTZ and add another fluid pill.     We would then try to phase out the HCTZ, to see if that helps the high calcium.   You should also have a bone density X-ray.   Please think this advice over, and let me know. I would be happy to see you back here as needed.

## 2020-09-23 NOTE — Chronic Care Management (AMB) (Signed)
Chronic Care Management Pharmacy Assistant   Name: [name]  MRN: [id number] DOB: September 21, 1950  Reason for Encounter: Disease State   Conditions to be addressed/monitored: HTN  Recent office visits:  05.06.2022 [name], MD patient seen for acute visit for a UTI 04.25.2022 [name], MD patient present for wellness exam. Medication prescribed Ketoconazole 2 % 1 application Topical 2 times daily PRN  Recent consult visits:  05.14.2022 Fay Records Cardiology patient seen for follow-up Permanent atrial fibrillation and Bradycardia. 05.10.2022 Liliane Shi, PA-C Cardiology patient seen for follow-up Permanent atrial fibrillation and Bradycardia. Medication discontinued Cholecalciferol 5,000 Units and Ciprofloxacin HCl 500 mg.  Hospital visits:  None in previous 6 months  Medications: Outpatient Encounter Medications as of 09/23/2020  Medication Sig   amLODipine (NORVASC) 10 MG tablet Take 1 tablet (10 mg total) by mouth daily.   carvedilol (COREG) 12.5 MG tablet Take 1.5 tablets (18.75 mg total) by mouth 2 (two) times daily.   cloNIDine (CATAPRES) 0.2 MG tablet Take 1 tablet (0.2 mg total) by mouth 2 (two) times daily.   doxazosin (CARDURA) 4 MG tablet Take 1 tablet (4 mg total) by mouth daily.   ezetimibe (ZETIA) 10 MG tablet Take 1 tablet (10 mg total) by mouth daily.   fish oil-omega-3 fatty acids 1000 MG capsule Take 1 g by mouth daily.   hydrochlorothiazide (MICROZIDE) 12.5 MG capsule Take 1 capsule (12.5 mg total) by mouth daily.   ketoconazole (NIZORAL) 2 % cream Apply 1 application topically 2 (two) times daily as needed for irritation.   losartan (COZAAR) 100 MG tablet Take 1 tablet (100 mg total) by mouth daily.   lubiprostone (AMITIZA) 24 MCG capsule Take 1 capsule (24 mcg total) by mouth 2 (two) times daily.   meloxicam (MOBIC) 15 MG tablet Take 1 tablet (15 mg total) by mouth daily.   warfarin (COUMADIN) 5 MG tablet TAKE 1 TO 1  AND 1/2 TABLET BY MOUTH  AS DIRECTED BY THE COUMADIN CLINIC   No facility-administered encounter medications on file as of 09/23/2020.   Reviewed chart prior to disease state call. Spoke with patient regarding BP  Recent Office Vitals: BP Readings from Last 3 Encounters:  07/14/20 122/80  07/10/20 140/72  06/29/20 140/78   Pulse Readings from Last 3 Encounters:  09/16/20 69  07/14/20 (!) 53  07/10/20 78    Wt Readings from Last 3 Encounters:  09/16/20 271 lb 3.2 oz (123 kg)  07/14/20 274 lb 3.2 oz (124.4 kg)  07/10/20 273 lb 6.4 oz (124 kg)     Kidney Function Lab Results  Component Value Date/Time   CREATININE 1.00 06/29/2020 09:59 AM   CREATININE 1.18 04/17/2019 12:00 AM   GFR 76.27 06/29/2020 09:59 AM   GFRNONAA 63 04/17/2019 12:00 AM   GFRAA 72 04/17/2019 12:00 AM    BMP Latest Ref Rng & Units 07/10/2020 06/29/2020 04/17/2019  Glucose 70 - 99 mg/dL - 106(H) 104(H)  BUN 6 - 23 mg/dL - 28(H) 30(H)  Creatinine 0.40 - 1.50 mg/dL - 1.00 1.18  BUN/Creat Ratio 10 - 24 - - 25(H)  Sodium 135 - 145 mEq/L - 137 140  Potassium 3.5 - 5.1 mEq/L - 4.5 4.2  Chloride 96 - 112 mEq/L - 104 100  CO2 19 - 32 mEq/L - 28 21  Calcium 8.6 - 10.3 mg/dL 11.0(H) 11.7(H) 11.4(H)   Current antihypertensive regimen:  Amlodipine 10 mg, 1 tablet daily Carvedilol 25 mg, 1 tablet twice daily  Clonidine 0.2 mg, 1 tablet twice daily Doxazosin 4 mg, 1 tablet once daily HCTZ 12.5 mg, 1 capsule daily Losartan 100 mg, 1 tablet daily  How often are you checking your Blood Pressure? infrequently Current home BP readings: None What recent interventions/DTPs have been made by any provider to improve Blood Pressure control since last CPP Visit: None Any recent hospitalizations or ED visits since last visit with CPP? No What diet changes have been made to improve Blood Pressure Control?  No Change What exercise is being done to improve your Blood Pressure Control?  No Change  Adherence Review: Is the patient currently on  ACE/ARB medication? Yes Does the patient have >5 day gap between last estimated fill dates? No  I spoke with the patient about medication adherence. He states that he has been doing fair. He did not have any recent blood pressure readings. At the time of the call, he was on his way to a new patient appointment. He stated that he would know more about his blood pressure after he went to this appointment. There have been no recent changes in his medications. He stated he was not having any side effects from his current medications. There have been no recent falls or injuries. There have been no emergency department hospital visits or urgent care visits since his last CPP or PCP appointment. He currently uses [facility] and is not having any issues. He did not schedule a CCM appointment at the time of the call.  Care Gaps: Hepatitis C screening TDAP Zoster Vaccines COVID-19 4th booster Colonoscopy Star Rating Drugs: Medication Dispensed Quantity Pharmacy  Losartan 100 mg 05.14.2022 [place] [facility] [place], [place] Pharmacist Assistant [phone]

## 2020-09-23 NOTE — Progress Notes (Signed)
Subjective:    Patient ID: [name], male    DOB: April 03, 1950, 70 y.o.   MRN: [id number]  HPI Pt is referred by Dr Sarajane Jews, for hypercalcemia.  Pt was noted to have hypercalcemia in 2008. He has never had osteoporosis, thyroid probs, sarcoidosis, cancer, PUD, pancreatitis, or bony fracture.  He does not take vitamin-A supplement.  Pt denies taking antacids or Li++.  He has been off Vit-D supplement x approx 5 months. He had urolithiasis in approx 2004.  He takes HCTZ.  He has intermitt back pain.   Past Medical History:  Diagnosis Date   Adenomatous colon polyp 07/2003   Heart valve problem    Dr [name]   Hypertension    Mitral valve disorders(424.0)    Obesity    OSA (obstructive sleep apnea)    cpap   Other and unspecified hyperlipidemia    Permanent atrial fibrillation ([place])    Zio Monitor 5/22: AFib - Avg HR 55; slowest 32; longest pause 3.9 sec; occ PVCs, 4 beat NSVT   Sleep apnea     Past Surgical History:  Procedure Laterality Date   COLONOSCOPY  06-09-15   per Dr. Fuller Plan, adenomatous polyp, repeat in 5 yrs    FOOT SURGERY  at age 53   LUMBAR LAMINECTOMY/DECOMPRESSION MICRODISCECTOMY N/A 11/21/2012   Procedure: Lumbar Three to Lumbar Five Laminectomy;  Surgeon: Floyce Stakes, MD;  Location: [facility];  Service: Neurosurgery;  Laterality: N/A;  Lumbar Three to Lumbar Five Laminectomy    Social History   Socioeconomic History   Marital status: Single    Spouse name: Not on file   Number of children: 0   Years of education: Not on file   Highest education level: Not on file  Occupational History   Occupation: retired  Tobacco Use   Smoking status: Never   Smokeless tobacco: Never  Vaping Use   Vaping Use: Never used  Substance and Sexual Activity   Alcohol use: No    Alcohol/week: 0.0 standard drinks   Drug use: No   Sexual activity: Not on file  Other Topics Concern   Not on file  Social History Narrative   Not on file   Social Determinants of Health    Financial Resource Strain: Not on file  Food Insecurity: Not on file  Transportation Needs: No Transportation Needs   Lack of Transportation (Medical): No   Lack of Transportation (Non-Medical): No  Physical Activity: Not on file  Stress: Not on file  Social Connections: Not on file  Intimate Partner Violence: Not on file    Current Outpatient Medications on File Prior to Visit  Medication Sig Dispense Refill   amLODipine (NORVASC) 10 MG tablet Take 1 tablet (10 mg total) by mouth daily. 90 tablet 3   carvedilol (COREG) 12.5 MG tablet Take 1.5 tablets (18.75 mg total) by mouth 2 (two) times daily. 180 tablet 3   cloNIDine (CATAPRES) 0.2 MG tablet Take 1 tablet (0.2 mg total) by mouth 2 (two) times daily. 180 tablet 3   doxazosin (CARDURA) 4 MG tablet Take 1 tablet (4 mg total) by mouth daily. 90 tablet 3   ezetimibe (ZETIA) 10 MG tablet Take 1 tablet (10 mg total) by mouth daily. 90 tablet 3   fish oil-omega-3 fatty acids 1000 MG capsule Take 1 g by mouth daily.     hydrochlorothiazide (MICROZIDE) 12.5 MG capsule Take 1 capsule (12.5 mg total) by mouth daily. 90 capsule 3  ketoconazole (NIZORAL) 2 % cream Apply 1 application topically 2 (two) times daily as needed for irritation. 30 g 2   losartan (COZAAR) 100 MG tablet Take 1 tablet (100 mg total) by mouth daily. 90 tablet 3   lubiprostone (AMITIZA) 24 MCG capsule Take 1 capsule (24 mcg total) by mouth 2 (two) times daily. 180 capsule 3   meloxicam (MOBIC) 15 MG tablet Take 1 tablet (15 mg total) by mouth daily. 90 tablet 3   warfarin (COUMADIN) 5 MG tablet TAKE 1 TO 1  AND 1/2 TABLET BY MOUTH AS DIRECTED BY THE COUMADIN CLINIC 50 tablet 2   No current facility-administered medications on file prior to visit.    Allergies  Allergen Reactions   Statins    Penicillins     REACTION: unspecified    Family History  Problem Relation Age of Onset   Alzheimer's disease Mother    Stroke Father    Heart disease Father     Hypertension Father    Alzheimer's disease Maternal Aunt        x 3   Colon cancer Neg Hx    Hypercalcemia Neg Hx     BP (!) 146/74   Pulse 66   Ht 5\' 10"  (1.778 m)   Wt 268 lb (121.6 kg)   SpO2 96%   BMI 38.45 kg/m     Review of Systems Denies weight loss, depression, numbness, and back pain.      Objective:   Physical Exam VS: see vs page GEN: no distress.  [facility]: head: no deformity eyes: no periorbital swelling, no proptosis external nose and ears are normal NECK: supple, thyroid is not enlarged CHEST WALL: no deformity.  No kyphosis.   LUNGS: clear to auscultation CV: reg rate and rhythm, no murmur.  MUSCULOSKELETAL: gait is normal and steady EXTEMITIES: no deformity.  2+ bilat leg edema NEURO:  readily moves all 4's.  sensation is intact to touch on all 4's SKIN:  Normal texture and temperature.  No rash or suspicious lesion is visible.   NODES:  None palpable at the neck PSYCH: alert, well-oriented.  Does not appear anxious nor depressed.   Lab Results  Component Value Date   PTH 93 (H) 07/10/2020   CALCIUM 11.0 (H) 07/10/2020   Lab Results  Component Value Date   TSH 1.59 06/29/2020   Lab Results  Component Value Date   CREATININE 1.00 06/29/2020   BUN 28 (H) 06/29/2020   NA 137 06/29/2020   K 4.5 06/29/2020   CL 104 06/29/2020   CO2 28 06/29/2020    I have reviewed outside records, and summarized: Pt was noted to have elevated Ca++, and referred here.  He was noted to have normal BP on rx, at several different medical offices over the past year.      Assessment & Plan:  Hypercalcemia, new to me, uncertain etiology and prognosis.  Prob due to primary hyperparathyroidism.  HTN: uncontrolled today.  We discussed.  In order to properly rx the above, we would need to make a series of med adjustments.  Pt says he'll consider.    Patient Instructions  My advice is to reduce the HCTZ and add another fluid pill.     We would then try to phase out  the HCTZ, to see if that helps the high calcium.   You should also have a bone density X-ray.   Please think this advice over, and let me know. I would be  happy to see you back here as needed.

## 2020-09-29 ENCOUNTER — Encounter: Payer: Self-pay | Admitting: Gastroenterology

## 2020-09-29 ENCOUNTER — Ambulatory Visit: Payer: PPO | Admitting: Gastroenterology

## 2020-09-29 VITALS — BP 130/80 | HR 56 | Ht 70.0 in | Wt 271.0 lb

## 2020-09-29 DIAGNOSIS — Z7901 Long term (current) use of anticoagulants: Secondary | ICD-10-CM | POA: Diagnosis not present

## 2020-09-29 DIAGNOSIS — Z86010 Personal history of colonic polyps: Secondary | ICD-10-CM | POA: Diagnosis not present

## 2020-09-29 DIAGNOSIS — K5904 Chronic idiopathic constipation: Secondary | ICD-10-CM | POA: Diagnosis not present

## 2020-09-29 NOTE — Patient Instructions (Signed)
You will be due for a recall colonoscopy in 06/2022. We will send you a reminder in the mail when it gets closer to that time.  The [place] [facility] providers would like to encourage you to use [facility] to communicate with providers for non-urgent requests or questions.  Due to long hold times on the telephone, sending your provider a message by [facility] may be a faster and more efficient way to get a response.  Please allow 48 business hours for a response.  Please remember that this is for non-urgent requests.   Normal BMI (Body Mass Index- based on height and weight) is between 23 and 30. Your BMI today is Body mass index is 38.88 kg/m. Marland Kitchen Please consider follow up  regarding your BMI with your Primary Care Provider.  Thank you for choosing me and [facility].  Pricilla Riffle. Dagoberto Ligas., MD., Marval Regal

## 2020-09-29 NOTE — Progress Notes (Signed)
History of Present Illness: This is a 70 year old male referred by [name], MD for the evaluation of a personal history of adenomatous colon polyps.  He is accompanied by his son.  He has chronic atrial fibrillation and is maintained on warfarin.  Colonoscopy in April 2017 had 1 small tubular adenoma.  His prior colonoscopies have had 1-3 adenomatous colon polyps on each.  He has chronic constipation that has not changed.  His constipation is under fair control on Amitiza. Denies weight loss, abdominal pain, diarrhea, change in stool caliber, melena, hematochezia, nausea, vomiting, dysphagia, reflux symptoms, chest pain.    Allergies  Allergen Reactions   Statins    Penicillins     REACTION: unspecified   Outpatient Medications Prior to Visit  Medication Sig Dispense Refill   amLODipine (NORVASC) 10 MG tablet Take 1 tablet (10 mg total) by mouth daily. 90 tablet 3   carvedilol (COREG) 12.5 MG tablet Take 1.5 tablets (18.75 mg total) by mouth 2 (two) times daily. 180 tablet 3   cloNIDine (CATAPRES) 0.2 MG tablet Take 1 tablet (0.2 mg total) by mouth 2 (two) times daily. 180 tablet 3   doxazosin (CARDURA) 4 MG tablet Take 1 tablet (4 mg total) by mouth daily. 90 tablet 3   ezetimibe (ZETIA) 10 MG tablet Take 1 tablet (10 mg total) by mouth daily. 90 tablet 3   fish oil-omega-3 fatty acids 1000 MG capsule Take 1 g by mouth daily.     hydrochlorothiazide (MICROZIDE) 12.5 MG capsule Take 1 capsule (12.5 mg total) by mouth daily. 90 capsule 3   ketoconazole (NIZORAL) 2 % cream Apply 1 application topically 2 (two) times daily as needed for irritation. 30 g 2   losartan (COZAAR) 100 MG tablet Take 1 tablet (100 mg total) by mouth daily. 90 tablet 3   lubiprostone (AMITIZA) 24 MCG capsule Take 1 capsule (24 mcg total) by mouth 2 (two) times daily. 180 capsule 3   meloxicam (MOBIC) 15 MG tablet Take 1 tablet (15 mg total) by mouth daily. 90 tablet 3   warfarin (COUMADIN) 5 MG tablet TAKE 1  TO 1  AND 1/2 TABLET BY MOUTH AS DIRECTED BY THE COUMADIN CLINIC 50 tablet 2   No facility-administered medications prior to visit.   Past Medical History:  Diagnosis Date   Adenomatous colon polyp 07/2003   Heart valve problem    Dr [name]   Hypertension    Mitral valve disorders(424.0)    Obesity    OSA (obstructive sleep apnea)    cpap   Other and unspecified hyperlipidemia    Permanent atrial fibrillation ([place])    Zio Monitor 5/22: AFib - Avg HR 55; slowest 32; longest pause 3.9 sec; occ PVCs, 4 beat NSVT   Sleep apnea    Past Surgical History:  Procedure Laterality Date   COLONOSCOPY  06-09-15   per Dr. Fuller Plan, adenomatous polyp, repeat in 5 yrs    FOOT SURGERY  at age 63   LUMBAR LAMINECTOMY/DECOMPRESSION MICRODISCECTOMY N/A 11/21/2012   Procedure: Lumbar Three to Lumbar Five Laminectomy;  Surgeon: Floyce Stakes, MD;  Location: [facility];  Service: Neurosurgery;  Laterality: N/A;  Lumbar Three to Lumbar Five Laminectomy   Social History   Socioeconomic History   Marital status: Single    Spouse name: Not on file   Number of children: 0   Years of education: Not on file   Highest education level: Not on file  Occupational History   Occupation: retired  Tobacco Use   Smoking status: Never   Smokeless tobacco: Never  Vaping Use   Vaping Use: Never used  Substance and Sexual Activity   Alcohol use: No    Alcohol/week: 0.0 standard drinks   Drug use: No   Sexual activity: Not on file  Other Topics Concern   Not on file  Social History Narrative   Not on file   Social Determinants of Health   Financial Resource Strain: Not on file  Food Insecurity: Not on file  Transportation Needs: No Transportation Needs   Lack of Transportation (Medical): No   Lack of Transportation (Non-Medical): No  Physical Activity: Not on file  Stress: Not on file  Social Connections: Not on file   Family History  Problem Relation Age of Onset   Alzheimer's disease Mother     Stroke Father    Heart disease Father    Hypertension Father    Alzheimer's disease Maternal Aunt        x 3   Colon cancer Neg Hx    Hypercalcemia Neg Hx        Review of Systems: Pertinent positive and negative review of systems were noted in the above HPI section. All other review of systems were otherwise negative.   Physical Exam: General: Well developed, well nourished, no acute distress Head: Normocephalic and atraumatic Eyes: Sclerae anicteric, EOMI Ears: Normal auditory acuity Mouth: Not examined, mask on during Covid-19 pandemic Neck: Supple, no masses or thyromegaly Lungs: Clear throughout to auscultation Heart: Regular rate and rhythm; no murmurs, rubs or bruits Abdomen: Soft, non tender and non distended. No masses, hepatosplenomegaly or hernias noted. Normal Bowel sounds Rectal: Not done Musculoskeletal: Symmetrical with no gross deformities  Skin: No lesions on visible extremities Pulses:  Normal pulses noted Extremities: No clubbing, cyanosis, edema or deformities noted Neurological: Alert oriented x 4, grossly nonfocal Cervical Nodes:  No significant cervical adenopathy Inguinal Nodes: No significant inguinal adenopathy Psychological:  Alert and cooperative. Normal mood and affect   Assessment and Recommendations:  Personal history of adenomatous colon polyps.  We discussed the updated guidelines with surveillance interval recommended in 7 years with his history of multiple adenomatous polyps however his last colonoscopy had 1 small tubular adenoma.  After discussion he was agreeable to change to a 7-year interval which will be set for April 2024.  Addressed his questions and his son's questions to their satisfaction. Chronic idiopathic constipation.  Since control is fair on Amitiza offered to do change to Linzess or Trulance.  He is not ready to make a change at this time and states he will consider it and discuss with his PCP. Afib maintained on chronic  warfarin.    cc: [name], MD [address] [place],  [place] [address]

## 2020-11-06 ENCOUNTER — Telehealth: Payer: PPO

## 2020-11-11 ENCOUNTER — Ambulatory Visit (INDEPENDENT_AMBULATORY_CARE_PROVIDER_SITE_OTHER): Payer: PPO

## 2020-11-11 ENCOUNTER — Other Ambulatory Visit: Payer: Self-pay

## 2020-11-11 DIAGNOSIS — Z5181 Encounter for therapeutic drug level monitoring: Secondary | ICD-10-CM | POA: Diagnosis not present

## 2020-11-11 DIAGNOSIS — I4891 Unspecified atrial fibrillation: Secondary | ICD-10-CM

## 2020-11-11 LAB — POCT INR: INR: 2.4 (ref 2.0–3.0)

## 2020-11-11 NOTE — Patient Instructions (Signed)
Description   Continue taking Warfarin 1.5 tablets (7.5mg) daily. Call us with any new medications scheduled for any procedures or any questions. Recheck in 8 weeks. Phone [phone] [facility] Main # [phone] fax [phone]     

## 2020-12-02 ENCOUNTER — Ambulatory Visit (HOSPITAL_COMMUNITY): Payer: PPO | Attending: Physician Assistant

## 2020-12-02 ENCOUNTER — Encounter: Payer: Self-pay | Admitting: Physician Assistant

## 2020-12-02 ENCOUNTER — Other Ambulatory Visit: Payer: Self-pay

## 2020-12-02 DIAGNOSIS — I350 Nonrheumatic aortic (valve) stenosis: Secondary | ICD-10-CM

## 2020-12-02 LAB — ECHOCARDIOGRAM COMPLETE
AR max vel: 1.42 cm2
AV Area VTI: 1.52 cm2
AV Area mean vel: 1.38 cm2
AV Mean grad: 16.4 mmHg
AV Peak grad: 29.7 mmHg
Ao pk vel: 2.72 m/s
S' Lateral: 3.3 cm

## 2020-12-02 MED ORDER — PERFLUTREN LIPID MICROSPHERE
1.0000 mL | INTRAVENOUS | Status: AC | PRN
Start: 2020-12-02 — End: 2020-12-02
  Administered 2020-12-02: 3 mL via INTRAVENOUS

## 2020-12-08 ENCOUNTER — Telehealth: Payer: Self-pay | Admitting: Pharmacist

## 2020-12-08 NOTE — Chronic Care Management (AMB) (Signed)
    Chronic Care Management Pharmacy Assistant   Name: SHEDRIC FREDERICKS  MRN: [id number] DOB: May 17, 1950  12/09/2020 APPOINTMENT REMINDER   Called Pedro Earls, No answer, left message of appointment on 12/09/2020 at 2:00 via telephone visit with Jeni Salles, Pharm D. Notified to have all medications, supplements, blood pressure and/or blood sugar logs available during appointment and to return call if need to reschedule.  Care Gaps: AWV - message to schedule sent to [place] [facility] screen - never done Tetanus/TDAP - never done Shingrix - never done Covid 19 booster 4 - overdue Colonoscopy - overdue Flu vaccine - never done  Star Rating Drug: Losartan 100 mg - last filled 10/14/2020 90DS at [facility]  Any gaps in medications fill history? No    [place] [phone]

## 2020-12-09 ENCOUNTER — Ambulatory Visit (INDEPENDENT_AMBULATORY_CARE_PROVIDER_SITE_OTHER): Payer: PPO | Admitting: Pharmacist

## 2020-12-09 DIAGNOSIS — I10 Essential (primary) hypertension: Secondary | ICD-10-CM

## 2020-12-09 DIAGNOSIS — E785 Hyperlipidemia, unspecified: Secondary | ICD-10-CM

## 2020-12-09 NOTE — Progress Notes (Signed)
Chronic Care Management Pharmacy Note  12/18/2020 Name:  LINDSEY HOMMEL MRN:  [id number] DOB:  [birth year]  Summary: BP is not at goal < 140/90 LDL is at goal < 100    Recommendations/Changes made from today's visit: -Recommend bone density scan per recommendations from endocrinology -Recommended purchasing a BP cuff and routine BP monitoring at home -Recommended increasing fish oil to 1000 mg twice daily to increase HDL   Plan: Patient will schedule labs and request order for DEXA from endocrinology  Subjective: SHER HELLINGER is an 70 y.o. year old male who is a primary patient of [name], MD.  The CCM team was consulted for assistance with disease management and care coordination needs.    Engaged with patient by telephone for follow up visit in response to provider referral for pharmacy case management and/or care coordination services.   Consent to Services:  The patient was given information about Chronic Care Management services, agreed to services, and gave verbal consent prior to initiation of services.  Please see initial visit note for detailed documentation.   Patient Care Team: [name], MD as PCP - Desma Paganini, MD as PCP - Cardiology (Cardiology) Viona Gilmore, [facility] as Pharmacist (Pharmacist)  Recent office visits: 07/10/20 [name], MD: patient seen for acute visit for a UTI.  06/29/20 [name], MD patient present for wellness exam. Prescribed Ketoconazole 2 % 1 application Topical 2 times daily PRN.   Recent consult visits: 11/11/20 Lisette Abu, RN (cardiology): Patient presented for anti-coag visit. INR 2.4, goal 2-3. Continue 7.5 mg every day.  09/29/20 Lucio Edward, MD (gastroenterology): Patient presented for colonoscopy. Recommended 7 year follow up colonoscopy.  09/23/20 Renato Shin, MD (endo): Patient presented for initial visit for hypercalcemia. Recommended decreasing HCTZ and DEXA scan.   07/14/20 Richardson Dopp, PA-C  (cardiology): Patient presented for Afib follow up. Ordered Zio monitor. Follow up in 6 months.  Hospital visits: None in previous 6 months  Objective:  Lab Results  Component Value Date   CREATININE 1.00 06/29/2020   BUN 28 (H) 06/29/2020   GFR 76.27 06/29/2020   GFRNONAA 63 04/17/2019   GFRAA 72 04/17/2019   NA 137 06/29/2020   K 4.5 06/29/2020   CALCIUM 11.0 (H) 07/10/2020   CO2 28 06/29/2020    Lab Results  Component Value Date/Time   HGBA1C 5.8 06/29/2020 09:59 AM   HGBA1C 5.8 07/20/2017 08:58 AM   GFR 76.27 06/29/2020 09:59 AM   GFR 73.15 07/20/2017 08:58 AM    Last diabetic Eye exam: No results found for: HMDIABEYEEXA  Last diabetic Foot exam: No results found for: HMDIABFOOTEX   Lab Results  Component Value Date   CHOL 140 06/29/2020   HDL 33.00 (L) 06/29/2020   LDLCALC 86 06/29/2020   LDLDIRECT 168.0 03/25/2014   TRIG 104.0 06/29/2020   CHOLHDL 4 06/29/2020    Hepatic Function Latest Ref Rng & Units 06/29/2020 07/20/2017 07/18/2016  Total Protein 6.0 - 8.3 g/dL 6.9 6.9 6.8  Albumin 3.5 - 5.2 g/dL 4.1 4.2 4.3  AST 0 - 37 U/L _0 ALT 0 - 53 U/L _1 Alk Phosphatase 39 - 117 U/L 108 95 104  Total Bilirubin 0.2 - 1.2 mg/dL 0.7 0.7 0.6  Bilirubin, Direct 0.0 - 0.3 mg/dL 0.1 0.2 0.1    Lab Results  Component Value Date/Time   TSH 1.59 06/29/2020 09:59 AM   TSH 1.560 03/29/2019 04:55 PM  FREET4 0.92 06/29/2020 09:59 AM    CBC Latest Ref Rng & Units 06/29/2020 03/29/2019 07/20/2017  WBC 4.0 - 10.5 K/uL 6.9 11.6(H) 8.9  Hemoglobin 13.0 - 17.0 g/dL 12.9(L) 13.7 14.2  Hematocrit 39.0 - 52.0 % 37.7(L) 40.7 40.4  Platelets 150.0 - 400.0 K/uL 214.0 237 200.0    Lab Results  Component Value Date/Time   VD25OH 20.47 (L) 10/14/2013 01:56 PM   VD25OH <10 (L) 07/18/2013 11:58 AM    Clinical ASCVD: No  The 10-year ASCVD risk score (Arnett DK, et al., 2019) is: 21.5%   Values used to calculate the score:     Age: 43 years     Sex: Male     Is  Non-Hispanic African American: No     Diabetic: No     Tobacco smoker: No     Systolic Blood Pressure: 122 mmHg     Is BP treated: Yes     HDL Cholesterol: 33 mg/dL     Total Cholesterol: 140 mg/dL    Depression screen [facility] 2/9 06/29/2020 07/21/2016  Decreased Interest 0 0  Down, Depressed, Hopeless 0 0  PHQ - 2 Score 0 0  Altered sleeping 2 -  Tired, decreased energy 0 -  Change in appetite 0 -  Feeling bad or failure about yourself  0 -  Trouble concentrating 0 -  Moving slowly or fidgety/restless 0 -  Suicidal thoughts 0 -  PHQ-9 Score 2 -  Difficult doing work/chores Not difficult at all -  Some recent data might be hidden     CHA2DS2/VAS Stroke Risk Points  Current as of about an hour ago     2 >= 2 Points: High Risk  1 - 1.99 Points: Medium Risk  0 Points: Low Risk    Last Change: N/A      Details    This score determines the patient's risk of having a stroke if the  patient has atrial fibrillation.       Points Metrics  0 Has Congestive Heart Failure:  No    Current as of about an hour ago  0 Has Vascular Disease:  No    Current as of about an hour ago  1 Has Hypertension:  Yes    Current as of about an hour ago  1 Age:  70    Current as of about an hour ago  0 Has Diabetes:  No    Current as of about an hour ago  0 Had Stroke:  No  Had TIA:  No  Had Thromboembolism:  No    Current as of about an hour ago  0 Male:  No    Current as of about an hour ago     Social History   Tobacco Use  Smoking Status Never  Smokeless Tobacco Never   BP Readings from Last 3 Encounters:  09/29/20 130/80  09/23/20 (!) 146/74  07/14/20 122/80   Pulse Readings from Last 3 Encounters:  09/29/20 (!) 56  09/23/20 66  09/16/20 69   Wt Readings from Last 3 Encounters:  09/29/20 271 lb (122.9 kg)  09/23/20 268 lb (121.6 kg)  09/16/20 271 lb 3.2 oz (123 kg)    Assessment/Interventions: Review of patient past medical history, allergies, medications, health status,  including review of consultants reports, laboratory and other test data, was performed as part of comprehensive evaluation and provision of chronic care management services.   SDOH:  (Social Determinants of Health) assessments and interventions performed:  Yes    CCM Care Plan  Allergies  Allergen Reactions   Statins    Penicillins     REACTION: unspecified    Medications Reviewed Today     Reviewed by Ladene Artist, MD (Physician) on 09/29/20 at [place] List Status: <None>   Medication Order Taking? Sig Documenting Provider Last Dose Status Informant  amLODipine (NORVASC) 10 MG tablet [id number] Yes Take 1 tablet (10 mg total) by mouth daily. [name], MD Taking Active   carvedilol (COREG) 12.5 MG tablet [id number] Yes Take 1.5 tablets (18.75 mg total) by mouth 2 (two) times daily. Richardson Dopp T, PA-C Taking Active   cloNIDine (CATAPRES) 0.2 MG tablet [id number] Yes Take 1 tablet (0.2 mg total) by mouth 2 (two) times daily. [name], MD Taking Active   doxazosin (CARDURA) 4 MG tablet [id number] Yes Take 1 tablet (4 mg total) by mouth daily. [name], MD Taking Active   ezetimibe (ZETIA) 10 MG tablet [id number] Yes Take 1 tablet (10 mg total) by mouth daily. [name], MD Taking Active   fish oil-omega-3 fatty acids 1000 MG capsule [id number] Yes Take 1 g by mouth daily. [provider] Taking Active Self  hydrochlorothiazide (MICROZIDE) 12.5 MG capsule [id number] Yes Take 1 capsule (12.5 mg total) by mouth daily. [name], MD Taking Active   ketoconazole (NIZORAL) 2 % cream [id number] Yes Apply 1 application topically 2 (two) times daily as needed for irritation. [name], MD Taking Active   losartan (COZAAR) 100 MG tablet [id number] Yes Take 1 tablet (100 mg total) by mouth daily. [name], MD Taking Active   lubiprostone [facility]) 24 MCG capsule [id number] Yes Take 1 capsule (24 mcg total) by mouth 2 (two) times daily. [name], MD  Taking Active   meloxicam [facility]) 15 MG tablet [id number] Yes Take 1 tablet (15 mg total) by mouth daily. [name], MD Taking Active   warfarin (COUMADIN) 5 MG tablet [id number] Yes TAKE 1 TO 1  AND 1/2 TABLET BY MOUTH AS DIRECTED BY THE COUMADIN CLINIC Fay Records, MD Taking Active             Patient Active Problem List   Diagnosis Date Noted   Hypercalcemia 09/26/2020   Permanent atrial fibrillation ([place])    Prediabetes 07/21/2016   Myalgia and myositis 10/17/2013   Right hip pain 10/17/2013   Unspecified vitamin D deficiency 10/11/2013   Encounter for therapeutic drug monitoring 06/03/2013   OTHER CONSTIPATION 01/20/2010   PERSONAL HX COLONIC POLYPS 01/20/2010   OBSTRUCTIVE SLEEP APNEA 11/17/2008   AORTIC STENOSIS/ INSUFFICIENCY, NON-RHEUMATIC 08/20/2008   ATRIAL FIBRILLATION 08/18/2008   HYPOKALEMIA, HX OF 08/18/2008   PLANTAR FASCIITIS 08/10/2007   Dyslipidemia 06/18/2007   OBESITY 06/18/2007   INTERNAL HEMORRHOIDS 06/18/2007   EXTERNAL HEMORRHOIDS 06/18/2007   Essential hypertension 09/04/2006   MITRAL REGURGITATION, MODERATE 09/04/2006   RENAL CALCULUS, HX OF 09/04/2006   ADENOMATOUS COLONIC POLYP 07/29/2003    Immunization History  Administered Date(s) Administered   PFIZER Comirnaty(Gray [place])Covid-19 Tri-Sucrose Vaccine 05/16/2019, 06/06/2019, 03/03/2020   Patient drinks lots of water and diet lemonade and no soda and tea.  Conditions to be addressed/monitored:  Hypertension, Hyperlipidemia, Diabetes, Atrial Fibrillation and constipation, vitamin D deficiency, right hip pain  Conditions addressed this visit: Hypertension, Atrial fibrillation, Hyperlipidemia  Care Plan : [place]  Updates made by Viona Gilmore, [place] since 12/18/2020 12:00 AM  Problem: Problem: Hypertension, Hyperlipidemia, Diabetes, Atrial Fibrillation and constipation, vitamin D deficiency, right hip pain      Long-Range Goal: Patient-Specific Goal   Start  Date: 05/08/2020  Expected End Date: 05/08/2021  Recent Progress: On track  Priority: High  Note:   Current Barriers:  Unable to independently monitor therapeutic efficacy Unable to maintain control of cholesterol  Pharmacist Clinical Goal(s):  patient will achieve control of cholesterol as evidenced by next lipid panel  through collaboration with PharmD and provider.   Interventions: 1:1 collaboration with [name], MD regarding development and update of comprehensive plan of care as evidenced by provider attestation and co-signature Inter-disciplinary care team collaboration (see longitudinal plan of care) Comprehensive medication review performed; medication list updated in electronic medical record  Hypertension (BP goal <140/90) -Controlled -Current treatment: amlodipine 10 mg, 1 tablet daily  carvedilol 12.5 mg, 1.5 tablet twice daily   clonidine 0.2 mg, 1 tablet twice daily  doxazosin 4 mg, 1 tablet once daily HCTZ 12.5 mg, 1 capsule daily  losartan 100 mg, 1 tablet daily  -Medications previously tried: none  -Current home readings: does not check at home  (office readings 140/72, 146/74) -Current dietary habits: patient mentions he has reduced meat intake and eats plant based chicken, patties; he is working on reducing his sodium -Current exercise habits: exercise is limited due to bad right hip and bad left knee and is using a walker -Denies hypotensive/hypertensive symptoms -Educated on Exercise goal of 150 minutes per week; Importance of home blood pressure monitoring; -Counseled to monitor BP at home weekly, document, and provide log at future appointments -Recommended to continue current medication Patient agreed to look for a large BP cuff and start checking blood pressure at home.  Hyperlipidemia: (LDL goal < 100) -Controlled -Current treatment: ezetimibe 9m, 1 tablet daily omega-3 fatty acids 10044m 1 capsule once daily  -Medications previously tried:   atorvastatin, simvastatin (myalgias)  -Current dietary patterns: did not discuss -Current exercise habits: limited due to pain -Educated on Cholesterol goals;  Importance of limiting foods high in cholesterol; Exercise goal of 150 minutes per week; -Counseled on diet and exercise extensively Recommended to continue current medication Recommended to increase fish oil to 1000 mg twice daily.  Pre-diabetes (A1c goal <6.5%) -Controlled -Current medications: No medications -Medications previously tried: none  -Current home glucose readings fasting glucose: does not check post prandial glucose: does not check -Denies hypoglycemic/hyperglycemic symptoms -Current meal patterns:  breakfast: n/a lunch: n/a  dinner: n/a snacks: n/a drinks: n/a -Current exercise: n/a -Educated on Exercise goal of 150 minutes per week; Carbohydrate counting and/or plate method -Counseled to check feet daily and get yearly eye exams -Counseled on diet and exercise extensively Recommended repeat A1c check  Atrial Fibrillation (Goal: prevent stroke and major bleeding) -Controlled -CHADSVASC: 2 -Current treatment: Rate control: carvedilol 12.5 mg, 1.5 tablet twice daily Anticoagulation: warfarin 7.5 mg once daily -Medications previously tried: none -Home BP and HR readings: does not check  -Counseled on bleeding risk associated with warfarin and importance of self-monitoring for signs/symptoms of bleeding; importance of regular laboratory monitoring; -Recommended to continue current medication Patient has some salads and broccoli but keeps green intake consistent  Vitamin D deficiency (Goal: vitamin D 30-100) -Not ideally controlled -Current treatment  None -Medications previously tried: none  -Recommended to continue current medication  Constipation (Goal: minimize symptoms and regulate bowel movements) -Controlled -Current treatment  Amitiza 24 mcg, 1 capsule twice daily -Medications  previously tried:  biscodyl, docusate, other  medications -Recommended to continue current medication Recommended for patient to add more fiber to his diet  Hip pain (Goal: minimize pain) -Controlled -Current treatment  meloxicam 66m, 1 tablet daily  -Medications previously tried: naproxen, hydrocodone/ APAP -Counseled on bleed risk with NSAIDs and warfarin   Health Maintenance -Vaccine gaps: shingles, tetanus, influenza, pneumonia -Current therapy:  None -Educated on Cost vs benefit of each product must be carefully weighed by individual consumer -Patient is satisfied with current therapy and denies issues - Continue as is  Patient Goals/Self-Care Activities patient will:  - take medications as prescribed check blood pressure weekly, document, and provide at future appointments  Follow Up Plan: Telephone follow up appointment with care management team member scheduled for: 6 months       Medication Assistance: None required.  Patient affirms current coverage meets needs.  Compliance/Adherence/Medication fill history: Care Gaps: Influenza vaccine, tetanus, shingrix, COVID booster, Hep C screening, colonoscopy Last BP: 140/72 (07/10/20)   Star-Rating Drugs: Losartan 100 mg - [facility]/2020 [id number] [id number]: [phone]: [phone] Uses pill box? No - separates pill bottles on bathroom counter based on dosing times. (morning vs BID vs PM).  Pt endorses 100% compliance  We discussed: Benefits of medication synchronization, packaging and delivery as well as enhanced pharmacist oversight with [employer]. Patient decided to: Continue current medication management strategy  Care Plan and Follow Up Patient Decision:  Patient agrees to Care Plan and Follow-up.  Plan: Telephone follow up appointment with  care management team member scheduled for:  6 months  MJeni Salles PharmD BEast RockinghamPharmacist LStewartat BLa [phone]

## 2020-12-18 NOTE — Patient Instructions (Signed)
[employer],  It was great to catch up with you again! As we discussed, go ahead and increase your fish oil to twice daily to increase your good cholesterol (HDL) a little bit. Also, try your best to find a large blood pressure cuff and start checking your blood pressure at home.  Please reach out to me if you have any questions or need anything before our follow up!  Best, Maddie  Jeni Salles, PharmD, [place] [facility]   Visit Information   Goals Addressed   None    Patient Care Plan: CCM Pharmacy Care Plan     Problem Identified: Problem: Hypertension, Hyperlipidemia, Diabetes, Atrial Fibrillation and constipation, vitamin D deficiency, right hip pain      Long-Range Goal: Patient-Specific Goal   Start Date: 05/08/2020  Expected End Date: 05/08/2021  Recent Progress: On track  Priority: High  Note:   Current Barriers:  Unable to independently monitor therapeutic efficacy Unable to maintain control of cholesterol  Pharmacist Clinical Goal(s):  patient will achieve control of cholesterol as evidenced by next lipid panel  through collaboration with PharmD and provider.   Interventions: 1:1 collaboration with [name], MD regarding development and update of comprehensive plan of care as evidenced by provider attestation and co-signature Inter-disciplinary care team collaboration (see longitudinal plan of care) Comprehensive medication review performed; medication list updated in electronic medical record  Hypertension (BP goal <140/90) -Controlled -Current treatment: amlodipine 10 mg, 1 tablet daily  carvedilol 12.5 mg, 1.5 tablet twice daily   clonidine 0.2 mg, 1 tablet twice daily  doxazosin 4 mg, 1 tablet once daily HCTZ 12.5 mg, 1 capsule daily  losartan 100 mg, 1 tablet daily  -Medications previously tried: none  -Current home readings: does not check at home  (office readings 140/72, 146/74) -Current  dietary habits: patient mentions he has reduced meat intake and eats plant based chicken, patties; he is working on reducing his sodium -Current exercise habits: exercise is limited due to bad right hip and bad left knee and is using a walker -Denies hypotensive/hypertensive symptoms -Educated on Exercise goal of 150 minutes per week; Importance of home blood pressure monitoring; -Counseled to monitor BP at home weekly, document, and provide log at future appointments -Recommended to continue current medication Patient agreed to look for a large BP cuff and start checking blood pressure at home.  Hyperlipidemia: (LDL goal < 100) -Controlled -Current treatment: ezetimibe 10mg , 1 tablet daily omega-3 fatty acids 1000mg , 1 capsule once daily  -Medications previously tried:  atorvastatin, simvastatin (myalgias)  -Current dietary patterns: did not discuss -Current exercise habits: limited due to pain -Educated on Cholesterol goals;  Importance of limiting foods high in cholesterol; Exercise goal of 150 minutes per week; -Counseled on diet and exercise extensively Recommended to continue current medication Recommended to increase fish oil to 1000 mg twice daily.  Pre-diabetes (A1c goal <6.5%) -Controlled -Current medications: No medications -Medications previously tried: none  -Current home glucose readings fasting glucose: does not check post prandial glucose: does not check -Denies hypoglycemic/hyperglycemic symptoms -Current meal patterns:  breakfast: n/a lunch: n/a  dinner: n/a snacks: n/a drinks: n/a -Current exercise: n/a -Educated on Exercise goal of 150 minutes per week; Carbohydrate counting and/or plate method -Counseled to check feet daily and get yearly eye exams -Counseled on diet and exercise extensively Recommended repeat A1c check  Atrial Fibrillation (Goal: prevent stroke and major bleeding) -Controlled -CHADSVASC: 2 -Current treatment: Rate control:  carvedilol 12.5 mg,  1.5 tablet twice daily Anticoagulation: warfarin 7.5 mg once daily -Medications previously tried: none -Home BP and HR readings: does not check  -Counseled on bleeding risk associated with warfarin and importance of self-monitoring for signs/symptoms of bleeding; importance of regular laboratory monitoring; -Recommended to continue current medication Patient has some salads and broccoli but keeps green intake consistent  Vitamin D deficiency (Goal: vitamin D 30-100) -Not ideally controlled -Current treatment  None -Medications previously tried: none  -Recommended to continue current medication  Constipation (Goal: minimize symptoms and regulate bowel movements) -Controlled -Current treatment  Amitiza 24 mcg, 1 capsule twice daily -Medications previously tried:  biscodyl, docusate, other medications -Recommended to continue current medication Recommended for patient to add more fiber to his diet  Hip pain (Goal: minimize pain) -Controlled -Current treatment  meloxicam 15mg , 1 tablet daily  -Medications previously tried: naproxen, hydrocodone/ APAP -Counseled on bleed risk with NSAIDs and warfarin   Health Maintenance -Vaccine gaps: shingles, tetanus, influenza, pneumonia -Current therapy:  None -Educated on Cost vs benefit of each product must be carefully weighed by individual consumer -Patient is satisfied with current therapy and denies issues - Continue as is  Patient Goals/Self-Care Activities patient will:  - take medications as prescribed check blood pressure weekly, document, and provide at future appointments  Follow Up Plan: Telephone follow up appointment with care management team member scheduled for: 6 months       Patient verbalizes understanding of instructions provided today and agrees to view in [place].  Telephone follow up appointment with pharmacy team member scheduled for: 6 months  Viona Gilmore, [facility]

## 2020-12-27 ENCOUNTER — Other Ambulatory Visit: Payer: Self-pay | Admitting: Internal Medicine

## 2021-01-04 DIAGNOSIS — I10 Essential (primary) hypertension: Secondary | ICD-10-CM

## 2021-01-04 DIAGNOSIS — E785 Hyperlipidemia, unspecified: Secondary | ICD-10-CM | POA: Diagnosis not present

## 2021-01-06 ENCOUNTER — Ambulatory Visit (INDEPENDENT_AMBULATORY_CARE_PROVIDER_SITE_OTHER): Payer: PPO

## 2021-01-06 ENCOUNTER — Other Ambulatory Visit: Payer: Self-pay

## 2021-01-06 DIAGNOSIS — Z5181 Encounter for therapeutic drug level monitoring: Secondary | ICD-10-CM | POA: Diagnosis not present

## 2021-01-06 DIAGNOSIS — I4891 Unspecified atrial fibrillation: Secondary | ICD-10-CM | POA: Diagnosis not present

## 2021-01-06 LAB — POCT INR: INR: 2.6 (ref 2.0–3.0)

## 2021-01-06 NOTE — Patient Instructions (Signed)
Description   Continue taking Warfarin 1.5 tablets (7.5mg) daily. Call us with any new medications scheduled for any procedures or any questions. Recheck in 8 weeks. Phone [phone] [facility] Main # [phone] fax [phone]     

## 2021-01-14 DIAGNOSIS — I350 Nonrheumatic aortic (valve) stenosis: Secondary | ICD-10-CM

## 2021-01-14 DIAGNOSIS — I340 Nonrheumatic mitral (valve) insufficiency: Secondary | ICD-10-CM

## 2021-01-14 DIAGNOSIS — R001 Bradycardia, unspecified: Secondary | ICD-10-CM | POA: Insufficient documentation

## 2021-01-14 HISTORY — DX: Nonrheumatic aortic (valve) stenosis: I35.0

## 2021-01-14 HISTORY — DX: Nonrheumatic mitral (valve) insufficiency: I34.0

## 2021-01-14 NOTE — Progress Notes (Signed)
Cardiology Office Note:    Date:  01/15/2021   ID:  [name], DOB [birth year], MRN [id number]  PCP:  [name], MD   [facility] Providers Cardiologist:  [name], MD    Referring MD: [name], MD   Chief Complaint:  Follow-up for mitral regurgitation    Patient Profile:   [name] is a 70 y.o. male with:  Permanent atrial fibrillation  Bradycardia Monitor 6/22: longest pause 3.9 sec >> beta-blocker reduced  Mitral regurgitation (trivial on Echocardiogram in 2018) Echocardiogram 9/22: mild MR (eccentric)  Mild aortic stenosis (mean 21 mmHg on Echocardiogram in 2018) Hypertension  Hyperlipidemia  OSA  Obesity   History of Present Illness: [name] was last seen in 5/22.  I had him wear a monitor for bradycardia.  He has some pauses and the longest was 3.9 sec.  I reduced his carvedilol.  A f/u echocardiogram showed normal EF and mild MR.  The jet was eccentric and may be underestimated.  I reviewed this with Dr. Harrington Challenger and she has recommended that we proceed with a TEE to better evaluate the MR.  He returns for f/u.  He is here today with his son.  He again arrives in wheelchair.  He is fairly sedentary due to his back and hip.  He does walk with a walker at home.  He has not had any change in his symptoms.  He has not had chest pain, shortness of breath, PND.  He has chronic leg edema without change.  He has not had syncope.      ASSESSMENT & PLAN:   Nonrheumatic mitral valve regurgitation As noted, most recent echocardiogram demonstrated mild mitral regurgitation.  However, his MR was eccentric and apical views and could be underestimated.  I discussed this with the patient but he has not excited about pursuing transesophageal echocardiogram.  I reviewed his case again with Dr. Harrington Challenger today in the office.  She again reviewed his echocardiogram.  It is difficult to assess his symptoms as he is fairly sedentary related to his orthopedic issues.  It may be difficult  to know that he is having worsening shortness of breath.  Overall, he seems to be stable.  Intervention for his valve will be complex.  If he requires surgery, we will have to refer him to a center with a mitral valve surgeon.  He would be high risk given his comorbid illnesses and obesity.  After further discussion, we decided to hold off on pursuing a transesophageal echocardiogram at this time.  A repeat echo will be obtained in August 2023.  I will have the patient see Dr. Harrington Challenger after this.  Nonrheumatic aortic valve stenosis Mild aortic stenosis by echocardiogram in September 2022.  As noted, repeat echocardiogram will be obtained in August 2023.  Essential hypertension Blood pressure is well controlled.  Continue amlodipine 10 mg daily, carvedilol 18.75 mg twice daily, clonidine 0.2 mg twice daily, doxazosin 4 mg daily, HCTZ 12.5 mg daily, losartan 100 mg daily.  Permanent atrial fibrillation (HCC) He is tolerating anticoagulation.  His warfarin is managed in our Coumadin clinic.  Hemoglobin in April 2022 was normal.  Continue carvedilol 18.75 mg twice daily, warfarin as directed.  Bradycardia Heart rate has improved on reduced dose of beta-blocker.  He has not had symptoms of syncope or near syncope.  Continue carvedilol 18.75 mg twice daily. 1. Permanent atrial fibrillation ([place]) 2. Bradycardia 3. Long term current use of anticoagulant Rate is somewhat  slow today.  He is asymptomatic.  His blood pressure is well controlled on his current medical therapy.  I will arrange for a 3-day ZIO to assess heart rate control and rule out significant pauses.  If needed, I will decrease his carvedilol.  Recent hemoglobin, creatinine normal.  Coumadin is managed in our Coumadin clinic.  Follow-up in 6 months.   4. Essential hypertension The patient's blood pressure is controlled on his current regimen.  Continue current therapy.    5. Nonrheumatic aortic valve stenosis 6. Nonrheumatic mitral valve  regurgitation Mitral regurgitation has been difficult to assess in the past.  Last echocardiogram demonstrated mild aortic stenosis with mean gradient 21.  It has been 4 years since his last echocardiogram.  Arrange follow-up 2D echocardiogram.   7. Dyslipidemia He has been intolerant of statins in the past.  He is currently on ezetimibe.  Most recent LDL was 86.  This is fair control.  He is not interested in pursuing alternative therapies.  If his LDL should increase further, we can revisit whether or not to pursue PCSK9 inhibitor therapy versus bempedoic acid versus Inclisiran.           Dispo:  Return in about 9 months (around 10/15/2021) for Routine follow up in 9 months with Dr. Harrington Challenger..    Prior CV studies: Echocardiogram 12/02/20 Mild AS (mean 16 mmHg), mild MR (eccentric in apical views and may be underestimated>>TEE if clinically indicated), EF 60-65, no RWMA, mild LVH, normal RVSF, mod LAE, mild RAE, small pericardial effusion, trivial AI  Monitor 08/06/20 AFib, avg HR 55 Few pauses; longest 3.9 sec  Occ PVCs; short burst of NSVT (4 beats)  VAS US CAROTID DUPLEX BILATERAL 04/05/2019 Bilat ICA 1-39   Echo 08/19/16 Mild LVH, EF 65-70, mild AS (mean 21, peak 41), trivial MR, mild LAE, small eff   Echocardiogram 02/26/2014 Moderate LVH, EF 55-60, calcified aortic valve (mean 15), moderate LAE, trivial effusion     Past Medical History:  Diagnosis Date   Adenomatous colon polyp 07/2003   Aortic stenosis    Echo 9/22: EF 60-65, no RWMA, mild LVH, normal RVSF, moderate LAE, mild RAE, small pericardial effusion, mild aortic stenosis (mean gradient 16 mmHg), trivial AI, mild MR (MR is eccentric in apical views and may be underestimated)   Heart valve problem    Dr [name]   Hypertension    Mitral valve disorders(424.0)    Obesity    OSA (obstructive sleep apnea)    cpap   Other and unspecified hyperlipidemia    Permanent atrial fibrillation ([place])    Zio Monitor 5/22: AFib -  Avg HR 55; slowest 32; longest pause 3.9 sec; occ PVCs, 4 beat NSVT   Sleep apnea    Current Medications: Current Meds  Medication Sig   amLODipine (NORVASC) 10 MG tablet Take 1 tablet (10 mg total) by mouth daily.   carvedilol (COREG) 12.5 MG tablet Take 1.5 tablets (18.75 mg total) by mouth 2 (two) times daily.   cloNIDine (CATAPRES) 0.2 MG tablet Take 1 tablet (0.2 mg total) by mouth 2 (two) times daily.   doxazosin (CARDURA) 4 MG tablet Take 1 tablet (4 mg total) by mouth daily.   ezetimibe (ZETIA) 10 MG tablet Take 1 tablet (10 mg total) by mouth daily.   fish oil-omega-3 fatty acids 1000 MG capsule Take 1 g by mouth daily.   hydrochlorothiazide (MICROZIDE) 12.5 MG capsule Take 1 capsule (12.5 mg total) by mouth daily.   ketoconazole (NIZORAL)  2 % cream Apply 1 application topically 2 (two) times daily as needed for irritation.   losartan (COZAAR) 100 MG tablet Take 1 tablet (100 mg total) by mouth daily.   lubiprostone (AMITIZA) 24 MCG capsule Take 1 capsule (24 mcg total) by mouth 2 (two) times daily.   meloxicam (MOBIC) 15 MG tablet Take 1 tablet (15 mg total) by mouth daily.   warfarin (COUMADIN) 5 MG tablet TAKE 1 TO 1 AND 1/2 TABLETS BY MOUTH AS DIRECTED BY THE COUMADIN CLINIC    Allergies:   Statins and Penicillins   Social History   Tobacco Use   Smoking status: Never   Smokeless tobacco: Never  Vaping Use   Vaping Use: Never used  Substance Use Topics   Alcohol use: No    Alcohol/week: 0.0 standard drinks   Drug use: No    Family Hx: The patient's family history includes Alzheimer's disease in his maternal aunt and mother; Heart disease in his father; Hypertension in his father; Stroke in his father. There is no history of Colon cancer or Hypercalcemia.  Review of Systems  Gastrointestinal:  Negative for hematochezia.  Genitourinary:  Negative for hematuria.    EKGs/Labs/Other Test Reviewed:    EKG:  EKG is not ordered today.  The ekg ordered today demonstrates  n/a  Recent Labs: 06/29/2020: ALT 19; BUN 28; Creatinine, Ser 1.00; Hemoglobin 12.9; Platelets 214.0; Potassium 4.5; Sodium 137; TSH 1.59   Recent Lipid Panel Lab Results  Component Value Date/Time   CHOL 140 06/29/2020 09:59 AM   CHOL 163 03/29/2019 04:55 PM   TRIG 104.0 06/29/2020 09:59 AM   HDL 33.00 (L) 06/29/2020 09:59 AM   HDL 40 03/29/2019 04:55 PM   LDLCALC 86 06/29/2020 09:59 AM   LDLCALC 103 (H) 03/29/2019 04:55 PM   LDLDIRECT 168.0 03/25/2014 08:44 AM     Risk Assessment/Calculations:    CHA2DS2-VASc Score = 2   This indicates a 2.2% annual risk of stroke. The patient's score is based upon: CHF History: 0 HTN History: 1 Diabetes History: 0 Stroke History: 0 Vascular Disease History: 0 Age Score: 1 Gender Score: 0         Physical Exam:    VS:  BP 122/60   Pulse 60   Ht 5\' 10"  (1.778 m)   Wt 266 lb 12.5 oz (121 kg)   SpO2 98%   BMI 38.28 kg/m     Wt Readings from Last 3 Encounters:  01/15/21 266 lb 12.5 oz (121 kg)  09/29/20 271 lb (122.9 kg)  09/23/20 268 lb (121.6 kg)    Constitutional:      Appearance: Healthy appearance. Not in distress.  Pulmonary:     Breath sounds: No wheezing. No rales.  Cardiovascular:     Normal rate. Irregularly irregular rhythm. Normal S1. Normal S2.      Murmurs: There is a grade 2/6 systolic murmur at the LLSB.  Edema:    Peripheral edema present.    Pretibial: bilateral 1+ edema of the pretibial area. Abdominal:     Palpations: Abdomen is soft.  Musculoskeletal:     Cervical back: Neck supple. Skin:    General: Skin is warm and dry.  Neurological:     General: No focal deficit present.     Mental Status: Alert and oriented to person, place and time.       Medication Adjustments/Labs and Tests Ordered: Current medicines are reviewed at length with the patient today.  Concerns regarding medicines are outlined  above.  Tests Ordered: Orders Placed This Encounter  Procedures   ECHOCARDIOGRAM COMPLETE    Medication Changes: No orders of the defined types were placed in this encounter.  Signed, Richardson Dopp, PA-C  01/15/2021 1:58 PM    [facility] [place], [place], [place]  [address] Phone: [phone]; Fax: [phone]

## 2021-01-15 ENCOUNTER — Other Ambulatory Visit: Payer: Self-pay

## 2021-01-15 ENCOUNTER — Encounter: Payer: Self-pay | Admitting: Physician Assistant

## 2021-01-15 ENCOUNTER — Ambulatory Visit: Payer: PPO | Admitting: Physician Assistant

## 2021-01-15 VITALS — BP 122/60 | HR 60 | Ht 70.0 in | Wt 266.8 lb

## 2021-01-15 DIAGNOSIS — I350 Nonrheumatic aortic (valve) stenosis: Secondary | ICD-10-CM

## 2021-01-15 DIAGNOSIS — R001 Bradycardia, unspecified: Secondary | ICD-10-CM | POA: Diagnosis not present

## 2021-01-15 DIAGNOSIS — I340 Nonrheumatic mitral (valve) insufficiency: Secondary | ICD-10-CM | POA: Diagnosis not present

## 2021-01-15 DIAGNOSIS — I10 Essential (primary) hypertension: Secondary | ICD-10-CM | POA: Diagnosis not present

## 2021-01-15 DIAGNOSIS — I4821 Permanent atrial fibrillation: Secondary | ICD-10-CM | POA: Diagnosis not present

## 2021-01-15 NOTE — Assessment & Plan Note (Signed)
He is tolerating anticoagulation.  His warfarin is managed in our Coumadin clinic.  Hemoglobin in April 2022 was normal.  Continue carvedilol 18.75 mg twice daily, warfarin as directed.

## 2021-01-15 NOTE — Assessment & Plan Note (Signed)
Heart rate has improved on reduced dose of beta-blocker.  He has not had symptoms of syncope or near syncope.  Continue carvedilol 18.75 mg twice daily.

## 2021-01-15 NOTE — Assessment & Plan Note (Signed)
Blood pressure is well controlled.  Continue amlodipine 10 mg daily, carvedilol 18.75 mg twice daily, clonidine 0.2 mg twice daily, doxazosin 4 mg daily, HCTZ 12.5 mg daily, losartan 100 mg daily.

## 2021-01-15 NOTE — Patient Instructions (Signed)
Medication Instructions:   Your physician recommends that you continue on your current medications as directed. Please refer to the Current Medication list given to you today.  *If you need a refill on your cardiac medications before your next appointment, please call your pharmacy*   Lab Work:  -NONE  If you have labs (blood work) drawn today and your tests are completely normal, you will receive your results only by: [place] (if you have MyChart) OR A paper copy in the mail If you have any lab test that is abnormal or we need to change your treatment, we will call you to review the results.   Testing/Procedures:  Your physician has requested that you have an echocardiogram. Echocardiography is a painless test that uses sound waves to create images of your heart. It provides your doctor with information about the size and shape of your heart and how well your heart's chambers and valves are working. This procedure takes approximately one hour. There are no restrictions for this procedure.    Follow-Up: At [facility], you and your health needs are our priority.  As part of our continuing mission to provide you with exceptional heart care, we have created designated Provider Care Teams.  These Care Teams include your primary Cardiologist (physician) and Advanced Practice Providers (APPs -  Physician Assistants and Nurse Practitioners) who all work together to provide you with the care you need, when you need it.  We recommend signing up for the patient portal called "MyChart".  Sign up information is provided on this After Visit Summary.  MyChart is used to connect with patients for Virtual Visits (Telemedicine).  Patients are able to view lab/test results, encounter notes, upcoming appointments, etc.  Non-urgent messages can be sent to your provider as well.   To learn more about what you can do with MyChart, go to [email or web address].    Your next appointment:   9  month(s)  The format for your next appointment:   In Person  Provider:   [name], MD     Other Instructions Your physician wants you to follow-up in: 9 months with Dr. Harrington Challenger.  You will receive a reminder letter in the mail two months in advance. If you don't receive a letter, please call our office to schedule the follow-up appointment.

## 2021-01-15 NOTE — Assessment & Plan Note (Signed)
Mild aortic stenosis by echocardiogram in September 2022.  As noted, repeat echocardiogram will be obtained in August 2023.

## 2021-01-15 NOTE — Assessment & Plan Note (Signed)
As noted, most recent echocardiogram demonstrated mild mitral regurgitation.  However, his MR was eccentric and apical views and could be underestimated.  I discussed this with the patient but he has not excited about pursuing transesophageal echocardiogram.  I reviewed his case again with Dr. Harrington Challenger today in the office.  She again reviewed his echocardiogram.  It is difficult to assess his symptoms as he is fairly sedentary related to his orthopedic issues.  It may be difficult to know that he is having worsening shortness of breath.  Overall, he seems to be stable.  Intervention for his valve will be complex.  If he requires surgery, we will have to refer him to a center with a mitral valve surgeon.  He would be high risk given his comorbid illnesses and obesity.  After further discussion, we decided to hold off on pursuing a transesophageal echocardiogram at this time.  A repeat echo will be obtained in August 2023.  I will have the patient see Dr. Harrington Challenger after this.

## 2021-01-25 ENCOUNTER — Other Ambulatory Visit: Payer: Self-pay | Admitting: Physician Assistant

## 2021-03-03 ENCOUNTER — Other Ambulatory Visit: Payer: Self-pay

## 2021-03-03 ENCOUNTER — Ambulatory Visit (INDEPENDENT_AMBULATORY_CARE_PROVIDER_SITE_OTHER): Payer: PPO

## 2021-03-03 DIAGNOSIS — Z5181 Encounter for therapeutic drug level monitoring: Secondary | ICD-10-CM

## 2021-03-03 DIAGNOSIS — I4891 Unspecified atrial fibrillation: Secondary | ICD-10-CM

## 2021-03-03 LAB — POCT INR: INR: 2.9 (ref 2.0–3.0)

## 2021-03-03 NOTE — Patient Instructions (Signed)
Description   Continue taking Warfarin 1.5 tablets (7.5mg) daily. Call us with any new medications scheduled for any procedures or any questions. Recheck in 8 weeks. Phone [phone] [facility] Main # [phone] fax [phone]     

## 2021-03-11 ENCOUNTER — Telehealth: Payer: Self-pay | Admitting: Family Medicine

## 2021-03-11 NOTE — Telephone Encounter (Signed)
Left message for patient to call back and schedule Medicare Annual Wellness Visit (AWV) either virtually or in office. Left  my [name] number [phone]   AWV-I per [place] 01/06/16  please schedule at anytime with [facility] Nurse Health Advisor 1 or 2   This should be a 45 minute visit.

## 2021-03-18 ENCOUNTER — Ambulatory Visit (INDEPENDENT_AMBULATORY_CARE_PROVIDER_SITE_OTHER): Payer: PPO

## 2021-03-18 VITALS — Ht 70.0 in | Wt 267.0 lb

## 2021-03-18 DIAGNOSIS — Z0000 Encounter for general adult medical examination without abnormal findings: Secondary | ICD-10-CM | POA: Diagnosis not present

## 2021-03-18 NOTE — Patient Instructions (Signed)
[name] , Thank you for taking time to come for your Medicare Wellness Visit. I appreciate your ongoing commitment to your health goals. Please review the following plan we discussed and let me know if I can assist you in the future.   These are the goals we discussed:  Goals   None     This is a list of the screening recommended for you and due dates:  Health Maintenance  Topic Date Due   COVID-19 Vaccine (4 - Booster for Pfizer series) 04/03/2021*   Flu Shot  06/04/2021*   Zoster (Shingles) Vaccine (1 of 2) 06/16/2021*   Pneumonia Vaccine (1 - PCV) 03/18/2022*   Colon Cancer Screening  03/18/2022*   Tetanus Vaccine  03/18/2022*   Hepatitis C Screening: USPSTF Recommendation to screen - Ages 18-79 yo.  03/18/2022*   HPV Vaccine  Aged Out  *Topic was postponed. The date shown is not the original due date.    Advanced directives: Yes  Conditions/risks identified: None  Next appointment: Follow up in one year for your annual wellness visit.   Preventive Care 31 Years and Older, Male Preventive care refers to lifestyle choices and visits with your health care provider that can promote health and wellness. What does preventive care include? A yearly physical exam. This is also called an annual well check. Dental exams once or twice a year. Routine eye exams. Ask your health care provider how often you should have your eyes checked. Personal lifestyle choices, including: Daily care of your teeth and gums. Regular physical activity. Eating a healthy diet. Avoiding tobacco and drug use. Limiting alcohol use. Practicing safe sex. Taking low doses of aspirin every day. Taking vitamin and mineral supplements as recommended by your health care provider. What happens during an annual well check? The services and screenings done by your health care provider during your annual well check will depend on your age, overall health, lifestyle risk factors, and family history of  disease. Counseling  Your health care provider may ask you questions about your: Alcohol use. Tobacco use. Drug use. Emotional well-being. Home and relationship well-being. Sexual activity. Eating habits. History of falls. Memory and ability to understand (cognition). Work and work Statistician. Screening  You may have the following tests or measurements: Height, weight, and BMI. Blood pressure. Lipid and cholesterol levels. These may be checked every 5 years, or more frequently if you are over 4 years old. Skin check. Lung cancer screening. You may have this screening every year starting at age 71 if you have a 30-pack-year history of smoking and currently smoke or have quit within the past 15 years. Fecal occult blood test (FOBT) of the stool. You may have this test every year starting at age 6. Flexible sigmoidoscopy or colonoscopy. You may have a sigmoidoscopy every 5 years or a colonoscopy every 10 years starting at age 26. Prostate cancer screening. Recommendations will vary depending on your family history and other risks. Hepatitis C blood test. Hepatitis B blood test. Sexually transmitted disease (STD) testing. Diabetes screening. This is done by checking your blood sugar (glucose) after you have not eaten for a while (fasting). You may have this done every 1-3 years. Abdominal aortic aneurysm (AAA) screening. You may need this if you are a current or former smoker. Osteoporosis. You may be screened starting at age 33 if you are at high risk. Talk with your health care provider about your test results, treatment options, and if necessary, the need for more tests.  Vaccines  Your health care provider may recommend certain vaccines, such as: Influenza vaccine. This is recommended every year. Tetanus, diphtheria, and acellular pertussis (Tdap, Td) vaccine. You may need a Td booster every 10 years. Zoster vaccine. You may need this after age 10. Pneumococcal 13-valent  conjugate (PCV13) vaccine. One dose is recommended after age 20. Pneumococcal polysaccharide (PPSV23) vaccine. One dose is recommended after age 61. Talk to your health care provider about which screenings and vaccines you need and how often you need them. This information is not intended to replace advice given to you by your health care provider. Make sure you discuss any questions you have with your health care provider. Document Released: 03/20/2015 Document Revised: 11/11/2015 Document Reviewed: 12/23/2014 Elsevier Interactive Patient Education  2017 [place] Prevention in the Home Falls can cause injuries. They can happen to people of all ages. There are many things you can do to make your home safe and to help prevent falls. What can I do on the outside of my home? Regularly fix the edges of walkways and driveways and fix any cracks. Remove anything that might make you trip as you walk through a door, such as a raised step or threshold. Trim any bushes or trees on the path to your home. Use bright outdoor lighting. Clear any walking paths of anything that might make someone trip, such as rocks or tools. Regularly check to see if handrails are loose or broken. Make sure that both sides of any steps have handrails. Any raised decks and porches should have guardrails on the edges. Have any leaves, snow, or ice cleared regularly. Use sand or salt on walking paths during winter. Clean up any spills in your garage right away. This includes oil or grease spills. What can I do in the bathroom? Use night lights. Install grab bars by the toilet and in the tub and shower. Do not use towel bars as grab bars. Use non-skid mats or decals in the tub or shower. If you need to sit down in the shower, use a plastic, non-slip stool. Keep the floor dry. Clean up any water that spills on the floor as soon as it happens. Remove soap buildup in the tub or shower regularly. Attach bath mats  securely with double-sided non-slip rug tape. Do not have throw rugs and other things on the floor that can make you trip. What can I do in the bedroom? Use night lights. Make sure that you have a light by your bed that is easy to reach. Do not use any sheets or blankets that are too big for your bed. They should not hang down onto the floor. Have a firm chair that has side arms. You can use this for support while you get dressed. Do not have throw rugs and other things on the floor that can make you trip. What can I do in the kitchen? Clean up any spills right away. Avoid walking on wet floors. Keep items that you use a lot in easy-to-reach places. If you need to reach something above you, use a strong step stool that has a grab bar. Keep electrical cords out of the way. Do not use floor polish or wax that makes floors slippery. If you must use wax, use non-skid floor wax. Do not have throw rugs and other things on the floor that can make you trip. What can I do with my stairs? Do not leave any items on the stairs. Make sure that  there are handrails on both sides of the stairs and use them. Fix handrails that are broken or loose. Make sure that handrails are as long as the stairways. Check any carpeting to make sure that it is firmly attached to the stairs. Fix any carpet that is loose or worn. Avoid having throw rugs at the top or bottom of the stairs. If you do have throw rugs, attach them to the floor with carpet tape. Make sure that you have a light switch at the top of the stairs and the bottom of the stairs. If you do not have them, ask someone to add them for you. What else can I do to help prevent falls? Wear shoes that: Do not have high heels. Have rubber bottoms. Are comfortable and fit you well. Are closed at the toe. Do not wear sandals. If you use a stepladder: Make sure that it is fully opened. Do not climb a closed stepladder. Make sure that both sides of the stepladder  are locked into place. Ask someone to hold it for you, if possible. Clearly mark and make sure that you can see: Any grab bars or handrails. First and last steps. Where the edge of each step is. Use tools that help you move around (mobility aids) if they are needed. These include: Canes. Walkers. Scooters. Crutches. Turn on the lights when you go into a dark area. Replace any light bulbs as soon as they burn out. Set up your furniture so you have a clear path. Avoid moving your furniture around. If any of your floors are uneven, fix them. If there are any pets around you, be aware of where they are. Review your medicines with your doctor. Some medicines can make you feel dizzy. This can increase your chance of falling. Ask your doctor what other things that you can do to help prevent falls. This information is not intended to replace advice given to you by your health care provider. Make sure you discuss any questions you have with your health care provider. Document Released: 12/18/2008 Document Revised: 07/30/2015 Document Reviewed: 03/28/2014 Elsevier Interactive Patient Education  2017 [employer].

## 2021-03-18 NOTE — Progress Notes (Signed)
Subjective:   [name] is a 71 y.o. male who presents for Medicare Annual/Subsequent preventive examination.  Review of Systems    No ROS Cardiac Risk Factors include: hypertension    Objective:    Today's Vitals   03/18/21 1518  Weight: 267 lb (121.1 kg)  Height: 5\' 10"  (1.778 m)   Body mass index is 38.31 kg/m.  Advanced Directives 03/18/2021 06/01/2015 11/21/2012  Does Patient Have a Medical Advance Directive? Yes Yes Patient has advance directive, copy not in chart  Type of Advance Directive [place];Living will [place];Living will [place];Living will  Does patient want to make changes to medical advance directive? No - Patient declined - -  Copy of [place] in Chart? No - copy requested - -    Current Medications (verified) Outpatient Encounter Medications as of 03/18/2021  Medication Sig   amLODipine (NORVASC) 10 MG tablet Take 1 tablet (10 mg total) by mouth daily.   carvedilol (COREG) 12.5 MG tablet TAKE 1 AND 1/2 TABLET BY MOUTH TWO TIMES A DAY   cloNIDine (CATAPRES) 0.2 MG tablet Take 1 tablet (0.2 mg total) by mouth 2 (two) times daily.   doxazosin (CARDURA) 4 MG tablet Take 1 tablet (4 mg total) by mouth daily.   ezetimibe (ZETIA) 10 MG tablet Take 1 tablet (10 mg total) by mouth daily.   fish oil-omega-3 fatty acids 1000 MG capsule Take 1 g by mouth daily.   hydrochlorothiazide (MICROZIDE) 12.5 MG capsule Take 1 capsule (12.5 mg total) by mouth daily.   ketoconazole (NIZORAL) 2 % cream Apply 1 application topically 2 (two) times daily as needed for irritation.   losartan (COZAAR) 100 MG tablet Take 1 tablet (100 mg total) by mouth daily.   lubiprostone (AMITIZA) 24 MCG capsule Take 1 capsule (24 mcg total) by mouth 2 (two) times daily.   meloxicam (MOBIC) 15 MG tablet Take 1 tablet (15 mg total) by mouth daily.   warfarin (COUMADIN) 5 MG tablet TAKE 1 TO 1 AND 1/2 TABLETS BY MOUTH  AS DIRECTED BY THE COUMADIN CLINIC   No facility-administered encounter medications on file as of 03/18/2021.    Allergies (verified) Statins and Penicillins   History: Past Medical History:  Diagnosis Date   Adenomatous colon polyp 07/2003   Aortic stenosis    Echo 9/22: EF 60-65, no RWMA, mild LVH, normal RVSF, moderate LAE, mild RAE, small pericardial effusion, mild aortic stenosis (mean gradient 16 mmHg), trivial AI, mild MR (MR is eccentric in apical views and may be underestimated)   Heart valve problem    Dr [name]   Hypertension    Mitral valve disorders(424.0)    Obesity    OSA (obstructive sleep apnea)    cpap   Other and unspecified hyperlipidemia    Permanent atrial fibrillation ([place])    Zio Monitor 5/22: AFib - Avg HR 55; slowest 32; longest pause 3.9 sec; occ PVCs, 4 beat NSVT   Sleep apnea    Past Surgical History:  Procedure Laterality Date   COLONOSCOPY  06-09-15   per Dr. Fuller Plan, adenomatous polyp, repeat in 5 yrs    FOOT SURGERY  at age 89   LUMBAR LAMINECTOMY/DECOMPRESSION MICRODISCECTOMY N/A 11/21/2012   Procedure: Lumbar Three to Lumbar Five Laminectomy;  Surgeon: Floyce Stakes, MD;  Location: [facility];  Service: Neurosurgery;  Laterality: N/A;  Lumbar Three to Lumbar Five Laminectomy   Family History  Problem  Relation Age of Onset   Alzheimer's disease Mother    Stroke Father    Heart disease Father    Hypertension Father    Alzheimer's disease Maternal Aunt        x 3   Colon cancer Neg Hx    Hypercalcemia Neg Hx    Social History   Socioeconomic History   Marital status: Single    Spouse name: Not on file   Number of children: 0   Years of education: Not on file   Highest education level: Not on file  Occupational History   Occupation: retired  Tobacco Use   Smoking status: Never   Smokeless tobacco: Never  Vaping Use   Vaping Use: Never used  Substance and Sexual Activity   Alcohol use: No    Alcohol/week: 0.0 standard  drinks   Drug use: No   Sexual activity: Not on file  Other Topics Concern   Not on file  Social History Narrative   Not on file   Social Determinants of Health   Financial Resource Strain: Low Risk    Difficulty of Paying Living Expenses: Not hard at all  Food Insecurity: No Food Insecurity   Worried About Charity fundraiser in the Last Year: Never true   [place] in the Last Year: Never true  Transportation Needs: No Transportation Needs   Lack of Transportation (Medical): No   Lack of Transportation (Non-Medical): No  Physical Activity: Inactive   Days of Exercise per Week: 0 days   Minutes of Exercise per Session: 0 min  Stress: No Stress Concern Present   Feeling of Stress : Not at all  Social Connections: Socially Isolated   Frequency of Communication with Friends and Family: Three times a week   Frequency of Social Gatherings with Friends and Family: Three times a week   Attends Religious Services: Never   Active Member of Clubs or Organizations: No   Attends Archivist Meetings: Never   Marital Status: Never married   Clinical Intake:  Pre-visit preparation completed: Yes Diabetic? No  Interpreter Needed?: No Activities of Daily Living In your present state of health, do you have any difficulty performing the following activities: 03/18/2021  Hearing? N  Vision? N  Difficulty concentrating or making decisions? N  Walking or climbing stairs? N  Dressing or bathing? N  Doing errands, shopping? N  Preparing Food and eating ? N  Using the Toilet? N  In the past six months, have you accidently leaked urine? N  Do you have problems with loss of bowel control? N  Managing your Medications? N  Managing your Finances? N  Housekeeping or managing your Housekeeping? N  Some recent data might be hidden    Patient Care Team: [name], MD as PCP - General Fay Records, MD as PCP - Cardiology (Cardiology) Viona Gilmore, [facility] as Pharmacist  (Pharmacist)  Indicate any recent Medical Services you may have received from other than [facility] providers in the past year (date may be approximate).     Assessment:   This is a routine wellness examination for [place]. Virtual Visit via Telephone Note  I connected with  [name] on 03/18/21 at  3:15 PM EST by telephone and verified that I am speaking with the correct person using two identifiers.  Location: Patient: Home Provider: Office Persons participating in the virtual visit: patient/Nurse Health Advisor   I discussed the limitations, risks, security and  privacy concerns of performing an evaluation and management service by telephone and the availability of in person appointments. The patient expressed understanding and agreed to proceed.  Interactive audio and video telecommunications were attempted between this nurse and patient, however failed, due to patient having technical difficulties OR patient did not have access to video capability.  We continued and completed visit with audio only.  Some vital signs may be absent or patient reported.   Criselda Peaches, LPN   Hearing/Vision screen Hearing Screening - Comments:: No difficulty hearing Vision Screening - Comments:: Wears glasses.   Dietary issues and exercise activities discussed: Current Exercise Habits: The patient does not participate in regular exercise at present   Goals Addressed   None    Depression Screen PHQ 2/9 Scores 03/18/2021 06/29/2020 07/21/2016  PHQ - 2 Score 0 0 0  PHQ- 9 Score - 2 -    Fall Risk Fall Risk  03/18/2021 06/29/2020 07/21/2016  Falls in the past year? 0 1 No  Number falls in past yr: 0 1 -  Injury with Fall? 0 0 -  Risk for fall due to : - Impaired mobility -  Risk for fall due to: Comment - Pt. in wheel chair -  Follow up - Falls evaluation completed -    FALL RISK PREVENTION PERTAINING TO THE HOME:  Any stairs in or around the home? No  If so, are there any without  handrails? No  Home free of loose throw rugs in walkways, pet beds, electrical cords, etc? Yes  Adequate lighting in your home to reduce risk of falls? Yes   ASSISTIVE DEVICES UTILIZED TO PREVENT FALLS:  Life alert? Yes  Use of a cane, walker or w/c? Yes  Grab bars in the bathroom? No  Shower chair or bench in shower? No  Elevated toilet seat or a handicapped toilet? Yes   TIMED UP AND GO:  Was the test performed? No . Audio Visit  Cognitive Function:     6CIT Screen 03/18/2021  What Year? 0 points  What month? 0 points  What time? 0 points  Count back from 20 0 points  Months in reverse 0 points  Repeat phrase 0 points  Total Score 0    Immunizations Immunization History  Administered Date(s) Administered   PFIZER Comirnaty(Gray Top)Covid-19 Tri-Sucrose Vaccine 05/16/2019, 06/06/2019, 03/03/2020    TDAP status: Due, Education has been provided regarding the importance of this vaccine. Advised may receive this vaccine at local pharmacy or Health Dept. Aware to provide a copy of the vaccination record if obtained from local pharmacy or Health Dept. Verbalized acceptance and understanding.  Flu Vaccine status: Due, Education has been provided regarding the importance of this vaccine. Advised may receive this vaccine at local pharmacy or Health Dept. Aware to provide a copy of the vaccination record if obtained from local pharmacy or Health Dept. Verbalized acceptance and understanding.  Pneumococcal vaccine status: Due, Education has been provided regarding the importance of this vaccine. Advised may receive this vaccine at local pharmacy or Health Dept. Aware to provide a copy of the vaccination record if obtained from local pharmacy or Health Dept. Verbalized acceptance and understanding.  Covid-19 vaccine status: Completed vaccines  Qualifies for Shingles Vaccine? Yes   Zostavax completed No   Shingrix Completed?: No.    Education has been provided regarding the  importance of this vaccine. Patient has been advised to call insurance company to determine out of pocket expense if they have not yet  received this vaccine. Advised may also receive vaccine at local pharmacy or Health Dept. Verbalized acceptance and understanding.  Screening Tests Health Maintenance  Topic Date Due   COVID-19 Vaccine (4 - Booster for Pfizer series) 04/03/2021 (Originally 04/28/2020)   INFLUENZA VACCINE  06/04/2021 (Originally 10/05/2020)   Zoster Vaccines- Shingrix (1 of 2) 06/16/2021 (Originally 01/07/1969)   Pneumonia Vaccine 18+ Years old (1 - PCV) 03/18/2022 (Originally 01/08/1957)   COLONOSCOPY (Pts 45-60yrs Insurance coverage will need to be confirmed)  03/18/2022 (Originally 06/08/2020)   TETANUS/TDAP  03/18/2022 (Originally 01/07/1969)   Hepatitis C Screening  03/18/2022 (Originally 01/08/1968)   HPV VACCINES  Aged Out    Health Maintenance  There are no preventive care reminders to display for this patient.   Additional Screening:  Hepatitis C Screening: does qualify; Completed No  Vision Screening: Recommended annual ophthalmology exams for early detection of glaucoma and other disorders of the eye. Is the patient up to date with their annual eye exam?  No   If pt is not established with a provider, would they like to be referred to a provider to establish care? No . Patient deferred  Dental Screening: Recommended annual dental exams for proper oral hygiene  Community Resource Referral / Chronic Care Management:  CRR required this visit?  No   CCM required this visit?  No      Plan:     I have personally reviewed and noted the following in the patients chart:   Medical and social history Use of alcohol, tobacco or illicit drugs  Current medications and supplements including opioid prescriptions. Patient is not currently taking opioid prescriptions. Functional ability and status Nutritional status Physical activity Advanced directives List of  other physicians Hospitalizations, surgeries, and ER visits in previous 12 months Vitals Screenings to include cognitive, depression, and falls Referrals and appointments  In addition, I have reviewed and discussed with patient certain preventive protocols, quality metrics, and best practice recommendations. A written personalized care plan for preventive services as well as general preventive health recommendations were provided to patient.     Criselda Peaches, LPN   [id number]

## 2021-03-26 ENCOUNTER — Telehealth: Payer: Self-pay | Admitting: Pharmacist

## 2021-03-26 NOTE — Chronic Care Management (AMB) (Signed)
Chronic Care Management Pharmacy Assistant   Name: [name]  MRN: [id number] DOB: Jul 14, 1950   Reason for Encounter: Disease State / Hypertension Assessment Call   Conditions to be addressed/monitored: HTN  Recent office visits:  03/18/2021 Rolene Arbour LPN - Medicare annual wellness exam  Recent consult visits:  01/15/2021 Richardson Dopp PA-C (cardiology) - Patient was seen for Permanent atrial fibrillation and additional issues. No medication changes.  Follow up in 9 months.   Hospital visits:  None  Medications: Outpatient Encounter Medications as of 03/26/2021  Medication Sig   amLODipine (NORVASC) 10 MG tablet Take 1 tablet (10 mg total) by mouth daily.   carvedilol (COREG) 12.5 MG tablet TAKE 1 AND 1/2 TABLET BY MOUTH TWO TIMES A DAY   cloNIDine (CATAPRES) 0.2 MG tablet Take 1 tablet (0.2 mg total) by mouth 2 (two) times daily.   doxazosin (CARDURA) 4 MG tablet Take 1 tablet (4 mg total) by mouth daily.   ezetimibe (ZETIA) 10 MG tablet Take 1 tablet (10 mg total) by mouth daily.   fish oil-omega-3 fatty acids 1000 MG capsule Take 1 g by mouth daily.   hydrochlorothiazide (MICROZIDE) 12.5 MG capsule Take 1 capsule (12.5 mg total) by mouth daily.   ketoconazole (NIZORAL) 2 % cream Apply 1 application topically 2 (two) times daily as needed for irritation.   losartan (COZAAR) 100 MG tablet Take 1 tablet (100 mg total) by mouth daily.   lubiprostone (AMITIZA) 24 MCG capsule Take 1 capsule (24 mcg total) by mouth 2 (two) times daily.   meloxicam (MOBIC) 15 MG tablet Take 1 tablet (15 mg total) by mouth daily.   warfarin (COUMADIN) 5 MG tablet TAKE 1 TO 1 AND 1/2 TABLETS BY MOUTH AS DIRECTED BY THE COUMADIN CLINIC   No facility-administered encounter medications on file as of 03/26/2021.  Fill History:  LOSARTAN 100 MG TABLET 01/13/2021 90   CARVEDILOL 12.5 MG TABLET 02/22/2021 30   EZETIMIBE 10 MG TABLET 01/13/2021 90   LUBIPROSTONE 24 MCG CAPSULE 01/13/2021 90    MELOXICAM 15 MG TABLET 02/27/2021 90   WARFARIN 5 MG TABLET 03/01/2021 33   AMLODIPINE 10 MG TABLET 01/13/2021 90   HYDROCHLOROTHIAZIDE 12.5 MG CAPSULE 01/13/2021 90   Reviewed chart prior to disease state call. Spoke with patient regarding BP  Recent Office Vitals: BP Readings from Last 3 Encounters:  01/15/21 122/60  09/29/20 130/80  09/23/20 (!) 146/74   Pulse Readings from Last 3 Encounters:  01/15/21 60  09/29/20 (!) 56  09/23/20 66    Wt Readings from Last 3 Encounters:  03/18/21 267 lb (121.1 kg)  01/15/21 266 lb 12.5 oz (121 kg)  09/29/20 271 lb (122.9 kg)     Kidney Function Lab Results  Component Value Date/Time   CREATININE 1.00 06/29/2020 09:59 AM   CREATININE 1.18 04/17/2019 12:00 AM   GFR 76.27 06/29/2020 09:59 AM   GFRNONAA 63 04/17/2019 12:00 AM   GFRAA 72 04/17/2019 12:00 AM    BMP Latest Ref Rng & Units 07/10/2020 06/29/2020 04/17/2019  Glucose 70 - 99 mg/dL - 106(H) 104(H)  BUN 6 - 23 mg/dL - 28(H) 30(H)  Creatinine 0.40 - 1.50 mg/dL - 1.00 1.18  BUN/Creat Ratio 10 - 24 - - 25(H)  Sodium 135 - 145 mEq/L - 137 140  Potassium 3.5 - 5.1 mEq/L - 4.5 4.2  Chloride 96 - 112 mEq/L - 104 100  CO2 19 - 32 mEq/L - 28 21  Calcium 8.6 - 10.3  mg/dL 11.0(H) 11.7(H) 11.4(H)    Current antihypertensive regimen:  Amlodipine 10 mg daily Carvedilol 12.5 mg 1.5 tablets twice daily Doxazosin 4 mg daily  How often are you checking your Blood Pressure?  Patient was to purchase a blood pressure cuff and start to monitor his blood pressure.   Current home BP readings:   What recent interventions/DTPs have been made by any provider to improve Blood Pressure control since last CPP Visit: Patient was to purchase a blood pressure cuff and start to monitor his blood pressure.   Any recent hospitalizations or ED visits since last visit with CPP? No recent hospital visits.  What diet changes have been made to improve Blood Pressure Control?    What exercise is  being done to improve your Blood Pressure Control?   Adherence Review: Is the patient currently on ACE/ARB medication? Yes Does the patient have >5 day gap between last estimated fill dates? No  Unable to reach patient after several attempts.  Care Gaps: AWV - scheduled 03/21/2022 Last BP - 122/60 on 01/15/2021 Hep C screen - never done Tetanus/TDAP - never done Shingrix - never done  Star Rating Drugs: Losartan 100 mg - last filled 01/13/2021 90DS at [place] Pharmacist Assistant [phone]

## 2021-04-06 ENCOUNTER — Other Ambulatory Visit: Payer: Self-pay | Admitting: Internal Medicine

## 2021-04-06 DIAGNOSIS — I4821 Permanent atrial fibrillation: Secondary | ICD-10-CM

## 2021-04-28 ENCOUNTER — Other Ambulatory Visit: Payer: Self-pay

## 2021-04-28 ENCOUNTER — Ambulatory Visit (INDEPENDENT_AMBULATORY_CARE_PROVIDER_SITE_OTHER): Payer: PPO

## 2021-04-28 DIAGNOSIS — Z5181 Encounter for therapeutic drug level monitoring: Secondary | ICD-10-CM

## 2021-04-28 DIAGNOSIS — I4891 Unspecified atrial fibrillation: Secondary | ICD-10-CM | POA: Diagnosis not present

## 2021-04-28 LAB — POCT INR: INR: 2.9 (ref 2.0–3.0)

## 2021-04-28 NOTE — Patient Instructions (Signed)
Description   Continue taking Warfarin 1.5 tablets (7.5mg) daily. Call us with any new medications scheduled for any procedures or any questions. Recheck in 8 weeks. Phone [phone] [facility] Main # [phone] fax [phone]     

## 2021-06-07 ENCOUNTER — Telehealth: Payer: Self-pay | Admitting: Pharmacist

## 2021-06-07 NOTE — Chronic Care Management (AMB) (Signed)
? ? ?  Chronic Care Management ?Pharmacy Assistant  ? ?Name: HULAN SZUMSKI  MRN: [id number] DOB: 28-Jun-1950 ? ?06/08/2021 APPOINTMENT REMINDER ? ? ?[name] was reminded to have all medications, supplements and any blood glucose and blood pressure readings available for review with Jeni Salles, Pharm. D, at his telephone visit on 06/08/2021 at 2:00. ? ? ?Care Gaps: ?AWV - scheduled 03/21/2022 ?Last BP - 122/60 on 01/15/2021 ?Covid booster - overdue ?  ?Star Rating Drugs: ?Losartan 100 mg - last filled 03/24/2021 90DS at [facility] ? ? ?Any gaps in medications fill history?  No ? ?Gennie Alma CMA  ?Clinical Pharmacist Assistant ?[phone] ? ?

## 2021-06-08 ENCOUNTER — Ambulatory Visit (INDEPENDENT_AMBULATORY_CARE_PROVIDER_SITE_OTHER): Payer: PPO | Admitting: Pharmacist

## 2021-06-08 DIAGNOSIS — E785 Hyperlipidemia, unspecified: Secondary | ICD-10-CM

## 2021-06-08 DIAGNOSIS — I10 Essential (primary) hypertension: Secondary | ICD-10-CM

## 2021-06-08 NOTE — Patient Instructions (Addendum)
[place], ? ?It was great to catch up with you again! ? ?Please reach out to me if you have any questions or need anything before our follow up! ? ?Best, ?Maddie ? ?Jeni Salles, PharmD, BCACP ?Clinical Pharmacist ?Therapist, music at [facility] ?[phone] ? ? Visit Information ? ? Goals Addressed   ?None ?  ? ?Patient Care Plan: [place]  ?  ? ?Problem Identified: Problem: Hypertension, Hyperlipidemia, Diabetes, Atrial Fibrillation and constipation, vitamin D deficiency, right hip pain   ?  ? ?Long-Range Goal: Patient-Specific Goal   ?Start Date: 05/08/2020  ?Expected End Date: 05/08/2021  ?Recent Progress: On track  ?Priority: High  ?Note:   ?Current Barriers:  ?Unable to independently monitor therapeutic efficacy ?Unable to maintain control of cholesterol ? ?Pharmacist Clinical Goal(s):  ?patient will achieve control of cholesterol as evidenced by next lipid panel  through collaboration with PharmD and provider.  ? ?Interventions: ?1:1 collaboration with [name], MD regarding development and update of comprehensive plan of care as evidenced by provider attestation and co-signature ?Inter-disciplinary care team collaboration (see longitudinal plan of care) ?Comprehensive medication review performed; medication list updated in electronic medical record ? ?Hypertension (BP goal <140/90) ?-Controlled ?-Current treatment: ?amlodipine 10 mg, 1 tablet daily - Appropriate, Effective, Safe, Accessible ?carvedilol 12.5 mg, 1.5 tablet twice daily  - Appropriate, Effective, Safe, Accessible ?clonidine 0.2 mg, 1 tablet twice daily - Appropriate, Effective, Safe, Accessible ?doxazosin 4 mg, 1 tablet once daily - Appropriate, Effective, Safe, Accessible ?HCTZ 12.5 mg, 1 capsule daily - Appropriate, Effective, Safe, Accessible ?losartan 100 mg, 1 tablet daily - Appropriate, Effective, Safe, Accessible ?-Medications previously tried: none  ?-Current home readings: 126/70 (checking every week) ?-Current  dietary habits: patient mentions he has reduced meat intake and eats plant based chicken, patties; he is working on reducing his sodium ?-Current exercise habits: exercise is limited due to bad right hip and bad left knee and is using a walker ?-Denies hypotensive/hypertensive symptoms ?-Educated on Exercise goal of 150 minutes per week; ?Importance of home blood pressure monitoring; ?-Counseled to monitor BP at home weekly, document, and provide log at future appointments ?-Recommended to continue current medication ? ?Hyperlipidemia: (LDL goal < 70) ?-Not ideally controlled ?-Current treatment: ?ezetimibe '10mg'$ , 1 tablet daily - Appropriate, Query effective, Safe, Accessible ?omega-3 fatty acids '1200mg'$  1 capsule twice daily - Appropriate, Query effective, Safe, Accessible ?-Medications previously tried:  atorvastatin, simvastatin (myalgias)  ?-Current dietary patterns: did not discuss ?-Current exercise habits: limited due to pain ?-Educated on Cholesterol goals;  ?Importance of limiting foods high in cholesterol; ?Exercise goal of 150 minutes per week; ?-Counseled on diet and exercise extensively ?Recommended to continue current medication ? ?Pre-diabetes (A1c goal <6.5%) ?-Controlled ?-Current medications: ?No medications ?-Medications previously tried: none  ?-Current home glucose readings ?fasting glucose: does not check ?post prandial glucose: does not check ?-Denies hypoglycemic/hyperglycemic symptoms ?-Current meal patterns:  ?breakfast: n/a ?lunch: n/a  ?dinner: n/a ?snacks: n/a ?drinks: n/a ?-Current exercise: n/a ?-Educated on Exercise goal of 150 minutes per week; ?Carbohydrate counting and/or plate method ?-Counseled to check feet daily and get yearly eye exams ?-Counseled on diet and exercise extensively ?Recommended repeat A1c check ? ?Atrial Fibrillation (Goal: prevent stroke and major bleeding) ?-Controlled ?-CHADSVASC: 2 ?-Current treatment: ?Rate control: carvedilol 12.5 mg, 1.5 tablet twice daily  - Appropriate, Effective, Safe, Accessible ?Anticoagulation: warfarin 7.5 mg once daily - Appropriate, Effective, Safe, Accessible ?-Medications previously tried: none ?-Home BP and HR readings: does not check  ?-Counseled on  bleeding risk associated with warfarin and importance of self-monitoring for signs/symptoms of bleeding; ?importance of regular laboratory monitoring; ?-Recommended to continue current medication ?Patient has some salads and broccoli but keeps green intake consistent ? ?Vitamin D deficiency (Goal: vitamin D 30-100) ?-Not ideally controlled ?-Current treatment  ?None ?-Medications previously tried: none  ?-Recommended to continue current medication ? ?Constipation (Goal: minimize symptoms and regulate bowel movements) ?-Controlled ?-Current treatment  ?Amitiza 24 mcg, 1 capsule twice daily - Appropriate, Effective, Safe, Accessible ?-Medications previously tried:  biscodyl, docusate, other medications ?-Recommended to continue current medication ?Recommended for patient to add more fiber to his diet ? ?Hip pain (Goal: minimize pain) ?-Controlled ?-Current treatment  ?meloxicam '15mg'$ , 1 tablet daily - Appropriate, Effective, Query Safe, Accessible ?-Medications previously tried: naproxen, hydrocodone/ APAP ?-Counseled on bleed risk with NSAIDs and warfarin ? ? ?Health Maintenance ?-Vaccine gaps: shingles, tetanus, influenza, pneumonia ?-Current therapy:  ?None ?-Educated on Cost vs benefit of each product must be carefully weighed by individual consumer ?-Patient is satisfied with current therapy and denies issues ?- Continue as is ? ?Patient Goals/Self-Care Activities ?patient will:  ?- take medications as prescribed ?check blood pressure weekly, document, and provide at future appointments ? ?Follow Up Plan: Telephone follow up appointment with care management team member scheduled for: 1 year ? ?  ?  ?Patient verbalizes understanding of instructions and care plan provided today and agrees to  view in [place]. Active MyChart status confirmed with patient.   ?Telephone follow up appointment with pharmacy team member scheduled for: 1 year ? ?Viona Gilmore, RPH  ?

## 2021-06-08 NOTE — Progress Notes (Signed)
? ?Chronic Care Management ?Pharmacy Note ? ?06/08/2021 ?Name:  [name] MRN:  [id number] DOB:  10-Feb-1951 ? ?Summary: ?BP is not at goal < 140/90 ?LDL not at goal < 70  ?  ?Recommendations/Changes made from today's visit: ?-Recommend bone density scan per recommendations from endocrinology  ?-Recommended repeat lipid panel and consider addition of bempedoic acid if LDL is > 70 ? ?Plan: ?Patient will schedule CPE with PCP for end of April or early May ?Follow up BP/HLD assessment in 6 months ? ?Subjective: ?[name] is an 71 y.o. year old male who is a primary patient of [name], MD.  The CCM team was consulted for assistance with disease management and care coordination needs.   ? ?Engaged with patient by telephone for follow up visit in response to provider referral for pharmacy case management and/or care coordination services.  ? ?Consent to Services:  ?The patient was given information about Chronic Care Management services, agreed to services, and gave verbal consent prior to initiation of services.  Please see initial visit note for detailed documentation.  ? ?Patient Care Team: ?[name], MD as PCP - General ?Fay Records, MD as PCP - Cardiology (Cardiology) ?Viona Gilmore, [facility] as Pharmacist (Pharmacist) ? ?Recent office visits: ?03/18/2021 Rolene Arbour LPN - Medicare annual wellness exam. ? ?07/10/20 [name], MD: Patient presented for acute visit for a UTI. ? ?Recent consult visits: ?04/28/21 Lisette Abu, RN (cardiology): Patient presented for anti-coag visit. INR 2.9, goal 2-3. Continue 7.5 mg every day. ? ?01/15/2021 Richardson Dopp PA-C (cardiology) - Patient was seen for Permanent atrial fibrillation and additional issues. No medication changes.  Follow up in 9 months.  ? ?09/29/20 Lucio Edward, MD (gastroenterology): Patient presented for colonoscopy. Recommended 7 year follow up colonoscopy. ? ?09/23/20 Renato Shin, MD (endo): Patient presented for initial visit for  hypercalcemia. Recommended decreasing HCTZ and DEXA scan.  ? ?07/14/20 Richardson Dopp, PA-C (cardiology): Patient presented for Afib follow up. Ordered Zio monitor. Follow up in 6 months. ? ?Hospital visits: ?None in previous 6 months ? ?Objective: ? ?Lab Results  ?Component Value Date  ? CREATININE 1.00 06/29/2020  ? BUN 28 (H) 06/29/2020  ? GFR 76.27 06/29/2020  ? GFRNONAA 63 04/17/2019  ? GFRAA 72 04/17/2019  ? NA 137 06/29/2020  ? K 4.5 06/29/2020  ? CALCIUM 11.0 (H) 07/10/2020  ? CO2 28 06/29/2020  ? ? ?Lab Results  ?Component Value Date/Time  ? HGBA1C 5.8 06/29/2020 09:59 AM  ? HGBA1C 5.8 07/20/2017 08:58 AM  ? GFR 76.27 06/29/2020 09:59 AM  ? GFR 73.15 07/20/2017 08:58 AM  ?  ?Last diabetic Eye exam: No results found for: HMDIABEYEEXA  ?Last diabetic Foot exam: No results found for: HMDIABFOOTEX  ? ?Lab Results  ?Component Value Date  ? CHOL 140 06/29/2020  ? HDL 33.00 (L) 06/29/2020  ? Luther 86 06/29/2020  ? LDLDIRECT 168.0 03/25/2014  ? TRIG 104.0 06/29/2020  ? CHOLHDL 4 06/29/2020  ? ? ? ?  Latest Ref Rng & Units 06/29/2020  ?  9:59 AM 07/20/2017  ?  8:58 AM 07/18/2016  ?  9:22 AM  ?Hepatic Function  ?Total Protein 6.0 - 8.3 g/dL 6.9   6.9   6.8    ?Albumin 3.5 - 5.2 g/dL 4.1   4.2   4.3    ?AST 0 - 37 U/L '17   16   17    ' ?ALT 0 - 53 U/L 19   19  20    ?Alk Phosphatase 39 - 117 U/L 108   95   104    ?Total Bilirubin 0.2 - 1.2 mg/dL 0.7   0.7   0.6    ?Bilirubin, Direct 0.0 - 0.3 mg/dL 0.1   0.2   0.1    ? ? ?Lab Results  ?Component Value Date/Time  ? TSH 1.59 06/29/2020 09:59 AM  ? TSH 1.560 03/29/2019 04:55 PM  ? FREET4 0.92 06/29/2020 09:59 AM  ? ? ? ?  Latest Ref Rng & Units 06/29/2020  ?  9:59 AM 03/29/2019  ?  4:55 PM 07/20/2017  ?  8:58 AM  ?CBC  ?WBC 4.0 - 10.5 K/uL 6.9   11.6   8.9    ?Hemoglobin 13.0 - 17.0 g/dL 12.9   13.7   14.2    ?Hematocrit 39.0 - 52.0 % 37.7   40.7   40.4    ?Platelets 150.0 - 400.0 K/uL 214.0   237   200.0    ? ? ?Lab Results  ?Component Value Date/Time  ? VD25OH 20.47 (L)  10/14/2013 01:56 PM  ? VD25OH <10 (L) 07/18/2013 11:58 AM  ? ? ?Clinical ASCVD: No  ?The ASCVD Risk score (Arnett DK, et al., 2019) failed to calculate for the following reasons: ?  The systolic blood pressure is missing   ? ? ?  03/18/2021  ?  3:23 PM 06/29/2020  ?  9:58 AM 07/21/2016  ? 11:23 AM  ?Depression screen PHQ 2/9  ?Decreased Interest 0 0 0  ?Down, Depressed, Hopeless 0 0 0  ?PHQ - 2 Score 0 0 0  ?Altered sleeping  2   ?Tired, decreased energy  0   ?Change in appetite  0   ?Feeling bad or failure about yourself   0   ?Trouble concentrating  0   ?Moving slowly or fidgety/restless  0   ?Suicidal thoughts  0   ?PHQ-9 Score  2   ?Difficult doing work/chores  Not difficult at all   ?  ? ?CHA2DS2/VAS Stroke Risk Points  Current as of about an hour ago  ?   2 >= 2 Points: High Risk  ?1 - 1.99 Points: Medium Risk  ?0 Points: Low Risk  ?  Last Change: N/A   ?  ? Details   ? This score determines the patient's risk of having a stroke if the  ?patient has atrial fibrillation.  ?  ?  ? Points Metrics  ?0 Has Congestive Heart Failure:  No   ? Current as of about an hour ago  ?0 Has Vascular Disease:  No   ? Current as of about an hour ago  ?1 Has Hypertension:  Yes   ? Current as of about an hour ago  ?1 Age:  71   ? Current as of about an hour ago  ?0 Has Diabetes:  No   ? Current as of about an hour ago  ?0 Had Stroke:  No  Had TIA:  No  Had Thromboembolism:  No   ? Current as of about an hour ago  ?0 Male:  No   ? Current as of about an hour ago  ?  ? ?Social History  ? ?Tobacco Use  ?Smoking Status Never  ?Smokeless Tobacco Never  ? ?BP Readings from Last 3 Encounters:  ?01/15/21 122/60  ?09/29/20 130/80  ?09/23/20 (!) 146/74  ? ?Pulse Readings from Last 3 Encounters:  ?01/15/21 60  ?09/29/20 (!) 56  ?09/23/20 66  ? ?  Wt Readings from Last 3 Encounters:  ?03/18/21 267 lb (121.1 kg)  ?01/15/21 266 lb 12.5 oz (121 kg)  ?09/29/20 271 lb (122.9 kg)  ? ? ?Assessment/Interventions: Review of patient past medical history,  allergies, medications, health status, including review of consultants reports, laboratory and other test data, was performed as part of comprehensive evaluation and provision of chronic care management services.  ? ?SDOH:  (Social Determinants of Health) assessments and interventions performed: Yes ? ? ? ?CCM Care Plan ? ?Allergies  ?Allergen Reactions  ? Statins   ? Penicillins   ?  REACTION: unspecified  ? ? ?Medications Reviewed Today   ? ? Reviewed by Viona Gilmore, [place] (Pharmacist) on 06/08/21 at 1427  Med List Status: <None>  ? ?Medication Order Taking? Sig Documenting Provider Last Dose Status Informant  ?amLODipine (NORVASC) 10 MG tablet [id number]  Take 1 tablet (10 mg total) by mouth daily. [name], MD  Active   ?carvedilol (COREG) 12.5 MG tablet [id number] Yes TAKE 1 AND 1/2 TABLET BY MOUTH TWO TIMES A DAY Fay Records, MD Taking Active   ?cloNIDine (CATAPRES) 0.2 MG tablet [id number] Yes Take 1 tablet (0.2 mg total) by mouth 2 (two) times daily. [name], MD Taking Active   ?doxazosin (CARDURA) 4 MG tablet [id number] Yes Take 1 tablet (4 mg total) by mouth daily. [name], MD Taking Active   ?ezetimibe (ZETIA) 10 MG tablet [id number] Yes Take 1 tablet (10 mg total) by mouth daily. [name], MD Taking Active   ?hydrochlorothiazide (MICROZIDE) 12.5 MG capsule [id number] Yes Take 1 capsule (12.5 mg total) by mouth daily. [name], MD Taking Active   ?ketoconazole (NIZORAL) 2 % cream [id number]  Apply 1 application topically 2 (two) times daily as needed for irritation. [name], MD  Active   ?losartan (COZAAR) 100 MG tablet [id number] Yes Take 1 tablet (100 mg total) by mouth daily. [name], MD Taking Active   ?lubiprostone (AMITIZA) 24 MCG capsule [id number] Yes Take 1 capsule (24 mcg total) by mouth 2 (two) times daily. [name], MD Taking Active   ?meloxicam (MOBIC) 15 MG tablet [id number]  Take 1 tablet (15 mg total) by mouth daily. [name], MD   Active   ?OMEGA-3 FATTY ACIDS PO [id number] Yes Take 1,200 mg by mouth in the morning and at bedtime. [provider] Taking Active Self  ?warfarin (COUMADIN) 5 MG tablet [id number]  TAKE 1 TO 1 AND 1/2 TABLETS

## 2021-06-22 ENCOUNTER — Other Ambulatory Visit: Payer: Self-pay | Admitting: Family Medicine

## 2021-06-22 DIAGNOSIS — I10 Essential (primary) hypertension: Secondary | ICD-10-CM

## 2021-06-23 ENCOUNTER — Ambulatory Visit (INDEPENDENT_AMBULATORY_CARE_PROVIDER_SITE_OTHER): Payer: PPO

## 2021-06-23 DIAGNOSIS — I4891 Unspecified atrial fibrillation: Secondary | ICD-10-CM

## 2021-06-23 DIAGNOSIS — Z5181 Encounter for therapeutic drug level monitoring: Secondary | ICD-10-CM | POA: Diagnosis not present

## 2021-06-23 LAB — POCT INR: INR: 2.9 (ref 2.0–3.0)

## 2021-06-23 NOTE — Patient Instructions (Signed)
Description   Continue taking Warfarin 1.5 tablets (7.5mg) daily. Call us with any new medications scheduled for any procedures or any questions. Recheck in 8 weeks. Phone [phone] [facility] Main # [phone] fax [phone]     

## 2021-07-04 DIAGNOSIS — E785 Hyperlipidemia, unspecified: Secondary | ICD-10-CM | POA: Diagnosis not present

## 2021-07-04 DIAGNOSIS — I10 Essential (primary) hypertension: Secondary | ICD-10-CM | POA: Diagnosis not present

## 2021-07-10 ENCOUNTER — Other Ambulatory Visit: Payer: Self-pay | Admitting: Family Medicine

## 2021-07-15 ENCOUNTER — Encounter: Payer: Self-pay | Admitting: Family Medicine

## 2021-07-15 ENCOUNTER — Ambulatory Visit (INDEPENDENT_AMBULATORY_CARE_PROVIDER_SITE_OTHER): Payer: PPO | Admitting: Family Medicine

## 2021-07-15 VITALS — BP 118/78 | HR 96 | Temp 97.9°F | Ht 70.0 in | Wt 271.0 lb

## 2021-07-15 DIAGNOSIS — Z23 Encounter for immunization: Secondary | ICD-10-CM

## 2021-07-15 DIAGNOSIS — L97812 Non-pressure chronic ulcer of other part of right lower leg with fat layer exposed: Secondary | ICD-10-CM

## 2021-07-15 DIAGNOSIS — Z0000 Encounter for general adult medical examination without abnormal findings: Secondary | ICD-10-CM | POA: Diagnosis not present

## 2021-07-15 DIAGNOSIS — I872 Venous insufficiency (chronic) (peripheral): Secondary | ICD-10-CM | POA: Diagnosis not present

## 2021-07-15 DIAGNOSIS — I10 Essential (primary) hypertension: Secondary | ICD-10-CM | POA: Diagnosis not present

## 2021-07-15 DIAGNOSIS — I83009 Varicose veins of unspecified lower extremity with ulcer of unspecified site: Secondary | ICD-10-CM | POA: Insufficient documentation

## 2021-07-15 LAB — CBC WITH DIFFERENTIAL/PLATELET
Basophils Absolute: 0 10*3/uL (ref 0.0–0.1)
Basophils Relative: 0.3 % (ref 0.0–3.0)
Eosinophils Absolute: 0.1 10*3/uL (ref 0.0–0.7)
Eosinophils Relative: 1.6 % (ref 0.0–5.0)
HCT: 36.2 % — ABNORMAL LOW (ref 39.0–52.0)
Hemoglobin: 12.1 g/dL — ABNORMAL LOW (ref 13.0–17.0)
Lymphocytes Relative: 16 % (ref 12.0–46.0)
Lymphs Abs: 1.2 10*3/uL (ref 0.7–4.0)
MCHC: 33.5 g/dL (ref 30.0–36.0)
MCV: 89.5 fl (ref 78.0–100.0)
Monocytes Absolute: 0.7 10*3/uL (ref 0.1–1.0)
Monocytes Relative: 9.2 % (ref 3.0–12.0)
Neutro Abs: 5.6 10*3/uL (ref 1.4–7.7)
Neutrophils Relative %: 72.9 % (ref 43.0–77.0)
Platelets: 220 10*3/uL (ref 150.0–400.0)
RBC: 4.04 Mil/uL — ABNORMAL LOW (ref 4.22–5.81)
RDW: 14.8 % (ref 11.5–15.5)
WBC: 7.7 10*3/uL (ref 4.0–10.5)

## 2021-07-15 LAB — BASIC METABOLIC PANEL
BUN: 23 mg/dL (ref 6–23)
CO2: 28 mEq/L (ref 19–32)
Calcium: 10.9 mg/dL — ABNORMAL HIGH (ref 8.4–10.5)
Chloride: 104 mEq/L (ref 96–112)
Creatinine, Ser: 0.93 mg/dL (ref 0.40–1.50)
GFR: 82.61 mL/min (ref >60.00)
Glucose, Bld: 101 mg/dL — ABNORMAL HIGH (ref 70–99)
Potassium: 4.4 mEq/L (ref 3.5–5.1)
Sodium: 135 mEq/L (ref 135–145)

## 2021-07-15 LAB — HEPATIC FUNCTION PANEL
ALT: 17 U/L (ref 0–53)
AST: 16 U/L (ref 0–37)
Albumin: 4.1 g/dL (ref 3.5–5.2)
Alkaline Phosphatase: 109 U/L (ref 39–117)
Bilirubin, Direct: 0.2 mg/dL (ref 0.0–0.3)
Total Bilirubin: 0.7 mg/dL (ref 0.2–1.2)
Total Protein: 6.8 g/dL (ref 6.0–8.3)

## 2021-07-15 LAB — LIPID PANEL
Cholesterol: 147 mg/dL (ref 0–200)
HDL: 34.1 mg/dL — ABNORMAL LOW (ref >39.00)
LDL Cholesterol: 92 mg/dL (ref 0–99)
NonHDL: 113.02
Total CHOL/HDL Ratio: 4
Triglycerides: 103 mg/dL (ref 0.0–149.0)
VLDL: 20.6 mg/dL (ref 0.0–40.0)

## 2021-07-15 LAB — HEMOGLOBIN A1C: Hgb A1c MFr Bld: 5.8 % (ref 4.6–6.5)

## 2021-07-15 LAB — TSH: TSH: 1.83 u[IU]/mL (ref 0.35–5.50)

## 2021-07-15 LAB — PSA: PSA: 0.03 ng/mL — ABNORMAL LOW (ref 0.10–4.00)

## 2021-07-15 MED ORDER — LUBIPROSTONE 24 MCG PO CAPS: 24.0000 ug | ORAL_CAPSULE | Freq: Two times a day (BID) | ORAL | 3 refills | Status: DC

## 2021-07-15 MED ORDER — CLONIDINE HCL 0.2 MG PO TABS: 0.2000 mg | ORAL_TABLET | Freq: Two times a day (BID) | ORAL | 3 refills | Status: DC

## 2021-07-15 MED ORDER — DOXAZOSIN MESYLATE 4 MG PO TABS: 4.0000 mg | ORAL_TABLET | Freq: Every day | ORAL | 3 refills | Status: DC

## 2021-07-15 MED ORDER — EZETIMIBE 10 MG PO TABS: 10.0000 mg | ORAL_TABLET | Freq: Every day | ORAL | 3 refills | Status: DC

## 2021-07-15 MED ORDER — LOSARTAN POTASSIUM 100 MG PO TABS: 100.0000 mg | ORAL_TABLET | Freq: Every day | ORAL | 3 refills | Status: DC

## 2021-07-15 MED ORDER — HYDROCHLOROTHIAZIDE 12.5 MG PO CAPS: 12.5000 mg | ORAL_CAPSULE | Freq: Every day | ORAL | 3 refills | Status: DC

## 2021-07-15 MED ORDER — MELOXICAM 15 MG PO TABS: 15.0000 mg | ORAL_TABLET | Freq: Every day | ORAL | 3 refills | Status: DC

## 2021-07-15 MED ORDER — AMLODIPINE BESYLATE 10 MG PO TABS: 10.0000 mg | ORAL_TABLET | Freq: Every day | ORAL | 3 refills | Status: DC

## 2021-07-15 NOTE — Progress Notes (Signed)
? ?Subjective:  ? ? Patient ID: [name], male    DOB: [birth year], 71 y.o.   MRN: [id number] ? ?HPI ?Here with his nephew for a well exam. In general he has few complaints. About 6 weeks ago a large ulcer appeared on the right lower leg. This is not painful. His nephew has ben dressing this daily with Neosporin, gauze, and a compression wrap. It has healed in a little, but very slowly. He sees Cardiology about twice a year, and he sees the Coumadin clinic every 8 weeks.  ? ? ?Review of Systems  ?Constitutional: Negative.   ?HENT: Negative.    ?Eyes: Negative.   ?Respiratory: Negative.    ?Cardiovascular: Negative.   ?Gastrointestinal: Negative.   ?Genitourinary: Negative.   ?Musculoskeletal: Negative.   ?Skin:  Positive for wound.  ?Neurological: Negative.   ?Psychiatric/Behavioral: Negative.    ? ?   ?Objective:  ? Physical Exam ?Constitutional:   ?   General: He is not in acute distress. ?   Appearance: Normal appearance. He is well-developed. He is not diaphoretic.  ?   Comments: In a wheelchair   ?HENT:  ?   Head: Normocephalic and atraumatic.  ?   Right Ear: External ear normal.  ?   Left Ear: External ear normal.  ?   Nose: Nose normal.  ?   Mouth/Throat:  ?   Pharynx: No oropharyngeal exudate.  ?Eyes:  ?   General: No scleral icterus.    ?   Right eye: No discharge.     ?   Left eye: No discharge.  ?   Conjunctiva/sclera: Conjunctivae normal.  ?   Pupils: Pupils are equal, round, and reactive to light.  ?Neck:  ?   Thyroid: No thyromegaly.  ?   Vascular: No JVD.  ?   Trachea: No tracheal deviation.  ?Cardiovascular:  ?   Rate and Rhythm: Normal rate. Rhythm irregular.  ?   Heart sounds:  ?  No friction rub. No gallop.  ?   Comments: Has his usual 2/6 SM  ?Pulmonary:  ?   Effort: Pulmonary effort is normal. No respiratory distress.  ?   Breath sounds: Normal breath sounds. No wheezing or rales.  ?Chest:  ?   Chest wall: No tenderness.  ?Abdominal:  ?   General: Bowel sounds are normal. There is no  distension.  ?   Palpations: Abdomen is soft. There is no mass.  ?   Tenderness: There is no abdominal tenderness. There is no guarding or rebound.  ?Genitourinary: ?   Penis: Normal. No tenderness.   ?   Testes: Normal.  ?   Prostate: Normal.  ?   Rectum: Normal. Guaiac result negative.  ?Musculoskeletal:     ?   General: No tenderness. Normal range of motion.  ?   Cervical back: Neck supple.  ?Lymphadenopathy:  ?   Cervical: No cervical adenopathy.  ?Skin: ?   General: Skin is warm and dry.  ?   Coloration: Skin is not pale.  ?   Findings: No erythema or rash.  ?   Comments: The right lower lateral leg has a 12 cm shallow ulcerated area which is weeping some serous fluid. There are patches of granulation tissue here and there. No signs of infection   ?Neurological:  ?   Mental Status: He is alert and oriented to person, place, and time.  ?   Cranial Nerves: No cranial nerve deficit.  ?   Motor: No  abnormal muscle tone.  ?   Coordination: Coordination normal.  ?   Deep Tendon Reflexes: Reflexes are normal and symmetric. Reflexes normal.  ?Psychiatric:     ?   Behavior: Behavior normal.     ?   Thought Content: Thought content normal.     ?   Judgment: Judgment normal.  ? ? ? ? ? ?   ?Assessment & Plan:  ?Well exam. We discussed diet and exercise. Get fasting labs. We will refer him to the Wound Clinic to help treat the venous stasis ulcer.  ?Alysia Penna, MD ? ? ?

## 2021-07-15 NOTE — Addendum Note (Signed)
Addended by: Wyvonne Lenz on: 07/15/2021 10:00 AM ? ? Modules accepted: Orders ? ?

## 2021-07-16 ENCOUNTER — Other Ambulatory Visit: Payer: Self-pay

## 2021-07-16 MED ORDER — IRON (FERROUS SULFATE) 325 (65 FE) MG PO TABS: 325.0000 mg | ORAL_TABLET | Freq: Every day | ORAL | 3 refills | Status: DC

## 2021-07-21 NOTE — Progress Notes (Addendum)
REMINGTON, HIGHBAUGH ([id number]) Visit Report for [facility] Allergy List Details Patient Name: Date of Service: [name] 1:15 PM Medical Record Number: [id number] Patient Account Number: [id number] Date of Birth/Sex: Treating RN: 11-13-1950 (71 y.o. [name]: [name] Other Clinician: Referring Tashe Purdon: Treating Kattleya Kuhnert/Extender: Charmaine Downs Weeks in Treatment: 0 Allergies Active Allergies Statins-Hmg-Coa Reductase Inhibitors penicillin Allergy Notes Electronic Signature(s) Signed: 07/22/2021 6:05:14 PM By: Lorrin Jackson Previous Signature: 07/21/2021 2:36:53 PM Version By: Lorrin Jackson Entered By: Lorrin Jackson on 07/22/2021 13:31:40 -------------------------------------------------------------------------------- Arrival Information Details Patient Name: Date of Service: [name], [name] 1:15 PM Medical Record Number: [id number] Patient Account Number: [id number] Date of Birth/Sex: Treating RN: 1950-10-11 (71 y.o. [name]: [name] Other Clinician: Referring Mandela Bello: Treating Myesha Kline/Extender: Delford Field in Treatment: 0 Visit Information Patient Arrived: Wheel Chair Arrival Time: 13:28 Accompanied By: nephew Transfer Assistance: None Patient Identification Verified: Yes Secondary Verification Process Completed: Yes Patient Requires Transmission-Based Precautions: No Patient Has Alerts: Yes Patient Alerts: Patient on Blood Thinner Electronic Signature(s) Signed: 07/22/2021 6:05:14 PM By: Lorrin Jackson Entered By: Lorrin Jackson on 07/22/2021 13:28:59 -------------------------------------------------------------------------------- Clinic Level of Care Assessment Details Patient Name: Date of Service: [name] 1:15 PM Medical Record Number: [id number] Patient Account Number: [id number] Date of Birth/Sex:  Treating RN: Nov 01, 1950 (71 y.o. [name]: [name] Other Clinician: Referring Ligia Duguay: Treating Myndi Wamble/Extender: Delford Field in Treatment: 0 Clinic Level of Care Assessment Items TOOL 1 Quantity Score X- 1 0 Use when EandM and Procedure is performed on INITIAL visit ASSESSMENTS - Nursing Assessment / Reassessment X- 1 20 General Physical Exam (combine w/ comprehensive assessment (listed just below) when performed on new pt. evals) X- 1 25 Comprehensive Assessment (HX, ROS, Risk Assessments, Wounds Hx, etc.) ASSESSMENTS - Wound and Skin Assessment / Reassessment '[]'$  - 0 Dermatologic / Skin Assessment (not related to wound area) ASSESSMENTS - Ostomy and/or Continence Assessment and Care '[]'$  - 0 Incontinence Assessment and Management '[]'$  - 0 Ostomy Care Assessment and Management (repouching, etc.) PROCESS - Coordination of Care '[]'$  - 0 Simple Patient / Family Education for ongoing care X- 1 20 Complex (extensive) Patient / Family Education for ongoing care X- 1 10 Staff obtains Programmer, systems, Records, T Results / Process Orders est X- 1 10 Staff telephones HHA, Nursing Homes / Clarify orders / etc '[]'$  - 0 Routine Transfer to another Facility (non-emergent condition) '[]'$  - 0 Routine Hospital Admission (non-emergent condition) '[]'$  - 0 New Admissions / Biomedical engineer / Ordering NPWT Apligraf, etc. , '[]'$  - 0 Emergency Hospital Admission (emergent condition) PROCESS - Special Needs '[]'$  - 0 Pediatric / Minor Patient Management '[]'$  - 0 Isolation Patient Management '[]'$  - 0 Hearing / Language / Visual special needs '[]'$  - 0 Assessment of Community assistance (transportation, D/C planning, etc.) '[]'$  - 0 Additional assistance / Altered mentation '[]'$  - 0 Support Surface(s) Assessment (bed, cushion, seat, etc.) INTERVENTIONS - Miscellaneous '[]'$  - 0 External ear exam '[]'$  - 0 Patient Transfer (multiple staff / Civil Service fast streamer  / Similar devices) '[]'$  - 0 Simple Staple / Suture removal (25 or less) '[]'$  - 0 Complex Staple / Suture removal (26 or more) '[]'$  - 0 Hypo/Hyperglycemic Management (do not check if billed separately) X- 1 15 Ankle / Brachial Index (ABI) - do not check if billed separately Has the  patient been seen at the hospital within the last three years: Yes Total Score: 100 Level Of Care: New/Established - Level 3 Electronic Signature(s) Signed: 07/22/2021 6:05:14 PM By: Lorrin Jackson Signed: 07/22/2021 6:05:14 PM By: Lorrin Jackson Entered By: Lorrin Jackson on 07/22/2021 15:00:58 -------------------------------------------------------------------------------- Encounter Discharge Information Details Patient Name: Date of Service: [name] 1:15 PM Medical Record Number: [id number] Patient Account Number: [id number] Date of Birth/Sex: Treating RN: [birth year] (72 y.o. [name]: [name] Other Clinician: Referring Tyrie Porzio: Treating Keeleigh Kline/Extender: Delford Field in Treatment: 0 Encounter Discharge Information Items Post Procedure Vitals Discharge Condition: Stable Temperature (F): 98.2 Ambulatory Status: Wheelchair Pulse (bpm): 66 Discharge Destination: Home Respiratory Rate (breaths/min): 18 Transportation: Private Auto Blood Pressure (mmHg): 143/86 Accompanied By: nephew Schedule Follow-up Appointment: Yes Clinical Summary of Care: Provided on Kline Form Type Recipient Paper Patient Patient Electronic Signature(s) Signed: 07/22/2021 6:05:14 PM By: Lorrin Jackson Entered By: Lorrin Jackson on 07/22/2021 15:02:30 -------------------------------------------------------------------------------- Lower Extremity Assessment Details Patient Name: Date of Service: [name] 1:15 PM Medical Record Number: [id number] Patient Account Number: [id number] Date of Birth/Sex: Treating RN: 1950-06-17  (71 y.o. [name]: [name] Other Clinician: Referring Khye Hochstetler: Treating Laymon Stockert/Extender: Delford Field in Treatment: 0 Edema Assessment Assessed: [name]: No] Patrice Paradise: Yes] Edema: [Left: Ye] [Right: s] Calf Left: Right: Point of Measurement: 34 cm From Medial Instep 46.5 cm Ankle Left: Right: Point of Measurement: 12 cm From Medial Instep 24.5 cm Knee To Floor Left: Right: From Medial Instep 44 cm Vascular Assessment Pulses: Dorsalis Pedis Palpable: [Right:Yes] Doppler Audible: [Right:Yes] Blood Pressure: Brachial: [Right:143] Ankle: [Right:Dorsalis Pedis: 40 0.28] Electronic Signature(s) Signed: 07/22/2021 6:05:14 PM By: Lorrin Jackson Entered By: Lorrin Jackson on 07/22/2021 13:59:52 -------------------------------------------------------------------------------- Multi Wound Chart Details Patient Name: Date of Service: [name], [name] 1:15 PM Medical Record Number: [id number] Patient Account Number: [id number] Date of Birth/Sex: Treating RN: [birth year] (71 y.o. [name] Primary Care Demika Langenderfer: [name] Other Clinician: Referring Jakeline Dave: Treating Shlomo Seres/Extender: Delford Field in Treatment: 0 Vital Signs Height(in): 71 Pulse(bpm): 78 Weight(lbs): 47 Blood Pressure(mmHg): 143/86 Body Mass Index(BMI): 40 Temperature(F): 98.2 Respiratory Rate(breaths/min): 18 Photos: [N/A:N/A] Right, Anterior Lower Leg N/A N/A Wound Location: Gradually Appeared N/A N/A Wounding Event: Venous Leg Ulcer N/A N/A Primary Etiology: Sleep Apnea, Arrhythmia, N/A N/A Comorbid History: Hypertension, Peripheral Venous Disease, Osteoarthritis 07/01/2021 N/A N/A Date Acquired: 0 N/A N/A Weeks of Treatment: Open N/A N/A Wound Status: No N/A N/A Wound Recurrence: 16x8x0.1 N/A N/A Measurements L x W x D (cm) 100.531 N/A N/A A (cm) : rea 10.053 N/A N/A Volume  (cm) : 0.00% N/A N/A % Reduction in Area: 0.00% N/A N/A % Reduction in Volume: Full Thickness Without Exposed N/A N/A Classification: Support Structures Medium N/A N/A Exudate Amount: Serous N/A N/A Exudate Type: amber N/A N/A Exudate Color: Distinct, outline attached N/A N/A Wound Margin: Large (67-100%) N/A N/A Granulation Amount: Red, Pink N/A N/A Granulation Quality: Small (1-33%) N/A N/A Necrotic Amount: Fat Layer (Subcutaneous Tissue): Yes N/A N/A Exposed Structures: Fascia: No Tendon: No Muscle: No Joint: No Bone: No None N/A N/A Epithelialization: Debridement - Excisional N/A N/A Debridement: 14:29 N/A N/A Pre-procedure Verification/Time Out Taken: Other N/A N/A Pain Control: Subcutaneous, Slough N/A N/A Tissue Debrided: Skin/Subcutaneous Tissue N/A N/A Level: 64 N/A N/A Debridement A (sq cm): rea Curette N/A N/A Instrument: Minimum  N/A N/A Bleeding: Pressure N/A N/A Hemostasis A chieved: Procedure was tolerated well N/A N/A Debridement Treatment Response: 16x8x0.1 N/A N/A Post Debridement Measurements L x W x D (cm) 10.053 N/A N/A Post Debridement Volume: (cm) Debridement N/A N/A Procedures Performed: Treatment Notes Electronic Signature(s) Signed: 07/22/2021 3:07:31 PM By: Kalman Shan DO Signed: 07/22/2021 6:05:14 PM By: Lorrin Jackson Entered By: Kalman Shan on 07/22/2021 14:52:07 -------------------------------------------------------------------------------- Multi-Disciplinary Care Plan Details Patient Name: Date of Service: [name], [name] 1:15 PM Medical Record Number: [id number] Patient Account Number: [id number] Date of Birth/Sex: Treating RN: [birth year] (71 y.o. [name] Primary Care Chanae Gemma: [name] Other Clinician: Referring Homar Weinkauf: Treating Jaysha Lasure/Extender: Delford Field in Treatment: 0 Active Inactive Abuse / Safety / Falls / Self Care  Management Nursing Diagnoses: Impaired physical mobility Goals: Patient will remain injury free related to falls Date Initiated: Kline Target Resolution Date: 08/19/2021 Goal Status: Active Interventions: Assess fall risk on admission and as needed Assess: immobility, friction, shearing, incontinence upon admission and as needed Assess impairment of mobility on admission and as needed per policy Notes: Wound/Skin Impairment Nursing Diagnoses: Impaired tissue integrity Goals: Patient/caregiver will verbalize understanding of skin care regimen Date Initiated: Kline Target Resolution Date: 08/19/2021 Goal Status: Active Ulcer/skin breakdown will have a volume reduction of 30% by week 4 Date Initiated: Kline Target Resolution Date: 08/19/2021 Goal Status: Active Interventions: Assess patient/caregiver ability to obtain necessary supplies Assess patient/caregiver ability to perform ulcer/skin care regimen upon admission and as needed Assess ulceration(s) every visit Provide education on ulcer and skin care Treatment Activities: Topical wound management initiated : Kline Notes: Electronic Signature(s) Signed: 07/22/2021 6:05:14 PM By: Lorrin Jackson Entered By: Lorrin Jackson on 07/22/2021 14:06:18 -------------------------------------------------------------------------------- Pain Assessment Details Patient Name: Date of Service: [name] 1:15 PM Medical Record Number: [id number] Patient Account Number: [id number] Date of Birth/Sex: Treating RN: 1950/06/22 (71 y.o. [name] Primary Care Hershell Brandl: [name] Other Clinician: Referring Adaly Puder: Treating Malita Ignasiak/Extender: Delford Field in Treatment: 0 Active Problems Location of Pain Severity and Description of Pain Patient Has Paino Yes Site Locations Pain Location: Pain in Ulcers With Dressing Change: Yes Duration of the Pain. Constant /  Intermittento Intermittent Rate the pain. Current Pain Level: 2 Character of Pain Describe the Pain: Tender, Throbbing Pain Management and Medication Current Pain Management: Medication: Yes Cold Application: No Rest: Yes Massage: No Activity: No T.E.N.S.: No Heat Application: No Leg drop or elevation: No Is the Current Pain Management Adequate: Adequate How does your wound impact your activities of daily livingo Sleep: No Bathing: No Appetite: No Relationship With Others: No Bladder Continence: No Emotions: No Bowel Continence: No Work: No Toileting: No Drive: No Dressing: No Hobbies: No Electronic Signature(s) Signed: 07/22/2021 6:05:14 PM By: Lorrin Jackson Entered By: Lorrin Jackson on 07/22/2021 14:05:16 -------------------------------------------------------------------------------- Patient/Caregiver Education Details Patient Name: Date of Service: Blake Lull D. 5/18/2023andnbsp1:15 PM Medical Record Number: [id number] Patient Account Number: [id number] Date of Birth/Gender: Treating RN: 03/05/1951 (71 y.o. [name] Primary Care Physician: [name] Other Clinician: Referring Physician: Treating Physician/Extender: Delford Field in Treatment: 0 Education Assessment Education Provided To: Patient Education Topics Provided Safety: Methods: Explain/Verbal, Printed Responses: State content correctly Venous: Methods: Explain/Verbal, Printed Responses: State content correctly Wound/Skin Impairment: Methods: Demonstration, Explain/Verbal, Printed Responses: State content correctly Electronic Signature(s) Signed: 07/22/2021 6:05:14 PM By: Lorrin Jackson Entered By: Lorrin Jackson on 07/22/2021 14:28:00 --------------------------------------------------------------------------------  Wound Assessment Details Patient Name: Date of Service: [name] 1:15 PM Medical Record Number: [id number] Patient  Account Number: [id number] Date of Birth/Sex: Treating RN: [birth year] (71 y.o. [name] Primary Care Meliya Mcconahy: [name] Other Clinician: Referring Tynesha Free: Treating Mitchell Iwanicki/Extender: Delford Field in Treatment: 0 Wound Status Wound Number: 1 Primary Venous Leg Ulcer Etiology: Wound Location: Right, Anterior Lower Leg Wound Open Wounding Event: Gradually Appeared Status: Date Acquired: 07/01/2021 Comorbid Sleep Apnea, Arrhythmia, Hypertension, Peripheral Venous Weeks Of Treatment: 0 History: Disease, Osteoarthritis Clustered Wound: No Photos Wound Measurements Length: (cm) 16 Width: (cm) 8 Depth: (cm) 0.1 Area: (cm) 100.531 Volume: (cm) 10.053 % Reduction in Area: 0% % Reduction in Volume: 0% Epithelialization: None Tunneling: No Undermining: No Wound Description Classification: Full Thickness Without Exposed Support Structures Wound Margin: Distinct, outline attached Exudate Amount: Medium Exudate Type: Serous Exudate Color: amber Foul Odor After Cleansing: No Slough/Fibrino Yes Wound Bed Granulation Amount: Large (67-100%) Exposed Structure Granulation Quality: Red, Pink Fascia Exposed: No Necrotic Amount: Small (1-33%) Fat Layer (Subcutaneous Tissue) Exposed: Yes Necrotic Quality: Adherent Slough Tendon Exposed: No Muscle Exposed: No Joint Exposed: No Bone Exposed: No Treatment Notes Wound #1 (Lower Leg) Wound Laterality: Right, Anterior Cleanser Soap and Water Discharge Instruction: May shower and wash wound with dial antibacterial soap and water prior to dressing change. Wound Cleanser Discharge Instruction: Cleanse the wound with wound cleanser prior to applying a clean dressing using gauze sponges, not tissue or cotton balls. Peri-Wound Care Triamcinolone 15 (g) Discharge Instruction: Use triamcinolone 15 (g) as directed Sween Lotion (Moisturizing lotion) Discharge Instruction: Apply moisturizing lotion as  directed Topical Gentamicin Discharge Instruction: As directed by physician Mupirocin Ointment Discharge Instruction: Apply Mupirocin (Bactroban) as instructed Primary Dressing Maxorb Extra Calcium Alginate Dressing, 4x4 in Discharge Instruction: Apply calcium alginate to wound bed as instructed Secondary Dressing ABD Pad, 8x10 Discharge Instruction: Apply over primary dressing as directed. Zetuvit Plus 4x8 in Discharge Instruction: Apply over primary dressing as directed. Secured With Compression Wrap Kerlix Roll 4.5x3.1 (in/yd) Discharge Instruction: Apply Kerlix and Coban compression as directed. Coban Self-Adherent Wrap 4x5 (in/yd) Discharge Instruction: Apply over Kerlix as directed. Compression Stockings Add-Ons Electronic Signature(s) Signed: 07/22/2021 6:05:14 PM By: Lorrin Jackson Entered By: Lorrin Jackson on 07/22/2021 14:03:35 -------------------------------------------------------------------------------- Vitals Details Patient Name: Date of Service: [name], [name] 1:15 PM Medical Record Number: [id number] Patient Account Number: [id number] Date of Birth/Sex: Treating RN: 05/19/1950 (71 y.o. [name] Primary Care Matsue Strom: [name] Other Clinician: Referring Trenda Corliss: Treating Pallavi Clifton/Extender: Delford Field in Treatment: 0 Vital Signs Time Taken: 13:30 Temperature (F): 98.2 Height (in): 69 Pulse (bpm): 66 Source: Stated Respiratory Rate (breaths/min): 18 Weight (lbs): 271 Blood Pressure (mmHg): 143/86 Source: Stated Reference Range: 80 - 120 mg / dl Body Mass Index (BMI): 40 Electronic Signature(s) Signed: 07/22/2021 6:05:14 PM By: Lorrin Jackson Entered By: Lorrin Jackson on 07/22/2021 13:31:06

## 2021-07-22 ENCOUNTER — Encounter (HOSPITAL_BASED_OUTPATIENT_CLINIC_OR_DEPARTMENT_OTHER): Payer: PPO | Attending: Internal Medicine | Admitting: Internal Medicine

## 2021-07-22 DIAGNOSIS — I10 Essential (primary) hypertension: Secondary | ICD-10-CM | POA: Insufficient documentation

## 2021-07-22 DIAGNOSIS — I4821 Permanent atrial fibrillation: Secondary | ICD-10-CM | POA: Diagnosis not present

## 2021-07-22 DIAGNOSIS — L97812 Non-pressure chronic ulcer of other part of right lower leg with fat layer exposed: Secondary | ICD-10-CM | POA: Diagnosis not present

## 2021-07-22 DIAGNOSIS — Z87891 Personal history of nicotine dependence: Secondary | ICD-10-CM | POA: Insufficient documentation

## 2021-07-22 DIAGNOSIS — M1990 Unspecified osteoarthritis, unspecified site: Secondary | ICD-10-CM | POA: Diagnosis not present

## 2021-07-22 DIAGNOSIS — Z7901 Long term (current) use of anticoagulants: Secondary | ICD-10-CM | POA: Insufficient documentation

## 2021-07-22 DIAGNOSIS — G4733 Obstructive sleep apnea (adult) (pediatric): Secondary | ICD-10-CM | POA: Insufficient documentation

## 2021-07-22 DIAGNOSIS — I87311 Chronic venous hypertension (idiopathic) with ulcer of right lower extremity: Secondary | ICD-10-CM | POA: Diagnosis not present

## 2021-07-23 NOTE — Progress Notes (Signed)
RAYDEN, DOCK ([id number]) Visit Report for 07/22/2021 Abuse Risk Screen Details Patient Name: Date of Service: [name] 07/22/2021 1:15 PM Medical Record Number: [id number] Patient Account Number: [id number] Date of Birth/Sex: Treating RN: [birth year] (71 y.o. [name] Primary Care Saxon Crosby: [name] Other Clinician: Referring Brendan Gruwell: Treating Eldred Sooy/Extender: Delford Field in Treatment: 0 Abuse Risk Screen Items Answer ABUSE RISK SCREEN: Has anyone close to you tried to hurt or harm you recentlyo No Do you feel uncomfortable with anyone in your familyo No Has anyone forced you do things that you didnt want to doo No Electronic Signature(s) Signed: 07/22/2021 6:05:14 PM By: Lorrin Jackson Entered By: Lorrin Jackson on 07/22/2021 13:35:25 -------------------------------------------------------------------------------- Activities of Daily Living Details Patient Name: Date of Service: Blake Lull D. 07/22/2021 1:15 PM Medical Record Number: [id number] Patient Account Number: [id number] Date of Birth/Sex: Treating RN: 06-23-1950 (71 y.o. [name] Primary Care Birda Didonato: [name] Other Clinician: Referring Demarko Zeimet: Treating Aeris Hersman/Extender: Delford Field in Treatment: 0 Activities of Daily Living Items Answer Activities of Daily Living (Please select one for each item) Drive Automobile Need Assistance T Medications ake Completely Able Use T elephone Completely Able Care for Appearance Completely Able Use T oilet Completely Able Bath / Shower Completely Able Dress Self Completely Able Feed Self Completely Able Walk Completely Able Get In / Out Bed Completely Able Housework Need Assistance Prepare Meals Completely [place] Completely Able Shop for Self Need Assistance Electronic Signature(s) Signed: 07/22/2021 6:05:14 PM By: Lorrin Jackson Entered By: Lorrin Jackson on  07/22/2021 13:36:12 -------------------------------------------------------------------------------- Education Screening Details Patient Name: Date of Service: [name], Blake Husk D. 07/22/2021 1:15 PM Medical Record Number: [id number] Patient Account Number: [id number] Date of Birth/Sex: Treating RN: 1950/05/15 (71 y.o. [name] Primary Care Castor Gittleman: [name] Other Clinician: Referring Keric Zehren: Treating Aquil Duhe/Extender: Delford Field in Treatment: 0 Primary Learner Assessed: Patient Learning Preferences/Education Level/Primary Language Learning Preference: Explanation, Demonstration, Printed Material Highest Education Level: College or Above Preferred Language: English Cognitive Barrier Language Barrier: No Translator Needed: No Memory Deficit: No Emotional Barrier: No Cultural/Religious Beliefs Affecting Medical Care: No Physical Barrier Impaired Vision: No Impaired Hearing: No Decreased Hand dexterity: No Knowledge/Comprehension Knowledge Level: High Comprehension Level: High Ability to understand written instructions: High Ability to understand verbal instructions: High Motivation Anxiety Level: Calm Cooperation: Cooperative Education Importance: Acknowledges Need Interest in Health Problems: Asks Questions Perception: Coherent Willingness to Engage in Self-Management High Activities: Readiness to Engage in Self-Management High Activities: Electronic Signature(s) Signed: 07/22/2021 6:05:14 PM By: Lorrin Jackson Entered By: Lorrin Jackson on 07/22/2021 13:36:39 -------------------------------------------------------------------------------- Fall Risk Assessment Details Patient Name: Date of Service: [name], Blake Husk D. 07/22/2021 1:15 PM Medical Record Number: [id number] Patient Account Number: [id number] Date of Birth/Sex: Treating RN: 1950-06-08 (70 y.o. [name] Primary Care Indalecio Malmstrom: [name] Other  Clinician: Referring Jenavive Lamboy: Treating Jemimah Cressy/Extender: Delford Field in Treatment: 0 Fall Risk Assessment Items Have you had 2 or more falls in the last 12 monthso 0 No Have you had any fall that resulted in injury in the last 12 monthso 0 No FALLS RISK SCREEN History of falling - immediate or within 3 months 0 No Secondary diagnosis (Do you have 2 or more medical diagnoseso) 15 Yes Ambulatory aid None/bed rest/wheelchair/nurse 0 No Crutches/cane/walker 15 Yes Furniture 0 No Intravenous therapy Access/Saline/Heparin Lock 0 No Gait/Transferring Normal/ bed rest/  wheelchair 0 No Weak (short steps with or without shuffle, stooped but able to lift head while walking, may seek 10 Yes support from furniture) Impaired (short steps with shuffle, may have difficulty arising from chair, head down, impaired 0 No balance) Mental Status Oriented to own ability 0 Yes Electronic Signature(s) Signed: 07/22/2021 6:05:14 PM By: Lorrin Jackson Entered By: Lorrin Jackson on 07/22/2021 13:37:27 -------------------------------------------------------------------------------- Foot Assessment Details Patient Name: Date of Service: Blake Lull D. 07/22/2021 1:15 PM Medical Record Number: [id number] Patient Account Number: [id number] Date of Birth/Sex: Treating RN: 05/27/1950 (71 y.o. [name] Primary Care Janaisha Tolsma: [name] Other Clinician: Referring Tauno Falotico: Treating Gwenette Wellons/Extender: Delford Field in Treatment: 0 Foot Assessment Items Site Locations + = Sensation present, - = Sensation absent, C = Callus, U = Ulcer R = Redness, W = Warmth, M = Maceration, PU = Pre-ulcerative lesion F = Fissure, S = Swelling, D = Dryness Assessment Right: Left: Other Deformity: No No Prior Foot Ulcer: No No Prior Amputation: No No Charcot Joint: No No Ambulatory Status: Ambulatory With Help Assistance Device: Walker Gait:  Steady Electronic Signature(s) Signed: 07/22/2021 6:05:14 PM By: Lorrin Jackson Entered By: Lorrin Jackson on 07/22/2021 13:46:34 -------------------------------------------------------------------------------- Nutrition Risk Screening Details Patient Name: Date of Service: Blake Lull D. 07/22/2021 1:15 PM Medical Record Number: [id number] Patient Account Number: [id number] Date of Birth/Sex: Treating RN: 02-26-1951 (72 y.o. [name] Primary Care Kiowa Hollar: [name] Other Clinician: Referring Kele Barthelemy: Treating Lynze Reddy/Extender: Delford Field in Treatment: 0 Height (in): 69 Weight (lbs): 271 Body Mass Index (BMI): 40 Nutrition Risk Screening Items Score Screening NUTRITION RISK SCREEN: I have an illness or condition that made me change the kind and/or amount of food I eat 0 No I eat fewer than two meals per day 0 No I eat few fruits and vegetables, or milk products 0 No I have three or more drinks of beer, liquor or wine almost every day 0 No I have tooth or mouth problems that make it hard for me to eat 0 No I don't always have enough money to buy the food I need 0 No I eat alone most of the time 0 No I take three or more different prescribed or over-the-counter drugs a day 1 Yes Without wanting to, I have lost or gained 10 pounds in the last six months 0 No I am not always physically able to shop, cook and/or feed myself 0 No Nutrition Protocols Good Risk Protocol 0 No interventions needed Moderate Risk Protocol High Risk Proctocol Risk Level: Good Risk Score: 1 Electronic Signature(s) Signed: 07/22/2021 6:05:14 PM By: Lorrin Jackson Entered By: Lorrin Jackson on 07/22/2021 13:37:50

## 2021-07-23 NOTE — Progress Notes (Signed)
Blake, Kline ([id number]) Visit Report for 07/22/2021 Chief Complaint Document Details Patient Name: Date of Service: [name] 07/22/2021 1:15 PM Medical Record Number: [id number] Patient Account Number: [id number] Date of Birth/Sex: Treating RN: Jan 10, 1951 (71 y.o. [name] Primary Care Provider: [name] Other Clinician: Referring Provider: Treating Provider/Extender: Delford Field in Treatment: 0 Information Obtained from: Patient Chief Complaint 07/22/2021; right lower extremity wound Electronic Signature(s) Signed: 07/22/2021 3:07:31 PM By: Kalman Shan DO Entered By: Kalman Shan on 07/22/2021 14:52:24 -------------------------------------------------------------------------------- Debridement Details Patient Name: Date of Service: [name], Blake Husk D. 07/22/2021 1:15 PM Medical Record Number: [id number] Patient Account Number: [id number] Date of Birth/Sex: Treating RN: 12-25-1950 (71 y.o. [name] Primary Care Provider: [name] Other Clinician: Referring Provider: Treating Provider/Extender: Delford Field in Treatment: 0 Debridement Performed for Assessment: Wound #1 Right,Anterior Lower Leg Performed By: Physician Kalman Shan, DO Debridement Type: Debridement Severity of Tissue Pre Debridement: Fat layer exposed Level of Consciousness (Pre-procedure): Awake and Alert Pre-procedure Verification/Time Out Yes - 14:29 Taken: Start Time: 14:30 Pain Control: Other : Benzocaine T Area Debrided (L x W): otal 8 (cm) x 8 (cm) = 64 (cm) Tissue and other material debrided: Non-Viable, Slough, Subcutaneous, Slough Level: Skin/Subcutaneous Tissue Debridement Description: Excisional Instrument: Curette Bleeding: Minimum Hemostasis Achieved: Pressure End Time: 14:35 Response to Treatment: Procedure was tolerated well Level of Consciousness (Post- Awake and Alert procedure): Post  Debridement Measurements of Total Wound Length: (cm) 16 Width: (cm) 8 Depth: (cm) 0.1 Volume: (cm) 10.053 Character of Wound/Ulcer Post Debridement: Stable Severity of Tissue Post Debridement: Fat layer exposed Post Procedure Diagnosis Same as Pre-procedure Electronic Signature(s) Signed: 07/22/2021 3:07:31 PM By: Kalman Shan DO Signed: 07/22/2021 6:05:14 PM By: Lorrin Jackson Entered By: Lorrin Jackson on 07/22/2021 14:35:31 -------------------------------------------------------------------------------- HPI Details Patient Name: Date of Service: [name], Blake Husk D. 07/22/2021 1:15 PM Medical Record Number: [id number] Patient Account Number: [id number] Date of Birth/Sex: Treating RN: Dec 01, 1950 (71 y.o. [name] Primary Care Provider: [name] Other Clinician: Referring Provider: Treating Provider/Extender: Delford Field in Treatment: 0 History of Present Illness HPI Description: Admission 07/22/2021 [name] is a 71 year old male with a past medical history of OSA, aortic stenosis, permanent A-fib on Coumadin that presents to the clinic for a 7-week history of nonhealing ulcer to the right lower extremity. He states this wound developed spontaneously. He has been using Neosporin to the wound bed and keeping the area covered with absorbent pads and Kerlix. He denies signs of infection. He reports serous drainage. The only diurectic he takes is 12.5 mg of hydrochlorothiazide for his blood pressure. He does not wear daily compression stockings. Electronic Signature(s) Signed: 07/22/2021 3:07:31 PM By: Kalman Shan DO Entered By: Kalman Shan on 07/22/2021 14:57:24 -------------------------------------------------------------------------------- Physical Exam Details Patient Name: Date of Service: [name] D. 07/22/2021 1:15 PM Medical Record Number: [id number] Patient Account Number: [id number] Date of Birth/Sex: Treating  RN: 1950-08-22 (71 y.o. [name] Primary Care Provider: [name] Other Clinician: Referring Provider: Treating Provider/Extender: Delford Field in Treatment: 0 Constitutional respirations regular, non-labored and within target range for patient.. Cardiovascular 2+ dorsalis pedis/posterior tibialis pulses. Psychiatric pleasant and cooperative. Notes Right lower extremity: 2+ pitting edema to the knee. T the lateral aspect there is an open wound with nonviable tissue and granulation tissue. Serous drainage o noted. No surrounding signs of soft  tissue infection. Electronic Signature(s) Signed: 07/22/2021 3:07:31 PM By: Kalman Shan DO Entered By: Kalman Shan on 07/22/2021 14:57:55 -------------------------------------------------------------------------------- Physician Orders Details Patient Name: Date of Service: [name], Blake Husk D. 07/22/2021 1:15 PM Medical Record Number: [id number] Patient Account Number: [id number] Date of Birth/Sex: Treating RN: 1950-06-07 (71 y.o. [name] Primary Care Provider: [name] Other Clinician: Referring Provider: Treating Provider/Extender: Delford Field in Treatment: 0 Verbal / Phone Orders: No Diagnosis Coding ICD-10 Coding Code Description [phone] Non-pressure chronic ulcer of other part of right lower leg with fat layer exposed I87.311 Chronic venous hypertension (idiopathic) with ulcer of right lower extremity G47.33 Obstructive sleep apnea (adult) (pediatric) I48.21 Permanent atrial fibrillation Follow-up Appointments ppointment in 1 week. - 07/29/21 @ 11:00 with Dr. Heber [place] [name], Room 7) Return A Other: - Referral sent to [facility] for ABI/TBI studies-they will call to schedule an appointment Bathing/ Shower/ Hygiene May shower with protection but do not get wound dressing(s) wet. - Can use cast protector bag from [facility], [facility] or [employer] Edema Control -  Lymphedema / SCD / Other Elevate legs to the level of the heart or above for 30 minutes daily and/or when sitting, a frequency of: - throughout the day Avoid standing for long periods of time. Additional Orders / Instructions Follow Nutritious Diet Wound Treatment Wound #1 - Lower Leg Wound Laterality: Right, Anterior Cleanser: Soap and Water 1 x Per Week/30 Days Discharge Instructions: May shower and wash wound with dial antibacterial soap and water prior to dressing change. Cleanser: Wound Cleanser 1 x Per Week/30 Days Discharge Instructions: Cleanse the wound with wound cleanser prior to applying a clean dressing using gauze sponges, not tissue or cotton balls. Peri-Wound Care: Triamcinolone 15 (g) 1 x Per Week/30 Days Discharge Instructions: Use triamcinolone 15 (g) as directed Peri-Wound Care: Sween Lotion (Moisturizing lotion) 1 x Per Week/30 Days Discharge Instructions: Apply moisturizing lotion as directed Topical: Gentamicin 1 x Per Week/30 Days Discharge Instructions: As directed by physician Topical: Mupirocin Ointment 1 x Per Week/30 Days Discharge Instructions: Apply Mupirocin (Bactroban) as instructed Prim Dressing: Maxorb Extra Calcium Alginate Dressing, 4x4 in 1 x Per Week/30 Days ary Discharge Instructions: Apply calcium alginate to wound bed as instructed Secondary Dressing: ABD Pad, 8x10 1 x Per Week/30 Days Discharge Instructions: Apply over primary dressing as directed. Secondary Dressing: Zetuvit Plus 4x8 in 1 x Per Week/30 Days Discharge Instructions: Apply over primary dressing as directed. Compression Wrap: Kerlix Roll 4.5x3.1 (in/yd) 1 x Per Week/30 Days Discharge Instructions: Apply Kerlix and Coban compression as directed. Compression Wrap: Coban Self-Adherent Wrap 4x5 (in/yd) 1 x Per Week/30 Days Discharge Instructions: Apply over Kerlix as directed. Services and Therapies rterial Studies- Bilateral - Urgent Referral to [facility]: ABI with/without TBI- Leg Wound,  Edema - (ICD10 T61.443 - Non-pressure chronic ulcer Urgent A of other part of right lower leg with fat layer exposed) Electronic Signature(s) Signed: 07/22/2021 3:07:31 PM By: Kalman Shan DO Entered By: Kalman Shan on 07/22/2021 14:58:02 Prescription 07/22/2021 -------------------------------------------------------------------------------- [place] Scrape D. Kalman Shan DO Patient Name: Provider: 04-Nov-1950 [id number] Date of Birth: NPI#: Jerilynn Mages [id number] Sex: DEA #: [phone] Phone #: License #: [place] Patient Address: [address] [place], [place] [address] [place], [place] [address] [phone] Allergies Statins-Hmg-Coa Reductase Inhibitors; penicillin Provider's Orders rterial Studies- Bilateral - ICD10: L97.812 - Urgent Referral to [facility]: ABI with/without TBI- Leg Wound, Edema Urgent A  Hand Signature: Date(s): Electronic Signature(s) Signed: 07/22/2021 3:07:31 PM By: Kalman Shan DO Entered By: Kalman Shan on 07/22/2021 14:58:03 -------------------------------------------------------------------------------- Problem List Details Patient Name: Date of Service: [name], Blake Husk D. 07/22/2021 1:15 PM Medical Record Number: [id number] Patient Account Number: [id number] Date of Birth/Sex: Treating RN: November 13, 1950 (71 y.o. [name] Primary Care Provider: [name] Other Clinician: Referring Provider: Treating Provider/Extender: Delford Field in Treatment: 0 Active Problems ICD-10 Encounter Code Description Active Date MDM Diagnosis [phone] Non-pressure chronic ulcer of other part of right lower leg with fat layer 07/22/2021 No Yes exposed I87.311 Chronic venous hypertension (idiopathic) with ulcer of right lower extremity 07/22/2021 No Yes G47.33 Obstructive sleep apnea (adult) (pediatric) 07/22/2021 No Yes I48.21 Permanent  atrial fibrillation 07/22/2021 No Yes Inactive Problems Resolved Problems Electronic Signature(s) Signed: 07/22/2021 3:07:31 PM By: Kalman Shan DO Entered By: Kalman Shan on 07/22/2021 14:52:02 -------------------------------------------------------------------------------- Progress Note Details Patient Name: Date of Service: [name], Blake Husk D. 07/22/2021 1:15 PM Medical Record Number: [id number] Patient Account Number: [id number] Date of Birth/Sex: Treating RN: [birth year] (71 y.o. [name] Primary Care Provider: [name] Other Clinician: Referring Provider: Treating Provider/Extender: Delford Field in Treatment: 0 Subjective Chief Complaint Information obtained from Patient 07/22/2021; right lower extremity wound History of Present Illness (HPI) Admission 07/22/2021 [name] is a 71 year old male with a past medical history of OSA, aortic stenosis, permanent A-fib on Coumadin that presents to the clinic for a 7-week history of nonhealing ulcer to the right lower extremity. He states this wound developed spontaneously. He has been using Neosporin to the wound bed and keeping the area covered with absorbent pads and Kerlix. He denies signs of infection. He reports serous drainage. The only diurectic he takes is 12.5 mg of hydrochlorothiazide for his blood pressure. He does not wear daily compression stockings. Patient History Information obtained from Patient, Chart. Allergies Statins-Hmg-Coa Reductase Inhibitors, penicillin Family History Heart Disease - Father, Hypertension - Father, Stroke - Father, No family history of Cancer, Diabetes, Hereditary Spherocytosis, Kidney Disease, Lung Disease, Seizures, Thyroid Problems, Tuberculosis. Social History Former smoker, Marital Status - Single, Alcohol Use - Never, Drug Use - No History, Caffeine Use - Never. Medical History Respiratory Patient has history of Sleep  Apnea Cardiovascular Patient has history of Arrhythmia, Hypertension, Peripheral Venous Disease Musculoskeletal Patient has history of Osteoarthritis Medical A Surgical History Notes nd Cardiovascular Aortic Stenosis-Mitral Valve Regurg. Review of Systems (ROS) Eyes Denies complaints or symptoms of Dry Eyes, Vision Changes, Glasses / Contacts. Ear/Nose/Mouth/Throat Denies complaints or symptoms of Chronic sinus problems or rhinitis. Gastrointestinal Denies complaints or symptoms of Frequent diarrhea, Nausea, Vomiting. Endocrine Denies complaints or symptoms of Heat/cold intolerance. Genitourinary Denies complaints or symptoms of Frequent urination. Integumentary (Skin) Complains or has symptoms of Wounds. Musculoskeletal Denies complaints or symptoms of Muscle Pain, Muscle Weakness. Neurologic Denies complaints or symptoms of Numbness/parasthesias. Psychiatric Denies complaints or symptoms of Claustrophobia, Suicidal. Objective Constitutional respirations regular, non-labored and within target range for patient.. Vitals Time Taken: 1:30 PM, Height: 69 in, Source: Stated, Weight: 271 lbs, Source: Stated, BMI: 40, Temperature: 98.2 F, Pulse: 66 bpm, Respiratory Rate: 18 breaths/min, Blood Pressure: 143/86 mmHg. Cardiovascular 2+ dorsalis pedis/posterior tibialis pulses. Psychiatric pleasant and cooperative. General Notes: Right lower extremity: 2+ pitting edema to the knee. T the lateral aspect there is an open wound with nonviable tissue and granulation tissue. o Serous drainage noted. No surrounding signs of soft tissue infection. Integumentary (  Hair, Skin) Wound #1 status is Open. Original cause of wound was Gradually Appeared. The date acquired was: 07/01/2021. The wound is located on the Right,Anterior Lower Leg. The wound measures 16cm length x 8cm width x 0.1cm depth; 100.531cm^2 area and 10.053cm^3 volume. There is Fat Layer (Subcutaneous Tissue) exposed. There is no  tunneling or undermining noted. There is a medium amount of serous drainage noted. The wound margin is distinct with the outline attached to the wound base. There is large (67-100%) red, pink granulation within the wound bed. There is a small (1-33%) amount of necrotic tissue within the wound bed including Adherent Slough. Assessment Active Problems ICD-10 Non-pressure chronic ulcer of other part of right lower leg with fat layer exposed Chronic venous hypertension (idiopathic) with ulcer of right lower extremity Obstructive sleep apnea (adult) (pediatric) Permanent atrial fibrillation Patient presents with a 7-week history of nonhealing ulcer to the lower lateral right leg. He has signs of venous insufficiency on exam. His ABIs in office were 0.28. He does have pulses that we can feel and hear on Doppler. He reports no pain to the wound sites. This reading my be inaccurate. We will order formal ABIs with TBI's. There does appear to be significant bioburden on exam. I debrided nonviable tissue. At this time I recommended mupirocin ointment and gentamicin to address this along with calcium alginate under Kerlix/Coban. He knows to not get the wrap wet or keep this on for more than 7 days. Follow- up in 1 week. 47 minutes was spent on the encounter including face-to-face, EMR review and coordination of care Procedures Wound #1 Pre-procedure diagnosis of Wound #1 is a Venous Leg Ulcer located on the Right,Anterior Lower Leg .Severity of Tissue Pre Debridement is: Fat layer exposed. There was a Excisional Skin/Subcutaneous Tissue Debridement with a total area of 64 sq cm performed by Kalman Shan, DO. With the following instrument(s): Curette to remove Non-Viable tissue/material. Material removed includes Subcutaneous Tissue and Slough and after achieving pain control using Other (Benzocaine). No specimens were taken. A time out was conducted at 14:29, prior to the start of the procedure. A  Minimum amount of bleeding was controlled with Pressure. The procedure was tolerated well. Post Debridement Measurements: 16cm length x 8cm width x 0.1cm depth; 10.053cm^3 volume. Character of Wound/Ulcer Post Debridement is stable. Severity of Tissue Post Debridement is: Fat layer exposed. Post procedure Diagnosis Wound #1: Same as Pre-Procedure Plan Follow-up Appointments: Return Appointment in 1 week. - 07/29/21 @ 11:00 with Dr. Heber [place] [name], Room 7) Other: - Referral sent to [facility] for ABI/TBI studies-they will call to schedule an appointment Bathing/ Shower/ Hygiene: May shower with protection but do not get wound dressing(s) wet. - Can use cast protector bag from [facility], [facility] or [employer] Edema Control - Lymphedema / SCD / Other: Elevate legs to the level of the heart or above for 30 minutes daily and/or when sitting, a frequency of: - throughout the day Avoid standing for long periods of time. Additional Orders / Instructions: Follow Nutritious Diet Services and Therapies ordered were: Urgent Arterial Studies- Bilateral - Urgent Referral to [facility]: ABI with/without TBI- Leg Wound, Edema WOUND #1: - Lower Leg Wound Laterality: Right, Anterior Cleanser: Soap and Water 1 x Per Week/30 Days Discharge Instructions: May shower and wash wound with dial antibacterial soap and water prior to dressing change. Cleanser: Wound Cleanser 1 x Per Week/30 Days Discharge Instructions: Cleanse the wound with wound cleanser prior to applying a clean dressing using gauze sponges, not  tissue or cotton balls. Peri-Wound Care: Triamcinolone 15 (g) 1 x Per Week/30 Days Discharge Instructions: Use triamcinolone 15 (g) as directed Peri-Wound Care: Sween Lotion (Moisturizing lotion) 1 x Per Week/30 Days Discharge Instructions: Apply moisturizing lotion as directed Topical: Gentamicin 1 x Per Week/30 Days Discharge Instructions: As directed by physician Topical: Mupirocin Ointment 1 x Per Week/30 Days Discharge  Instructions: Apply Mupirocin (Bactroban) as instructed Prim Dressing: Maxorb Extra Calcium Alginate Dressing, 4x4 in 1 x Per Week/30 Days ary Discharge Instructions: Apply calcium alginate to wound bed as instructed Secondary Dressing: ABD Pad, 8x10 1 x Per Week/30 Days Discharge Instructions: Apply over primary dressing as directed. Secondary Dressing: Zetuvit Plus 4x8 in 1 x Per Week/30 Days Discharge Instructions: Apply over primary dressing as directed. Com pression Wrap: Kerlix Roll 4.5x3.1 (in/yd) 1 x Per Week/30 Days Discharge Instructions: Apply Kerlix and Coban compression as directed. Com pression Wrap: Coban Self-Adherent Wrap 4x5 (in/yd) 1 x Per Week/30 Days Discharge Instructions: Apply over Kerlix as directed. 1. In office sharp debridement 2. Mupirocin ointment and gentamicin with calcium alginate under Kerlix/Coban 3. ABIs with TBI's 4. Follow-up in 1 week Electronic Signature(s) Signed: 07/22/2021 3:07:31 PM By: Kalman Shan DO Entered By: Kalman Shan on 07/22/2021 15:07:13 -------------------------------------------------------------------------------- HxROS Details Patient Name: Date of Service: [name], Blake Husk D. 07/22/2021 1:15 PM Medical Record Number: [id number] Patient Account Number: [id number] Date of Birth/Sex: Treating RN: 1950-04-30 (71 y.o. [name] Primary Care Provider: [name] Other Clinician: Referring Provider: Treating Provider/Extender: Delford Field in Treatment: 0 Information Obtained From Patient Chart Eyes Complaints and Symptoms: Negative for: Dry Eyes; Vision Changes; Glasses / Contacts Ear/Nose/Mouth/Throat Complaints and Symptoms: Negative for: Chronic sinus problems or rhinitis Gastrointestinal Complaints and Symptoms: Negative for: Frequent diarrhea; Nausea; Vomiting Endocrine Complaints and Symptoms: Negative for: Heat/cold intolerance Genitourinary Complaints and  Symptoms: Negative for: Frequent urination Integumentary (Skin) Complaints and Symptoms: Positive for: Wounds Musculoskeletal Complaints and Symptoms: Negative for: Muscle Pain; Muscle Weakness Medical History: Positive for: Osteoarthritis Neurologic Complaints and Symptoms: Negative for: Numbness/parasthesias Psychiatric Complaints and Symptoms: Negative for: Claustrophobia; Suicidal Hematologic/Lymphatic Respiratory Medical History: Positive for: Sleep Apnea Cardiovascular Medical History: Positive for: Arrhythmia; Hypertension; Peripheral Venous Disease Past Medical History Notes: Aortic Stenosis-Mitral Valve Regurg. Immunological Oncologic Immunizations Pneumococcal Vaccine: Received Pneumococcal Vaccination: No Implantable Devices None Family and Social History Cancer: No; Diabetes: No; Heart Disease: Yes - Father; Hereditary Spherocytosis: No; Hypertension: Yes - Father; Kidney Disease: No; Lung Disease: No; Seizures: No; Stroke: Yes - Father; Thyroid Problems: No; Tuberculosis: No; Former smoker; Marital Status - Single; Alcohol Use: Never; Drug Use: No History; Caffeine Use: Never; Financial Concerns: No; Food, Clothing or Shelter Needs: No; Support System Lacking: No; Transportation Concerns: No Electronic Signature(s) Signed: 07/22/2021 3:07:31 PM By: Kalman Shan DO Signed: 07/22/2021 6:05:14 PM By: Lorrin Jackson Entered By: Lorrin Jackson on 07/22/2021 13:35:18 -------------------------------------------------------------------------------- SuperBill Details Patient Name: Date of Service: [name], Blake Husk D. 07/22/2021 Medical Record Number: [id number] Patient Account Number: [id number] Date of Birth/Sex: Treating RN: [birth year] (71 y.o. [name] Primary Care Provider: [name] Other Clinician: Referring Provider: Treating Provider/Extender: Delford Field in Treatment: 0 Diagnosis Coding ICD-10 Codes Code  Description [phone] Non-pressure chronic ulcer of other part of right lower leg with fat layer exposed I87.311 Chronic venous hypertension (idiopathic) with ulcer of right lower extremity G47.33 Obstructive sleep apnea (adult) (pediatric) I48.21 Permanent atrial fibrillation Facility Procedures CPT4 Code: [id number] Description: [phone] - WOUND  CARE VISIT-LEV 3 EST PT Modifier: 25 Quantity: 1 CPT4 Code: [id number] Description: 91478 - DEB SUBQ TISSUE 20 SQ CM/< ICD-10 Diagnosis Description G95.621 Non-pressure chronic ulcer of other part of right lower leg with fat layer expo Modifier: sed Quantity: 1 CPT4 Code: [id number] Description: 69629 - DEB SUBQ TISS EA ADDL 20CM ICD-10 Diagnosis Description B28.413 Non-pressure chronic ulcer of other part of right lower leg with fat layer expo Modifier: sed Quantity: 3 Physician Procedures : CPT4 Code Description Modifier [id number] 72536 - WC PHYS LEVEL 4 - NEW PT ICD-10 Diagnosis Description U44.034 Non-pressure chronic ulcer of other part of right lower leg with fat layer exposed I87.311 Chronic venous hypertension (idiopathic) with ulcer  of right lower extremity G47.33 Obstructive sleep apnea (adult) (pediatric) I48.21 Permanent atrial fibrillation Quantity: 1 : [phone] 11042 - WC PHYS SUBQ TISS 20 SQ CM ICD-10 Diagnosis Description L97.812 Non-pressure chronic ulcer of other part of right lower leg with fat layer exposed Quantity: 1 : [phone] 11045 - WC PHYS SUBQ TISS EA ADDL 20 CM ICD-10 Diagnosis Description P29.518 Non-pressure chronic ulcer of other part of right lower leg with fat layer exposed Quantity: 3 Electronic Signature(s) Signed: 07/22/2021 3:07:31 PM By: Kalman Shan DO Entered By: Kalman Shan on 07/22/2021 15:07:20

## 2021-07-26 ENCOUNTER — Ambulatory Visit (HOSPITAL_COMMUNITY)
Admission: RE | Admit: 2021-07-26 | Discharge: 2021-07-26 | Disposition: A | Discharge status: Home / Self Care | Payer: PPO | Source: Ambulatory Visit | Attending: Internal Medicine | Admitting: Internal Medicine

## 2021-07-26 ENCOUNTER — Other Ambulatory Visit (HOSPITAL_COMMUNITY): Payer: Self-pay | Admitting: Internal Medicine

## 2021-07-26 DIAGNOSIS — L97812 Non-pressure chronic ulcer of other part of right lower leg with fat layer exposed: Secondary | ICD-10-CM | POA: Insufficient documentation

## 2021-07-29 ENCOUNTER — Encounter (HOSPITAL_BASED_OUTPATIENT_CLINIC_OR_DEPARTMENT_OTHER): Payer: PPO | Admitting: Internal Medicine

## 2021-07-29 DIAGNOSIS — L97812 Non-pressure chronic ulcer of other part of right lower leg with fat layer exposed: Secondary | ICD-10-CM

## 2021-07-29 NOTE — Progress Notes (Signed)
ACEY, WOODFIELD ([id number]) Visit Report for 07/29/2021 Chief Complaint Document Details Patient Name: Date of Service: Blake Lull D. 07/29/2021 11:00 A M Medical Record Number: [id number] Patient Account Number: [id number] Date of Birth/Sex: Treating RN: 09-20-1950 (71 y.o. [name] Primary Care Provider: [name] Other Clinician: Referring Provider: Treating Provider/Extender: Delford Field in Treatment: 1 Information Obtained from: Patient Chief Complaint 07/22/2021; right lower extremity wound Electronic Signature(s) Signed: 07/29/2021 4:29:03 PM By: Kalman Shan DO Entered By: Kalman Shan on 07/29/2021 16:21:19 -------------------------------------------------------------------------------- Debridement Details Patient Name: Date of Service: Blake Lull D. 07/29/2021 11:00 A M Medical Record Number: [id number] Patient Account Number: [id number] Date of Birth/Sex: Treating RN: January 31, 1951 (71 y.o. [name] Primary Care Provider: [name] Other Clinician: Referring Provider: Treating Provider/Extender: Delford Field in Treatment: 1 Debridement Performed for Assessment: Wound #1 Right,Anterior Lower Leg Performed By: Physician Kalman Shan, DO Debridement Type: Debridement Severity of Tissue Pre Debridement: Fat layer exposed Level of Consciousness (Pre-procedure): Awake and Alert Pre-procedure Verification/Time Out Yes - 11:54 Taken: Start Time: 11:55 Pain Control: Other : Benzocaine T Area Debrided (L x W): otal 8.1 (cm) x 4.4 (cm) = 35.64 (cm) Tissue and other material debrided: Non-Viable, Slough, Subcutaneous, Slough Level: Skin/Subcutaneous Tissue Debridement Description: Excisional Instrument: Curette Bleeding: Minimum Hemostasis Achieved: Pressure Response to Treatment: Procedure was tolerated well Level of Consciousness (Post- Awake and Alert procedure): Post Debridement  Measurements of Total Wound Length: (cm) 8.1 Width: (cm) 4.4 Depth: (cm) 0.1 Volume: (cm) 2.799 Character of Wound/Ulcer Post Debridement: Stable Severity of Tissue Post Debridement: Fat layer exposed Post Procedure Diagnosis Same as Pre-procedure Electronic Signature(s) Signed: 07/29/2021 4:29:03 PM By: Kalman Shan DO Signed: 07/29/2021 4:54:47 PM By: Lorrin Jackson Entered By: Lorrin Jackson on 07/29/2021 12:05:02 -------------------------------------------------------------------------------- HPI Details Patient Name: Date of Service: [name], Blake Husk D. 07/29/2021 11:00 A M Medical Record Number: [id number] Patient Account Number: [id number] Date of Birth/Sex: Treating RN: [birth year] (71 y.o. [name] Primary Care Provider: [name] Other Clinician: Referring Provider: Treating Provider/Extender: Delford Field in Treatment: 1 History of Present Illness HPI Description: Admission 07/22/2021 [name] is a 71 year old male with a past medical history of OSA, aortic stenosis, permanent A-fib on Coumadin that presents to the clinic for a 7-week history of nonhealing ulcer to the right lower extremity. He states this wound developed spontaneously. He has been using Neosporin to the wound bed and keeping the area covered with absorbent pads and Kerlix. He denies signs of infection. He reports serous drainage. The only diurectic he takes is 12.5 mg of hydrochlorothiazide for his blood pressure. He does not wear daily compression stockings. 5/25; patient presents for follow-up. He tolerated the compression wrap well. We have been using antibiotic ointment with calcium alginate under Kerlix/Coban. He is obtaining his ABIs next week. He has no issues or complaints today. Electronic Signature(s) Signed: 07/29/2021 4:29:03 PM By: Kalman Shan DO Entered By: Kalman Shan on 07/29/2021  16:22:26 -------------------------------------------------------------------------------- Physical Exam Details Patient Name: Date of Service: Blake Lull D. 07/29/2021 11:00 A M Medical Record Number: [id number] Patient Account Number: [id number] Date of Birth/Sex: Treating RN: 1950-12-08 (71 y.o. [name] Primary Care Provider: [name] Other Clinician: Referring Provider: Treating Provider/Extender: Delford Field in Treatment: 1 Constitutional respirations regular, non-labored and within target range for patient.. Cardiovascular 2+ dorsalis pedis/posterior tibialis pulses. Psychiatric  pleasant and cooperative. Notes Right lower extremity: 2+ pitting edema to the knee. T the lateral aspect there is an open wound with nonviable tissue and granulation tissue. No surrounding o signs of soft tissue infection. Electronic Signature(s) Signed: 07/29/2021 4:29:03 PM By: Kalman Shan DO Entered By: Kalman Shan on 07/29/2021 16:25:02 -------------------------------------------------------------------------------- Physician Orders Details Patient Name: Date of Service: [name], Blake Husk D. 07/29/2021 11:00 A M Medical Record Number: [id number] Patient Account Number: [id number] Date of Birth/Sex: Treating RN: September 25, 1950 (71 y.o. [name] Primary Care Provider: [name] Other Clinician: Referring Provider: Treating Provider/Extender: Delford Field in Treatment: 1 Verbal / Phone Orders: No Diagnosis Coding ICD-10 Coding Code Description [id number] Non-pressure chronic ulcer of other part of right lower leg with fat layer exposed I87.311 Chronic venous hypertension (idiopathic) with ulcer of right lower extremity G47.33 Obstructive sleep apnea (adult) (pediatric) I48.21 Permanent atrial fibrillation Follow-up Appointments ppointment in 1 week. - 08/06/21 @ 11:00 with Dr. Heber Hooper Leveda Anna, Room 7) Return  A Bathing/ Shower/ Hygiene May shower with protection but do not get wound dressing(s) wet. - Can use cast protector bag from [facility], [facility] or [employer] Edema Control - Lymphedema / SCD / Other Elevate legs to the level of the heart or above for 30 minutes daily and/or when sitting, a frequency of: - throughout the day Avoid standing for long periods of time. Additional Orders / Instructions Follow Nutritious Diet Wound Treatment Wound #1 - Lower Leg Wound Laterality: Right, Anterior Cleanser: Soap and Water 1 x Per Week/30 Days Discharge Instructions: May shower and wash wound with dial antibacterial soap and water prior to dressing change. Cleanser: Wound Cleanser 1 x Per Week/30 Days Discharge Instructions: Cleanse the wound with wound cleanser prior to applying a clean dressing using gauze sponges, not tissue or cotton balls. Peri-Wound Care: Triamcinolone 15 (g) 1 x Per Week/30 Days Discharge Instructions: Use triamcinolone 15 (g) as directed Peri-Wound Care: Sween Lotion (Moisturizing lotion) 1 x Per Week/30 Days Discharge Instructions: Apply moisturizing lotion as directed Topical: Gentamicin 1 x Per Week/30 Days Discharge Instructions: As directed by physician Topical: Mupirocin Ointment 1 x Per Week/30 Days Discharge Instructions: Apply Mupirocin (Bactroban) as instructed Prim Dressing: Maxorb Extra Calcium Alginate Dressing, 4x4 in 1 x Per Week/30 Days ary Discharge Instructions: Apply calcium alginate to wound bed as instructed Secondary Dressing: ABD Pad, 8x10 1 x Per Week/30 Days Discharge Instructions: Apply over primary dressing as directed. Secondary Dressing: Zetuvit Plus 4x8 in 1 x Per Week/30 Days Discharge Instructions: Apply over primary dressing as directed. Compression Wrap: Kerlix Roll 4.5x3.1 (in/yd) 1 x Per Week/30 Days Discharge Instructions: Apply Kerlix and Coban compression as directed. Compression Wrap: Coban Self-Adherent Wrap 4x5 (in/yd) 1 x Per Week/30  Days Discharge Instructions: Apply over Kerlix as directed. Electronic Signature(s) Signed: 07/29/2021 4:29:03 PM By: Kalman Shan DO Entered By: Kalman Shan on 07/29/2021 16:23:26 -------------------------------------------------------------------------------- Problem List Details Patient Name: Date of Service: [name], Blake Husk D. 07/29/2021 11:00 A M Medical Record Number: [id number] Patient Account Number: [id number] Date of Birth/Sex: Treating RN: [birth year] (71 y.o. [name] Primary Care Provider: [name] Other Clinician: Referring Provider: Treating Provider/Extender: Delford Field in Treatment: 1 Active Problems ICD-10 Encounter Code Description Active Date MDM Diagnosis [phone] Non-pressure chronic ulcer of other part of right lower leg with fat layer 07/22/2021 No Yes exposed I87.311 Chronic venous hypertension (idiopathic) with ulcer of right lower extremity 07/22/2021 No Yes G47.33 Obstructive  sleep apnea (adult) (pediatric) 07/22/2021 No Yes I48.21 Permanent atrial fibrillation 07/22/2021 No Yes Inactive Problems Resolved Problems Electronic Signature(s) Signed: 07/29/2021 4:29:03 PM By: Kalman Shan DO Entered By: Kalman Shan on 07/29/2021 16:21:06 -------------------------------------------------------------------------------- Progress Note Details Patient Name: Date of Service: Blake Lull D. 07/29/2021 11:00 A M Medical Record Number: [id number] Patient Account Number: [id number] Date of Birth/Sex: Treating RN: Jan 06, 1951 (71 y.o. [name] Primary Care Provider: [name] Other Clinician: Referring Provider: Treating Provider/Extender: Delford Field in Treatment: 1 Subjective Chief Complaint Information obtained from Patient 07/22/2021; right lower extremity wound History of Present Illness (HPI) Admission 07/22/2021 [name] is a 71 year old male with a past  medical history of OSA, aortic stenosis, permanent A-fib on Coumadin that presents to the clinic for a 7-week history of nonhealing ulcer to the right lower extremity. He states this wound developed spontaneously. He has been using Neosporin to the wound bed and keeping the area covered with absorbent pads and Kerlix. He denies signs of infection. He reports serous drainage. The only diurectic he takes is 12.5 mg of hydrochlorothiazide for his blood pressure. He does not wear daily compression stockings. 5/25; patient presents for follow-up. He tolerated the compression wrap well. We have been using antibiotic ointment with calcium alginate under Kerlix/Coban. He is obtaining his ABIs next week. He has no issues or complaints today. Patient History Information obtained from Patient, Chart. Family History Heart Disease - Father, Hypertension - Father, Stroke - Father, No family history of Cancer, Diabetes, Hereditary Spherocytosis, Kidney Disease, Lung Disease, Seizures, Thyroid Problems, Tuberculosis. Social History Former smoker, Marital Status - Single, Alcohol Use - Never, Drug Use - No History, Caffeine Use - Never. Medical History Respiratory Patient has history of Sleep Apnea Cardiovascular Patient has history of Arrhythmia, Hypertension, Peripheral Venous Disease Musculoskeletal Patient has history of Osteoarthritis Medical A Surgical History Notes nd Cardiovascular Aortic Stenosis-Mitral Valve Regurg. Objective Constitutional respirations regular, non-labored and within target range for patient.. Vitals Time Taken: 11:29 AM, Height: 69 in, Weight: 271 lbs, BMI: 40, Temperature: 97.9 F, Pulse: 51 bpm, Respiratory Rate: 18 breaths/min, Blood Pressure: 144/81 mmHg. Cardiovascular 2+ dorsalis pedis/posterior tibialis pulses. Psychiatric pleasant and cooperative. General Notes: Right lower extremity: 2+ pitting edema to the knee. T the lateral aspect there is an open wound with  nonviable tissue and granulation tissue. o No surrounding signs of soft tissue infection. Integumentary (Hair, Skin) Wound #1 status is Open. Original cause of wound was Gradually Appeared. The date acquired was: 07/01/2021. The wound has been in treatment 1 weeks. The wound is located on the Right,Anterior Lower Leg. The wound measures 8.1cm length x 4.4cm width x 0.1cm depth; 27.992cm^2 area and 2.799cm^3 volume. There is Fat Layer (Subcutaneous Tissue) exposed. There is no tunneling or undermining noted. There is a medium amount of serous drainage noted. The wound margin is distinct with the outline attached to the wound base. There is large (67-100%) red, pink granulation within the wound bed. There is a small (1- 33%) amount of necrotic tissue within the wound bed including Adherent Slough. Assessment Active Problems ICD-10 Non-pressure chronic ulcer of other part of right lower leg with fat layer exposed Chronic venous hypertension (idiopathic) with ulcer of right lower extremity Obstructive sleep apnea (adult) (pediatric) Permanent atrial fibrillation Patient's wound has shown improvement in size and appearance since last clinic visit. I debrided nonviable tissue. I recommended continuing the course mupirocin/gentamicin ointment with calcium alginate under Kerlix/Coban. Follow-up in  1 week. Procedures Wound #1 Pre-procedure diagnosis of Wound #1 is a Venous Leg Ulcer located on the Right,Anterior Lower Leg .Severity of Tissue Pre Debridement is: Fat layer exposed. There was a Excisional Skin/Subcutaneous Tissue Debridement with a total area of 35.64 sq cm performed by Kalman Shan, DO. With the following instrument(s): Curette to remove Non-Viable tissue/material. Material removed includes Subcutaneous Tissue and Slough and after achieving pain control using Other (Benzocaine). No specimens were taken. A time out was conducted at 11:54, prior to the start of the procedure. A Minimum  amount of bleeding was controlled with Pressure. The procedure was tolerated well. Post Debridement Measurements: 8.1cm length x 4.4cm width x 0.1cm depth; 2.799cm^3 volume. Character of Wound/Ulcer Post Debridement is stable. Severity of Tissue Post Debridement is: Fat layer exposed. Post procedure Diagnosis Wound #1: Same as Pre-Procedure Plan Follow-up Appointments: Return Appointment in 1 week. - 08/06/21 @ 11:00 with Dr. Heber Colfax Leveda Anna, Room 7) Bathing/ Shower/ Hygiene: May shower with protection but do not get wound dressing(s) wet. - Can use cast protector bag from [facility], [facility] or [employer] Edema Control - Lymphedema / SCD / Other: Elevate legs to the level of the heart or above for 30 minutes daily and/or when sitting, a frequency of: - throughout the day Avoid standing for long periods of time. Additional Orders / Instructions: Follow Nutritious Diet WOUND #1: - Lower Leg Wound Laterality: Right, Anterior Cleanser: Soap and Water 1 x Per Week/30 Days Discharge Instructions: May shower and wash wound with dial antibacterial soap and water prior to dressing change. Cleanser: Wound Cleanser 1 x Per Week/30 Days Discharge Instructions: Cleanse the wound with wound cleanser prior to applying a clean dressing using gauze sponges, not tissue or cotton balls. Peri-Wound Care: Triamcinolone 15 (g) 1 x Per Week/30 Days Discharge Instructions: Use triamcinolone 15 (g) as directed Peri-Wound Care: Sween Lotion (Moisturizing lotion) 1 x Per Week/30 Days Discharge Instructions: Apply moisturizing lotion as directed Topical: Gentamicin 1 x Per Week/30 Days Discharge Instructions: As directed by physician Topical: Mupirocin Ointment 1 x Per Week/30 Days Discharge Instructions: Apply Mupirocin (Bactroban) as instructed Prim Dressing: Maxorb Extra Calcium Alginate Dressing, 4x4 in 1 x Per Week/30 Days ary Discharge Instructions: Apply calcium alginate to wound bed as instructed Secondary Dressing:  ABD Pad, 8x10 1 x Per Week/30 Days Discharge Instructions: Apply over primary dressing as directed. Secondary Dressing: Zetuvit Plus 4x8 in 1 x Per Week/30 Days Discharge Instructions: Apply over primary dressing as directed. Com pression Wrap: Kerlix Roll 4.5x3.1 (in/yd) 1 x Per Week/30 Days Discharge Instructions: Apply Kerlix and Coban compression as directed. Com pression Wrap: Coban Self-Adherent Wrap 4x5 (in/yd) 1 x Per Week/30 Days Discharge Instructions: Apply over Kerlix as directed. 1. In office sharp debridement 2. Antibiotic ointment with calcium alginate under Kerlix/Coban 3. Follow-up in 1 week Electronic Signature(s) Signed: 07/29/2021 4:29:03 PM By: Kalman Shan DO Entered By: Kalman Shan on 07/29/2021 16:25:18 -------------------------------------------------------------------------------- HxROS Details Patient Name: Date of Service: [name], Blake Husk D. 07/29/2021 11:00 A M Medical Record Number: [id number] Patient Account Number: [id number] Date of Birth/Sex: Treating RN: 12-23-1950 (71 y.o. [name] Primary Care Provider: [name] Other Clinician: Referring Provider: Treating Provider/Extender: Delford Field in Treatment: 1 Information Obtained From Patient Chart Respiratory Medical History: Positive for: Sleep Apnea Cardiovascular Medical History: Positive for: Arrhythmia; Hypertension; Peripheral Venous Disease Past Medical History Notes: Aortic Stenosis-Mitral Valve Regurg. Musculoskeletal Medical History: Positive for: Osteoarthritis Immunizations Pneumococcal Vaccine: Received Pneumococcal Vaccination:  No Implantable Devices None Family and Social History Cancer: No; Diabetes: No; Heart Disease: Yes - Father; Hereditary Spherocytosis: No; Hypertension: Yes - Father; Kidney Disease: No; Lung Disease: No; Seizures: No; Stroke: Yes - Father; Thyroid Problems: No; Tuberculosis: No; Former smoker; Marital Status  - Single; Alcohol Use: Never; Drug Use: No History; Caffeine Use: Never; Financial Concerns: No; Food, Clothing or Shelter Needs: No; Support System Lacking: No; Transportation Concerns: No Electronic Signature(s) Signed: 07/29/2021 4:29:03 PM By: Kalman Shan DO Signed: 07/29/2021 4:54:47 PM By: Lorrin Jackson Entered By: Kalman Shan on 07/29/2021 16:22:32 -------------------------------------------------------------------------------- SuperBill Details Patient Name: Date of Service: Blake Lull D. 07/29/2021 Medical Record Number: [id number] Patient Account Number: [id number] Date of Birth/Sex: Treating RN: [birth year] (71 y.o. [name] Primary Care Provider: [name] Other Clinician: Referring Provider: Treating Provider/Extender: Delford Field in Treatment: 1 Diagnosis Coding ICD-10 Codes Code Description [phone] Non-pressure chronic ulcer of other part of right lower leg with fat layer exposed I87.311 Chronic venous hypertension (idiopathic) with ulcer of right lower extremity G47.33 Obstructive sleep apnea (adult) (pediatric) I48.21 Permanent atrial fibrillation Facility Procedures CPT4 Code: [id number] Description: 70786 - DEB SUBQ TISSUE 20 SQ CM/< ICD-10 Diagnosis Description L97.812 Non-pressure chronic ulcer of other part of right lower leg with fat layer exp Modifier: osed Quantity: 1 CPT4 Code: [id number] L Description: 00712 - DEB SUBQ TISS EA ADDL 20CM ICD-10 Diagnosis Description 97.812 Non-pressure chronic ulcer of other part of right lower leg with fat layer exposed Modifier: Quantity: 1 Physician Procedures : CPT4 Code Description Modifier [id number] 25498 - WC PHYS SUBQ TISS 20 SQ CM ICD-10 Diagnosis Description L97.812 Non-pressure chronic ulcer of other part of right lower leg with fat layer exposed Quantity: 1 : [id number] 09407 - WC PHYS SUBQ TISS EA ADDL 20 CM ICD-10 Diagnosis Description W80.881 Non-pressure chronic  ulcer of other part of right lower leg with fat layer exposed Quantity: 1 Electronic Signature(s) Signed: 07/29/2021 4:29:03 PM By: Kalman Shan DO Entered By: Kalman Shan on 07/29/2021 16:28:31

## 2021-08-03 ENCOUNTER — Encounter: Payer: Self-pay | Admitting: Family Medicine

## 2021-08-05 ENCOUNTER — Other Ambulatory Visit: Payer: Self-pay | Admitting: Internal Medicine

## 2021-08-05 DIAGNOSIS — I4821 Permanent atrial fibrillation: Secondary | ICD-10-CM

## 2021-08-05 NOTE — Telephone Encounter (Signed)
Request for warfarin refill:  Last INR was 2.9 on 06/23/21 Next INR due 08/18/21 LOV 01/15/21 [name] PA-C  Refill approved

## 2021-08-05 NOTE — Telephone Encounter (Signed)
I think he can try diet instead. He can look up iron rich foods on [employer]

## 2021-08-06 ENCOUNTER — Encounter (HOSPITAL_BASED_OUTPATIENT_CLINIC_OR_DEPARTMENT_OTHER): Payer: PPO | Attending: Internal Medicine | Admitting: Internal Medicine

## 2021-08-06 DIAGNOSIS — I4821 Permanent atrial fibrillation: Secondary | ICD-10-CM

## 2021-08-06 DIAGNOSIS — L97812 Non-pressure chronic ulcer of other part of right lower leg with fat layer exposed: Secondary | ICD-10-CM

## 2021-08-06 DIAGNOSIS — G4733 Obstructive sleep apnea (adult) (pediatric): Secondary | ICD-10-CM | POA: Diagnosis not present

## 2021-08-06 DIAGNOSIS — I350 Nonrheumatic aortic (valve) stenosis: Secondary | ICD-10-CM | POA: Diagnosis not present

## 2021-08-06 DIAGNOSIS — I87311 Chronic venous hypertension (idiopathic) with ulcer of right lower extremity: Secondary | ICD-10-CM | POA: Diagnosis not present

## 2021-08-06 DIAGNOSIS — Z7901 Long term (current) use of anticoagulants: Secondary | ICD-10-CM | POA: Diagnosis not present

## 2021-08-06 NOTE — Progress Notes (Addendum)
[name], [name] ([id number]) Visit Report for 08/06/2021 Arrival Information Details Patient Name: Date of Service: [name] D. 08/06/2021 11:00 A M Medical Record Number: [id number] Patient Account Number: [id number] Date of Birth/Sex: Treating RN: Jul 23, 1950 (71 y.o. [name] Primary Care Len Azeez: [name] Other Clinician: Referring Juliana Boling: Treating Jeb Schloemer/Extender: Delford Field in Treatment: 2 Visit Information History Since Last Visit Added or deleted any medications: No Patient Arrived: Wheel Chair Any new allergies or adverse reactions: No Arrival Time: 11:16 Had a fall or experienced change in No Accompanied By: nephew activities of daily living that may affect Transfer Assistance: Manual risk of falls: Patient Identification Verified: Yes Signs or symptoms of abuse/neglect since last visito No Secondary Verification Process Completed: Yes Hospitalized since last visit: No Patient Requires Transmission-Based Precautions: No Implantable device outside of the clinic excluding No Patient Has Alerts: Yes cellular tissue based products placed in the center Patient Alerts: Patient on Blood Thinner since last visit: VVS ABI: R=.84 L=.81 Has Dressing in Place as Prescribed: Yes VVS TBI: R=.55 L=.67 Has Compression in Place as Prescribed: Yes Pain Present Now: No Electronic Signature(s) Signed: 08/06/2021 1:49:54 PM By: Lorrin Jackson Entered By: Lorrin Jackson on 08/06/2021 11:21:08 -------------------------------------------------------------------------------- Encounter Discharge Information Details Patient Name: Date of Service: [name], [name] D. 08/06/2021 11:00 A M Medical Record Number: [id number] Patient Account Number: [id number] Date of Birth/Sex: Treating RN: 07/24/1950 (71 y.o. [name] Primary Care Vessie Olmsted: [name] Other Clinician: Referring Hektor Huston: Treating Herschell Virani/Extender: Delford Field in Treatment: 2 Encounter Discharge Information Items Post Procedure Vitals Discharge Condition: Stable Temperature (F): 98 Ambulatory Status: Wheelchair Pulse (bpm): 58 Discharge Destination: Home Respiratory Rate (breaths/min): 20 Transportation: Private Auto Blood Pressure (mmHg): 131/77 Accompanied By: nephew Schedule Follow-up Appointment: Yes Clinical Summary of Care: Provided on 08/06/2021 Form Type Recipient Paper Patient Patient Electronic Signature(s) Signed: 08/06/2021 1:49:54 PM By: Lorrin Jackson Entered By: Lorrin Jackson on 08/06/2021 12:17:55 -------------------------------------------------------------------------------- Lower Extremity Assessment Details Patient Name: Date of Service: [name] D. 08/06/2021 11:00 A M Medical Record Number: [id number] Patient Account Number: [id number] Date of Birth/Sex: Treating RN: November 17, 1950 (71 y.o. [name] Primary Care Meeyah Ovitt: [name] Other Clinician: Referring Apollo Timothy: Treating Lorece Keach/Extender: Delford Field in Treatment: 2 Edema Assessment Assessed: [name]: Yes] Patrice Paradise: Yes] Edema: [Left: Yes] [Right: Yes] Calf Left: Right: Point of Measurement: 34 cm From Medial Instep 43 cm 43.7 cm Ankle Left: Right: Point of Measurement: 12 cm From Medial Instep 25 cm 25 cm Knee To Floor Left: Right: From Medial Instep 44 cm 44 cm Vascular Assessment Pulses: Dorsalis Pedis Palpable: [Right:Yes] Electronic Signature(s) Signed: 08/06/2021 1:49:54 PM By: Lorrin Jackson Entered By: Lorrin Jackson on 08/06/2021 12:18:25 -------------------------------------------------------------------------------- Multi Wound Chart Details Patient Name: Date of Service: [name] D. 08/06/2021 11:00 A M Medical Record Number: [id number] Patient Account Number: [id number] Date of Birth/Sex: Treating RN: 1950-09-26 (71 y.o. [name] Primary Care Elzia Hott: [name] Other Clinician: Referring Nyrie Sigal: Treating Sharonann Malbrough/Extender: Delford Field in Treatment: 2 Vital Signs Height(in): 69 Pulse(bpm): 35 Weight(lbs): 271 Blood Pressure(mmHg): 131/77 Body Mass Index(BMI): 40 Temperature(F): 98 Respiratory Rate(breaths/min): 20 Photos: [1:Right, Anterior Lower Leg] [2:Right, Proximal, Anterior Lower Leg N/A] [N/A:N/A] Wound Location: [1:Gradually Appeared] [2:Gradually Appeared] [N/A:N/A] Wounding Event: [1:Venous Leg Ulcer] [2:Venous Leg Ulcer] [N/A:N/A] Primary Etiology: [1:Sleep Apnea, Arrhythmia,] [2:Sleep Apnea, Arrhythmia,] [N/A:N/A] Comorbid History: [1:Hypertension, Peripheral  Venous Disease, Osteoarthritis 07/01/2021] [2:Hypertension, Peripheral Venous Disease, Osteoarthritis 08/06/2021] [N/A:N/A] Date Acquired: [1:2] [2:0] [N/A:N/A] Weeks of Treatment: [1:Open] [2:Open] [N/A:N/A] Wound Status: [1:No] [2:No] [N/A:N/A] Wound Recurrence: [1:7x4x0.1] [2:0.8x0.5x0.1] [N/A:N/A] Measurements L x W x D (cm) [1:21.991] [2:0.314] [N/A:N/A] A (cm) : rea [1:2.199] [2:0.031] [N/A:N/A] Volume (cm) : [1:78.10%] [2:0.00%] [N/A:N/A] % Reduction in A [1:rea: 78.10%] [2:0.00%] [N/A:N/A] % Reduction in Volume: [1:Full Thickness Without Exposed] [2:Full Thickness Without Exposed] [N/A:N/A] Classification: [1:Support Structures Medium] [2:Support Structures Medium] [N/A:N/A] Exudate A mount: [1:Serous] [2:Serosanguineous] [N/A:N/A] Exudate Type: [1:amber] [2:red, brown] [N/A:N/A] Exudate Color: [1:Distinct, outline attached] [2:Distinct, outline attached] [N/A:N/A] Wound Margin: [1:Large (67-100%)] [2:Medium (34-66%)] [N/A:N/A] Granulation A mount: [1:Red, Pink] [2:Red] [N/A:N/A] Granulation Quality: [1:Small (1-33%)] [2:Medium (34-66%)] [N/A:N/A] Necrotic A mount: [1:Adherent Slough] [2:Eschar] [N/A:N/A] Necrotic Tissue: [1:Fat Layer (Subcutaneous Tissue): Yes Fat Layer (Subcutaneous Tissue): Yes N/A] Exposed  Structures: [1:Fascia: No Tendon: No Muscle: No Joint: No Bone: No Medium (34-66%)] [2:Fascia: No Tendon: No Muscle: No Joint: No Bone: No None] [N/A:N/A] Epithelialization: [1:N/A] [2:Debridement - Excisional] [N/A:N/A] Debridement: Pre-procedure Verification/Time Out N/A [2:11:50] [N/A:N/A] Taken: [1:N/A] [2:Other] [N/A:N/A] Pain Control: [1:N/A] [2:Subcutaneous, Slough] [N/A:N/A] Tissue Debrided: [1:N/A] [2:Skin/Subcutaneous Tissue] [N/A:N/A] Level: [1:N/A] [2:0.4] [N/A:N/A] Debridement A (sq cm): [1:rea N/A] [2:Curette] [N/A:N/A] Instrument: [1:N/A] [2:Minimum] [N/A:N/A] Bleeding: [1:N/A] [2:Pressure] [N/A:N/A] Hemostasis A chieved: [1:N/A] [2:Procedure was tolerated well] [N/A:N/A] Debridement Treatment Response: [1:N/A] [2:0.8x0.5x0.1] [N/A:N/A] Post Debridement Measurements L x W x D (cm) [1:N/A] [2:0.031] [N/A:N/A] Post Debridement Volume: (cm) [1:N/A] [2:Debridement] [N/A:N/A] Treatment Notes Wound #1 (Lower Leg) Wound Laterality: Right, Anterior Cleanser Soap and Water Discharge Instruction: May shower and wash wound with dial antibacterial soap and water prior to dressing change. Wound Cleanser Discharge Instruction: Cleanse the wound with wound cleanser prior to applying a clean dressing using gauze sponges, not tissue or cotton balls. Peri-Wound Care Triamcinolone 15 (g) Discharge Instruction: Use triamcinolone 15 (g) as directed Sween Lotion (Moisturizing lotion) Discharge Instruction: Apply moisturizing lotion as directed Topical Gentamicin Discharge Instruction: As directed by physician Mupirocin Ointment Discharge Instruction: Apply Mupirocin (Bactroban) as instructed Primary Dressing Promogran Prisma Matrix, 4.34 (sq in) (silver collagen) Discharge Instruction: Moisten collagen with saline or hydrogel Secondary Dressing ABD Pad, 8x10 Discharge Instruction: Apply over primary dressing as directed. Zetuvit Plus 4x8 in Discharge Instruction: Apply over primary  dressing as directed. Secured With Compression Wrap Kerlix Roll 4.5x3.1 (in/yd) Discharge Instruction: Apply Kerlix and Coban compression as directed. Coban Self-Adherent Wrap 4x5 (in/yd) Discharge Instruction: Apply over Kerlix as directed. Compression Stockings Add-Ons Wound #2 (Lower Leg) Wound Laterality: Right, Anterior, Proximal Cleanser Soap and Water Discharge Instruction: May shower and wash wound with dial antibacterial soap and water prior to dressing change. Wound Cleanser Discharge Instruction: Cleanse the wound with wound cleanser prior to applying a clean dressing using gauze sponges, not tissue or cotton balls. Peri-Wound Care Triamcinolone 15 (g) Discharge Instruction: Use triamcinolone 15 (g) as directed Sween Lotion (Moisturizing lotion) Discharge Instruction: Apply moisturizing lotion as directed Topical Gentamicin Discharge Instruction: As directed by physician Mupirocin Ointment Discharge Instruction: Apply Mupirocin (Bactroban) as instructed Primary Dressing Promogran Prisma Matrix, 4.34 (sq in) (silver collagen) Discharge Instruction: Moisten collagen with saline or hydrogel Secondary Dressing ABD Pad, 8x10 Discharge Instruction: Apply over primary dressing as directed. Zetuvit Plus 4x8 in Discharge Instruction: Apply over primary dressing as directed. Secured With Compression Wrap Kerlix Roll 4.5x3.1 (in/yd) Discharge Instruction: Apply Kerlix and Coban compression as directed. Coban Self-Adherent Wrap 4x5 (in/yd) Discharge Instruction: Apply over Kerlix as directed. Compression Stockings Add-Ons Electronic Signature(s)  Signed: 08/07/2021 11:55:03 PM By: Kalman Shan DO Signed: 08/09/2021 3:49:35 PM By: Lorrin Jackson Entered By: Kalman Shan on 08/07/2021 23:55:03 -------------------------------------------------------------------------------- Multi-Disciplinary Care Plan Details Patient Name: Date of Service: [name], [name] D. 08/06/2021  11:00 A M Medical Record Number: [id number] Patient Account Number: [id number] Date of Birth/Sex: Treating RN: 1950-06-22 (71 y.o. [name] Primary Care Lacole Komorowski: [name] Other Clinician: Referring Avery Klingbeil: Treating Kahron Kauth/Extender: Delford Field in Treatment: 2 Active Inactive Abuse / Safety / Falls / Self Care Management Nursing Diagnoses: Impaired physical mobility Goals: Patient will remain injury free related to falls Date Initiated: 07/22/2021 Target Resolution Date: 08/19/2021 Goal Status: Active Interventions: Assess fall risk on admission and as needed Assess: immobility, friction, shearing, incontinence upon admission and as needed Assess impairment of mobility on admission and as needed per policy Notes: Wound/Skin Impairment Nursing Diagnoses: Impaired tissue integrity Goals: Patient/caregiver will verbalize understanding of skin care regimen Date Initiated: 07/22/2021 Target Resolution Date: 08/19/2021 Goal Status: Active Ulcer/skin breakdown will have a volume reduction of 30% by week 4 Date Initiated: 07/22/2021 Target Resolution Date: 08/19/2021 Goal Status: Active Interventions: Assess patient/caregiver ability to obtain necessary supplies Assess patient/caregiver ability to perform ulcer/skin care regimen upon admission and as needed Assess ulceration(s) every visit Provide education on ulcer and skin care Treatment Activities: Topical wound management initiated : 07/22/2021 Notes: Electronic Signature(s) Signed: 08/06/2021 1:49:54 PM By: Lorrin Jackson Entered By: Lorrin Jackson on 08/06/2021 11:16:07 -------------------------------------------------------------------------------- Pain Assessment Details Patient Name: Date of Service: [name] D. 08/06/2021 11:00 A M Medical Record Number: [id number] Patient Account Number: [id number] Date of Birth/Sex: Treating RN: [birth year] (71 y.o. [name] Primary Care Lesha Jager: Other Clinician: [name] Referring Beatryce Colombo: Treating Yuridia Couts/Extender: Delford Field in Treatment: 2 Active Problems Location of Pain Severity and Description of Pain Patient Has Paino No Site Locations Pain Management and Medication Current Pain Management: Electronic Signature(s) Signed: 08/06/2021 1:49:54 PM By: Lorrin Jackson Entered By: Lorrin Jackson on 08/06/2021 11:23:33 -------------------------------------------------------------------------------- Patient/Caregiver Education Details Patient Name: Date of Service: [name] D. 6/2/2023andnbsp11:00 [place] Record Number: [id number] Patient Account Number: [id number] Date of Birth/Gender: Treating RN: [birth year] (71 y.o. [name] Primary Care Physician: [name] Other Clinician: Referring Physician: Treating Physician/Extender: Delford Field in Treatment: 2 Education Assessment Education Provided To: Patient and Caregiver Education Topics Provided Venous: Methods: Explain/Verbal, Printed Responses: State content correctly Wound/Skin Impairment: Methods: Explain/Verbal, Printed Responses: State content correctly Electronic Signature(s) Signed: 08/06/2021 1:49:54 PM By: Lorrin Jackson Entered By: Lorrin Jackson on 08/06/2021 11:16:29 -------------------------------------------------------------------------------- Wound Assessment Details Patient Name: Date of Service: [name] D. 08/06/2021 11:00 A M Medical Record Number: [id number] Patient Account Number: [id number] Date of Birth/Sex: Treating RN: 11-19-1950 (71 y.o. [name] Primary Care Zhi Geier: [name] Other Clinician: Referring Ioannis Schuh: Treating Roland Lipke/Extender: Delford Field in Treatment: 2 Wound Status Wound Number: 1 Primary Venous Leg Ulcer Etiology: Wound Location: Right, Anterior Lower  Leg Wound Open Wounding Event: Gradually Appeared Status: Date Acquired: 07/01/2021 Comorbid Sleep Apnea, Arrhythmia, Hypertension, Peripheral Venous Weeks Of Treatment: 2 History: Disease, Osteoarthritis Clustered Wound: No Photos Wound Measurements Length: (cm) 7 Width: (cm) 4 Depth: (cm) 0.1 Area: (cm) 21.991 Volume: (cm) 2.199 % Reduction in Area: 78.1% % Reduction in Volume: 78.1% Epithelialization: Medium (34-66%) Tunneling: No Undermining: No Wound Description Classification: Full Thickness Without Exposed Support Structures Wound Margin:  Distinct, outline attached Exudate Amount: Medium Exudate Type: Serous Exudate Color: amber Foul Odor After Cleansing: No Slough/Fibrino Yes Wound Bed Granulation Amount: Large (67-100%) Exposed Structure Granulation Quality: Red, Pink Fascia Exposed: No Necrotic Amount: Small (1-33%) Fat Layer (Subcutaneous Tissue) Exposed: Yes Necrotic Quality: Adherent Slough Tendon Exposed: No Muscle Exposed: No Joint Exposed: No Bone Exposed: No Treatment Notes Wound #1 (Lower Leg) Wound Laterality: Right, Anterior Cleanser Soap and Water Discharge Instruction: May shower and wash wound with dial antibacterial soap and water prior to dressing change. Wound Cleanser Discharge Instruction: Cleanse the wound with wound cleanser prior to applying a clean dressing using gauze sponges, not tissue or cotton balls. Peri-Wound Care Triamcinolone 15 (g) Discharge Instruction: Use triamcinolone 15 (g) as directed Sween Lotion (Moisturizing lotion) Discharge Instruction: Apply moisturizing lotion as directed Topical Gentamicin Discharge Instruction: As directed by physician Mupirocin Ointment Discharge Instruction: Apply Mupirocin (Bactroban) as instructed Primary Dressing Promogran Prisma Matrix, 4.34 (sq in) (silver collagen) Discharge Instruction: Moisten collagen with saline or hydrogel Secondary Dressing ABD Pad, 8x10 Discharge  Instruction: Apply over primary dressing as directed. Zetuvit Plus 4x8 in Discharge Instruction: Apply over primary dressing as directed. Secured With Compression Wrap Kerlix Roll 4.5x3.1 (in/yd) Discharge Instruction: Apply Kerlix and Coban compression as directed. Coban Self-Adherent Wrap 4x5 (in/yd) Discharge Instruction: Apply over Kerlix as directed. Compression Stockings Add-Ons Electronic Signature(s) Signed: 08/06/2021 1:49:54 PM By: Lorrin Jackson Entered By: Lorrin Jackson on 08/06/2021 11:32:16 -------------------------------------------------------------------------------- Wound Assessment Details Patient Name: Date of Service: [name] D. 08/06/2021 11:00 A M Medical Record Number: [id number] Patient Account Number: [id number] Date of Birth/Sex: Treating RN: 01-Dec-1950 (71 y.o. [name] Primary Care Kelani Robart: [name] Other Clinician: Referring Dewight Catino: Treating Delisia Mcquiston/Extender: Delford Field in Treatment: 2 Wound Status Wound Number: 2 Primary Venous Leg Ulcer Etiology: Wound Location: Right, Proximal, Anterior Lower Leg Wound Open Wounding Event: Gradually Appeared Status: Date Acquired: 08/06/2021 Comorbid Sleep Apnea, Arrhythmia, Hypertension, Peripheral Venous Weeks Of Treatment: 0 History: Disease, Osteoarthritis Clustered Wound: No Photos Wound Measurements Length: (cm) 0.8 Width: (cm) 0.5 Depth: (cm) 0.1 Area: (cm) 0.314 Volume: (cm) 0.031 % Reduction in Area: 0% % Reduction in Volume: 0% Epithelialization: None Tunneling: No Undermining: No Wound Description Classification: Full Thickness Without Exposed Support Structures Wound Margin: Distinct, outline attached Exudate Amount: Medium Exudate Type: Serosanguineous Exudate Color: red, brown Foul Odor After Cleansing: No Slough/Fibrino No Wound Bed Granulation Amount: Medium (34-66%) Exposed Structure Granulation Quality: Red Fascia Exposed:  No Necrotic Amount: Medium (34-66%) Fat Layer (Subcutaneous Tissue) Exposed: Yes Necrotic Quality: Eschar Tendon Exposed: No Muscle Exposed: No Joint Exposed: No Bone Exposed: No Treatment Notes Wound #2 (Lower Leg) Wound Laterality: Right, Anterior, Proximal Cleanser Soap and Water Discharge Instruction: May shower and wash wound with dial antibacterial soap and water prior to dressing change. Wound Cleanser Discharge Instruction: Cleanse the wound with wound cleanser prior to applying a clean dressing using gauze sponges, not tissue or cotton balls. Peri-Wound Care Triamcinolone 15 (g) Discharge Instruction: Use triamcinolone 15 (g) as directed Sween Lotion (Moisturizing lotion) Discharge Instruction: Apply moisturizing lotion as directed Topical Gentamicin Discharge Instruction: As directed by physician Mupirocin Ointment Discharge Instruction: Apply Mupirocin (Bactroban) as instructed Primary Dressing Promogran Prisma Matrix, 4.34 (sq in) (silver collagen) Discharge Instruction: Moisten collagen with saline or hydrogel Secondary Dressing ABD Pad, 8x10 Discharge Instruction: Apply over primary dressing as directed. Zetuvit Plus 4x8 in Discharge Instruction: Apply over primary dressing as directed. Secured With Compression Wrap Kerlix  Roll 4.5x3.1 (in/yd) Discharge Instruction: Apply Kerlix and Coban compression as directed. Coban Self-Adherent Wrap 4x5 (in/yd) Discharge Instruction: Apply over Kerlix as directed. Compression Stockings Add-Ons Electronic Signature(s) Signed: 08/06/2021 1:49:54 PM By: Lorrin Jackson Entered By: Lorrin Jackson on 08/06/2021 12:01:53 -------------------------------------------------------------------------------- Vitals Details Patient Name: Date of Service: [name], [name] D. 08/06/2021 11:00 A M Medical Record Number: [id number] Patient Account Number: [id number] Date of Birth/Sex: Treating RN: December 13, 1950 (71 y.o. [name] Primary Care Asha Grumbine: [name] Other Clinician: Referring Avonna Iribe: Treating Jaegar Croft/Extender: Delford Field in Treatment: 2 Vital Signs Time Taken: 11:22 Temperature (F): 98 Height (in): 69 Pulse (bpm): 58 Weight (lbs): 271 Respiratory Rate (breaths/min): 20 Body Mass Index (BMI): 40 Blood Pressure (mmHg): 131/77 Reference Range: 80 - 120 mg / dl Electronic Signature(s) Signed: 08/06/2021 1:49:54 PM By: Lorrin Jackson Entered By: Lorrin Jackson on 08/06/2021 11:23:29

## 2021-08-09 NOTE — Progress Notes (Signed)
ADAR, RASE ([id number]) Visit Report for 08/06/2021 Chief Complaint Document Details Patient Name: Date of Service: Blake Lull D. 08/06/2021 11:00 A M Medical Record Number: [id number] Patient Account Number: [id number] Date of Birth/Sex: Treating RN: September 24, 1950 (71 y.o. [name] Primary Care Provider: [name] Other Clinician: Referring Provider: Treating Provider/Extender: Delford Field in Treatment: 2 Information Obtained from: Patient Chief Complaint 07/22/2021; right lower extremity wound Electronic Signature(s) Signed: 08/07/2021 11:55:11 PM By: Kalman Shan DO Entered By: Kalman Shan on 08/07/2021 23:55:11 -------------------------------------------------------------------------------- Debridement Details Patient Name: Date of Service: Blake Lull D. 08/06/2021 11:00 A M Medical Record Number: [id number] Patient Account Number: [id number] Date of Birth/Sex: Treating RN: 1950-10-14 (71 y.o. [name] Primary Care Provider: [name] Other Clinician: Referring Provider: Treating Provider/Extender: Delford Field in Treatment: 2 Debridement Performed for Assessment: Wound #2 Right,Proximal,Anterior Lower Leg Performed By: Physician Kalman Shan, DO Debridement Type: Debridement Severity of Tissue Pre Debridement: Fat layer exposed Level of Consciousness (Pre-procedure): Awake and Alert Pre-procedure Verification/Time Out Yes - 11:50 Taken: Start Time: 11:51 Pain Control: Other : Benzocaine T Area Debrided (L x W): otal 0.8 (cm) x 0.5 (cm) = 0.4 (cm) Tissue and other material debrided: Non-Viable, Slough, Subcutaneous, Slough Level: Skin/Subcutaneous Tissue Debridement Description: Excisional Instrument: Curette Bleeding: Minimum Hemostasis Achieved: Pressure End Time: 11:55 Response to Treatment: Procedure was tolerated well Level of Consciousness (Post- Awake and  Alert procedure): Post Debridement Measurements of Total Wound Length: (cm) 0.8 Width: (cm) 0.5 Depth: (cm) 0.1 Volume: (cm) 0.031 Character of Wound/Ulcer Post Debridement: Stable Severity of Tissue Post Debridement: Fat layer exposed Post Procedure Diagnosis Same as Pre-procedure Electronic Signature(s) Signed: 08/06/2021 1:49:54 PM By: Lorrin Jackson Signed: 08/08/2021 12:16:43 AM By: Kalman Shan DO Entered By: Lorrin Jackson on 08/06/2021 11:57:49 -------------------------------------------------------------------------------- HPI Details Patient Name: Date of Service: [name], Blake Husk D. 08/06/2021 11:00 A M Medical Record Number: [id number] Patient Account Number: [id number] Date of Birth/Sex: Treating RN: 08/25/1950 (71 y.o. [name] Primary Care Provider: [name] Other Clinician: Referring Provider: Treating Provider/Extender: Delford Field in Treatment: 2 History of Present Illness HPI Description: Admission 07/22/2021 Mr. Jakevion Arney is a 71 year old male with a past medical history of OSA, aortic stenosis, permanent A-fib on Coumadin that presents to the clinic for a 7-week history of nonhealing ulcer to the right lower extremity. He states this wound developed spontaneously. He has been using Neosporin to the wound bed and keeping the area covered with absorbent pads and Kerlix. He denies signs of infection. He reports serous drainage. The only diurectic he takes is 12.5 mg of hydrochlorothiazide for his blood pressure. He does not wear daily compression stockings. 5/25; patient presents for follow-up. He tolerated the compression wrap well. We have been using antibiotic ointment with calcium alginate under Kerlix/Coban. He is obtaining his ABIs next week. He has no issues or complaints today. 6/2; Patient presents for follow up. He reports tolerating the compression wrap well. We have been using antibiotic ointment with calcium alginate  under kerlix/coban. He has a new wound just proximal to the original wounds. Its unclear how this developed. He had ABIs with TBIs last week that showed a right ABI of 0.84 and TBI of 0.55 with triphasic wave forms at the [facility] and biphasic wave forms at the [facility]. Electronic Signature(s) Signed: 08/08/2021 12:00:35 AM By: Kalman Shan DO Entered By: Kalman Shan on 08/08/2021 00:00:34 --------------------------------------------------------------------------------  Physical Exam Details Patient Name: Date of Service: Blake Lull D. 08/06/2021 11:00 A M Medical Record Number: [id number] Patient Account Number: [id number] Date of Birth/Sex: Treating RN: [birth year] (71 y.o. [name] Primary Care Provider: [name] Other Clinician: Referring Provider: Treating Provider/Extender: Delford Field in Treatment: 2 Constitutional respirations regular, non-labored and within target range for patient.. Cardiovascular 2+ dorsalis pedis/posterior tibialis pulses. Psychiatric pleasant and cooperative. Notes Right lower extremity: 2+ pitting edema to the knee. Two open wounds with nonviable tissue and granulation tissue. No surrounding signs of soft tissue infection. Electronic Signature(s) Signed: 08/08/2021 12:02:09 AM By: Kalman Shan DO Entered By: Kalman Shan on 08/08/2021 00:02:09 -------------------------------------------------------------------------------- Physician Orders Details Patient Name: Date of Service: [name], Blake Husk D. 08/06/2021 11:00 A M Medical Record Number: [id number] Patient Account Number: [id number] Date of Birth/Sex: Treating RN: Nov 13, 1950 (71 y.o. [name] Primary Care Provider: [name] Other Clinician: Referring Provider: Treating Provider/Extender: Delford Field in Treatment: 2 Verbal / Phone Orders: No Diagnosis Coding ICD-10 Coding Code Description [phone] Non-pressure  chronic ulcer of other part of right lower leg with fat layer exposed I87.311 Chronic venous hypertension (idiopathic) with ulcer of right lower extremity G47.33 Obstructive sleep apnea (adult) (pediatric) I48.21 Permanent atrial fibrillation Follow-up Appointments ppointment in 1 week. - 08/16/21 @ 2:45pm with Dr. Heber [place] Leveda Anna, Room 7) Return A Bathing/ Shower/ Hygiene May shower with protection but do not get wound dressing(s) wet. - Can use cast protector bag from [facility], [facility] or [employer] Edema Control - Lymphedema / SCD / Other Elevate legs to the level of the heart or above for 30 minutes daily and/or when sitting, a frequency of: - throughout the day Avoid standing for long periods of time. Additional Orders / Instructions Follow Nutritious Diet Wound Treatment Wound #1 - Lower Leg Wound Laterality: Right, Anterior Cleanser: Soap and Water 1 x Per Week/30 Days Discharge Instructions: May shower and wash wound with dial antibacterial soap and water prior to dressing change. Cleanser: Wound Cleanser 1 x Per Week/30 Days Discharge Instructions: Cleanse the wound with wound cleanser prior to applying a clean dressing using gauze sponges, not tissue or cotton balls. Peri-Wound Care: Triamcinolone 15 (g) 1 x Per Week/30 Days Discharge Instructions: Use triamcinolone 15 (g) as directed Peri-Wound Care: Sween Lotion (Moisturizing lotion) 1 x Per Week/30 Days Discharge Instructions: Apply moisturizing lotion as directed Topical: Gentamicin 1 x Per Week/30 Days Discharge Instructions: As directed by physician Topical: Mupirocin Ointment 1 x Per Week/30 Days Discharge Instructions: Apply Mupirocin (Bactroban) as instructed Prim Dressing: Promogran Prisma Matrix, 4.34 (sq in) (silver collagen) 1 x Per Week/30 Days ary Discharge Instructions: Moisten collagen with saline or hydrogel Secondary Dressing: ABD Pad, 8x10 1 x Per Week/30 Days Discharge Instructions: Apply over primary dressing as  directed. Secondary Dressing: Zetuvit Plus 4x8 in 1 x Per Week/30 Days Discharge Instructions: Apply over primary dressing as directed. Compression Wrap: Kerlix Roll 4.5x3.1 (in/yd) 1 x Per Week/30 Days Discharge Instructions: Apply Kerlix and Coban compression as directed. Compression Wrap: Coban Self-Adherent Wrap 4x5 (in/yd) 1 x Per Week/30 Days Discharge Instructions: Apply over Kerlix as directed. Wound #2 - Lower Leg Wound Laterality: Right, Anterior, Proximal Cleanser: Soap and Water 1 x Per Week/30 Days Discharge Instructions: May shower and wash wound with dial antibacterial soap and water prior to dressing change. Cleanser: Wound Cleanser 1 x Per Week/30 Days Discharge Instructions: Cleanse the wound with wound cleanser prior  to applying a clean dressing using gauze sponges, not tissue or cotton balls. Peri-Wound Care: Triamcinolone 15 (g) 1 x Per Week/30 Days Discharge Instructions: Use triamcinolone 15 (g) as directed Peri-Wound Care: Sween Lotion (Moisturizing lotion) 1 x Per Week/30 Days Discharge Instructions: Apply moisturizing lotion as directed Topical: Gentamicin 1 x Per Week/30 Days Discharge Instructions: As directed by physician Topical: Mupirocin Ointment 1 x Per Week/30 Days Discharge Instructions: Apply Mupirocin (Bactroban) as instructed Prim Dressing: Promogran Prisma Matrix, 4.34 (sq in) (silver collagen) 1 x Per Week/30 Days ary Discharge Instructions: Moisten collagen with saline or hydrogel Secondary Dressing: ABD Pad, 8x10 1 x Per Week/30 Days Discharge Instructions: Apply over primary dressing as directed. Secondary Dressing: Zetuvit Plus 4x8 in 1 x Per Week/30 Days Discharge Instructions: Apply over primary dressing as directed. Compression Wrap: Kerlix Roll 4.5x3.1 (in/yd) 1 x Per Week/30 Days Discharge Instructions: Apply Kerlix and Coban compression as directed. Compression Wrap: Coban Self-Adherent Wrap 4x5 (in/yd) 1 x Per Week/30 Days Discharge  Instructions: Apply over Kerlix as directed. Electronic Signature(s) Signed: 08/08/2021 12:16:43 AM By: Kalman Shan DO Previous Signature: 08/06/2021 1:49:54 PM Version By: Lorrin Jackson Entered By: Kalman Shan on 08/08/2021 00:02:22 -------------------------------------------------------------------------------- Problem List Details Patient Name: Date of Service: [name], Blake Husk D. 08/06/2021 11:00 A M Medical Record Number: [id number] Patient Account Number: [id number] Date of Birth/Sex: Treating RN: [birth year] (71 y.o. [name] Primary Care Provider: [name] Other Clinician: Referring Provider: Treating Provider/Extender: Delford Field in Treatment: 2 Active Problems ICD-10 Encounter Code Description Active Date MDM Diagnosis [phone] Non-pressure chronic ulcer of other part of right lower leg with fat layer 07/22/2021 No Yes exposed I87.311 Chronic venous hypertension (idiopathic) with ulcer of right lower extremity 07/22/2021 No Yes G47.33 Obstructive sleep apnea (adult) (pediatric) 07/22/2021 No Yes I48.21 Permanent atrial fibrillation 07/22/2021 No Yes Inactive Problems Resolved Problems Electronic Signature(s) Signed: 08/07/2021 11:54:55 PM By: Kalman Shan DO Previous Signature: 08/06/2021 1:49:54 PM Version By: Lorrin Jackson Entered By: Kalman Shan on 08/07/2021 23:54:55 -------------------------------------------------------------------------------- Progress Note Details Patient Name: Date of Service: [name], Blake Husk D. 08/06/2021 11:00 A M Medical Record Number: [id number] Patient Account Number: [id number] Date of Birth/Sex: Treating RN: 1950-06-21 (71 y.o. [name] Primary Care Provider: [name] Other Clinician: Referring Provider: Treating Provider/Extender: Delford Field in Treatment: 2 Subjective Chief Complaint Information obtained from Patient 07/22/2021; right lower  extremity wound History of Present Illness (HPI) Admission 07/22/2021 [name] is a 71 year old male with a past medical history of OSA, aortic stenosis, permanent A-fib on Coumadin that presents to the clinic for a 7-week history of nonhealing ulcer to the right lower extremity. He states this wound developed spontaneously. He has been using Neosporin to the wound bed and keeping the area covered with absorbent pads and Kerlix. He denies signs of infection. He reports serous drainage. The only diurectic he takes is 12.5 mg of hydrochlorothiazide for his blood pressure. He does not wear daily compression stockings. 5/25; patient presents for follow-up. He tolerated the compression wrap well. We have been using antibiotic ointment with calcium alginate under Kerlix/Coban. He is obtaining his ABIs next week. He has no issues or complaints today. 6/2; Patient presents for follow up. He reports tolerating the compression wrap well. We have been using antibiotic ointment with calcium alginate under kerlix/coban. He has a new wound just proximal to the original wounds. Its unclear how this developed. He had ABIs with  TBIs last week that showed a right ABI of 0.84 and TBI of 0.55 with triphasic wave forms at the [facility] and biphasic wave forms at the [facility]. Patient History Information obtained from Patient, Chart. Family History Heart Disease - Father, Hypertension - Father, Stroke - Father, No family history of Cancer, Diabetes, Hereditary Spherocytosis, Kidney Disease, Lung Disease, Seizures, Thyroid Problems, Tuberculosis. Social History Former smoker, Marital Status - Single, Alcohol Use - Never, Drug Use - No History, Caffeine Use - Never. Medical History Respiratory Patient has history of Sleep Apnea Cardiovascular Patient has history of Arrhythmia, Hypertension, Peripheral Venous Disease Musculoskeletal Patient has history of Osteoarthritis Medical A Surgical History  Notes nd Cardiovascular Aortic Stenosis-Mitral Valve Regurg. Objective Constitutional respirations regular, non-labored and within target range for patient.. Vitals Time Taken: 11:22 AM, Height: 69 in, Weight: 271 lbs, BMI: 40, Temperature: 98 F, Pulse: 58 bpm, Respiratory Rate: 20 breaths/min, Blood Pressure: 131/77 mmHg. Cardiovascular 2+ dorsalis pedis/posterior tibialis pulses. Psychiatric pleasant and cooperative. General Notes: Right lower extremity: 2+ pitting edema to the knee. Two open wounds with nonviable tissue and granulation tissue. No surrounding signs of soft tissue infection. Integumentary (Hair, Skin) Wound #1 status is Open. Original cause of wound was Gradually Appeared. The date acquired was: 07/01/2021. The wound has been in treatment 2 weeks. The wound is located on the Right,Anterior Lower Leg. The wound measures 7cm length x 4cm width x 0.1cm depth; 21.991cm^2 area and 2.199cm^3 volume. There is Fat Layer (Subcutaneous Tissue) exposed. There is no tunneling or undermining noted. There is a medium amount of serous drainage noted. The wound margin is distinct with the outline attached to the wound base. There is large (67-100%) red, pink granulation within the wound bed. There is a small (1-33%) amount of necrotic tissue within the wound bed including Adherent Slough. Wound #2 status is Open. Original cause of wound was Gradually Appeared. The date acquired was: 08/06/2021. The wound is located on the Right,Proximal,Anterior Lower Leg. The wound measures 0.8cm length x 0.5cm width x 0.1cm depth; 0.314cm^2 area and 0.031cm^3 volume. There is Fat Layer (Subcutaneous Tissue) exposed. There is no tunneling or undermining noted. There is a medium amount of serosanguineous drainage noted. The wound margin is distinct with the outline attached to the wound base. There is medium (34-66%) red granulation within the wound bed. There is a medium (34-66%) amount of necrotic tissue  within the wound bed including Eschar. Assessment Active Problems ICD-10 Non-pressure chronic ulcer of other part of right lower leg with fat layer exposed Chronic venous hypertension (idiopathic) with ulcer of right lower extremity Obstructive sleep apnea (adult) (pediatric) Permanent atrial fibrillation Patients wound has shown improvement in size and appearance since last clinic visit. He has developed a new wound of unclear etiology. Likely trauma. I debrided non-viable tissue. He has a health granulation bed to both wounds and recommended switching the dressing to collagen. His ABIs suggest he has adequate blood flow for healing and we will continue compression therapy. Procedures Wound #2 Pre-procedure diagnosis of Wound #2 is a Venous Leg Ulcer located on the Right,Proximal,Anterior Lower Leg .Severity of Tissue Pre Debridement is: Fat layer exposed. There was a Excisional Skin/Subcutaneous Tissue Debridement with a total area of 0.4 sq cm performed by Kalman Shan, DO. With the following instrument(s): Curette to remove Non-Viable tissue/material. Material removed includes Subcutaneous Tissue and Slough and after achieving pain control using Other (Benzocaine). No specimens were taken. A time out was conducted at 11:50, prior to the start of  the procedure. A Minimum amount of bleeding was controlled with Pressure. The procedure was tolerated well. Post Debridement Measurements: 0.8cm length x 0.5cm width x 0.1cm depth; 0.031cm^3 volume. Character of Wound/Ulcer Post Debridement is stable. Severity of Tissue Post Debridement is: Fat layer exposed. Post procedure Diagnosis Wound #2: Same as Pre-Procedure Plan Follow-up Appointments: Return Appointment in 1 week. - 08/16/21 @ 2:45pm with Dr. Heber [place] Leveda Anna, Room 7) Bathing/ Shower/ Hygiene: May shower with protection but do not get wound dressing(s) wet. - Can use cast protector bag from [facility], [facility] or [employer] Edema Control -  Lymphedema / SCD / Other: Elevate legs to the level of the heart or above for 30 minutes daily and/or when sitting, a frequency of: - throughout the day Avoid standing for long periods of time. Additional Orders / Instructions: Follow Nutritious Diet WOUND #1: - Lower Leg Wound Laterality: Right, Anterior Cleanser: Soap and Water 1 x Per Week/30 Days Discharge Instructions: May shower and wash wound with dial antibacterial soap and water prior to dressing change. Cleanser: Wound Cleanser 1 x Per Week/30 Days Discharge Instructions: Cleanse the wound with wound cleanser prior to applying a clean dressing using gauze sponges, not tissue or cotton balls. Peri-Wound Care: Triamcinolone 15 (g) 1 x Per Week/30 Days Discharge Instructions: Use triamcinolone 15 (g) as directed Peri-Wound Care: Sween Lotion (Moisturizing lotion) 1 x Per Week/30 Days Discharge Instructions: Apply moisturizing lotion as directed Topical: Gentamicin 1 x Per Week/30 Days Discharge Instructions: As directed by physician Topical: Mupirocin Ointment 1 x Per Week/30 Days Discharge Instructions: Apply Mupirocin (Bactroban) as instructed Prim Dressing: Promogran Prisma Matrix, 4.34 (sq in) (silver collagen) 1 x Per Week/30 Days ary Discharge Instructions: Moisten collagen with saline or hydrogel Secondary Dressing: ABD Pad, 8x10 1 x Per Week/30 Days Discharge Instructions: Apply over primary dressing as directed. Secondary Dressing: Zetuvit Plus 4x8 in 1 x Per Week/30 Days Discharge Instructions: Apply over primary dressing as directed. Com pression Wrap: Kerlix Roll 4.5x3.1 (in/yd) 1 x Per Week/30 Days Discharge Instructions: Apply Kerlix and Coban compression as directed. Com pression Wrap: Coban Self-Adherent Wrap 4x5 (in/yd) 1 x Per Week/30 Days Discharge Instructions: Apply over Kerlix as directed. WOUND #2: - Lower Leg Wound Laterality: Right, Anterior, Proximal Cleanser: Soap and Water 1 x Per Week/30  Days Discharge Instructions: May shower and wash wound with dial antibacterial soap and water prior to dressing change. Cleanser: Wound Cleanser 1 x Per Week/30 Days Discharge Instructions: Cleanse the wound with wound cleanser prior to applying a clean dressing using gauze sponges, not tissue or cotton balls. Peri-Wound Care: Triamcinolone 15 (g) 1 x Per Week/30 Days Discharge Instructions: Use triamcinolone 15 (g) as directed Peri-Wound Care: Sween Lotion (Moisturizing lotion) 1 x Per Week/30 Days Discharge Instructions: Apply moisturizing lotion as directed Topical: Gentamicin 1 x Per Week/30 Days Discharge Instructions: As directed by physician Topical: Mupirocin Ointment 1 x Per Week/30 Days Discharge Instructions: Apply Mupirocin (Bactroban) as instructed Prim Dressing: Promogran Prisma Matrix, 4.34 (sq in) (silver collagen) 1 x Per Week/30 Days ary Discharge Instructions: Moisten collagen with saline or hydrogel Secondary Dressing: ABD Pad, 8x10 1 x Per Week/30 Days Discharge Instructions: Apply over primary dressing as directed. Secondary Dressing: Zetuvit Plus 4x8 in 1 x Per Week/30 Days Discharge Instructions: Apply over primary dressing as directed. Com pression Wrap: Kerlix Roll 4.5x3.1 (in/yd) 1 x Per Week/30 Days Discharge Instructions: Apply Kerlix and Coban compression as directed. Com pression Wrap: Coban Self-Adherent Wrap 4x5 (in/yd) 1 x Per Week/30 Days  Discharge Instructions: Apply over Kerlix as directed. 1. In office sharp debridement 2. collagen under kerlix/coban 3. Follow up in one week Electronic Signature(s) Signed: 08/08/2021 12:06:36 AM By: Kalman Shan DO Entered By: Kalman Shan on 08/08/2021 00:06:36 -------------------------------------------------------------------------------- HxROS Details Patient Name: Date of Service: [name], Blake Husk D. 08/06/2021 11:00 A M Medical Record Number: [id number] Patient Account Number: [id number] Date of  Birth/Sex: Treating RN: Dec 07, 1950 (71 y.o. [name] Primary Care Provider: [name] Other Clinician: Referring Provider: Treating Provider/Extender: Delford Field in Treatment: 2 Information Obtained From Patient Chart Respiratory Medical History: Positive for: Sleep Apnea Cardiovascular Medical History: Positive for: Arrhythmia; Hypertension; Peripheral Venous Disease Past Medical History Notes: Aortic Stenosis-Mitral Valve Regurg. Musculoskeletal Medical History: Positive for: Osteoarthritis Immunizations Pneumococcal Vaccine: Received Pneumococcal Vaccination: No Implantable Devices None Family and Social History Cancer: No; Diabetes: No; Heart Disease: Yes - Father; Hereditary Spherocytosis: No; Hypertension: Yes - Father; Kidney Disease: No; Lung Disease: No; Seizures: No; Stroke: Yes - Father; Thyroid Problems: No; Tuberculosis: No; Former smoker; Marital Status - Single; Alcohol Use: Never; Drug Use: No History; Caffeine Use: Never; Financial Concerns: No; Food, Clothing or Shelter Needs: No; Support System Lacking: No; Transportation Concerns: No Electronic Signature(s) Signed: 08/08/2021 12:16:43 AM By: Kalman Shan DO Signed: 08/09/2021 3:49:35 PM By: Lorrin Jackson Entered By: Kalman Shan on 08/08/2021 00:00:41 -------------------------------------------------------------------------------- SuperBill Details Patient Name: Date of Service: [name], Blake Husk D. 08/06/2021 Medical Record Number: [id number] Patient Account Number: [id number] Date of Birth/Sex: Treating RN: 1951-02-20 (71 y.o. [name] Primary Care Provider: [name] Other Clinician: Referring Provider: Treating Provider/Extender: Delford Field in Treatment: 2 Diagnosis Coding ICD-10 Codes Code Description [phone] Non-pressure chronic ulcer of other part of right lower leg with fat layer exposed I87.311 Chronic  venous hypertension (idiopathic) with ulcer of right lower extremity G47.33 Obstructive sleep apnea (adult) (pediatric) I48.21 Permanent atrial fibrillation Facility Procedures CPT4 Code: [id number] Description: 09326 - DEB SUBQ TISSUE 20 SQ CM/< ICD-10 Diagnosis Description L97.812 Non-pressure chronic ulcer of other part of right lower leg with fat layer exp Modifier: osed Quantity: 1 Physician Procedures : CPT4 Code Description Modifier [id number] - WC PHYS LEVEL 3 - EST PT ICD-10 Diagnosis Description L97.812 Non-pressure chronic ulcer of other part of right lower leg with fat layer exposed I87.311 Chronic venous hypertension (idiopathic) with ulcer  of right lower extremity G47.33 Obstructive sleep apnea (adult) (pediatric) I48.21 Permanent atrial fibrillation Quantity: 1 : [id number] [id number] - WC PHYS SUBQ TISS 20 SQ CM 1 ICD-10 Diagnosis Description L93.790 Non-pressure chronic ulcer of other part of right lower leg with fat layer exposed Quantity: Electronic Signature(s) Signed: 08/08/2021 12:07:02 AM By: Kalman Shan DO Previous Signature: 08/06/2021 1:49:54 PM Version By: Lorrin Jackson Entered By: Kalman Shan on 08/08/2021 00:07:01

## 2021-08-16 ENCOUNTER — Ambulatory Visit (HOSPITAL_BASED_OUTPATIENT_CLINIC_OR_DEPARTMENT_OTHER): Payer: PPO | Admitting: Internal Medicine

## 2021-08-17 ENCOUNTER — Encounter (HOSPITAL_BASED_OUTPATIENT_CLINIC_OR_DEPARTMENT_OTHER): Payer: PPO | Admitting: Internal Medicine

## 2021-08-17 DIAGNOSIS — L97812 Non-pressure chronic ulcer of other part of right lower leg with fat layer exposed: Secondary | ICD-10-CM

## 2021-08-17 DIAGNOSIS — I87311 Chronic venous hypertension (idiopathic) with ulcer of right lower extremity: Secondary | ICD-10-CM

## 2021-08-17 DIAGNOSIS — G4733 Obstructive sleep apnea (adult) (pediatric): Secondary | ICD-10-CM

## 2021-08-17 DIAGNOSIS — I4821 Permanent atrial fibrillation: Secondary | ICD-10-CM

## 2021-08-17 NOTE — Progress Notes (Signed)
ALPHUS, ZECK ([id number]) Visit Report for 07/29/2021 Arrival Information Details Patient Name: Date of Service: [name] 07/29/2021 11:00 A M Medical Record Number: [id number] Patient Account Number: [id number] Date of Birth/Sex: Treating RN: 04/11/1950 (71 y.o. [name] Primary Care Azion Centrella: [name] Other Clinician: Referring Raeden Schippers: Treating Meril Dray/Extender: Delford Field in Treatment: 1 Visit Information History Since Last Visit All ordered tests and consults were completed: Yes Patient Arrived: Wheel Chair Added or deleted any medications: No Arrival Time: 11:21 Any new allergies or adverse reactions: No Accompanied By: nephew Had a fall or experienced change in No Transfer Assistance: None activities of daily living that may affect Patient Identification Verified: Yes risk of falls: Secondary Verification Process Completed: Yes Signs or symptoms of abuse/neglect since last visito No Patient Requires Transmission-Based Precautions: No Hospitalized since last visit: No Patient Has Alerts: Yes Has Dressing in Place as Prescribed: Yes Patient Alerts: Patient on Blood Thinner Pain Present Now: No VVS ABI: R=.84 L=.81 VVS TBI: R=.55 L=.67 Electronic Signature(s) Signed: 07/29/2021 12:35:47 PM By: Lorrin Jackson Entered By: Lorrin Jackson on 07/29/2021 12:35:47 -------------------------------------------------------------------------------- Encounter Discharge Information Details Patient Name: Date of Service: [name], [name] 07/29/2021 11:00 A M Medical Record Number: [id number] Patient Account Number: [id number] Date of Birth/Sex: Treating RN: [birth year] (71 y.o. [name] Primary Care Lorenso Quirino: [name] Other Clinician: Referring Brasen Bundren: Treating Bluma Buresh/Extender: Delford Field in Treatment: 1 Encounter Discharge Information Items Post Procedure Vitals Discharge Condition:  Stable Temperature (F): 97.9 Ambulatory Status: Wheelchair Pulse (bpm): 51 Discharge Destination: Home Respiratory Rate (breaths/min): 18 Transportation: Private Auto Blood Pressure (mmHg): 144/81 Accompanied By: Alanson Puls Schedule Follow-up Appointment: Yes Clinical Summary of Care: Provided on 07/29/2021 Form Type Recipient Paper Patient Patient Electronic Signature(s) Signed: 07/29/2021 4:54:47 PM By: Lorrin Jackson Entered By: Lorrin Jackson on 07/29/2021 12:25:41 -------------------------------------------------------------------------------- Lower Extremity Assessment Details Patient Name: Date of Service: [name] 07/29/2021 11:00 A M Medical Record Number: [id number] Patient Account Number: [id number] Date of Birth/Sex: Treating RN: 02-17-1951 (71 y.o. [name] Primary Care Anasophia Pecor: [name] Other Clinician: Referring Jonanthan Bolender: Treating Denesia Donelan/Extender: Delford Field in Treatment: 1 Edema Assessment Assessed: [name]: No] Patrice Paradise: Yes] Edema: [Left: Ye] [Right: s] Calf Left: Right: Point of Measurement: 34 cm From Medial Instep 44 cm Ankle Left: Right: Point of Measurement: 12 cm From Medial Instep 25.1 cm Vascular Assessment Pulses: Dorsalis Pedis Palpable: [Right:Yes] Electronic Signature(s) Signed: 07/29/2021 4:54:47 PM By: Lorrin Jackson Signed: 08/17/2021 8:46:54 AM By: Erenest Blank Entered By: Erenest Blank on 07/29/2021 11:44:50 -------------------------------------------------------------------------------- Multi Wound Chart Details Patient Name: Date of Service: [name] 07/29/2021 11:00 A M Medical Record Number: [id number] Patient Account Number: [id number] Date of Birth/Sex: Treating RN: 22-Feb-1951 (71 y.o. [name] Primary Care Janaia Kozel: [name] Other Clinician: Referring Amine Adelson: Treating Rebeckah Masih/Extender: Delford Field in Treatment: 1 Vital  Signs Height(in): 69 Pulse(bpm): 28 Weight(lbs): 271 Blood Pressure(mmHg): 144/81 Body Mass Index(BMI): 40 Temperature(F): 97.9 Respiratory Rate(breaths/min): 18 Photos: [N/A:N/A] Right, Anterior Lower Leg N/A N/A Wound Location: Gradually Appeared N/A N/A Wounding Event: Venous Leg Ulcer N/A N/A Primary Etiology: Sleep Apnea, Arrhythmia, N/A N/A Comorbid History: Hypertension, Peripheral Venous Disease, Osteoarthritis 07/01/2021 N/A N/A Date Acquired: 1 N/A N/A Weeks of Treatment: Open N/A N/A Wound Status: No N/A N/A Wound Recurrence: 8.1x4.4x0.1 N/A N/A Measurements L x W x D (cm) 27.992 N/A  N/A A (cm) : rea 2.799 N/A N/A Volume (cm) : 72.20% N/A N/A % Reduction in A rea: 72.20% N/A N/A % Reduction in Volume: Full Thickness Without Exposed N/A N/A Classification: Support Structures Medium N/A N/A Exudate A mount: Serous N/A N/A Exudate Type: amber N/A N/A Exudate Color: Distinct, outline attached N/A N/A Wound Margin: Large (67-100%) N/A N/A Granulation A mount: Red, Pink N/A N/A Granulation Quality: Small (1-33%) N/A N/A Necrotic A mount: Fat Layer (Subcutaneous Tissue): Yes N/A N/A Exposed Structures: Fascia: No Tendon: No Muscle: No Joint: No Bone: No Medium (34-66%) N/A N/A Epithelialization: Debridement - Excisional N/A N/A Debridement: Pre-procedure Verification/Time Out 11:54 N/A N/A Taken: Other N/A N/A Pain Control: Subcutaneous, Slough N/A N/A Tissue Debrided: Skin/Subcutaneous Tissue N/A N/A Level: 35.64 N/A N/A Debridement A (sq cm): rea Curette N/A N/A Instrument: Minimum N/A N/A Bleeding: Pressure N/A N/A Hemostasis A chieved: Procedure was tolerated well N/A N/A Debridement Treatment Response: 8.1x4.4x0.1 N/A N/A Post Debridement Measurements L x W x D (cm) 2.799 N/A N/A Post Debridement Volume: (cm) Debridement N/A N/A Procedures Performed: Treatment Notes Wound #1 (Lower Leg) Wound Laterality: Right,  Anterior Cleanser Soap and Water Discharge Instruction: May shower and wash wound with dial antibacterial soap and water prior to dressing change. Wound Cleanser Discharge Instruction: Cleanse the wound with wound cleanser prior to applying a clean dressing using gauze sponges, not tissue or cotton balls. Peri-Wound Care Triamcinolone 15 (g) Discharge Instruction: Use triamcinolone 15 (g) as directed Sween Lotion (Moisturizing lotion) Discharge Instruction: Apply moisturizing lotion as directed Topical Gentamicin Discharge Instruction: As directed by physician Mupirocin Ointment Discharge Instruction: Apply Mupirocin (Bactroban) as instructed Primary Dressing Maxorb Extra Calcium Alginate Dressing, 4x4 in Discharge Instruction: Apply calcium alginate to wound bed as instructed Secondary Dressing ABD Pad, 8x10 Discharge Instruction: Apply over primary dressing as directed. Zetuvit Plus 4x8 in Discharge Instruction: Apply over primary dressing as directed. Secured With Compression Wrap Kerlix Roll 4.5x3.1 (in/yd) Discharge Instruction: Apply Kerlix and Coban compression as directed. Coban Self-Adherent Wrap 4x5 (in/yd) Discharge Instruction: Apply over Kerlix as directed. Compression Stockings Add-Ons Electronic Signature(s) Signed: 07/29/2021 4:29:03 PM By: Kalman Shan DO Signed: 07/29/2021 4:54:47 PM By: Fara Chute By: Kalman Shan on 07/29/2021 16:21:12 -------------------------------------------------------------------------------- Multi-Disciplinary Care Plan Details Patient Name: Date of Service: [name], [name] 07/29/2021 11:00 A M Medical Record Number: [id number] Patient Account Number: [id number] Date of Birth/Sex: Treating RN: 22-May-1950 (71 y.o. [name] Primary Care Rokhaya Quinn: [name] Other Clinician: Referring Kamiryn Bezanson: Treating Vianny Schraeder/Extender: Delford Field in Treatment: 1 Active Inactive Abuse  / Safety / Falls / Self Care Management Nursing Diagnoses: Impaired physical mobility Goals: Patient will remain injury free related to falls Date Initiated: 07/22/2021 Target Resolution Date: 08/19/2021 Goal Status: Active Interventions: Assess fall risk on admission and as needed Assess: immobility, friction, shearing, incontinence upon admission and as needed Assess impairment of mobility on admission and as needed per policy Notes: Wound/Skin Impairment Nursing Diagnoses: Impaired tissue integrity Goals: Patient/caregiver will verbalize understanding of skin care regimen Date Initiated: 07/22/2021 Target Resolution Date: 08/19/2021 Goal Status: Active Ulcer/skin breakdown will have a volume reduction of 30% by week 4 Date Initiated: 07/22/2021 Target Resolution Date: 08/19/2021 Goal Status: Active Interventions: Assess patient/caregiver ability to obtain necessary supplies Assess patient/caregiver ability to perform ulcer/skin care regimen upon admission and as needed Assess ulceration(s) every visit Provide education on ulcer and skin care Treatment Activities: Topical wound management initiated : 07/22/2021 Notes: Electronic Signature(s)  Signed: 07/29/2021 4:54:47 PM By: Lorrin Jackson Entered By: Lorrin Jackson on 07/29/2021 11:47:19 -------------------------------------------------------------------------------- Pain Assessment Details Patient Name: Date of Service: [name] 07/29/2021 11:00 A M Medical Record Number: [id number] Patient Account Number: [id number] Date of Birth/Sex: Treating RN: Mar 13, 1950 (71 y.o. [name] Primary Care Fina Heizer: [name] Other Clinician: Referring Adrionna Delcid: Treating Wyatt Thorstenson/Extender: Delford Field in Treatment: 1 Active Problems Location of Pain Severity and Description of Pain Patient Has Paino No Site Locations Pain Management and Medication Current Pain Management: Electronic  Signature(s) Signed: 07/29/2021 4:54:47 PM By: Lorrin Jackson Signed: 08/17/2021 8:46:54 AM By: Erenest Blank Entered By: Erenest Blank on 07/29/2021 11:29:57 -------------------------------------------------------------------------------- Patient/Caregiver Education Details Patient Name: Date of Service: [name] 5/25/2023andnbsp11:00 A M Medical Record Number: [id number] Patient Account Number: [id number] Date of Birth/Gender: Treating RN: 10/23/1950 (71 y.o. [name] Primary Care Physician: [name] Other Clinician: Referring Physician: Treating Physician/Extender: Delford Field in Treatment: 1 Education Assessment Education Provided To: Patient Education Topics Provided Venous: Methods: Explain/Verbal, Printed Responses: State content correctly Wound/Skin Impairment: Methods: Explain/Verbal, Printed Responses: State content correctly Electronic Signature(s) Signed: 07/29/2021 4:54:47 PM By: Lorrin Jackson Entered By: Lorrin Jackson on 07/29/2021 11:47:39 -------------------------------------------------------------------------------- Wound Assessment Details Patient Name: Date of Service: [name] 07/29/2021 11:00 A M Medical Record Number: [id number] Patient Account Number: [id number] Date of Birth/Sex: Treating RN: 1951-03-05 (71 y.o. [name] Primary Care Henreitta Spittler: [name] Other Clinician: Referring Keaundre Thelin: Treating Balraj Brayfield/Extender: Delford Field in Treatment: 1 Wound Status Wound Number: 1 Primary Venous Leg Ulcer Etiology: Wound Location: Right, Anterior Lower Leg Wound Open Wounding Event: Gradually Appeared Status: Date Acquired: 07/01/2021 Comorbid Sleep Apnea, Arrhythmia, Hypertension, Peripheral Venous Weeks Of Treatment: 1 History: Disease, Osteoarthritis Clustered Wound: No Photos Wound Measurements Length: (cm) 8.1 Width: (cm) 4.4 Depth: (cm)  0.1 Area: (cm) 27.992 Volume: (cm) 2.799 % Reduction in Area: 72.2% % Reduction in Volume: 72.2% Epithelialization: Medium (34-66%) Tunneling: No Undermining: No Wound Description Classification: Full Thickness Without Exposed Support Structures Wound Margin: Distinct, outline attached Exudate Amount: Medium Exudate Type: Serous Exudate Color: amber Foul Odor After Cleansing: No Slough/Fibrino Yes Wound Bed Granulation Amount: Large (67-100%) Exposed Structure Granulation Quality: Red, Pink Fascia Exposed: No Necrotic Amount: Small (1-33%) Fat Layer (Subcutaneous Tissue) Exposed: Yes Necrotic Quality: Adherent Slough Tendon Exposed: No Muscle Exposed: No Joint Exposed: No Bone Exposed: No Electronic Signature(s) Signed: 07/29/2021 4:54:47 PM By: Lorrin Jackson Signed: 08/17/2021 8:46:54 AM By: Erenest Blank Entered By: Erenest Blank on 07/29/2021 11:47:05 -------------------------------------------------------------------------------- Vitals Details Patient Name: Date of Service: [name], [name] 07/29/2021 11:00 A M Medical Record Number: [id number] Patient Account Number: [id number] Date of Birth/Sex: Treating RN: [birth year] (71 y.o. [name] Primary Care Toren Tucholski: [name] Other Clinician: Referring Denice Cardon: Treating Karsen Fellows/Extender: Delford Field in Treatment: 1 Vital Signs Time Taken: 11:29 Temperature (F): 97.9 Height (in): 69 Pulse (bpm): 51 Weight (lbs): 271 Respiratory Rate (breaths/min): 18 Body Mass Index (BMI): 40 Blood Pressure (mmHg): 144/81 Reference Range: 80 - 120 mg / dl Electronic Signature(s) Signed: 08/17/2021 8:46:54 AM By: Erenest Blank Entered By: Erenest Blank on 07/29/2021 11:29:51

## 2021-08-17 NOTE — Progress Notes (Addendum)
ATTICUS, WEDIN ([id number]) Visit Report for 08/17/2021 Arrival Information Details Patient Name: Date of Service: [name] 08/17/2021 2:45 PM Medical Record Number: [id number] Patient Account Number: [id number] Date of Birth/Sex: Treating RN: 09-19-1950 (71 y.o. [name], [name] Primary Care [name]: [name] Other Clinician: Referring [name]: Treating [name]/Extender: [name] in Treatment: 3 Visit Information History Since Last Visit Added or deleted any medications: No Patient Arrived: Wheel Chair Any new allergies or adverse reactions: No Arrival Time: 14:56 Had a fall or experienced change in No Accompanied By: nephew activities of daily living that may affect Transfer Assistance: Manual risk of falls: Patient Identification Verified: Yes Signs or symptoms of abuse/neglect since last visito No Secondary Verification Process Completed: Yes Hospitalized since last visit: No Patient Requires Transmission-Based Precautions: No Implantable device outside of the clinic excluding No Patient Has Alerts: Yes cellular tissue based products placed in the center Patient Alerts: Patient on Blood Thinner since last visit: VVS ABI: R=.84 L=.81 Has Dressing in Place as Prescribed: Yes VVS TBI: R=.55 L=.67 Has Compression in Place as Prescribed: Yes Pain Present Now: No Electronic Signature(s) Signed: 08/19/2021 5:29:07 PM By: Blake Hammock RN Entered By: [name] on 08/17/2021 14:57:22 -------------------------------------------------------------------------------- Clinic Level of Care Assessment Details Patient Name: Date of Service: [name] [name]. 08/17/2021 2:45 PM Medical Record Number: [id number] Patient Account Number: [id number] Date of Birth/Sex: Treating RN: 08/11/1950 (71 y.o. [name] Primary Care [name]: [name] Other Clinician: Referring [name]: Treating [name]/Extender: [name] in Treatment: 3 Clinic Level of Care Assessment Items TOOL 4 Quantity Score X- 1 0 Use when only an EandM is performed on FOLLOW-UP visit ASSESSMENTS - Nursing Assessment / Reassessment X- 1 10 Reassessment of Co-morbidities (includes updates in patient status) X- 1 5 Reassessment of Adherence to Treatment Plan ASSESSMENTS - Wound and Skin A ssessment / Reassessment '[]'$  - 0 Simple Wound Assessment / Reassessment - one wound X- 2 5 Complex Wound Assessment / Reassessment - multiple wounds '[]'$  - 0 Dermatologic / Skin Assessment (not related to wound area) ASSESSMENTS - Focused Assessment '[]'$  - 0 Circumferential Edema Measurements - multi extremities '[]'$  - 0 Nutritional Assessment / Counseling / Intervention '[]'$  - 0 Lower Extremity Assessment (monofilament, tuning fork, pulses) '[]'$  - 0 Peripheral Arterial Disease Assessment (using hand held doppler) ASSESSMENTS - Ostomy and/or Continence Assessment and Care '[]'$  - 0 Incontinence Assessment and Management '[]'$  - 0 Ostomy Care Assessment and Management (repouching, etc.) PROCESS - Coordination of Care X - Simple Patient / Family Education for ongoing care 1 15 '[]'$  - 0 Complex (extensive) Patient / Family Education for ongoing care '[]'$  - 0 Staff obtains Programmer, systems, Records, T Results / Process Orders est '[]'$  - 0 Staff telephones HHA, Nursing Homes / Clarify orders / etc '[]'$  - 0 Routine Transfer to another Facility (non-emergent condition) '[]'$  - 0 Routine Hospital Admission (non-emergent condition) '[]'$  - 0 New Admissions / Biomedical engineer / Ordering NPWT Apligraf, etc. , '[]'$  - 0 Emergency Hospital Admission (emergent condition) X- 1 10 Simple Discharge Coordination '[]'$  - 0 Complex (extensive) Discharge Coordination PROCESS - Special Needs '[]'$  - 0 Pediatric / Minor Patient Management '[]'$  - 0 Isolation Patient Management '[]'$  - 0 Hearing / Language / Visual special needs '[]'$  - 0 Assessment of  Community assistance (transportation, [name]/C planning, etc.) '[]'$  - 0 Additional assistance / Altered mentation '[]'$  - 0 Support Surface(s) Assessment (bed, cushion, seat,  etc.) INTERVENTIONS - Wound Cleansing / Measurement '[]'$  - 0 Simple Wound Cleansing - one wound '[]'$  - 0 Complex Wound Cleansing - multiple wounds X- 1 5 Wound Imaging (photographs - any number of wounds) '[]'$  - 0 Wound Tracing (instead of photographs) '[]'$  - 0 Simple Wound Measurement - one wound '[]'$  - 0 Complex Wound Measurement - multiple wounds INTERVENTIONS - Wound Dressings '[]'$  - 0 Small Wound Dressing one or multiple wounds '[]'$  - 0 Medium Wound Dressing one or multiple wounds '[]'$  - 0 Large Wound Dressing one or multiple wounds '[]'$  - 0 Application of Medications - topical '[]'$  - 0 Application of Medications - injection INTERVENTIONS - Miscellaneous '[]'$  - 0 External ear exam '[]'$  - 0 Specimen Collection (cultures, biopsies, blood, body fluids, etc.) '[]'$  - 0 Specimen(s) / Culture(s) sent or taken to Lab for analysis '[]'$  - 0 Patient Transfer (multiple staff / Civil Service fast streamer / Similar devices) '[]'$  - 0 Simple Staple / Suture removal (25 or less) '[]'$  - 0 Complex Staple / Suture removal (26 or more) '[]'$  - 0 Hypo / Hyperglycemic Management (close monitor of Blood Glucose) '[]'$  - 0 Ankle / Brachial Index (ABI) - do not check if billed separately X- 1 5 Vital Signs Has the patient been seen at the hospital within the last three years: Yes Total Score: 60 Level Of Care: New/Established - Level 2 Electronic Signature(s) Signed: 08/17/2021 5:10:04 PM By: Lorrin Jackson Entered By: Lorrin Jackson on 08/17/2021 15:59:32 -------------------------------------------------------------------------------- Encounter Discharge Information Details Patient Name: Date of Service: [name], Blake Husk [name]. 08/17/2021 2:45 PM Medical Record Number: [id number] Patient Account Number: [id number] Date of Birth/Sex: Treating RN: 1950/06/10 (71 y.o. [name] Primary Care Briunna Leicht: [name] Other Clinician: Referring Patric Vanpelt: Treating Raegyn Renda/Extender: [name] in Treatment: 3 Encounter Discharge Information Items Discharge Condition: Stable Ambulatory Status: Wheelchair Discharge Destination: Home Transportation: Private Auto Accompanied ByAlanson Puls Schedule Follow-up Appointment: No Clinical Summary of Care: Provided on 08/17/2021 Form Type Recipient Paper Patient Patient Electronic Signature(s) Signed: 08/17/2021 5:10:04 PM By: Lorrin Jackson Entered By: Lorrin Jackson on 08/17/2021 16:00:35 -------------------------------------------------------------------------------- Lower Extremity Assessment Details Patient Name: Date of Service: Blake Lull [name]. 08/17/2021 2:45 PM Medical Record Number: [id number] Patient Account Number: [id number] Date of Birth/Sex: Treating RN: [birth year] (71 y.o. [name], [name] Primary Care Hina Gupta: [name] Other Clinician: Referring Terrell Ostrand: Treating Fabrizzio Marcella/Extender: [name] in Treatment: 3 Edema Assessment Assessed: [name]: Yes] Patrice Paradise: Yes] Edema: [Left: Yes] [Right: Yes] Calf Left: Right: Point of Measurement: 34 cm From Medial Instep 39 cm 40.5 cm Ankle Left: Right: Point of Measurement: 12 cm From Medial Instep 24 cm 24.5 cm Vascular Assessment Pulses: Dorsalis Pedis Palpable: [Left:Yes] [Right:Yes] Electronic Signature(s) Signed: 08/19/2021 5:29:07 PM By: Blake Hammock RN Entered By: [name] on 08/17/2021 14:56:28 -------------------------------------------------------------------------------- Multi Wound Chart Details Patient Name: Date of Service: [name], Blake Husk [name]. 08/17/2021 2:45 PM Medical Record Number: [id number] Patient Account Number: [id number] Date of Birth/Sex: Treating RN: February 03, 1951 (71 y.o. [name] Primary Care Mandi Mattioli: [name] Other Clinician: Referring  Mainor Hellmann: Treating Momina Hunton/Extender: [name] in Treatment: 3 Vital Signs Height(in): 69 Pulse(bpm): 94 Weight(lbs): 271 Blood Pressure(mmHg): 160/74 Body Mass Index(BMI): 40 Temperature(F): 98.8 Respiratory Rate(breaths/min): 17 Photos: [1:No Photos Right, Anterior Lower Leg] [2:No Photos Right, Proximal, Anterior Lower Leg] [N/A:N/A N/A] Wound Location: [1:Gradually Appeared] [2:Gradually Appeared] [N/A:N/A] Wounding Event: [1:Venous Leg Ulcer] [2:Venous Leg Ulcer] [N/A:N/A] Primary  Etiology: [1:Sleep Apnea, Arrhythmia,] [2:Sleep Apnea, Arrhythmia,] [N/A:N/A] Comorbid History: [1:Hypertension, Peripheral Venous Disease, Osteoarthritis 07/01/2021] [2:Hypertension, Peripheral Venous Disease, Osteoarthritis 08/06/2021] [N/A:N/A] Date Acquired: [1:3] [2:1] [N/A:N/A] Weeks of Treatment: [1:Healed - Epithelialized] [2:Healed - Epithelialized] [N/A:N/A] Wound Status: [1:No] [2:No] [N/A:N/A] Wound Recurrence: [1:0x0x0] [2:0x0x0] [N/A:N/A] Measurements L x W x [name] (cm) [1:0] [2:0] [N/A:N/A] A (cm) : rea [1:0] [2:0] [N/A:N/A] Volume (cm) : [1:100.00%] [2:100.00%] [N/A:N/A] % Reduction in Area: [1:100.00%] [2:100.00%] [N/A:N/A] % Reduction in Volume: [1:Full Thickness Without Exposed] [2:Full Thickness Without Exposed] [N/A:N/A] Classification: [1:Support Structures] [2:Support Structures] Treatment Notes Electronic Signature(s) Signed: 08/17/2021 5:27:49 PM By: Kalman Shan DO Signed: 08/18/2021 5:21:51 PM By: Lorrin Jackson Entered By: Kalman Shan on 08/17/2021 17:17:40 -------------------------------------------------------------------------------- Multi-Disciplinary Care Plan Details Patient Name: Date of Service: [name], Blake Husk [name]. 08/17/2021 2:45 PM Medical Record Number: [id number] Patient Account Number: [id number] Date of Birth/Sex: Treating RN: 1950/06/20 (71 y.o. [name] Primary Care Ismaeel Arvelo: [name] Other  Clinician: Referring Zemira Zehring: Treating Debbie Bellucci/Extender: [name] in Treatment: 3 Active Inactive Electronic Signature(s) Signed: 08/17/2021 5:10:04 PM By: Lorrin Jackson Previous Signature: 08/17/2021 2:47:16 PM Version By: Lorrin Jackson Entered By: Lorrin Jackson on 08/17/2021 15:58:47 -------------------------------------------------------------------------------- Pain Assessment Details Patient Name: Date of Service: Blake Lull [name]. 08/17/2021 2:45 PM Medical Record Number: [id number] Patient Account Number: [id number] Date of Birth/Sex: Treating RN: 1950/03/26 (71 y.o. [name], [name] Primary Care Hafiz Irion: [name] Other Clinician: Referring Kallyn Demarcus: Treating Keawe Marcello/Extender: [name] in Treatment: 3 Active Problems Location of Pain Severity and Description of Pain Patient Has Paino No Site Locations Pain Management and Medication Current Pain Management: Electronic Signature(s) Signed: 08/19/2021 5:29:07 PM By: Blake Hammock RN Entered By: [name] on 08/17/2021 14:58:49 -------------------------------------------------------------------------------- Patient/Caregiver Education Details Patient Name: Date of Service: Blake Lull [name]. 6/13/2023andnbsp2:45 PM Medical Record Number: [id number] Patient Account Number: [id number] Date of Birth/Gender: Treating RN: [birth year] (71 y.o. [name] Primary Care Physician: [name] Other Clinician: Referring Physician: Treating Physician/Extender: [name] in Treatment: 3 Education Assessment Education Provided To: Patient Education Topics Provided Venous: Methods: Explain/Verbal, Printed Responses: State content correctly Wound/Skin Impairment: Electronic Signature(s) Signed: 08/17/2021 5:10:04 PM By: Lorrin Jackson Entered By: Lorrin Jackson on 08/17/2021  16:01:02 -------------------------------------------------------------------------------- Wound Assessment Details Patient Name: Date of Service: Blake Lull [name]. 08/17/2021 2:45 PM Medical Record Number: [id number] Patient Account Number: [id number] Date of Birth/Sex: Treating RN: 30-Jan-1951 (71 y.o. [name], [name] Primary Care Niranjan Rufener: [name] Other Clinician: Referring Danthony Kendrix: Treating Martisha Toulouse/Extender: [name] in Treatment: 3 Wound Status Wound Number: 1 Primary Venous Leg Ulcer Etiology: Wound Location: Right, Anterior Lower Leg Wound Healed - Epithelialized Wounding Event: Gradually Appeared Status: Date Acquired: 07/01/2021 Comorbid Sleep Apnea, Arrhythmia, Hypertension, Peripheral Venous Weeks Of Treatment: 3 History: Disease, Osteoarthritis Clustered Wound: No Wound Measurements Length: (cm) Width: (cm) Depth: (cm) Area: (cm) Volume: (cm) 0 % Reduction in Area: 100% 0 % Reduction in Volume: 100% 0 0 0 Wound Description Classification: Full Thickness Without Exposed Support Structur es Electronic Signature(s) Signed: 08/17/2021 5:10:04 PM By: Lorrin Jackson Signed: 08/19/2021 5:29:07 PM By: Blake Hammock RN Entered By: Lorrin Jackson on 08/17/2021 15:58:01 -------------------------------------------------------------------------------- Wound Assessment Details Patient Name: Date of Service: Blake Lull [name]. 08/17/2021 2:45 PM Medical Record Number: [id number] Patient Account Number: [id number] Date of Birth/Sex: Treating RN: 28-Mar-1950 (71 y.o. [name], [name] Primary Care Abbie Jablon: Sarajane Jews,  Ishmael Holter Other Clinician: Referring Teshara Moree: Treating Yelitza Reach/Extender: [name] in Treatment: 3 Wound Status Wound Number: 2 Primary Venous Leg Ulcer Etiology: Wound Location: Right, Proximal, Anterior Lower Leg Wound Healed - Epithelialized Wounding Event: Gradually  Appeared Status: Date Acquired: 08/06/2021 Comorbid Sleep Apnea, Arrhythmia, Hypertension, Peripheral Venous Weeks Of Treatment: 1 History: Disease, Osteoarthritis Clustered Wound: No Wound Measurements Length: (cm) Width: (cm) Depth: (cm) Area: (cm) Volume: (cm) 0 % Reduction in Area: 100% 0 % Reduction in Volume: 100% 0 0 0 Wound Description Classification: Full Thickness Without Exposed Support Structur es Electronic Signature(s) Signed: 08/17/2021 5:10:04 PM By: Lorrin Jackson Signed: 08/19/2021 5:29:07 PM By: Blake Hammock RN Entered By: Lorrin Jackson on 08/17/2021 15:58:11 -------------------------------------------------------------------------------- Vitals Details Patient Name: Date of Service: [name], Blake Husk [name]. 08/17/2021 2:45 PM Medical Record Number: [id number] Patient Account Number: [id number] Date of Birth/Sex: Treating RN: 08-Aug-1950 (71 y.o. [name], [name] Primary Care Shaquon Gropp: [name] Other Clinician: Referring Yoseph Haile: Treating Yehudis Monceaux/Extender: [name] in Treatment: 3 Vital Signs Time Taken: 14:59 Temperature (F): 98.8 Height (in): 69 Pulse (bpm): 94 Weight (lbs): 271 Respiratory Rate (breaths/min): 17 Body Mass Index (BMI): 40 Blood Pressure (mmHg): 160/74 Reference Range: 80 - 120 mg / dl Electronic Signature(s) Signed: 08/19/2021 5:29:07 PM By: Blake Hammock RN Entered By: [name] on 08/17/2021 15:01:44

## 2021-08-18 ENCOUNTER — Ambulatory Visit (INDEPENDENT_AMBULATORY_CARE_PROVIDER_SITE_OTHER): Payer: PPO

## 2021-08-18 DIAGNOSIS — Z5181 Encounter for therapeutic drug level monitoring: Secondary | ICD-10-CM | POA: Diagnosis not present

## 2021-08-18 DIAGNOSIS — I4891 Unspecified atrial fibrillation: Secondary | ICD-10-CM

## 2021-08-18 LAB — POCT INR: INR: 2.8 (ref 2.0–3.0)

## 2021-08-18 NOTE — Progress Notes (Signed)
[name] ([id number]) Visit Report for 08/17/2021 Chief Complaint Document Details Patient Name: Date of Service: [name] 08/17/2021 2:45 PM Medical Record Number: [id number] Patient Account Number: [id number] Date of Birth/Sex: Treating RN: 1950/09/15 (71 y.o. [name] Primary Care Provider: [name] Other Clinician: Referring Provider: Treating Provider/Extender: Delford Field in Treatment: 3 Information Obtained from: Patient Chief Complaint 07/22/2021; right lower extremity wound Electronic Signature(s) Signed: 08/17/2021 5:27:49 PM By: Kalman Shan DO Entered By: Kalman Shan on 08/17/2021 17:17:49 -------------------------------------------------------------------------------- HPI Details Patient Name: Date of Service: [name], Blake Husk D. 08/17/2021 2:45 PM Medical Record Number: [id number] Patient Account Number: [id number] Date of Birth/Sex: Treating RN: [birth year] (71 y.o. [name] Primary Care Provider: [name] Other Clinician: Referring Provider: Treating Provider/Extender: Delford Field in Treatment: 3 History of Present Illness HPI Description: Admission 07/22/2021 [name] is a 71 year old male with a past medical history of OSA, aortic stenosis, permanent A-fib on Coumadin that presents to the clinic for a 7-week history of nonhealing ulcer to the right lower extremity. He states this wound developed spontaneously. He has been using Neosporin to the wound bed and keeping the area covered with absorbent pads and Kerlix. He denies signs of infection. He reports serous drainage. The only diurectic he takes is 12.5 mg of hydrochlorothiazide for his blood pressure. He does not wear daily compression stockings. 5/25; patient presents for follow-up. He tolerated the compression wrap well. We have been using antibiotic ointment with calcium alginate under Kerlix/Coban. He is  obtaining his ABIs next week. He has no issues or complaints today. 6/2; Patient presents for follow up. He reports tolerating the compression wrap well. We have been using antibiotic ointment with calcium alginate under kerlix/coban. He has a new wound just proximal to the original wounds. Its unclear how this developed. He had ABIs with TBIs last week that showed a right ABI of 0.84 and TBI of 0.55 with triphasic wave forms at the PTA and biphasic wave forms at the [facility]. 6/13; patient presents for follow-up. He has no issues or complaints today. He has not obtained his compression stockings. We have been using collagen with mupirocin/gentamicin under Kerlix/Coban. Electronic Signature(s) Signed: 08/17/2021 5:27:49 PM By: Kalman Shan DO Entered By: Kalman Shan on 08/17/2021 17:18:32 -------------------------------------------------------------------------------- Physical Exam Details Patient Name: Date of Service: Blake Lull D. 08/17/2021 2:45 PM Medical Record Number: [id number] Patient Account Number: [id number] Date of Birth/Sex: Treating RN: July 01, 1950 (71 y.o. [name] Primary Care Provider: [name] Other Clinician: Referring Provider: Treating Provider/Extender: Delford Field in Treatment: 3 Constitutional respirations regular, non-labored and within target range for patient.. Cardiovascular 2+ dorsalis pedis/posterior tibialis pulses. Psychiatric pleasant and cooperative. Notes Right lower extremity: Decent edema control. Epithelization to the previous wound site. Electronic Signature(s) Signed: 08/17/2021 5:27:49 PM By: Kalman Shan DO Entered By: Kalman Shan on 08/17/2021 17:20:19 -------------------------------------------------------------------------------- Physician Orders Details Patient Name: Date of Service: [name], Blake Husk D. 08/17/2021 2:45 PM Medical Record Number: [id number] Patient Account Number:  [id number] Date of Birth/Sex: Treating RN: 1950/07/23 (71 y.o. [name] Primary Care Provider: [name] Other Clinician: Referring Provider: Treating Provider/Extender: Delford Field in Treatment: 3 Verbal / Phone Orders: No Diagnosis Coding ICD-10 Coding Code Description [phone] Non-pressure chronic ulcer of other part of right lower leg with fat layer exposed I87.311 Chronic venous hypertension (idiopathic) with  ulcer of right lower extremity G47.33 Obstructive sleep apnea (adult) (pediatric) I48.21 Permanent atrial fibrillation Discharge From [facility] Services Discharge from [place] - No further follow up needed, call if any issues. Edema Control - Lymphedema / SCD / Other Elevate legs to the level of the heart or above for 30 minutes daily and/or when sitting, a frequency of: - throughout the day Avoid standing for long periods of time. Patient to wear own compression stockings every day. - Obtain compression stockings from [employer]. Apply first thing in morning and remove at bedtime. Moisturize legs daily. - Apply lotion in evening after removing compression stockings. Compression stocking or Garment 20-30 mm/Hg pressure to: Electronic Signature(s) Signed: 08/17/2021 5:27:49 PM By: Kalman Shan DO Previous Signature: 08/17/2021 5:10:04 PM Version By: Lorrin Jackson Entered By: Kalman Shan on 08/17/2021 17:20:27 -------------------------------------------------------------------------------- Problem List Details Patient Name: Date of Service: [name], Blake Husk D. 08/17/2021 2:45 PM Medical Record Number: [id number] Patient Account Number: [id number] Date of Birth/Sex: Treating RN: 1951-01-08 (71 y.o. [name] Primary Care Provider: [name] Other Clinician: Referring Provider: Treating Provider/Extender: Delford Field in Treatment: 3 Active Problems ICD-10 Encounter Code  Description Active Date MDM Diagnosis [phone] Non-pressure chronic ulcer of other part of right lower leg with fat layer 07/22/2021 No Yes exposed I87.311 Chronic venous hypertension (idiopathic) with ulcer of right lower extremity 07/22/2021 No Yes G47.33 Obstructive sleep apnea (adult) (pediatric) 07/22/2021 No Yes I48.21 Permanent atrial fibrillation 07/22/2021 No Yes Inactive Problems Resolved Problems Electronic Signature(s) Signed: 08/17/2021 5:27:49 PM By: Kalman Shan DO Previous Signature: 08/17/2021 5:10:04 PM Version By: Lorrin Jackson Previous Signature: 08/17/2021 2:46:51 PM Version By: Lorrin Jackson Entered By: Kalman Shan on 08/17/2021 17:17:16 -------------------------------------------------------------------------------- Progress Note Details Patient Name: Date of Service: [name], Blake Husk D. 08/17/2021 2:45 PM Medical Record Number: [id number] Patient Account Number: [id number] Date of Birth/Sex: Treating RN: [birth year] (71 y.o. [name] Primary Care Provider: [name] Other Clinician: Referring Provider: Treating Provider/Extender: Delford Field in Treatment: 3 Subjective Chief Complaint Information obtained from Patient 07/22/2021; right lower extremity wound History of Present Illness (HPI) Admission 07/22/2021 [name] is a 71 year old male with a past medical history of OSA, aortic stenosis, permanent A-fib on Coumadin that presents to the clinic for a 7-week history of nonhealing ulcer to the right lower extremity. He states this wound developed spontaneously. He has been using Neosporin to the wound bed and keeping the area covered with absorbent pads and Kerlix. He denies signs of infection. He reports serous drainage. The only diurectic he takes is 12.5 mg of hydrochlorothiazide for his blood pressure. He does not wear daily compression stockings. 5/25; patient presents for follow-up. He tolerated the  compression wrap well. We have been using antibiotic ointment with calcium alginate under Kerlix/Coban. He is obtaining his ABIs next week. He has no issues or complaints today. 6/2; Patient presents for follow up. He reports tolerating the compression wrap well. We have been using antibiotic ointment with calcium alginate under kerlix/coban. He has a new wound just proximal to the original wounds. Its unclear how this developed. He had ABIs with TBIs last week that showed a right ABI of 0.84 and TBI of 0.55 with triphasic wave forms at the PTA and biphasic wave forms at the [facility]. 6/13; patient presents for follow-up. He has no issues or complaints today. He has not obtained his compression stockings. We have been using collagen with  mupirocin/gentamicin under Kerlix/Coban. Patient History Information obtained from Patient, Chart. Family History Heart Disease - Father, Hypertension - Father, Stroke - Father, No family history of Cancer, Diabetes, Hereditary Spherocytosis, Kidney Disease, Lung Disease, Seizures, Thyroid Problems, Tuberculosis. Social History Former smoker, Marital Status - Single, Alcohol Use - Never, Drug Use - No History, Caffeine Use - Never. Medical History Respiratory Patient has history of Sleep Apnea Cardiovascular Patient has history of Arrhythmia, Hypertension, Peripheral Venous Disease Musculoskeletal Patient has history of Osteoarthritis Medical A Surgical History Notes nd Cardiovascular Aortic Stenosis-Mitral Valve Regurg. Objective Constitutional respirations regular, non-labored and within target range for patient.. Vitals Time Taken: 2:59 PM, Height: 69 in, Weight: 271 lbs, BMI: 40, Temperature: 98.8 F, Pulse: 94 bpm, Respiratory Rate: 17 breaths/min, Blood Pressure: 160/74 mmHg. Cardiovascular 2+ dorsalis pedis/posterior tibialis pulses. Psychiatric pleasant and cooperative. General Notes: Right lower extremity: Decent edema control. Epithelization to  the previous wound site. Integumentary (Hair, Skin) Wound #1 status is Healed - Epithelialized. Original cause of wound was Gradually Appeared. The date acquired was: 07/01/2021. The wound has been in treatment 3 weeks. The wound is located on the Right,Anterior Lower Leg. The wound measures 0cm length x 0cm width x 0cm depth; 0cm^2 area and 0cm^3 volume. Wound #2 status is Healed - Epithelialized. Original cause of wound was Gradually Appeared. The date acquired was: 08/06/2021. The wound has been in treatment 1 weeks. The wound is located on the Right,Proximal,Anterior Lower Leg. The wound measures 0cm length x 0cm width x 0cm depth; 0cm^2 area and 0cm^3 volume. Assessment Active Problems ICD-10 Non-pressure chronic ulcer of other part of right lower leg with fat layer exposed Chronic venous hypertension (idiopathic) with ulcer of right lower extremity Obstructive sleep apnea (adult) (pediatric) Permanent atrial fibrillation Patient has done well with collagen and mupirocin/gentamicin under Kerlix/Coban. His wounds have healed. We gave him information to obtain compression stockings at last clinic visit. I recommended he call and order these. He may follow-up as needed. Plan Discharge From [facility] Services: Discharge from [place] - No further follow up needed, call if any issues. Edema Control - Lymphedema / SCD / Other: Elevate legs to the level of the heart or above for 30 minutes daily and/or when sitting, a frequency of: - throughout the day Avoid standing for long periods of time. Patient to wear own compression stockings every day. - Obtain compression stockings from [employer]. Apply first thing in morning and remove at bedtime. Moisturize legs daily. - Apply lotion in evening after removing compression stockings. Compression stocking or Garment 20-30 mm/Hg pressure to: 1. Discharge from clinic due to closed wound 2. Follow-up as needed 3. Daily compression  stockings Electronic Signature(s) Signed: 08/17/2021 5:27:49 PM By: Kalman Shan DO Entered By: Kalman Shan on 08/17/2021 17:21:46 -------------------------------------------------------------------------------- HxROS Details Patient Name: Date of Service: [name], Blake Husk D. 08/17/2021 2:45 PM Medical Record Number: [id number] Patient Account Number: [id number] Date of Birth/Sex: Treating RN: 03-16-1950 (71 y.o. [name] Primary Care Provider: [name] Other Clinician: Referring Provider: Treating Provider/Extender: Delford Field in Treatment: 3 Information Obtained From Patient Chart Respiratory Medical History: Positive for: Sleep Apnea Cardiovascular Medical History: Positive for: Arrhythmia; Hypertension; Peripheral Venous Disease Past Medical History Notes: Aortic Stenosis-Mitral Valve Regurg. Musculoskeletal Medical History: Positive for: Osteoarthritis Immunizations Pneumococcal Vaccine: Received Pneumococcal Vaccination: No Implantable Devices None Family and Social History Cancer: No; Diabetes: No; Heart Disease: Yes - Father; Hereditary Spherocytosis: No; Hypertension: Yes - Father; Kidney Disease:  No; Lung Disease: No; Seizures: No; Stroke: Yes - Father; Thyroid Problems: No; Tuberculosis: No; Former smoker; Marital Status - Single; Alcohol Use: Never; Drug Use: No History; Caffeine Use: Never; Financial Concerns: No; Food, Clothing or Shelter Needs: No; Support System Lacking: No; Transportation Concerns: No Electronic Signature(s) Signed: 08/17/2021 5:27:49 PM By: Kalman Shan DO Signed: 08/18/2021 5:21:51 PM By: Lorrin Jackson Entered By: Kalman Shan on 08/17/2021 17:18:38 -------------------------------------------------------------------------------- SuperBill Details Patient Name: Date of Service: [name], Blake Husk D. 08/17/2021 Medical Record Number: [id number] Patient Account Number: [id number] Date of  Birth/Sex: Treating RN: Sep 08, 1950 (71 y.o. [name] Primary Care Provider: [name] Other Clinician: Referring Provider: Treating Provider/Extender: Delford Field in Treatment: 3 Diagnosis Coding ICD-10 Codes Code Description [phone] Non-pressure chronic ulcer of other part of right lower leg with fat layer exposed I87.311 Chronic venous hypertension (idiopathic) with ulcer of right lower extremity G47.33 Obstructive sleep apnea (adult) (pediatric) I48.21 Permanent atrial fibrillation Facility Procedures CPT4 Code: [id number] Description: 9212 - WOUND CARE VISIT-LEV 2 EST PT Modifier: Quantity: 1 Physician Procedures : CPT4 Code Description Modifier [id number] - WC PHYS LEVEL 3 - EST PT ICD-10 Diagnosis Description U72.536 Non-pressure chronic ulcer of other 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

## 2021-08-18 NOTE — Patient Instructions (Signed)
Description   Continue taking Warfarin 1.5 tablets (7.5mg) daily. Call us with any new medications scheduled for any procedures or any questions. Recheck in 8 weeks. Phone [phone] [facility] Main # [phone] fax [phone]     

## 2021-09-20 ENCOUNTER — Other Ambulatory Visit: Payer: Self-pay | Admitting: Family Medicine

## 2021-09-20 DIAGNOSIS — I10 Essential (primary) hypertension: Secondary | ICD-10-CM

## 2021-10-08 ENCOUNTER — Ambulatory Visit (HOSPITAL_COMMUNITY): Payer: PPO | Attending: Cardiology

## 2021-10-08 DIAGNOSIS — I340 Nonrheumatic mitral (valve) insufficiency: Secondary | ICD-10-CM | POA: Diagnosis not present

## 2021-10-08 DIAGNOSIS — I4821 Permanent atrial fibrillation: Secondary | ICD-10-CM | POA: Diagnosis not present

## 2021-10-08 DIAGNOSIS — R001 Bradycardia, unspecified: Secondary | ICD-10-CM | POA: Diagnosis not present

## 2021-10-08 DIAGNOSIS — I10 Essential (primary) hypertension: Secondary | ICD-10-CM | POA: Diagnosis not present

## 2021-10-08 DIAGNOSIS — I350 Nonrheumatic aortic (valve) stenosis: Secondary | ICD-10-CM | POA: Diagnosis not present

## 2021-10-08 LAB — ECHOCARDIOGRAM COMPLETE
AR max vel: 1.05 cm2
AV Area VTI: 1.01 cm2
AV Area mean vel: 1.15 cm2
AV Mean grad: 28.8 mmHg
AV Peak grad: 49 mmHg
Ao pk vel: 3.5 m/s
Area-P 1/2: 3.99 cm2
MV M vel: 6.44 m/s
MV Peak grad: 165.6 mmHg
MV VTI: 2 cm2
P 1/2 time: 541 msec
Radius: 0.5 cm
S' Lateral: 3.25 cm

## 2021-10-10 ENCOUNTER — Encounter: Payer: Self-pay | Admitting: Physician Assistant

## 2021-10-13 ENCOUNTER — Ambulatory Visit (INDEPENDENT_AMBULATORY_CARE_PROVIDER_SITE_OTHER): Payer: PPO | Admitting: *Deleted

## 2021-10-13 DIAGNOSIS — I4891 Unspecified atrial fibrillation: Secondary | ICD-10-CM | POA: Diagnosis not present

## 2021-10-13 DIAGNOSIS — Z5181 Encounter for therapeutic drug level monitoring: Secondary | ICD-10-CM

## 2021-10-13 LAB — POCT INR: INR: 2.7 (ref 2.0–3.0)

## 2021-10-13 NOTE — Patient Instructions (Signed)
Description   Continue taking Warfarin 1.5 tablets (7.'5mg'$ ) daily. Call us with any new medications scheduled for any procedures or any questions. Recheck in 8 weeks. Phone [phone] [facility] Main # [phone] fax [phone]

## 2021-10-26 NOTE — Progress Notes (Signed)
Cardiology Office Note   Date:  10/28/2021   ID:  [name], DOB [birth year], MRN [id number]  PCP:  [name], MD  Cardiologist:   [name], MD   Patient presents for follow up of AS, MR     History of Present Illness: [name] is a 71 y.o. male with a history of permanent atrial fibrillation, mitral regurgitation, aortic stenosis, HTN, bradycardia,  HL, morbid obesity and OSA.   I saw him a few years ago in clinic   He was last seen by [name] in cardiology in November 2022. The pt lives at home   Gets a lot of help from his nephrew.  Not very active   Denies CP   Says his breathing is OK   Denies palpitations   Denies dizziness BP at home 431V to 400Q/ systolic       Current Meds  Medication Sig   amLODipine (NORVASC) 10 MG tablet Take 1 tablet (10 mg total) by mouth daily.   carvedilol (COREG) 12.5 MG tablet TAKE 1 AND 1/2 TABLET BY MOUTH TWO TIMES A DAY   cloNIDine (CATAPRES) 0.2 MG tablet Take 1 tablet (0.2 mg total) by mouth 2 (two) times daily.   doxazosin (CARDURA) 4 MG tablet Take 1 tablet (4 mg total) by mouth daily.   ezetimibe (ZETIA) 10 MG tablet Take 1 tablet (10 mg total) by mouth daily.   hydrochlorothiazide (MICROZIDE) 12.5 MG capsule Take 1 capsule (12.5 mg total) by mouth daily.   Iron, Ferrous Sulfate, 325 (65 Fe) MG TABS Take 325 mg by mouth daily.   ketoconazole (NIZORAL) 2 % cream Apply 1 application topically 2 (two) times daily as needed for irritation.   losartan (COZAAR) 100 MG tablet Take 1 tablet (100 mg total) by mouth daily.   lubiprostone (AMITIZA) 24 MCG capsule Take 1 capsule (24 mcg total) by mouth 2 (two) times daily.   meloxicam (MOBIC) 15 MG tablet Take 1 tablet (15 mg total) by mouth daily.   OMEGA-3 FATTY ACIDS PO Take 1,200 mg by mouth in the morning and at bedtime.   warfarin (COUMADIN) 5 MG tablet TAKE 1 TO 1 AND 1/2 TABLETS BY MOUTH DAILY AS DIRECTED BY COUMADIN CLINIC     Allergies:   Statins and Penicillins   Past  Medical History:  Diagnosis Date   Adenomatous colon polyp 07/2003   Heart valve problem    Dr [name]   Hypertension    Mitral valve disorders(424.0)    Nonrheumatic aortic valve stenosis 01/14/2021   Echo 9/22: EF 60-65, small pericardial effusion, mild AS (mean gradient 16 mmHg), trivial AI, mild MR (MR is eccentric - may be underestimated) // Echo 10/2021: EF 60-65, mod calcification of AV, trivial AI, mod-severe AS (DI 0.25, V-max 350 cm/s, mean 28.8), no RWMA, NL RVSF, severe LAE, mild RAE, small effusion w/o tamponade, ?mild-moderate MR (difficult due to eccentric jet)   Nonrheumatic mitral valve regurgitation 01/14/2021   Echo 10/2021: EF 60-65, moderate calcification of the aortic valve, trivial AI, moderate to severe aortic stenosis (DI 0.25, V-max 350 cm/s, mean gradient 28.8), no RWMA, normal RVSF, severe LAE, mild RAE, small pericardial effusion without tamponade, suspect mild-moderate MR (difficult to qualify degree due to eccentric jet with coanda affect)   Obesity    OSA (obstructive sleep apnea)    cpap   Other and unspecified hyperlipidemia    Permanent atrial fibrillation ([place])    Zio Monitor 5/22: AFib -  Avg HR 55; slowest 32; longest pause 3.9 sec; occ PVCs, 4 beat NSVT   Sleep apnea     Past Surgical History:  Procedure Laterality Date   COLONOSCOPY  06/09/2015   per Dr. Fuller Plan, adenomatous polyp, repeat in 7 yrs   FOOT SURGERY  at age 74   LUMBAR LAMINECTOMY/DECOMPRESSION MICRODISCECTOMY N/A 11/21/2012   Procedure: Lumbar Three to Lumbar Five Laminectomy;  Surgeon: Floyce Stakes, MD;  Location: [facility];  Service: Neurosurgery;  Laterality: N/A;  Lumbar Three to Lumbar Five Laminectomy     Social History:  The patient  reports that he has never smoked. He has never used smokeless tobacco. He reports that he does not drink alcohol and does not use drugs.   Family History:  The patient's family history includes Alzheimer's disease in his maternal aunt and  mother; Heart disease in his father; Hypertension in his father; Stroke in his father.    ROS:  Please see the history of present illness. All other systems are reviewed and  Negative to the above problem except as noted.    PHYSICAL EXAM: VS:  BP (!) 150/90   Pulse 63   Ht '5\' 10"'$  (1.778 m)   Wt 263 lb (119.3 kg)   BMI 37.74 kg/m   BP on my check 138/82  GEN: Morbidly obese 71 yo  in no acute distress   Examined in wheelchair HEENT: normal  Neck: no JVD, carotid bruits,  Cardiac: RRR; Gr III/VI systolic murmur at base    Legs wrapped  Tr edema at ankles   Respiratory:  clear to auscultation bilaterally, normal work of breathing GI: Distended    Nontender    MS: no deformity Moving all extremities   Skin: warm and dry, no rash Neuro:  Strength and sensation are intact Psych: euthymic mood, full affect     EKG:  EKG is not ordered today.   Lipid Panel    Component Value Date/Time   CHOL 147 07/15/2021 0908   CHOL 163 03/29/2019 1655   TRIG 103.0 07/15/2021 0908   HDL 34.10 (L) 07/15/2021 0908   HDL 40 03/29/2019 1655   CHOLHDL 4 07/15/2021 0908   VLDL 20.6 07/15/2021 0908   LDLCALC 92 07/15/2021 0908   LDLCALC 103 (H) 03/29/2019 1655   LDLDIRECT 168.0 03/25/2014 0844      Wt Readings from Last 3 Encounters:  10/27/21 263 lb (119.3 kg)  07/15/21 271 lb (122.9 kg)  03/18/21 267 lb (121.1 kg)      ASSESSMENT AND PLAN:  1  Aortic stenosis  I have reviewed echo images with patient and nephew   Difficult acoustic windows but valve looks severely narrowed  Dimensionless index is 0.26 and mean gradient is 29 mm Hg     ON questioning he denies symptoms     I would recomm repeat echo this winter to reevaluate     2  Mitral regurgitation  Assessment has been difficult due to acoustic windows and eccentric jet     Will review on next TTE  Consider TEE to define further at that time    3  HTN  BP was high on arrival, patient said he had to use the restroom  Improved  with sitting     He says it is 120s at home usually.    Recomm he continue to track and also recomm he take cuff to the next medical visit he goes to to make sure it is accurate  4  Atrial fibrillation   Permanent   Keep on rate control    5   LIpids   Last LDL 92  HDL 34  Trig 103    Follow up this winter     Current medicines are reviewed at length with the patient today.  The patient does not have concerns regarding medicines.  Signed, [name], MD  10/28/2021 7:35 PM    [address], [place], [place]  [address] Phone: [phone]; Fax: [phone]

## 2021-10-27 ENCOUNTER — Ambulatory Visit: Payer: PPO | Admitting: Internal Medicine

## 2021-10-27 ENCOUNTER — Encounter: Payer: Self-pay | Admitting: Internal Medicine

## 2021-10-27 VITALS — BP 138/82 | HR 63 | Ht 70.0 in | Wt 263.0 lb

## 2021-10-27 DIAGNOSIS — I350 Nonrheumatic aortic (valve) stenosis: Secondary | ICD-10-CM

## 2021-10-27 NOTE — Patient Instructions (Signed)
Medication Instructions:   *If you need a refill on your cardiac medications before your next appointment, please call your pharmacy*   Lab Work:  If you have labs (blood work) drawn today and your tests are completely normal, you will receive your results only by: [place] (if you have MyChart) OR A paper copy in the mail If you have any lab test that is abnormal or we need to change your treatment, we will call you to review the results.   Testing/Procedures: End of NOVEMBER Your physician has requested that you have an echocardiogram. Echocardiography is a painless test that uses sound waves to create images of your heart. It provides your doctor with information about the size and shape of your heart and how well your heart's chambers and valves are working. This procedure takes approximately one hour. There are no restrictions for this procedure.    Follow-Up: At [facility], you and your health needs are our priority.  As part of our continuing mission to provide you with exceptional heart care, we have created designated Provider Care Teams.  These Care Teams include your primary Cardiologist (physician) and Advanced Practice Providers (APPs -  Physician Assistants and Nurse Practitioners) who all work together to provide you with the care you need, when you need it.  We recommend signing up for the patient portal called "MyChart".  Sign up information is provided on this After Visit Summary.  MyChart is used to connect with patients for Virtual Visits (Telemedicine).  Patients are able to view lab/test results, encounter notes, upcoming appointments, etc.  Non-urgent messages can be sent to your provider as well.   To learn more about what you can do with MyChart, go to [email or web address].    Your next appointment:   4 month(s)  The format for your next appointment:   In Person  Provider:   [name], MD     Other Instructions   Important Information About  [place]

## 2021-12-08 ENCOUNTER — Ambulatory Visit: Payer: PPO | Attending: Internal Medicine

## 2021-12-08 DIAGNOSIS — I4891 Unspecified atrial fibrillation: Secondary | ICD-10-CM

## 2021-12-08 DIAGNOSIS — Z5181 Encounter for therapeutic drug level monitoring: Secondary | ICD-10-CM | POA: Diagnosis not present

## 2021-12-08 LAB — POCT INR: INR: 2.7 (ref 2.0–3.0)

## 2021-12-08 NOTE — Patient Instructions (Signed)
Description   Continue taking Warfarin 1.5 tablets (7.'5mg'$ ) daily. Call us with any new medications scheduled for any procedures or any questions. Recheck in 8 weeks. Phone [phone] Coumadin Clinic Main # [phone] fax [phone]

## 2021-12-14 ENCOUNTER — Emergency Department (HOSPITAL_COMMUNITY): Payer: PPO

## 2021-12-14 ENCOUNTER — Inpatient Hospital Stay (HOSPITAL_COMMUNITY)
Admission: EM | Admit: 2021-12-14 | Discharge: 2021-12-22 | DRG: 287 | Disposition: A | Discharge status: Skilled Nursing Facility | Payer: PPO | Attending: Internal Medicine | Admitting: Internal Medicine

## 2021-12-14 ENCOUNTER — Other Ambulatory Visit: Payer: Self-pay

## 2021-12-14 ENCOUNTER — Telehealth: Payer: Self-pay | Admitting: Pharmacist

## 2021-12-14 ENCOUNTER — Encounter (HOSPITAL_COMMUNITY): Payer: Self-pay

## 2021-12-14 DIAGNOSIS — R1311 Dysphagia, oral phase: Secondary | ICD-10-CM | POA: Diagnosis not present

## 2021-12-14 DIAGNOSIS — I2582 Chronic total occlusion of coronary artery: Secondary | ICD-10-CM | POA: Diagnosis present

## 2021-12-14 DIAGNOSIS — Z888 Allergy status to other drugs, medicaments and biological substances status: Secondary | ICD-10-CM

## 2021-12-14 DIAGNOSIS — R079 Chest pain, unspecified: Secondary | ICD-10-CM | POA: Diagnosis not present

## 2021-12-14 DIAGNOSIS — Z1152 Encounter for screening for COVID-19: Secondary | ICD-10-CM | POA: Diagnosis not present

## 2021-12-14 DIAGNOSIS — Z8249 Family history of ischemic heart disease and other diseases of the circulatory system: Secondary | ICD-10-CM

## 2021-12-14 DIAGNOSIS — E538 Deficiency of other specified B group vitamins: Secondary | ICD-10-CM | POA: Diagnosis present

## 2021-12-14 DIAGNOSIS — I1A0 Resistant hypertension: Secondary | ICD-10-CM | POA: Diagnosis present

## 2021-12-14 DIAGNOSIS — Z23 Encounter for immunization: Secondary | ICD-10-CM

## 2021-12-14 DIAGNOSIS — I350 Nonrheumatic aortic (valve) stenosis: Secondary | ICD-10-CM | POA: Diagnosis present

## 2021-12-14 DIAGNOSIS — R32 Unspecified urinary incontinence: Secondary | ICD-10-CM | POA: Diagnosis present

## 2021-12-14 DIAGNOSIS — G4733 Obstructive sleep apnea (adult) (pediatric): Secondary | ICD-10-CM | POA: Diagnosis present

## 2021-12-14 DIAGNOSIS — R2681 Unsteadiness on feet: Secondary | ICD-10-CM | POA: Diagnosis not present

## 2021-12-14 DIAGNOSIS — L03311 Cellulitis of abdominal wall: Secondary | ICD-10-CM

## 2021-12-14 DIAGNOSIS — E785 Hyperlipidemia, unspecified: Secondary | ICD-10-CM | POA: Diagnosis present

## 2021-12-14 DIAGNOSIS — I77810 Thoracic aortic ectasia: Secondary | ICD-10-CM | POA: Diagnosis present

## 2021-12-14 DIAGNOSIS — Z6837 Body mass index (BMI) 37.0-37.9, adult: Secondary | ICD-10-CM

## 2021-12-14 DIAGNOSIS — J9811 Atelectasis: Secondary | ICD-10-CM | POA: Diagnosis not present

## 2021-12-14 DIAGNOSIS — I4821 Permanent atrial fibrillation: Secondary | ICD-10-CM | POA: Diagnosis present

## 2021-12-14 DIAGNOSIS — D7389 Other diseases of spleen: Secondary | ICD-10-CM | POA: Diagnosis not present

## 2021-12-14 DIAGNOSIS — R7989 Other specified abnormal findings of blood chemistry: Secondary | ICD-10-CM | POA: Diagnosis present

## 2021-12-14 DIAGNOSIS — I2510 Atherosclerotic heart disease of native coronary artery without angina pectoris: Principal | ICD-10-CM | POA: Diagnosis present

## 2021-12-14 DIAGNOSIS — R531 Weakness: Principal | ICD-10-CM

## 2021-12-14 DIAGNOSIS — L24A2 Irritant contact dermatitis due to fecal, urinary or dual incontinence: Secondary | ICD-10-CM | POA: Diagnosis present

## 2021-12-14 DIAGNOSIS — I952 Hypotension due to drugs: Secondary | ICD-10-CM | POA: Diagnosis not present

## 2021-12-14 DIAGNOSIS — I959 Hypotension, unspecified: Secondary | ICD-10-CM | POA: Diagnosis not present

## 2021-12-14 DIAGNOSIS — R41841 Cognitive communication deficit: Secondary | ICD-10-CM | POA: Diagnosis not present

## 2021-12-14 DIAGNOSIS — Z880 Allergy status to penicillin: Secondary | ICD-10-CM

## 2021-12-14 DIAGNOSIS — I7789 Other specified disorders of arteries and arterioles: Secondary | ICD-10-CM | POA: Diagnosis not present

## 2021-12-14 DIAGNOSIS — I10 Essential (primary) hypertension: Secondary | ICD-10-CM | POA: Diagnosis not present

## 2021-12-14 DIAGNOSIS — E669 Obesity, unspecified: Secondary | ICD-10-CM | POA: Diagnosis present

## 2021-12-14 DIAGNOSIS — I499 Cardiac arrhythmia, unspecified: Secondary | ICD-10-CM | POA: Diagnosis not present

## 2021-12-14 DIAGNOSIS — M1990 Unspecified osteoarthritis, unspecified site: Secondary | ICD-10-CM | POA: Diagnosis present

## 2021-12-14 DIAGNOSIS — L304 Erythema intertrigo: Secondary | ICD-10-CM | POA: Diagnosis present

## 2021-12-14 DIAGNOSIS — I3139 Other pericardial effusion (noninflammatory): Secondary | ICD-10-CM | POA: Diagnosis present

## 2021-12-14 DIAGNOSIS — I2721 Secondary pulmonary arterial hypertension: Secondary | ICD-10-CM | POA: Diagnosis present

## 2021-12-14 DIAGNOSIS — N200 Calculus of kidney: Secondary | ICD-10-CM | POA: Diagnosis not present

## 2021-12-14 DIAGNOSIS — K5909 Other constipation: Secondary | ICD-10-CM | POA: Diagnosis present

## 2021-12-14 DIAGNOSIS — I214 Non-ST elevation (NSTEMI) myocardial infarction: Secondary | ICD-10-CM

## 2021-12-14 DIAGNOSIS — I340 Nonrheumatic mitral (valve) insufficiency: Secondary | ICD-10-CM | POA: Diagnosis present

## 2021-12-14 DIAGNOSIS — M6281 Muscle weakness (generalized): Secondary | ICD-10-CM | POA: Diagnosis not present

## 2021-12-14 DIAGNOSIS — D649 Anemia, unspecified: Secondary | ICD-10-CM | POA: Diagnosis present

## 2021-12-14 DIAGNOSIS — R001 Bradycardia, unspecified: Secondary | ICD-10-CM | POA: Diagnosis not present

## 2021-12-14 DIAGNOSIS — Z7901 Long term (current) use of anticoagulants: Secondary | ICD-10-CM

## 2021-12-14 DIAGNOSIS — I700 Atherosclerosis of aorta: Secondary | ICD-10-CM | POA: Diagnosis not present

## 2021-12-14 DIAGNOSIS — Z8673 Personal history of transient ischemic attack (TIA), and cerebral infarction without residual deficits: Secondary | ICD-10-CM

## 2021-12-14 DIAGNOSIS — Z79899 Other long term (current) drug therapy: Secondary | ICD-10-CM

## 2021-12-14 DIAGNOSIS — R778 Other specified abnormalities of plasma proteins: Secondary | ICD-10-CM | POA: Diagnosis not present

## 2021-12-14 LAB — CBC WITH DIFFERENTIAL/PLATELET
Abs Immature Granulocytes: 0.11 10*3/uL — ABNORMAL HIGH (ref 0.00–0.07)
Basophils Absolute: 0 10*3/uL (ref 0.0–0.1)
Basophils Relative: 0 %
Eosinophils Absolute: 0 10*3/uL (ref 0.0–0.5)
Eosinophils Relative: 0 %
HCT: 38.3 % — ABNORMAL LOW (ref 39.0–52.0)
Hemoglobin: 12.8 g/dL — ABNORMAL LOW (ref 13.0–17.0)
Immature Granulocytes: 1 %
Lymphocytes Relative: 3 %
Lymphs Abs: 0.5 10*3/uL — ABNORMAL LOW (ref 0.7–4.0)
MCH: 30.8 pg (ref 26.0–34.0)
MCHC: 33.4 g/dL (ref 30.0–36.0)
MCV: 92.1 fL (ref 80.0–100.0)
Monocytes Absolute: 0.7 10*3/uL (ref 0.1–1.0)
Monocytes Relative: 4 %
Neutro Abs: 14.6 10*3/uL — ABNORMAL HIGH (ref 1.7–7.7)
Neutrophils Relative %: 92 %
Platelets: 186 10*3/uL (ref 150–400)
RBC: 4.16 MIL/uL — ABNORMAL LOW (ref 4.22–5.81)
RDW: 14.4 % (ref 11.5–15.5)
WBC: 15.9 10*3/uL — ABNORMAL HIGH (ref 4.0–10.5)
nRBC: 0 % (ref 0.0–0.2)

## 2021-12-14 LAB — COMPREHENSIVE METABOLIC PANEL
ALT: 20 U/L (ref 0–44)
AST: 22 U/L (ref 15–41)
Albumin: 3.4 g/dL — ABNORMAL LOW (ref 3.5–5.0)
Alkaline Phosphatase: 82 U/L (ref 38–126)
Anion gap: 9 (ref 5–15)
BUN: 20 mg/dL (ref 8–23)
CO2: 25 mmol/L (ref 22–32)
Calcium: 10.6 mg/dL — ABNORMAL HIGH (ref 8.9–10.3)
Chloride: 103 mmol/L (ref 98–111)
Creatinine, Ser: 0.86 mg/dL (ref 0.61–1.24)
GFR, Estimated: >60 mL/min (ref >60)
Glucose, Bld: 147 mg/dL — ABNORMAL HIGH (ref 70–99)
Potassium: 4 mmol/L (ref 3.5–5.1)
Sodium: 137 mmol/L (ref 135–145)
Total Bilirubin: 0.9 mg/dL (ref 0.3–1.2)
Total Protein: 6.5 g/dL (ref 6.5–8.1)

## 2021-12-14 LAB — URINALYSIS, ROUTINE W REFLEX MICROSCOPIC
Bilirubin Urine: NEGATIVE
Glucose, UA: NEGATIVE mg/dL
Ketones, ur: NEGATIVE mg/dL
Leukocytes,Ua: NEGATIVE
Nitrite: NEGATIVE
Protein, ur: NEGATIVE mg/dL
RBC / HPF: >50 RBC/hpf — ABNORMAL HIGH (ref 0–5)
Specific Gravity, Urine: 1.015 (ref 1.005–1.030)
pH: 7 (ref 5.0–8.0)

## 2021-12-14 LAB — APTT: aPTT: 43 seconds — ABNORMAL HIGH (ref 24–36)

## 2021-12-14 LAB — TROPONIN I (HIGH SENSITIVITY): Troponin I (High Sensitivity): 21 ng/L — ABNORMAL HIGH (ref <18)

## 2021-12-14 NOTE — ED Provider Notes (Signed)
[facility] EMERGENCY DEPARTMENT Provider Note   CSN: [id number] Arrival date & time: 12/14/21  2125     History Afib, HTN, Mitral valve disorder, OSA Chief Complaint  Patient presents with   Weakness    [name] is a 71 y.o. male.  71 year old male with a past medical history of A-fib currently anticoagulated on Coumadin, hypertension, rheumatic aortic valve stenosis presents to the ED via EMS with a chief complaint of generalized weakness which began this afternoon.  Patient is able to usually ambulate with his walker about 30 feet, this afternoon he reports he was unable to ambulate at all with his walker as he felt overall weak, "like I did not have the strength to actually take a couple of steps ".  He does report feeling somewhat indigestion, complaining some some epigastric discomfort, however does not refer to it as chest pain.  His last bowel movement was approximately 2 days ago, he reports he usually has trouble with constipation, he is unsure whether he is passing gas or not.  He has not had any nausea, no vomiting.  He does have a history of A-fib, anticoagulated on Coumadin has been for several years.  Family member at the bedside this reports he is feeling a little more tachypneic, he does get very winded when he is trying to do any activity like standing up or walk.  Patient denies any sick contacts, he is mostly confined to his home and has not been around anybody has been sick.  No fever, no headache, no trauma, no changes in the medication, no urinary symptoms.  The history is provided by the patient and a relative.  Weakness Severity:  Severe Associated symptoms: abdominal pain and nausea   Associated symptoms: no fever and no vomiting        Home Medications Prior to Admission medications   Medication Sig Start Date End Date Taking? Authorizing Provider  acetaminophen (TYLENOL) 325 MG tablet Take 650 mg by mouth daily as needed (pain).   Yes  [provider]  amLODipine (NORVASC) 10 MG tablet Take 1 tablet (10 mg total) by mouth daily. 07/15/21  Yes [name], MD  carvedilol (COREG) 12.5 MG tablet TAKE 1 AND 1/2 TABLET BY MOUTH TWO TIMES A DAY Patient taking differently: Take 18.75 mg by mouth 2 (two) times daily with a meal. 01/26/21  Yes Fay Records, MD  cloNIDine (CATAPRES) 0.2 MG tablet Take 1 tablet (0.2 mg total) by mouth 2 (two) times daily. 07/15/21  Yes [name], MD  doxazosin (CARDURA) 4 MG tablet Take 1 tablet (4 mg total) by mouth daily. 07/15/21  Yes [name], MD  ezetimibe (ZETIA) 10 MG tablet Take 1 tablet (10 mg total) by mouth daily. 07/15/21  Yes [name], MD  hydrochlorothiazide (MICROZIDE) 12.5 MG capsule Take 1 capsule (12.5 mg total) by mouth daily. Patient taking differently: Take 12.5 mg by mouth every evening. 07/15/21  Yes [name], MD  losartan (COZAAR) 100 MG tablet Take 1 tablet (100 mg total) by mouth daily. 07/15/21  Yes [name], MD  lubiprostone (AMITIZA) 24 MCG capsule Take 1 capsule (24 mcg total) by mouth 2 (two) times daily. 07/15/21  Yes [name], MD  meloxicam (MOBIC) 15 MG tablet Take 1 tablet (15 mg total) by mouth daily. 07/15/21  Yes [name], MD  OMEGA-3 FATTY ACIDS PO Take 1 capsule by mouth in the morning and at  bedtime.   Yes [provider]  warfarin (COUMADIN) 5 MG tablet TAKE 1 TO 1 AND 1/2 TABLETS BY MOUTH DAILY AS DIRECTED BY COUMADIN CLINIC Patient taking differently: Take 7.5 mg by mouth every evening. 08/05/21  Yes Fay Records, MD  Iron, Ferrous Sulfate, 325 (65 Fe) MG TABS Take 325 mg by mouth daily. Patient not taking: Reported on 12/15/2021 07/16/21   [name], MD  ketoconazole (NIZORAL) 2 % cream Apply 1 application topically 2 (two) times daily as needed for irritation. Patient not taking: Reported on 12/15/2021 06/29/20   [name], MD      Allergies    Statins and Penicillins    Review of Systems    Review of Systems  Constitutional:  Negative for chills and fever.  HENT:  Negative for sore throat.   Gastrointestinal:  Positive for abdominal pain and nausea. Negative for vomiting.  Genitourinary:  Negative for flank pain.  Musculoskeletal:  Negative for back pain.  Skin:  Negative for pallor and wound.  Neurological:  Positive for weakness.  All other systems reviewed and are negative.   Physical Exam Updated Vital Signs BP (!) 144/91 (BP Location: Left Arm)   Pulse 88   Temp 98.7 F (37.1 C) (Oral)   Resp 20   Ht '5\' 10"'$  (1.778 m)   Wt 115.9 kg   SpO2 94%   BMI 36.66 kg/m  Physical Exam Vitals and nursing note reviewed.  Constitutional:      Appearance: Normal appearance. He is ill-appearing.  HENT:     Head: Normocephalic and atraumatic.     Nose: Nose normal.     Mouth/Throat:     Mouth: Mucous membranes are dry.  Eyes:     Pupils: Pupils are equal, round, and reactive to light.  Cardiovascular:     Rate and Rhythm: Rhythm irregular.  Pulmonary:     Effort: Pulmonary effort is normal.     Comments: Lungs are difficult to auscultate due to body habitus.  Abdominal:     General: Abdomen is flat.     Palpations: Abdomen is soft.     Tenderness: There is abdominal tenderness. There is no right CVA tenderness or left CVA tenderness.  Musculoskeletal:     Cervical back: Normal range of motion and neck supple.  Skin:    General: Skin is warm and dry.  Neurological:     Mental Status: He is alert and oriented to person, place, and time.     ED Results / Procedures / Treatments   Labs (all labs ordered are listed, but only abnormal results are displayed) Labs Reviewed  CBC WITH DIFFERENTIAL/PLATELET - Abnormal; Notable for the following components:      Result Value   WBC 15.9 (*)    RBC 4.16 (*)    Hemoglobin 12.8 (*)    HCT 38.3 (*)    Neutro Abs 14.6 (*)    Lymphs Abs 0.5 (*)    Abs Immature Granulocytes 0.11 (*)    All other components within  normal limits  COMPREHENSIVE METABOLIC PANEL - Abnormal; Notable for the following components:   Glucose, Bld 147 (*)    Calcium 10.6 (*)    Albumin 3.4 (*)    All other components within normal limits  URINALYSIS, ROUTINE W REFLEX MICROSCOPIC - Abnormal; Notable for the following components:   Hgb urine dipstick MODERATE (*)    RBC / HPF >50 (*)    Bacteria, UA RARE (*)  All other components within normal limits  APTT - Abnormal; Notable for the following components:   aPTT 43 (*)    All other components within normal limits  CBC - Abnormal; Notable for the following components:   WBC 13.8 (*)    RBC 4.02 (*)    Hemoglobin 12.4 (*)    HCT 36.8 (*)    All other components within normal limits  BASIC METABOLIC PANEL - Abnormal; Notable for the following components:   Sodium 134 (*)    Glucose, Bld 126 (*)    Calcium 10.4 (*)    All other components within normal limits  CK - Abnormal; Notable for the following components:   Total CK 44 (*)    All other components within normal limits  VITAMIN B12 - Abnormal; Notable for the following components:   Vitamin B-12 74 (*)    All other components within normal limits  PROTIME-INR - Abnormal; Notable for the following components:   Prothrombin Time 32.6 (*)    INR 3.2 (*)    All other components within normal limits  FOLATE - Abnormal; Notable for the following components:   Folate 5.9 (*)    All other components within normal limits  CBC - Abnormal; Notable for the following components:   WBC 11.3 (*)    RBC 3.76 (*)    Hemoglobin 11.9 (*)    HCT 33.6 (*)    All other components within normal limits  BASIC METABOLIC PANEL - Abnormal; Notable for the following components:   Glucose, Bld 109 (*)    Calcium 10.6 (*)    All other components within normal limits  PROTIME-INR - Abnormal; Notable for the following components:   Prothrombin Time 34.1 (*)    INR 3.4 (*)    All other components within normal limits  PROTIME-INR -  Abnormal; Notable for the following components:   Prothrombin Time 20.7 (*)    INR 1.8 (*)    All other components within normal limits  PROTIME-INR - Abnormal; Notable for the following components:   Prothrombin Time 17.8 (*)    INR 1.5 (*)    All other components within normal limits  CBC - Abnormal; Notable for the following components:   RBC 4.01 (*)    Hemoglobin 12.3 (*)    HCT 36.7 (*)    All other components within normal limits  CBC - Abnormal; Notable for the following components:   RBC 4.01 (*)    Hemoglobin 12.4 (*)    HCT 36.5 (*)    All other components within normal limits  LIPOPROTEIN A (LPA) - Abnormal; Notable for the following components:   Lipoprotein (a) 123.1 (*)    All other components within normal limits  POCT I-STAT 7, (LYTES, BLD GAS, ICA,H+H) - Abnormal; Notable for the following components:   pO2, Arterial 80 (*)    Calcium, Ion 1.45 (*)    HCT 36.0 (*)    Hemoglobin 12.2 (*)    All other components within normal limits  POCT I-STAT EG7 - Abnormal; Notable for the following components:   pCO2, Ven 40.6 (*)    Calcium, Ion 1.45 (*)    HCT 36.0 (*)    Hemoglobin 12.2 (*)    All other components within normal limits  POCT I-STAT EG7 - Abnormal; Notable for the following components:   pCO2, Ven 41.6 (*)    Calcium, Ion 1.47 (*)    HCT 35.0 (*)    Hemoglobin 11.9 (*)  All other components within normal limits  TROPONIN I (HIGH SENSITIVITY) - Abnormal; Notable for the following components:   Troponin I (High Sensitivity) 21 (*)    All other components within normal limits  TROPONIN I (HIGH SENSITIVITY) - Abnormal; Notable for the following components:   Troponin I (High Sensitivity) 51 (*)    All other components within normal limits  TROPONIN I (HIGH SENSITIVITY) - Abnormal; Notable for the following components:   Troponin I (High Sensitivity) 196 (*)    All other components within normal limits  TROPONIN I (HIGH SENSITIVITY) - Abnormal;  Notable for the following components:   Troponin I (High Sensitivity) 334 (*)    All other components within normal limits  TROPONIN I (HIGH SENSITIVITY) - Abnormal; Notable for the following components:   Troponin I (High Sensitivity) 204 (*)    All other components within normal limits  SARS CORONAVIRUS 2 BY RT PCR  URINE CULTURE  RESPIRATORY PANEL BY PCR  TSH  PROTIME-INR  PROTIME-INR  HEPARIN ANTI-XA  PROTIME-INR  CBC  BASIC METABOLIC PANEL    EKG EKG Interpretation  Date/Time:  Tuesday December 14 2021 21:36:41 EDT Ventricular Rate:  80 PR Interval:    QRS Duration: 132 QT Interval:  364 QTC Calculation: 419 R Axis:   -61 Text Interpretation: Atrial fibrillation Left axis deviation Non-specific intra-ventricular conduction block Inferior infarct , age undetermined Anterolateral infarct , age undetermined Abnormal ECG When compared with ECG of 03-Apr-2009 12:32,  atrial fibrillation is new since last ecg in 2011 Confirmed by Dorie Rank [phone]) on 12/14/2021 9:38:58 PM  Radiology No results found.  Procedures Procedures    Medications Ordered in ED Medications  amLODipine (NORVASC) tablet 10 mg (10 mg Oral Given 12/19/21 1022)  cloNIDine (CATAPRES) tablet 0.2 mg (0.2 mg Oral Given 12/19/21 2137)  doxazosin (CARDURA) tablet 4 mg (4 mg Oral Given 12/19/21 1022)  losartan (COZAAR) tablet 100 mg (100 mg Oral Given 12/19/21 1022)  lubiprostone (AMITIZA) capsule 24 mcg (24 mcg Oral Given 12/19/21 1751)  sodium chloride flush (NS) 0.9 % injection 3 mL (3 mLs Intravenous Given 12/19/21 2138)  senna-docusate (Senokot-S) tablet 1 tablet (1 tablet Oral Given 12/18/21 1559)  hydrochlorothiazide (HYDRODIURIL) tablet 12.5 mg (12.5 mg Oral Given 12/19/21 1022)  perflutren lipid microspheres (DEFINITY) IV suspension (2 mLs Intravenous Given 12/15/21 0953)  doxycycline (VIBRA-TABS) tablet 100 mg (100 mg Oral Given 12/19/21 2136)  liver oil-zinc oxide (DESITIN) 40 % ointment (  Topical MAR Unhold 12/17/21 1752)  cyanocobalamin (VITAMIN B12) injection 1,000 mcg (1,000 mcg Intramuscular Given 12/19/21 1750)  aspirin EC tablet 81 mg (81 mg Oral Given 33/54/56 2563)  folic acid (FOLVITE) tablet 1 mg (1 mg Oral Given 12/19/21 1021)  labetalol (NORMODYNE) injection 10 mg (has no administration in time range)  hydrALAZINE (APRESOLINE) injection 10 mg (has no administration in time range)  acetaminophen (TYLENOL) tablet 650 mg (has no administration in time range)  ondansetron (ZOFRAN) injection 4 mg (has no administration in time range)  0.9 %  sodium chloride infusion (0 mLs Intravenous Stopped 12/18/21 0941)  sodium chloride flush (NS) 0.9 % injection 3 mL (3 mLs Intravenous Given 12/19/21 2139)  sodium chloride flush (NS) 0.9 % injection 3 mL (has no administration in time range)  0.9 %  sodium chloride infusion (has no administration in time range)  Warfarin - Pharmacist Dosing Inpatient ( Does not apply Duplicate 89/37/34 2876)  influenza vaccine adjuvanted (FLUAD) injection 0.5 mL (has no administration in time range)  Oral care mouth rinse (has no administration in time range)  polyethylene glycol (MIRALAX / GLYCOLAX) packet 17 g (17 g Oral Not Given 12/19/21 2137)  ezetimibe (ZETIA) tablet 10 mg (10 mg Oral Given 12/19/21 1022)  carvedilol (COREG) tablet 25 mg (25 mg Oral Given 12/19/21 1751)  enoxaparin (LOVENOX) injection 100 mg (100 mg Subcutaneous Given 12/19/21 2143)  iohexol (OMNIPAQUE) 350 MG/ML injection 75 mL (75 mLs Intravenous Contrast Given 12/15/21 0010)  doxycycline (VIBRA-TABS) tablet 100 mg (100 mg Oral Given 12/15/21 0334)  phytonadione (VITAMIN K) tablet 2.5 mg (2.5 mg Oral Given 12/15/21 2212)  aspirin chewable tablet 324 mg (324 mg Oral Given 12/15/21 2213)  phytonadione (VITAMIN K) 5 mg in dextrose 5 % 50 mL IVPB (0 mg Intravenous Stopped 12/16/21 1504)  enoxaparin (LOVENOX) injection 120 mg (120 mg Subcutaneous Given 12/16/21 2253)  aspirin  chewable tablet 81 mg (81 mg Oral Given 12/17/21 0816)  warfarin (COUMADIN) tablet 7.5 mg (7.5 mg Oral Given 12/17/21 2031)  furosemide (LASIX) injection 60 mg (60 mg Intravenous Given 12/18/21 0950)  warfarin (COUMADIN) tablet 7.5 mg (7.5 mg Oral Given 12/19/21 1751)    ED Course/ Medical Decision Making/ A&P Clinical Course as of 12/19/21 2345  Tue Dec 14, 2021  2331 WBC, UA: 0-5 [WF]    Clinical Course User Index [WF] Marcello Fennel, PA-C                           Medical Decision Making Amount and/or Complexity of Data Reviewed Labs: ordered. Decision-making details documented in ED Course. Radiology: ordered.  Risk Prescription drug management. Decision regarding hospitalization.   This patient presents to the ED for concern of weakness, this involves a number of treatment options, and is a complaint that carries with it a high risk of complications and morbidity.  The differential diagnosis includes pneumonia, versus UTI versus ACS.    Co morbidities: Discussed in HPI   Brief History:  Patient with underlying history of A-fib currently anticoagulated on Coumadin for several years here with generalized weakness, unable to take a few steps with his walker today.  He does live at home, takes care of himself on his own.  Denies any sick contacts, no fevers.  Is endorsing some indigestion, reporting his last bowel movement was 2 days ago which was normal.  Patient does not usually have bowel movements daily.  He did not take anything for this indigestion.  He is denying any chest pain, does feel more winded with any kind of activity.  Arrived in the ED with no hypoxia or tachycardia and afebrile.  EMR reviewed including pt PMHx, past surgical history and past visits to ER.   See HPI for more details   Lab Tests:  I ordered and independently interpreted labs.  The pertinent results include:    Labs notable for evaded white count of 15.9, significant elevated from  prior labs noted.  CMP no electrolyte derangement, creatinine levels within normal limits.  LFTs unremarkable.  UA with bacteria, 0-5 white blood cell count.  COVID-19 test is currently pending.  PTT slightly elevated.   Imaging Studies:  X-ray did not show any acute findings. CT Chest/abd/pelvis pending   Cardiac Monitoring:  The patient was maintained on a cardiac monitor.  I personally viewed and interpreted the cardiac monitored which showed an underlying rhythm of: irregularly irregular  EKG non-ischemic   Patient was signed out to my colleague, incoming team pending laboratory  results.  Patient appeared to be hemodynamically stable at the time of my disposition.     Portions of this note were generated with [employer]. Dictation errors may occur despite best attempts at proofreading. ] Final Clinical Impression(s) / ED Diagnoses Final diagnoses:  Generalized weakness  Cellulitis of abdominal wall    Rx / DC Orders ED Discharge Orders     None         Janeece Fitting, PA-C 12/19/21 2345    Dorie Rank, MD 12/20/21 [phone]

## 2021-12-14 NOTE — Chronic Care Management (AMB) (Signed)
Chronic Care Management Pharmacy Assistant   Name: [name]  MRN: [id number] DOB: [birth year]  Reason for Encounter: Disease State / Hypertension and Hyperlipidemia Assessment Call   Conditions to be addressed/monitored: HTN and HLD  Recent office visits:  07/15/2021 Alysia Penna MD - Patient was seen for preventative health care and additional concerns. No medication changes. No follow up noted.   Recent consult visits:  10/27/2021 [name] MD (cardiology) - Patient was seen for Nonrheumatic aortic valve stenosis. No medication changes. Follow up in 4 months.   08/18/2021 Zorita Pang (cardiology) - Patient was seen for Unspecified atrial fibrillation and an additional concern. No additional chart notes.   08/17/2021 Kalman Shan DO (Wound Care) - Patient was seen for wound assistance.   08/06/2021 Kalman Shan DO (Wound Care) - Patient was seen for wound assistance.  07/29/2021 Kalman Shan DO (Wound Care) - Patient was seen for wound assistance.  07/22/2021 Kalman Shan DO (Wound Care) - Patient was seen for wound assistance.  Hospital visits:  None  Medications: Outpatient Encounter Medications as of 12/14/2021  Medication Sig   amLODipine (NORVASC) 10 MG tablet Take 1 tablet (10 mg total) by mouth daily.   carvedilol (COREG) 12.5 MG tablet TAKE 1 AND 1/2 TABLET BY MOUTH TWO TIMES A DAY   cloNIDine (CATAPRES) 0.2 MG tablet Take 1 tablet (0.2 mg total) by mouth 2 (two) times daily.   doxazosin (CARDURA) 4 MG tablet Take 1 tablet (4 mg total) by mouth daily.   ezetimibe (ZETIA) 10 MG tablet Take 1 tablet (10 mg total) by mouth daily.   hydrochlorothiazide (MICROZIDE) 12.5 MG capsule Take 1 capsule (12.5 mg total) by mouth daily.   Iron, Ferrous Sulfate, 325 (65 Fe) MG TABS Take 325 mg by mouth daily.   ketoconazole (NIZORAL) 2 % cream Apply 1 application topically 2 (two) times daily as needed for irritation.   losartan (COZAAR) 100 MG tablet Take 1 tablet (100  mg total) by mouth daily.   lubiprostone (AMITIZA) 24 MCG capsule Take 1 capsule (24 mcg total) by mouth 2 (two) times daily.   meloxicam (MOBIC) 15 MG tablet Take 1 tablet (15 mg total) by mouth daily.   OMEGA-3 FATTY ACIDS PO Take 1,200 mg by mouth in the morning and at bedtime.   warfarin (COUMADIN) 5 MG tablet TAKE 1 TO 1 AND 1/2 TABLETS BY MOUTH DAILY AS DIRECTED BY COUMADIN CLINIC   No facility-administered encounter medications on file as of 12/14/2021.  Fill History:  AMLODIPINE 10 MG TABLET 09/20/2021 90 90    carvedilol (COREG) tablet 10/18/2021 90   CLONIDINE HCL 0.2 MG TABLET 09/20/2021 90   DOXAZOSIN 4 MG TABLET 09/20/2021 90   EZETIMIBE 10 MG TABLET 09/20/2021 90   HYDROCHLOROTHIAZIDE 12.5 MG CAPSULE 09/20/2021 90   LOSARTAN 100 MG TABLET 09/20/2021 90   lubiprostone (AMITIZA) capsule 10/11/2021 90   meloxicam (MOBIC) tablet 10/18/2021 90   warfarin (COUMADIN) tablet 10/23/2021 33   Reviewed chart prior to disease state call. Spoke with patient regarding BP  Recent Office Vitals: BP Readings from Last 3 Encounters:  10/27/21 138/82  07/15/21 118/78  01/15/21 122/60   Pulse Readings from Last 3 Encounters:  10/27/21 63  07/15/21 96  01/15/21 60    Wt Readings from Last 3 Encounters:  10/27/21 263 lb (119.3 kg)  07/15/21 271 lb (122.9 kg)  03/18/21 267 lb (121.1 kg)     Kidney Function Lab Results  Component Value Date/Time  CREATININE 0.93 07/15/2021 09:08 AM   CREATININE 1.00 06/29/2020 09:59 AM   GFR 82.61 07/15/2021 09:08 AM   GFRNONAA 63 04/17/2019 12:00 AM   GFRAA 72 04/17/2019 12:00 AM       Latest Ref Rng & Units 07/15/2021    9:08 AM 07/10/2020    9:25 AM 06/29/2020    9:59 AM  BMP  Glucose 70 - 99 mg/dL 101   106   BUN 6 - 23 mg/dL 23   28   Creatinine 0.40 - 1.50 mg/dL 0.93   1.00   Sodium 135 - 145 mEq/L 135   137   Potassium 3.5 - 5.1 mEq/L 4.4   4.5   Chloride 96 - 112 mEq/L 104   104   CO2 19 - 32 mEq/L 28   28   Calcium  8.4 - 10.5 mg/dL 10.9  11.0  11.7    Comprehensive medication review performed; Spoke to patient regarding cholesterol  Lipid Panel    Component Value Date/Time   CHOL 147 07/15/2021 0908   CHOL 163 03/29/2019 1655   TRIG 103.0 07/15/2021 0908   HDL 34.10 (L) 07/15/2021 0908   HDL 40 03/29/2019 1655   LDLCALC 92 07/15/2021 0908   LDLCALC 103 (H) 03/29/2019 1655   LDLDIRECT 168.0 03/25/2014 0844    10-year ASCVD risk score: The 10-year ASCVD risk score (Arnett DK, et al., 2019) is: 25.4%   Values used to calculate the score:     Age: 71 years     Sex: Male     Is Non-Hispanic African American: No     Diabetic: No     Tobacco smoker: No     Systolic Blood Pressure: 497 mmHg     Is BP treated: Yes     HDL Cholesterol: 34.1 mg/dL     Total Cholesterol: 147 mg/dL  Current antihyperlipidemic regimen:  Zetia 10 mg daily  Previous antihyperlipidemic medications tried:   ASCVD risk enhancing conditions: age >4 and HTN  What recent interventions/DTPs have been made by any provider to improve Cholesterol control since last CPP Visit: No recent interventions  Current antihypertensive regimen:  Amlodipine 10 mg daily Carvedilol 12.5 mg 1.5 tablets twice daily Doxazosin 4 mg daily HCTZ 12.5 mg daily Losartan 100 mg daily  How often are you checking your Blood Pressure?    Current home BP readings:   What recent interventions/DTPs have been made by any provider to improve Blood Pressure control since last CPP Visit: No recent interventions  Any recent hospitalizations or ED visits since last visit with CPP? No recent hospital visits.   What diet changes have been made to improve Blood Pressure and Cholesterol Control?  Patient follows Breakfast  - patient Lunch - patient Dinner - patient  What exercise is being done to improve your Blood Pressure and Cholesterol Control?    Adherence Review: Is the patient currently on ACE/ARB medication? Yes Does the patient have >5  day gap between last estimated fill dates? No  Unable to reach patient after several attempts.   Care Gaps: AWV - scheduled 03/21/2022 Last BP - 138/82 on 10/27/2021 Shingrix - never done Covid  - overdue Flu - due Colonoscopy - postponed Tdap - postponed Hep C Screen - postponed  Star Rating Drugs: Losartan 100 mg - last filled 09/20/2021 90 DS at [place] Pharmacist Assistant [phone]

## 2021-12-14 NOTE — ED Triage Notes (Signed)
Pt here via [facility] from home for progressive weakness that started today. Pt states at baseline he can get up to his chair and uses walker at baseline and he has been unable to get up by himself today. Pt states he has had no appetite today, pt hot to touch, increased urination. Pt has hx of afib, currently in afib at controlled rate. Pt denies cp/sob. 150/80, 80HR, cbg 160, 16RR, 20g L hand

## 2021-12-15 ENCOUNTER — Encounter (HOSPITAL_COMMUNITY): Payer: Self-pay | Admitting: Family Medicine

## 2021-12-15 ENCOUNTER — Observation Stay (HOSPITAL_BASED_OUTPATIENT_CLINIC_OR_DEPARTMENT_OTHER): Payer: PPO

## 2021-12-15 DIAGNOSIS — E785 Hyperlipidemia, unspecified: Secondary | ICD-10-CM | POA: Diagnosis not present

## 2021-12-15 DIAGNOSIS — R7989 Other specified abnormal findings of blood chemistry: Secondary | ICD-10-CM | POA: Diagnosis not present

## 2021-12-15 DIAGNOSIS — I350 Nonrheumatic aortic (valve) stenosis: Secondary | ICD-10-CM

## 2021-12-15 DIAGNOSIS — I10 Essential (primary) hypertension: Secondary | ICD-10-CM | POA: Diagnosis not present

## 2021-12-15 DIAGNOSIS — I4821 Permanent atrial fibrillation: Secondary | ICD-10-CM

## 2021-12-15 DIAGNOSIS — R778 Other specified abnormalities of plasma proteins: Secondary | ICD-10-CM

## 2021-12-15 DIAGNOSIS — I1A0 Resistant hypertension: Secondary | ICD-10-CM | POA: Diagnosis not present

## 2021-12-15 DIAGNOSIS — R079 Chest pain, unspecified: Secondary | ICD-10-CM | POA: Diagnosis not present

## 2021-12-15 DIAGNOSIS — R531 Weakness: Secondary | ICD-10-CM

## 2021-12-15 DIAGNOSIS — L03311 Cellulitis of abdominal wall: Secondary | ICD-10-CM | POA: Diagnosis not present

## 2021-12-15 LAB — SARS CORONAVIRUS 2 BY RT PCR: SARS Coronavirus 2 by RT PCR: NEGATIVE

## 2021-12-15 LAB — BASIC METABOLIC PANEL
Anion gap: 10 (ref 5–15)
BUN: 17 mg/dL (ref 8–23)
CO2: 22 mmol/L (ref 22–32)
Calcium: 10.4 mg/dL — ABNORMAL HIGH (ref 8.9–10.3)
Chloride: 102 mmol/L (ref 98–111)
Creatinine, Ser: 0.81 mg/dL (ref 0.61–1.24)
GFR, Estimated: >60 mL/min (ref >60)
Glucose, Bld: 126 mg/dL — ABNORMAL HIGH (ref 70–99)
Potassium: 3.7 mmol/L (ref 3.5–5.1)
Sodium: 134 mmol/L — ABNORMAL LOW (ref 135–145)

## 2021-12-15 LAB — FOLATE: Folate: 5.9 ng/mL — ABNORMAL LOW (ref >5.9)

## 2021-12-15 LAB — RESPIRATORY PANEL BY PCR

## 2021-12-15 LAB — ECHOCARDIOGRAM COMPLETE
AR max vel: 1.76 cm2
AV Area VTI: 1.97 cm2
AV Area mean vel: 1.96 cm2
AV Mean grad: 25 mmHg
AV Peak grad: 36.4 mmHg
Ao pk vel: 3.02 m/s
Area-P 1/2: 4.01 cm2
MV VTI: 3.71 cm2
S' Lateral: 3 cm

## 2021-12-15 LAB — TROPONIN I (HIGH SENSITIVITY)
Troponin I (High Sensitivity): 51 ng/L — ABNORMAL HIGH (ref <18)
Troponin I (High Sensitivity): 196 ng/L (ref <18)
Troponin I (High Sensitivity): 334 ng/L (ref <18)

## 2021-12-15 LAB — CBC
HCT: 36.8 % — ABNORMAL LOW (ref 39.0–52.0)
Hemoglobin: 12.4 g/dL — ABNORMAL LOW (ref 13.0–17.0)
MCH: 30.8 pg (ref 26.0–34.0)
MCHC: 33.7 g/dL (ref 30.0–36.0)
MCV: 91.5 fL (ref 80.0–100.0)
Platelets: 173 10*3/uL (ref 150–400)
RBC: 4.02 MIL/uL — ABNORMAL LOW (ref 4.22–5.81)
RDW: 14.5 % (ref 11.5–15.5)
WBC: 13.8 10*3/uL — ABNORMAL HIGH (ref 4.0–10.5)
nRBC: 0 % (ref 0.0–0.2)

## 2021-12-15 LAB — TSH: TSH: 0.579 u[IU]/mL (ref 0.350–4.500)

## 2021-12-15 LAB — PROTIME-INR
INR: 3.2 — ABNORMAL HIGH (ref 0.8–1.2)
Prothrombin Time: 32.6 seconds — ABNORMAL HIGH (ref 11.4–15.2)

## 2021-12-15 LAB — VITAMIN B12: Vitamin B-12: 74 pg/mL — ABNORMAL LOW (ref 180–914)

## 2021-12-15 LAB — CK: Total CK: 44 U/L — ABNORMAL LOW (ref 49–397)

## 2021-12-15 MED ORDER — ASPIRIN 81 MG PO CHEW
324.0000 mg | CHEWABLE_TABLET | Freq: Once | ORAL | Status: AC
  Administered 2021-12-15: 324 mg via ORAL
  Filled 2021-12-15: qty 4

## 2021-12-15 MED ORDER — ONDANSETRON HCL 4 MG/2ML IJ SOLN: 4.0000 mg | Freq: Four times a day (QID) | INTRAMUSCULAR | Status: DC | PRN

## 2021-12-15 MED ORDER — HYDROCHLOROTHIAZIDE 12.5 MG PO TABS
12.5000 mg | ORAL_TABLET | Freq: Every day | ORAL | Status: DC
  Administered 2021-12-17 – 2021-12-22 (×6): 12.5 mg via ORAL
  Filled 2021-12-15 (×7): qty 1

## 2021-12-15 MED ORDER — AMLODIPINE BESYLATE 10 MG PO TABS
10.0000 mg | ORAL_TABLET | Freq: Every day | ORAL | Status: DC
  Administered 2021-12-16 – 2021-12-21 (×6): 10 mg via ORAL
  Filled 2021-12-15 (×6): qty 1

## 2021-12-15 MED ORDER — WARFARIN - PHARMACIST DOSING INPATIENT: Freq: Every day | Status: DC

## 2021-12-15 MED ORDER — HYDROCHLOROTHIAZIDE 12.5 MG PO CAPS
12.5000 mg | ORAL_CAPSULE | Freq: Every day | ORAL | Status: DC
  Filled 2021-12-15: qty 1

## 2021-12-15 MED ORDER — ASPIRIN 81 MG PO TBEC
81.0000 mg | DELAYED_RELEASE_TABLET | Freq: Every day | ORAL | Status: DC
  Administered 2021-12-16 – 2021-12-22 (×6): 81 mg via ORAL
  Filled 2021-12-15 (×6): qty 1

## 2021-12-15 MED ORDER — DOXYCYCLINE HYCLATE 100 MG PO TABS
100.0000 mg | ORAL_TABLET | Freq: Two times a day (BID) | ORAL | Status: DC
  Administered 2021-12-15 – 2021-12-20 (×11): 100 mg via ORAL
  Filled 2021-12-15 (×11): qty 1

## 2021-12-15 MED ORDER — ACETAMINOPHEN 325 MG PO TABS: 650.0000 mg | ORAL_TABLET | Freq: Four times a day (QID) | ORAL | Status: DC | PRN

## 2021-12-15 MED ORDER — SODIUM CHLORIDE 0.9% FLUSH
3.0000 mL | Freq: Two times a day (BID) | INTRAVENOUS | Status: DC
  Administered 2021-12-15 – 2021-12-22 (×11): 3 mL via INTRAVENOUS

## 2021-12-15 MED ORDER — PHYTONADIONE 5 MG PO TABS
2.5000 mg | ORAL_TABLET | Freq: Once | ORAL | Status: AC
  Administered 2021-12-15: 2.5 mg via ORAL
  Filled 2021-12-15 (×2): qty 1

## 2021-12-15 MED ORDER — LUBIPROSTONE 24 MCG PO CAPS
24.0000 ug | ORAL_CAPSULE | Freq: Two times a day (BID) | ORAL | Status: DC
  Administered 2021-12-15 – 2021-12-22 (×12): 24 ug via ORAL
  Filled 2021-12-15 (×18): qty 1

## 2021-12-15 MED ORDER — ACETAMINOPHEN 650 MG RE SUPP: 650.0000 mg | Freq: Four times a day (QID) | RECTAL | Status: DC | PRN

## 2021-12-15 MED ORDER — IOHEXOL 350 MG/ML SOLN
75.0000 mL | Freq: Once | INTRAVENOUS | Status: AC | PRN
  Administered 2021-12-15: 75 mL via INTRAVENOUS

## 2021-12-15 MED ORDER — ZINC OXIDE 40 % EX OINT: TOPICAL_OINTMENT | CUTANEOUS | Status: DC | PRN

## 2021-12-15 MED ORDER — PERFLUTREN LIPID MICROSPHERE
1.0000 mL | INTRAVENOUS | Status: AC | PRN
  Administered 2021-12-15: 2 mL via INTRAVENOUS

## 2021-12-15 MED ORDER — SULFAMETHOXAZOLE-TRIMETHOPRIM 800-160 MG PO TABS
2.0000 | ORAL_TABLET | Freq: Two times a day (BID) | ORAL | Status: DC
  Administered 2021-12-15: 2 via ORAL
  Filled 2021-12-15: qty 2

## 2021-12-15 MED ORDER — ONDANSETRON HCL 4 MG PO TABS: 4.0000 mg | ORAL_TABLET | Freq: Four times a day (QID) | ORAL | Status: DC | PRN

## 2021-12-15 MED ORDER — DOXYCYCLINE HYCLATE 100 MG PO TABS
100.0000 mg | ORAL_TABLET | Freq: Once | ORAL | Status: AC
  Administered 2021-12-15: 100 mg via ORAL
  Filled 2021-12-15: qty 1

## 2021-12-15 MED ORDER — DOXAZOSIN MESYLATE 2 MG PO TABS
4.0000 mg | ORAL_TABLET | Freq: Every day | ORAL | Status: DC
  Administered 2021-12-16 – 2021-12-22 (×7): 4 mg via ORAL
  Filled 2021-12-15: qty 1
  Filled 2021-12-15 (×7): qty 2

## 2021-12-15 MED ORDER — CARVEDILOL 12.5 MG PO TABS
12.5000 mg | ORAL_TABLET | Freq: Two times a day (BID) | ORAL | Status: DC
  Administered 2021-12-15 – 2021-12-17 (×5): 12.5 mg via ORAL
  Filled 2021-12-15 (×7): qty 1

## 2021-12-15 MED ORDER — CYANOCOBALAMIN 1000 MCG/ML IJ SOLN
1000.0000 ug | Freq: Every day | INTRAMUSCULAR | Status: AC
  Administered 2021-12-15 – 2021-12-21 (×7): 1000 ug via INTRAMUSCULAR
  Filled 2021-12-15 (×9): qty 1

## 2021-12-15 MED ORDER — CLONIDINE HCL 0.2 MG PO TABS
0.2000 mg | ORAL_TABLET | Freq: Two times a day (BID) | ORAL | Status: DC
  Administered 2021-12-15 – 2021-12-21 (×12): 0.2 mg via ORAL
  Filled 2021-12-15 (×12): qty 1

## 2021-12-15 MED ORDER — SENNOSIDES-DOCUSATE SODIUM 8.6-50 MG PO TABS
1.0000 | ORAL_TABLET | Freq: Every evening | ORAL | Status: DC | PRN
  Administered 2021-12-18: 1 via ORAL
  Filled 2021-12-15: qty 1

## 2021-12-15 MED ORDER — LOSARTAN POTASSIUM 50 MG PO TABS
100.0000 mg | ORAL_TABLET | Freq: Every day | ORAL | Status: DC
  Administered 2021-12-16 – 2021-12-22 (×7): 100 mg via ORAL
  Filled 2021-12-15 (×7): qty 2

## 2021-12-15 NOTE — ED Notes (Signed)
Pt assisted to [facility]

## 2021-12-15 NOTE — Progress Notes (Signed)
  Echocardiogram 2D Echocardiogram has been performed.  [name] 12/15/2021, 9:54 AM

## 2021-12-15 NOTE — ED Notes (Signed)
Date and time results received: 12/15/21 0522 (use smartphrase ".now" to insert current time)  Test: troponin  Critical Value: 197  Name of Provider Notified: Opyd  Orders Received? Or Actions Taken?:  notified

## 2021-12-15 NOTE — Progress Notes (Addendum)
ANTICOAGULATION CONSULT NOTE - Initial Consult  Pharmacy Consult for warfarin  Indication: atrial fibrillation  Allergies  Allergen Reactions   Statins Other (See Comments)    "Muscles were messed up"    Penicillins Other (See Comments)    Childhood reaction. Unknown per Pt     Vital Signs: Temp: 98.5 F (36.9 C) (10/11 0923) Temp Source: Oral (10/11 0923) BP: 108/64 (10/11 1115) Pulse Rate: 39 (10/11 1115)  Labs: Recent Labs    12/14/21 2140 12/14/21 2343 12/15/21 0420  HGB 12.8*  --  12.4*  HCT 38.3*  --  36.8*  PLT 186  --  173  APTT 43*  --   --   LABPROT  --   --  32.6*  INR  --   --  3.2*  CREATININE 0.86  --  0.81  CKTOTAL  --   --  44*  TROPONINIHS 21* 51* 196*    CrCl cannot be calculated (Unknown ideal weight.).   Medical History: Past Medical History:  Diagnosis Date   Adenomatous colon polyp 07/2003   Heart valve problem    Dr [name]   Hypertension    Mitral valve disorders(424.0)    Nonrheumatic aortic valve stenosis 01/14/2021   Echo 9/22: EF 60-65, small pericardial effusion, mild AS (mean gradient 16 mmHg), trivial AI, mild MR (MR is eccentric - may be underestimated) // Echo 10/2021: EF 60-65, mod calcification of AV, trivial AI, mod-severe AS (DI 0.25, V-max 350 cm/s, mean 28.8), no RWMA, NL RVSF, severe LAE, mild RAE, small effusion w/o tamponade, ?mild-moderate MR (difficult due to eccentric jet)   Nonrheumatic mitral valve regurgitation 01/14/2021   Echo 10/2021: EF 60-65, moderate calcification of the aortic valve, trivial AI, moderate to severe aortic stenosis (DI 0.25, V-max 350 cm/s, mean gradient 28.8), no RWMA, normal RVSF, severe LAE, mild RAE, small pericardial effusion without tamponade, suspect mild-moderate MR (difficult to qualify degree due to eccentric jet with coanda affect)   Obesity    OSA (obstructive sleep apnea)    cpap   Other and unspecified hyperlipidemia    Permanent atrial fibrillation ([facility]CC)    Zio Monitor 5/22:  AFib - Avg HR 55; slowest 32; longest pause 3.9 sec; occ PVCs, 4 beat NSVT   Sleep apnea     Medications:  Scheduled:   amLODipine  10 mg Oral Daily   carvedilol  12.5 mg Oral BID WC   cloNIDine  0.2 mg Oral BID   doxazosin  4 mg Oral Daily   hydrochlorothiazide  12.5 mg Oral Daily   losartan  100 mg Oral Daily   lubiprostone  24 mcg Oral BID WC   sodium chloride flush  3 mL Intravenous Q12H   sulfamethoxazole-trimethoprim  2 tablet Oral Q12H   Warfarin - Pharmacist Dosing Inpatient   Does not apply q1600    Assessment: Patient admitted with chief compliant of generalized weakness and exertional dyspnea. Patient on warfarin at home, regimen is 7.'5mg'$  once daily. INR this morning is supratherapeutic at 3.2, patient received bactrim this morning after initial INR elevation. Anticipate INR will continue to increase in the setting on bactrim for cellulitis.   Spoke with patient regarding pencillin allergy, he states it is a remote history when he was a child. He cannot remember tolerating any other anitbiotics in recent years. He does note that the reaction from childhood was not anaphylaxis. Provider changed antibiotic to doxycycline. Potential for doxycycline to increase INR, not as significant as DDI with bactrim.  No overt bleeding noted.   Goal of Therapy:  INR 2-3 Monitor platelets by anticoagulation protocol: Yes   Plan:  Hold warfarin for today.  Daily INR.   [name] 12/15/2021,11:47 AM

## 2021-12-15 NOTE — ED Notes (Signed)
Attempted to ambulate and pt CO shortness of breath, body aches all over and intense back pain when standing up. Had previous back surgery and endorsed pain may be from the previous operation and not ambulation. Unable to walk to bathroom across the [place] or stand up for more than 20 seconds.

## 2021-12-15 NOTE — ED Notes (Signed)
Entered pt's room to give morning meds. Pt and family notified this RN that pt already took his morning meds this morning brought from home. This RN educated pt and family that we will be giving his meds so that we can document and keep track of his meds for accuracy and pt safety while he is in the hospital. Pt and family verbalize understanding.

## 2021-12-15 NOTE — Progress Notes (Addendum)
   Patient seen and examined at bedside, patient admitted after midnight, please see earlier detailed admission note by Vianne Bulls, MD. Briefly, patient is a 71 y.o. male with a history of hypertension, aortic stenosis and atrial fibrillation on Coumadin who presented secondary weakness and dyspnea on exertion.   Subjective: Patient reports no symptoms at rest but does have some dyspnea on exertion. He reports some runny nose issues recently. No other concerns.  BP 125/71 (BP Location: Left Arm)   Pulse (!) 52   Temp 97.8 F (36.6 C) (Oral)   Resp (!) 30   SpO2 95%   General exam: Appears calm and comfortable Respiratory system: Clear to auscultation. Respiratory effort normal. Cardiovascular system: S1 & S2 heard. Bradycardia. Irregular rhythm. Gastrointestinal system: Abdomen is nondistended, soft and nontender. No organomegaly or masses felt. Normal bowel sounds heard. Central nervous system: Alert and oriented. No focal neurological deficits. Musculoskeletal: No edema. No calf tenderness Skin: No cyanosis. No rashes Psychiatry: Judgement and insight appear normal. Mood & affect appropriate.   Brief assessment/Plan:  Generalized weakness Dyspnea on exertion Nonspecific. Possibly related to bradycardia vs ACS vs infection vs aortic stenosis. Troponin elevated to 196 this morning. No symptoms at rest. Heart rate as low as 40s while on Coreg. EKG without strong evidence for ACS. -Trend troponin -Follow-up Transthoracic Echocardiogram result -Cardiology consult for bradycardia and elevated troponin  Elevated troponin Hs Troponin trend of 21 > 51 > 196. No chest pain since admission but continues to have exertional dyspnea. Cardiology consulted as mentioned above. -Continue trend troponin until peak  Abdominal wall dermatitis Distribution seems more consistent with intertrigo than with cellulitis. Clinically, patient does have a mildly elevated WBC but no fever, tenderness or  warmth of the area. Dermatitis is confined to skin folds of lower panus and inguinal areas. Patient started on antibiotics; will continue for no more than 5 days -Continue doxycycline -Keep area clean and dry -Barrier cream  Vitamin B12 deficiency Vitamin B12 level of 74. -Vitamin B12 supplementation -Check folate level  Aortic stenosis Transthoracic Echocardiogram pending.  Atrial fibrillation -Continue Coumadin -Will continue Coreg for now pending cardiology recommendations  Primary hypertension OSA -Per H&P  Family communication: Nephew at bedside DVT prophylaxis: Coumadin Disposition: Discharge home vs SNF pending cardiology recommendations  Cordelia Poche, MD [facility] 12/15/2021, 1:55 PM

## 2021-12-15 NOTE — Progress Notes (Signed)
ANTICOAGULATION CONSULT NOTE - Initial Consult  Pharmacy Consult for Coumadin Indication: atrial fibrillation  Allergies  Allergen Reactions   Statins    Penicillins     REACTION: unspecified    Patient Measurements:    Vital Signs: Temp: 98.6 F (37 C) (10/11 0502) Temp Source: Oral (10/11 0502) BP: 150/81 (10/11 0600) Pulse Rate: 77 (10/11 0600)  Labs: Recent Labs    12/14/21 2140 12/14/21 2343 12/15/21 0420  HGB 12.8*  --  12.4*  HCT 38.3*  --  36.8*  PLT 186  --  173  APTT 43*  --   --   LABPROT  --   --  32.6*  INR  --   --  3.2*  CREATININE 0.86  --  0.81  CKTOTAL  --   --  44*  TROPONINIHS 21* 51* 196*    CrCl cannot be calculated (Unknown ideal weight.).   Medical History: Past Medical History:  Diagnosis Date   Adenomatous colon polyp 07/2003   Heart valve problem    Dr [name]   Hypertension    Mitral valve disorders(424.0)    Nonrheumatic aortic valve stenosis 01/14/2021   Echo 9/22: EF 60-65, small pericardial effusion, mild AS (mean gradient 16 mmHg), trivial AI, mild MR (MR is eccentric - may be underestimated) // Echo 10/2021: EF 60-65, mod calcification of AV, trivial AI, mod-severe AS (DI 0.25, V-max 350 cm/s, mean 28.8), no RWMA, NL RVSF, severe LAE, mild RAE, small effusion w/o tamponade, ?mild-moderate MR (difficult due to eccentric jet)   Nonrheumatic mitral valve regurgitation 01/14/2021   Echo 10/2021: EF 60-65, moderate calcification of the aortic valve, trivial AI, moderate to severe aortic stenosis (DI 0.25, V-max 350 cm/s, mean gradient 28.8), no RWMA, normal RVSF, severe LAE, mild RAE, small pericardial effusion without tamponade, suspect mild-moderate MR (difficult to qualify degree due to eccentric jet with coanda affect)   Obesity    OSA (obstructive sleep apnea)    cpap   Other and unspecified hyperlipidemia    Permanent atrial fibrillation ([place])    Zio Monitor 5/22: AFib - Avg HR 55; slowest 32; longest pause 3.9 sec; occ  PVCs, 4 beat NSVT   Sleep apnea     Medications:  No current facility-administered medications on file prior to encounter.   Current Outpatient Medications on File Prior to Encounter  Medication Sig Dispense Refill   amLODipine (NORVASC) 10 MG tablet Take 1 tablet (10 mg total) by mouth daily. 90 tablet 3   carvedilol (COREG) 12.5 MG tablet TAKE 1 AND 1/2 TABLET BY MOUTH TWO TIMES A DAY 270 tablet 3   cloNIDine (CATAPRES) 0.2 MG tablet Take 1 tablet (0.2 mg total) by mouth 2 (two) times daily. 180 tablet 3   doxazosin (CARDURA) 4 MG tablet Take 1 tablet (4 mg total) by mouth daily. 90 tablet 3   ezetimibe (ZETIA) 10 MG tablet Take 1 tablet (10 mg total) by mouth daily. 90 tablet 3   hydrochlorothiazide (MICROZIDE) 12.5 MG capsule Take 1 capsule (12.5 mg total) by mouth daily. 90 capsule 3   Iron, Ferrous Sulfate, 325 (65 Fe) MG TABS Take 325 mg by mouth daily. 90 tablet 3   ketoconazole (NIZORAL) 2 % cream Apply 1 application topically 2 (two) times daily as needed for irritation. 30 g 2   losartan (COZAAR) 100 MG tablet Take 1 tablet (100 mg total) by mouth daily. 90 tablet 3   lubiprostone (AMITIZA) 24 MCG capsule Take 1 capsule (24 mcg  total) by mouth 2 (two) times daily. 180 capsule 3   meloxicam (MOBIC) 15 MG tablet Take 1 tablet (15 mg total) by mouth daily. 90 tablet 3   OMEGA-3 FATTY ACIDS PO Take 1,200 mg by mouth in the morning and at bedtime.     warfarin (COUMADIN) 5 MG tablet TAKE 1 TO 1 AND 1/2 TABLETS BY MOUTH DAILY AS DIRECTED BY COUMADIN CLINIC 50 tablet 3     Assessment: 71 y.o. male admitted with weakness and DOE, h/o Afib, to continue Coumadin.  Current home regimen Coumadin 7.5 mg daily.  INR 3.2 this morning  Goal of Therapy:  INR 2-3 Monitor platelets by anticoagulation protocol: Yes   Plan:  F/U plan for oral antibiotics with MD, as SMZ/TMP not preferred due to interaction with warfarin.  F/U PCN allergy and consider cephalosporin if mild allergy.  Caryl Pina 12/15/2021,6:14 AM

## 2021-12-15 NOTE — ED Provider Notes (Signed)
Patient was received at shift change from Gust Brooms PA-C please see her note for full detail  Patient with medical history including obesity, heart valve, A-fib currently on warfarin, hypertension, presents to the emergency department with complaints of generalized weakness that started today, patient typically walks with a walker but today while trying to walk he was more short of breath, states he just feels generalized weakness, he was endorsing some epigastric tenderness but no nausea no vomiting denies any urinary symptoms.  No recent falls, denies any headaches change in vision paresthesias in the upper and lower extremities.  Per previous provider follow-up on CT imaging and dispo appropriately. Physical Exam  BP (!) 142/80 (BP Location: Left Arm)   Pulse 84   Temp 98.6 F (37 C) (Oral)   Resp 16   SpO2 96%   Physical Exam Vitals and nursing note reviewed.  Constitutional:      General: He is not in acute distress.    Appearance: He is not ill-appearing.  HENT:     Head: Normocephalic and atraumatic.     Comments: No deformity of the head present no raccoon eyes or battle sign noted.    Nose: No congestion.     Mouth/Throat:     Mouth: Mucous membranes are moist.     Pharynx: Oropharynx is clear.     Comments: No trismus no torticollis no oral trauma Eyes:     Extraocular Movements: Extraocular movements intact.     Conjunctiva/sclera: Conjunctivae normal.     Pupils: Pupils are equal, round, and reactive to light.  Cardiovascular:     Rate and Rhythm: Normal rate and regular rhythm.     Pulses: Normal pulses.     Heart sounds: No murmur heard.    No friction rub. No gallop.  Pulmonary:     Effort: No respiratory distress.     Breath sounds: No wheezing, rhonchi or rales.  Abdominal:     Palpations: Abdomen is soft.     Tenderness: There is no abdominal tenderness. There is no right CVA tenderness or left CVA tenderness.  Musculoskeletal:     Right lower leg: No  edema.     Left lower leg: No edema.  Skin:    General: Skin is warm and dry.  Neurological:     Mental Status: He is alert.     Comments: No facial asymmetry no difficulty with word finding following two-step commands no unilateral weakness present.   Psychiatric:        Mood and Affect: Mood normal.     Procedures  Procedures  ED Course / MDM   Clinical Course as of 12/15/21 0513  Tue Dec 14, 2021  2331 WBC, UA: 0-5 [WF]    Clinical Course User Index [WF] Marcello Fennel, PA-C   Medical Decision Making Amount and/or Complexity of Data Reviewed Labs: ordered. Decision-making details documented in ED Course. Radiology: ordered.  Risk Prescription drug management. Decision regarding hospitalization.      Co morbidities that complicate the patient evaluation  A-fib, on Eliquis  Social Determinants of Health:  N/A    Lab Tests:  I Ordered, and personally interpreted labs.  The pertinent results include: CBC shows leukocytosis of 15.4, normocytic anemia hemoglobin 12.8, CMP shows glucose 147 calcium 10.6 albumin 3.4, UA is positive for red blood cells rare bacteria, APTT 43, COVID-negative first troponin is 22nd troponin is 51   Imaging Studies ordered:  I ordered imaging studies including chest x-ray, CT chest,  pelvis I independently visualized and interpreted imaging which showed chest x-ray unremarkable, CT reveals multivessel coronary calcification, aortic valvular Calcification, small pericardial effusion moderate stool burden without obstruction I agree with the radiologist interpretation   Cardiac Monitoring:  The patient was maintained on a cardiac monitor.  I personally viewed and interpreted the cardiac monitored which showed an underlying rhythm of: A-fib without signs of ischemia   Medicines ordered and prescription drug management:  I ordered medication including Keflex I have reviewed the patients home medicines and have made  adjustments as needed  Critical Interventions:  N/A   Reevaluation:  Presents with weakness, on my exam he was not unilateral, attempted to ambulate the patient but that was unsuccessful his as he is very short winded.  CT scan shows calcification of the cardiac arteries as well as calcification of the aortic valve with an elevated troponins we will consult with cards for further recommendations  Update patient needs recommendations he is got this plan will admit to medicine  Consultations Obtained:  I requested consultation with the Dr. Kalman Shan,  and discussed lab and imaging findings as well as pertinent plan - they recommend: Medicine admission perform echocardiogram if negative consult cardiology for further intervention. Spoke with Dr. Myna Hidalgo who admit the patient.   Test Considered:  N/A   Rule out  Low suspicion for CVA she has no focal deficit present my exam.  Low suspicion for dissection of the vertebral or carotid artery as presentation atypical of etiology.  Low suspicion for meningitis as she has no meningeal sign present. I have low suspicion for ACS as history is atypical, EKG without signs of ischemia.  Patient does have elevated troponins I suspect secondary due to demand ischemia we will need to continue to trend.  low suspicion for PE as patient denies pleuritic chest pain,  patient denies leg pain, no pedal edema noted on exam, patient vital signs reassuring. low suspicion for AAA or aortic dissection as history is atypical, patient has low risk factors.  Low suspicion for systemic infection as patient is nontoxic-appearing, vital signs reassuring, no obvious source infection noted on exam.      Dispostion and problem list  After consideration of the diagnostic results and the patients response to treatment, I feel that the patent would benefit from admission.  Weakness-concerned secondary due to symptomatic aortic valve stenosis, cardiology recommends echocardiogram  if negative consult cardiology for further evaluation also recommend trending troponins if continues to elevate consult cards. Cellulitis-patient has cellulitis of the pannus, will start antibiotics will need further evaluation.           Marcello Fennel, PA-C 12/15/21 [place], April, MD 12/15/21 [phone]

## 2021-12-15 NOTE — H&P (Signed)
History and Physical    SHAHRUKH PASCH UXN:[id number] DOB: 1950/03/18 DOA: 12/14/2021  PCP: [name], MD   Patient coming from: Home   Chief Complaint: weakness, DOE   HPI: [name] is a pleasant 71 y.o. male with medical history significant for resistant hypertension, aortic stenosis, and atrial fibrillation on warfarin who presents emergency department with generalized weakness and exertional dyspnea.  Patient reports that he was having an uneventful day yesterday until the afternoon when he developed increased exertional dyspnea, was experiencing what he thought to be indigestion, and generalized weakness.  He was unable to get up from his chair or stand without assistance.  The indigestion resolved spontaneously after approximately an hour.  He has not noticed any subjective fever or chills.  Denies cough.  ED Course: Upon arrival to the ED, patient is found to be afebrile and saturating well on room air with stable blood pressure.  EKG features atrial fibrillation.  Chest x-ray notable for cardiomegaly but no acute findings.  CT of the chest/abdomen/pelvis is negative for acute abnormality but notable for extensive multivessel coronary artery calcification and aortic valvular calcifications.  Blood work notable for leukocytosis to 15,900 and troponin of 21 and then 51.    ED PA discussed case with cardiology who recommended repeating an echocardiogram.  Patient was started on antibiotics in the ED.  Review of Systems:  All other systems reviewed and apart from HPI, are negative.  Past Medical History:  Diagnosis Date   Adenomatous colon polyp 07/2003   Heart valve problem    Dr [name]   Hypertension    Mitral valve disorders(424.0)    Nonrheumatic aortic valve stenosis 01/14/2021   Echo 9/22: EF 60-65, small pericardial effusion, mild AS (mean gradient 16 mmHg), trivial AI, mild MR (MR is eccentric - may be underestimated) // Echo 10/2021: EF 60-65, mod calcification  of AV, trivial AI, mod-severe AS (DI 0.25, V-max 350 cm/s, mean 28.8), no RWMA, NL RVSF, severe LAE, mild RAE, small effusion w/o tamponade, ?mild-moderate MR (difficult due to eccentric jet)   Nonrheumatic mitral valve regurgitation 01/14/2021   Echo 10/2021: EF 60-65, moderate calcification of the aortic valve, trivial AI, moderate to severe aortic stenosis (DI 0.25, V-max 350 cm/s, mean gradient 28.8), no RWMA, normal RVSF, severe LAE, mild RAE, small pericardial effusion without tamponade, suspect mild-moderate MR (difficult to qualify degree due to eccentric jet with coanda affect)   Obesity    OSA (obstructive sleep apnea)    cpap   Other and unspecified hyperlipidemia    Permanent atrial fibrillation ([place])    Zio Monitor 5/22: AFib - Avg HR 55; slowest 32; longest pause 3.9 sec; occ PVCs, 4 beat NSVT   Sleep apnea     Past Surgical History:  Procedure Laterality Date   COLONOSCOPY  06/09/2015   per Dr. Fuller Plan, adenomatous polyp, repeat in 7 yrs   FOOT SURGERY  at age 43   LUMBAR LAMINECTOMY/DECOMPRESSION MICRODISCECTOMY N/A 11/21/2012   Procedure: Lumbar Three to Lumbar Five Laminectomy;  Surgeon: Floyce Stakes, MD;  Location: [facility];  Service: Neurosurgery;  Laterality: N/A;  Lumbar Three to Lumbar Five Laminectomy    Social History:   reports that he has never smoked. He has never used smokeless tobacco. He reports that he does not drink alcohol and does not use drugs.  Allergies  Allergen Reactions   Statins    Penicillins     REACTION: unspecified    Family History  Problem Relation Age of Onset   Alzheimer's disease Mother    Stroke Father    Heart disease Father    Hypertension Father    Alzheimer's disease Maternal Aunt        x 3   Colon cancer Neg Hx    Hypercalcemia Neg Hx      Prior to Admission medications   Medication Sig Start Date End Date Taking? Authorizing Provider  amLODipine (NORVASC) 10 MG tablet Take 1 tablet (10 mg total) by mouth  daily. 07/15/21   [name], MD  carvedilol (COREG) 12.5 MG tablet TAKE 1 AND 1/2 TABLET BY MOUTH TWO TIMES A DAY 01/26/21   Fay Records, MD  cloNIDine (CATAPRES) 0.2 MG tablet Take 1 tablet (0.2 mg total) by mouth 2 (two) times daily. 07/15/21   [name], MD  doxazosin (CARDURA) 4 MG tablet Take 1 tablet (4 mg total) by mouth daily. 07/15/21   [name], MD  ezetimibe (ZETIA) 10 MG tablet Take 1 tablet (10 mg total) by mouth daily. 07/15/21   [name], MD  hydrochlorothiazide (MICROZIDE) 12.5 MG capsule Take 1 capsule (12.5 mg total) by mouth daily. 07/15/21   [name], MD  Iron, Ferrous Sulfate, 325 (65 Fe) MG TABS Take 325 mg by mouth daily. 07/16/21   [name], MD  ketoconazole (NIZORAL) 2 % cream Apply 1 application topically 2 (two) times daily as needed for irritation. 06/29/20   [name], MD  losartan (COZAAR) 100 MG tablet Take 1 tablet (100 mg total) by mouth daily. 07/15/21   [name], MD  lubiprostone (AMITIZA) 24 MCG capsule Take 1 capsule (24 mcg total) by mouth 2 (two) times daily. 07/15/21   [name], MD  meloxicam (MOBIC) 15 MG tablet Take 1 tablet (15 mg total) by mouth daily. 07/15/21   [name], MD  OMEGA-3 FATTY ACIDS PO Take 1,200 mg by mouth in the morning and at bedtime.    [provider]  warfarin (COUMADIN) 5 MG tablet TAKE 1 TO 1 AND 1/2 TABLETS BY MOUTH DAILY AS DIRECTED BY [facility]IN CLINIC 08/05/21   Fay Records, MD    Physical Exam: Vitals:   12/14/21 2140 12/15/21 0158 12/15/21 0502 12/15/21 0534  BP: (!) 140/88 (!) 140/85 (!) 142/80 (!) 150/76  Pulse: 77 90 84 82  Resp: '16 17 16   '$ Temp: 98.4 F (36.9 C)  98.6 F (37 C)   TempSrc: Oral  Oral   SpO2: 96% 96% 96% 96%    Constitutional: NAD, calm  Eyes: PERTLA, lids and conjunctivae normal ENMT: Mucous membranes are moist. Posterior pharynx clear of any exudate or lesions.   Neck: supple, no masses  Respiratory:  no wheezing, no crackles. No  accessory muscle use.  Cardiovascular: Rate ~100 and irregularly irregular. No significant JVD. Abdomen: No distension, no tenderness, soft. Bowel sounds active.  Musculoskeletal: no clubbing / cyanosis. No joint deformity upper and lower extremities.   Skin: Erythema and warmth involving lower abdominal wall without crepitus or fluctuance. Warm, dry, well-perfused. Neurologic: CN 2-12 grossly intact. Sensation to light touch intact. Strength 5/5 in all 4 limbs. Alert and oriented.  Psychiatric: Pleasant. Cooperative.    Labs and Imaging on Admission: I have personally reviewed following labs and imaging studies  CBC: Recent Labs  Lab 12/14/21 2140 12/15/21 0420  WBC 15.9* 13.8*  NEUTROABS 14.6*  --   HGB 12.8* 12.4*  HCT 38.3*  36.8*  MCV 92.1 91.5  PLT 186 631   Basic Metabolic Panel: Recent Labs  Lab 12/14/21 2140 12/15/21 0420  NA 137 134*  K 4.0 3.7  CL 103 102  CO2 25 22  GLUCOSE 147* 126*  BUN 20 17  CREATININE 0.86 0.81  CALCIUM 10.6* 10.4*   GFR: CrCl cannot be calculated (Unknown ideal weight.). Liver Function Tests: Recent Labs  Lab 12/14/21 2140  AST 22  ALT 20  ALKPHOS 82  BILITOT 0.9  PROT 6.5  ALBUMIN 3.4*   No results for input(s): "LIPASE", "AMYLASE" in the last 168 hours. No results for input(s): "AMMONIA" in the last 168 hours. Coagulation Profile: Recent Labs  Lab 12/08/21 1513 12/15/21 0420  INR 2.7 3.2*   Cardiac Enzymes: Recent Labs  Lab 12/15/21 0420  CKTOTAL 44*   BNP (last 3 results) No results for input(s): "PROBNP" in the last 8760 hours. HbA1C: No results for input(s): "HGBA1C" in the last 72 hours. CBG: No results for input(s): "GLUCAP" in the last 168 hours. Lipid Profile: No results for input(s): "CHOL", "HDL", "LDLCALC", "TRIG", "CHOLHDL", "LDLDIRECT" in the last 72 hours. Thyroid Function Tests: Recent Labs    12/15/21 0420  TSH 0.579   Anemia Panel: Recent Labs    12/15/21 0420  VITAMINB12 74*    Urine analysis:    Component Value Date/Time   COLORURINE YELLOW 12/14/2021 2138   APPEARANCEUR CLEAR 12/14/2021 2138   LABSPEC 1.015 12/14/2021 2138   PHURINE 7.0 12/14/2021 2138   GLUCOSEU NEGATIVE 12/14/2021 2138   GLUCOSEU NEGATIVE 06/29/2020 0959   HGBUR MODERATE (A) 12/14/2021 2138   HGBUR trace-lysed 11/02/2009 0834   BILIRUBINUR NEGATIVE 12/14/2021 2138   BILIRUBINUR neg 07/10/2020 [place] 12/14/2021 2138   PROTEINUR NEGATIVE 12/14/2021 2138   UROBILINOGEN 0.2 07/10/2020 0917   UROBILINOGEN 0.2 06/29/2020 0959   NITRITE NEGATIVE 12/14/2021 2138   LEUKOCYTESUR NEGATIVE 12/14/2021 2138   Sepsis Labs: '@LABRCNTIP'$ (procalcitonin:4,lacticidven:4) ) Recent Results (from the past 240 hour(s))  SARS Coronavirus 2 by RT PCR (hospital order, performed in [place] hospital lab) *cepheid single result test* Anterior Nasal Swab     Status: None   Collection Time: 12/14/21 11:43 PM   Specimen: Anterior Nasal Swab  Result Value Ref Range Status   SARS Coronavirus 2 by RT PCR NEGATIVE NEGATIVE Final    Comment: (NOTE) SARS-CoV-2 target nucleic acids are NOT DETECTED.  The SARS-CoV-2 RNA is generally detectable in upper and lower respiratory specimens during the acute phase of infection. The lowest concentration of SARS-CoV-2 viral copies this assay can detect is 250 copies / mL. A negative result does not preclude SARS-CoV-2 infection and should not be used as the sole basis for treatment or other patient management decisions.  A negative result may occur with improper specimen collection / handling, submission of specimen other than nasopharyngeal swab, presence of viral mutation(s) within the areas targeted by this assay, and inadequate number of viral copies (<250 copies / mL). A negative result must be combined with clinical observations, patient history, and epidemiological information.  Fact Sheet for Patients:    https://www.patel.info/  Fact Sheet for Healthcare Providers: https://Bettinger.com/  This test is not yet approved or  cleared by the [place] FDA and has been authorized for detection and/or diagnosis of SARS-CoV-2 by FDA under an Emergency Use Authorization (EUA).  This EUA will remain in effect (meaning this test can be used) for the duration of the COVID-19 declaration under Section 564(b)(1) of  the Act, 21 U.S.C. section 360bbb-3(b)(1), unless the authorization is terminated or revoked sooner.  Performed at [facility], [place] [address], [place], [place] [address]      Radiological Exams on Admission: CT CHEST ABDOMEN PELVIS W CONTRAST  Result Date: 12/15/2021 CLINICAL DATA:  Aortic aneurysm, known or suspected. Generalized weakness. EXAM: CT CHEST, ABDOMEN, AND PELVIS WITH CONTRAST TECHNIQUE: Multidetector CT imaging of the chest, abdomen and pelvis was performed following the standard protocol during bolus administration of intravenous contrast. RADIATION DOSE REDUCTION: This exam was performed according to the departmental dose-optimization program which includes automated exposure control, adjustment of the mA and/or kV according to patient size and/or use of iterative reconstruction technique. CONTRAST:  38m OMNIPAQUE IOHEXOL 350 MG/ML SOLN COMPARISON:  None Available. FINDINGS: CT CHEST FINDINGS Cardiovascular: Mild global cardiomegaly. Extensive multi-vessel coronary artery calcification. Aortic valvular calcifications noted. Small pericardial effusion without definite CT evidence of cardiac tamponade. The central pulmonary arteries are enlarged in keeping with changes of pulmonary arterial hypertension. Mild atherosclerotic calcification within the thoracic aorta. No aortic aneurysm. Mediastinum/Nodes: No enlarged mediastinal, hilar, or axillary lymph nodes. Thyroid gland, trachea, and esophagus demonstrate no significant findings.  Lungs/Pleura: Mild bibasilar atelectasis. Lungs are otherwise clear. No pneumothorax or pleural effusion. No central obstructing lesion. Musculoskeletal: Osseous structures are age-appropriate. No acute bone abnormality. No lytic or blastic bone lesion. CT ABDOMEN PELVIS FINDINGS Hepatobiliary: No focal liver abnormality is seen. No gallstones, gallbladder wall thickening, or biliary dilatation. Pancreas: Unremarkable Spleen: Multiple hypoenhancing lesions are seen within the spleen which are indeterminate on this single phase examination but may represent small cysts or hemangioma in a patient without a history of malignancy. The spleen is not enlarged. Adrenals/Urinary Tract: The adrenal glands are unremarkable. The kidneys are normal in size and position. Bilateral nonobstructing nephrolithiasis is present with scattered calculi bilaterally measuring up to 3 mm. There is an additional nonobstructing 15 mm calculus within the left renal pelvis. No hydronephrosis. No ureteral calculi. No perinephric inflammatory stranding or fluid collections are identified. The bladder is unremarkable. Stomach/Bowel: Moderate colonic stool burden without evidence of obstruction. The stomach, small bowel, and large bowel are otherwise unremarkable. Appendix normal. No free intraperitoneal gas or fluid. Vascular/Lymphatic: Aortic atherosclerosis. No aortic aneurysm identified. No enlarged abdominal or pelvic lymph nodes. Reproductive: Prostate is unremarkable. Other: Infiltration within the subcutaneous fat of the inferior pannus with associated dermal thickening is partially visualized on this examination. This is nonspecific and may be chronic in nature, however, acute inflammatory conditions such as cellulitis could appear in this fashion. No abdominal wall hernia. Musculoskeletal: Advanced degenerative changes are seen asymmetrically within the right hip right L5 laminotomy has been performed. Advanced degenerative changes are  seen throughout the thoracolumbar spine with grade 1 anterolisthesis of L4-L5. No acute bone abnormality. No lytic or blastic bone lesion. IMPRESSION: 1. No acute intrathoracic or intra-abdominal pathology identified. No aortic aneurysm. 2. Extensive multi-vessel coronary artery calcification. 3. Aortic valvular calcifications noted. Echocardiography may be helpful to assess the degree of valvular dysfunction. 4. Mild global cardiomegaly. Small pericardial effusion. 5. Morphologic changes in keeping with pulmonary arterial hypertension. 6. Bilateral nonobstructing nephrolithiasis. No hydronephrosis. 7. Moderate colonic stool burden without evidence of obstruction. 8. Infiltration within the subcutaneous fat of the inferior pannus with associated dermal thickening. This may be chronic in nature, however, acute inflammatory conditions such as cellulitis could appear in this fashion. Correlation with clinical examination is recommended. Aortic Atherosclerosis (ICD10-I70.0). Electronically Signed   By: AFidela Salisbury  M.D.   On: 12/15/2021 00:35   DG Chest 1 View  Result Date: 12/14/2021 CLINICAL DATA:  Weakness starting today. EXAM: CHEST  1 VIEW COMPARISON:  None 914 FINDINGS: Shallow inspiration. Cardiac enlargement. No vascular congestion, edema, or consolidation. No pleural effusions. No pneumothorax. Mediastinal contours appear intact. Calcification of the aorta. Degenerative changes in the spine and shoulders. IMPRESSION: Cardiac enlargement.  No evidence of active pulmonary disease. Electronically Signed   By: Lucienne Capers M.D.   On: 12/14/2021 22:24    EKG: Independently reviewed. Atrial fibrillation, LAD, non-specific IVCD.   Assessment/Plan   1. Generalized weakness  - Presents with DOE, indigestion, and generalized weakness, now unable to stand without assistance  - No focal neurologic deficit identified    - Check serum CK, B12, and TSH, trend troponin, and check echo  2. Elevated  troponin  - Troponin was 21 and then 51 in ED  - He had indigestion yesterday but no chest discomfort since arrival in ED  - ED PA discussed with cardiology fellow who recommended echocardiogram  - Continue cardiac monitoring, trend troponin, check echocardiogram    3. Aortic stenosis  - Updating echo per cardiology recommendation    4. Abdominal wall cellulitis  - Not septic on admission and no gas or fluid collection on CT  - He has penicillin allergy, will treat with Bactrim    5. Atrial fibrillation  - Continue warfarin, Coreg    6. Resistant hypertension  - Continue Norvasc, Coreg, clonidine, HCTZ, losartan, Cardura    7. OSA  - CPAP qHS    DVT prophylaxis: Warfarin  Code Status: Full  Level of Care: Level of care: Telemetry Cardiac Family Communication: Son at bedside  Disposition Plan:  Patient is from: Home  Anticipated d/c is to: TBD Anticipated d/c date is: Possibly as early as 12/16/21  Patient currently: Pending echocardiogram, additional lab w/u (repeat troponin, B12, CK, TSH), may need PT eval  Consults called: none  Admission status: Observation     Vianne Bulls, MD [facility]  12/15/2021, 5:54 AM

## 2021-12-15 NOTE — Consult Note (Signed)
Cardiology Consultation   Patient ID: [name] MRN: [id number]; DOB: November 18, 1950  Admit date: 12/14/2021 Date of Consult: 12/15/2021  PCP:  [name], MD   [facility] Cardiologist:  [name], MD     Patient Profile:   [name] is a 71 y.o. male with a hx of permanent afib, bradycardia, mild MR, mild aortic stenosis, hypertension, hyperlipidemia, OSA, obesity who is being seen 12/15/2021 for the evaluation of elevated troponin at the request of Dr. Lonny Prude.  History of Present Illness:   [name] is a 71 year old male with past medical history noted above.  He has been followed by Dr. Harrington Challenger as an outpatient.  He has a history of nonrheumatic mitral valve regurgitation.  Assessment has been difficult due to acoustic windows and eccentric jet.  It was recommended that he consider TEE to further determine degree of regurgitation.   He was seen in the office 01/2021 with Richardson Dopp after wearing a monitor in May 2022 which noted some pauses with the longest being 3.9 seconds.  His carvedilol was reduced.  Blood pressure was noted to be well controlled.  He was continued on amlodipine 10 mg daily, carvedilol 18.75 mg twice daily clonidine 0.2 mg twice daily doxazosin 4 mg daily, HCTZ 12.5 mg daily and losartan 100 mg daily.  He was noted to be rate controlled in atrial fibrillation with continuation of his Coumadin.  He has a noted history of statin intolerance currently being treated with Zetia.  He was not interested in pursuing alternative therapies.  Repeat echocardiogram was done as an outpatient 10/2021 with moderate calcification of aortic valve, moderate aortic stenosis with AVA and DI suggest more moderate-to-severe AS. Aortic valve mean gradient measures 28.8 mmHg.  Normal RV size and function, severely dilated left atrium, small pericardial effusion which was circumferential but no evidence of tamponade suspected mild to moderate MR.  He was last  seen in the office shortly after echocardiogram by Dr. Harrington Challenger.  It was recommended that he have a repeat echocardiogram in several months as an outpatient then would consider TEE to further define MR if needed.  He presented to the ED on 10/10 with complaints of generalized weakness and exertional dyspnea. Reports he was in his recliner and felt very weak, was unable to get up, felt warm to touch and had generalized body aches. EMS was called.   Labs on admission showed sodium 137, potassium 4, creatinine 0.8, high-sensitivity troponin 21>> 51>> 196>> 334, WBC 15.9, hemoglobin 12.8, INR 3.2, TSH 0.59.  EKG showed atrial fibrillation, 80 bpm, left bundle branch block (old).  Chest x-ray with no evidence of edema.  CT chest abdomen pelvis with extensive multi vessel coronary artery calcification, small pericardial effusion, pulmonary arterial hypertension, infiltration within the subcutaneous fat of the inferior pannus with associated dermal thickening. He was admitted to internal medicine for further management and placed on bactrim. Cardiology asked to evaluate in the setting of elevated troponin.   Past Medical History:  Diagnosis Date   Adenomatous colon polyp 07/2003   Heart valve problem    Dr [name]   Hypertension    Mitral valve disorders(424.0)    Nonrheumatic aortic valve stenosis 01/14/2021   Echo 9/22: EF 60-65, small pericardial effusion, mild AS (mean gradient 16 mmHg), trivial AI, mild MR (MR is eccentric - may be underestimated) // Echo 10/2021: EF 60-65, mod calcification of AV, trivial AI, mod-severe AS (DI 0.25, V-max 350 cm/s, mean 28.8),  no RWMA, NL RVSF, severe LAE, mild RAE, small effusion w/o tamponade, ?mild-moderate MR (difficult due to eccentric jet)   Nonrheumatic mitral valve regurgitation 01/14/2021   Echo 10/2021: EF 60-65, moderate calcification of the aortic valve, trivial AI, moderate to severe aortic stenosis (DI 0.25, V-max 350 cm/s, mean gradient 28.8), no RWMA,  normal RVSF, severe LAE, mild RAE, small pericardial effusion without tamponade, suspect mild-moderate MR (difficult to qualify degree due to eccentric jet with coanda affect)   Obesity    OSA (obstructive sleep apnea)    cpap   Other and unspecified hyperlipidemia    Permanent atrial fibrillation ([place])    Zio Monitor 5/22: AFib - Avg HR 55; slowest 32; longest pause 3.9 sec; occ PVCs, 4 beat NSVT   Sleep apnea     Past Surgical History:  Procedure Laterality Date   COLONOSCOPY  06/09/2015   per Dr. Fuller Plan, adenomatous polyp, repeat in 7 yrs   FOOT SURGERY  at age 51   LUMBAR LAMINECTOMY/DECOMPRESSION MICRODISCECTOMY N/A 11/21/2012   Procedure: Lumbar Three to Lumbar Five Laminectomy;  Surgeon: Floyce Stakes, MD;  Location: [facility];  Service: Neurosurgery;  Laterality: N/A;  Lumbar Three to Lumbar Five Laminectomy     Home Medications:  Prior to Admission medications   Medication Sig Start Date End Date Taking? Authorizing Provider  acetaminophen (TYLENOL) 325 MG tablet Take 650 mg by mouth daily as needed (pain).   Yes [provider]  amLODipine (NORVASC) 10 MG tablet Take 1 tablet (10 mg total) by mouth daily. 07/15/21  Yes [name], MD  carvedilol (COREG) 12.5 MG tablet TAKE 1 AND 1/2 TABLET BY MOUTH TWO TIMES A DAY Patient taking differently: Take 18.75 mg by mouth 2 (two) times daily with a meal. 01/26/21  Yes Fay Records, MD  cloNIDine (CATAPRES) 0.2 MG tablet Take 1 tablet (0.2 mg total) by mouth 2 (two) times daily. 07/15/21  Yes [name], MD  doxazosin (CARDURA) 4 MG tablet Take 1 tablet (4 mg total) by mouth daily. 07/15/21  Yes [name], MD  ezetimibe (ZETIA) 10 MG tablet Take 1 tablet (10 mg total) by mouth daily. 07/15/21  Yes [name], MD  hydrochlorothiazide (MICROZIDE) 12.5 MG capsule Take 1 capsule (12.5 mg total) by mouth daily. Patient taking differently: Take 12.5 mg by mouth every evening. 07/15/21  Yes [name], MD   losartan (COZAAR) 100 MG tablet Take 1 tablet (100 mg total) by mouth daily. 07/15/21  Yes [name], MD  lubiprostone (AMITIZA) 24 MCG capsule Take 1 capsule (24 mcg total) by mouth 2 (two) times daily. 07/15/21  Yes [name], MD  meloxicam (MOBIC) 15 MG tablet Take 1 tablet (15 mg total) by mouth daily. 07/15/21  Yes [name], MD  OMEGA-3 FATTY ACIDS PO Take 1 capsule by mouth in the morning and at bedtime.   Yes [provider]  warfarin (COUMADIN) 5 MG tablet TAKE 1 TO 1 AND 1/2 TABLETS BY MOUTH DAILY AS DIRECTED BY COUMADIN CLINIC Patient taking differently: Take 7.5 mg by mouth every evening. 08/05/21  Yes Fay Records, MD  Iron, Ferrous Sulfate, 325 (65 Fe) MG TABS Take 325 mg by mouth daily. Patient not taking: Reported on 12/15/2021 07/16/21   [name], MD  ketoconazole (NIZORAL) 2 % cream Apply 1 application topically 2 (two) times daily as needed for irritation. Patient not taking: Reported on 12/15/2021 06/29/20   Sarajane Jews,  Ishmael Holter, MD    Inpatient Medications: Scheduled Meds:  amLODipine  10 mg Oral Daily   carvedilol  12.5 mg Oral BID WC   cloNIDine  0.2 mg Oral BID   cyanocobalamin  1,000 mcg Intramuscular Q1500   doxazosin  4 mg Oral Daily   doxycycline  100 mg Oral Q12H   hydrochlorothiazide  12.5 mg Oral Daily   losartan  100 mg Oral Daily   lubiprostone  24 mcg Oral BID WC   sodium chloride flush  3 mL Intravenous Q12H   Continuous Infusions:  PRN Meds: acetaminophen **OR** acetaminophen, liver oil-zinc oxide, ondansetron **OR** ondansetron (ZOFRAN) IV, senna-docusate  Allergies:    Allergies  Allergen Reactions   Statins Other (See Comments)    "Muscles were messed up"    Penicillins Other (See Comments)    Childhood reaction. Unknown per Pt, cannot remember ever trying beta lactams     Social History:   Social History   Socioeconomic History   Marital status: Single    Spouse name: Not on file   Number of children: 0   Years  of education: Not on file   Highest education level: Not on file  Occupational History   Occupation: retired  Tobacco Use   Smoking status: Never   Smokeless tobacco: Never  Vaping Use   Vaping Use: Never used  Substance and Sexual Activity   Alcohol use: No    Alcohol/week: 0.0 standard drinks of alcohol   Drug use: No   Sexual activity: Not on file  Other Topics Concern   Not on file  Social History Narrative   Not on file   Social Determinants of Health   Financial Resource Strain: Low Risk  (03/18/2021)   Overall Financial Resource Strain (CARDIA)    Difficulty of Paying Living Expenses: Not hard at all  Food Insecurity: No Food Insecurity (03/18/2021)   Hunger Vital Sign    Worried About Running Out of Food in the Last Year: Never true    [place] in the Last Year: Never true  Transportation Needs: No Transportation Needs (03/18/2021)   PRAPARE - Hydrologist (Medical): No    Lack of Transportation (Non-Medical): No  Physical Activity: Inactive (03/18/2021)   Exercise Vital Sign    Days of Exercise per Week: 0 days    Minutes of Exercise per Session: 0 min  Stress: No Stress Concern Present (03/18/2021)   [place]    Feeling of Stress : Not at all  Social Connections: Socially Isolated (03/18/2021)   Social Connection and Isolation Panel [NHANES]    Frequency of Communication with Friends and Family: Three times a week    Frequency of Social Gatherings with Friends and Family: Three times a week    Attends Religious Services: Never    Active Member of Clubs or Organizations: No    Attends Archivist Meetings: Never    Marital Status: Never married  Intimate Partner Violence: Not At Risk (03/18/2021)   Humiliation, Afraid, Rape, and Kick questionnaire    Fear of Current or Ex-Partner: No    Emotionally Abused: No    Physically Abused: No    Sexually Abused:  No    Family History:    Family History  Problem Relation Age of Onset   Alzheimer's disease Mother    Stroke Father    Heart disease Father    Hypertension Father  Alzheimer's disease Maternal Aunt        x 3   Colon cancer Neg Hx    Hypercalcemia Neg Hx      ROS:  Please see the history of present illness.   All other ROS reviewed and negative.     Physical Exam/Data:   Vitals:   12/15/21 1230 12/15/21 1329 12/15/21 1445 12/15/21 1459  BP: 110/67 125/71 130/78   Pulse: (!) 57 (!) 52 66   Resp: (!) 32 (!) 30 15   Temp:  97.8 F (36.6 C)  98.3 F (36.8 C)  TempSrc:  Oral  Oral  SpO2: 96% 95% 97%    No intake or output data in the 24 hours ending 12/15/21 1706    10/27/2021    9:39 AM 07/15/2021    8:09 AM 03/18/2021    3:18 PM  Last 3 Weights  Weight (lbs) 263 lb 271 lb 267 lb  Weight (kg) 119.296 kg 122.925 kg 121.11 kg     There is no height or weight on file to calculate BMI.  General: Obese older male, no distress HEENT: normal Neck: no JVD Vascular: No carotid bruits; Distal pulses 2+ bilaterally Cardiac:  normal S1, S2; irregularly irregular; + 3/4 systolic murmur  Lungs:  clear to auscultation bilaterally, no wheezing, rhonchi or rales  Abd: soft, nontender, no hepatomegaly  Ext: no edema, right LE weeping Musculoskeletal:  No deformities, BUE and BLE strength normal and equal Skin: warm and dry  Neuro:  CNs 2-12 intact, no focal abnormalities noted Psych:  Normal affect   EKG:  The EKG was personally reviewed and demonstrates: atrial fibrillation, 80 bpm, left bundle branch block (old).  Relevant CV Studies:  Echo: 10/2021  IMPRESSIONS     1. The aortic valve is calcified. There is moderate calcification of the  aortic valve. There is severe thickening of the aortic valve. Aortic valve  regurgitation is trivial. Moderate aortic valve stenosis based on mean  gradient and Vmax (highest mean  gradient at RUSB). AVA and DI suggest more  moderate-to-severe AS. Aortic  valve mean gradient measures 28.8 mmHg. Aortic valve Vmax measures 3.50  m/s. AVA by continuity 1.0cm2, DI 0.25   2. Left ventricular ejection fraction, by estimation, is 60 to 65%. The  left ventricle has normal function. The left ventricle has no regional  wall motion abnormalities. Diastolic function indeterminant due to AFib.  Elevated left atrial pressure.   3. Right ventricular systolic function is normal. The right ventricular  size is normal. Tricuspid regurgitation signal is inadequate for assessing  PA pressure.   4. Left atrial size was severely dilated.   5. A small pericardial effusion is present. The pericardial effusion is  circumferential. There is no evidence of cardiac tamponade.   6. Difficult to qualify degree of MR due to eccentric jet with coanda  effect. Suspect mild-to-moderate MR. The mitral valve is grossly normal.  Moderate mitral annular calcification.   7. Aortic dilatation noted. There is borderline dilatation of the aortic  root, measuring 39 mm. There is borderline dilatation of the ascending  aorta, measuring 38 mm.   8. The inferior vena cava is normal in size with greater than 50%  respiratory variability, suggesting right atrial pressure of 3 mmHg.   9. Right atrial size was mildly dilated.   Comparison(s): Compared to prior TTE on 11/2020, the AVA mean gradient is  higher at 81mHg but was incompletely captured on prior TTE. AS appears  moderate vs  moderate to severe.   FINDINGS   Left Ventricle: Left ventricular ejection fraction, by estimation, is 60  to 65%. The left ventricle has normal function. The left ventricle has no  regional wall motion abnormalities. The left ventricular internal cavity  size was normal in size. There is   no left ventricular hypertrophy. Diastolic function indeterminant due to  AFib. Elevated left atrial pressure.   Right Ventricle: The right ventricular size is normal. No increase in   right ventricular wall thickness. Right ventricular systolic function is  normal. Tricuspid regurgitation signal is inadequate for assessing PA  pressure.   Left Atrium: Left atrial size was severely dilated.   Right Atrium: Right atrial size was mildly dilated.   Pericardium: A small pericardial effusion is present. The pericardial  effusion is circumferential. There is no evidence of cardiac tamponade.   Mitral Valve: Difficult to qualify degree of MR due to eccentric jet with  coanda effect. Suspect mild-to-moderate MR. The mitral valve is grossly  normal. Moderate mitral annular calcification. MV peak gradient, 12.7  mmHg. The mean mitral valve gradient  is 4.0 mmHg.   Tricuspid Valve: The tricuspid valve is normal in structure. Tricuspid  valve regurgitation is trivial.   Aortic Valve: AVA 1.0cm2, DI 0.25. The aortic valve is calcified. There is  moderate calcification of the aortic valve. There is severe thickening of  the aortic valve. Aortic valve regurgitation is trivial. Aortic  regurgitation PHT measures 541 msec.  Moderate aortic stenosis is present. Aortic valve mean gradient measures  28.8 mmHg. Aortic valve peak gradient measures 49.0 mmHg. Aortic valve  area, by VTI measures 1.01 cm.   Pulmonic Valve: The pulmonic valve was normal in structure. Pulmonic valve  regurgitation is trivial.   Aorta: Aortic dilatation noted. There is borderline dilatation of the  aortic root, measuring 39 mm. There is borderline dilatation of the  ascending aorta, measuring 38 mm.   Venous: The inferior vena cava is normal in size with greater than 50%  respiratory variability, suggesting right atrial pressure of 3 mmHg.   IAS/Shunts: The atrial septum is grossly normal.   Echo: 12/2021  IMPRESSIONS     1. Left ventricular ejection fraction, by estimation, is 55 to 60%. The  left ventricle has normal function. The left ventricle has no regional  wall motion abnormalities.  Left ventricular diastolic parameters are  indeterminate.   2. Right ventricular systolic function is normal. The right ventricular  size is normal. Tricuspid regurgitation signal is inadequate for assessing  PA pressure.   3. Left atrial size was moderately dilated.   4. The mitral valve was not well visualized. Trivial mitral valve  regurgitation (MR was reported as worse on last echo but mitral valve  poorly visualized on this echo). No evidence of mitral stenosis.   5. The aortic valve is tricuspid. There is moderate calcification of the  aortic valve. Aortic valve regurgitation is mild. Moderate aortic valve  stenosis. Aortic valve mean gradient measures 25.0 mmHg. Calculated AVA  1.5 cm^2.   6. Aortic dilatation noted. There is mild dilatation of the aortic root  and of the ascending aorta, measuring 40 mm.   7. The inferior vena cava is dilated in size with >50% respiratory  variability, suggesting right atrial pressure of 8 mmHg.   8. The patient was in atrial fibrillation.   FINDINGS   Left Ventricle: Left ventricular ejection fraction, by estimation, is 55  to 60%. The left ventricle has normal function. The  left ventricle has no  regional wall motion abnormalities. The left ventricular internal cavity  size was normal in size. There is   no left ventricular hypertrophy. Left ventricular diastolic parameters  are indeterminate.   Right Ventricle: The right ventricular size is normal. No increase in  right ventricular wall thickness. Right ventricular systolic function is  normal. Tricuspid regurgitation signal is inadequate for assessing PA  pressure.   Left Atrium: Left atrial size was moderately dilated.   Right Atrium: Right atrial size was normal in size.   Pericardium: Trivial pericardial effusion is present.   Mitral Valve: The mitral valve was not well visualized. Trivial mitral  valve regurgitation. No evidence of mitral valve stenosis. MV peak  gradient, 5.6  mmHg. The mean mitral valve gradient is 2.5 mmHg.   Tricuspid Valve: The tricuspid valve is normal in structure. Tricuspid  valve regurgitation is not demonstrated.   Aortic Valve: The aortic valve is tricuspid. There is moderate  calcification of the aortic valve. Aortic valve regurgitation is mild.  Moderate aortic stenosis is present. Aortic valve mean gradient measures  25.0 mmHg. Aortic valve peak gradient measures  36.4 mmHg. Aortic valve area, by VTI measures 1.97 cm.   Pulmonic Valve: The pulmonic valve was normal in structure. Pulmonic valve  regurgitation is not visualized.   Aorta: Aortic dilatation noted. There is mild dilatation of the aortic  root and of the ascending aorta, measuring 40 mm.   Venous: The inferior vena cava is dilated in size with greater than 50%  respiratory variability, suggesting right atrial pressure of 8 mmHg.   IAS/Shunts: No atrial level shunt detected by color flow Doppler.   Laboratory Data:  High Sensitivity Troponin:   Recent Labs  Lab 12/14/21 2140 12/14/21 2343 12/15/21 0420 12/15/21 1330  TROPONINIHS 21* 51* 196* 334*     Chemistry Recent Labs  Lab 12/14/21 2140 12/15/21 0420  NA 137 134*  K 4.0 3.7  CL 103 102  CO2 25 22  GLUCOSE 147* 126*  BUN 20 17  CREATININE 0.86 0.81  CALCIUM 10.6* 10.4*  GFRNONAA >60 >60  ANIONGAP 9 10    Recent Labs  Lab 12/14/21 2140  PROT 6.5  ALBUMIN 3.4*  AST 22  ALT 20  ALKPHOS 82  BILITOT 0.9   Lipids No results for input(s): "CHOL", "TRIG", "HDL", "LABVLDL", "LDLCALC", "CHOLHDL" in the last 168 hours.  Hematology Recent Labs  Lab 12/14/21 2140 12/15/21 0420  WBC 15.9* 13.8*  RBC 4.16* 4.02*  HGB 12.8* 12.4*  HCT 38.3* 36.8*  MCV 92.1 91.5  MCH 30.8 30.8  MCHC 33.4 33.7  RDW 14.4 14.5  PLT 186 173   Thyroid  Recent Labs  Lab 12/15/21 0420  TSH 0.579    BNPNo results for input(s): "BNP", "PROBNP" in the last 168 hours.  DDimer No results for input(s): "DDIMER"  in the last 168 hours.   Radiology/Studies:  ECHOCARDIOGRAM COMPLETE  Result Date: 12/15/2021    ECHOCARDIOGRAM REPORT   Patient Name:   DYRELL TUCCILLO Date of Exam: 12/15/2021 Medical Rec #:  [id number]    Height:       70.0 in Accession #:    [id number]   Weight:       263.0 lb Date of Birth:  07-Mar-1951    BSA:          2.345 m Patient Age:    23 years     BP:  156/73 mmHg Patient Gender: M            HR:           74 bpm. Exam Location:  Inpatient Procedure: 2D Echo, Cardiac Doppler, Color Doppler and Intracardiac            Opacification Agent Indications:    Chest pain, elevated troponin, aortic stenosis  History:        Patient has prior history of Echocardiogram examinations. Aortic                 Valve Disease and Mitral Valve Disease, Arrythmias:Atrial                 Fibrillation and Bradycardia; Risk Factors:Family History of                 Coronary Artery Disease, Hypertension, Dyslipidemia, Sleep Apnea                 and Obesity.  Sonographer:    Eartha Inch Referring Phys: [id number] TIMOTHY S OPYD  Sonographer Comments: Technically difficult study due to poor echo windows. Image acquisition challenging due to patient body habitus and Image acquisition challenging due to respiratory motion. IMPRESSIONS  1. Left ventricular ejection fraction, by estimation, is 55 to 60%. The left ventricle has normal function. The left ventricle has no regional wall motion abnormalities. Left ventricular diastolic parameters are indeterminate.  2. Right ventricular systolic function is normal. The right ventricular size is normal. Tricuspid regurgitation signal is inadequate for assessing PA pressure.  3. Left atrial size was moderately dilated.  4. The mitral valve was not well visualized. Trivial mitral valve regurgitation (MR was reported as worse on last echo but mitral valve poorly visualized on this echo). No evidence of mitral stenosis.  5. The aortic valve is tricuspid. There is moderate  calcification of the aortic valve. Aortic valve regurgitation is mild. Moderate aortic valve stenosis. Aortic valve mean gradient measures 25.0 mmHg. Calculated AVA 1.5 cm^2.  6. Aortic dilatation noted. There is mild dilatation of the aortic root and of the ascending aorta, measuring 40 mm.  7. The inferior vena cava is dilated in size with >50% respiratory variability, suggesting right atrial pressure of 8 mmHg.  8. The patient was in atrial fibrillation. FINDINGS  Left Ventricle: Left ventricular ejection fraction, by estimation, is 55 to 60%. The left ventricle has normal function. The left ventricle has no regional wall motion abnormalities. The left ventricular internal cavity size was normal in size. There is  no left ventricular hypertrophy. Left ventricular diastolic parameters are indeterminate. Right Ventricle: The right ventricular size is normal. No increase in right ventricular wall thickness. Right ventricular systolic function is normal. Tricuspid regurgitation signal is inadequate for assessing PA pressure. Left Atrium: Left atrial size was moderately dilated. Right Atrium: Right atrial size was normal in size. Pericardium: Trivial pericardial effusion is present. Mitral Valve: The mitral valve was not well visualized. Trivial mitral valve regurgitation. No evidence of mitral valve stenosis. MV peak gradient, 5.6 mmHg. The mean mitral valve gradient is 2.5 mmHg. Tricuspid Valve: The tricuspid valve is normal in structure. Tricuspid valve regurgitation is not demonstrated. Aortic Valve: The aortic valve is tricuspid. There is moderate calcification of the aortic valve. Aortic valve regurgitation is mild. Moderate aortic stenosis is present. Aortic valve mean gradient measures 25.0 mmHg. Aortic valve peak gradient measures 36.4 mmHg. Aortic valve area, by VTI measures 1.97 cm. Pulmonic Valve: The pulmonic valve was normal  in structure. Pulmonic valve regurgitation is not visualized. Aorta: Aortic  dilatation noted. There is mild dilatation of the aortic root and of the ascending aorta, measuring 40 mm. Venous: The inferior vena cava is dilated in size with greater than 50% respiratory variability, suggesting right atrial pressure of 8 mmHg. IAS/Shunts: No atrial level shunt detected by color flow Doppler.  LEFT VENTRICLE PLAX 2D LVIDd:         4.70 cm   Diastology LVIDs:         3.00 cm   LV e' medial:    7.07 cm/s LV PW:         1.00 cm   LV E/e' medial:  18.7 LV IVS:        1.00 cm   LV e' lateral:   10.70 cm/s LVOT diam:     2.30 cm   LV E/e' lateral: 12.3 LV SV:         110 LV SV Index:   47 LVOT Area:     4.15 cm  RIGHT VENTRICLE             IVC RV S prime:     13.40 cm/s  IVC diam: 2.30 cm TAPSE (M-mode): 1.8 cm LEFT ATRIUM              Index        RIGHT ATRIUM           Index LA diam:        5.60 cm  2.39 cm/m   RA Area:     15.60 cm LA Vol (A2C):   102.0 ml 43.50 ml/m  RA Volume:   37.90 ml  16.16 ml/m LA Vol (A4C):   108.0 ml 46.06 ml/m LA Biplane Vol: 107.0 ml 45.63 ml/m  AORTIC VALVE AV Area (Vmax):    1.76 cm AV Area (Vmean):   1.96 cm AV Area (VTI):     1.97 cm AV Vmax:           301.60 cm/s AV Vmean:          194.667 cm/s AV VTI:            0.557 m AV Peak Grad:      36.4 mmHg AV Mean Grad:      25.0 mmHg LVOT Vmax:         128.00 cm/s LVOT Vmean:        91.800 cm/s LVOT VTI:          0.264 m LVOT/AV VTI ratio: 0.47  AORTA Ao Root diam: 3.90 cm Ao Asc diam:  4.00 cm MITRAL VALVE MV Area (PHT): 4.01 cm     SHUNTS MV Area VTI:   3.71 cm     Systemic VTI:  0.26 m MV Peak grad:  5.6 mmHg     Systemic Diam: 2.30 cm MV Mean grad:  2.5 mmHg MV Vmax:       1.18 m/s MV Vmean:      73.3 cm/s MV Decel Time: 189 msec MV E velocity: 132.00 cm/s Dalton McleanMD Electronically signed by Franki Monte Signature Date/Time: 12/15/2021/1:49:55 PM    Final    CT CHEST ABDOMEN PELVIS W CONTRAST  Result Date: 12/15/2021 CLINICAL DATA:  Aortic aneurysm, known or suspected. Generalized weakness.  EXAM: CT CHEST, ABDOMEN, AND PELVIS WITH CONTRAST TECHNIQUE: Multidetector CT imaging of the chest, abdomen and pelvis was performed following the standard protocol during bolus administration of intravenous contrast. RADIATION DOSE REDUCTION: This  exam was performed according to the departmental dose-optimization program which includes automated exposure control, adjustment of the mA and/or kV according to patient size and/or use of iterative reconstruction technique. CONTRAST:  68m OMNIPAQUE IOHEXOL 350 MG/ML SOLN COMPARISON:  None Available. FINDINGS: CT CHEST FINDINGS Cardiovascular: Mild global cardiomegaly. Extensive multi-vessel coronary artery calcification. Aortic valvular calcifications noted. Small pericardial effusion without definite CT evidence of cardiac tamponade. The central pulmonary arteries are enlarged in keeping with changes of pulmonary arterial hypertension. Mild atherosclerotic calcification within the thoracic aorta. No aortic aneurysm. Mediastinum/Nodes: No enlarged mediastinal, hilar, or axillary lymph nodes. Thyroid gland, trachea, and esophagus demonstrate no significant findings. Lungs/Pleura: Mild bibasilar atelectasis. Lungs are otherwise clear. No pneumothorax or pleural effusion. No central obstructing lesion. Musculoskeletal: Osseous structures are age-appropriate. No acute bone abnormality. No lytic or blastic bone lesion. CT ABDOMEN PELVIS FINDINGS Hepatobiliary: No focal liver abnormality is seen. No gallstones, gallbladder wall thickening, or biliary dilatation. Pancreas: Unremarkable Spleen: Multiple hypoenhancing lesions are seen within the spleen which are indeterminate on this single phase examination but may represent small cysts or hemangioma in a patient without a history of malignancy. The spleen is not enlarged. Adrenals/Urinary Tract: The adrenal glands are unremarkable. The kidneys are normal in size and position. Bilateral nonobstructing nephrolithiasis is present  with scattered calculi bilaterally measuring up to 3 mm. There is an additional nonobstructing 15 mm calculus within the left renal pelvis. No hydronephrosis. No ureteral calculi. No perinephric inflammatory stranding or fluid collections are identified. The bladder is unremarkable. Stomach/Bowel: Moderate colonic stool burden without evidence of obstruction. The stomach, small bowel, and large bowel are otherwise unremarkable. Appendix normal. No free intraperitoneal gas or fluid. Vascular/Lymphatic: Aortic atherosclerosis. No aortic aneurysm identified. No enlarged abdominal or pelvic lymph nodes. Reproductive: Prostate is unremarkable. Other: Infiltration within the subcutaneous fat of the inferior pannus with associated dermal thickening is partially visualized on this examination. This is nonspecific and may be chronic in nature, however, acute inflammatory conditions such as cellulitis could appear in this fashion. No abdominal wall hernia. Musculoskeletal: Advanced degenerative changes are seen asymmetrically within the right hip right L5 laminotomy has been performed. Advanced degenerative changes are seen throughout the thoracolumbar spine with grade 1 anterolisthesis of L4-L5. No acute bone abnormality. No lytic or blastic bone lesion. IMPRESSION: 1. No acute intrathoracic or intra-abdominal pathology identified. No aortic aneurysm. 2. Extensive multi-vessel coronary artery calcification. 3. Aortic valvular calcifications noted. Echocardiography may be helpful to assess the degree of valvular dysfunction. 4. Mild global cardiomegaly. Small pericardial effusion. 5. Morphologic changes in keeping with pulmonary arterial hypertension. 6. Bilateral nonobstructing nephrolithiasis. No hydronephrosis. 7. Moderate colonic stool burden without evidence of obstruction. 8. Infiltration within the subcutaneous fat of the inferior pannus with associated dermal thickening. This may be chronic in nature, however, acute  inflammatory conditions such as cellulitis could appear in this fashion. Correlation with clinical examination is recommended. Aortic Atherosclerosis (ICD10-I70.0). Electronically Signed   By: AFidela SalisburyM.D.   On: 12/15/2021 00:35   DG Chest 1 View  Result Date: 12/14/2021 CLINICAL DATA:  Weakness starting today. EXAM: CHEST  1 VIEW COMPARISON:  None 914 FINDINGS: Shallow inspiration. Cardiac enlargement. No vascular congestion, edema, or consolidation. No pleural effusions. No pneumothorax. Mediastinal contours appear intact. Calcification of the aorta. Degenerative changes in the spine and shoulders. IMPRESSION: Cardiac enlargement.  No evidence of active pulmonary disease. Electronically Signed   By: WLucienne CapersM.D.   On: 12/14/2021 22:24     Assessment  and Plan:   [name] is a 71 y.o. male with a hx of permanent afib, bradycardia, mild MR, mild aortic stenosis, hypertension, hyperlipidemia, OSA, obesity who is being seen 12/15/2021 for the evaluation of elevated troponin at the request of Dr. Lonny Prude.  Weakness Elevated troponin Coronary calcifications on CT -- Presented with generalized weakness, denies any anginal symptoms but is limited in his activity at baseline.  High-sensitivity troponin 51>> 196>> 334.  CT chest abdomen pelvis with extensive coronary calcifications reported.  Given his valvular disease and calcifications reported on CT discussed need for further evaluation with cardiac catheterization.  He has been hesitant to proceed with procedures in the past. Dr. Harrington Challenger to see to further discuss -- Hold Coumadin, plan IV heparin when INR less than 2 -- Continue Coreg, Zetia  Permanent atrial fibrillation -- Remains rate controlled on Coreg 12.5 mg twice daily, INR 3.2 on admission -- As above hold Coumadin with plans to start IV heparin when INR less than 2 -- per discussion with MD will give 2.'5mg'$  vitamin K+  Valvular heart disease Mitral regurgitation Aortic  stenosis -- Mitral regurgitation read as mild to moderate in the past but difficult acoustic windows, mitral valve was poorly visualized on echo this admission 10/11. -- Moderate aortic valve stenosis with mean gradient of 25 mmHg and calculated AVA 1.5 cm^2 -- He has been hesitant to undergo TEE to further define anatomy during prior office visit -- Ideally undergo right and left heart catheterization, though he is on the fence about proceeding. Dr. Harrington Challenger to follow up  Hypertension -- Mildly elevated while in the ED -- Continue carvedilol 12.5 mg twice daily, amlodipine 10 mg daily, clonidine 0.2 mg twice daily, doxazosin 4 mg daily, losartan 100 mg  Hyperlipidemia -- Multiple statin intolerances -- Has been tolerating Zetia, did discuss options of PCSK9i though he reports he is not interested in therapy  Abdominal wall dermatitis -- Management per primary  Risk Assessment/Risk Scores:    CHA2DS2-VASc Score = 2  } This indicates a 2.2% annual risk of stroke. The patient's score is based upon: CHF History: 0 HTN History: 1 Diabetes History: 0 Stroke History: 0 Vascular Disease History: 0 Age Score: 1 Gender Score: 0   For questions or updates, please contact [place] Please consult [email or web address] for contact info under    Signed, Reino Bellis, NP  12/15/2021 5:06 PM

## 2021-12-16 DIAGNOSIS — R7989 Other specified abnormal findings of blood chemistry: Secondary | ICD-10-CM | POA: Diagnosis not present

## 2021-12-16 DIAGNOSIS — G4733 Obstructive sleep apnea (adult) (pediatric): Secondary | ICD-10-CM

## 2021-12-16 DIAGNOSIS — I350 Nonrheumatic aortic (valve) stenosis: Secondary | ICD-10-CM | POA: Diagnosis not present

## 2021-12-16 DIAGNOSIS — L03311 Cellulitis of abdominal wall: Secondary | ICD-10-CM | POA: Diagnosis not present

## 2021-12-16 DIAGNOSIS — R531 Weakness: Secondary | ICD-10-CM | POA: Diagnosis not present

## 2021-12-16 LAB — CBC
HCT: 33.6 % — ABNORMAL LOW (ref 39.0–52.0)
Hemoglobin: 11.9 g/dL — ABNORMAL LOW (ref 13.0–17.0)
MCH: 31.6 pg (ref 26.0–34.0)
MCHC: 35.4 g/dL (ref 30.0–36.0)
MCV: 89.4 fL (ref 80.0–100.0)
Platelets: 163 10*3/uL (ref 150–400)
RBC: 3.76 MIL/uL — ABNORMAL LOW (ref 4.22–5.81)
RDW: 14.4 % (ref 11.5–15.5)
WBC: 11.3 10*3/uL — ABNORMAL HIGH (ref 4.0–10.5)
nRBC: 0 % (ref 0.0–0.2)

## 2021-12-16 LAB — TROPONIN I (HIGH SENSITIVITY): Troponin I (High Sensitivity): 204 ng/L (ref <18)

## 2021-12-16 LAB — URINE CULTURE: Culture: NO GROWTH

## 2021-12-16 LAB — BASIC METABOLIC PANEL
Anion gap: 9 (ref 5–15)
BUN: 18 mg/dL (ref 8–23)
CO2: 24 mmol/L (ref 22–32)
Calcium: 10.6 mg/dL — ABNORMAL HIGH (ref 8.9–10.3)
Chloride: 104 mmol/L (ref 98–111)
Creatinine, Ser: 0.96 mg/dL (ref 0.61–1.24)
GFR, Estimated: >60 mL/min (ref >60)
Glucose, Bld: 109 mg/dL — ABNORMAL HIGH (ref 70–99)
Potassium: 3.6 mmol/L (ref 3.5–5.1)
Sodium: 137 mmol/L (ref 135–145)

## 2021-12-16 LAB — PROTIME-INR
INR: 3.4 — ABNORMAL HIGH (ref 0.8–1.2)
INR: 1.8 — ABNORMAL HIGH (ref 0.8–1.2)
Prothrombin Time: 34.1 seconds — ABNORMAL HIGH (ref 11.4–15.2)
Prothrombin Time: 20.7 seconds — ABNORMAL HIGH (ref 11.4–15.2)

## 2021-12-16 MED ORDER — FOLIC ACID 1 MG PO TABS
1.0000 mg | ORAL_TABLET | Freq: Every day | ORAL | Status: DC
  Administered 2021-12-16 – 2021-12-22 (×7): 1 mg via ORAL
  Filled 2021-12-16 (×7): qty 1

## 2021-12-16 MED ORDER — VITAMIN K1 10 MG/ML IJ SOLN
5.0000 mg | Freq: Once | INTRAVENOUS | Status: AC
  Administered 2021-12-16: 5 mg via INTRAVENOUS
  Filled 2021-12-16: qty 0.5

## 2021-12-16 MED ORDER — ENOXAPARIN SODIUM 120 MG/0.8ML IJ SOSY
120.0000 mg | PREFILLED_SYRINGE | Freq: Once | INTRAMUSCULAR | Status: AC
  Administered 2021-12-16: 120 mg via SUBCUTANEOUS
  Filled 2021-12-16 (×2): qty 0.8

## 2021-12-16 NOTE — Progress Notes (Signed)
Mobility Specialist Progress Note:   12/16/21 1300  Mobility  Activity Transferred to/from [facility]  Level of Assistance Minimal assist, patient does 75% or more (+2)  Assistive Device Front wheel walker  Distance Ambulated (ft) 4 ft  Activity Response Tolerated well  $Mobility charge 1 Mobility   Pt on BSC and NT needing help getting him back to bed. MinA+2 to stand and step to bed. Left in bed with call bell in reach and all needs met.   [facility] Surveyor, mining Chat only

## 2021-12-16 NOTE — Progress Notes (Signed)
Pt states he does not need the CPAP. Will call if he wants one.

## 2021-12-16 NOTE — TOC Initial Note (Signed)
Transition of Care [facility]) - Initial/Assessment Note    Patient Details  Name: [name] MRN: [id number] Date of Birth: 1950/04/21  Transition of Care [facility]) CM/SW Contact:    Verdell Carmine, RN Phone Number: 12/16/2021, 3:52 PM  Clinical Narrative:                  Patient presents with weakness, elevated troponins. He lives alone in an apartment. He is amennable to [facility] if needed or recommended. He would like an agency that works well with his insurance. He is not interested in SNF, would prefer to go home.at this time.  TOC will follow for recommendations and further needs. Expected Discharge Plan: [place] Barriers to Discharge: Continued Medical Work up   Patient Goals and CMS Choice Patient states their goals for this hospitalization and ongoing recovery are:: return home      Expected Discharge Plan and Services Expected Discharge Plan: [place]       Living arrangements for the past 2 months: Apartment                                      Prior Living Arrangements/Services Living arrangements for the past 2 months: Apartment Lives with:: Self              Current home services: DME    Activities of Daily Living Home Assistive Devices/Equipment: Environmental consultant (specify type) ADL Screening (condition at time of admission) Patient's cognitive ability adequate to safely complete daily activities?: Yes Is the patient deaf or have difficulty hearing?: No Does the patient have difficulty seeing, even when wearing glasses/contacts?: No Does the patient have difficulty concentrating, remembering, or making decisions?: No Patient able to express need for assistance with ADLs?: Yes Does the patient have difficulty dressing or bathing?: Yes Independently performs ADLs?: Yes (appropriate for developmental age) Does the patient have difficulty walking or climbing stairs?: Yes Weakness of Legs: Both Weakness of  Arms/Hands: None  Permission Sought/Granted                  Emotional Assessment   Attitude/Demeanor/Rapport: Self-Confident Affect (typically observed): Stable Orientation: : Oriented to Self, Oriented to Place, Oriented to  Time, Oriented to Situation Alcohol / Substance Use: Not Applicable Psych Involvement: No (comment)  Admission diagnosis:  Cellulitis of abdominal wall [L03.311] Elevated troponin [R79.89] Generalized weakness [R53.1] Patient Active Problem List   Diagnosis Date Noted   Elevated troponin 12/15/2021   Cellulitis of abdominal wall 12/15/2021   Generalized weakness 12/15/2021   Stasis ulcer ([place]) 07/15/2021   Nonrheumatic mitral valve regurgitation 01/14/2021   Nonrheumatic aortic valve stenosis 01/14/2021   Bradycardia 01/14/2021   Hypercalcemia 09/26/2020   Permanent atrial fibrillation ([place])    Prediabetes 07/21/2016   Myalgia and myositis 10/17/2013   Right hip pain 10/17/2013   Unspecified vitamin D deficiency 10/11/2013   Encounter for therapeutic drug monitoring 06/03/2013   OTHER CONSTIPATION 01/20/2010   PERSONAL HX COLONIC POLYPS 01/20/2010   OBSTRUCTIVE SLEEP APNEA 11/17/2008   ATRIAL FIBRILLATION 08/18/2008   Dyslipidemia 06/18/2007   OBESITY 06/18/2007   INTERNAL HEMORRHOIDS 06/18/2007   EXTERNAL HEMORRHOIDS 06/18/2007   Resistant hypertension 09/04/2006   MITRAL REGURGITATION, MODERATE 09/04/2006   RENAL CALCULUS, HX OF 09/04/2006   ADENOMATOUS COLONIC POLYP 07/29/2003   PCP:  [name], MD Pharmacy:   [facility]  [id number] [place] [address] Phone: [phone] Fax: [phone]     Social Determinants of Health (SDOH) Interventions    Readmission Risk Interventions     No data to display

## 2021-12-16 NOTE — Hospital Course (Addendum)
[name] is a 71 y.o. male wit a history of hypertension, aortic stenosis and atrial fibrillation on Coumadin who presented secondary weakness and dyspnea on exertion. Associated elevated troponin. Cardiology consulted. L/RHC performed on 10/13 which was significant for severe disease and recommendation for medical management.

## 2021-12-16 NOTE — Progress Notes (Signed)
PT Cancellation Note  Patient Details Name: WINSLOW EDERER MRN: [id number] DOB: [birth year]   Cancelled Treatment:    Reason Eval/Treat Not Completed: Patient not medically ready (pt with continued rise in troponin and await cardiology visit this am prior to proceeding with mobility)   Lacey Wallman B Jeter Tomey 12/16/2021, 8:40 AM Bayard Males, [place] Office: [phone]

## 2021-12-16 NOTE — Progress Notes (Signed)
PROGRESS NOTE    [name]  ONG:[id number] DOB: October 22, 1950 DOA: 12/14/2021 PCP: [name], MD   Brief Narrative: [name] is a 71 y.o. male wit a history of hypertension, aortic stenosis and atrial fibrillation on Coumadin who presented secondary weakness and dyspnea on exertion. Associated elevated troponin. Cardiology consulted.   Assessment and Plan:  Generalized weakness Dyspnea on exertion Nonspecific. Possibly related to bradycardia vs ACS vs infection vs aortic stenosis. Troponin elevated to 196 this morning. No symptoms at rest. Heart rate as low as 40s while on Coreg. EKG without strong evidence for ACS. -Cardiology consult for bradycardia and elevated troponin and plan LHC   Elevated troponin Hs Troponin trend of 21 > 51 > 196 > 334 > 204. No chest pain since admission but continues to have exertional dyspnea. Cardiology consulted as mentioned above and are recommending a LHC. Coumadin held.   Abdominal wall dermatitis Distribution seems more consistent with intertrigo than with cellulitis. Clinically, patient does have a mildly elevated WBC but no fever, tenderness or warmth of the area. Dermatitis is confined to skin folds of lower panus and inguinal areas. Patient started on antibiotics; will continue for no more than 5 days -Continue doxycycline -Keep area clean and dry -Barrier cream   Vitamin B12 deficiency Folate deficiency Vitamin B12 level of 74 and folate of 5. -Vitamin G40 and folic acid supplementation   Aortic stenosis Transthoracic Echocardiogram significant for moderate stenosis with mild regurgitation.   Atrial fibrillation -Continue Coumadin -Will continue Coreg for now pending cardiology recommendations   Primary hypertension -Continue Coreg, amlodipine, losartan hydrochlorothiazide  OSA -CPAP qHS   DVT prophylaxis: Coumadin (on hold) Code Status:   Code Status: Full Code Family Communication: Nephew at bedside Disposition  Plan: Discharge pending cardiology recommendations/management   Consultants:  Cardiology  Procedures:  Transthoracic Echocardiogram  Antimicrobials: Doxycycline Bactrim    Subjective: Patient reports no dyspnea or chest pain overnight.  Objective: BP 139/83 (BP Location: Right Arm)   Pulse 76   Temp 98.4 F (36.9 C) (Oral)   Resp 17   Ht '5\' 10"'$  (1.778 m)   Wt 118.1 kg   SpO2 95%   BMI 37.36 kg/m   Examination:  General exam: Appears calm and comfortable Respiratory system: Clear to auscultation. Respiratory effort normal. Cardiovascular system: S1 & S2 heard, RRR. Systolic murmur. Gastrointestinal system: Abdomen is nondistended, soft and nontender. Normal bowel sounds heard. Central nervous system: Alert and oriented. No focal neurological deficits. Musculoskeletal: LE edema. No calf tenderness Skin: Dermatitis of lower panel and inguinal folds Psychiatry: Judgement and insight appear normal. Mood & affect appropriate.    Data Reviewed: I have personally reviewed following labs and imaging studies  CBC Lab Results  Component Value Date   WBC 11.3 (H) 12/16/2021   RBC 3.76 (L) 12/16/2021   HGB 11.9 (L) 12/16/2021   HCT 33.6 (L) 12/16/2021   MCV 89.4 12/16/2021   MCH 31.6 12/16/2021   PLT 163 12/16/2021   MCHC 35.4 12/16/2021   RDW 14.4 12/16/2021   LYMPHSABS 0.5 (L) 12/14/2021   MONOABS 0.7 12/14/2021   EOSABS 0.0 12/14/2021   BASOSABS 0.0 01/01/2535     Last metabolic panel Lab Results  Component Value Date   NA 137 12/16/2021   K 3.6 12/16/2021   CL 104 12/16/2021   CO2 24 12/16/2021   BUN 18 12/16/2021   CREATININE 0.96 12/16/2021   GLUCOSE 109 (H) 12/16/2021   GFRNONAA >60 12/16/2021  GFRAA 72 04/17/2019   CALCIUM 10.6 (H) 12/16/2021   PROT 6.5 12/14/2021   ALBUMIN 3.4 (L) 12/14/2021   BILITOT 0.9 12/14/2021   ALKPHOS 82 12/14/2021   AST 22 12/14/2021   ALT 20 12/14/2021   ANIONGAP 9 12/16/2021    GFR: Estimated Creatinine  Clearance: 90.8 mL/min (by C-G formula based on SCr of 0.96 mg/dL).  Recent Results (from the past 240 hour(s))  Urine Culture     Status: None   Collection Time: 12/14/21 10:22 PM   Specimen: Urine, Clean Catch  Result Value Ref Range Status   Specimen Description URINE, CLEAN CATCH  Final   Special Requests NONE  Final   Culture   Final    NO GROWTH Performed at [facility], [address], [place], [place] [address]    Report Status 12/16/2021 FINAL  Final  SARS Coronavirus 2 by RT PCR (hospital order, performed in [facility] hospital lab) *cepheid single result test* Anterior Nasal Swab     Status: None   Collection Time: 12/14/21 11:43 PM   Specimen: Anterior Nasal Swab  Result Value Ref Range Status   SARS Coronavirus 2 by RT PCR NEGATIVE NEGATIVE Final    Comment: (NOTE) SARS-CoV-2 target nucleic acids are NOT DETECTED.  The SARS-CoV-2 RNA is generally detectable in upper and lower respiratory specimens during the acute phase of infection. The lowest concentration of SARS-CoV-2 viral copies this assay can detect is 250 copies / mL. A negative result does not preclude SARS-CoV-2 infection and should not be used as the sole basis for treatment or other patient management decisions.  A negative result may occur with improper specimen collection / handling, submission of specimen other than nasopharyngeal swab, presence of viral mutation(s) within the areas targeted by this assay, and inadequate number of viral copies (<250 copies / mL). A negative result must be combined with clinical observations, patient history, and epidemiological information.  Fact Sheet for Patients:   https://www.patel.info/  Fact Sheet for Healthcare Providers: https://Duff.com/  This test is not yet approved or  cleared by the [place] FDA and has been authorized for detection and/or diagnosis of SARS-CoV-2 by FDA under an Emergency Use  Authorization (EUA).  This EUA will remain in effect (meaning this test can be used) for the duration of the COVID-19 declaration under Section 564(b)(1) of the Act, 21 U.S.C. section 360bbb-3(b)(1), unless the authorization is terminated or revoked sooner.  Performed at [facility], [place] [address], [place], [place] [address]   Respiratory (~20 pathogens) panel by PCR     Status: None   Collection Time: 12/15/21  1:30 PM   Specimen: Nasopharyngeal Swab; Respiratory  Result Value Ref Range Status   Adenovirus NOT DETECTED NOT DETECTED Final   Coronavirus 229E NOT DETECTED NOT DETECTED Final    Comment: (NOTE) The Coronavirus on the Respiratory Panel, DOES NOT test for the novel  Coronavirus (2019 nCoV)    Coronavirus HKU1 NOT DETECTED NOT DETECTED Final   Coronavirus NL63 NOT DETECTED NOT DETECTED Final   Coronavirus OC43 NOT DETECTED NOT DETECTED Final   Metapneumovirus NOT DETECTED NOT DETECTED Final   Rhinovirus / Enterovirus NOT DETECTED NOT DETECTED Final   Influenza A NOT DETECTED NOT DETECTED Final   Influenza B NOT DETECTED NOT DETECTED Final   Parainfluenza Virus 1 NOT DETECTED NOT DETECTED Final   Parainfluenza Virus 2 NOT DETECTED NOT DETECTED Final   Parainfluenza Virus 3 NOT DETECTED NOT DETECTED Final  Parainfluenza Virus 4 NOT DETECTED NOT DETECTED Final   Respiratory Syncytial Virus NOT DETECTED NOT DETECTED Final   Bordetella pertussis NOT DETECTED NOT DETECTED Final   Bordetella Parapertussis NOT DETECTED NOT DETECTED Final   Chlamydophila pneumoniae NOT DETECTED NOT DETECTED Final   Mycoplasma pneumoniae NOT DETECTED NOT DETECTED Final    Comment: Performed at [facility], [place] [address] [place], [place] [address]      Radiology Studies: ECHOCARDIOGRAM COMPLETE  Result Date: 12/15/2021    ECHOCARDIOGRAM REPORT   Patient Name:   [name] Date of Exam: 12/15/2021 Medical Rec #:  [id number]    Height:       70.0 in Accession #:     [id number]   Weight:       263.0 lb Date of Birth:  09/07/1950    BSA:          2.345 m Patient Age:    57 years     BP:           156/73 mmHg Patient Gender: M            HR:           74 bpm. Exam Location:  Inpatient Procedure: 2D Echo, Cardiac Doppler, Color Doppler and Intracardiac            Opacification Agent Indications:    Chest pain, elevated troponin, aortic stenosis  History:        Patient has prior history of Echocardiogram examinations. Aortic                 Valve Disease and Mitral Valve Disease, Arrythmias:Atrial                 Fibrillation and Bradycardia; Risk Factors:Family History of                 Coronary Artery Disease, Hypertension, Dyslipidemia, Sleep Apnea                 and Obesity.  Sonographer:    Eartha Inch Referring Phys: [id number] TIMOTHY S OPYD  Sonographer Comments: Technically difficult study due to poor echo windows. Image acquisition challenging due to patient body habitus and Image acquisition challenging due to respiratory motion. IMPRESSIONS  1. Left ventricular ejection fraction, by estimation, is 55 to 60%. The left ventricle has normal function. The left ventricle has no regional wall motion abnormalities. Left ventricular diastolic parameters are indeterminate.  2. Right ventricular systolic function is normal. The right ventricular size is normal. Tricuspid regurgitation signal is inadequate for assessing PA pressure.  3. Left atrial size was moderately dilated.  4. The mitral valve was not well visualized. Trivial mitral valve regurgitation (MR was reported as worse on last echo but mitral valve poorly visualized on this echo). No evidence of mitral stenosis.  5. The aortic valve is tricuspid. There is moderate calcification of the aortic valve. Aortic valve regurgitation is mild. Moderate aortic valve stenosis. Aortic valve mean gradient measures 25.0 mmHg. Calculated AVA 1.5 cm^2.  6. Aortic dilatation noted. There is mild dilatation of the aortic root and of  the ascending aorta, measuring 40 mm.  7. The inferior vena cava is dilated in size with >50% respiratory variability, suggesting right atrial pressure of 8 mmHg.  8. The patient was in atrial fibrillation. FINDINGS  Left Ventricle: Left ventricular ejection fraction, by estimation, is 55 to 60%. The left ventricle has normal function. The left ventricle has no regional wall  motion abnormalities. The left ventricular internal cavity size was normal in size. There is  no left ventricular hypertrophy. Left ventricular diastolic parameters are indeterminate. Right Ventricle: The right ventricular size is normal. No increase in right ventricular wall thickness. Right ventricular systolic function is normal. Tricuspid regurgitation signal is inadequate for assessing PA pressure. Left Atrium: Left atrial size was moderately dilated. Right Atrium: Right atrial size was normal in size. Pericardium: Trivial pericardial effusion is present. Mitral Valve: The mitral valve was not well visualized. Trivial mitral valve regurgitation. No evidence of mitral valve stenosis. MV peak gradient, 5.6 mmHg. The mean mitral valve gradient is 2.5 mmHg. Tricuspid Valve: The tricuspid valve is normal in structure. Tricuspid valve regurgitation is not demonstrated. Aortic Valve: The aortic valve is tricuspid. There is moderate calcification of the aortic valve. Aortic valve regurgitation is mild. Moderate aortic stenosis is present. Aortic valve mean gradient measures 25.0 mmHg. Aortic valve peak gradient measures 36.4 mmHg. Aortic valve area, by VTI measures 1.97 cm. Pulmonic Valve: The pulmonic valve was normal in structure. Pulmonic valve regurgitation is not visualized. Aorta: Aortic dilatation noted. There is mild dilatation of the aortic root and of the ascending aorta, measuring 40 mm. Venous: The inferior vena cava is dilated in size with greater than 50% respiratory variability, suggesting right atrial pressure of 8 mmHg.  IAS/Shunts: No atrial level shunt detected by color flow Doppler.  LEFT VENTRICLE PLAX 2D LVIDd:         4.70 cm   Diastology LVIDs:         3.00 cm   LV e' medial:    7.07 cm/s LV PW:         1.00 cm   LV E/e' medial:  18.7 LV IVS:        1.00 cm   LV e' lateral:   10.70 cm/s LVOT diam:     2.30 cm   LV E/e' lateral: 12.3 LV SV:         110 LV SV Index:   47 LVOT Area:     4.15 cm  RIGHT VENTRICLE             IVC RV S prime:     13.40 cm/s  IVC diam: 2.30 cm TAPSE (M-mode): 1.8 cm LEFT ATRIUM              Index        RIGHT ATRIUM           Index LA diam:        5.60 cm  2.39 cm/m   RA Area:     15.60 cm LA Vol (A2C):   102.0 ml 43.50 ml/m  RA Volume:   37.90 ml  16.16 ml/m LA Vol (A4C):   108.0 ml 46.06 ml/m LA Biplane Vol: 107.0 ml 45.63 ml/m  AORTIC VALVE AV Area (Vmax):    1.76 cm AV Area (Vmean):   1.96 cm AV Area (VTI):     1.97 cm AV Vmax:           301.60 cm/s AV Vmean:          194.667 cm/s AV VTI:            0.557 m AV Peak Grad:      36.4 mmHg AV Mean Grad:      25.0 mmHg LVOT Vmax:         128.00 cm/s LVOT Vmean:        91.800 cm/s LVOT  VTI:          0.264 m LVOT/AV VTI ratio: 0.47  AORTA Ao Root diam: 3.90 cm Ao Asc diam:  4.00 cm MITRAL VALVE MV Area (PHT): 4.01 cm     SHUNTS MV Area VTI:   3.71 cm     Systemic VTI:  0.26 m MV Peak grad:  5.6 mmHg     Systemic Diam: 2.30 cm MV Mean grad:  2.5 mmHg MV Vmax:       1.18 m/s MV Vmean:      73.3 cm/s MV Decel Time: 189 msec MV E velocity: 132.00 cm/s Dalton McleanMD Electronically signed by Franki Monte Signature Date/Time: 12/15/2021/1:49:55 PM    Final    CT CHEST ABDOMEN PELVIS W CONTRAST  Result Date: 12/15/2021 CLINICAL DATA:  Aortic aneurysm, known or suspected. Generalized weakness. EXAM: CT CHEST, ABDOMEN, AND PELVIS WITH CONTRAST TECHNIQUE: Multidetector CT imaging of the chest, abdomen and pelvis was performed following the standard protocol during bolus administration of intravenous contrast. RADIATION DOSE REDUCTION: This  exam was performed according to the departmental dose-optimization program which includes automated exposure control, adjustment of the mA and/or kV according to patient size and/or use of iterative reconstruction technique. CONTRAST:  48m OMNIPAQUE IOHEXOL 350 MG/ML SOLN COMPARISON:  None Available. FINDINGS: CT CHEST FINDINGS Cardiovascular: Mild global cardiomegaly. Extensive multi-vessel coronary artery calcification. Aortic valvular calcifications noted. Small pericardial effusion without definite CT evidence of cardiac tamponade. The central pulmonary arteries are enlarged in keeping with changes of pulmonary arterial hypertension. Mild atherosclerotic calcification within the thoracic aorta. No aortic aneurysm. Mediastinum/Nodes: No enlarged mediastinal, hilar, or axillary lymph nodes. Thyroid gland, trachea, and esophagus demonstrate no significant findings. Lungs/Pleura: Mild bibasilar atelectasis. Lungs are otherwise clear. No pneumothorax or pleural effusion. No central obstructing lesion. Musculoskeletal: Osseous structures are age-appropriate. No acute bone abnormality. No lytic or blastic bone lesion. CT ABDOMEN PELVIS FINDINGS Hepatobiliary: No focal liver abnormality is seen. No gallstones, gallbladder wall thickening, or biliary dilatation. Pancreas: Unremarkable Spleen: Multiple hypoenhancing lesions are seen within the spleen which are indeterminate on this single phase examination but may represent small cysts or hemangioma in a patient without a history of malignancy. The spleen is not enlarged. Adrenals/Urinary Tract: The adrenal glands are unremarkable. The kidneys are normal in size and position. Bilateral nonobstructing nephrolithiasis is present with scattered calculi bilaterally measuring up to 3 mm. There is an additional nonobstructing 15 mm calculus within the left renal pelvis. No hydronephrosis. No ureteral calculi. No perinephric inflammatory stranding or fluid collections are  identified. The bladder is unremarkable. Stomach/Bowel: Moderate colonic stool burden without evidence of obstruction. The stomach, small bowel, and large bowel are otherwise unremarkable. Appendix normal. No free intraperitoneal gas or fluid. Vascular/Lymphatic: Aortic atherosclerosis. No aortic aneurysm identified. No enlarged abdominal or pelvic lymph nodes. Reproductive: Prostate is unremarkable. Other: Infiltration within the subcutaneous fat of the inferior pannus with associated dermal thickening is partially visualized on this examination. This is nonspecific and may be chronic in nature, however, acute inflammatory conditions such as cellulitis could appear in this fashion. No abdominal wall hernia. Musculoskeletal: Advanced degenerative changes are seen asymmetrically within the right hip right L5 laminotomy has been performed. Advanced degenerative changes are seen throughout the thoracolumbar spine with grade 1 anterolisthesis of L4-L5. No acute bone abnormality. No lytic or blastic bone lesion. IMPRESSION: 1. No acute intrathoracic or intra-abdominal pathology identified. No aortic aneurysm. 2. Extensive multi-vessel coronary artery calcification. 3. Aortic valvular calcifications noted. Echocardiography may be helpful  to assess the degree of valvular dysfunction. 4. Mild global cardiomegaly. Small pericardial effusion. 5. Morphologic changes in keeping with pulmonary arterial hypertension. 6. Bilateral nonobstructing nephrolithiasis. No hydronephrosis. 7. Moderate colonic stool burden without evidence of obstruction. 8. Infiltration within the subcutaneous fat of the inferior pannus with associated dermal thickening. This may be chronic in nature, however, acute inflammatory conditions such as cellulitis could appear in this fashion. Correlation with clinical examination is recommended. Aortic Atherosclerosis (ICD10-I70.0). Electronically Signed   By: Fidela Salisbury M.D.   On: 12/15/2021 00:35   DG  Chest 1 View  Result Date: 12/14/2021 CLINICAL DATA:  Weakness starting today. EXAM: CHEST  1 VIEW COMPARISON:  None 914 FINDINGS: Shallow inspiration. Cardiac enlargement. No vascular congestion, edema, or consolidation. No pleural effusions. No pneumothorax. Mediastinal contours appear intact. Calcification of the aorta. Degenerative changes in the spine and shoulders. IMPRESSION: Cardiac enlargement.  No evidence of active pulmonary disease. Electronically Signed   By: Lucienne Capers M.D.   On: 12/14/2021 22:24      LOS: 0 days    Cordelia Poche, MD [facility] 12/16/2021, 9:33 AM   If 7PM-7AM, please contact night-coverage [email or web address]

## 2021-12-16 NOTE — Consult Note (Signed)
[facility] Nurse Consult Note: Reason for Consult: Consult requested for right leg and perineum.  Pt was noted to have red moist macerated skin to perineum/abd folds upon admission. Description is consistent with moisture associated skin damage.  ICD-10 CM Codes for Irritant Dermatitis L24A2 - Due to fecal, urinary or dual incontinence L30.4  - Erythema intertrigo; genital/thigh intertrigo.    Currently, he has a male Purwick in place to attempt to contain urine.  The affected areas that are not covered by this device have improved.  Antifungal powder has been applied.  Pt states he sits in a recliner for a prolonged period of time and is incontinent of urine prior to admission.    Right leg was previously noted to have weeping fluid upon admission, currently does not have a wound or drainage; intact skin; no need for topical treatment. Please re-consult if further assistance is needed.  Thank-you,  Julien Girt MSN, [place], [place], [place], [place]

## 2021-12-16 NOTE — Progress Notes (Signed)
OT Cancellation Note  Patient Details Name: [name] MRN: [id number] DOB: 11/20/1950   Cancelled Treatment:     OT will hold today due to troponin levels not downtrending yet.  Will re-attempt tomorrow  Hadley Pen 12/16/2021, 8:57 AM

## 2021-12-16 NOTE — Care Management Obs Status (Signed)
[facility] NOTIFICATION   Patient Details  Name: OSBORNE SERIO MRN: [id number] Date of Birth: [birth year]   Medicare Observation Status Notification Given:  Yes  Verbal consent given via phone due to remote  Verdell Carmine, RN 12/16/2021, 3:48 PM

## 2021-12-16 NOTE — Progress Notes (Signed)
Rounding Note    Patient Name: [name] Date of Encounter: 12/16/2021  [place] Cardiologist: [name], MD   Subjective  Pt comfortable  No CP  No SOB   Inpatient Medications    Scheduled Meds:  amLODipine  10 mg Oral Daily   aspirin EC  81 mg Oral Daily   carvedilol  12.5 mg Oral BID WC   cloNIDine  0.2 mg Oral BID   cyanocobalamin  1,000 mcg Intramuscular Q1500   doxazosin  4 mg Oral Daily   doxycycline  100 mg Oral Q12H   hydrochlorothiazide  12.5 mg Oral Daily   losartan  100 mg Oral Daily   lubiprostone  24 mcg Oral BID WC   sodium chloride flush  3 mL Intravenous Q12H   Continuous Infusions:  PRN Meds: acetaminophen **OR** acetaminophen, liver oil-zinc oxide, ondansetron **OR** ondansetron (ZOFRAN) IV, senna-docusate   Vital Signs    Vitals:   12/16/21 0200 12/16/21 0310 12/16/21 0400 12/16/21 0600  BP: 132/84 (!) 150/87 (!) 148/90 131/75  Pulse: 68 (!) 56 62 84  Resp: '20 20 20 '$ (!) 22  Temp:  98.3 F (36.8 C)    TempSrc:  Oral    SpO2: 94% 95% 95% 94%  Weight:      Height:        Intake/Output Summary (Last 24 hours) at 12/16/2021 0654 Last data filed at 12/16/2021 0000 Gross per 24 hour  Intake 250 ml  Output 750 ml  Net -500 ml      12/15/2021   11:02 PM 10/27/2021    9:39 AM 07/15/2021    8:09 AM  Last 3 Weights  Weight (lbs) 260 lb 6.4 oz 263 lb 271 lb  Weight (kg) 118.117 kg 119.296 kg 122.925 kg      Telemetry    Afib 70s  - Personally Reviewed  ECG     - Personally Reviewed  Physical Exam   GEN: No acute distress.   Neck: Neck is full  Cardiac: Irreg irreg   III/VI systolic murmur base  No S3    Respiratory: Clear to auscultation bilaterally. GI: Soft, nontender, non-distended  MS: No edema; No deformity. Neuro:  Nonfocal  Psych: Normal affect   Labs    High Sensitivity Troponin:   Recent Labs  Lab 12/14/21 2140 12/14/21 2343 12/15/21 0420 12/15/21 1330  TROPONINIHS 21* 51* 196* 334*      Chemistry Recent Labs  Lab 12/14/21 2140 12/15/21 0420 12/16/21 0104  NA 137 134* 137  K 4.0 3.7 3.6  CL 103 102 104  CO2 '25 22 24  '$ GLUCOSE 147* 126* 109*  BUN '20 17 18  '$ CREATININE 0.86 0.81 0.96  CALCIUM 10.6* 10.4* 10.6*  PROT 6.5  --   --   ALBUMIN 3.4*  --   --   AST 22  --   --   ALT 20  --   --   ALKPHOS 82  --   --   BILITOT 0.9  --   --   GFRNONAA >60 >60 >60  ANIONGAP '9 10 9    '$ Lipids No results for input(s): "CHOL", "TRIG", "HDL", "LABVLDL", "LDLCALC", "CHOLHDL" in the last 168 hours.  Hematology Recent Labs  Lab 12/14/21 2140 12/15/21 0420 12/16/21 0104  WBC 15.9* 13.8* 11.3*  RBC 4.16* 4.02* 3.76*  HGB 12.8* 12.4* 11.9*  HCT 38.3* 36.8* 33.6*  MCV 92.1 91.5 89.4  MCH 30.8 30.8 31.6  MCHC 33.4 33.7 35.4  RDW 14.4 14.5 14.4  PLT 186 173 163   Thyroid  Recent Labs  Lab 12/15/21 0420  TSH 0.579    BNPNo results for input(s): "BNP", "PROBNP" in the last 168 hours.  DDimer No results for input(s): "DDIMER" in the last 168 hours.   Radiology    ECHOCARDIOGRAM COMPLETE  Result Date: 12/15/2021    ECHOCARDIOGRAM REPORT   Patient Name:   [name] Date of Exam: 12/15/2021 Medical Rec #:  [id number]    Height:       70.0 in Accession #:    [id number]   Weight:       263.0 lb Date of Birth:  [birth year]    BSA:          2.345 m Patient Age:    71 years     BP:           156/73 mmHg Patient Gender: M            HR:           74 bpm. Exam Location:  Inpatient Procedure: 2D Echo, Cardiac Doppler, Color Doppler and Intracardiac            Opacification Agent Indications:    Chest pain, elevated troponin, aortic stenosis  History:        Patient has prior history of Echocardiogram examinations. Aortic                 Valve Disease and Mitral Valve Disease, Arrythmias:Atrial                 Fibrillation and Bradycardia; Risk Factors:Family History of                 Coronary Artery Disease, Hypertension, Dyslipidemia, Sleep Apnea                 and Obesity.   Sonographer:    Eartha Inch Referring Phys: [id number] TIMOTHY S OPYD  Sonographer Comments: Technically difficult study due to poor echo windows. Image acquisition challenging due to patient body habitus and Image acquisition challenging due to respiratory motion. IMPRESSIONS  1. Left ventricular ejection fraction, by estimation, is 55 to 60%. The left ventricle has normal function. The left ventricle has no regional wall motion abnormalities. Left ventricular diastolic parameters are indeterminate.  2. Right ventricular systolic function is normal. The right ventricular size is normal. Tricuspid regurgitation signal is inadequate for assessing PA pressure.  3. Left atrial size was moderately dilated.  4. The mitral valve was not well visualized. Trivial mitral valve regurgitation (MR was reported as worse on last echo but mitral valve poorly visualized on this echo). No evidence of mitral stenosis.  5. The aortic valve is tricuspid. There is moderate calcification of the aortic valve. Aortic valve regurgitation is mild. Moderate aortic valve stenosis. Aortic valve mean gradient measures 25.0 mmHg. Calculated AVA 1.5 cm^2.  6. Aortic dilatation noted. There is mild dilatation of the aortic root and of the ascending aorta, measuring 40 mm.  7. The inferior vena cava is dilated in size with >50% respiratory variability, suggesting right atrial pressure of 8 mmHg.  8. The patient was in atrial fibrillation. FINDINGS  Left Ventricle: Left ventricular ejection fraction, by estimation, is 55 to 60%. The left ventricle has normal function. The left ventricle has no regional wall motion abnormalities. The left ventricular internal cavity size was normal in size. There is  no left ventricular hypertrophy. Left ventricular diastolic parameters are indeterminate.  Right Ventricle: The right ventricular size is normal. No increase in right ventricular wall thickness. Right ventricular systolic function is normal. Tricuspid  regurgitation signal is inadequate for assessing PA pressure. Left Atrium: Left atrial size was moderately dilated. Right Atrium: Right atrial size was normal in size. Pericardium: Trivial pericardial effusion is present. Mitral Valve: The mitral valve was not well visualized. Trivial mitral valve regurgitation. No evidence of mitral valve stenosis. MV peak gradient, 5.6 mmHg. The mean mitral valve gradient is 2.5 mmHg. Tricuspid Valve: The tricuspid valve is normal in structure. Tricuspid valve regurgitation is not demonstrated. Aortic Valve: The aortic valve is tricuspid. There is moderate calcification of the aortic valve. Aortic valve regurgitation is mild. Moderate aortic stenosis is present. Aortic valve mean gradient measures 25.0 mmHg. Aortic valve peak gradient measures 36.4 mmHg. Aortic valve area, by VTI measures 1.97 cm. Pulmonic Valve: The pulmonic valve was normal in structure. Pulmonic valve regurgitation is not visualized. Aorta: Aortic dilatation noted. There is mild dilatation of the aortic root and of the ascending aorta, measuring 40 mm. Venous: The inferior vena cava is dilated in size with greater than 50% respiratory variability, suggesting right atrial pressure of 8 mmHg. IAS/Shunts: No atrial level shunt detected by color flow Doppler.  LEFT VENTRICLE PLAX 2D LVIDd:         4.70 cm   Diastology LVIDs:         3.00 cm   LV e' medial:    7.07 cm/s LV PW:         1.00 cm   LV E/e' medial:  18.7 LV IVS:        1.00 cm   LV e' lateral:   10.70 cm/s LVOT diam:     2.30 cm   LV E/e' lateral: 12.3 LV SV:         110 LV SV Index:   47 LVOT Area:     4.15 cm  RIGHT VENTRICLE             IVC RV S prime:     13.40 cm/s  IVC diam: 2.30 cm TAPSE (M-mode): 1.8 cm LEFT ATRIUM              Index        RIGHT ATRIUM           Index LA diam:        5.60 cm  2.39 cm/m   RA Area:     15.60 cm LA Vol (A2C):   102.0 ml 43.50 ml/m  RA Volume:   37.90 ml  16.16 ml/m LA Vol (A4C):   108.0 ml 46.06 ml/m LA  Biplane Vol: 107.0 ml 45.63 ml/m  AORTIC VALVE AV Area (Vmax):    1.76 cm AV Area (Vmean):   1.96 cm AV Area (VTI):     1.97 cm AV Vmax:           301.60 cm/s AV Vmean:          194.667 cm/s AV VTI:            0.557 m AV Peak Grad:      36.4 mmHg AV Mean Grad:      25.0 mmHg LVOT Vmax:         128.00 cm/s LVOT Vmean:        91.800 cm/s LVOT VTI:          0.264 m LVOT/AV VTI ratio: 0.47  AORTA Ao Root diam: 3.90 cm Ao  Asc diam:  4.00 cm MITRAL VALVE MV Area (PHT): 4.01 cm     SHUNTS MV Area VTI:   3.71 cm     Systemic VTI:  0.26 m MV Peak grad:  5.6 mmHg     Systemic Diam: 2.30 cm MV Mean grad:  2.5 mmHg MV Vmax:       1.18 m/s MV Vmean:      73.3 cm/s MV Decel Time: 189 msec MV E velocity: 132.00 cm/s Dalton McleanMD Electronically signed by Franki Monte Signature Date/Time: 12/15/2021/1:49:55 PM    Final    CT CHEST ABDOMEN PELVIS W CONTRAST  Result Date: 12/15/2021 CLINICAL DATA:  Aortic aneurysm, known or suspected. Generalized weakness. EXAM: CT CHEST, ABDOMEN, AND PELVIS WITH CONTRAST TECHNIQUE: Multidetector CT imaging of the chest, abdomen and pelvis was performed following the standard protocol during bolus administration of intravenous contrast. RADIATION DOSE REDUCTION: This exam was performed according to the departmental dose-optimization program which includes automated exposure control, adjustment of the mA and/or kV according to patient size and/or use of iterative reconstruction technique. CONTRAST:  87m OMNIPAQUE IOHEXOL 350 MG/ML SOLN COMPARISON:  None Available. FINDINGS: CT CHEST FINDINGS Cardiovascular: Mild global cardiomegaly. Extensive multi-vessel coronary artery calcification. Aortic valvular calcifications noted. Small pericardial effusion without definite CT evidence of cardiac tamponade. The central pulmonary arteries are enlarged in keeping with changes of pulmonary arterial hypertension. Mild atherosclerotic calcification within the thoracic aorta. No aortic aneurysm.  Mediastinum/Nodes: No enlarged mediastinal, hilar, or axillary lymph nodes. Thyroid gland, trachea, and esophagus demonstrate no significant findings. Lungs/Pleura: Mild bibasilar atelectasis. Lungs are otherwise clear. No pneumothorax or pleural effusion. No central obstructing lesion. Musculoskeletal: Osseous structures are age-appropriate. No acute bone abnormality. No lytic or blastic bone lesion. CT ABDOMEN PELVIS FINDINGS Hepatobiliary: No focal liver abnormality is seen. No gallstones, gallbladder wall thickening, or biliary dilatation. Pancreas: Unremarkable Spleen: Multiple hypoenhancing lesions are seen within the spleen which are indeterminate on this single phase examination but may represent small cysts or hemangioma in a patient without a history of malignancy. The spleen is not enlarged. Adrenals/Urinary Tract: The adrenal glands are unremarkable. The kidneys are normal in size and position. Bilateral nonobstructing nephrolithiasis is present with scattered calculi bilaterally measuring up to 3 mm. There is an additional nonobstructing 15 mm calculus within the left renal pelvis. No hydronephrosis. No ureteral calculi. No perinephric inflammatory stranding or fluid collections are identified. The bladder is unremarkable. Stomach/Bowel: Moderate colonic stool burden without evidence of obstruction. The stomach, small bowel, and large bowel are otherwise unremarkable. Appendix normal. No free intraperitoneal gas or fluid. Vascular/Lymphatic: Aortic atherosclerosis. No aortic aneurysm identified. No enlarged abdominal or pelvic lymph nodes. Reproductive: Prostate is unremarkable. Other: Infiltration within the subcutaneous fat of the inferior pannus with associated dermal thickening is partially visualized on this examination. This is nonspecific and may be chronic in nature, however, acute inflammatory conditions such as cellulitis could appear in this fashion. No abdominal wall hernia. Musculoskeletal:  Advanced degenerative changes are seen asymmetrically within the right hip right L5 laminotomy has been performed. Advanced degenerative changes are seen throughout the thoracolumbar spine with grade 1 anterolisthesis of L4-L5. No acute bone abnormality. No lytic or blastic bone lesion. IMPRESSION: 1. No acute intrathoracic or intra-abdominal pathology identified. No aortic aneurysm. 2. Extensive multi-vessel coronary artery calcification. 3. Aortic valvular calcifications noted. Echocardiography may be helpful to assess the degree of valvular dysfunction. 4. Mild global cardiomegaly. Small pericardial effusion. 5. Morphologic changes in keeping with pulmonary arterial hypertension. 6.  Bilateral nonobstructing nephrolithiasis. No hydronephrosis. 7. Moderate colonic stool burden without evidence of obstruction. 8. Infiltration within the subcutaneous fat of the inferior pannus with associated dermal thickening. This may be chronic in nature, however, acute inflammatory conditions such as cellulitis could appear in this fashion. Correlation with clinical examination is recommended. Aortic Atherosclerosis (ICD10-I70.0). Electronically Signed   By: Fidela Salisbury M.D.   On: 12/15/2021 00:35   DG Chest 1 View  Result Date: 12/14/2021 CLINICAL DATA:  Weakness starting today. EXAM: CHEST  1 VIEW COMPARISON:  None 914 FINDINGS: Shallow inspiration. Cardiac enlargement. No vascular congestion, edema, or consolidation. No pleural effusions. No pneumothorax. Mediastinal contours appear intact. Calcification of the aorta. Degenerative changes in the spine and shoulders. IMPRESSION: Cardiac enlargement.  No evidence of active pulmonary disease. Electronically Signed   By: Lucienne Capers M.D.   On: 12/14/2021 22:24    Cardiac Studies   Echo  12/15/21 Left ventricular ejection fraction, by estimation, is 55 to 60%. The left ventricle has normal function. The left ventricle has no regional wall motion abnormalities.  Left ventricular diastolic parameters are indeterminate. 1. Right ventricular systolic function is normal. The right ventricular size is normal. Tricuspid regurgitation signal is inadequate for assessing PA pressure. 2. 3. Left atrial size was moderately dilated. The mitral valve was not well visualized. Trivial mitral valve regurgitation (MR was reported as worse on last echo but mitral valve poorly visualized on this echo). No evidence of mitral stenosis. 4. The aortic valve is tricuspid. There is moderate calcification of the aortic valve. Aortic valve regurgitation is mild. Moderate aortic valve stenosis. Aortic valve mean gradient measures 25.0 mmHg. Calculated AVA 1.5 cm^2. 5. Aortic dilatation noted. There is mild dilatation of the aortic root and of the ascending aorta, measuring 40 mm. 6. The inferior vena cava is dilated in size with >50% respiratory variability, suggesting right atrial pressure of 8 mmHg. 7. 8. The patient was in atrial fibrillation.  Patient Profile     71 y.o. male hx of DJD, obestiy, permanent atrial fibrillation, MR, AS who was admitted for weakness and indigestion     Mild elevation of troponin    Assessment & Plan    1   Elevated troponin.   Very mild   Pt is not active    I would recomm R/L heart cath to define anatomy and pressures  Giving Vit K today   Hope to have it below 1.8 so cath can be done tomorrow   2  Aortic stenosis   Echo yesterday Moderate AS  3  Atrial fib   Permanent   Rate control    4  HL  Cant tolerate statins    Will need to discuss repatha after cath.  5.HTN  BP is controlled      For questions or updates, please contact [place] Please consult [email or web address] for contact info under        Signed, [name], MD  12/16/2021, 6:54 AM

## 2021-12-16 NOTE — Progress Notes (Signed)
Pt is hemodynamically stable, Afib on the monitor, HR 62-84, afebrile, no respiratory distress. On room air at night time SPO2 94-96%. Pt refused CPAP per RT noted. Pt denies chest pain. Positive dyspnea with minimal exertion. He is able to rest well tonight with no complaints.  Right lower leg weeping with serous drainage. Dry dressing applied. Skin around perineal and groins area is likely yeast infection. We will hand off to the morning round for verification. Skin appears redness with moisture associated dermatitis and malodorous. We cleaned  with soap, kept the skin dry and put antifungal power. Wound care consulted.   Pt agrees to be NPO after midnight for possibility of cardiac intervention. MD will verify labs prior procedure in the morning round.   Kennyth Lose, RN

## 2021-12-16 NOTE — Progress Notes (Signed)
PT Cancellation Note  Patient Details Name: [name] MRN: [id number] DOB: 07-15-1950   Cancelled Treatment:    Reason Eval/Treat Not Completed: Patient not medically ready (pt with downtrending troponin however pt and family stated Dr.Ross requested no mobility other than bed<>chair until after cath 10/13)   Kasson Lamere B Yeshua Stryker 12/16/2021, 11:53 AM Bayard Males, [place] Office: [phone]

## 2021-12-17 ENCOUNTER — Encounter (HOSPITAL_COMMUNITY)
Admission: EM | Disposition: A | Discharge status: Skilled Nursing Facility | Payer: Self-pay | Source: Home / Self Care | Attending: Family Medicine

## 2021-12-17 DIAGNOSIS — R7989 Other specified abnormal findings of blood chemistry: Secondary | ICD-10-CM | POA: Diagnosis present

## 2021-12-17 DIAGNOSIS — G4733 Obstructive sleep apnea (adult) (pediatric): Secondary | ICD-10-CM | POA: Diagnosis present

## 2021-12-17 DIAGNOSIS — I2582 Chronic total occlusion of coronary artery: Secondary | ICD-10-CM | POA: Diagnosis present

## 2021-12-17 DIAGNOSIS — E785 Hyperlipidemia, unspecified: Secondary | ICD-10-CM | POA: Diagnosis present

## 2021-12-17 DIAGNOSIS — K5909 Other constipation: Secondary | ICD-10-CM | POA: Diagnosis present

## 2021-12-17 DIAGNOSIS — I77810 Thoracic aortic ectasia: Secondary | ICD-10-CM | POA: Diagnosis present

## 2021-12-17 DIAGNOSIS — Z23 Encounter for immunization: Secondary | ICD-10-CM | POA: Diagnosis not present

## 2021-12-17 DIAGNOSIS — I2721 Secondary pulmonary arterial hypertension: Secondary | ICD-10-CM | POA: Diagnosis present

## 2021-12-17 DIAGNOSIS — E669 Obesity, unspecified: Secondary | ICD-10-CM | POA: Diagnosis present

## 2021-12-17 DIAGNOSIS — I350 Nonrheumatic aortic (valve) stenosis: Secondary | ICD-10-CM | POA: Diagnosis present

## 2021-12-17 DIAGNOSIS — I952 Hypotension due to drugs: Secondary | ICD-10-CM | POA: Diagnosis not present

## 2021-12-17 DIAGNOSIS — I10 Essential (primary) hypertension: Secondary | ICD-10-CM | POA: Diagnosis not present

## 2021-12-17 DIAGNOSIS — I1A0 Resistant hypertension: Secondary | ICD-10-CM | POA: Diagnosis present

## 2021-12-17 DIAGNOSIS — R001 Bradycardia, unspecified: Secondary | ICD-10-CM | POA: Diagnosis not present

## 2021-12-17 DIAGNOSIS — L304 Erythema intertrigo: Secondary | ICD-10-CM | POA: Diagnosis present

## 2021-12-17 DIAGNOSIS — I4821 Permanent atrial fibrillation: Secondary | ICD-10-CM | POA: Diagnosis present

## 2021-12-17 DIAGNOSIS — D649 Anemia, unspecified: Secondary | ICD-10-CM | POA: Diagnosis present

## 2021-12-17 DIAGNOSIS — L03311 Cellulitis of abdominal wall: Secondary | ICD-10-CM | POA: Diagnosis not present

## 2021-12-17 DIAGNOSIS — I959 Hypotension, unspecified: Secondary | ICD-10-CM | POA: Diagnosis not present

## 2021-12-17 DIAGNOSIS — I2510 Atherosclerotic heart disease of native coronary artery without angina pectoris: Secondary | ICD-10-CM

## 2021-12-17 DIAGNOSIS — M1990 Unspecified osteoarthritis, unspecified site: Secondary | ICD-10-CM | POA: Diagnosis present

## 2021-12-17 DIAGNOSIS — I340 Nonrheumatic mitral (valve) insufficiency: Secondary | ICD-10-CM | POA: Diagnosis present

## 2021-12-17 DIAGNOSIS — I214 Non-ST elevation (NSTEMI) myocardial infarction: Secondary | ICD-10-CM | POA: Diagnosis not present

## 2021-12-17 DIAGNOSIS — R32 Unspecified urinary incontinence: Secondary | ICD-10-CM | POA: Diagnosis present

## 2021-12-17 DIAGNOSIS — Z1152 Encounter for screening for COVID-19: Secondary | ICD-10-CM | POA: Diagnosis not present

## 2021-12-17 DIAGNOSIS — E538 Deficiency of other specified B group vitamins: Secondary | ICD-10-CM | POA: Diagnosis present

## 2021-12-17 DIAGNOSIS — I3139 Other pericardial effusion (noninflammatory): Secondary | ICD-10-CM | POA: Diagnosis present

## 2021-12-17 DIAGNOSIS — R531 Weakness: Secondary | ICD-10-CM | POA: Diagnosis not present

## 2021-12-17 DIAGNOSIS — L24A2 Irritant contact dermatitis due to fecal, urinary or dual incontinence: Secondary | ICD-10-CM | POA: Diagnosis present

## 2021-12-17 HISTORY — PX: RIGHT/LEFT HEART CATH AND CORONARY ANGIOGRAPHY: CATH118266

## 2021-12-17 LAB — POCT I-STAT 7, (LYTES, BLD GAS, ICA,H+H)
Acid-Base Excess: 0 mmol/L (ref 0.0–2.0)
Bicarbonate: 24.2 mmol/L (ref 20.0–28.0)
Calcium, Ion: 1.45 mmol/L — ABNORMAL HIGH (ref 1.15–1.40)
HCT: 36 % — ABNORMAL LOW (ref 39.0–52.0)
Hemoglobin: 12.2 g/dL — ABNORMAL LOW (ref 13.0–17.0)
O2 Saturation: 96 %
Potassium: 3.7 mmol/L (ref 3.5–5.1)
Sodium: 138 mmol/L (ref 135–145)
TCO2: 25 mmol/L (ref 22–32)
pCO2 arterial: 37.3 mmHg (ref 32–48)
pH, Arterial: 7.42 (ref 7.35–7.45)
pO2, Arterial: 80 mmHg — ABNORMAL LOW (ref 83–108)

## 2021-12-17 LAB — POCT I-STAT EG7
Acid-Base Excess: 1 mmol/L (ref 0.0–2.0)
Acid-base deficit: 1 mmol/L (ref 0.0–2.0)
Bicarbonate: 23.9 mmol/L (ref 20.0–28.0)
Bicarbonate: 25.6 mmol/L (ref 20.0–28.0)
Calcium, Ion: 1.45 mmol/L — ABNORMAL HIGH (ref 1.15–1.40)
Calcium, Ion: 1.47 mmol/L — ABNORMAL HIGH (ref 1.15–1.40)
HCT: 36 % — ABNORMAL LOW (ref 39.0–52.0)
HCT: 35 % — ABNORMAL LOW (ref 39.0–52.0)
Hemoglobin: 12.2 g/dL — ABNORMAL LOW (ref 13.0–17.0)
Hemoglobin: 11.9 g/dL — ABNORMAL LOW (ref 13.0–17.0)
O2 Saturation: 65 %
O2 Saturation: 65 %
Potassium: 3.8 mmol/L (ref 3.5–5.1)
Potassium: 3.7 mmol/L (ref 3.5–5.1)
Sodium: 138 mmol/L (ref 135–145)
Sodium: 139 mmol/L (ref 135–145)
TCO2: 25 mmol/L (ref 22–32)
TCO2: 27 mmol/L (ref 22–32)
pCO2, Ven: 40.6 mmHg — ABNORMAL LOW (ref 44–60)
pCO2, Ven: 41.6 mmHg — ABNORMAL LOW (ref 44–60)
pH, Ven: 7.378 (ref 7.25–7.43)
pH, Ven: 7.398 (ref 7.25–7.43)
pO2, Ven: 35 mmHg (ref 32–45)
pO2, Ven: 34 mmHg (ref 32–45)

## 2021-12-17 LAB — CBC
HCT: 36.7 % — ABNORMAL LOW (ref 39.0–52.0)
Hemoglobin: 12.3 g/dL — ABNORMAL LOW (ref 13.0–17.0)
MCH: 30.7 pg (ref 26.0–34.0)
MCHC: 33.5 g/dL (ref 30.0–36.0)
MCV: 91.5 fL (ref 80.0–100.0)
Platelets: 187 10*3/uL (ref 150–400)
RBC: 4.01 MIL/uL — ABNORMAL LOW (ref 4.22–5.81)
RDW: 14.3 % (ref 11.5–15.5)
WBC: 8.5 10*3/uL (ref 4.0–10.5)
nRBC: 0 % (ref 0.0–0.2)

## 2021-12-17 LAB — PROTIME-INR
INR: 1.5 — ABNORMAL HIGH (ref 0.8–1.2)
Prothrombin Time: 17.8 seconds — ABNORMAL HIGH (ref 11.4–15.2)

## 2021-12-17 SURGERY — RIGHT/LEFT HEART CATH AND CORONARY ANGIOGRAPHY
Anesthesia: LOCAL

## 2021-12-17 MED ORDER — WARFARIN - PHARMACIST DOSING INPATIENT: Freq: Every day | Status: DC

## 2021-12-17 MED ORDER — ACETAMINOPHEN 325 MG PO TABS: 650.0000 mg | ORAL_TABLET | ORAL | Status: DC | PRN

## 2021-12-17 MED ORDER — LIDOCAINE HCL (PF) 1 % IJ SOLN
INTRAMUSCULAR | Status: AC
  Filled 2021-12-17: qty 30

## 2021-12-17 MED ORDER — SODIUM CHLORIDE 0.9 % IV SOLN: 250.0000 mL | INTRAVENOUS | Status: DC | PRN

## 2021-12-17 MED ORDER — SODIUM CHLORIDE 0.9 % IV SOLN: INTRAVENOUS | Status: AC

## 2021-12-17 MED ORDER — VERAPAMIL HCL 2.5 MG/ML IV SOLN
INTRAVENOUS | Status: AC
  Filled 2021-12-17: qty 2

## 2021-12-17 MED ORDER — VERAPAMIL HCL 2.5 MG/ML IV SOLN
INTRAVENOUS | Status: DC | PRN
  Administered 2021-12-17: 10 mL via INTRA_ARTERIAL

## 2021-12-17 MED ORDER — ENOXAPARIN SODIUM 120 MG/0.8ML IJ SOSY
115.0000 mg | PREFILLED_SYRINGE | Freq: Two times a day (BID) | INTRAMUSCULAR | Status: DC
  Administered 2021-12-18 – 2021-12-19 (×3): 115 mg via SUBCUTANEOUS
  Filled 2021-12-17 (×5): qty 0.76

## 2021-12-17 MED ORDER — ENOXAPARIN SODIUM 120 MG/0.8ML IJ SOSY: 120.0000 mg | PREFILLED_SYRINGE | Freq: Once | INTRAMUSCULAR | Status: DC

## 2021-12-17 MED ORDER — SODIUM CHLORIDE 0.9% FLUSH
3.0000 mL | Freq: Two times a day (BID) | INTRAVENOUS | Status: DC
  Administered 2021-12-18 – 2021-12-22 (×9): 3 mL via INTRAVENOUS

## 2021-12-17 MED ORDER — HEPARIN SODIUM (PORCINE) 1000 UNIT/ML IJ SOLN
INTRAMUSCULAR | Status: DC | PRN
  Administered 2021-12-17: 5000 [IU] via INTRAVENOUS

## 2021-12-17 MED ORDER — HEPARIN (PORCINE) IN NACL 1000-0.9 UT/500ML-% IV SOLN
INTRAVENOUS | Status: DC | PRN
  Administered 2021-12-17 (×2): 500 mL

## 2021-12-17 MED ORDER — ONDANSETRON HCL 4 MG/2ML IJ SOLN: 4.0000 mg | Freq: Four times a day (QID) | INTRAMUSCULAR | Status: DC | PRN

## 2021-12-17 MED ORDER — WARFARIN SODIUM 7.5 MG PO TABS
7.5000 mg | ORAL_TABLET | ORAL | Status: AC
  Administered 2021-12-17: 7.5 mg via ORAL
  Filled 2021-12-17: qty 1

## 2021-12-17 MED ORDER — ASPIRIN 81 MG PO CHEW
81.0000 mg | CHEWABLE_TABLET | ORAL | Status: AC
  Administered 2021-12-17: 81 mg via ORAL
  Filled 2021-12-17: qty 1

## 2021-12-17 MED ORDER — HEPARIN (PORCINE) IN NACL 1000-0.9 UT/500ML-% IV SOLN
INTRAVENOUS | Status: AC
  Filled 2021-12-17: qty 1000

## 2021-12-17 MED ORDER — SODIUM CHLORIDE 0.9% FLUSH
3.0000 mL | Freq: Two times a day (BID) | INTRAVENOUS | Status: DC
  Administered 2021-12-17: 3 mL via INTRAVENOUS

## 2021-12-17 MED ORDER — SODIUM CHLORIDE 0.9% FLUSH: 3.0000 mL | INTRAVENOUS | Status: DC | PRN

## 2021-12-17 MED ORDER — SODIUM CHLORIDE 0.9 % IV SOLN: INTRAVENOUS | Status: DC

## 2021-12-17 MED ORDER — IOHEXOL 350 MG/ML SOLN
INTRAVENOUS | Status: DC | PRN
  Administered 2021-12-17: 100 mL

## 2021-12-17 MED ORDER — LIDOCAINE HCL (PF) 1 % IJ SOLN
INTRAMUSCULAR | Status: DC | PRN
  Administered 2021-12-17 (×2): 2 mL

## 2021-12-17 MED ORDER — HYDRALAZINE HCL 20 MG/ML IJ SOLN: 10.0000 mg | INTRAMUSCULAR | Status: AC | PRN

## 2021-12-17 MED ORDER — HEPARIN SODIUM (PORCINE) 1000 UNIT/ML IJ SOLN
INTRAMUSCULAR | Status: AC
  Filled 2021-12-17: qty 10

## 2021-12-17 MED ORDER — LABETALOL HCL 5 MG/ML IV SOLN: 10.0000 mg | INTRAVENOUS | Status: AC | PRN

## 2021-12-17 SURGICAL SUPPLY — 16 items
BAND CMPR LRG ZPHR (HEMOSTASIS) ×1
BAND ZEPHYR COMPRESS 30 LONG (HEMOSTASIS) IMPLANT
CATH BALLN WEDGE 5F 110CM (CATHETERS) IMPLANT
CATH INFINITI 5FR JL4 (CATHETERS) IMPLANT
CATH INFINITI JR4 5F (CATHETERS) IMPLANT
CATH OPTITORQUE TIG 4.0 5F (CATHETERS) IMPLANT
ELECT DEFIB PAD ADLT CADENCE (PAD) IMPLANT
GLIDESHEATH SLEND SS 6F .021 (SHEATH) IMPLANT
GUIDEWIRE INQWIRE 1.5J.035X260 (WIRE) IMPLANT
INQWIRE 1.5J .035X260CM (WIRE) ×1
KIT HEART LEFT (KITS) ×2 IMPLANT
PACK CARDIAC CATHETERIZATION (CUSTOM PROCEDURE TRAY) ×2 IMPLANT
SHEATH GLIDE SLENDER 4/5FR (SHEATH) IMPLANT
SYR MEDRAD MARK 7 150ML (SYRINGE) ×2 IMPLANT
TRANSDUCER W/STOPCOCK (MISCELLANEOUS) ×2 IMPLANT
TUBING CIL FLEX 10 FLL-RA (TUBING) ×2 IMPLANT

## 2021-12-17 NOTE — Progress Notes (Signed)
ANTICOAGULATION CONSULT NOTE - Initial Consult  Pharmacy Consult for Warfarin/Lovenox Indication:  afib, CAD  Allergies  Allergen Reactions   Statins Other (See Comments)    "Muscles were messed up"    Penicillins Other (See Comments)    Childhood reaction. Unknown per Pt, cannot remember ever trying beta lactams     Patient Measurements: Height: '5\' 10"'$  (177.8 cm) Weight: 115.9 kg (255 lb 8 oz) IBW/kg (Calculated) : 73  Vital Signs: Temp: 99.2 F (37.3 C) (10/13 0755) Temp Source: Oral (10/13 0755) BP: 157/94 (10/13 1020) Pulse Rate: 71 (10/13 1020)  Labs: Recent Labs    12/14/21 2140 12/14/21 2343 12/15/21 0420 12/15/21 1330 12/16/21 0104 12/16/21 1934 12/17/21 0218 12/17/21 1635 12/17/21 1646  HGB 12.8*  --  12.4*  --  11.9*  --  12.3* 12.2* 12.2*  HCT 38.3*  --  36.8*  --  33.6*  --  36.7* 36.0* 36.0*  PLT 186  --  173  --  163  --  187  --   --   APTT 43*  --   --   --   --   --   --   --   --   LABPROT  --    < > 32.6*  --  34.1* 20.7* 17.8*  --   --   INR  --    < > 3.2*  --  3.4* 1.8* 1.5*  --   --   CREATININE 0.86  --  0.81  --  0.96  --   --   --   --   CKTOTAL  --   --  44*  --   --   --   --   --   --   TROPONINIHS 21*   < > 196* 334* 204*  --   --   --   --    < > = values in this interval not displayed.    Estimated Creatinine Clearance: 90 mL/min (by C-G formula based on SCr of 0.96 mg/dL).   Medical History: Past Medical History:  Diagnosis Date   Adenomatous colon polyp 07/2003   Heart valve problem    Dr [name]   Hypertension    Mitral valve disorders(424.0)    Nonrheumatic aortic valve stenosis 01/14/2021   Echo 9/22: EF 60-65, small pericardial effusion, mild AS (mean gradient 16 mmHg), trivial AI, mild MR (MR is eccentric - may be underestimated) // Echo 10/2021: EF 60-65, mod calcification of AV, trivial AI, mod-severe AS (DI 0.25, V-max 350 cm/s, mean 28.8), no RWMA, NL RVSF, severe LAE, mild RAE, small effusion w/o tamponade,  ?mild-moderate MR (difficult due to eccentric jet)   Nonrheumatic mitral valve regurgitation 01/14/2021   Echo 10/2021: EF 60-65, moderate calcification of the aortic valve, trivial AI, moderate to severe aortic stenosis (DI 0.25, V-max 350 cm/s, mean gradient 28.8), no RWMA, normal RVSF, severe LAE, mild RAE, small pericardial effusion without tamponade, suspect mild-moderate MR (difficult to qualify degree due to eccentric jet with coanda affect)   Obesity    OSA (obstructive sleep apnea)    cpap   Other and unspecified hyperlipidemia    Permanent atrial fibrillation ([place])    [facility] Monitor 5/22: AFib - Avg HR 55; slowest 32; longest pause 3.9 sec; occ PVCs, 4 beat NSVT   Sleep apnea     Medications:  Medications Prior to Admission  Medication Sig Dispense Refill Last Dose   acetaminophen (TYLENOL) 325 MG tablet Take  650 mg by mouth daily as needed (pain).   3 months   amLODipine (NORVASC) 10 MG tablet Take 1 tablet (10 mg total) by mouth daily. 90 tablet 3 12/14/2021   carvedilol (COREG) 12.5 MG tablet TAKE 1 AND 1/2 TABLET BY MOUTH TWO TIMES A DAY (Patient taking differently: Take 18.75 mg by mouth 2 (two) times daily with a meal.) 270 tablet 3 12/14/2021 at 1815-1830   cloNIDine (CATAPRES) 0.2 MG tablet Take 1 tablet (0.2 mg total) by mouth 2 (two) times daily. 180 tablet 3 12/14/2021   doxazosin (CARDURA) 4 MG tablet Take 1 tablet (4 mg total) by mouth daily. 90 tablet 3 12/14/2021   ezetimibe (ZETIA) 10 MG tablet Take 1 tablet (10 mg total) by mouth daily. 90 tablet 3 12/14/2021   hydrochlorothiazide (MICROZIDE) 12.5 MG capsule Take 1 capsule (12.5 mg total) by mouth daily. (Patient taking differently: Take 12.5 mg by mouth every evening.) 90 capsule 3 12/14/2021   losartan (COZAAR) 100 MG tablet Take 1 tablet (100 mg total) by mouth daily. 90 tablet 3 12/14/2021   lubiprostone (AMITIZA) 24 MCG capsule Take 1 capsule (24 mcg total) by mouth 2 (two) times daily. 180 capsule 3 12/14/2021    meloxicam (MOBIC) 15 MG tablet Take 1 tablet (15 mg total) by mouth daily. 90 tablet 3 12/14/2021   OMEGA-3 FATTY ACIDS PO Take 1 capsule by mouth in the morning and at bedtime.   12/14/2021   warfarin (COUMADIN) 5 MG tablet TAKE 1 TO 1 AND 1/2 TABLETS BY MOUTH DAILY AS DIRECTED BY COUMADIN CLINIC (Patient taking differently: Take 7.5 mg by mouth every evening.) 50 tablet 3 12/14/2021 at 1815   Iron, Ferrous Sulfate, 325 (65 Fe) MG TABS Take 325 mg by mouth daily. (Patient not taking: Reported on 12/15/2021) 90 tablet 3 Not Taking   ketoconazole (NIZORAL) 2 % cream Apply 1 application topically 2 (two) times daily as needed for irritation. (Patient not taking: Reported on 12/15/2021) 30 g 2 Completed Course    Assessment: 71 y.o. M presents with CP. S/p cath 10/13 which showed multivessel CAD. Plan for aggressive medical management instead of PCI. Pt on warfarin PTA for afib. Warfarin held for cath since 10/10 and reversed with vit K 2.'5mg'$  10/12 and '5mg'$  10/13. INR 1.5 today. CBC stable. Noted pt received Lovenox '120mg'$  x 1 pre-cath 10/12pm  Home dose: 7.'5mg'$  daily  Goal of Therapy:  INR 2-3, anti-Xa level 0.6-1 (4 hrs post Lovenox) Monitor platelets by anticoagulation protocol: Yes   Plan:  Lovenox '115mg'$  SQ Q12h -starting 10/14 a.m. Warfarin 7.'5mg'$  po tonight Daily INR  Sherlon Handing, PharmD, BCPS Please see amion for complete clinical pharmacist phone list 12/17/2021,6:30 PM

## 2021-12-17 NOTE — Progress Notes (Signed)
PT Cancellation Note  Patient Details Name: [name] MRN: [id number] DOB: 1950/09/30   Cancelled Treatment:    Reason Eval/Treat Not Completed: Medical issues which prohibited therapy  Per note yesterday, no ambulation till after heart cath.  Still awaiting heart cath. Will f/u in afternoon as able. Abran Richard, PT Acute Rehab [facility] [phone]  Karlton Lemon 12/17/2021, 9:52 AM

## 2021-12-17 NOTE — Progress Notes (Signed)
Rounding Note    Patient Name: [name] Date of Encounter: 12/17/2021  [place] Cardiologist: [name], MD   Subjective  Pt comfortable  No CP  No SOB   Inpatient Medications    Scheduled Meds:  amLODipine  10 mg Oral Daily   aspirin EC  81 mg Oral Daily   carvedilol  12.5 mg Oral BID WC   cloNIDine  0.2 mg Oral BID   cyanocobalamin  1,000 mcg Intramuscular Q1500   doxazosin  4 mg Oral Daily   doxycycline  100 mg Oral B01B   folic acid  1 mg Oral Daily   hydrochlorothiazide  12.5 mg Oral Daily   losartan  100 mg Oral Daily   lubiprostone  24 mcg Oral BID WC   sodium chloride flush  3 mL Intravenous Q12H   sodium chloride flush  3 mL Intravenous Q12H   Continuous Infusions:  sodium chloride     sodium chloride 50 mL/hr at 12/17/21 0804   PRN Meds: sodium chloride, acetaminophen **OR** acetaminophen, liver oil-zinc oxide, ondansetron **OR** ondansetron (ZOFRAN) IV, senna-docusate, sodium chloride flush   Vital Signs    Vitals:   12/17/21 0000 12/17/21 0320 12/17/21 0400 12/17/21 0755  BP: 139/79 (!) 145/95 (!) 151/96 (!) 151/88  Pulse: 76 (!) 59 70 77  Resp: 17 (!) 21 18 (!) 25  Temp:  98.1 F (36.7 C)    TempSrc:  Oral    SpO2: 95% 95% 95% 95%  Weight:  115.9 kg    Height:        Intake/Output Summary (Last 24 hours) at 12/17/2021 0852 Last data filed at 12/17/2021 0500 Gross per 24 hour  Intake 540 ml  Output 1200 ml  Net -660 ml      12/17/2021    3:20 AM 12/15/2021   11:02 PM 10/27/2021    9:39 AM  Last 3 Weights  Weight (lbs) 255 lb 8 oz 260 lb 6.4 oz 263 lb  Weight (kg) 115.894 kg 118.117 kg 119.296 kg      Telemetry    Afib 70 to 80-s - Personally Reviewed  ECG     - Personally Reviewed  Physical Exam   GEN: No acute distress. Laying flat   Neck: Neck is full  Cardiac: Irreg irreg   III/VI systolic murmur base  No S3    Respiratory: Clear to auscultation bilaterally. GI: Soft, nontender, non-distended  MS:  No edema; No deformity. Neuro:  Nonfocal  Psych: Normal affect   Labs    High Sensitivity Troponin:   Recent Labs  Lab 12/14/21 2140 12/14/21 2343 12/15/21 0420 12/15/21 1330 12/16/21 0104  TROPONINIHS 21* 51* 196* 334* 204*     Chemistry Recent Labs  Lab 12/14/21 2140 12/15/21 0420 12/16/21 0104  NA 137 134* 137  K 4.0 3.7 3.6  CL 103 102 104  CO2 '25 22 24  '$ GLUCOSE 147* 126* 109*  BUN '20 17 18  '$ CREATININE 0.86 0.81 0.96  CALCIUM 10.6* 10.4* 10.6*  PROT 6.5  --   --   ALBUMIN 3.4*  --   --   AST 22  --   --   ALT 20  --   --   ALKPHOS 82  --   --   BILITOT 0.9  --   --   GFRNONAA >60 >60 >60  ANIONGAP '9 10 9    '$ Lipids No results for input(s): "CHOL", "TRIG", "HDL", "LABVLDL", "LDLCALC", "CHOLHDL" in the last  168 hours.  Hematology Recent Labs  Lab 12/15/21 0420 12/16/21 0104 12/17/21 0218  WBC 13.8* 11.3* 8.5  RBC 4.02* 3.76* 4.01*  HGB 12.4* 11.9* 12.3*  HCT 36.8* 33.6* 36.7*  MCV 91.5 89.4 91.5  MCH 30.8 31.6 30.7  MCHC 33.7 35.4 33.5  RDW 14.5 14.4 14.3  PLT 173 163 187   Thyroid  Recent Labs  Lab 12/15/21 0420  TSH 0.579    BNPNo results for input(s): "BNP", "PROBNP" in the last 168 hours.  DDimer No results for input(s): "DDIMER" in the last 168 hours.   Radiology    ECHOCARDIOGRAM COMPLETE  Result Date: 12/15/2021    ECHOCARDIOGRAM REPORT   Patient Name:   [name] Date of Exam: 12/15/2021 Medical Rec #:  [id number]    Height:       70.0 in Accession #:    [id number]   Weight:       263.0 lb Date of Birth:  1950/03/22    BSA:          2.345 m Patient Age:    71 years     BP:           156/73 mmHg Patient Gender: M            HR:           74 bpm. Exam Location:  Inpatient Procedure: 2D Echo, Cardiac Doppler, Color Doppler and Intracardiac            Opacification Agent Indications:    Chest pain, elevated troponin, aortic stenosis  History:        Patient has prior history of Echocardiogram examinations. Aortic                 Valve  Disease and Mitral Valve Disease, Arrythmias:Atrial                 Fibrillation and Bradycardia; Risk Factors:Family History of                 Coronary Artery Disease, Hypertension, Dyslipidemia, Sleep Apnea                 and Obesity.  Sonographer:    Eartha Inch Referring Phys: [id number] TIMOTHY S OPYD  Sonographer Comments: Technically difficult study due to poor echo windows. Image acquisition challenging due to patient body habitus and Image acquisition challenging due to respiratory motion. IMPRESSIONS  1. Left ventricular ejection fraction, by estimation, is 55 to 60%. The left ventricle has normal function. The left ventricle has no regional wall motion abnormalities. Left ventricular diastolic parameters are indeterminate.  2. Right ventricular systolic function is normal. The right ventricular size is normal. Tricuspid regurgitation signal is inadequate for assessing PA pressure.  3. Left atrial size was moderately dilated.  4. The mitral valve was not well visualized. Trivial mitral valve regurgitation (MR was reported as worse on last echo but mitral valve poorly visualized on this echo). No evidence of mitral stenosis.  5. The aortic valve is tricuspid. There is moderate calcification of the aortic valve. Aortic valve regurgitation is mild. Moderate aortic valve stenosis. Aortic valve mean gradient measures 25.0 mmHg. Calculated AVA 1.5 cm^2.  6. Aortic dilatation noted. There is mild dilatation of the aortic root and of the ascending aorta, measuring 40 mm.  7. The inferior vena cava is dilated in size with >50% respiratory variability, suggesting right atrial pressure of 8 mmHg.  8. The patient was in atrial fibrillation. FINDINGS  Left Ventricle: Left ventricular ejection fraction, by estimation, is 55 to 60%. The left ventricle has normal function. The left ventricle has no regional wall motion abnormalities. The left ventricular internal cavity size was normal in size. There is  no left  ventricular hypertrophy. Left ventricular diastolic parameters are indeterminate. Right Ventricle: The right ventricular size is normal. No increase in right ventricular wall thickness. Right ventricular systolic function is normal. Tricuspid regurgitation signal is inadequate for assessing PA pressure. Left Atrium: Left atrial size was moderately dilated. Right Atrium: Right atrial size was normal in size. Pericardium: Trivial pericardial effusion is present. Mitral Valve: The mitral valve was not well visualized. Trivial mitral valve regurgitation. No evidence of mitral valve stenosis. MV peak gradient, 5.6 mmHg. The mean mitral valve gradient is 2.5 mmHg. Tricuspid Valve: The tricuspid valve is normal in structure. Tricuspid valve regurgitation is not demonstrated. Aortic Valve: The aortic valve is tricuspid. There is moderate calcification of the aortic valve. Aortic valve regurgitation is mild. Moderate aortic stenosis is present. Aortic valve mean gradient measures 25.0 mmHg. Aortic valve peak gradient measures 36.4 mmHg. Aortic valve area, by VTI measures 1.97 cm. Pulmonic Valve: The pulmonic valve was normal in structure. Pulmonic valve regurgitation is not visualized. Aorta: Aortic dilatation noted. There is mild dilatation of the aortic root and of the ascending aorta, measuring 40 mm. Venous: The inferior vena cava is dilated in size with greater than 50% respiratory variability, suggesting right atrial pressure of 8 mmHg. IAS/Shunts: No atrial level shunt detected by color flow Doppler.  LEFT VENTRICLE PLAX 2D LVIDd:         4.70 cm   Diastology LVIDs:         3.00 cm   LV e' medial:    7.07 cm/s LV PW:         1.00 cm   LV E/e' medial:  18.7 LV IVS:        1.00 cm   LV e' lateral:   10.70 cm/s LVOT diam:     2.30 cm   LV E/e' lateral: 12.3 LV SV:         110 LV SV Index:   47 LVOT Area:     4.15 cm  RIGHT VENTRICLE             IVC RV S prime:     13.40 cm/s  IVC diam: 2.30 cm TAPSE (M-mode): 1.8 cm  LEFT ATRIUM              Index        RIGHT ATRIUM           Index LA diam:        5.60 cm  2.39 cm/m   RA Area:     15.60 cm LA Vol (A2C):   102.0 ml 43.50 ml/m  RA Volume:   37.90 ml  16.16 ml/m LA Vol (A4C):   108.0 ml 46.06 ml/m LA Biplane Vol: 107.0 ml 45.63 ml/m  AORTIC VALVE AV Area (Vmax):    1.76 cm AV Area (Vmean):   1.96 cm AV Area (VTI):     1.97 cm AV Vmax:           301.60 cm/s AV Vmean:          194.667 cm/s AV VTI:            0.557 m AV Peak Grad:      36.4 mmHg AV Mean Grad:  25.0 mmHg LVOT Vmax:         128.00 cm/s LVOT Vmean:        91.800 cm/s LVOT VTI:          0.264 m LVOT/AV VTI ratio: 0.47  AORTA Ao Root diam: 3.90 cm Ao Asc diam:  4.00 cm MITRAL VALVE MV Area (PHT): 4.01 cm     SHUNTS MV Area VTI:   3.71 cm     Systemic VTI:  0.26 m MV Peak grad:  5.6 mmHg     Systemic Diam: 2.30 cm MV Mean grad:  2.5 mmHg MV Vmax:       1.18 m/s MV Vmean:      73.3 cm/s MV Decel Time: 189 msec MV E velocity: 132.00 cm/s Dalton McleanMD Electronically signed by Franki Monte Signature Date/Time: 12/15/2021/1:49:55 PM    Final     Cardiac Studies   Echo  12/15/21 Left ventricular ejection fraction, by estimation, is 55 to 60%. The left ventricle has normal function. The left ventricle has no regional wall motion abnormalities. Left ventricular diastolic parameters are indeterminate. 1. Right ventricular systolic function is normal. The right ventricular size is normal. Tricuspid regurgitation signal is inadequate for assessing PA pressure. 2. 3. Left atrial size was moderately dilated. The mitral valve was not well visualized. Trivial mitral valve regurgitation (MR was reported as worse on last echo but mitral valve poorly visualized on this echo). No evidence of mitral stenosis. 4. The aortic valve is tricuspid. There is moderate calcification of the aortic valve. Aortic valve regurgitation is mild. Moderate aortic valve stenosis. Aortic valve mean gradient measures 25.0  mmHg. Calculated AVA 1.5 cm^2. 5. Aortic dilatation noted. There is mild dilatation of the aortic root and of the ascending aorta, measuring 40 mm. 6. The inferior vena cava is dilated in size with >50% respiratory variability, suggesting right atrial pressure of 8 mmHg. 7. 8. The patient was in atrial fibrillation.  Patient Profile     71 y.o. male hx of DJD, obestiy, permanent atrial fibrillation, MR, AS who was admitted for weakness and indigestion     Mild elevation of troponin    Assessment & Plan    1   CAD  Pt with indigestion, weakness.   Mild elevation troponin CAD on CT    For R and L heart cath today          2  Aortic stenosis   Echo  this admit shows Moderate AS  Mean grad 25  Mild MR  3  Mitral regurg   Appears mild on echo but eccentric   R haert cath to confirm pressures, r/o V wave    3  Atrial fib   Permanent   Rate control  has been on coumadin  Held    Got lovenox last night  4  HL  Cant tolerate statins    Will need to discuss repatha after cath.  5.HTN  BP high this am   Has been controlled    Follow    For questions or updates, please contact [place] Please consult [email or web address] for contact info under        Signed, [name], MD  12/17/2021, 8:52 AM

## 2021-12-17 NOTE — Evaluation (Signed)
Physical Therapy Evaluation Patient Details Name: [name] MRN: [id number] DOB: 1950/10/13 Today's Date: 12/17/2021  History of Present Illness  Pt is 71 yo male admitted on 12/14/21 with indigestion, weakness, elevated troponin.  Pt scheduled for heart cath 12/17/21. Pt with hx of HTN, aortic stenosis, afib.  Clinical Impression  Pt admitted with above diagnosis. Had initially planned to see pt after heart cath but not scheduled until 15:00 and then would have bedrest and would not be able to see PT today.  Pt already in the chair , so complete evaluation, just held gait per cardiologist request.  Normally, pt lives alone with nephew checking on him frequently.  He has been able to do ADLs , light IADLs, and ambulates with RW.  Today, pt fatigued very easily.  He required mod A to stand, min A to step pivot and cues.  All VSS during session.  Pt would like to work toward return home, but understands he may need more help.  He wants to see how he does after heart cath.  Discussed at this time PT recommends further therapy prior to return home. Pt currently with functional limitations due to the deficits listed below (see PT Problem List). Pt will benefit from skilled PT to increase their independence and safety with mobility to allow discharge to the venue listed below.          Recommendations for follow up therapy are one component of a multi-disciplinary discharge planning process, led by the attending physician.  Recommendations may be updated based on patient status, additional functional criteria and insurance authorization.  Follow Up Recommendations Acute inpatient rehab (3hours/day)      Assistance Recommended at Discharge Frequent or constant Supervision/Assistance  Patient can return home with the following  A lot of help with walking and/or transfers;A lot of help with bathing/dressing/bathroom;Assistance with cooking/housework;Help with stairs or ramp for entrance    Equipment  Recommendations None recommended by PT  Recommendations for Other Services  Rehab consult    Functional Status Assessment Patient has had a recent decline in their functional status and demonstrates the ability to make significant improvements in function in a reasonable and predictable amount of time.     Precautions / Restrictions Precautions Precautions: Fall      Mobility  Bed Mobility Overal bed mobility: Needs Assistance Bed Mobility: Sit to Supine       Sit to supine: Min assist   General bed mobility comments: min A for legs and increased time; use of bed rails to reposition    Transfers Overall transfer level: Needs assistance Equipment used: Rolling walker (2 wheels) Transfers: Sit to/from Stand, Bed to chair/wheelchair/BSC Sit to Stand: Mod assist   Step pivot transfers: Min assist       General transfer comment: Mod A and cues to stand from recliner; Min A to step pivot 3' to bed.  For step pivot , pt likes to turn RW backward and hold bar with help to stabilize RW, cues for safety, assist with RW, cues to get close to [facility]    Ambulation/Gait                  Stairs            Wheelchair Mobility    Modified Rankin (Stroke Patients Only)       Balance Overall balance assessment: Needs assistance Sitting-balance support: No upper extremity supported Sitting balance-Leahy Scale: Fair     Standing balance support: Bilateral upper  extremity supported, Reliant on assistive device for balance Standing balance-Leahy Scale: Poor                               Pertinent Vitals/Pain Pain Assessment Pain Assessment: No/denies pain    Home Living Family/patient expects to be discharged to:: Private residence Living Arrangements: Alone Available Help at Discharge: Family;Available PRN/intermittently Type of Home: Apartment Home Access: Level entry       Home Layout: One level Home Equipment: Conservation officer, nature (2  wheels);Toilet riser      Prior Function Prior Level of Function : Independent/Modified Independent             Mobility Comments: Pt could ambulate short community distances with RW ADLs Comments: Pt reports independent with light IADLs and ADLs; does sponge baths; has groceries delievered; nephew checks on him frequently     Hand Dominance        Extremity/Trunk Assessment   Upper Extremity Assessment Upper Extremity Assessment: RUE deficits/detail;LUE deficits/detail RUE Deficits / Details: ROM WFL; MMT 4/5 LUE Deficits / Details: ROM WFL; MMT 4/5    Lower Extremity Assessment Lower Extremity Assessment: RLE deficits/detail;LLE deficits/detail RLE Deficits / Details: ROM WFL; MMT 4/5 LLE Deficits / Details: ROM WFL; MMT 4/5    Cervical / Trunk Assessment Cervical / Trunk Assessment: Kyphotic  Communication   Communication: No difficulties  Cognition Arousal/Alertness: Awake/alert Behavior During Therapy: WFL for tasks assessed/performed Overall Cognitive Status: Within Functional Limits for tasks assessed                                          General Comments General comments (skin integrity, edema, etc.): VSS but did faitgue easily and reports got dizzy when he got up earlier but not now.  BP was stable    Exercises     Assessment/Plan    PT Assessment Patient needs continued PT services  PT Problem List Decreased strength;Decreased mobility;Decreased safety awareness;Decreased range of motion;Decreased activity tolerance;Cardiopulmonary status limiting activity;Decreased balance;Decreased knowledge of use of DME       PT Treatment Interventions DME instruction;Therapeutic activities;Gait training;Therapeutic exercise;Patient/family education;Stair training;Balance training;Functional mobility training    PT Goals (Current goals can be found in the Care Plan section)  Acute Rehab PT Goals Patient Stated Goal: return home if able PT  Goal Formulation: With patient/family Time For Goal Achievement: 12/31/21 Potential to Achieve Goals: Good    Frequency Min 3X/week     Co-evaluation               AM-PAC PT "6 Clicks" Mobility  Outcome Measure Help needed turning from your back to your side while in a flat bed without using bedrails?: A Lot Help needed moving from lying on your back to sitting on the side of a flat bed without using bedrails?: A Lot Help needed moving to and from a bed to a chair (including a wheelchair)?: A Little Help needed standing up from a chair using your arms (e.g., wheelchair or bedside chair)?: A Lot Help needed to walk in hospital room?: Total Help needed climbing 3-5 steps with a railing? : Total 6 Click Score: 11    End of Session Equipment Utilized During Treatment: Gait belt Activity Tolerance: Patient limited by fatigue;Other (comment) (Per cardiology, no amublation till cath) Patient left: in bed;with call bell/phone within reach;with  bed alarm set;with family/visitor present Nurse Communication: Mobility status PT Visit Diagnosis: Other abnormalities of gait and mobility (R26.89);Muscle weakness (generalized) (M62.81)    Time: 6151-8343 PT Time Calculation (min) (ACUTE ONLY): 24 min   Charges:   PT Evaluation $PT Eval Low Complexity: 1 Low PT Treatments $Therapeutic Activity: 8-22 mins        Abran Richard, PT Acute Rehab [facility] [phone]   Karlton Lemon 12/17/2021, 1:36 PM

## 2021-12-17 NOTE — Progress Notes (Signed)
Inpatient Rehab Admissions Coordinator:   Per therapy recommendations,  patient was screened for CIR candidacy by Clemens Catholic, MS, CCC-SLP. At this time, Pt is OBS status and it is not clear that he meets criteria for inpatient admission. I will not place consult at this time but will rescreen if Pt.'s status is switched to inpatient. Please contact me with any questions.   Clemens Catholic, [facility] [place] Admissions Coordinator  [phone] ([place]) [phone] (office)

## 2021-12-17 NOTE — Progress Notes (Signed)
Patient still refusing CPAP. No unit in room. Order D/Cd.

## 2021-12-17 NOTE — Progress Notes (Signed)
PROGRESS NOTE    [name]  NLG:[id number] DOB: 1950-08-18 DOA: 12/14/2021 PCP: [name], MD   Brief Narrative: [name] is a 71 y.o. male wit a history of hypertension, aortic stenosis and atrial fibrillation on Coumadin who presented secondary weakness and dyspnea on exertion. Associated elevated troponin. Cardiology consulted.   Assessment and Plan:  Generalized weakness Dyspnea on exertion Nonspecific. Possibly related to bradycardia vs ACS vs infection vs aortic stenosis. Troponin elevated to 196 this morning. No symptoms at rest. Heart rate as low as 40s while on Coreg. EKG without strong evidence for ACS. -Cardiology consult for bradycardia and elevated troponin and plan LHC   Elevated troponin Hs Troponin trend of 21 > 51 > 196 > 334 > 204. No chest pain since admission but continues to have exertional dyspnea. Cardiology consulted as mentioned above and are recommending a LHC. Coumadin held.   Abdominal wall dermatitis Distribution seems more consistent with intertrigo than with cellulitis. Clinically, patient does have a mildly elevated WBC but no fever, tenderness or warmth of the area. Dermatitis is confined to skin folds of lower panus and inguinal areas. Patient started on antibiotics; will continue for no more than 5 days -Continue doxycycline -Keep area clean and dry -Barrier cream   Vitamin B12 deficiency Folate deficiency Vitamin B12 level of 74 and folate of 5. -Vitamin Y81 and folic acid supplementation   Aortic stenosis Transthoracic Echocardiogram significant for moderate stenosis with mild regurgitation.   Atrial fibrillation -Coumadin held -Coreg   Primary hypertension -Continue Coreg, amlodipine, losartan hydrochlorothiazide  OSA -CPAP qHS   DVT prophylaxis: Coumadin (on hold) Code Status:   Code Status: Full Code Family Communication: None at bedside Disposition Plan: Discharge pending cardiology  recommendations/management   Consultants:  Cardiology  Procedures:  Transthoracic Echocardiogram  Antimicrobials: Doxycycline Bactrim    Subjective: Patient reports no issues overnight. Did not get up to the chair yesterday but did sit on the commode.  Objective: BP (!) 157/94 (BP Location: Left Arm)   Pulse 71   Temp 99.2 F (37.3 C) (Oral)   Resp 16   Ht '5\' 10"'$  (1.778 m)   Wt 115.9 kg   SpO2 95%   BMI 36.66 kg/m   Examination:  General exam: Appears calm and comfortable Respiratory system: Clear to auscultation. Respiratory effort normal. Cardiovascular system: S1 & S2 heard, RRR. No murmurs, rubs, gallops or clicks. Gastrointestinal system: Abdomen is soft and nontender. Normal bowel sounds heard. Central nervous system: Alert and oriented. No focal neurological deficits. Musculoskeletal:  No calf tenderness Skin: No cyanosis. No rashes Psychiatry: Judgement and insight appear normal. Mood & affect appropriate.    Data Reviewed: I have personally reviewed following labs and imaging studies  CBC Lab Results  Component Value Date   WBC 8.5 12/17/2021   RBC 4.01 (L) 12/17/2021   HGB 12.3 (L) 12/17/2021   HCT 36.7 (L) 12/17/2021   MCV 91.5 12/17/2021   MCH 30.7 12/17/2021   PLT 187 12/17/2021   MCHC 33.5 12/17/2021   RDW 14.3 12/17/2021   LYMPHSABS 0.5 (L) 12/14/2021   MONOABS 0.7 12/14/2021   EOSABS 0.0 12/14/2021   BASOSABS 0.0 44/81/8563     Last metabolic panel Lab Results  Component Value Date   NA 137 12/16/2021   K 3.6 12/16/2021   CL 104 12/16/2021   CO2 24 12/16/2021   BUN 18 12/16/2021   CREATININE 0.96 12/16/2021   GLUCOSE 109 (H) 12/16/2021   GFRNONAA >60  12/16/2021   GFRAA 72 04/17/2019   CALCIUM 10.6 (H) 12/16/2021   PROT 6.5 12/14/2021   ALBUMIN 3.4 (L) 12/14/2021   BILITOT 0.9 12/14/2021   ALKPHOS 82 12/14/2021   AST 22 12/14/2021   ALT 20 12/14/2021   ANIONGAP 9 12/16/2021    GFR: Estimated Creatinine Clearance: 90  mL/min (by C-G formula based on SCr of 0.96 mg/dL).  Recent Results (from the past 240 hour(s))  Urine Culture     Status: None   Collection Time: 12/14/21 10:22 PM   Specimen: Urine, Clean Catch  Result Value Ref Range Status   Specimen Description URINE, CLEAN CATCH  Final   Special Requests NONE  Final   Culture   Final    NO GROWTH Performed at [facility], [address], [place], [place] [address]    Report Status 12/16/2021 FINAL  Final  SARS Coronavirus 2 by RT PCR (hospital order, performed in [facility] hospital lab) *cepheid single result test* Anterior Nasal Swab     Status: None   Collection Time: 12/14/21 11:43 PM   Specimen: Anterior Nasal Swab  Result Value Ref Range Status   SARS Coronavirus 2 by RT PCR NEGATIVE NEGATIVE Final    Comment: (NOTE) SARS-CoV-2 target nucleic acids are NOT DETECTED.  The SARS-CoV-2 RNA is generally detectable in upper and lower respiratory specimens during the acute phase of infection. The lowest concentration of SARS-CoV-2 viral copies this assay can detect is 250 copies / mL. A negative result does not preclude SARS-CoV-2 infection and should not be used as the sole basis for treatment or other patient management decisions.  A negative result may occur with improper specimen collection / handling, submission of specimen other than nasopharyngeal swab, presence of viral mutation(s) within the areas targeted by this assay, and inadequate number of viral copies (<250 copies / mL). A negative result must be combined with clinical observations, patient history, and epidemiological information.  Fact Sheet for Patients:   https://www.patel.info/  Fact Sheet for Healthcare Providers: https://Triplett.com/  This test is not yet approved or  cleared by the [place] FDA and has been authorized for detection and/or diagnosis of SARS-CoV-2 by FDA under an Emergency Use Authorization (EUA).   This EUA will remain in effect (meaning this test can be used) for the duration of the COVID-19 declaration under Section 564(b)(1) of the Act, 21 U.S.C. section 360bbb-3(b)(1), unless the authorization is terminated or revoked sooner.  Performed at [facility], [place] [address], [place], [place] [address]   Respiratory (~20 pathogens) panel by PCR     Status: None   Collection Time: 12/15/21  1:30 PM   Specimen: Nasopharyngeal Swab; Respiratory  Result Value Ref Range Status   Adenovirus NOT DETECTED NOT DETECTED Final   Coronavirus 229E NOT DETECTED NOT DETECTED Final    Comment: (NOTE) The Coronavirus on the Respiratory Panel, DOES NOT test for the novel  Coronavirus (2019 nCoV)    Coronavirus HKU1 NOT DETECTED NOT DETECTED Final   Coronavirus NL63 NOT DETECTED NOT DETECTED Final   Coronavirus OC43 NOT DETECTED NOT DETECTED Final   Metapneumovirus NOT DETECTED NOT DETECTED Final   Rhinovirus / Enterovirus NOT DETECTED NOT DETECTED Final   Influenza A NOT DETECTED NOT DETECTED Final   Influenza B NOT DETECTED NOT DETECTED Final   Parainfluenza Virus 1 NOT DETECTED NOT DETECTED Final   Parainfluenza Virus 2 NOT DETECTED NOT DETECTED Final   Parainfluenza Virus 3 NOT DETECTED NOT  DETECTED Final   Parainfluenza Virus 4 NOT DETECTED NOT DETECTED Final   Respiratory Syncytial Virus NOT DETECTED NOT DETECTED Final   Bordetella pertussis NOT DETECTED NOT DETECTED Final   Bordetella Parapertussis NOT DETECTED NOT DETECTED Final   Chlamydophila pneumoniae NOT DETECTED NOT DETECTED Final   Mycoplasma pneumoniae NOT DETECTED NOT DETECTED Final    Comment: Performed at [facility], [place] [address] [place], [place] [address]      Radiology Studies: No results found.    LOS: 0 days    Cordelia Poche, MD [facility] 12/17/2021, 4:27 PM   If 7PM-7AM, please contact night-coverage [email or web address]

## 2021-12-17 NOTE — Interval H&P Note (Signed)
History and Physical Interval Note:  12/17/2021 4:10 PM  [name]  has presented today for surgery, with the diagnosis of possible nstemi with reduced LVEF.  The various methods of treatment have been discussed with the patient and family. After consideration of risks, benefits and other options for treatment, the patient has consented to  Procedure(s): RIGHT/LEFT HEART CATH AND CORONARY ANGIOGRAPHY (N/A) PERCUTANEOUS CORONARY INTERVENTION    as a surgical intervention.  The patient's history has been reviewed, patient examined, no change in status, stable for surgery.  I have reviewed the patient's chart and labs.  Questions were answered to the patient's satisfaction.     Cath Lab Visit (complete for each Cath Lab visit)  Clinical Evaluation Leading to the Procedure:   ACS: Yes.     Glenetta Hew

## 2021-12-17 NOTE — Progress Notes (Signed)
OT Cancellation Note  Patient Details Name: [name] MRN: [id number] DOB: [birth year]   Cancelled Treatment:    Reason Eval/Treat Not Completed: Patient not medically ready (Per chart, cardiology requesting no ambulation until after heart cath.)  Malka So 12/17/2021, 1:18 PM Cleta Alberts, OTR/L [place] Office: [phone]

## 2021-12-17 NOTE — H&P (View-Only) (Signed)
Rounding Note    Patient Name: [name] Date of Encounter: 12/17/2021  [place] Cardiologist: [name], MD   Subjective  Pt comfortable  No CP  No SOB   Inpatient Medications    Scheduled Meds:  amLODipine  10 mg Oral Daily   aspirin EC  81 mg Oral Daily   carvedilol  12.5 mg Oral BID WC   cloNIDine  0.2 mg Oral BID   cyanocobalamin  1,000 mcg Intramuscular Q1500   doxazosin  4 mg Oral Daily   doxycycline  100 mg Oral U98J   folic acid  1 mg Oral Daily   hydrochlorothiazide  12.5 mg Oral Daily   losartan  100 mg Oral Daily   lubiprostone  24 mcg Oral BID WC   sodium chloride flush  3 mL Intravenous Q12H   sodium chloride flush  3 mL Intravenous Q12H   Continuous Infusions:  sodium chloride     sodium chloride 50 mL/hr at 12/17/21 0804   PRN Meds: sodium chloride, acetaminophen **OR** acetaminophen, liver oil-zinc oxide, ondansetron **OR** ondansetron (ZOFRAN) IV, senna-docusate, sodium chloride flush   Vital Signs    Vitals:   12/17/21 0000 12/17/21 0320 12/17/21 0400 12/17/21 0755  BP: 139/79 (!) 145/95 (!) 151/96 (!) 151/88  Pulse: 76 (!) 59 70 77  Resp: 17 (!) 21 18 (!) 25  Temp:  98.1 F (36.7 C)    TempSrc:  Oral    SpO2: 95% 95% 95% 95%  Weight:  115.9 kg    Height:        Intake/Output Summary (Last 24 hours) at 12/17/2021 0852 Last data filed at 12/17/2021 0500 Gross per 24 hour  Intake 540 ml  Output 1200 ml  Net -660 ml      12/17/2021    3:20 AM 12/15/2021   11:02 PM 10/27/2021    9:39 AM  Last 3 Weights  Weight (lbs) 255 lb 8 oz 260 lb 6.4 oz 263 lb  Weight (kg) 115.894 kg 118.117 kg 119.296 kg      Telemetry    Afib 70 to 80-s - Personally Reviewed  ECG     - Personally Reviewed  Physical Exam   GEN: No acute distress. Laying flat   Neck: Neck is full  Cardiac: Irreg irreg   III/VI systolic murmur base  No S3    Respiratory: Clear to auscultation bilaterally. GI: Soft, nontender, non-distended  MS:  No edema; No deformity. Neuro:  Nonfocal  Psych: Normal affect   Labs    High Sensitivity Troponin:   Recent Labs  Lab 12/14/21 2140 12/14/21 2343 12/15/21 0420 12/15/21 1330 12/16/21 0104  TROPONINIHS 21* 51* 196* 334* 204*     Chemistry Recent Labs  Lab 12/14/21 2140 12/15/21 0420 12/16/21 0104  NA 137 134* 137  K 4.0 3.7 3.6  CL 103 102 104  CO2 '25 22 24  '$ GLUCOSE 147* 126* 109*  BUN '20 17 18  '$ CREATININE 0.86 0.81 0.96  CALCIUM 10.6* 10.4* 10.6*  PROT 6.5  --   --   ALBUMIN 3.4*  --   --   AST 22  --   --   ALT 20  --   --   ALKPHOS 82  --   --   BILITOT 0.9  --   --   GFRNONAA >60 >60 >60  ANIONGAP '9 10 9    '$ Lipids No results for input(s): "CHOL", "TRIG", "HDL", "LABVLDL", "LDLCALC", "CHOLHDL" in the last  168 hours.  Hematology Recent Labs  Lab 12/15/21 0420 12/16/21 0104 12/17/21 0218  WBC 13.8* 11.3* 8.5  RBC 4.02* 3.76* 4.01*  HGB 12.4* 11.9* 12.3*  HCT 36.8* 33.6* 36.7*  MCV 91.5 89.4 91.5  MCH 30.8 31.6 30.7  MCHC 33.7 35.4 33.5  RDW 14.5 14.4 14.3  PLT 173 163 187   Thyroid  Recent Labs  Lab 12/15/21 0420  TSH 0.579    BNPNo results for input(s): "BNP", "PROBNP" in the last 168 hours.  DDimer No results for input(s): "DDIMER" in the last 168 hours.   Radiology    ECHOCARDIOGRAM COMPLETE  Result Date: 12/15/2021    ECHOCARDIOGRAM REPORT   Patient Name:   [name] Date of Exam: 12/15/2021 Medical Rec #:  [id number]    Height:       70.0 in Accession #:    [id number]   Weight:       263.0 lb Date of Birth:  November 28, 1950    BSA:          2.345 m Patient Age:    71 years     BP:           156/73 mmHg Patient Gender: M            HR:           74 bpm. Exam Location:  Inpatient Procedure: 2D Echo, Cardiac Doppler, Color Doppler and Intracardiac            Opacification Agent Indications:    Chest pain, elevated troponin, aortic stenosis  History:        Patient has prior history of Echocardiogram examinations. Aortic                 Valve  Disease and Mitral Valve Disease, Arrythmias:Atrial                 Fibrillation and Bradycardia; Risk Factors:Family History of                 Coronary Artery Disease, Hypertension, Dyslipidemia, Sleep Apnea                 and Obesity.  Sonographer:    Eartha Inch Referring Phys: [id number] TIMOTHY S OPYD  Sonographer Comments: Technically difficult study due to poor echo windows. Image acquisition challenging due to patient body habitus and Image acquisition challenging due to respiratory motion. IMPRESSIONS  1. Left ventricular ejection fraction, by estimation, is 55 to 60%. The left ventricle has normal function. The left ventricle has no regional wall motion abnormalities. Left ventricular diastolic parameters are indeterminate.  2. Right ventricular systolic function is normal. The right ventricular size is normal. Tricuspid regurgitation signal is inadequate for assessing PA pressure.  3. Left atrial size was moderately dilated.  4. The mitral valve was not well visualized. Trivial mitral valve regurgitation (MR was reported as worse on last echo but mitral valve poorly visualized on this echo). No evidence of mitral stenosis.  5. The aortic valve is tricuspid. There is moderate calcification of the aortic valve. Aortic valve regurgitation is mild. Moderate aortic valve stenosis. Aortic valve mean gradient measures 25.0 mmHg. Calculated AVA 1.5 cm^2.  6. Aortic dilatation noted. There is mild dilatation of the aortic root and of the ascending aorta, measuring 40 mm.  7. The inferior vena cava is dilated in size with >50% respiratory variability, suggesting right atrial pressure of 8 mmHg.  8. The patient was in atrial fibrillation. FINDINGS  Left Ventricle: Left ventricular ejection fraction, by estimation, is 55 to 60%. The left ventricle has normal function. The left ventricle has no regional wall motion abnormalities. The left ventricular internal cavity size was normal in size. There is  no left  ventricular hypertrophy. Left ventricular diastolic parameters are indeterminate. Right Ventricle: The right ventricular size is normal. No increase in right ventricular wall thickness. Right ventricular systolic function is normal. Tricuspid regurgitation signal is inadequate for assessing PA pressure. Left Atrium: Left atrial size was moderately dilated. Right Atrium: Right atrial size was normal in size. Pericardium: Trivial pericardial effusion is present. Mitral Valve: The mitral valve was not well visualized. Trivial mitral valve regurgitation. No evidence of mitral valve stenosis. MV peak gradient, 5.6 mmHg. The mean mitral valve gradient is 2.5 mmHg. Tricuspid Valve: The tricuspid valve is normal in structure. Tricuspid valve regurgitation is not demonstrated. Aortic Valve: The aortic valve is tricuspid. There is moderate calcification of the aortic valve. Aortic valve regurgitation is mild. Moderate aortic stenosis is present. Aortic valve mean gradient measures 25.0 mmHg. Aortic valve peak gradient measures 36.4 mmHg. Aortic valve area, by VTI measures 1.97 cm. Pulmonic Valve: The pulmonic valve was normal in structure. Pulmonic valve regurgitation is not visualized. Aorta: Aortic dilatation noted. There is mild dilatation of the aortic root and of the ascending aorta, measuring 40 mm. Venous: The inferior vena cava is dilated in size with greater than 50% respiratory variability, suggesting right atrial pressure of 8 mmHg. IAS/Shunts: No atrial level shunt detected by color flow Doppler.  LEFT VENTRICLE PLAX 2D LVIDd:         4.70 cm   Diastology LVIDs:         3.00 cm   LV e' medial:    7.07 cm/s LV PW:         1.00 cm   LV E/e' medial:  18.7 LV IVS:        1.00 cm   LV e' lateral:   10.70 cm/s LVOT diam:     2.30 cm   LV E/e' lateral: 12.3 LV SV:         110 LV SV Index:   47 LVOT Area:     4.15 cm  RIGHT VENTRICLE             IVC RV S prime:     13.40 cm/s  IVC diam: 2.30 cm TAPSE (M-mode): 1.8 cm  LEFT ATRIUM              Index        RIGHT ATRIUM           Index LA diam:        5.60 cm  2.39 cm/m   RA Area:     15.60 cm LA Vol (A2C):   102.0 ml 43.50 ml/m  RA Volume:   37.90 ml  16.16 ml/m LA Vol (A4C):   108.0 ml 46.06 ml/m LA Biplane Vol: 107.0 ml 45.63 ml/m  AORTIC VALVE AV Area (Vmax):    1.76 cm AV Area (Vmean):   1.96 cm AV Area (VTI):     1.97 cm AV Vmax:           301.60 cm/s AV Vmean:          194.667 cm/s AV VTI:            0.557 m AV Peak Grad:      36.4 mmHg AV Mean Grad:  25.0 mmHg LVOT Vmax:         128.00 cm/s LVOT Vmean:        91.800 cm/s LVOT VTI:          0.264 m LVOT/AV VTI ratio: 0.47  AORTA Ao Root diam: 3.90 cm Ao Asc diam:  4.00 cm MITRAL VALVE MV Area (PHT): 4.01 cm     SHUNTS MV Area VTI:   3.71 cm     Systemic VTI:  0.26 m MV Peak grad:  5.6 mmHg     Systemic Diam: 2.30 cm MV Mean grad:  2.5 mmHg MV Vmax:       1.18 m/s MV Vmean:      73.3 cm/s MV Decel Time: 189 msec MV E velocity: 132.00 cm/s Dalton McleanMD Electronically signed by Franki Monte Signature Date/Time: 12/15/2021/1:49:55 PM    Final     Cardiac Studies   Echo  12/15/21 Left ventricular ejection fraction, by estimation, is 55 to 60%. The left ventricle has normal function. The left ventricle has no regional wall motion abnormalities. Left ventricular diastolic parameters are indeterminate. 1. Right ventricular systolic function is normal. The right ventricular size is normal. Tricuspid regurgitation signal is inadequate for assessing PA pressure. 2. 3. Left atrial size was moderately dilated. The mitral valve was not well visualized. Trivial mitral valve regurgitation (MR was reported as worse on last echo but mitral valve poorly visualized on this echo). No evidence of mitral stenosis. 4. The aortic valve is tricuspid. There is moderate calcification of the aortic valve. Aortic valve regurgitation is mild. Moderate aortic valve stenosis. Aortic valve mean gradient measures 25.0  mmHg. Calculated AVA 1.5 cm^2. 5. Aortic dilatation noted. There is mild dilatation of the aortic root and of the ascending aorta, measuring 40 mm. 6. The inferior vena cava is dilated in size with >50% respiratory variability, suggesting right atrial pressure of 8 mmHg. 7. 8. The patient was in atrial fibrillation.  Patient Profile     71 y.o. male hx of DJD, obestiy, permanent atrial fibrillation, MR, AS who was admitted for weakness and indigestion     Mild elevation of troponin    Assessment & Plan    1   CAD  Pt with indigestion, weakness.   Mild elevation troponin CAD on CT    For R and L heart cath today          2  Aortic stenosis   Echo  this admit shows Moderate AS  Mean grad 25  Mild MR  3  Mitral regurg   Appears mild on echo but eccentric   R haert cath to confirm pressures, r/o V wave    3  Atrial fib   Permanent   Rate control  has been on coumadin  Held    Got lovenox last night  4  HL  Cant tolerate statins    Will need to discuss repatha after cath.  5.HTN  BP high this am   Has been controlled    Follow    For questions or updates, please contact [place] Please consult [email or web address] for contact info under        Signed, [name], MD  12/17/2021, 8:52 AM

## 2021-12-17 NOTE — Brief Op Note (Signed)
BRIEF RIGHT & LEFT HEART CATH NOTE  NAME: [name]     MRN: [id number] 12/17/2021 5:42 PM  Primary Care Provider: [name], [place] Cardiologist: [name], MD   PATIENT:  [name] is an obese 71 y.o. male with significant DJD that limits his mobility, he also has permanent A-fib, mild to moderate MR, moderate AS who was admitted for symptoms of weakness and indigestion (no further ingestion.  He had mild troponin elevation concerning for ACS versus demand ischemia).  He was seen by Dr. Harrington Challenger who was referred him for invasive evaluation with right and left heart catheterization.  He had an echocardiogram done earlier today that showed EF 55 to 60%.  No RWMA's.  Indeterminate diastolic pressures.  Trivial MR.  Calcified AOV mean gradient 25 mmHg-moderate AS.  PROCEDURE:  Procedure(s): RIGHT/LEFT HEART CATH AND CORONARY ANGIOGRAPHY (N/A)  SURGEON:  Surgeon(s) and Role: Leonie Man, MD - Primary  PRE-OPERATIVE DIAGNOSIS:  nstemi -> mild troponin elevation  POST-OPERATIVE DIAGNOSIS:  Relatively Normal Right Heart Cath Numbers: RAP mean 9 mmHg, RVP-EDP 37/4-9 mmHg PAP-mean 35/18-26 mmHg; PCWP 19/26 mmHg - 22 mmHg LVEDP-EDP 165/8-18 mmHg; AOP-MAP 150/81-107 mmHg Ao sat 96%, PA sat 65%. ` Cardiac Output 5.94, Cardiac Index 2.56 (mildly reduced Severe multivessel CAD: Small nondominant RCA (<1.5 mm) -100% CTO of the mid vessel Diffusely diseased 40 to 60% LAD from proximal to apical with no obvious focal lesion Large-caliber mostly dominant LCx with 2 major OM branches, 2 PL's and a PDA OM1 has segmental irregular hazy ulcerated appearing likely calcified lesion just proximal to the bifurcation into 2 subbranches (not favorable for PCI, but could be attempted if necessary) OM 2 is CTO flush occlusion (cannot tell if it actually comes off of OM1 or comes off of the LCx as it fills via retrograde filling but not all the way to the parent vessel The AV  groove circumflex after occluded OM2 has an eccentric appearing 70% stenosis then normalizes prior to the final 3 branches (LPL 1 LPL 2 and PDA   PROCEDURE PERFORMED: Time Out: Verified patient identification, verified procedure, site/side was marked, verified correct patient position, special equipment/implants available, medications/allergies/relevent history reviewed, required imaging and test results available. Performed.  Access:  RIGHT Radial Artery: 6 Fr glide sheath -- Seldinger technique using Micropuncture Kit Direct ultrasound guidance used.  Permanent image obtained and placed on chart. 10 mL radial cocktail IA; 5,000 Units IV Heparin * Right Brachial/Antecubital Vein: The existing 18-gauge IV was exchanged over a wire for a 5Fr glide sheath  Right Heart Catheterization: 5 Fr Gordy Councilman catheter advanced under fluoroscopy with balloon inflated to the RA, RV, then PCWP-PA for hemodynamic measurement.  * Simultaneous FA & PA blood gases checked for SaO2% to calculate FICK CO/CI  * Catheter removed completely out of the body with balloon deflated.  Left Heart Catheterization: 5Fr Catheters advanced or exchanged over a J-wire under direct fluoroscopic guidance into the ascending aorta; TIG 4.0 catheter advanced first.  * LV Hemodynamics (no LV Gram): JR4 catheter, valve crossed with standard Inquire safety J-wire * Left Coronary Artery Cineangiography: JL 4 catheter (unable to actively engage with TIG 4.0) * Right Coronary Artery Cineangiography: JR4 catheter (unable to actively engage with TIG 4.0)  Upon completion of Angiogaphy, the catheter was removed completely out of the body over a wire, without complication.   Brachial Sheath(s) removed in the Cath Lab with manual pressure for hemostasis.  Radial sheath removed in the Cardiac Catheterization lab with Zephyr Band placed for hemostasis.  Zephyr Band:Hours; 15 mL air  MEDICATIONS * SQ Lidocaine 66m * Radial Cocktail: 3  mg Verapmil in 10 mL NS * Isovue Contrast: 100 mL * Heparin: 5000 Units  EBL:   Minimal; <20 mL   DICTATION: .Note written in EPIC  PLAN OF CARE:  Return to nursing unit for ongoing care.  Okay to restart heparin or Lovenox in the morning 12/18/2021; can dose warfarin tonight Aggressive guideline directed medical therapy for A-fib, CAD Monitor for signs of concerning symptoms for angina or worsening CHF, would otherwise treat the OM and LCx lesions medically for now. Cath films were reviewed with Dr. JMartiniqueto make decision of attempted PCI versus medical therapy.  His recommendation independent mine was also to recommend medical management based on the the difficulty of intervention of the OM branch which is probably the culprit based on the downstream bifurcation.  If he were to have significant symptoms, this could potentially be attempted but will be difficult.  The LCx does have a lesion which may or may not be flow-limiting.  It could also be attempted, if symptoms warranted.  PATIENT DISPOSITION:  PACU - hemodynamically stable.   Delay start of Pharmacological VTE agent (>24hrs) due to surgical blood loss or risk of bleeding: not applicable   DGlenetta Hew MD

## 2021-12-18 DIAGNOSIS — L03311 Cellulitis of abdominal wall: Secondary | ICD-10-CM | POA: Diagnosis not present

## 2021-12-18 DIAGNOSIS — I350 Nonrheumatic aortic (valve) stenosis: Secondary | ICD-10-CM | POA: Diagnosis not present

## 2021-12-18 DIAGNOSIS — R531 Weakness: Secondary | ICD-10-CM | POA: Diagnosis not present

## 2021-12-18 DIAGNOSIS — R7989 Other specified abnormal findings of blood chemistry: Secondary | ICD-10-CM | POA: Diagnosis not present

## 2021-12-18 LAB — PROTIME-INR
INR: 1.2 (ref 0.8–1.2)
Prothrombin Time: 15.2 s (ref 11.4–15.2)

## 2021-12-18 LAB — CBC
HCT: 36.5 % — ABNORMAL LOW (ref 39.0–52.0)
Hemoglobin: 12.4 g/dL — ABNORMAL LOW (ref 13.0–17.0)
MCH: 30.9 pg (ref 26.0–34.0)
MCHC: 34 g/dL (ref 30.0–36.0)
MCV: 91 fL (ref 80.0–100.0)
Platelets: 204 10*3/uL (ref 150–400)
RBC: 4.01 MIL/uL — ABNORMAL LOW (ref 4.22–5.81)
RDW: 13.8 % (ref 11.5–15.5)
WBC: 6.3 10*3/uL (ref 4.0–10.5)
nRBC: 0 % (ref 0.0–0.2)

## 2021-12-18 MED ORDER — INFLUENZA VAC A&B SA ADJ QUAD 0.5 ML IM PRSY
0.5000 mL | PREFILLED_SYRINGE | INTRAMUSCULAR | Status: DC
  Filled 2021-12-18: qty 0.5

## 2021-12-18 MED ORDER — CARVEDILOL 6.25 MG PO TABS
18.7500 mg | ORAL_TABLET | Freq: Two times a day (BID) | ORAL | Status: DC
  Administered 2021-12-18 – 2021-12-19 (×3): 18.75 mg via ORAL
  Filled 2021-12-18 (×3): qty 1

## 2021-12-18 MED ORDER — POLYETHYLENE GLYCOL 3350 17 G PO PACK
17.0000 g | PACK | Freq: Two times a day (BID) | ORAL | Status: DC
  Administered 2021-12-18 – 2021-12-19 (×2): 17 g via ORAL
  Filled 2021-12-18 (×3): qty 1

## 2021-12-18 MED ORDER — ORAL CARE MOUTH RINSE: 15.0000 mL | OROMUCOSAL | Status: DC | PRN

## 2021-12-18 MED ORDER — WARFARIN SODIUM 7.5 MG PO TABS: 7.5000 mg | ORAL_TABLET | Freq: Once | ORAL | Status: DC

## 2021-12-18 MED ORDER — FUROSEMIDE 10 MG/ML IJ SOLN
60.0000 mg | Freq: Once | INTRAMUSCULAR | Status: AC
  Administered 2021-12-18: 60 mg via INTRAVENOUS
  Filled 2021-12-18: qty 6

## 2021-12-18 NOTE — Progress Notes (Signed)
Rounding Note    Patient Name: [name] Date of Encounter: 12/18/2021  [place] Cardiologist: [name], MD   Subjective  No SOB   No CP   COmplains of constipation  Pt says his BP is better at home   Inpatient Medications    Scheduled Meds:  amLODipine  10 mg Oral Daily   aspirin EC  81 mg Oral Daily   carvedilol  12.5 mg Oral BID WC   cloNIDine  0.2 mg Oral BID   cyanocobalamin  1,000 mcg Intramuscular Q1500   doxazosin  4 mg Oral Daily   doxycycline  100 mg Oral Q12H   enoxaparin (LOVENOX) injection  115 mg Subcutaneous N27P   folic acid  1 mg Oral Daily   hydrochlorothiazide  12.5 mg Oral Daily   [START ON 12/19/2021] influenza vaccine adjuvanted  0.5 mL Intramuscular Tomorrow-1000   losartan  100 mg Oral Daily   lubiprostone  24 mcg Oral BID WC   sodium chloride flush  3 mL Intravenous Q12H   sodium chloride flush  3 mL Intravenous Q12H   Warfarin - Pharmacist Dosing Inpatient   Does not apply q1600   Continuous Infusions:  sodium chloride     PRN Meds: sodium chloride, acetaminophen, liver oil-zinc oxide, ondansetron (ZOFRAN) IV, senna-docusate, sodium chloride flush   Vital Signs    Vitals:   12/17/21 2200 12/17/21 2230 12/17/21 2332 12/18/21 0411  BP: 136/78 (!) 161/83 (!) 139/92 (!) 142/92  Pulse: (!) 47 80 63 62  Resp: '17 12 20 19  '$ Temp:   97.7 F (36.5 C) 97.6 F (36.4 C)  TempSrc:   Oral Oral  SpO2: 92% 95% 95% 91%  Weight:      Height:        Intake/Output Summary (Last 24 hours) at 12/18/2021 0638 Last data filed at 12/17/2021 1900 Gross per 24 hour  Intake 354.25 ml  Output 1800 ml  Net -1445.75 ml      12/17/2021    3:20 AM 12/15/2021   11:02 PM 10/27/2021    9:39 AM  Last 3 Weights  Weight (lbs) 255 lb 8 oz 260 lb 6.4 oz 263 lb  Weight (kg) 115.894 kg 118.117 kg 119.296 kg      Telemetry    Afib 80s - Personally Reviewed  ECG     - Personally Reviewed  Physical Exam   GEN: No acute distress. Laying  flat   Neck: Neck is full  Cardiac: Irreg irreg   III/VI systolic murmur base  No S3    Respiratory: Clear to auscultation bilaterally. GI: Soft, nontender, non-distended  MS: No edema; No deformity.  R wrist   Soft  Bandage dry Neuro:  Nonfocal  Psych: Normal affect   Labs    High Sensitivity Troponin:   Recent Labs  Lab 12/14/21 2140 12/14/21 2343 12/15/21 0420 12/15/21 1330 12/16/21 0104  TROPONINIHS 21* 51* 196* 334* 204*     Chemistry Recent Labs  Lab 12/14/21 2140 12/15/21 0420 12/16/21 0104 12/17/21 1635 12/17/21 1643 12/17/21 1646  NA 137 134* 137 138 139 138  K 4.0 3.7 3.6 3.7 3.7 3.8  CL 103 102 104  --   --   --   CO2 '25 22 24  '$ --   --   --   GLUCOSE 147* 126* 109*  --   --   --   BUN '20 17 18  '$ --   --   --  CREATININE 0.86 0.81 0.96  --   --   --   CALCIUM 10.6* 10.4* 10.6*  --   --   --   PROT 6.5  --   --   --   --   --   ALBUMIN 3.4*  --   --   --   --   --   AST 22  --   --   --   --   --   ALT 20  --   --   --   --   --   ALKPHOS 82  --   --   --   --   --   BILITOT 0.9  --   --   --   --   --   GFRNONAA >60 >60 >60  --   --   --   ANIONGAP '9 10 9  '$ --   --   --     Lipids No results for input(s): "CHOL", "TRIG", "HDL", "LABVLDL", "LDLCALC", "CHOLHDL" in the last 168 hours.  Hematology Recent Labs  Lab 12/16/21 0104 12/17/21 0218 12/17/21 1635 12/17/21 1643 12/17/21 1646 12/18/21 0102  WBC 11.3* 8.5  --   --   --  6.3  RBC 3.76* 4.01*  --   --   --  4.01*  HGB 11.9* 12.3*   < > 11.9* 12.2* 12.4*  HCT 33.6* 36.7*   < > 35.0* 36.0* 36.5*  MCV 89.4 91.5  --   --   --  91.0  MCH 31.6 30.7  --   --   --  30.9  MCHC 35.4 33.5  --   --   --  34.0  RDW 14.4 14.3  --   --   --  13.8  PLT 163 187  --   --   --  204   < > = values in this interval not displayed.   Thyroid  Recent Labs  Lab 12/15/21 0420  TSH 0.579    BNPNo results for input(s): "BNP", "PROBNP" in the last 168 hours.  DDimer No results for input(s): "DDIMER" in the last  168 hours.   Radiology    No results found.  Cardiac Studies   R/L heart cath   12/17/21  Relatively Normal Right Heart Cath Numbers: RAP mean 9 mmHg, RVP-EDP 37/4-9 mmHg PAP-mean 35/18-26 mmHg; PCWP 19/26 mmHg - 22 mmHg LVEDP-EDP 165/8-18 mmHg; AOP-MAP 150/81-107 mmHg Ao sat 96%, PA sat 65%. ` Cardiac Output 5.94, Cardiac Index 2.56 (mildly reduced Severe multivessel CAD: Small nondominant RCA (<1.5 mm) -100% CTO of the mid vessel Diffusely diseased 40 to 60% LAD from proximal to apical with no obvious focal lesion Large-caliber mostly dominant LCx with 2 major OM branches, 2 PL's and a PDA OM1 has segmental irregular hazy ulcerated appearing likely calcified lesion just proximal to the bifurcation into 2 subbranches (not favorable for PCI, but could be attempted if necessary) OM 2 is CTO flush occlusion (cannot tell if it actually comes off of OM1 or comes off of the LCx as it fills via retrograde filling but not all the way to the parent vessel The AV groove circumflex after occluded OM2 has an eccentric appearing 70% stenosis then normalizes prior to the final 3 branches (LPL 1 LPL 2 and PDA  Plan: Aggressive guideline directed medical therapy for A-fib, CAD Monitor for signs of concerning symptoms for angina or worsening CHF, would otherwise treat the OM and LCx lesions medically for  now. Cath films were reviewed with Dr. Martinique to make decision of attempted PCI versus medical therapy.  His recommendation independent mine was also to recommend medical management based on the the difficulty of intervention of the OM branch which is probably the culprit based on the downstream bifurcation.  If he were to have significant symptoms, this could potentially be attempted but will be difficult.  The LCx does have a lesion which may or may not be flow-limiting.  It could also be attempted, if symptoms warranted    Echo  12/15/21 Left ventricular ejection fraction, by estimation, is 55 to  60%. The left ventricle has normal function. The left ventricle has no regional wall motion abnormalities. Left ventricular diastolic parameters are indeterminate. 1. Right ventricular systolic function is normal. The right ventricular size is normal. Tricuspid regurgitation signal is inadequate for assessing PA pressure. 2. 3. Left atrial size was moderately dilated. The mitral valve was not well visualized. Trivial mitral valve regurgitation (MR was reported as worse on last echo but mitral valve poorly visualized on this echo). No evidence of mitral stenosis. 4. The aortic valve is tricuspid. There is moderate calcification of the aortic valve. Aortic valve regurgitation is mild. Moderate aortic valve stenosis. Aortic valve mean gradient measures 25.0 mmHg. Calculated AVA 1.5 cm^2. 5. Aortic dilatation noted. There is mild dilatation of the aortic root and of the ascending aorta, measuring 40 mm. 6. The inferior vena cava is dilated in size with >50% respiratory variability, suggesting right atrial pressure of 8 mmHg. 7. 8. The patient was in atrial fibrillation.  Patient Profile     71 y.o. male hx of DJD, obestiy, permanent atrial fibrillation, MR, AS who was admitted for weakness and indigestion     Mild elevation of troponin    Assessment & Plan    1   CAD  Pt with indigestion, weakness.   Mild elevation troponin CAD on CT   R heart cath   Mld elevation R heart pressures L heart cath with mod LAD dz, RCA small   LCx with occluded OM1,   OM2 with ulcerated lesion    Would be difficult to intervene on.  Distal LCx with 70%   Recomm trial of medical Rx      Keep on ASA         2  Aortic stenosis   Echo  this admit shows Moderate AS  Mean grad 25  Mild MR  3  Mitral regurg   Appears mild on echo but eccentric   3  Atrial fib   Permanent   Rate control  has been on coumadin  Need to resume coumadin    He had not been on Eliquis or Xarelto in past due to cost     4  HL   Cant tolerate statins    Will need to discuss repatha after cath.  Need to get him into an assistance program    5.HTN  BP high this am   Better at home he says    Will increase carvedilol to 25 mg Keep on losartan (ideally entresto, but again cost concerns) Will give 1 dose of lasix as his LVEDP was elevated and he has gotten IVfluids  6   Constipation   This is a big complaint  Will defer to primary team  8  Mobility   This is another very big problem for patient    Will need PT to work with  For questions or updates, please contact [place] Please consult [email or web address] for contact info under        Signed, [name], MD  12/18/2021, 6:38 AM

## 2021-12-18 NOTE — Progress Notes (Signed)
ANTICOAGULATION CONSULT NOTE - Initial Consult  Pharmacy Consult for Warfarin/Lovenox Indication:  afib, CAD  Allergies  Allergen Reactions   Statins Other (See Comments)    "Muscles were messed up"    Penicillins Other (See Comments)    Childhood reaction. Unknown per Pt, cannot remember ever trying beta lactams     Patient Measurements: Height: '5\' 10"'$  (177.8 cm) Weight: 115.9 kg (255 lb 8 oz) IBW/kg (Calculated) : 73  Vital Signs: Temp: 98.5 F (36.9 C) (10/14 1217) Temp Source: Oral (10/14 1217) BP: 121/80 (10/14 1217) Pulse Rate: 73 (10/14 1217)  Labs: Recent Labs    12/16/21 0104 12/16/21 1934 12/17/21 0218 12/17/21 1635 12/17/21 1643 12/17/21 1646 12/18/21 0102  HGB 11.9*  --  12.3*   < > 11.9* 12.2* 12.4*  HCT 33.6*  --  36.7*   < > 35.0* 36.0* 36.5*  PLT 163  --  187  --   --   --  204  LABPROT 34.1* 20.7* 17.8*  --   --   --  15.2  INR 3.4* 1.8* 1.5*  --   --   --  1.2  CREATININE 0.96  --   --   --   --   --   --   TROPONINIHS 204*  --   --   --   --   --   --    < > = values in this interval not displayed.     Estimated Creatinine Clearance: 90 mL/min (by C-G formula based on SCr of 0.96 mg/dL).   Medical History: Past Medical History:  Diagnosis Date   Adenomatous colon polyp 07/2003   Heart valve problem    Dr [name]   Hypertension    Mitral valve disorders(424.0)    Nonrheumatic aortic valve stenosis 01/14/2021   Echo 9/22: EF 60-65, small pericardial effusion, mild AS (mean gradient 16 mmHg), trivial AI, mild MR (MR is eccentric - may be underestimated) // Echo 10/2021: EF 60-65, mod calcification of AV, trivial AI, mod-severe AS (DI 0.25, V-max 350 cm/s, mean 28.8), no RWMA, NL RVSF, severe LAE, mild RAE, small effusion w/o tamponade, ?mild-moderate MR (difficult due to eccentric jet)   Nonrheumatic mitral valve regurgitation 01/14/2021   Echo 10/2021: EF 60-65, moderate calcification of the aortic valve, trivial AI, moderate to severe  aortic stenosis (DI 0.25, V-max 350 cm/s, mean gradient 28.8), no RWMA, normal RVSF, severe LAE, mild RAE, small pericardial effusion without tamponade, suspect mild-moderate MR (difficult to qualify degree due to eccentric jet with coanda affect)   Obesity    OSA (obstructive sleep apnea)    cpap   Other and unspecified hyperlipidemia    Permanent atrial fibrillation ([place])    Zio Monitor 5/22: AFib - Avg HR 55; slowest 32; longest pause 3.9 sec; occ PVCs, 4 beat NSVT   Sleep apnea     Medications:  Medications Prior to Admission  Medication Sig Dispense Refill Last Dose   acetaminophen (TYLENOL) 325 MG tablet Take 650 mg by mouth daily as needed (pain).   3 months   amLODipine (NORVASC) 10 MG tablet Take 1 tablet (10 mg total) by mouth daily. 90 tablet 3 12/14/2021   carvedilol (COREG) 12.5 MG tablet TAKE 1 AND 1/2 TABLET BY MOUTH TWO TIMES A DAY (Patient taking differently: Take 18.75 mg by mouth 2 (two) times daily with a meal.) 270 tablet 3 12/14/2021 at 1815-1830   cloNIDine (CATAPRES) 0.2 MG tablet Take 1 tablet (0.2 mg total)  by mouth 2 (two) times daily. 180 tablet 3 12/14/2021   doxazosin (CARDURA) 4 MG tablet Take 1 tablet (4 mg total) by mouth daily. 90 tablet 3 12/14/2021   ezetimibe (ZETIA) 10 MG tablet Take 1 tablet (10 mg total) by mouth daily. 90 tablet 3 12/14/2021   hydrochlorothiazide (MICROZIDE) 12.5 MG capsule Take 1 capsule (12.5 mg total) by mouth daily. (Patient taking differently: Take 12.5 mg by mouth every evening.) 90 capsule 3 12/14/2021   losartan (COZAAR) 100 MG tablet Take 1 tablet (100 mg total) by mouth daily. 90 tablet 3 12/14/2021   lubiprostone (AMITIZA) 24 MCG capsule Take 1 capsule (24 mcg total) by mouth 2 (two) times daily. 180 capsule 3 12/14/2021   meloxicam (MOBIC) 15 MG tablet Take 1 tablet (15 mg total) by mouth daily. 90 tablet 3 12/14/2021   OMEGA-3 FATTY ACIDS PO Take 1 capsule by mouth in the morning and at bedtime.   12/14/2021   warfarin  (COUMADIN) 5 MG tablet TAKE 1 TO 1 AND 1/2 TABLETS BY MOUTH DAILY AS DIRECTED BY COUMADIN CLINIC (Patient taking differently: Take 7.5 mg by mouth every evening.) 50 tablet 3 12/14/2021 at 1815   Iron, Ferrous Sulfate, 325 (65 Fe) MG TABS Take 325 mg by mouth daily. (Patient not taking: Reported on 12/15/2021) 90 tablet 3 Not Taking   ketoconazole (NIZORAL) 2 % cream Apply 1 application topically 2 (two) times daily as needed for irritation. (Patient not taking: Reported on 12/15/2021) 30 g 2 Completed Course    Assessment: 71 y.o. M presents with CP. S/p cath 10/13 which showed multivessel CAD. Plan for aggressive medical management instead of PCI. Pt on warfarin PTA for afib. Warfarin held for cath since 10/10 and reversed with vit K 2.'5mg'$  10/12 and '5mg'$  10/13. CBC stable. Noted pt received Lovenox '120mg'$  x 1 pre-cath 10/12pm  Home dose: 7.'5mg'$  daily  INR 1.2 today after vitamin k on 10/12. We will repeat coumadin 7.5 today. Will check copay for apixaban again on Monday.  Goal of Therapy:  INR 2-3, anti-Xa level 0.6-1 (4 hrs post Lovenox) Monitor platelets by anticoagulation protocol: Yes   Plan:  Lovenox '115mg'$  SQ Q12h Warfarin 7.'5mg'$  po tonight Daily INR  Onnie Boer, PharmD, [place], AAHIVP, CPP Infectious Disease Pharmacist 12/18/2021 3:15 PM

## 2021-12-18 NOTE — Evaluation (Signed)
Occupational Therapy Evaluation Patient Details Name: [name] MRN: [id number] DOB: Apr 15, 1950 Today's Date: 12/18/2021   History of Present Illness Pt is 71 yo male admitted on 12/14/21 with indigestion, weakness, elevated troponin.  Pt scheduled for heart cath 12/17/21. Pt with hx of HTN, aortic stenosis, afib.   Clinical Impression   Patient admitted for the diagnosis above.  PTA he lives at home alone, and has supportive assist from his nephew.  Generalized weakness and poor activity tolerance are the primary deficits.  OT will follow in the acute setting and SNF is recommended for post acute rehab prior to returning home.        Recommendations for follow up therapy are one component of a multi-disciplinary discharge planning process, led by the attending physician.  Recommendations may be updated based on patient status, additional functional criteria and insurance authorization.   Follow Up Recommendations  Skilled nursing-short term rehab (<3 hours/day)    Assistance Recommended at Discharge Frequent or constant Supervision/Assistance  Patient can return home with the following Assist for transportation;Help with stairs or ramp for entrance;Assistance with cooking/housework;A lot of help with bathing/dressing/bathroom;A lot of help with walking and/or transfers    Functional Status Assessment  Patient has had a recent decline in their functional status and demonstrates the ability to make significant improvements in function in a reasonable and predictable amount of time.  Equipment Recommendations  None recommended by OT    Recommendations for Other Services       Precautions / Restrictions Precautions Precautions: Fall Restrictions Weight Bearing Restrictions: No      Mobility Bed Mobility                    Transfers   Equipment used: Rolling walker (2 wheels) Transfers: Sit to/from Stand, Bed to chair/wheelchair/BSC Sit to Stand: Min assist, From  elevated surface     Step pivot transfers: Min assist            Balance Overall balance assessment: Needs assistance Sitting-balance support: Feet supported, Single extremity supported Sitting balance-Leahy Scale: Good     Standing balance support: Bilateral upper extremity supported, Reliant on assistive device for balance Standing balance-Leahy Scale: Poor                             ADL either performed or assessed with clinical judgement   ADL Overall ADL's : Needs assistance/impaired Eating/Feeding: Independent;Sitting   Grooming: Wash/dry hands;Wash/dry face;Oral care;Min guard;Sitting   Upper Body Bathing: Minimal assistance;Sitting   Lower Body Bathing: Moderate assistance;Sit to/from stand   Upper Body Dressing : Minimal assistance;Sitting   Lower Body Dressing: Moderate assistance;Sit to/from stand   Toilet Transfer: Financial planner (2 wheels)   Toileting- Water quality scientist and Hygiene: Minimal assistance;Sit to/from stand               Vision Baseline Vision/History: 1 Wears glasses Patient Visual Report: No change from baseline       Perception Perception Perception: Within Functional Limits   Praxis Praxis Praxis: Intact    Pertinent Vitals/Pain Pain Assessment Pain Assessment: No/denies pain Pain Intervention(s): Monitored during session     Hand Dominance Right   Extremity/Trunk Assessment Upper Extremity Assessment Upper Extremity Assessment: Generalized weakness   Lower Extremity Assessment Lower Extremity Assessment: Defer to PT evaluation   Cervical / Trunk Assessment Cervical / Trunk Assessment: Kyphotic   Communication Communication Communication: No difficulties   Cognition  Arousal/Alertness: Awake/alert Behavior During Therapy: WFL for tasks assessed/performed Overall Cognitive Status: Within Functional Limits for tasks assessed                                        General Comments   VSS on RA    Exercises     Shoulder Instructions      Home Living Family/patient expects to be discharged to:: Private residence Living Arrangements: Alone Available Help at Discharge: Family;Available PRN/intermittently Type of Home: Apartment Home Access: Level entry     Home Layout: One level     Bathroom Shower/Tub: Teacher, early years/pre: Standard Bathroom Accessibility: Yes How Accessible: Accessible via walker Home Equipment: Rolling Walker (2 wheels);Toilet riser          Prior Functioning/Environment Prior Level of Function : Independent/Modified Independent             Mobility Comments: Pt could ambulate short community distances with RW ADLs Comments: Pt reports independent with light IADLs and ADLs; does sponge baths; has groceries delievered; nephew checks on him frequently and assists with community mobility        OT Problem List: Impaired balance (sitting and/or standing);Decreased activity tolerance;Decreased strength      OT Treatment/Interventions: Self-care/ADL training;Therapeutic activities;Therapeutic exercise;Patient/family education;DME and/or AE instruction;Balance training    OT Goals(Current goals can be found in the care plan section) Acute Rehab OT Goals Patient Stated Goal: Return home OT Goal Formulation: With patient Time For Goal Achievement: 12/31/21 Potential to Achieve Goals: Good ADL Goals Pt Will Perform Grooming: standing;with set-up Pt Will Perform Upper Body Dressing: sitting;Independently Pt Will Perform Lower Body Dressing: with supervision;sit to/from stand;with adaptive equipment Pt Will Transfer to Toilet: with modified independence;ambulating;regular height toilet Pt/caregiver will Perform Home Exercise Program: Increased strength;Both right and left upper extremity;With theraband;With Supervision  OT Frequency: Min 2X/week    Co-evaluation              AM-PAC  OT "6 Clicks" Daily Activity     Outcome Measure Help from another person eating meals?: None Help from another person taking care of personal grooming?: None Help from another person toileting, which includes using toliet, bedpan, or urinal?: A Lot Help from another person bathing (including washing, rinsing, drying)?: A Lot Help from another person to put on and taking off regular upper body clothing?: A Little Help from another person to put on and taking off regular lower body clothing?: A Lot 6 Click Score: 17   End of Session Equipment Utilized During Treatment: Rolling walker (2 wheels) Nurse Communication: Mobility status  Activity Tolerance: Patient tolerated treatment well Patient left: in bed;with call bell/phone within reach;with family/visitor present  OT Visit Diagnosis: Unsteadiness on feet (R26.81);Muscle weakness (generalized) (M62.81)                Time: 1610-9604 OT Time Calculation (min): 28 min Charges:  OT General Charges $OT Visit: 1 Visit OT Evaluation $OT Eval Moderate Complexity: 1 Mod OT Treatments $Self Care/Home Management : 8-22 mins  12/18/2021  RP, OTR/L  Acute Rehabilitation Services  Office:  [phone]   Metta Clines 12/18/2021, 3:02 PM

## 2021-12-18 NOTE — Progress Notes (Signed)
PROGRESS NOTE    [name]  XBM:[id number] DOB: Sep 22, 1950 DOA: 12/14/2021 PCP: [name], MD   Brief Narrative: [name] is a 71 y.o. male wit a history of hypertension, aortic stenosis and atrial fibrillation on Coumadin who presented secondary weakness and dyspnea on exertion. Associated elevated troponin. Cardiology consulted. LHC performed on 10/13 which was significant for moderate disease and recommendation for medical management.   Assessment and Plan:  Generalized weakness Dyspnea on exertion Nonspecific. Possibly related to bradycardia vs ACS vs infection vs aortic stenosis. Troponin elevated to 196 this morning. No symptoms at rest. Heart rate as low as 40s while on Coreg. EKG without strong evidence for ACS. Cardiology consulted and recommended L/RHC, which was performed on 10/13 and significant for severe multivessel CAD. Cardiology recommending medical management rather than PCI secondary to difficulty of intervention of the OM branch.  Severe CAD As mentioned above. -Cardiology recommendations: aspirin   Elevated troponin Hs Troponin trend of 21 > 51 > 196 > 334 > 204. No chest pain since admission but continues to have exertional dyspnea. Cardiology consulted as mentioned above and are recommending a LHC. Coumadin held.   Abdominal wall dermatitis Distribution seems more consistent with intertrigo than with cellulitis. Clinically, patient does have a mildly elevated WBC but no fever, tenderness or warmth of the area. Dermatitis is confined to skin folds of lower panus and inguinal areas. Patient started on antibiotics; will continue for no more than 5 days -Continue doxycycline -Keep area clean and dry -Barrier cream   Vitamin B12 deficiency Folate deficiency Vitamin B12 level of 74 and folate of 5. -Vitamin U27 and folic acid supplementation   Aortic stenosis Transthoracic Echocardiogram significant for moderate stenosis with mild regurgitation.    Permanent atrial fibrillation Coumadin held for heart catheterization and has been resumed. Coreg increased to 18.75 mg BID by cardiology. -Continue Coreg and Coumadin   Primary hypertension -Continue Coreg, amlodipine, losartan, hydrochlorothiazide  Constipation Chronic issue -Continue Amitiza -Add MiraLAX  OSA -CPAP qHS   DVT prophylaxis: Coumadin Code Status:   Code Status: Full Code Family Communication: Nephew at bedside Disposition Plan: Discharge to SNF when bed is available. Medically stable for discharge once cardiology signs off.   Consultants:  Cardiology  Procedures:  Transthoracic Echocardiogram  Antimicrobials: Doxycycline Bactrim    Subjective: Patient reports constipation. No chest pain or dyspnea. Has gotten up to go to the toilet.  Objective: BP 121/80 (BP Location: Left Arm)   Pulse 73   Temp 98.5 F (36.9 C) (Oral)   Resp 18   Ht '5\' 10"'$  (1.778 m)   Wt 115.9 kg   SpO2 93%   BMI 36.66 kg/m   Examination:  General exam: Appears calm and comfortable Respiratory system: Clear to auscultation. Respiratory effort normal. Cardiovascular system: S1 & S2 heard, RRR. 2/6 systolic murmur Gastrointestinal system: Abdomen is nondistended, soft and nontender. Normal bowel sounds heard. Central nervous system: Alert and oriented. No focal neurological deficits. Musculoskeletal: No calf tenderness Skin: No cyanosis. No rashes Psychiatry: Judgement and insight appear normal. Mood & affect appropriate.    Data Reviewed: I have personally reviewed following labs and imaging studies  CBC Lab Results  Component Value Date   WBC 6.3 12/18/2021   RBC 4.01 (L) 12/18/2021   HGB 12.4 (L) 12/18/2021   HCT 36.5 (L) 12/18/2021   MCV 91.0 12/18/2021   MCH 30.9 12/18/2021   PLT 204 12/18/2021   MCHC 34.0 12/18/2021   RDW 13.8  12/18/2021   LYMPHSABS 0.5 (L) 12/14/2021   MONOABS 0.7 12/14/2021   EOSABS 0.0 12/14/2021   BASOSABS 0.0 12/14/2021      Last metabolic panel Lab Results  Component Value Date   NA 138 12/17/2021   K 3.8 12/17/2021   CL 104 12/16/2021   CO2 24 12/16/2021   BUN 18 12/16/2021   CREATININE 0.96 12/16/2021   GLUCOSE 109 (H) 12/16/2021   GFRNONAA >60 12/16/2021   GFRAA 72 04/17/2019   CALCIUM 10.6 (H) 12/16/2021   PROT 6.5 12/14/2021   ALBUMIN 3.4 (L) 12/14/2021   BILITOT 0.9 12/14/2021   ALKPHOS 82 12/14/2021   AST 22 12/14/2021   ALT 20 12/14/2021   ANIONGAP 9 12/16/2021    GFR: Estimated Creatinine Clearance: 90 mL/min (by C-G formula based on SCr of 0.96 mg/dL).  Recent Results (from the past 240 hour(s))  Urine Culture     Status: None   Collection Time: 12/14/21 10:22 PM   Specimen: Urine, Clean Catch  Result Value Ref Range Status   Specimen Description URINE, CLEAN CATCH  Final   Special Requests NONE  Final   Culture   Final    NO GROWTH Performed at [facility], [address] [place], [place] [address]    Report Status 12/16/2021 FINAL  Final  SARS Coronavirus 2 by RT PCR (hospital order, performed in [facility] hospital lab) *cepheid single result test* Anterior Nasal Swab     Status: None   Collection Time: 12/14/21 11:43 PM   Specimen: Anterior Nasal Swab  Result Value Ref Range Status   SARS Coronavirus 2 by RT PCR NEGATIVE NEGATIVE Final    Comment: (NOTE) SARS-CoV-2 target nucleic acids are NOT DETECTED.  The SARS-CoV-2 RNA is generally detectable in upper and lower respiratory specimens during the acute phase of infection. The lowest concentration of SARS-CoV-2 viral copies this assay can detect is 250 copies / mL. A negative result does not preclude SARS-CoV-2 infection and should not be used as the sole basis for treatment or other patient management decisions.  A negative result may occur with improper specimen collection / handling, submission of specimen other than nasopharyngeal swab, presence of viral mutation(s) within the areas targeted by this  assay, and inadequate number of viral copies (<250 copies / mL). A negative result must be combined with clinical observations, patient history, and epidemiological information.  Fact Sheet for Patients:   https://www.patel.info/  Fact Sheet for Healthcare Providers: https://Tarbet.com/  This test is not yet approved or  cleared by the [place] FDA and has been authorized for detection and/or diagnosis of SARS-CoV-2 by FDA under an Emergency Use Authorization (EUA).  This EUA will remain in effect (meaning this test can be used) for the duration of the COVID-19 declaration under Section 564(b)(1) of the Act, 21 U.S.C. section 360bbb-3(b)(1), unless the authorization is terminated or revoked sooner.  Performed at [facility], [place] [address], [place], [place] [address]   Respiratory (~20 pathogens) panel by PCR     Status: None   Collection Time: 12/15/21  1:30 PM   Specimen: Nasopharyngeal Swab; Respiratory  Result Value Ref Range Status   Adenovirus NOT DETECTED NOT DETECTED Final   Coronavirus 229E NOT DETECTED NOT DETECTED Final    Comment: (NOTE) The Coronavirus on the Respiratory Panel, DOES NOT test for the novel  Coronavirus (2019 nCoV)    Coronavirus HKU1 NOT DETECTED NOT DETECTED Final   Coronavirus NL63 NOT DETECTED NOT  DETECTED Final   Coronavirus OC43 NOT DETECTED NOT DETECTED Final   Metapneumovirus NOT DETECTED NOT DETECTED Final   Rhinovirus / Enterovirus NOT DETECTED NOT DETECTED Final   Influenza A NOT DETECTED NOT DETECTED Final   Influenza B NOT DETECTED NOT DETECTED Final   Parainfluenza Virus 1 NOT DETECTED NOT DETECTED Final   Parainfluenza Virus 2 NOT DETECTED NOT DETECTED Final   Parainfluenza Virus 3 NOT DETECTED NOT DETECTED Final   Parainfluenza Virus 4 NOT DETECTED NOT DETECTED Final   Respiratory Syncytial Virus NOT DETECTED NOT DETECTED Final   Bordetella pertussis NOT DETECTED NOT DETECTED  Final   Bordetella Parapertussis NOT DETECTED NOT DETECTED Final   Chlamydophila pneumoniae NOT DETECTED NOT DETECTED Final   Mycoplasma pneumoniae NOT DETECTED NOT DETECTED Final    Comment: Performed at [facility], [place] [address] [place], [place] [address]      Radiology Studies: CARDIAC CATHETERIZATION  Result Date: 12/18/2021   Prox LAD to Dist LAD lesion is 55% stenosed.   Mid RCA to Dist RCA lesion is 100% stenosed with 100% stenosed side branch in Acute Mrg.   1st Mrg lesion is 99% stenosed with 70% stenosed side branch in Lat 1st Mrg.   Dist Cx lesion is 70% stenosed with 100% stenosed side branch in 2nd Mrg.   LV end diastolic pressure is moderately elevated.   Hemodynamic findings consistent with mild pulmonary hypertension.   There is mild aortic valve stenosis. POST-OPERATIVE DIAGNOSIS: Relatively Normal Right Heart Cath Numbers: RAP mean 9 mmHg, RVP-EDP 37/4-9 mmHg PAP-mean 35/18-26 mmHg; PCWP 19/26 mmHg - 22 mmHg LVEDP-EDP 165/8-18 mmHg; AOP-MAP 150/81-107 mmHg Ao sat 96%, PA sat 65%. ` Cardiac Output 5.94, Cardiac Index 2.56 (mildly reduced Severe multivessel CAD: Small nondominant RCA (<1.5 mm) -100% CTO of the mid vessel Diffusely diseased 40 to 60% LAD from proximal to apical with no obvious focal lesion Large-caliber mostly dominant LCx with 2 major OM branches, 2 PL's and a PDA OM1 has segmental irregular hazy ulcerated appearing likely calcified lesion just proximal to the bifurcation into 2 subbranches (not favorable for PCI, but could be attempted if necessary) OM 2 is CTO flush occlusion (cannot tell if it actually comes off of OM1 or comes off of the LCx as it fills via retrograde filling but not all the way to the parent vessel The AV groove circumflex after occluded OM2 has an eccentric appearing 70% stenosis then normalizes prior to the final 3 branches (LPL 1 LPL 2 and PDA PLAN OF CARE:  Return to nursing unit for ongoing care.  Okay to restart heparin or Lovenox in  the morning 12/18/2021; can dose warfarin tonight Aggressive guideline directed medical therapy for A-fib, CAD Monitor for signs of concerning symptoms for angina or worsening CHF, would otherwise treat the OM and LCx lesions medically for now. Cath films were reviewed with Dr. Martinique to make decision of attempted PCI versus medical therapy.  His recommendation independent mine was also to recommend medical management based on the the difficulty of intervention of the OM branch which is probably the culprit based on the downstream bifurcation.  If he were to have significant symptoms, this could potentially be attempted but will be difficult.  The LCx does have a lesion which may or may not be flow-limiting.  It could also be attempted, if symptoms warranted.  Glenetta Hew, MD      LOS: 1 day    Cordelia Poche, MD [facility] 12/18/2021, 2:49 PM  If 7PM-7AM, please contact night-coverage [email or web address]

## 2021-12-18 NOTE — Progress Notes (Signed)
Inpatient Rehab Admissions Coordinator:   Per therapy recommendation, patient was screened for CIR candidacy by Clemens Catholic, MS, CCC-SLP. At this time, Pt. does not appear to demonstrate medical necessity to justify in hospital rehabilitation/CIR. Payor also unlikely to approve CIR for this diagnosis. I will not pursue a rehab consult for this Pt.   Recommend other rehab venues to be pursued.  Please contact me with any questions  Clemens Catholic, [place], [place] Admissions Coordinator  [phone] ([place]) [phone] (office)

## 2021-12-19 ENCOUNTER — Telehealth: Payer: Self-pay | Admitting: Internal Medicine

## 2021-12-19 DIAGNOSIS — R7989 Other specified abnormal findings of blood chemistry: Secondary | ICD-10-CM | POA: Diagnosis not present

## 2021-12-19 DIAGNOSIS — L03311 Cellulitis of abdominal wall: Secondary | ICD-10-CM | POA: Diagnosis not present

## 2021-12-19 DIAGNOSIS — R531 Weakness: Secondary | ICD-10-CM | POA: Diagnosis not present

## 2021-12-19 DIAGNOSIS — I350 Nonrheumatic aortic (valve) stenosis: Secondary | ICD-10-CM | POA: Diagnosis not present

## 2021-12-19 DIAGNOSIS — E785 Hyperlipidemia, unspecified: Secondary | ICD-10-CM

## 2021-12-19 DIAGNOSIS — Z79899 Other long term (current) drug therapy: Secondary | ICD-10-CM

## 2021-12-19 LAB — PROTIME-INR
INR: 1.2 (ref 0.8–1.2)
Prothrombin Time: 15.2 seconds (ref 11.4–15.2)

## 2021-12-19 LAB — LIPOPROTEIN A (LPA): Lipoprotein (a): 123.1 nmol/L — ABNORMAL HIGH (ref <75.0)

## 2021-12-19 LAB — HEPARIN ANTI-XA: Heparin LMW: 1.15 IU/mL

## 2021-12-19 MED ORDER — EZETIMIBE 10 MG PO TABS
10.0000 mg | ORAL_TABLET | Freq: Every day | ORAL | Status: DC
  Administered 2021-12-19 – 2021-12-22 (×4): 10 mg via ORAL
  Filled 2021-12-19 (×4): qty 1

## 2021-12-19 MED ORDER — WARFARIN SODIUM 7.5 MG PO TABS
7.5000 mg | ORAL_TABLET | Freq: Once | ORAL | Status: AC
  Administered 2021-12-19: 7.5 mg via ORAL
  Filled 2021-12-19: qty 1

## 2021-12-19 MED ORDER — ENOXAPARIN SODIUM 100 MG/ML IJ SOSY
100.0000 mg | PREFILLED_SYRINGE | Freq: Two times a day (BID) | INTRAMUSCULAR | Status: DC
  Administered 2021-12-19 – 2021-12-22 (×6): 100 mg via SUBCUTANEOUS
  Filled 2021-12-19 (×8): qty 1

## 2021-12-19 MED ORDER — CARVEDILOL 25 MG PO TABS
25.0000 mg | ORAL_TABLET | Freq: Two times a day (BID) | ORAL | Status: DC
  Administered 2021-12-19 – 2021-12-21 (×4): 25 mg via ORAL
  Filled 2021-12-19 (×4): qty 1

## 2021-12-19 NOTE — Progress Notes (Addendum)
Rounding Note    Patient Name: [name] Date of Encounter: 12/19/2021  [place] Cardiologist: [name], MD   Subjective   IN chair  Comfortable  no CP  no SOB  Inpatient Medications    Scheduled Meds:  amLODipine  10 mg Oral Daily   aspirin EC  81 mg Oral Daily   carvedilol  18.75 mg Oral BID WC   cloNIDine  0.2 mg Oral BID   cyanocobalamin  1,000 mcg Intramuscular Q1500   doxazosin  4 mg Oral Daily   doxycycline  100 mg Oral Q12H   enoxaparin (LOVENOX) injection  115 mg Subcutaneous K99I   folic acid  1 mg Oral Daily   hydrochlorothiazide  12.5 mg Oral Daily   influenza vaccine adjuvanted  0.5 mL Intramuscular Tomorrow-1000   losartan  100 mg Oral Daily   lubiprostone  24 mcg Oral BID WC   polyethylene glycol  17 g Oral BID   sodium chloride flush  3 mL Intravenous Q12H   sodium chloride flush  3 mL Intravenous Q12H   warfarin  7.5 mg Oral ONCE-1600   Warfarin - Pharmacist Dosing Inpatient   Does not apply q1600   Continuous Infusions:  sodium chloride     PRN Meds: sodium chloride, acetaminophen, liver oil-zinc oxide, ondansetron (ZOFRAN) IV, mouth rinse, senna-docusate, sodium chloride flush   Vital Signs    Vitals:   12/18/21 2333 12/19/21 0000 12/19/21 0300 12/19/21 0400  BP: (!) 145/78     Pulse: 82   82  Resp: 16 18 (!) 21 20  Temp: 98.5 F (36.9 C)   98.2 F (36.8 C)  TempSrc: Oral   Oral  SpO2: 93%   94%  Weight:      Height:        Intake/Output Summary (Last 24 hours) at 12/19/2021 0731 Last data filed at 12/19/2021 0355 Gross per 24 hour  Intake 550 ml  Output 1250 ml  Net -700 ml   ? Complete   Neg 4 L     12/17/2021    3:20 AM 12/15/2021   11:02 PM 10/27/2021    9:39 AM  Last 3 Weights  Weight (lbs) 255 lb 8 oz 260 lb 6.4 oz 263 lb  Weight (kg) 115.894 kg 118.117 kg 119.296 kg      Telemetry    Afib 60s to 100s  - Personally Reviewed  ECG     - Personally Reviewed  Physical Exam   GEN: No acute  distress. Laying flat   Neck: Neck is full  Cardiac: Irreg irreg   II/VI systolic murmur base Respiratory: Clear to auscultation bilaterally. GI: Soft, nontender, non-distended  MS: No edema; No deformity. Neuro:  Nonfocal  Psych: Normal affect   Labs    High Sensitivity Troponin:   Recent Labs  Lab 12/14/21 2140 12/14/21 2343 12/15/21 0420 12/15/21 1330 12/16/21 0104  TROPONINIHS 21* 51* 196* 334* 204*     Chemistry Recent Labs  Lab 12/14/21 2140 12/15/21 0420 12/16/21 0104 12/17/21 1635 12/17/21 1643 12/17/21 1646  NA 137 134* 137 138 139 138  K 4.0 3.7 3.6 3.7 3.7 3.8  CL 103 102 104  --   --   --   CO2 '25 22 24  '$ --   --   --   GLUCOSE 147* 126* 109*  --   --   --   BUN '20 17 18  '$ --   --   --  CREATININE 0.86 0.81 0.96  --   --   --   CALCIUM 10.6* 10.4* 10.6*  --   --   --   PROT 6.5  --   --   --   --   --   ALBUMIN 3.4*  --   --   --   --   --   AST 22  --   --   --   --   --   ALT 20  --   --   --   --   --   ALKPHOS 82  --   --   --   --   --   BILITOT 0.9  --   --   --   --   --   GFRNONAA >60 >60 >60  --   --   --   ANIONGAP '9 10 9  '$ --   --   --     Lipids No results for input(s): "CHOL", "TRIG", "HDL", "LABVLDL", "LDLCALC", "CHOLHDL" in the last 168 hours.  Hematology Recent Labs  Lab 12/16/21 0104 12/17/21 0218 12/17/21 1635 12/17/21 1643 12/17/21 1646 12/18/21 0102  WBC 11.3* 8.5  --   --   --  6.3  RBC 3.76* 4.01*  --   --   --  4.01*  HGB 11.9* 12.3*   < > 11.9* 12.2* 12.4*  HCT 33.6* 36.7*   < > 35.0* 36.0* 36.5*  MCV 89.4 91.5  --   --   --  91.0  MCH 31.6 30.7  --   --   --  30.9  MCHC 35.4 33.5  --   --   --  34.0  RDW 14.4 14.3  --   --   --  13.8  PLT 163 187  --   --   --  204   < > = values in this interval not displayed.   Thyroid  Recent Labs  Lab 12/15/21 0420  TSH 0.579    BNPNo results for input(s): "BNP", "PROBNP" in the last 168 hours.  DDimer No results for input(s): "DDIMER" in the last 168 hours.    Radiology    CARDIAC CATHETERIZATION  Result Date: 12/18/2021   Prox LAD to Dist LAD lesion is 55% stenosed.   Mid RCA to Dist RCA lesion is 100% stenosed with 100% stenosed side branch in Acute Mrg.   1st Mrg lesion is 99% stenosed with 70% stenosed side branch in Lat 1st Mrg.   Dist Cx lesion is 70% stenosed with 100% stenosed side branch in 2nd Mrg.   LV end diastolic pressure is moderately elevated.   Hemodynamic findings consistent with mild pulmonary hypertension.   There is mild aortic valve stenosis. POST-OPERATIVE DIAGNOSIS: Relatively Normal Right Heart Cath Numbers: RAP mean 9 mmHg, RVP-EDP 37/4-9 mmHg PAP-mean 35/18-26 mmHg; PCWP 19/26 mmHg - 22 mmHg LVEDP-EDP 165/8-18 mmHg; AOP-MAP 150/81-107 mmHg Ao sat 96%, PA sat 65%. ` Cardiac Output 5.94, Cardiac Index 2.56 (mildly reduced Severe multivessel CAD: Small nondominant RCA (<1.5 mm) -100% CTO of the mid vessel Diffusely diseased 40 to 60% LAD from proximal to apical with no obvious focal lesion Large-caliber mostly dominant LCx with 2 major OM branches, 2 PL's and a PDA OM1 has segmental irregular hazy ulcerated appearing likely calcified lesion just proximal to the bifurcation into 2 subbranches (not favorable for PCI, but could be attempted if necessary) OM 2 is CTO flush occlusion (cannot tell if it actually comes  off of OM1 or comes off of the LCx as it fills via retrograde filling but not all the way to the parent vessel The AV groove circumflex after occluded OM2 has an eccentric appearing 70% stenosis then normalizes prior to the final 3 branches (LPL 1 LPL 2 and PDA PLAN OF CARE:  Return to nursing unit for ongoing care.  Okay to restart heparin or Lovenox in the morning 12/18/2021; can dose warfarin tonight Aggressive guideline directed medical therapy for A-fib, CAD Monitor for signs of concerning symptoms for angina or worsening CHF, would otherwise treat the OM and LCx lesions medically for now. Cath films were reviewed with Dr.  Martinique to make decision of attempted PCI versus medical therapy.  His recommendation independent mine was also to recommend medical management based on the the difficulty of intervention of the OM branch which is probably the culprit based on the downstream bifurcation.  If he were to have significant symptoms, this could potentially be attempted but will be difficult.  The LCx does have a lesion which may or may not be flow-limiting.  It could also be attempted, if symptoms warranted.  Glenetta Hew, MD    Cardiac Studies   R/L heart cath   12/17/21  Relatively Normal Right Heart Cath Numbers: RAP mean 9 mmHg, RVP-EDP 37/4-9 mmHg PAP-mean 35/18-26 mmHg; PCWP 19/26 mmHg - 22 mmHg LVEDP-EDP 165/8-18 mmHg; AOP-MAP 150/81-107 mmHg Ao sat 96%, PA sat 65%. ` Cardiac Output 5.94, Cardiac Index 2.56 (mildly reduced Severe multivessel CAD: Small nondominant RCA (<1.5 mm) -100% CTO of the mid vessel Diffusely diseased 40 to 60% LAD from proximal to apical with no obvious focal lesion Large-caliber mostly dominant LCx with 2 major OM branches, 2 PL's and a PDA OM1 has segmental irregular hazy ulcerated appearing likely calcified lesion just proximal to the bifurcation into 2 subbranches (not favorable for PCI, but could be attempted if necessary) OM 2 is CTO flush occlusion (cannot tell if it actually comes off of OM1 or comes off of the LCx as it fills via retrograde filling but not all the way to the parent vessel The AV groove circumflex after occluded OM2 has an eccentric appearing 70% stenosis then normalizes prior to the final 3 branches (LPL 1 LPL 2 and PDA  Plan: Aggressive guideline directed medical therapy for A-fib, CAD Monitor for signs of concerning symptoms for angina or worsening CHF, would otherwise treat the OM and LCx lesions medically for now. Cath films were reviewed with Dr. Martinique to make decision of attempted PCI versus medical therapy.  His recommendation independent mine was  also to recommend medical management based on the the difficulty of intervention of the OM branch which is probably the culprit based on the downstream bifurcation.  If he were to have significant symptoms, this could potentially be attempted but will be difficult.  The LCx does have a lesion which may or may not be flow-limiting.  It could also be attempted, if symptoms warranted    Echo  12/15/21 Left ventricular ejection fraction, by estimation, is 55 to 60%. The left ventricle has normal function. The left ventricle has no regional wall motion abnormalities. Left ventricular diastolic parameters are indeterminate. 1. Right ventricular systolic function is normal. The right ventricular size is normal. Tricuspid regurgitation signal is inadequate for assessing PA pressure. 2. 3. Left atrial size was moderately dilated. The mitral valve was not well visualized. Trivial mitral valve regurgitation (MR was reported as worse on last echo but mitral  valve poorly visualized on this echo). No evidence of mitral stenosis. 4. The aortic valve is tricuspid. There is moderate calcification of the aortic valve. Aortic valve regurgitation is mild. Moderate aortic valve stenosis. Aortic valve mean gradient measures 25.0 mmHg. Calculated AVA 1.5 cm^2. 5. Aortic dilatation noted. There is mild dilatation of the aortic root and of the ascending aorta, measuring 40 mm. 6. The inferior vena cava is dilated in size with >50% respiratory variability, suggesting right atrial pressure of 8 mmHg. 7. 8. The patient was in atrial fibrillation.  Patient Profile     71 y.o. male hx of DJD, obestiy, permanent atrial fibrillation, MR, AS who was admitted for weakness and indigestion     Mild elevation of troponin    Assessment & Plan    1   CAD  Pt with indigestion, weakness.   Mild elevation troponin CAD on CT in past  R heart cath   Mld elevation R heart pressures L heart cath with mod LAD dz, RCA small    LCx with occluded OM1,   OM2 with ulcerated lesion   This felt to be difficult to intervene on.  Distal LCx with 70%   Recomm is for trial of medical Rx      Keep on ASA         2  Aortic stenosis   Echo  this admit shows Moderate AS  Mean grad 25  Follow   3  Mitral regurg   Appears mild on echo   3  Atrial fib   Permanent   Rate control  has been on coumadin  Need to resume coumadin    He had not been on Eliquis or Xarelto in past due to cost    INR 1.2      Coumadin and lovenox   4  HL  Cant tolerate statins  Need to eval as outpt   Pt does not want to do injections on his own   Would be Incliseran 2x per year (by someone else)      5.HTN  BP is again high   Gave lasix x1 yesterday      I will increase carvedilol to 25 bid  Keep on losartan (ideally entresto, but again cost concerns) Patient needs to make sure cuf at home accurate  6   Constipation   This is a big complaint  Will defer to primary team  Write for meds for thos   8  Mobility   This is another very big problem for patient   PT to work with   From cardiac standpoint can go as INR is close  2   He does not need X bridging necessarily BUT he did get Vit K   Will have make app with lipid clinic   For questions or updates, please contact [place] Please consult [email or web address] for contact info under        Signed, [name], MD  12/19/2021, 7:31 AM

## 2021-12-19 NOTE — Progress Notes (Signed)
PROGRESS NOTE    [name]  ION:[id number] DOB: 72-Aug-1952 DOA: 12/14/2021 PCP: [name], MD   Brief Narrative: [name] is a 71 y.o. male wit a history of hypertension, aortic stenosis and atrial fibrillation on Coumadin who presented secondary weakness and dyspnea on exertion. Associated elevated troponin. Cardiology consulted. L/RHC performed on 10/13 which was significant for severe disease and recommendation for medical management.   Assessment and Plan:  Generalized weakness Dyspnea on exertion Nonspecific. Possibly related to bradycardia vs ACS vs infection vs aortic stenosis. Troponin elevated to 196 this morning. No symptoms at rest. Heart rate as low as 40s while on Coreg. EKG without strong evidence for ACS. Cardiology consulted and recommended L/RHC, which was performed on 10/13 and significant for severe multivessel CAD. Cardiology recommending medical management rather than PCI secondary to difficulty of intervention of the OM branch.  Severe CAD As mentioned above. -Cardiology recommendations: aspirin   Elevated troponin Hs Troponin trend of 21 > 51 > 196 > 334 > 204. No chest pain since admission but continued to have exertional dyspnea. As mentioned above.   Abdominal wall dermatitis Distribution seems more consistent with intertrigo than with cellulitis. Clinically, patient does have a mildly elevated WBC but no fever, tenderness or warmth of the area. Dermatitis is confined to skin folds of lower panus and inguinal areas. Patient started on antibiotics; will continue for no more than 5 days -Continue doxycycline -Keep area clean and dry -Barrier cream   Vitamin B12 deficiency Folate deficiency Vitamin B12 level of 74 and folate of 5. -Vitamin K44 and folic acid supplementation   Aortic stenosis Transthoracic Echocardiogram significant for moderate stenosis with mild regurgitation.   Permanent atrial fibrillation Coumadin held for heart  catheterization and has been resumed. Coreg increased to 25 mg BID by cardiology. -Continue Coreg -Per cardiology, Coumadin with Lovenox   Primary hypertension -Continue Coreg, amlodipine, losartan, hydrochlorothiazide  Constipation Chronic issue. Patient had concerns about constipation but declines enema and suppository. -Continue Amitiza -Continue MiraLAX  OSA -CPAP qHS   DVT prophylaxis: Coumadin/Lovenox Code Status:   Code Status: Full Code Family Communication: Nephew at bedside Disposition Plan: Discharge to SNF when bed is available. Medically stable for discharge once cardiology signs off.   Consultants:  Cardiology  Procedures:  Transthoracic Echocardiogram  Antimicrobials: Doxycycline Bactrim    Subjective: Some issues with constipation.  Objective: BP (!) 165/94 (BP Location: Left Arm)   Pulse 85   Temp 97.9 F (36.6 C) (Oral)   Resp 18   Ht '5\' 10"'$  (1.778 m)   Wt 115.9 kg   SpO2 97%   BMI 36.66 kg/m   Examination:  General exam: Appears calm and comfortable Respiratory system: Clear to auscultation. Respiratory effort normal. Cardiovascular system: S1 & S2 heard, irregular rhythm. 2/6 systolic murmur. Gastrointestinal system: Abdomen is nondistended, soft and nontender. Normal bowel sounds heard. Central nervous system: Alert and oriented. No focal neurological deficits. Musculoskeletal: No calf tenderness Skin: No cyanosis. No rashes Psychiatry: Judgement and insight appear normal. Mood & affect appropriate.    Data Reviewed: I have personally reviewed following labs and imaging studies  CBC Lab Results  Component Value Date   WBC 6.3 12/18/2021   RBC 4.01 (L) 12/18/2021   HGB 12.4 (L) 12/18/2021   HCT 36.5 (L) 12/18/2021   MCV 91.0 12/18/2021   MCH 30.9 12/18/2021   PLT 204 12/18/2021   MCHC 34.0 12/18/2021   RDW 13.8 12/18/2021   LYMPHSABS 0.5 (L)  12/14/2021   MONOABS 0.7 12/14/2021   EOSABS 0.0 12/14/2021   BASOSABS 0.0  14/43/1540     Last metabolic panel Lab Results  Component Value Date   NA 138 12/17/2021   K 3.8 12/17/2021   CL 104 12/16/2021   CO2 24 12/16/2021   BUN 18 12/16/2021   CREATININE 0.96 12/16/2021   GLUCOSE 109 (H) 12/16/2021   GFRNONAA >60 12/16/2021   GFRAA 72 72/12/2019   CALCIUM 10.6 (H) 12/16/2021   PROT 6.5 12/14/2021   ALBUMIN 3.4 (L) 12/14/2021   BILITOT 72.9 12/14/2021   ALKPHOS 82 12/14/2021   AST 22 12/14/2021   ALT 20 12/14/2021   ANIONGAP 9 12/16/2021    GFR: Estimated Creatinine Clearance: 90 mL/min (by C-G formula based on SCr of 0.96 mg/dL).  Recent Results (from the past 240 hour(s))  Urine Culture     Status: None   Collection Time: 12/14/21 10:22 PM   Specimen: Urine, Clean Catch  Result Value Ref Range Status   Specimen Description URINE, CLEAN CATCH  Final   Special Requests NONE  Final   Culture   Final    NO GROWTH Performed at [facility], [address], [place], [place] [address]    Report Status 12/16/2021 FINAL  Final  SARS Coronavirus 2 by RT PCR (hospital order, performed in [facility] hospital lab) *cepheid single result test* Anterior Nasal Swab     Status: None   Collection Time: 12/14/21 11:43 PM   Specimen: Anterior Nasal Swab  Result Value Ref Range Status   SARS Coronavirus 2 by RT PCR NEGATIVE NEGATIVE Final    Comment: (NOTE) SARS-CoV-2 target nucleic acids are NOT DETECTED.  The SARS-CoV-2 RNA is generally detectable in upper and lower respiratory specimens during the acute phase of infection. The lowest concentration of SARS-CoV-2 viral copies this assay can detect is 250 copies / mL. A negative result does not preclude SARS-CoV-2 infection and should not be used as the sole basis for treatment or other patient management decisions.  A negative result may occur with improper specimen collection / handling, submission of specimen other than nasopharyngeal swab, presence of viral mutation(s) within the areas  targeted by this assay, and inadequate number of viral copies (<250 copies / mL). A negative result must be combined with clinical observations, patient history, and epidemiological information.  Fact Sheet for Patients:   https://www.patel.info/  Fact Sheet for Healthcare Providers: https://Paulick.com/  This test is not yet approved or  cleared by the [place] FDA and has been authorized for detection and/or diagnosis of SARS-CoV-2 by FDA under an Emergency Use Authorization (EUA).  This EUA will remain in effect (meaning this test can be used) for the duration of the COVID-19 declaration under Section 564(b)(1) of the Act, 21 U.S.C. section 360bbb-3(b)(1), unless the authorization is terminated or revoked sooner.  Performed at [facility], [place] [employer] [address], [place], [place] [address]   Respiratory (~20 pathogens) panel by PCR     Status: None   Collection Time: 12/15/21  1:30 PM   Specimen: Nasopharyngeal Swab; Respiratory  Result Value Ref Range Status   Adenovirus NOT DETECTED NOT DETECTED Final   Coronavirus 229E NOT DETECTED NOT DETECTED Final    Comment: (NOTE) The Coronavirus on the Respiratory Panel, DOES NOT test for the novel  Coronavirus (2019 nCoV)    Coronavirus HKU1 NOT DETECTED NOT DETECTED Final   Coronavirus NL63 NOT DETECTED NOT DETECTED Final   Coronavirus OC43  NOT DETECTED NOT DETECTED Final   Metapneumovirus NOT DETECTED NOT DETECTED Final   Rhinovirus / Enterovirus NOT DETECTED NOT DETECTED Final   Influenza A NOT DETECTED NOT DETECTED Final   Influenza B NOT DETECTED NOT DETECTED Final   Parainfluenza Virus 1 NOT DETECTED NOT DETECTED Final   Parainfluenza Virus 2 NOT DETECTED NOT DETECTED Final   Parainfluenza Virus 3 NOT DETECTED NOT DETECTED Final   Parainfluenza Virus 4 NOT DETECTED NOT DETECTED Final   Respiratory Syncytial Virus NOT DETECTED NOT DETECTED Final   Bordetella pertussis NOT DETECTED  NOT DETECTED Final   Bordetella Parapertussis NOT DETECTED NOT DETECTED Final   Chlamydophila pneumoniae NOT DETECTED NOT DETECTED Final   Mycoplasma pneumoniae NOT DETECTED NOT DETECTED Final    Comment: Performed at [facility], [place] [address], [place], [place] [address]      Radiology Studies: CARDIAC CATHETERIZATION  Result Date: 12/18/2021   Prox LAD to Dist LAD lesion is 55% stenosed.   Mid RCA to Dist RCA lesion is 100% stenosed with 100% stenosed side branch in Acute Mrg.   1st Mrg lesion is 99% stenosed with 70% stenosed side branch in Lat 1st Mrg.   Dist Cx lesion is 70% stenosed with 100% stenosed side branch in 2nd Mrg.   LV end diastolic pressure is moderately elevated.   Hemodynamic findings consistent with mild pulmonary hypertension.   There is mild aortic valve stenosis. POST-OPERATIVE DIAGNOSIS: Relatively Normal Right Heart Cath Numbers: RAP mean 9 mmHg, RVP-EDP 37/4-9 mmHg PAP-mean 35/18-26 mmHg; PCWP 19/26 mmHg - 22 mmHg LVEDP-EDP 165/8-18 mmHg; AOP-MAP 150/81-107 mmHg Ao sat 96%, PA sat 65%. ` Cardiac Output 5.94, Cardiac Index 2.56 (mildly reduced Severe multivessel CAD: Small nondominant RCA (<1.5 mm) -100% CTO of the mid vessel Diffusely diseased 40 to 60% LAD from proximal to apical with no obvious focal lesion Large-caliber mostly dominant LCx with 2 major OM branches, 2 PL's and a PDA OM1 has segmental irregular hazy ulcerated appearing likely calcified lesion just proximal to the bifurcation into 2 subbranches (not favorable for PCI, but could be attempted if necessary) OM 2 is CTO flush occlusion (cannot tell if it actually comes off of OM1 or comes off of the LCx as it fills via retrograde filling but not all the way to the parent vessel The AV groove circumflex after occluded OM2 has an eccentric appearing 70% stenosis then normalizes prior to the final 3 branches (LPL 1 LPL 2 and PDA PLAN OF CARE:  Return to nursing unit for ongoing care.  Okay to restart heparin  or Lovenox in the morning 12/18/2021; can dose warfarin tonight Aggressive guideline directed medical therapy for A-fib, CAD Monitor for signs of concerning symptoms for angina or worsening CHF, would otherwise treat the OM and LCx lesions medically for now. Cath films were reviewed with Dr. Martinique to make decision of attempted PCI versus medical therapy.  His recommendation independent mine was also to recommend medical management based on the the difficulty of intervention of the OM branch which is probably the culprit based on the downstream bifurcation.  If he were to have significant symptoms, this could potentially be attempted but will be difficult.  The LCx does have a lesion which may or may not be flow-limiting.  It could also be attempted, if symptoms warranted.  Glenetta Hew, MD      LOS: 2 days    Cordelia Poche, MD [facility] 12/19/2021, 2:34 PM   If 7PM-7AM, please contact  night-coverage [email or web address]

## 2021-12-19 NOTE — Progress Notes (Signed)
ANTICOAGULATION CONSULT NOTE - Follow-up  Pharmacy Consult for Warfarin/Lovenox Indication:  afib, CAD  Allergies  Allergen Reactions   Statins Other (See Comments)    "Muscles were messed up"    Penicillins Other (See Comments)    Childhood reaction. Unknown per Pt, cannot remember ever trying beta lactams     Patient Measurements: Height: '5\' 10"'$  (177.8 cm) Weight: 115.9 kg (255 lb 8 oz) IBW/kg (Calculated) : 73  Vital Signs: Temp: 97.9 F (36.6 C) (10/15 0816) Temp Source: Oral (10/15 0816) BP: 165/94 (10/15 0816) Pulse Rate: 85 (10/15 0816)  Labs: Recent Labs    12/17/21 0218 12/17/21 1635 12/17/21 1643 12/17/21 1646 12/18/21 0102 12/19/21 0126  HGB 12.3*   < > 11.9* 12.2* 12.4*  --   HCT 36.7*   < > 35.0* 36.0* 36.5*  --   PLT 187  --   --   --  204  --   LABPROT 17.8*  --   --   --  15.2 15.2  INR 1.5*  --   --   --  1.2 1.2   < > = values in this interval not displayed.     Estimated Creatinine Clearance: 90 mL/min (by C-G formula based on SCr of 0.96 mg/dL).   Medical History: Past Medical History:  Diagnosis Date   Adenomatous colon polyp 07/2003   Heart valve problem    Dr [name]   Hypertension    Mitral valve disorders(424.0)    Nonrheumatic aortic valve stenosis 01/14/2021   Echo 9/22: EF 60-65, small pericardial effusion, mild AS (mean gradient 16 mmHg), trivial AI, mild MR (MR is eccentric - may be underestimated) // Echo 10/2021: EF 60-65, mod calcification of AV, trivial AI, mod-severe AS (DI 0.25, V-max 350 cm/s, mean 28.8), no RWMA, NL RVSF, severe LAE, mild RAE, small effusion w/o tamponade, ?mild-moderate MR (difficult due to eccentric jet)   Nonrheumatic mitral valve regurgitation 01/14/2021   Echo 10/2021: EF 60-65, moderate calcification of the aortic valve, trivial AI, moderate to severe aortic stenosis (DI 0.25, V-max 350 cm/s, mean gradient 28.8), no RWMA, normal RVSF, severe LAE, mild RAE, small pericardial effusion without  tamponade, suspect mild-moderate MR (difficult to qualify degree due to eccentric jet with coanda affect)   Obesity    OSA (obstructive sleep apnea)    cpap   Other and unspecified hyperlipidemia    Permanent atrial fibrillation ([place])    Zio Monitor 5/22: AFib - Avg HR 55; slowest 32; longest pause 3.9 sec; occ PVCs, 4 beat NSVT   Sleep apnea     Medications:  Medications Prior to Admission  Medication Sig Dispense Refill Last Dose   acetaminophen (TYLENOL) 325 MG tablet Take 650 mg by mouth daily as needed (pain).   3 months   amLODipine (NORVASC) 10 MG tablet Take 1 tablet (10 mg total) by mouth daily. 90 tablet 3 12/14/2021   carvedilol (COREG) 12.5 MG tablet TAKE 1 AND 1/2 TABLET BY MOUTH TWO TIMES A DAY (Patient taking differently: Take 18.75 mg by mouth 2 (two) times daily with a meal.) 270 tablet 3 12/14/2021 at 1815-1830   cloNIDine (CATAPRES) 0.2 MG tablet Take 1 tablet (0.2 mg total) by mouth 2 (two) times daily. 180 tablet 3 12/14/2021   doxazosin (CARDURA) 4 MG tablet Take 1 tablet (4 mg total) by mouth daily. 90 tablet 3 12/14/2021   ezetimibe (ZETIA) 10 MG tablet Take 1 tablet (10 mg total) by mouth daily. 90 tablet 3 12/14/2021  hydrochlorothiazide (MICROZIDE) 12.5 MG capsule Take 1 capsule (12.5 mg total) by mouth daily. (Patient taking differently: Take 12.5 mg by mouth every evening.) 90 capsule 3 12/14/2021   losartan (COZAAR) 100 MG tablet Take 1 tablet (100 mg total) by mouth daily. 90 tablet 3 12/14/2021   lubiprostone (AMITIZA) 24 MCG capsule Take 1 capsule (24 mcg total) by mouth 2 (two) times daily. 180 capsule 3 12/14/2021   meloxicam (MOBIC) 15 MG tablet Take 1 tablet (15 mg total) by mouth daily. 90 tablet 3 12/14/2021   OMEGA-3 FATTY ACIDS PO Take 1 capsule by mouth in the morning and at bedtime.   12/14/2021   warfarin (COUMADIN) 5 MG tablet TAKE 1 TO 1 AND 1/2 TABLETS BY MOUTH DAILY AS DIRECTED BY COUMADIN CLINIC (Patient taking differently: Take 7.5 mg by  mouth every evening.) 50 tablet 3 12/14/2021 at 1815   Iron, Ferrous Sulfate, 325 (65 Fe) MG TABS Take 325 mg by mouth daily. (Patient not taking: Reported on 12/15/2021) 90 tablet 3 Not Taking   ketoconazole (NIZORAL) 2 % cream Apply 1 application topically 2 (two) times daily as needed for irritation. (Patient not taking: Reported on 12/15/2021) 30 g 2 Completed Course    Assessment: 71 y.o. M presents with CP. S/p cath 10/13 which showed multivessel CAD. Plan for aggressive medical management instead of PCI. Pt on warfarin PTA for afib. Warfarin held for cath since 10/10 and reversed with vit K 2.'5mg'$  10/12 and '5mg'$  10/13. CBC stable. Noted pt received Lovenox '120mg'$  x 1 pre-cath 10/12pm  Home dose: 7.'5mg'$  daily  INR 1.2 today after vitamin k on 10/12. We will repeat coumadin 7.5 today. Will check copay for apixaban again on Monday.   Goal of Therapy:  INR 2-3, anti-Xa level 0.6-1 (4 hrs post Lovenox) Monitor platelets by anticoagulation protocol: Yes   Plan:  Anti-Xa level at 1530 Continue Lovenox '115mg'$  SQ Q12h and adjust based on results of #1 Warfarin 7.'5mg'$  po X1 today Daily INR  Keir Foland BS, PharmD, BCPS Clinical Pharmacist 12/19/2021 1:58 PM  Contact: [phone] after 3 PM  "Be curious, not judgmental..." -Jamal Maes

## 2021-12-19 NOTE — Progress Notes (Signed)
ANTICOAGULATION CONSULT NOTE - Follow-up  Pharmacy Consult for Warfarin/Lovenox Indication:  afib, CAD  Allergies  Allergen Reactions   Statins Other (See Comments)    "Muscles were messed up"    Penicillins Other (See Comments)    Childhood reaction. Unknown per Pt, cannot remember ever trying beta lactams     Patient Measurements: Height: '5\' 10"'$  (177.8 cm) Weight: 115.9 kg (255 lb 8 oz) IBW/kg (Calculated) : 73  Vital Signs: Temp: 97.9 F (36.6 C) (10/15 0816) Temp Source: Oral (10/15 0816) BP: 165/94 (10/15 0816) Pulse Rate: 85 (10/15 0816)  Labs: Recent Labs    12/17/21 0218 12/17/21 1635 12/17/21 1643 12/17/21 1646 12/18/21 0102 12/19/21 0126 12/19/21 1451  HGB 12.3*   < > 11.9* 12.2* 12.4*  --   --   HCT 36.7*   < > 35.0* 36.0* 36.5*  --   --   PLT 187  --   --   --  204  --   --   LABPROT 17.8*  --   --   --  15.2 15.2  --   INR 1.5*  --   --   --  1.2 1.2  --   HEPRLOWMOCWT  --   --   --   --   --   --  1.15   < > = values in this interval not displayed.     Estimated Creatinine Clearance: 90 mL/min (by C-G formula based on SCr of 0.96 mg/dL).   Medical History: Past Medical History:  Diagnosis Date   Adenomatous colon polyp 07/2003   Heart valve problem    Dr [name]   Hypertension    Mitral valve disorders(424.0)    Nonrheumatic aortic valve stenosis 01/14/2021   Echo 9/22: EF 60-65, small pericardial effusion, mild AS (mean gradient 16 mmHg), trivial AI, mild MR (MR is eccentric - may be underestimated) // Echo 10/2021: EF 60-65, mod calcification of AV, trivial AI, mod-severe AS (DI 0.25, V-max 350 cm/s, mean 28.8), no RWMA, NL RVSF, severe LAE, mild RAE, small effusion w/o tamponade, ?mild-moderate MR (difficult due to eccentric jet)   Nonrheumatic mitral valve regurgitation 01/14/2021   Echo 10/2021: EF 60-65, moderate calcification of the aortic valve, trivial AI, moderate to severe aortic stenosis (DI 0.25, V-max 350 cm/s, mean gradient  28.8), no RWMA, normal RVSF, severe LAE, mild RAE, small pericardial effusion without tamponade, suspect mild-moderate MR (difficult to qualify degree due to eccentric jet with coanda affect)   Obesity    OSA (obstructive sleep apnea)    cpap   Other and unspecified hyperlipidemia    Permanent atrial fibrillation ([place])    Zio Monitor 5/22: AFib - Avg HR 55; slowest 32; longest pause 3.9 sec; occ PVCs, 4 beat NSVT   Sleep apnea     Medications:  Medications Prior to Admission  Medication Sig Dispense Refill Last Dose   acetaminophen (TYLENOL) 325 MG tablet Take 650 mg by mouth daily as needed (pain).   3 months   amLODipine (NORVASC) 10 MG tablet Take 1 tablet (10 mg total) by mouth daily. 90 tablet 3 12/14/2021   carvedilol (COREG) 12.5 MG tablet TAKE 1 AND 1/2 TABLET BY MOUTH TWO TIMES A DAY (Patient taking differently: Take 18.75 mg by mouth 2 (two) times daily with a meal.) 270 tablet 3 12/14/2021 at 1815-1830   cloNIDine (CATAPRES) 0.2 MG tablet Take 1 tablet (0.2 mg total) by mouth 2 (two) times daily. 180 tablet 3 12/14/2021  doxazosin (CARDURA) 4 MG tablet Take 1 tablet (4 mg total) by mouth daily. 90 tablet 3 12/14/2021   ezetimibe (ZETIA) 10 MG tablet Take 1 tablet (10 mg total) by mouth daily. 90 tablet 3 12/14/2021   hydrochlorothiazide (MICROZIDE) 12.5 MG capsule Take 1 capsule (12.5 mg total) by mouth daily. (Patient taking differently: Take 12.5 mg by mouth every evening.) 90 capsule 3 12/14/2021   losartan (COZAAR) 100 MG tablet Take 1 tablet (100 mg total) by mouth daily. 90 tablet 3 12/14/2021   lubiprostone (AMITIZA) 24 MCG capsule Take 1 capsule (24 mcg total) by mouth 2 (two) times daily. 180 capsule 3 12/14/2021   meloxicam (MOBIC) 15 MG tablet Take 1 tablet (15 mg total) by mouth daily. 90 tablet 3 12/14/2021   OMEGA-3 FATTY ACIDS PO Take 1 capsule by mouth in the morning and at bedtime.   12/14/2021   warfarin (COUMADIN) 5 MG tablet TAKE 1 TO 1 AND 1/2 TABLETS BY MOUTH  DAILY AS DIRECTED BY COUMADIN CLINIC (Patient taking differently: Take 7.5 mg by mouth every evening.) 50 tablet 3 12/14/2021 at 1815   Iron, Ferrous Sulfate, 325 (65 Fe) MG TABS Take 325 mg by mouth daily. (Patient not taking: Reported on 12/15/2021) 90 tablet 3 Not Taking   ketoconazole (NIZORAL) 2 % cream Apply 1 application topically 2 (two) times daily as needed for irritation. (Patient not taking: Reported on 12/15/2021) 30 g 2 Completed Course    Assessment: 71 y.o. M presents with CP. S/p cath 10/13 which showed multivessel CAD. Plan for aggressive medical management instead of PCI. Pt on warfarin PTA for afib. Warfarin held for cath since 10/10 and reversed with vit K 2.'5mg'$  10/12 and '5mg'$  10/13. CBC stable. Noted pt received Lovenox '120mg'$  x 1 pre-cath 10/12pm  Home dose: 7.'5mg'$  daily  INR 1.2 today after vitamin k on 10/12. We will repeat coumadin 7.5 today. Will check copay for apixaban again on Monday.   Anti-xa level resulted 1.15, looks to be timed/drawn appropriately. Level just slightly above goal, will make small rate adjustment.   Goal of Therapy:  INR 2-3, anti-Xa level 0.6-1 (4 hrs post Lovenox) Monitor platelets by anticoagulation protocol: Yes   Plan:  Decrease lovenox to 100 mg q12 with next dose Continue with warfarin as previously ordered  Erin Hearing PharmD., BCPS Clinical Pharmacist 12/19/2021 4:09 PM

## 2021-12-19 NOTE — NC FL2 (Signed)
[place] LEVEL OF CARE SCREENING TOOL     IDENTIFICATION  Patient Name: [name] Birthdate: 12/01/1950 Sex: male Admission Date (Current Location): 12/14/2021  [facility] and [place] Number:  Herbalist and Address:  [facility] [place], [place] [address], [place], [place] [address]      Provider Number: [id number]  Attending Physician Name and Address:  Mariel Aloe, MD  Relative Name and Phone Number:       Current Level of Care: Hospital Recommended Level of Care: [place] Prior Approval Number:    Date Approved/Denied:   PASRR Number: [id number]  Discharge Plan: SNF    Current Diagnoses: Patient Active Problem List   Diagnosis Date Noted   Elevated troponin 12/15/2021   Cellulitis of abdominal wall 12/15/2021   Generalized weakness 12/15/2021   Stasis ulcer ([place]) 07/15/2021   Nonrheumatic mitral valve regurgitation 01/14/2021   Nonrheumatic aortic valve stenosis 01/14/2021   Bradycardia 01/14/2021   Hypercalcemia 09/26/2020   Permanent atrial fibrillation ([place])    Prediabetes 07/21/2016   Myalgia and myositis 10/17/2013   Right hip pain 10/17/2013   Unspecified vitamin D deficiency 10/11/2013   Encounter for therapeutic drug monitoring 06/03/2013   OTHER CONSTIPATION 01/20/2010   PERSONAL HX COLONIC POLYPS 01/20/2010   OBSTRUCTIVE SLEEP APNEA 11/17/2008   ATRIAL FIBRILLATION 08/18/2008   Dyslipidemia 06/18/2007   OBESITY 06/18/2007   INTERNAL HEMORRHOIDS 06/18/2007   EXTERNAL HEMORRHOIDS 06/18/2007   Resistant hypertension 09/04/2006   MITRAL REGURGITATION, MODERATE 09/04/2006   RENAL CALCULUS, HX OF 09/04/2006   ADENOMATOUS COLONIC POLYP 07/29/2003    Orientation RESPIRATION BLADDER Height & Weight     Self, Time, Situation, Place  Normal Incontinent, External catheter Weight: 255 lb 8 oz (115.9 kg) Height:  '5\' 10"'$  (177.8 cm)  BEHAVIORAL SYMPTOMS/MOOD NEUROLOGICAL BOWEL NUTRITION STATUS       Incontinent Diet (See DC summary)  AMBULATORY STATUS COMMUNICATION OF NEEDS Skin   Extensive Assist Verbally Normal                       Personal Care Assistance Level of Assistance  Bathing, Feeding, Dressing Bathing Assistance: Limited assistance Feeding assistance: Limited assistance Dressing Assistance: Limited assistance     Functional Limitations Info  Sight, Hearing, Speech Sight Info: Adequate Hearing Info: Adequate Speech Info: Adequate    SPECIAL CARE FACTORS FREQUENCY  PT (By licensed PT), OT (By licensed OT)     PT Frequency: 5x a week OT Frequency: 5x a week            Contractures Contractures Info: Not present    Additional Factors Info  Code Status, Allergies Code Status Info: DNR Allergies Info: Statins   Penicillins           Current Medications (12/19/2021):  This is the current hospital active medication list Current Facility-Administered Medications  Medication Dose Route Frequency Provider Last Rate Last Admin   0.9 %  sodium chloride infusion  250 mL Intravenous PRN Leonie Man, MD       acetaminophen (TYLENOL) tablet 650 mg  650 mg Oral Q4H PRN Leonie Man, MD       amLODipine (NORVASC) tablet 10 mg  10 mg Oral Daily Leonie Man, MD   10 mg at 12/19/21 1022   aspirin EC tablet 81 mg  81 mg Oral Daily Leonie Man, MD   81 mg at 12/19/21 1022   carvedilol (COREG)  tablet 25 mg  25 mg Oral BID WC Fay Records, MD       cloNIDine (CATAPRES) tablet 0.2 mg  0.2 mg Oral BID Leonie Man, MD   0.2 mg at 12/19/21 1022   cyanocobalamin (VITAMIN B12) injection 1,000 mcg  1,000 mcg Intramuscular Q1500 Leonie Man, MD   1,000 mcg at 12/18/21 1559   doxazosin (CARDURA) tablet 4 mg  4 mg Oral Daily Leonie Man, MD   4 mg at 12/19/21 1022   doxycycline (VIBRA-TABS) tablet 100 mg  100 mg Oral Q12H Leonie Man, MD   100 mg at 12/19/21 1022   enoxaparin (LOVENOX) injection 115 mg  115 mg Subcutaneous Q12H  Franky Macho, RPH   115 mg at 12/19/21 1023   ezetimibe (ZETIA) tablet 10 mg  10 mg Oral Daily Mariel Aloe, MD   10 mg at 96/22/29 7989   folic acid (FOLVITE) tablet 1 mg  1 mg Oral Daily Leonie Man, MD   1 mg at 12/19/21 1021   hydrochlorothiazide (HYDRODIURIL) tablet 12.5 mg  12.5 mg Oral Daily Leonie Man, MD   12.5 mg at 12/19/21 1022   influenza vaccine adjuvanted (FLUAD) injection 0.5 mL  0.5 mL Intramuscular Tomorrow-1000 Mariel Aloe, MD       liver oil-zinc oxide (DESITIN) 40 % ointment   Topical PRN Leonie Man, MD       losartan (COZAAR) tablet 100 mg  100 mg Oral Daily Leonie Man, MD   100 mg at 12/19/21 1022   lubiprostone (AMITIZA) capsule 24 mcg  24 mcg Oral BID WC Leonie Man, MD   24 mcg at 12/19/21 1022   ondansetron (ZOFRAN) injection 4 mg  4 mg Intravenous Q6H PRN Leonie Man, MD       Oral care mouth rinse  15 mL Mouth Rinse PRN Mariel Aloe, MD       polyethylene glycol (MIRALAX / GLYCOLAX) packet 17 g  17 g Oral BID Mariel Aloe, MD   17 g at 12/19/21 1030   senna-docusate (Senokot-S) tablet 1 tablet  1 tablet Oral QHS PRN Leonie Man, MD   1 tablet at 12/18/21 1559   sodium chloride flush (NS) 0.9 % injection 3 mL  3 mL Intravenous Q12H Leonie Man, MD   3 mL at 12/18/21 2153   sodium chloride flush (NS) 0.9 % injection 3 mL  3 mL Intravenous Q12H Leonie Man, MD   3 mL at 12/18/21 2152   sodium chloride flush (NS) 0.9 % injection 3 mL  3 mL Intravenous PRN Leonie Man, MD       warfarin (COUMADIN) tablet 7.5 mg  7.5 mg Oral ONCE-1600 Pham, Juncal Q, RPH-CPP       Warfarin - Pharmacist Dosing Inpatient   Does not apply [place], [facility]         Discharge Medications: Please see discharge summary for a list of discharge medications.  Relevant Imaging Results:  Relevant Lab Results:   Additional Information SSN [id number]  Emeterio Reeve,

## 2021-12-19 NOTE — Telephone Encounter (Signed)
Put in consult for lipid clinic for possible incliseran PT also on coumadin   says he cant afford eliquis /xarelto  REevaluate

## 2021-12-19 NOTE — TOC Initial Note (Signed)
Transition of Care [facility]) - Initial/Assessment Note    Patient Details  Name: [name] MRN: [id number] Date of Birth: [birth year]  [facility] CM/SW Contact:    Emeterio Reeve, LCSW Phone Number: 12/19/2021, 1:22 PM  Clinical Narrative:                  CSW received SNF consult. CSW met with pt at bedside. CSW introduced self and explained role at the hospital. Pts nephew Roderic Palau was present. Pt reports that PTA he lives at home alone and is able to complete ADL's on his own. Pt uses a walker. Nephew checks on him a couple times a week.  CSW reviewed PT/OT recommendations for SNF. Pt reports that he is fine with SNF. Pt gave CSW permission to fax out to facilities in the area. Pt has no preference of facility at this time. CSW gave pt medicare.gov rating list to review. CSW explained insurance auth process.  CSW will continue to follow.   Expected Discharge Plan: Skilled Nursing Facility Barriers to Discharge: Continued Medical Work up, Ship broker, SNF Pending bed offer   Patient Goals and CMS Choice Patient states their goals for this hospitalization and ongoing recovery are:: SNF CMS Medicare.gov Compare Post Acute Care list provided to:: Patient Choice offered to / list presented to : Patient  Expected Discharge Plan and Services Expected Discharge Plan: [place]       Living arrangements for the past 2 months: Apartment                                      Prior Living Arrangements/Services Living arrangements for the past 2 months: Apartment Lives with:: Self Patient language and need for interpreter reviewed:: Yes        Need for Family Participation in Patient Care: Yes (Comment) Care giver support system in place?: Yes (comment) Current home services: DME Criminal Activity/Legal Involvement Pertinent to Current Situation/Hospitalization: No - Comment as needed  Activities of Daily Living Home Assistive  Devices/Equipment: Walker (specify type) ADL Screening (condition at time of admission) Patient's cognitive ability adequate to safely complete daily activities?: Yes Is the patient deaf or have difficulty hearing?: No Does the patient have difficulty seeing, even when wearing glasses/contacts?: No Does the patient have difficulty concentrating, remembering, or making decisions?: No Patient able to express need for assistance with ADLs?: Yes Does the patient have difficulty dressing or bathing?: Yes Independently performs ADLs?: Yes (appropriate for developmental age) Does the patient have difficulty walking or climbing stairs?: Yes Weakness of Legs: Both Weakness of Arms/Hands: None  Permission Sought/Granted   Permission granted to share information with : Yes, Verbal Permission Granted     Permission granted to share info w AGENCY: SNF        Emotional Assessment Appearance:: Appears stated age Attitude/Demeanor/Rapport: Engaged Affect (typically observed): Appropriate Orientation: : Oriented to Place, Oriented to  Time, Oriented to Situation Alcohol / Substance Use: Not Applicable Psych Involvement: No (comment)  Admission diagnosis:  Cellulitis of abdominal wall [L03.311] Elevated troponin [R79.89] Generalized weakness [R53.1] Patient Active Problem List   Diagnosis Date Noted   Elevated troponin 12/15/2021   Cellulitis of abdominal wall 12/15/2021   Generalized weakness 12/15/2021   Stasis ulcer ([place]) 07/15/2021   Nonrheumatic mitral valve regurgitation 01/14/2021   Nonrheumatic aortic valve stenosis 01/14/2021   Bradycardia 01/14/2021   Hypercalcemia 09/26/2020  Permanent atrial fibrillation ([facility])    Prediabetes 07/21/2016   Myalgia and myositis 10/17/2013   Right hip pain 10/17/2013   Unspecified vitamin D deficiency 10/11/2013   Encounter for therapeutic drug monitoring 06/03/2013   OTHER CONSTIPATION 01/20/2010   PERSONAL HX COLONIC POLYPS 01/20/2010    OBSTRUCTIVE SLEEP APNEA 11/17/2008   ATRIAL FIBRILLATION 08/18/2008   Dyslipidemia 06/18/2007   OBESITY 06/18/2007   INTERNAL HEMORRHOIDS 06/18/2007   EXTERNAL HEMORRHOIDS 06/18/2007   Resistant hypertension 09/04/2006   MITRAL REGURGITATION, MODERATE 09/04/2006   RENAL CALCULUS, HX OF 09/04/2006   ADENOMATOUS COLONIC POLYP 07/29/2003   PCP:  [name], MD Pharmacy:   [facility] [id number] - [address], [place] [address] Phone: [phone] Fax: [phone]     Social Determinants of Health ([place]) Interventions    Readmission Risk Interventions     No data to display         Emeterio Reeve, [place] Social Worker

## 2021-12-20 ENCOUNTER — Encounter (HOSPITAL_COMMUNITY): Payer: Self-pay | Admitting: Cardiology

## 2021-12-20 ENCOUNTER — Telehealth (HOSPITAL_COMMUNITY): Payer: Self-pay | Admitting: Pharmacy Technician

## 2021-12-20 ENCOUNTER — Other Ambulatory Visit (HOSPITAL_COMMUNITY): Payer: Self-pay

## 2021-12-20 DIAGNOSIS — R531 Weakness: Secondary | ICD-10-CM | POA: Diagnosis not present

## 2021-12-20 DIAGNOSIS — I4821 Permanent atrial fibrillation: Secondary | ICD-10-CM | POA: Diagnosis not present

## 2021-12-20 DIAGNOSIS — I214 Non-ST elevation (NSTEMI) myocardial infarction: Secondary | ICD-10-CM | POA: Diagnosis not present

## 2021-12-20 DIAGNOSIS — I1A0 Resistant hypertension: Secondary | ICD-10-CM | POA: Diagnosis not present

## 2021-12-20 DIAGNOSIS — I350 Nonrheumatic aortic (valve) stenosis: Secondary | ICD-10-CM | POA: Diagnosis not present

## 2021-12-20 DIAGNOSIS — L03311 Cellulitis of abdominal wall: Secondary | ICD-10-CM | POA: Diagnosis not present

## 2021-12-20 DIAGNOSIS — R7989 Other specified abnormal findings of blood chemistry: Secondary | ICD-10-CM | POA: Diagnosis not present

## 2021-12-20 LAB — CBC
HCT: 37 % — ABNORMAL LOW (ref 39.0–52.0)
Hemoglobin: 12.9 g/dL — ABNORMAL LOW (ref 13.0–17.0)
MCH: 30.8 pg (ref 26.0–34.0)
MCHC: 34.9 g/dL (ref 30.0–36.0)
MCV: 88.3 fL (ref 80.0–100.0)
Platelets: 241 10*3/uL (ref 150–400)
RBC: 4.19 MIL/uL — ABNORMAL LOW (ref 4.22–5.81)
RDW: 13.5 % (ref 11.5–15.5)
WBC: 7.1 10*3/uL (ref 4.0–10.5)
nRBC: 0 % (ref 0.0–0.2)

## 2021-12-20 LAB — PROTIME-INR
INR: 1.3 — ABNORMAL HIGH (ref 0.8–1.2)
Prothrombin Time: 16.3 seconds — ABNORMAL HIGH (ref 11.4–15.2)

## 2021-12-20 LAB — BASIC METABOLIC PANEL
Anion gap: 8 (ref 5–15)
BUN: 23 mg/dL (ref 8–23)
CO2: 25 mmol/L (ref 22–32)
Calcium: 10.7 mg/dL — ABNORMAL HIGH (ref 8.9–10.3)
Chloride: 102 mmol/L (ref 98–111)
Creatinine, Ser: 1.03 mg/dL (ref 0.61–1.24)
GFR, Estimated: >60 mL/min (ref >60)
Glucose, Bld: 145 mg/dL — ABNORMAL HIGH (ref 70–99)
Potassium: 3.3 mmol/L — ABNORMAL LOW (ref 3.5–5.1)
Sodium: 135 mmol/L (ref 135–145)

## 2021-12-20 MED ORDER — WARFARIN SODIUM 7.5 MG PO TABS
7.5000 mg | ORAL_TABLET | Freq: Once | ORAL | Status: AC
  Administered 2021-12-20: 7.5 mg via ORAL
  Filled 2021-12-20: qty 1

## 2021-12-20 MED ORDER — POTASSIUM CHLORIDE CRYS ER 20 MEQ PO TBCR
40.0000 meq | EXTENDED_RELEASE_TABLET | Freq: Once | ORAL | Status: AC
  Administered 2021-12-20: 40 meq via ORAL
  Filled 2021-12-20: qty 2

## 2021-12-20 NOTE — Telephone Encounter (Signed)
Referral placed to the Lipid Clinic.

## 2021-12-20 NOTE — Progress Notes (Addendum)
Rounding Note    Patient Name: [name] Date of Encounter: 12/20/2021  [place] Cardiologist: [name], MD   Subjective   Felt better today.   Inpatient Medications    Scheduled Meds:  amLODipine  10 mg Oral Daily   aspirin EC  81 mg Oral Daily   carvedilol  25 mg Oral BID WC   cloNIDine  0.2 mg Oral BID   cyanocobalamin  1,000 mcg Intramuscular Q1500   doxazosin  4 mg Oral Daily   doxycycline  100 mg Oral Q12H   enoxaparin (LOVENOX) injection  100 mg Subcutaneous Q12H   ezetimibe  10 mg Oral Daily   folic acid  1 mg Oral Daily   hydrochlorothiazide  12.5 mg Oral Daily   influenza vaccine adjuvanted  0.5 mL Intramuscular Tomorrow-1000   losartan  100 mg Oral Daily   lubiprostone  24 mcg Oral BID WC   potassium chloride  40 mEq Oral Once   sodium chloride flush  3 mL Intravenous Q12H   sodium chloride flush  3 mL Intravenous Q12H   Warfarin - Pharmacist Dosing Inpatient   Does not apply q1600   Continuous Infusions:  sodium chloride     PRN Meds: sodium chloride, acetaminophen, liver oil-zinc oxide, ondansetron (ZOFRAN) IV, mouth rinse, senna-docusate, sodium chloride flush   Vital Signs    Vitals:   12/19/21 2336 12/20/21 0113 12/20/21 0407 12/20/21 0753  BP: (!) 144/91  (!) 124/90 (!) 152/99  Pulse: 88 (!) 56 62 77  Resp: '20 20 19 20  '$ Temp: 98.7 F (37.1 C)  98 F (36.7 C) 98.4 F (36.9 C)  TempSrc: Oral  Oral Oral  SpO2: 94%  98% 93%  Weight:      Height:        Intake/Output Summary (Last 24 hours) at 12/20/2021 0909 Last data filed at 12/20/2021 0542 Gross per 24 hour  Intake 250 ml  Output 650 ml  Net -400 ml      12/17/2021    3:20 AM 12/15/2021   11:02 PM 10/27/2021    9:39 AM  Last 3 Weights  Weight (lbs) 255 lb 8 oz 260 lb 6.4 oz 263 lb  Weight (kg) 115.894 kg 118.117 kg 119.296 kg      Telemetry    AFib with intermittent pause (less than 3sc) - Personally Reviewed  ECG    N/A  Physical Exam   GEN: No  acute distress.   Neck: No JVD Cardiac: Irregularly irregular , 2/6 murmurs, rubs, or gallops.  Respiratory: Clear to auscultation bilaterally. GI: Soft, nontender, non-distended  MS: No edema; No deformity. Neuro:  Nonfocal  Psych: Normal affect   Labs    High Sensitivity Troponin:   Recent Labs  Lab 12/14/21 2140 12/14/21 2343 12/15/21 0420 12/15/21 1330 12/16/21 0104  TROPONINIHS 21* 51* 196* 334* 204*     Chemistry Recent Labs  Lab 12/14/21 2140 12/15/21 0420 12/16/21 0104 12/17/21 1635 12/17/21 1643 12/17/21 1646 12/20/21 0108  NA 137 134* 137   < > 139 138 135  K 4.0 3.7 3.6   < > 3.7 3.8 3.3*  CL 103 102 104  --   --   --  102  CO2 '25 22 24  '$ --   --   --  25  GLUCOSE 147* 126* 109*  --   --   --  145*  BUN '20 17 18  '$ --   --   --  23  CREATININE 0.86 0.81 0.96  --   --   --  1.03  CALCIUM 10.6* 10.4* 10.6*  --   --   --  10.7*  PROT 6.5  --   --   --   --   --   --   ALBUMIN 3.4*  --   --   --   --   --   --   AST 22  --   --   --   --   --   --   ALT 20  --   --   --   --   --   --   ALKPHOS 82  --   --   --   --   --   --   BILITOT 0.9  --   --   --   --   --   --   GFRNONAA >60 >60 >60  --   --   --  >60  ANIONGAP '9 10 9  '$ --   --   --  8   < > = values in this interval not displayed.    Lipids No results for input(s): "CHOL", "TRIG", "HDL", "LABVLDL", "LDLCALC", "CHOLHDL" in the last 168 hours.  Hematology Recent Labs  Lab 12/17/21 0218 12/17/21 1635 12/17/21 1646 12/18/21 0102 12/20/21 0108  WBC 8.5  --   --  6.3 7.1  RBC 4.01*  --   --  4.01* 4.19*  HGB 12.3*   < > 12.2* 12.4* 12.9*  HCT 36.7*   < > 36.0* 36.5* 37.0*  MCV 91.5  --   --  91.0 88.3  MCH 30.7  --   --  30.9 30.8  MCHC 33.5  --   --  34.0 34.9  RDW 14.3  --   --  13.8 13.5  PLT 187  --   --  204 241   < > = values in this interval not displayed.   Thyroid  Recent Labs  Lab 12/15/21 0420  TSH 0.579     Radiology    No results found.  Cardiac Studies   Echo  12/15/2021 1. Left ventricular ejection fraction, by estimation, is 55 to 60%. The  left ventricle has normal function. The left ventricle has no regional  wall motion abnormalities. Left ventricular diastolic parameters are  indeterminate.   2. Right ventricular systolic function is normal. The right ventricular  size is normal. Tricuspid regurgitation signal is inadequate for assessing  PA pressure.   3. Left atrial size was moderately dilated.   4. The mitral valve was not well visualized. Trivial mitral valve  regurgitation (MR was reported as worse on last echo but mitral valve  poorly visualized on this echo). No evidence of mitral stenosis.   5. The aortic valve is tricuspid. There is moderate calcification of the  aortic valve. Aortic valve regurgitation is mild. Moderate aortic valve  stenosis. Aortic valve mean gradient measures 25.0 mmHg. Calculated AVA  1.5 cm^2.   6. Aortic dilatation noted. There is mild dilatation of the aortic root  and of the ascending aorta, measuring 40 mm.   7. The inferior vena cava is dilated in size with >50% respiratory  variability, suggesting right atrial pressure of 8 mmHg.   8. The patient was in atrial fibrillation.   RIGHT/LEFT HEART CATH AND CORONARY ANGIOGRAPHY  12/18/2021   Conclusion      Prox LAD to Dist LAD lesion is 55% stenosed.  Mid RCA to Dist RCA lesion is 100% stenosed with 100% stenosed side branch in Acute Mrg.   1st Mrg lesion is 99% stenosed with 70% stenosed side branch in Lat 1st Mrg.   Dist Cx lesion is 70% stenosed with 100% stenosed side branch in 2nd Mrg.   LV end diastolic pressure is moderately elevated.   Hemodynamic findings consistent with mild pulmonary hypertension.   There is mild aortic valve stenosis.   POST-OPERATIVE DIAGNOSIS:  Relatively Normal Right Heart Cath Numbers: RAP mean 9 mmHg, RVP-EDP 37/4-9 mmHg PAP-mean 35/18-26 mmHg; PCWP 19/26 mmHg - 22 mmHg LVEDP-EDP 165/8-18 mmHg; AOP-MAP  150/81-107 mmHg Ao sat 96%, PA sat 65%. ` Cardiac Output 5.94, Cardiac Index 2.56 (mildly reduced Severe multivessel CAD: Small nondominant RCA (<1.5 mm) -100% CTO of the mid vessel Diffusely diseased 40 to 60% LAD from proximal to apical with no obvious focal lesion Large-caliber mostly dominant LCx with 2 major OM branches, 2 PL's and a PDA OM1 has segmental irregular hazy ulcerated appearing likely calcified lesion just proximal to the bifurcation into 2 subbranches (not favorable for PCI, but could be attempted if necessary) OM 2 is CTO flush occlusion (cannot tell if it actually comes off of OM1 or comes off of the LCx as it fills via retrograde filling but not all the way to the parent vessel The AV groove circumflex after occluded OM2 has an eccentric appearing 70% stenosis then normalizes prior to the final 3 branches (LPL 1 LPL 2 and PDA   PLAN OF CARE:  Return to nursing unit for ongoing care.  Okay to restart heparin or Lovenox in the morning 12/18/2021; can dose warfarin tonight Aggressive guideline directed medical therapy for A-fib, CAD Monitor for signs of concerning symptoms for angina or worsening CHF, would otherwise treat the OM and LCx lesions medically for now. Cath films were reviewed with Dr. Martinique to make decision of attempted PCI versus medical therapy.  His recommendation independent mine was also to recommend medical management based on the the difficulty of intervention of the OM branch which is probably the culprit based on the downstream bifurcation.  If he were to have significant symptoms, this could potentially be attempted but will be difficult.  The LCx does have a lesion which may or may not be flow-limiting.  It could also be attempted, if symptoms warranted.  Diagnostic Dominance: Right   Patient Profile     71 y.o. male  with a hx of permanent afib, bradycardia with pause (reduced BB), MR, aortic stenosis, hypertension, hyperlipidemia, OSA, obesity who  presented with complaints of generalized weakness and exertional dyspnea. Cardiology is consulted for elevated troponin.   Assessment & Plan    Elevated troponin / CAD - High-sensitivity troponin 51>> 196>> 334>>204  CT chest abdomen pelvis with extensive coronary calcifications reported.  - Cath showed multivessel CAD as noted above. "Recommend medical management based on the the difficulty of intervention of the OM branch which is probably the culprit based on the downstream bifurcation.  If he were to have significant symptoms, this could potentially be attempted but will be difficult.  The LCx does have a lesion which may or may not be flow-limiting.  It could also be attempted, if symptoms warranted". - Continue ASA, BB and zetia  2. HLD - 07/15/2021: Cholesterol 147; HDL 34.10; LDL Cholesterol 92; Triglycerides 103.0; VLDL 20.6  - Lipoprotein (a) 123 - Pending lipid clinic evaluation   3. Permanent afib - Rate stable on Coreg '25mg'$   BID. Noted intermittent pause but not > 3 sec. However did have prior hx requiring reducing dose. Concern for underlying sleep apnea. Consider outpatient study.  - He did received vit K. On warfarin and lovenox. He does not want to go home on Lovenox bridge. States he lives alone and does not want to inject Lovenox by him self.  - Warfarin management by pharmacy. Checking cost of Eliquis/Xarelto.   4. HTN - BP intermittently up - Continue current therapy.   5. Constipation - Per primary   6. Pauses: up to 3 second pauses overnight, likely due to OSA  Cardiology will sign off. Call with question. Follow up with Dr. Harrington Challenger 12/4. Will need close follow up in coumadin clinic based on pharmacist recommendation at discharge.   For questions or updates, please contact [place] Please consult [email or web address] for contact info under      SignedLeanor Kail, PA  12/20/2021, 9:09 AM     Patient seen and examined.  Agree with above  documentation.  On exam, patient is alert and oriented, irregular rhythm, normal rate, no murmurs, lungs CTAB, no LE edema or JVD.  71 year old male with history of aortic stenosis, atrial fibrillation, hypertension who presented with NSTEMI.  NSTEMI: Troponin peak 334.  Reported exertional dyspnea, no chest pain.  Cath 10/13 showed CTO mid RCA (nondominant), diffusely diseased 40 to 60% LAD, 99% OM1 lesion, CTO OM2, 70% mid LCx.  Medical management recommended.  RHC showed RA 9, RV 37/4, PA 35/18/26, PCWP 22, LVEDP 18, CI 2.6.  Echocardiogram 12/15/2021 showed EF 55 to 60%, normal RV function, moderate left atrial enlargement, moderate aortic stenosis, mild AI. -Continue aspirin -Unable to tolerate statin.  Plan outpatient lipid clinic referral.  Continue Zetia -Continue carvedilol  Aortic stenosis: Moderate on echo, will monitor  Permanent atrial fibrillation: On Coumadin, has not been on NOAC due to cost.  Rate control with Coreg 25 mg twice daily.  Bridging with Lovenox while subtherapeutic  Hypertension: On amlodipine 10 mg daily, Coreg 25 mg twice daily, clonidine 0.2 mg twice daily, HCTZ 12.5 mg daily, losartan 100 mg daily  Hyperlipidemia: Unable to tolerate statins.  We will does not think he can do injections with PCSK9 inhibitor.  Recommend referral to lipid clinic on discharge to evaluate for inclisiran  Pauses: up to 3 second pauses overnight, likely due to OSA

## 2021-12-20 NOTE — Progress Notes (Signed)
Physical Therapy Treatment Patient Details Name: [name] MRN: [id number] DOB: 17-Oct-1950 Today's Date: 12/20/2021   History of Present Illness Pt is 71 yo male admitted on 12/14/21 with indigestion, weakness, elevated troponin.  Heart cath on 10/13 and significant for severe multivessel CAD. Cardiology recommending medical management rather than PCI secondary to difficulty of intervention of the OM branch.. Pt with hx of HTN, aortic stenosis, afib.    PT Comments    Pt making good progress today but did fatigue easily.  He tolerated about 50' of ambulation well with min A to steady, but then began to fatigue and had difficulty making turn to sit last 2' and HR increased to 145 bpm max.  Denied any pain and quickly recovered. Continue to progress as able.  Did note that [facility] declined pt , so updated to SNF.     Recommendations for follow up therapy are one component of a multi-disciplinary discharge planning process, led by the attending physician.  Recommendations may be updated based on patient status, additional functional criteria and insurance authorization.  Follow Up Recommendations  Skilled nursing-short term rehab (<3 hours/day) ([facility] declined) Can patient physically be transported by private vehicle: Yes   Assistance Recommended at Discharge Frequent or constant Supervision/Assistance  Patient can return home with the following A lot of help with walking and/or transfers;A lot of help with bathing/dressing/bathroom;Assistance with cooking/housework;Help with stairs or ramp for entrance   Equipment Recommendations  None recommended by PT    Recommendations for Other Services       Precautions / Restrictions Precautions Precautions: Fall     Mobility  Bed Mobility Overal bed mobility: Needs Assistance Bed Mobility: Sit to Supine       Sit to supine: Min assist   General bed mobility comments: min A for legs and increased time; use of bed rails to reposition     Transfers Overall transfer level: Needs assistance Equipment used: Rolling walker (2 wheels) Transfers: Sit to/from Stand Sit to Stand: Min assist, From elevated surface           General transfer comment: Pt likes RW turned backward and holds bar to stabilize with standing and then turns RW.  Performed x 5 during session with cues for hand placement and controlled descent    Ambulation/Gait Ambulation/Gait assistance: Min assist Gait Distance (Feet): 60 Feet Assistive device: Rolling walker (2 wheels) Gait Pattern/deviations: Step-to pattern, Decreased stride length, Trunk flexed Gait velocity: decreased     General Gait Details: Pt with very flexed trunk and RW forward - and had difficulty correcting due to fatigue.  He ambulated about 70'  with HR 110-117 bpm (80's-90's bpm rest) then standing rest break with HR increasing to 130's.  Pt then about 2' away from bed pt completely fatigued with HR up to 145 bpm.  Grabbed chair for safety as pt even having difficulty making turn to sit but was eventually able to turn and sit on bed.  Pt quickly recovering with HR back to 90's in a few seconds.   Stairs             Wheelchair Mobility    Modified Rankin (Stroke Patients Only)       Balance Overall balance assessment: Needs assistance Sitting-balance support: Feet supported, No upper extremity supported Sitting balance-Leahy Scale: Fair     Standing balance support: Bilateral upper extremity supported, Reliant on assistive device for balance Standing balance-Leahy Scale: Poor  Cognition Arousal/Alertness: Awake/alert Behavior During Therapy: WFL for tasks assessed/performed Overall Cognitive Status: Within Functional Limits for tasks assessed                                          Exercises General Exercises - Lower Extremity Ankle Circles/Pumps: AROM, 10 reps, Both, Seated Long Arc Quad: AROM,  Both, 10 reps, Seated Hip Flexion/Marching: AROM, Both, 10 reps, Seated    General Comments        Pertinent Vitals/Pain Pain Assessment Pain Assessment: No/denies pain    Home Living                          Prior Function            PT Goals (current goals can now be found in the care plan section) Progress towards PT goals: Progressing toward goals    Frequency    Min 3X/week      PT Plan Discharge plan needs to be updated    Co-evaluation              AM-PAC PT "6 Clicks" Mobility   Outcome Measure  Help needed turning from your back to your side while in a flat bed without using bedrails?: A Little Help needed moving from lying on your back to sitting on the side of a flat bed without using bedrails?: A Little Help needed moving to and from a bed to a chair (including a wheelchair)?: A Little Help needed standing up from a chair using your arms (e.g., wheelchair or bedside chair)?: A Little Help needed to walk in hospital room?: A Lot Help needed climbing 3-5 steps with a railing? : Total 6 Click Score: 15    End of Session Equipment Utilized During Treatment: Gait belt Activity Tolerance: Patient limited by fatigue Patient left: in bed;with call bell/phone within reach;with bed alarm set Nurse Communication: Mobility status PT Visit Diagnosis: Other abnormalities of gait and mobility (R26.89);Muscle weakness (generalized) (M62.81)     Time: 6659-9357 PT Time Calculation (min) (ACUTE ONLY): 23 min  Charges:  $Gait Training: 8-22 mins $Therapeutic Exercise: 8-22 mins                     Abran Richard, PT Acute Rehab [facility] [place] 12/20/2021, 3:14 PM

## 2021-12-20 NOTE — Addendum Note (Signed)
Addended by: Stephani Police on: 12/20/2021 08:45 AM   Modules accepted: Orders

## 2021-12-20 NOTE — TOC Progression Note (Signed)
Transition of Care [facility]) - Progression Note    Patient Details  Name: [name] MRN: [id number] Date of Birth: [birth year]  Transition of Care [facility]) CM/SW [place], Myrtle Phone Number: 12/20/2021, 10:28 AM  Clinical Narrative:     CSW will provide patient with bed offers and start insurance authorization  Thurmond Butts, MSW, LCSW Clinical Social Worker    Expected Discharge Plan: [place] Barriers to Discharge: Continued Medical Work up, Ship broker, SNF Pending bed offer  Expected Discharge Plan and Services Expected Discharge Plan: [place] arrangements for the past 2 months: Apartment                                       Social Determinants of Health (SDOH) Interventions    Readmission Risk Interventions     No data to display

## 2021-12-20 NOTE — Progress Notes (Signed)
Patient HR is sustained at 40s to 50s BPM,atrial firillation with some pauses. Pt is resting and asymptomatic, no complaint of pain,BP 120/78 O2 sat is 97% room air.Dr. Bridgett Larsson made aware. Will continue to monitor.

## 2021-12-20 NOTE — TOC Benefit Eligibility Note (Addendum)
Patient Teacher, English as a foreign language completed.    The patient is currently admitted and upon discharge could be taking Eliquis 5 mg.  The current 30 day co-pay is $45.00.   The patient is currently admitted and upon discharge could be taking Xarelto 20 mg.  The current 30 day co-pay is $45.00.   The patient is insured through [place], [place] Patient Advocate Specialist [place] Patient Advocate Team Direct Number: [phone]  Fax: [phone]

## 2021-12-20 NOTE — Progress Notes (Signed)
ANTICOAGULATION CONSULT NOTE - Follow-up  Pharmacy Consult for Warfarin/Lovenox Indication:  afib, CAD  Allergies  Allergen Reactions   Statins Other (See Comments)    "Muscles were messed up"    Penicillins Other (See Comments)    Childhood reaction. Unknown per Pt, cannot remember ever trying beta lactams     Patient Measurements: Height: '5\' 10"'$  (177.8 cm) Weight: 115.9 kg (255 lb 8 oz) IBW/kg (Calculated) : 73  Vital Signs: Temp: 97.6 F (36.4 C) (10/16 1050) Temp Source: Oral (10/16 1050) BP: 116/87 (10/16 1050) Pulse Rate: 72 (10/16 1050)  Labs: Recent Labs    12/17/21 1646 12/18/21 0102 12/19/21 0126 12/19/21 1451 12/20/21 0108  HGB 12.2* 12.4*  --   --  12.9*  HCT 36.0* 36.5*  --   --  37.0*  PLT  --  204  --   --  241  LABPROT  --  15.2 15.2  --  16.3*  INR  --  1.2 1.2  --  1.3*  HEPRLOWMOCWT  --   --   --  1.15  --   CREATININE  --   --   --   --  1.03     Estimated Creatinine Clearance: 83.9 mL/min (by C-G formula based on SCr of 1.03 mg/dL).   Medical History: Past Medical History:  Diagnosis Date   Adenomatous colon polyp 07/2003   Heart valve problem    Dr [name]   Hypertension    Mitral valve disorders(424.0)    Nonrheumatic aortic valve stenosis 01/14/2021   Echo 9/22: EF 60-65, small pericardial effusion, mild AS (mean gradient 16 mmHg), trivial AI, mild MR (MR is eccentric - may be underestimated) // Echo 10/2021: EF 60-65, mod calcification of AV, trivial AI, mod-severe AS (DI 0.25, V-max 350 cm/s, mean 28.8), no RWMA, NL RVSF, severe LAE, mild RAE, small effusion w/o tamponade, ?mild-moderate MR (difficult due to eccentric jet)   Nonrheumatic mitral valve regurgitation 01/14/2021   Echo 10/2021: EF 60-65, moderate calcification of the aortic valve, trivial AI, moderate to severe aortic stenosis (DI 0.25, V-max 350 cm/s, mean gradient 28.8), no RWMA, normal RVSF, severe LAE, mild RAE, small pericardial effusion without tamponade, suspect  mild-moderate MR (difficult to qualify degree due to eccentric jet with coanda affect)   Obesity    OSA (obstructive sleep apnea)    cpap   Other and unspecified hyperlipidemia    Permanent atrial fibrillation ([place])    [facility]io Monitor 5/22: AFib - Avg HR 55; slowest 32; longest pause 3.9 sec; occ PVCs, 4 beat NSVT   Sleep apnea     Medications:  Medications Prior to Admission  Medication Sig Dispense Refill Last Dose   acetaminophen (TYLENOL) 325 MG tablet Take 650 mg by mouth daily as needed (pain).   3 months   amLODipine (NORVASC) 10 MG tablet Take 1 tablet (10 mg total) by mouth daily. 90 tablet 3 12/14/2021   carvedilol (COREG) 12.5 MG tablet TAKE 1 AND 1/2 TABLET BY MOUTH TWO TIMES A DAY (Patient taking differently: Take 18.75 mg by mouth 2 (two) times daily with a meal.) 270 tablet 3 12/14/2021 at 1815-1830   cloNIDine (CATAPRES) 0.2 MG tablet Take 1 tablet (0.2 mg total) by mouth 2 (two) times daily. 180 tablet 3 12/14/2021   doxazosin (CARDURA) 4 MG tablet Take 1 tablet (4 mg total) by mouth daily. 90 tablet 3 12/14/2021   ezetimibe (ZETIA) 10 MG tablet Take 1 tablet (10 mg total) by mouth daily.  90 tablet 3 12/14/2021   hydrochlorothiazide (MICROZIDE) 12.5 MG capsule Take 1 capsule (12.5 mg total) by mouth daily. (Patient taking differently: Take 12.5 mg by mouth every evening.) 90 capsule 3 12/14/2021   losartan (COZAAR) 100 MG tablet Take 1 tablet (100 mg total) by mouth daily. 90 tablet 3 12/14/2021   lubiprostone (AMITIZA) 24 MCG capsule Take 1 capsule (24 mcg total) by mouth 2 (two) times daily. 180 capsule 3 12/14/2021   meloxicam (MOBIC) 15 MG tablet Take 1 tablet (15 mg total) by mouth daily. 90 tablet 3 12/14/2021   OMEGA-3 FATTY ACIDS PO Take 1 capsule by mouth in the morning and at bedtime.   12/14/2021   warfarin (COUMADIN) 5 MG tablet TAKE 1 TO 1 AND 1/2 TABLETS BY MOUTH DAILY AS DIRECTED BY COUMADIN CLINIC (Patient taking differently: Take 7.5 mg by mouth every evening.)  50 tablet 3 12/14/2021 at 1815   Iron, Ferrous Sulfate, 325 (65 Fe) MG TABS Take 325 mg by mouth daily. (Patient not taking: Reported on 12/15/2021) 90 tablet 3 Not Taking   ketoconazole (NIZORAL) 2 % cream Apply 1 application topically 2 (two) times daily as needed for irritation. (Patient not taking: Reported on 12/15/2021) 30 g 2 Completed Course    Assessment: 71 y.o. M presents with CP. S/p cath 10/13 which showed multivessel CAD. Plan for aggressive medical management instead of PCI. Pt on warfarin PTA for afib. Warfarin held for cath since 10/10 and reversed with vit K 2.'5mg'$  10/12 and '5mg'$  10/13. Warfarin restarted on 10/13. Note, patient also on aspirin, and doxycycline (10/11-16)  CBC stable. INR remains Subtherapeutic at 1.3 today. No bleeding reported. Spoke to patient about potentially switching to Eliquis and provided copay information. Patient would like to think about it.  Home dose: 7.'5mg'$  daily    Goal of Therapy:  INR 2-3, anti-Xa level 0.6-1 (4 hrs post Lovenox) Monitor platelets by anticoagulation protocol: Yes   Plan:  Continue lovenox 100 mg q12 Repeat warfarin 7.5 mg po x 1 Monitor daily INR, CBC, clinical course, s/sx of bleed, PO intake/diet, Drug-Drug Interactions F/u patient preference for warfarin vs. Eliquis     Thank you for allowing [place] to participate in this patients care. Jens Som, PharmD 12/20/2021 11:16 AM  **Pharmacist phone directory can be found on [email or web address] listed under [place]**

## 2021-12-20 NOTE — TOC Progression Note (Signed)
Transition of Care [facility]) - Progression Note    Patient Details  Name: [name] MRN: [id number] Date of Birth: October 08, 1950  Transition [facility]) CM/SW Contact  Vinie Sill, [place] Phone Number: 12/20/2021, 12:36 PM  Clinical Narrative:     CSW met with patient and his nephew at bedside. CSW introduced self and explained role. CSW provided patient and family with bed offers. CSW waiting on SNF choice.     Expected Discharge Plan: [place] Barriers to Discharge: Continued Medical Work up, Ship broker, SNF Pending bed offer  Expected Discharge Plan and Services Expected Discharge Plan: [place] arrangements for the past 2 months: Apartment                                       Social Determinants of Health (SDOH) Interventions    Readmission Risk Interventions     No data to display

## 2021-12-20 NOTE — Telephone Encounter (Signed)
Pharmacy Patient Advocate Encounter  Insurance verification completed.    The patient is insured through [employer] Part D   The patient is currently admitted and ran test claims for the following: Eliquis.  Copays and coinsurance results were relayed to Inpatient clinical team.      

## 2021-12-20 NOTE — Progress Notes (Signed)
PROGRESS NOTE    DERICO MITTON  SVX:[id number] DOB: [birth year] DOA: 12/14/2021 PCP: [name], MD   Brief Narrative: [name] is a 71 y.o. male wit a history of hypertension, aortic stenosis and atrial fibrillation on Coumadin who presented secondary weakness and dyspnea on exertion. Associated elevated troponin. Cardiology consulted. L/RHC performed on 10/13 which was significant for severe disease and recommendation for medical management.   Assessment and Plan:  Generalized weakness Dyspnea on exertion Nonspecific. Possibly related to bradycardia vs ACS vs infection vs aortic stenosis. Troponin elevated to 196 this morning. No symptoms at rest. Heart rate as low as 40s while on Coreg. EKG without strong evidence for ACS. Cardiology consulted and recommended L/RHC, which was performed on 10/13 and significant for severe multivessel CAD. Cardiology recommending medical management rather than PCI secondary to difficulty of intervention of the OM branch.  Severe CAD As mentioned above. -Cardiology recommendations: aspirin   Elevated troponin Hs Troponin trend of 21 > 51 > 196 > 334 > 204. No chest pain since admission but continued to have exertional dyspnea. As mentioned above.   Abdominal wall dermatitis Cellulitis Distribution seems more consistent with intertrigo than with cellulitis; CT abdomen/pelvis suggests possible cellulitis. Associated leukocytosis. No fever, tenderness or warmth of the area. Dermatitis is confined to skin folds of lower panus and inguinal areas. Patient started on and completed doxycycline -Keep area clean and dry -Barrier cream   Vitamin B12 deficiency Folate deficiency Vitamin B12 level of 74 and folate of 5. -Vitamin Q33 and folic acid supplementation   Aortic stenosis Transthoracic Echocardiogram significant for moderate stenosis with mild regurgitation.   Permanent atrial fibrillation Coumadin held for heart catheterization and has  been resumed. Coreg increased to 25 mg BID by cardiology. -Continue Coreg -Per cardiology, Coumadin with Lovenox   Primary hypertension -Continue Coreg, amlodipine, losartan, hydrochlorothiazide  Constipation Chronic issue. Patient had concerns about constipation but declines enema and suppository. -Continue Amitiza -Continue MiraLAX  OSA -CPAP qHS   DVT prophylaxis: Coumadin/Lovenox Code Status:   Code Status: Full Code Family Communication: Nephew at bedside Disposition Plan: Discharge to SNF when bed is available. Medically stable for discharge once cardiology signs off.   Consultants:  Cardiology  Procedures:  Transthoracic Echocardiogram  Antimicrobials: Doxycycline Bactrim    Subjective: Patient does not want MiraLAX. Questions about Lovenox. No other concerns. Per nursing, overnight patient with mild bradycardia while asleep. Asymptomatic.  Objective: BP (!) 152/99 (BP Location: Left Arm)   Pulse 77   Temp 98.4 F (36.9 C) (Oral)   Resp 20   Ht '5\' 10"'$  (1.778 m)   Wt 115.9 kg   SpO2 93%   BMI 36.66 kg/m   Examination:  General exam: Appears calm and comfortable Respiratory system: Clear to auscultation. Respiratory effort normal. Cardiovascular system: S1 & S2 heard, irregular rhythm with normal rate. 1/6 systolic murmur Gastrointestinal system: Abdomen is protuberant, soft and nontender. Normal bowel sounds heard. Central nervous system: Alert and oriented. No focal neurological deficits. Musculoskeletal: No calf tenderness Skin: No cyanosis. No rashes Psychiatry: Judgement and insight appear normal. Mood & affect appropriate.    Data Reviewed: I have personally reviewed following labs and imaging studies  CBC Lab Results  Component Value Date   WBC 7.1 12/20/2021   RBC 4.19 (L) 12/20/2021   HGB 12.9 (L) 12/20/2021   HCT 37.0 (L) 12/20/2021   MCV 88.3 12/20/2021   MCH 30.8 12/20/2021   PLT 241 12/20/2021   MCHC 34.9  12/20/2021   RDW 13.5  12/20/2021   LYMPHSABS 0.5 (L) 12/14/2021   MONOABS 0.7 12/14/2021   EOSABS 0.0 12/14/2021   BASOSABS 0.0 71/24/5809     Last metabolic panel Lab Results  Component Value Date   NA 135 12/20/2021   K 3.3 (L) 12/20/2021   CL 102 12/20/2021   CO2 25 12/20/2021   BUN 23 12/20/2021   CREATININE 1.03 12/20/2021   GLUCOSE 145 (H) 12/20/2021   GFRNONAA >60 12/20/2021   GFRAA 72 04/17/2019   CALCIUM 10.7 (H) 12/20/2021   PROT 6.5 12/14/2021   ALBUMIN 3.4 (L) 12/14/2021   BILITOT 0.9 12/14/2021   ALKPHOS 82 12/14/2021   AST 22 12/14/2021   ALT 20 12/14/2021   ANIONGAP 8 12/20/2021    GFR: Estimated Creatinine Clearance: 83.9 mL/min (by C-G formula based on SCr of 1.03 mg/dL).  Recent Results (from the past 240 hour(s))  Urine Culture     Status: None   Collection Time: 12/14/21 10:22 PM   Specimen: Urine, Clean Catch  Result Value Ref Range Status   Specimen Description URINE, CLEAN CATCH  Final   Special Requests NONE  Final   Culture   Final    NO GROWTH Performed at [facility], [address], [place], [place] [address]    Report Status 12/16/2021 FINAL  Final  SARS Coronavirus 2 by RT PCR (hospital order, performed in [facility] hospital lab) *cepheid single result test* Anterior Nasal Swab     Status: None   Collection Time: 12/14/21 11:43 PM   Specimen: Anterior Nasal Swab  Result Value Ref Range Status   SARS Coronavirus 2 by RT PCR NEGATIVE NEGATIVE Final    Comment: (NOTE) SARS-CoV-2 target nucleic acids are NOT DETECTED.  The SARS-CoV-2 RNA is generally detectable in upper and lower respiratory specimens during the acute phase of infection. The lowest concentration of SARS-CoV-2 viral copies this assay can detect is 250 copies / mL. A negative result does not preclude SARS-CoV-2 infection and should not be used as the sole basis for treatment or other patient management decisions.  A negative result may occur with improper specimen collection /  handling, submission of specimen other than nasopharyngeal swab, presence of viral mutation(s) within the areas targeted by this assay, and inadequate number of viral copies (<250 copies / mL). A negative result must be combined with clinical observations, patient history, and epidemiological information.  Fact Sheet for Patients:   https://www.patel.info/  Fact Sheet for Healthcare Providers: https://Stantz.com/  This test is not yet approved or  cleared by the [place] FDA and has been authorized for detection and/or diagnosis of SARS-CoV-2 by FDA under an Emergency Use Authorization (EUA).  This EUA will remain in effect (meaning this test can be used) for the duration of the COVID-19 declaration under Section 564(b)(1) of the Act, 21 U.S.C. section 360bbb-3(b)(1), unless the authorization is terminated or revoked sooner.  Performed at [facility], [place] [address] [place], [place] [address]   Respiratory (~20 pathogens) panel by PCR     Status: None   Collection Time: 12/15/21  1:30 PM   Specimen: Nasopharyngeal Swab; Respiratory  Result Value Ref Range Status   Adenovirus NOT DETECTED NOT DETECTED Final   Coronavirus 229E NOT DETECTED NOT DETECTED Final    Comment: (NOTE) The Coronavirus on the Respiratory Panel, DOES NOT test for the novel  Coronavirus (2019 nCoV)    Coronavirus HKU1 NOT DETECTED NOT DETECTED Final  Coronavirus NL63 NOT DETECTED NOT DETECTED Final   Coronavirus OC43 NOT DETECTED NOT DETECTED Final   Metapneumovirus NOT DETECTED NOT DETECTED Final   Rhinovirus / Enterovirus NOT DETECTED NOT DETECTED Final   Influenza A NOT DETECTED NOT DETECTED Final   Influenza B NOT DETECTED NOT DETECTED Final   Parainfluenza Virus 1 NOT DETECTED NOT DETECTED Final   Parainfluenza Virus 2 NOT DETECTED NOT DETECTED Final   Parainfluenza Virus 3 NOT DETECTED NOT DETECTED Final   Parainfluenza Virus 4 NOT DETECTED NOT  DETECTED Final   Respiratory Syncytial Virus NOT DETECTED NOT DETECTED Final   Bordetella pertussis NOT DETECTED NOT DETECTED Final   Bordetella Parapertussis NOT DETECTED NOT DETECTED Final   Chlamydophila pneumoniae NOT DETECTED NOT DETECTED Final   Mycoplasma pneumoniae NOT DETECTED NOT DETECTED Final    Comment: Performed at [facility], [place] [address] [place], [place] [address]      Radiology Studies: No results found.    LOS: 3 days    Cordelia Poche, MD [facility] 12/20/2021, 8:17 AM   If 7PM-7AM, please contact night-coverage [email or web address]

## 2021-12-21 DIAGNOSIS — R7989 Other specified abnormal findings of blood chemistry: Secondary | ICD-10-CM | POA: Diagnosis not present

## 2021-12-21 DIAGNOSIS — G4733 Obstructive sleep apnea (adult) (pediatric): Secondary | ICD-10-CM | POA: Diagnosis not present

## 2021-12-21 DIAGNOSIS — I952 Hypotension due to drugs: Secondary | ICD-10-CM | POA: Diagnosis not present

## 2021-12-21 DIAGNOSIS — I4821 Permanent atrial fibrillation: Secondary | ICD-10-CM | POA: Diagnosis not present

## 2021-12-21 LAB — PROTIME-INR
INR: 1.5 — ABNORMAL HIGH (ref 0.8–1.2)
Prothrombin Time: 17.9 seconds — ABNORMAL HIGH (ref 11.4–15.2)

## 2021-12-21 LAB — POTASSIUM: Potassium: 4 mmol/L (ref 3.5–5.1)

## 2021-12-21 MED ORDER — CLONIDINE HCL 0.2 MG PO TABS
0.2000 mg | ORAL_TABLET | Freq: Two times a day (BID) | ORAL | Status: DC
  Administered 2021-12-22: 0.2 mg via ORAL
  Filled 2021-12-21: qty 1

## 2021-12-21 MED ORDER — SODIUM CHLORIDE 0.9 % IV BOLUS
500.0000 mL | Freq: Once | INTRAVENOUS | Status: AC
  Administered 2021-12-21: 500 mL via INTRAVENOUS

## 2021-12-21 MED ORDER — CARVEDILOL 12.5 MG PO TABS
12.5000 mg | ORAL_TABLET | Freq: Two times a day (BID) | ORAL | Status: DC
  Administered 2021-12-22: 12.5 mg via ORAL
  Filled 2021-12-21: qty 1

## 2021-12-21 MED ORDER — POLYETHYLENE GLYCOL 3350 17 G PO PACK
17.0000 g | PACK | Freq: Two times a day (BID) | ORAL | Status: DC
  Administered 2021-12-21: 17 g via ORAL
  Filled 2021-12-21: qty 1

## 2021-12-21 MED ORDER — GLUCAGON HCL RDNA (DIAGNOSTIC) 1 MG IJ SOLR
5.0000 mg | Freq: Once | INTRAVENOUS | Status: AC
  Administered 2021-12-21: 5 mg via INTRAVENOUS
  Filled 2021-12-21: qty 5

## 2021-12-21 MED ORDER — WARFARIN SODIUM 10 MG PO TABS
10.0000 mg | ORAL_TABLET | Freq: Once | ORAL | Status: AC
  Administered 2021-12-21: 10 mg via ORAL
  Filled 2021-12-21: qty 1

## 2021-12-21 MED ORDER — SENNOSIDES-DOCUSATE SODIUM 8.6-50 MG PO TABS
1.0000 | ORAL_TABLET | Freq: Two times a day (BID) | ORAL | Status: DC
  Administered 2021-12-21: 1 via ORAL
  Filled 2021-12-21: qty 1

## 2021-12-21 MED ORDER — SODIUM CHLORIDE 0.9 % IV BOLUS
250.0000 mL | Freq: Once | INTRAVENOUS | Status: AC
  Administered 2021-12-21: 250 mL via INTRAVENOUS

## 2021-12-21 NOTE — Progress Notes (Addendum)
PROGRESS NOTE    [name]  QQP:[id number] DOB: 06/07/1950 DOA: 12/14/2021 PCP: [name], MD   Brief Narrative: [name] is a 71 y.o. male wit a history of hypertension, aortic stenosis and atrial fibrillation on Coumadin who presented secondary weakness and dyspnea on exertion. Associated elevated troponin. Cardiology consulted. L/RHC performed on 10/13 which was significant for severe disease and recommendation for medical management.   Assessment and Plan:  Generalized weakness Dyspnea on exertion Nonspecific. Possibly related to bradycardia vs ACS vs infection vs aortic stenosis. Troponin elevated to 196 this morning. No symptoms at rest. Heart rate as low as 40s while on Coreg. EKG without strong evidence for ACS. Cardiology consulted and recommended L/RHC, which was performed on 10/13 and significant for severe multivessel CAD. Cardiology recommending medical management rather than PCI secondary to difficulty of intervention of the OM branch.  Severe CAD As mentioned above. -Cardiology recommendations: aspirin   Elevated troponin Hs Troponin trend of 21 > 51 > 196 > 334 > 204. No chest pain since admission but continued to have exertional dyspnea. As mentioned above.   Abdominal wall dermatitis Cellulitis Distribution seems more consistent with intertrigo than with cellulitis; CT abdomen/pelvis suggests possible cellulitis. Associated leukocytosis. No fever, tenderness or warmth of the area. Dermatitis is confined to skin folds of lower panus and inguinal areas. Patient started on and completed doxycycline -Keep area clean and dry -Barrier cream  Hypotension Appears to be secondary to likely vasovagal episode. Systolic in 71I-45Y with HR of 67 and no symptoms per patient. Patient has also had antihypertensives increased recently which may be contributing. -250 mL bolus and recheck BP in 15 minutes -Hold PM clonidine this evening and amlodipine in  AM  Addendum: Patient with continued hypotension with associated bradycardia down to 40s. Responsive but somewhat lethargic at times. 500 mL bolus given in addition to glucagon 5 mg IV bolus with eventual improvement in blood pressure and heart rate. Coreg discontinued.     Vitamin B12 deficiency Folate deficiency Vitamin B12 level of 74 and folate of 5. -Vitamin K99 and folic acid supplementation   Aortic stenosis Transthoracic Echocardiogram significant for moderate stenosis with mild regurgitation.   Permanent atrial fibrillation Coumadin held for heart catheterization and has been resumed. Coreg increased to 25 mg BID by cardiology. -Continue Coreg -Per cardiology, Coumadin with Lovenox   Primary hypertension -Continue Coreg, losartan, hydrochlorothiazide -Hold PM clonidine dose this evening and hold amlodipine tomorrow morning secondary to hypotension  Constipation Chronic issue. Patient had concerns about constipation but declines enema and suppository. -Continue Amitiza -Continue MiraLAX  OSA -CPAP qHS   DVT prophylaxis: Coumadin/Lovenox Code Status:   Code Status: Full Code Family Communication: None at bedside Disposition Plan: Discharge to SNF when bed is available. Medically stable likely in 1-2 days now that patient has developed hypotension on antihypertensives   Consultants:  Cardiology  Procedures:  Transthoracic Echocardiogram  Antimicrobials: Doxycycline Bactrim    Subjective: Patient reports no issues this morning. No chest pain or dyspnea.  Objective: BP (!) 150/89 (BP Location: Left Arm)   Pulse 70   Temp 98 F (36.7 C) (Oral)   Resp 13   Ht '5\' 10"'$  (1.778 m)   Wt 115.9 kg   SpO2 100%   BMI 36.66 kg/m   Examination:  General exam: Appears calm and comfortable Respiratory system: Clear to auscultation. Respiratory effort normal. Cardiovascular system: S1 & S2 heard. Systolic murmur Gastrointestinal system: Abdomen is protuberant,  non-distended, soft  and nontender. Normal bowel sounds heard. Central nervous system: Alert and oriented. No focal neurological deficits. Musculoskeletal: No calf tenderness Skin: No cyanosis. No rashes Psychiatry: Judgement and insight appear normal. Mood & affect appropriate.    Data Reviewed: I have personally reviewed following labs and imaging studies  CBC Lab Results  Component Value Date   WBC 7.1 12/20/2021   RBC 4.19 (L) 12/20/2021   HGB 12.9 (L) 12/20/2021   HCT 37.0 (L) 12/20/2021   MCV 88.3 12/20/2021   MCH 30.8 12/20/2021   PLT 241 12/20/2021   MCHC 34.9 12/20/2021   RDW 13.5 12/20/2021   LYMPHSABS 0.5 (L) 12/14/2021   MONOABS 0.7 12/14/2021   EOSABS 0.0 12/14/2021   BASOSABS 0.0 75/12/2583     Last metabolic panel Lab Results  Component Value Date   NA 135 12/20/2021   K 3.3 (L) 12/20/2021   CL 102 12/20/2021   CO2 25 12/20/2021   BUN 23 12/20/2021   CREATININE 1.03 12/20/2021   GLUCOSE 145 (H) 12/20/2021   GFRNONAA >60 12/20/2021   GFRAA 72 04/17/2019   CALCIUM 10.7 (H) 12/20/2021   PROT 6.5 12/14/2021   ALBUMIN 3.4 (L) 12/14/2021   BILITOT 0.9 12/14/2021   ALKPHOS 82 12/14/2021   AST 22 12/14/2021   ALT 20 12/14/2021   ANIONGAP 8 12/20/2021    GFR: Estimated Creatinine Clearance: 83.9 mL/min (by C-G formula based on SCr of 1.03 mg/dL).  Recent Results (from the past 240 hour(s))  Urine Culture     Status: None   Collection Time: 12/14/21 10:22 PM   Specimen: Urine, Clean Catch  Result Value Ref Range Status   Specimen Description URINE, CLEAN CATCH  Final   Special Requests NONE  Final   Culture   Final    NO GROWTH Performed at [facility], [address] [place], [place] [address]    Report Status 12/16/2021 FINAL  Final  SARS Coronavirus 2 by RT PCR (hospital order, performed in [facility] hospital lab) *cepheid single result test* Anterior Nasal Swab     Status: None   Collection Time: 12/14/21 11:43 PM   Specimen:  Anterior Nasal Swab  Result Value Ref Range Status   SARS Coronavirus 2 by RT PCR NEGATIVE NEGATIVE Final    Comment: (NOTE) SARS-CoV-2 target nucleic acids are NOT DETECTED.  The SARS-CoV-2 RNA is generally detectable in upper and lower respiratory specimens during the acute phase of infection. The lowest concentration of SARS-CoV-2 viral copies this assay can detect is 250 copies / mL. A negative result does not preclude SARS-CoV-2 infection and should not be used as the sole basis for treatment or other patient management decisions.  A negative result may occur with improper specimen collection / handling, submission of specimen other than nasopharyngeal swab, presence of viral mutation(s) within the areas targeted by this assay, and inadequate number of viral copies (<250 copies / mL). A negative result must be combined with clinical observations, patient history, and epidemiological information.  Fact Sheet for Patients:   https://www.patel.info/  Fact Sheet for Healthcare Providers: https://Cunanan.com/  This test is not yet approved or  cleared by the [place] FDA and has been authorized for detection and/or diagnosis of SARS-CoV-2 by FDA under an Emergency Use Authorization (EUA).  This EUA will remain in effect (meaning this test can be used) for the duration of the COVID-19 declaration under Section 564(b)(1) of the Act, 21 U.S.C. section 360bbb-3(b)(1), unless the authorization is terminated or revoked  sooner.  Performed at [facility], [place] [address] [place], [place] [address]   Respiratory (~20 pathogens) panel by PCR     Status: None   Collection Time: 12/15/21  1:30 PM   Specimen: Nasopharyngeal Swab; Respiratory  Result Value Ref Range Status   Adenovirus NOT DETECTED NOT DETECTED Final   Coronavirus 229E NOT DETECTED NOT DETECTED Final    Comment: (NOTE) The Coronavirus on the Respiratory Panel, DOES NOT  test for the novel  Coronavirus (2019 nCoV)    Coronavirus HKU1 NOT DETECTED NOT DETECTED Final   Coronavirus NL63 NOT DETECTED NOT DETECTED Final   Coronavirus OC43 NOT DETECTED NOT DETECTED Final   Metapneumovirus NOT DETECTED NOT DETECTED Final   Rhinovirus / Enterovirus NOT DETECTED NOT DETECTED Final   Influenza A NOT DETECTED NOT DETECTED Final   Influenza B NOT DETECTED NOT DETECTED Final   Parainfluenza Virus 1 NOT DETECTED NOT DETECTED Final   Parainfluenza Virus 2 NOT DETECTED NOT DETECTED Final   Parainfluenza Virus 3 NOT DETECTED NOT DETECTED Final   Parainfluenza Virus 4 NOT DETECTED NOT DETECTED Final   Respiratory Syncytial Virus NOT DETECTED NOT DETECTED Final   Bordetella pertussis NOT DETECTED NOT DETECTED Final   Bordetella Parapertussis NOT DETECTED NOT DETECTED Final   Chlamydophila pneumoniae NOT DETECTED NOT DETECTED Final   Mycoplasma pneumoniae NOT DETECTED NOT DETECTED Final    Comment: Performed at [facility], [place], [place], [place] [address]      Radiology Studies: No results found.    LOS: 4 days    Cordelia Poche, MD [facility] 12/21/2021, 8:22 AM   If 7PM-7AM, please contact night-coverage [email or web address]

## 2021-12-21 NOTE — Care Management Important Message (Signed)
Important Message  Patient Details  Name: [name] MRN: [id number] Date of Birth: 1950-08-01   Medicare Important Message Given:  Yes     Shelda Altes 12/21/2021, 8:38 AM

## 2021-12-21 NOTE — TOC Progression Note (Signed)
Transition of Care [facility]) - Progression Note    Patient Details  Name: [name] MRN: [id number] Date of Birth: 1950-08-06  [facility]) CM/SW [place], [place] Phone Number: 12/21/2021, 8:53 AM  Clinical Narrative:     Received message from patient's nephew [name]- preferred SNF are [facility]   CSW sent message to [facility] to confirm bed offer- awaiting response  CSW started insurance authorization for SNF and PTAR.   TOC will continue to follow and assist with discharge planning.  Thurmond Butts, MSW, LCSW Clinical Social Worker    Expected Discharge Plan: Skilled Nursing Facility Barriers to Discharge: Continued Medical Work up, Ship broker, SNF Pending bed offer  Expected Discharge Plan and Services Expected Discharge Plan: [place] arrangements for the past 2 months: Apartment                                       Social Determinants of Health (SDOH) Interventions    Readmission Risk Interventions     No data to display

## 2021-12-21 NOTE — Progress Notes (Addendum)
Nurse notified of pt change in BP. Nurse entered room to find pt VS as below, pt pale and lethargic. NT reported pt had been to the bathroom trying to have a BM. MD notified. Pt receiving 250 bolus. MEWS activated. Nephew at bedside.  Raelyn Number, RN  1145 am BP 76/55 @ bolus completion. MD aware.  12/21/21 1113  Assess: MEWS Score  BP (!) 78/58  MAP (mmHg) 66  Pulse Rate 70  ECG Heart Rate 66  Resp (!) 24  Level of Consciousness Alert  SpO2 95 %  Assess: MEWS Score  MEWS Temp 0  MEWS Systolic 2  MEWS Pulse 0  MEWS RR 1  MEWS LOC 0  MEWS Score 3  MEWS Score Color Yellow  Assess: if the MEWS score is Yellow or Red  Were vital signs taken at a resting state? Yes  Focused Assessment Change from prior assessment (see assessment flowsheet)  Does the patient meet 2 or more of the SIRS criteria? Yes  Does the patient have a confirmed or suspected source of infection? No  MEWS guidelines implemented *See Row Information* Yes  Notify: Charge Nurse/RN  Name of Charge Nurse/RN Notified Ronalee Belts RN  Date Charge Nurse/RN Notified 12/21/21  Time Charge Nurse/RN Notified 1113  Notify: Provider  Provider Name/Title Cordelia Poche t  Date Provider Notified 01/17/22  Time Provider Notified [id number]  Method of Notification Page  Notification Reason Change in status  Provider response No new orders  Assess: SIRS CRITERIA  SIRS Temperature  0  SIRS Pulse 0  SIRS Respirations  1  SIRS WBC 1  SIRS Score Sum  2

## 2021-12-21 NOTE — Progress Notes (Signed)
Following bolus completion, pt VS as below. HR irregular ranging 40s-50s. MD and RR RN informed. New orders received.  Raelyn Number, RN    12/21/21 1145  Vitals  BP (!) 76/55  MAP (mmHg) (!) 62  ECG Heart Rate (!) 57  Resp 17  Level of Consciousness  Level of Consciousness Alert  MEWS COLOR  MEWS Score Color Yellow  MEWS Score  MEWS Temp 0  MEWS Systolic 2  MEWS Pulse 0  MEWS RR 0  MEWS LOC 0  MEWS Score 2

## 2021-12-21 NOTE — Progress Notes (Signed)
ANTICOAGULATION CONSULT NOTE - Follow-up  Pharmacy Consult for Warfarin/Lovenox Indication:  afib, CAD  Allergies  Allergen Reactions   Statins Other (See Comments)    "Muscles were messed up"    Penicillins Other (See Comments)    Childhood reaction. Unknown per Pt, cannot remember ever trying beta lactams     Patient Measurements: Height: '5\' 10"'$  (177.8 cm) Weight: 115.9 kg (255 lb 8 oz) IBW/kg (Calculated) : 73  Vital Signs: Temp: 98 F (36.7 C) (10/17 0733) Temp Source: Oral (10/17 0733) BP: 150/89 (10/17 0733) Pulse Rate: 70 (10/17 0733)  Labs: Recent Labs    12/19/21 0126 12/19/21 1451 12/20/21 0108 12/21/21 0100  HGB  --   --  12.9*  --   HCT  --   --  37.0*  --   PLT  --   --  241  --   LABPROT 15.2  --  16.3* 17.9*  INR 1.2  --  1.3* 1.5*  HEPRLOWMOCWT  --  1.15  --   --   CREATININE  --   --  1.03  --      Estimated Creatinine Clearance: 83.9 mL/min (by C-G formula based on SCr of 1.03 mg/dL).   Medical History: Past Medical History:  Diagnosis Date   Adenomatous colon polyp 07/2003   Heart valve problem    Dr [name]   Hypertension    Mitral valve disorders(424.0)    Nonrheumatic aortic valve stenosis 01/14/2021   Echo 9/22: EF 60-65, small pericardial effusion, mild AS (mean gradient 16 mmHg), trivial AI, mild MR (MR is eccentric - may be underestimated) // Echo 10/2021: EF 60-65, mod calcification of AV, trivial AI, mod-severe AS (DI 0.25, V-max 350 cm/s, mean 28.8), no RWMA, NL RVSF, severe LAE, mild RAE, small effusion w/o tamponade, ?mild-moderate MR (difficult due to eccentric jet)   Nonrheumatic mitral valve regurgitation 01/14/2021   Echo 10/2021: EF 60-65, moderate calcification of the aortic valve, trivial AI, moderate to severe aortic stenosis (DI 0.25, V-max 350 cm/s, mean gradient 28.8), no RWMA, normal RVSF, severe LAE, mild RAE, small pericardial effusion without tamponade, suspect mild-moderate MR (difficult to qualify degree due to  eccentric jet with coanda affect)   Obesity    OSA (obstructive sleep apnea)    cpap   Other and unspecified hyperlipidemia    Permanent atrial fibrillation ([place])    [facility] Monitor 5/22: AFib - Avg HR 55; slowest 32; longest pause 3.9 sec; occ PVCs, 4 beat NSVT   Sleep apnea     Medications:   Assessment: 71 y.o. M presents with CP. S/p cath 10/13 which showed multivessel CAD. Plan for aggressive medical management instead of PCI. Pt on warfarin PTA for afib. Warfarin held for cath since 10/10 and reversed with vit K 2.'5mg'$  10/12 and '5mg'$  10/13. Warfarin restarted on 10/13. Note, patient also on aspirin, and doxycycline (10/11-16)  CBC stable. INR remains Subtherapeutic at 1.5 today. No bleeding reported.  Spoke to patient about potentially switching to Eliquis and provided copay information on 10/16. Patient wanted to think about it  Home dose: 7.'5mg'$  daily  Goal of Therapy:  INR 2-3, anti-Xa level 0.6-1 (4 hrs post Lovenox) Monitor platelets by anticoagulation protocol: Yes   Plan:  Continue lovenox 100 mg q12 Repeat warfarin 10 mg po x 1 Monitor daily INR, CBC, clinical course, s/sx of bleed, PO intake/diet, Drug-Drug Interactions F/u patient preference for warfarin vs. Eliquis   Thank you for allowing [place] to participate in this  patients care.  Erin Hearing PharmD., BCPS Clinical Pharmacist 12/21/2021 9:45 AM

## 2021-12-21 NOTE — TOC Progression Note (Signed)
Transition of Care [facility]) - Progression Note    Patient Details  Name: [name] MRN: [id number] Date of Birth: 25-Mar-1950  Transition of Care [facility]) CM/SW [place], [place] Phone Number: 12/21/2021, 12:02 PM  Clinical Narrative:     8:44am-Started insurance auth for SNF and PTAR with [facility]. - insurance auth pending  12:00 pm CSW spoke with patient's nephew, Shanon Brow- he informed SNF of choice is [facility].   [facility] confirmed bed offer.    TOC will continue to follow and assist with discharge planning.   Thurmond Butts, MSW, LCSW Clinical Social Worker    Expected Discharge Plan: Skilled Nursing Facility Barriers to Discharge: Continued Medical Work up, Ship broker, SNF Pending bed offer  Expected Discharge Plan and Services Expected Discharge Plan: [place] arrangements for the past 2 months: Apartment                                       Social Determinants of Health (SDOH) Interventions    Readmission Risk Interventions     No data to display

## 2021-12-22 ENCOUNTER — Telehealth: Payer: Self-pay | Admitting: Pharmacist

## 2021-12-22 ENCOUNTER — Telehealth (HOSPITAL_COMMUNITY): Payer: Self-pay

## 2021-12-22 ENCOUNTER — Other Ambulatory Visit (HOSPITAL_COMMUNITY): Payer: Self-pay

## 2021-12-22 ENCOUNTER — Telehealth: Payer: Self-pay

## 2021-12-22 DIAGNOSIS — R1311 Dysphagia, oral phase: Secondary | ICD-10-CM | POA: Diagnosis not present

## 2021-12-22 DIAGNOSIS — I4821 Permanent atrial fibrillation: Secondary | ICD-10-CM | POA: Diagnosis not present

## 2021-12-22 DIAGNOSIS — I350 Nonrheumatic aortic (valve) stenosis: Secondary | ICD-10-CM | POA: Diagnosis not present

## 2021-12-22 DIAGNOSIS — R7989 Other specified abnormal findings of blood chemistry: Secondary | ICD-10-CM | POA: Diagnosis not present

## 2021-12-22 DIAGNOSIS — G4733 Obstructive sleep apnea (adult) (pediatric): Secondary | ICD-10-CM | POA: Diagnosis not present

## 2021-12-22 DIAGNOSIS — L03311 Cellulitis of abdominal wall: Secondary | ICD-10-CM | POA: Diagnosis not present

## 2021-12-22 DIAGNOSIS — M6281 Muscle weakness (generalized): Secondary | ICD-10-CM | POA: Diagnosis not present

## 2021-12-22 DIAGNOSIS — E785 Hyperlipidemia, unspecified: Secondary | ICD-10-CM | POA: Diagnosis not present

## 2021-12-22 DIAGNOSIS — R41841 Cognitive communication deficit: Secondary | ICD-10-CM | POA: Diagnosis not present

## 2021-12-22 DIAGNOSIS — I340 Nonrheumatic mitral (valve) insufficiency: Secondary | ICD-10-CM | POA: Diagnosis not present

## 2021-12-22 DIAGNOSIS — I214 Non-ST elevation (NSTEMI) myocardial infarction: Secondary | ICD-10-CM | POA: Diagnosis not present

## 2021-12-22 DIAGNOSIS — I4891 Unspecified atrial fibrillation: Secondary | ICD-10-CM | POA: Diagnosis not present

## 2021-12-22 DIAGNOSIS — I2510 Atherosclerotic heart disease of native coronary artery without angina pectoris: Secondary | ICD-10-CM | POA: Diagnosis not present

## 2021-12-22 DIAGNOSIS — R2681 Unsteadiness on feet: Secondary | ICD-10-CM | POA: Diagnosis not present

## 2021-12-22 LAB — PROTIME-INR
INR: 1.7 — ABNORMAL HIGH (ref 0.8–1.2)
Prothrombin Time: 20 seconds — ABNORMAL HIGH (ref 11.4–15.2)

## 2021-12-22 MED ORDER — WARFARIN SODIUM 10 MG PO TABS: 10.0000 mg | ORAL_TABLET | Freq: Once | ORAL | Status: DC

## 2021-12-22 MED ORDER — ASPIRIN 81 MG PO TBEC: 81.0000 mg | DELAYED_RELEASE_TABLET | Freq: Every day | ORAL | 0 refills | Status: AC

## 2021-12-22 MED ORDER — CARVEDILOL 12.5 MG PO TABS: 12.5000 mg | ORAL_TABLET | Freq: Two times a day (BID) | ORAL | 0 refills | Status: DC

## 2021-12-22 MED ORDER — ENOXAPARIN SODIUM 100 MG/ML IJ SOSY: 100.0000 mg | PREFILLED_SYRINGE | Freq: Two times a day (BID) | INTRAMUSCULAR | 0 refills | Status: DC

## 2021-12-22 MED ORDER — WARFARIN SODIUM 10 MG PO TABS
10.0000 mg | ORAL_TABLET | Freq: Once | ORAL | Status: AC
  Administered 2021-12-22: 10 mg via ORAL
  Filled 2021-12-22: qty 1

## 2021-12-22 NOTE — TOC Benefit Eligibility Note (Addendum)
Patient Teacher, English as a foreign language completed.    The patient is currently admitted and upon discharge could be taking Lovenox '100mg'$ /ml.  The current 5 day co-pay is $100.00.   The patient is insured through [employer] Medicare Part D.     Joneen Boers, [place] Patient Advocate Specialist [place] Patient Advocate Team Direct Number: [phone] Fax: [phone]

## 2021-12-22 NOTE — Telephone Encounter (Signed)
Received message below:  Bhagat, Bhavinkumar, PA  P Cv Div Nl Anticoag This is a Coumadin established patient.  He is going to SNF.  He needs INR check on Friday.  Most likely this would be done at facility.  I just want to update the team.    [facility] SNF and spoke with [place]. Confirmed facility will be managing INR/Warfarin.

## 2021-12-22 NOTE — Progress Notes (Addendum)
Rounding Note    Patient Name: [name] Date of Encounter: 12/22/2021  [place] Cardiologist: [name], MD   Subjective   No chest pain or dyspnea.  Hypotension and bradycardiac episode yesterday.   Inpatient Medications    Scheduled Meds:  aspirin EC  81 mg Oral Daily   carvedilol  12.5 mg Oral BID WC   cloNIDine  0.2 mg Oral BID   doxazosin  4 mg Oral Daily   enoxaparin (LOVENOX) injection  100 mg Subcutaneous Q12H   ezetimibe  10 mg Oral Daily   folic acid  1 mg Oral Daily   hydrochlorothiazide  12.5 mg Oral Daily   influenza vaccine adjuvanted  0.5 mL Intramuscular Tomorrow-1000   losartan  100 mg Oral Daily   lubiprostone  24 mcg Oral BID WC   polyethylene glycol  17 g Oral BID   senna-docusate  1 tablet Oral BID   sodium chloride flush  3 mL Intravenous Q12H   sodium chloride flush  3 mL Intravenous Q12H   warfarin  10 mg Oral ONCE-1600   Warfarin - Pharmacist Dosing Inpatient   Does not apply q1600   Continuous Infusions:  sodium chloride     PRN Meds: sodium chloride, acetaminophen, liver oil-zinc oxide, ondansetron (ZOFRAN) IV, mouth rinse, sodium chloride flush   Vital Signs    Vitals:   12/21/21 1915 12/21/21 2330 12/22/21 0325 12/22/21 0741  BP: 124/86 (!) 156/95 (!) 156/99 (!) 169/102  Pulse:  77 81 87  Resp: '13 15 16 16  '$ Temp: 98.9 F (37.2 C) 98.9 F (37.2 C) 98.4 F (36.9 C) 97.8 F (36.6 C)  TempSrc: Oral Oral Oral Oral  SpO2: 98% 98% 96% 97%  Weight:      Height:        Intake/Output Summary (Last 24 hours) at 12/22/2021 1225 Last data filed at 12/22/2021 0744 Gross per 24 hour  Intake 240 ml  Output 1050 ml  Net -810 ml      12/17/2021    3:20 AM 12/15/2021   11:02 PM 10/27/2021    9:39 AM  Last 3 Weights  Weight (lbs) 255 lb 8 oz 260 lb 6.4 oz 263 lb  Weight (kg) 115.894 kg 118.117 kg 119.296 kg      Telemetry    Afib with Hr in 60s, intermittent pause (4.23 sec last night) - Personally  Reviewed  ECG    N/A  Physical Exam   GEN: No acute distress.   Neck: No JVD Cardiac: IR IR, no murmurs, rubs, or gallops.  Respiratory: Clear to auscultation bilaterally. GI: Soft, nontender, non-distended  MS: No edema; No deformity. Neuro:  Nonfocal  Psych: Normal affect   Labs    High Sensitivity Troponin:   Recent Labs  Lab 12/14/21 2140 12/14/21 2343 12/15/21 0420 12/15/21 1330 12/16/21 0104  TROPONINIHS 21* 51* 196* 334* 204*     Chemistry Recent Labs  Lab 12/16/21 0104 12/17/21 1635 12/17/21 1643 12/17/21 1646 12/20/21 0108 12/21/21 0100  NA 137   < > 139 138 135  --   K 3.6   < > 3.7 3.8 3.3* 4.0  CL 104  --   --   --  102  --   CO2 24  --   --   --  25  --   GLUCOSE 109*  --   --   --  145*  --   BUN 18  --   --   --  23  --   CREATININE 0.96  --   --   --  1.03  --   CALCIUM 10.6*  --   --   --  10.7*  --   GFRNONAA >60  --   --   --  >60  --   ANIONGAP 9  --   --   --  8  --    < > = values in this interval not displayed.    Lipids No results for input(s): "CHOL", "TRIG", "HDL", "LABVLDL", "LDLCALC", "CHOLHDL" in the last 168 hours.  Hematology Recent Labs  Lab 12/17/21 0218 12/17/21 1635 12/17/21 1646 12/18/21 0102 12/20/21 0108  WBC 8.5  --   --  6.3 7.1  RBC 4.01*  --   --  4.01* 4.19*  HGB 12.3*   < > 12.2* 12.4* 12.9*  HCT 36.7*   < > 36.0* 36.5* 37.0*  MCV 91.5  --   --  91.0 88.3  MCH 30.7  --   --  30.9 30.8  MCHC 33.5  --   --  34.0 34.9  RDW 14.3  --   --  13.8 13.5  PLT 187  --   --  204 241   < > = values in this interval not displayed.    Radiology    No results found.  Cardiac Studies   Echo 12/15/2021 1. Left ventricular ejection fraction, by estimation, is 55 to 60%. The  left ventricle has normal function. The left ventricle has no regional  wall motion abnormalities. Left ventricular diastolic parameters are  indeterminate.   2. Right ventricular systolic function is normal. The right ventricular  size is  normal. Tricuspid regurgitation signal is inadequate for assessing  PA pressure.   3. Left atrial size was moderately dilated.   4. The mitral valve was not well visualized. Trivial mitral valve  regurgitation (MR was reported as worse on last echo but mitral valve  poorly visualized on this echo). No evidence of mitral stenosis.   5. The aortic valve is tricuspid. There is moderate calcification of the  aortic valve. Aortic valve regurgitation is mild. Moderate aortic valve  stenosis. Aortic valve mean gradient measures 25.0 mmHg. Calculated AVA  1.5 cm^2.   6. Aortic dilatation noted. There is mild dilatation of the aortic root  and of the ascending aorta, measuring 40 mm.   7. The inferior vena cava is dilated in size with >50% respiratory  variability, suggesting right atrial pressure of 8 mmHg.   8. The patient was in atrial fibrillation.    RIGHT/LEFT HEART CATH AND CORONARY ANGIOGRAPHY  12/18/2021    Conclusion       Prox LAD to Dist LAD lesion is 55% stenosed.   Mid RCA to Dist RCA lesion is 100% stenosed with 100% stenosed side branch in Acute Mrg.   1st Mrg lesion is 99% stenosed with 70% stenosed side branch in Lat 1st Mrg.   Dist Cx lesion is 70% stenosed with 100% stenosed side branch in 2nd Mrg.   LV end diastolic pressure is moderately elevated.   Hemodynamic findings consistent with mild pulmonary hypertension.   There is mild aortic valve stenosis.   POST-OPERATIVE DIAGNOSIS:  Relatively Normal Right Heart Cath Numbers: RAP mean 9 mmHg, RVP-EDP 37/4-9 mmHg PAP-mean 35/18-26 mmHg; PCWP 19/26 mmHg - 22 mmHg LVEDP-EDP 165/8-18 mmHg; AOP-MAP 150/81-107 mmHg Ao sat 96%, PA sat 65%. ` Cardiac Output 5.94, Cardiac Index 2.56 (mildly reduced Severe multivessel CAD:  Small nondominant RCA (<1.5 mm) -100% CTO of the mid vessel Diffusely diseased 40 to 60% LAD from proximal to apical with no obvious focal lesion Large-caliber mostly dominant LCx with 2 major OM branches, 2  PL's and a PDA OM1 has segmental irregular hazy ulcerated appearing likely calcified lesion just proximal to the bifurcation into 2 subbranches (not favorable for PCI, but could be attempted if necessary) OM 2 is CTO flush occlusion (cannot tell if it actually comes off of OM1 or comes off of the LCx as it fills via retrograde filling but not all the way to the parent vessel The AV groove circumflex after occluded OM2 has an eccentric appearing 70% stenosis then normalizes prior to the final 3 branches (LPL 1 LPL 2 and PDA   PLAN OF CARE:  Return to nursing unit for ongoing care.  Okay to restart heparin or Lovenox in the morning 12/18/2021; can dose warfarin tonight Aggressive guideline directed medical therapy for A-fib, CAD Monitor for signs of concerning symptoms for angina or worsening CHF, would otherwise treat the OM and LCx lesions medically for now. Cath films were reviewed with Dr. [place] to make decision of attempted PCI versus medical therapy.  His recommendation independent mine was also to recommend medical management based on the the difficulty of intervention of the OM branch which is probably the culprit based on the downstream bifurcation.  If he were to have significant symptoms, this could potentially be attempted but will be difficult.  The LCx does have a lesion which may or may not be flow-limiting.  It could also be attempted, if symptoms warranted.  Diagnostic Dominance: Right      Patient Profile     71 y.o. male with a hx of permanent afib, bradycardia with pause (reduced BB), MR, aortic stenosis, hypertension, hyperlipidemia, OSA, obesity who presented with complaints of generalized weakness and exertional dyspnea. Cardiology is consulted for elevated troponin.   Assessment & Plan    Elevated troponin / CAD - High-sensitivity troponin 51>> 196>> 334>>204  CT chest abdomen pelvis with extensive coronary calcifications reported.  - Cath showed multivessel CAD as noted  above. "Recommend medical management based on the the difficulty of intervention of the OM branch which is probably the culprit based on the downstream bifurcation.  If he were to have significant symptoms, this could potentially be attempted but will be difficult.  The LCx does have a lesion which may or may not be flow-limiting.  It could also be attempted, if symptoms warranted". - Continue ASA, BB and zetia   2. HLD - 07/15/2021: Cholesterol 147; HDL 34.10; LDL Cholesterol 92; Triglycerides 103.0; VLDL 20.6  - Statin intolerance - Continue Zetia  - Lipoprotein (a) 123 - Pending lipid clinic evaluation    3. Permanent afib with slow rate  - Rate stable on Coreg '25mg'$  BID with intermittent pause but > 3 sec. However did have prior hx requiring reducing dose.  -Yesterday had an episode of hypotension and bradycardic in 40s.  His evening dose of carvedilol held.  Now started at 12.5 mg twice daily this morning.  Had 4.23-second pause overnight. -Heart rate stable in 60s -Patient with history of untreated sleep apnea.  Was on CPAP at 1 point.  He is not interested in another sleep study or CPAP.  - He did received vit K. On warfarin and lovenox.  He is not interested in Eliquis or Xarelto.  INR 1.7.  Goal between 2-3.  Does not need bridging with  lovenox, OK to discharge with f/u.   4. HTN -Hypotensive episode yesterday requiring 2 hold amlodipine carvedilol. -Fluctuating blood pressure.  Most recent 118/69 -Continue carvedilol 12.5 mg twice daily (will not titrate further) -Continue clonidine 0.2 mg twice a day -Continue losartan 100 mg daily -Continue hydrochlorothiazide 12.5 mg daily   5. Pauses: up to 3 second pauses overnight, likely due to OSA    For questions or updates, please contact [place] Please consult [email or web address] for contact info under        SignedLeanor Kail, PA  12/22/2021, 12:25 PM     Patient seen and examined.  Agree with above  documentation.  On exam, patient is alert and oriented, normal rate, irregular rhythm, no murmurs, lungs CTAB, no LE edema or JVD.  Telemetry shows Afib with rate 60-70s.  Coreg was reduced to 12.5 mg BID due to low HR/soft BP, and amlodipine was discontinued.  Planning discharge today, will schedule cardiology f/u and f/u with coumadin clinic.  Donato Heinz, MD

## 2021-12-22 NOTE — Telephone Encounter (Signed)
Pharmacy Patient Advocate Encounter  Insurance verification completed.    The patient is insured through [employer] Medicare Part D   The patient is currently admitted and ran test claims for the following: Lovenox '100mg'$ /ml.  Copays and coinsurance results were relayed to Inpatient clinical team.

## 2021-12-22 NOTE — Discharge Summary (Signed)
Physician Discharge Summary   Patient: [name] MRN: [id number] DOB: 02/09/1951  Admit date:     12/14/2021  Discharge date: 12/22/21  Discharge Physician: Marylu Lund   PCP: [name], MD   Recommendations at discharge:    Follow up with PCP in 1-2 weeks Continue lovenox bridge until INR between 2-3. INR on 10/18 of 1.7, consider repeat INR in 24-48hrs  Discharge Diagnoses: Principal Problem:   Elevated troponin Active Problems:   OBSTRUCTIVE SLEEP APNEA   Resistant hypertension   Permanent atrial fibrillation (HCC)   Nonrheumatic aortic valve stenosis   Cellulitis of abdominal wall   Generalized weakness   Non-ST elevation (NSTEMI) myocardial infarction [facility])  Resolved Problems:   * No resolved hospital problems. *  Hospital Course: [name] is a 71 y.o. male wit a history of hypertension, aortic stenosis and atrial fibrillation on Coumadin who presented secondary weakness and dyspnea on exertion. Associated elevated troponin. Cardiology consulted. L/RHC performed on 10/13 which was significant for severe disease and recommendation for medical management.  Assessment and Plan: Generalized weakness Dyspnea on exertion Nonspecific. Possibly related to bradycardia vs ACS vs infection vs aortic stenosis. Troponin peaked to 334 this visit. No symptoms at rest. Heart rate as low as 40s while on Coreg. EKG without strong evidence for ACS. Cardiology consulted and recommended L/RHC, which was performed on 10/13 and significant for severe multivessel CAD. Cardiology recommending medical management rather than PCI secondary to difficulty of intervention of the OM branch.   Severe CAD As mentioned above. -Cardiology recommendations: aspirin   Elevated troponin Hs Troponin trend of 21 > 51 > 196 > 334 > 204. No chest pain since admission but continued to have exertional dyspnea. As mentioned above.   Abdominal wall dermatitis Cellulitis Distribution seems more  consistent with intertrigo than with cellulitis; CT abdomen/pelvis suggests possible cellulitis. Associated leukocytosis. No fever, tenderness or warmth of the area. Dermatitis is confined to skin folds of lower panus and inguinal areas. Patient started on and completed doxycycline -Keep area clean and dry -Barrier cream   Hypotension Appears to be secondary to likely vasovagal episode. Systolic in 50D-32I with HR of 67 and no symptoms per patient. Patient has also had antihypertensives increased recently which may be contributing. -did receive gentle IVF bolus -coreg dose was decreased to 12.'5mg'$   -BP and HR stabilized   Vitamin B12 deficiency Folate deficiency Vitamin B12 level of 74 and folate of 5. -Vitamin Z12 and folic acid supplementation   Aortic stenosis Transthoracic Echocardiogram significant for moderate stenosis with mild regurgitation.   Permanent atrial fibrillation Coumadin held for heart catheterization and has been resumed. Coreg increased to 25 mg BID by cardiology. -Continue Coreg -Per cardiology, Coumadin with Lovenox   Primary hypertension -Continue Coreg, losartan, hydrochlorothiazide -Hold PM clonidine dose this evening and hold amlodipine secondary to hypotension   Constipation Chronic issue. Patient had concerns about constipation but declines enema and suppository. -Continue Amitiza -Continue MiraLAX   OSA -CPAP qHS        Consultants: Cardiology Procedures performed:  heart cath Disposition: Skilled nursing facility Diet recommendation:  Cardiac diet DISCHARGE MEDICATION: Allergies as of 12/22/2021       Reactions   Statins Other (See Comments)   "Muscles were messed up"    Penicillins Other (See Comments)   Childhood reaction. Unknown per Pt, cannot remember ever trying beta lactams         Medication List     STOP taking these medications  amLODipine 10 MG tablet Commonly known as: NORVASC   Iron (Ferrous Sulfate) 325 (65  Fe) MG Tabs   ketoconazole 2 % cream Commonly known as: NIZORAL   meloxicam 15 MG tablet Commonly known as: MOBIC       TAKE these medications    acetaminophen 325 MG tablet Commonly known as: TYLENOL Take 650 mg by mouth daily as needed (pain).   aspirin EC 81 MG tablet Take 1 tablet (81 mg total) by mouth daily. Swallow whole. Start taking on: December 23, 2021   carvedilol 12.5 MG tablet Commonly known as: COREG Take 1 tablet (12.5 mg total) by mouth 2 (two) times daily with a meal. What changed: See the new instructions.   cloNIDine 0.2 MG tablet Commonly known as: CATAPRES Take 1 tablet (0.2 mg total) by mouth 2 (two) times daily.   doxazosin 4 MG tablet Commonly known as: CARDURA Take 1 tablet (4 mg total) by mouth daily.   enoxaparin 100 MG/ML injection Commonly known as: LOVENOX Inject 1 mL (100 mg total) into the skin every 12 (twelve) hours for 3 days.   ezetimibe 10 MG tablet Commonly known as: ZETIA Take 1 tablet (10 mg total) by mouth daily.   hydrochlorothiazide 12.5 MG capsule Commonly known as: MICROZIDE Take 1 capsule (12.5 mg total) by mouth daily. What changed: when to take this   losartan 100 MG tablet Commonly known as: COZAAR Take 1 tablet (100 mg total) by mouth daily.   lubiprostone 24 MCG capsule Commonly known as: AMITIZA Take 1 capsule (24 mcg total) by mouth 2 (two) times daily.   OMEGA-3 FATTY ACIDS PO Take 1 capsule by mouth in the morning and at bedtime.   warfarin 5 MG tablet Commonly known as: COUMADIN Take as directed. If you are unsure how to take this medication, talk to your nurse or doctor. Original instructions: TAKE 1 TO 1 AND 1/2 TABLETS BY MOUTH DAILY AS DIRECTED BY COUMADIN CLINIC What changed: See the new instructions.        Follow-up Information     Fay Records, MD Follow up on 02/07/2022.   Specialty: Cardiology Why: '@9am'$  Contact information: [place]  [id number] [phone]         [name], MD Follow up in 2 week(s).   Specialty: Family Medicine Why: Hospital follow up Contact information: [place] [address] [place] [phone]                Discharge Exam: Danley Danker Weights   12/15/21 2302 12/17/21 0320  Weight: 118.1 kg 115.9 kg   General exam: Awake, laying in bed, in nad Respiratory system: Normal respiratory effort, no wheezing Cardiovascular system: regular rate, s1, s2 Gastrointestinal system: Soft, nondistended, positive BS Central nervous system: CN2-12 grossly intact, strength intact Extremities: Perfused, no clubbing Skin: Normal skin turgor, no notable skin lesions seen Psychiatry: Mood normal // no visual hallucinations   Condition at discharge: fair  The results of significant diagnostics from this hospitalization (including imaging, microbiology, ancillary and laboratory) are listed below for reference.   Imaging Studies: CARDIAC CATHETERIZATION  Result Date: 12/18/2021   Prox LAD to Dist LAD lesion is 55% stenosed.   Mid RCA to Dist RCA lesion is 100% stenosed with 100% stenosed side branch in Acute Mrg.   1st Mrg lesion is 99% stenosed with 70% stenosed side branch in Lat 1st Mrg.   Dist Cx lesion is 70% stenosed with 100%  stenosed side branch in 2nd Mrg.   LV end diastolic pressure is moderately elevated.   Hemodynamic findings consistent with mild pulmonary hypertension.   There is mild aortic valve stenosis. POST-OPERATIVE DIAGNOSIS: Relatively Normal Right Heart Cath Numbers: RAP mean 9 mmHg, RVP-EDP 37/4-9 mmHg PAP-mean 35/18-26 mmHg; PCWP 19/26 mmHg - 22 mmHg LVEDP-EDP 165/8-18 mmHg; AOP-MAP 150/81-107 mmHg Ao sat 96%, PA sat 65%. ` Cardiac Output 5.94, Cardiac Index 2.56 (mildly reduced Severe multivessel CAD: Small nondominant RCA (<1.5 mm) -100% CTO of the mid vessel Diffusely diseased 40 to 60% LAD from proximal to apical with no obvious focal lesion Large-caliber mostly  dominant LCx with 2 major OM branches, 2 PL's and a PDA OM1 has segmental irregular hazy ulcerated appearing likely calcified lesion just proximal to the bifurcation into 2 subbranches (not favorable for PCI, but could be attempted if necessary) OM 2 is CTO flush occlusion (cannot tell if it actually comes off of OM1 or comes off of the LCx as it fills via retrograde filling but not all the way to the parent vessel The AV groove circumflex after occluded OM2 has an eccentric appearing 70% stenosis then normalizes prior to the final 3 branches (LPL 1 LPL 2 and PDA PLAN OF CARE:  Return to nursing unit for ongoing care.  Okay to restart heparin or Lovenox in the morning 12/18/2021; can dose warfarin tonight Aggressive guideline directed medical therapy for A-fib, CAD Monitor for signs of concerning symptoms for angina or worsening CHF, would otherwise treat the OM and LCx lesions medically for now. Cath films were reviewed with Dr. Martinique to make decision of attempted PCI versus medical therapy.  His recommendation independent mine was also to recommend medical management based on the the difficulty of intervention of the OM branch which is probably the culprit based on the downstream bifurcation.  If he were to have significant symptoms, this could potentially be attempted but will be difficult.  The LCx does have a lesion which may or may not be flow-limiting.  It could also be attempted, if symptoms warranted.  Glenetta Hew, MD   ECHOCARDIOGRAM COMPLETE  Result Date: 12/15/2021    ECHOCARDIOGRAM REPORT   Patient Name:   KELLIS MCADAM Date of Exam: 12/15/2021 Medical Rec #:  [id number]    Height:       70.0 in Accession #:    [id number]   Weight:       263.0 lb Date of Birth:  [birth year]    BSA:          2.345 m Patient Age:    49 years     BP:           156/73 mmHg Patient Gender: M            HR:           74 bpm. Exam Location:  Inpatient Procedure: 2D Echo, Cardiac Doppler, Color Doppler and Intracardiac             Opacification Agent Indications:    Chest pain, elevated troponin, aortic stenosis  History:        Patient has prior history of Echocardiogram examinations. Aortic                 Valve Disease and Mitral Valve Disease, Arrythmias:Atrial                 Fibrillation and Bradycardia; Risk Factors:Family History of  Coronary Artery Disease, Hypertension, Dyslipidemia, Sleep Apnea                 and Obesity.  Sonographer:    Eartha Inch Referring Phys: [id number] TIMOTHY S OPYD  Sonographer Comments: Technically difficult study due to poor echo windows. Image acquisition challenging due to patient body habitus and Image acquisition challenging due to respiratory motion. IMPRESSIONS  1. Left ventricular ejection fraction, by estimation, is 55 to 60%. The left ventricle has normal function. The left ventricle has no regional wall motion abnormalities. Left ventricular diastolic parameters are indeterminate.  2. Right ventricular systolic function is normal. The right ventricular size is normal. Tricuspid regurgitation signal is inadequate for assessing PA pressure.  3. Left atrial size was moderately dilated.  4. The mitral valve was not well visualized. Trivial mitral valve regurgitation (MR was reported as worse on last echo but mitral valve poorly visualized on this echo). No evidence of mitral stenosis.  5. The aortic valve is tricuspid. There is moderate calcification of the aortic valve. Aortic valve regurgitation is mild. Moderate aortic valve stenosis. Aortic valve mean gradient measures 25.0 mmHg. Calculated AVA 1.5 cm^2.  6. Aortic dilatation noted. There is mild dilatation of the aortic root and of the ascending aorta, measuring 40 mm.  7. The inferior vena cava is dilated in size with >50% respiratory variability, suggesting right atrial pressure of 8 mmHg.  8. The patient was in atrial fibrillation. FINDINGS  Left Ventricle: Left ventricular ejection fraction, by estimation, is 55 to  60%. The left ventricle has normal function. The left ventricle has no regional wall motion abnormalities. The left ventricular internal cavity size was normal in size. There is  no left ventricular hypertrophy. Left ventricular diastolic parameters are indeterminate. Right Ventricle: The right ventricular size is normal. No increase in right ventricular wall thickness. Right ventricular systolic function is normal. Tricuspid regurgitation signal is inadequate for assessing PA pressure. Left Atrium: Left atrial size was moderately dilated. Right Atrium: Right atrial size was normal in size. Pericardium: Trivial pericardial effusion is present. Mitral Valve: The mitral valve was not well visualized. Trivial mitral valve regurgitation. No evidence of mitral valve stenosis. MV peak gradient, 5.6 mmHg. The mean mitral valve gradient is 2.5 mmHg. Tricuspid Valve: The tricuspid valve is normal in structure. Tricuspid valve regurgitation is not demonstrated. Aortic Valve: The aortic valve is tricuspid. There is moderate calcification of the aortic valve. Aortic valve regurgitation is mild. Moderate aortic stenosis is present. Aortic valve mean gradient measures 25.0 mmHg. Aortic valve peak gradient measures 36.4 mmHg. Aortic valve area, by VTI measures 1.97 cm. Pulmonic Valve: The pulmonic valve was normal in structure. Pulmonic valve regurgitation is not visualized. Aorta: Aortic dilatation noted. There is mild dilatation of the aortic root and of the ascending aorta, measuring 40 mm. Venous: The inferior vena cava is dilated in size with greater than 50% respiratory variability, suggesting right atrial pressure of 8 mmHg. IAS/Shunts: No atrial level shunt detected by color flow Doppler.  LEFT VENTRICLE PLAX 2D LVIDd:         4.70 cm   Diastology LVIDs:         3.00 cm   LV e' medial:    7.07 cm/s LV PW:         1.00 cm   LV E/e' medial:  18.7 LV IVS:        1.00 cm   LV e' lateral:   10.70 cm/s LVOT diam:  2.30 cm    LV E/e' lateral: 12.3 LV SV:         110 LV SV Index:   47 LVOT Area:     4.15 cm  RIGHT VENTRICLE             IVC RV S prime:     13.40 cm/s  IVC diam: 2.30 cm TAPSE (M-mode): 1.8 cm LEFT ATRIUM              Index        RIGHT ATRIUM           Index LA diam:        5.60 cm  2.39 cm/m   RA Area:     15.60 cm LA Vol (A2C):   102.0 ml 43.50 ml/m  RA Volume:   37.90 ml  16.16 ml/m LA Vol (A4C):   108.0 ml 46.06 ml/m LA Biplane Vol: 107.0 ml 45.63 ml/m  AORTIC VALVE AV Area (Vmax):    1.76 cm AV Area (Vmean):   1.96 cm AV Area (VTI):     1.97 cm AV Vmax:           301.60 cm/s AV Vmean:          194.667 cm/s AV VTI:            0.557 m AV Peak Grad:      36.4 mmHg AV Mean Grad:      25.0 mmHg LVOT Vmax:         128.00 cm/s LVOT Vmean:        91.800 cm/s LVOT VTI:          0.264 m LVOT/AV VTI ratio: 0.47  AORTA Ao Root diam: 3.90 cm Ao Asc diam:  4.00 cm MITRAL VALVE MV Area (PHT): 4.01 cm     SHUNTS MV Area VTI:   3.71 cm     Systemic VTI:  0.26 m MV Peak grad:  5.6 mmHg     Systemic Diam: 2.30 cm MV Mean grad:  2.5 mmHg MV Vmax:       1.18 m/s MV Vmean:      73.3 cm/s MV Decel Time: 189 msec MV E velocity: 132.00 cm/s Dalton McleanMD Electronically signed by Franki Monte Signature Date/Time: 12/15/2021/1:49:55 PM    Final    CT CHEST ABDOMEN PELVIS W CONTRAST  Result Date: 12/15/2021 CLINICAL DATA:  Aortic aneurysm, known or suspected. Generalized weakness. EXAM: CT CHEST, ABDOMEN, AND PELVIS WITH CONTRAST TECHNIQUE: Multidetector CT imaging of the chest, abdomen and pelvis was performed following the standard protocol during bolus administration of intravenous contrast. RADIATION DOSE REDUCTION: This exam was performed according to the departmental dose-optimization program which includes automated exposure control, adjustment of the mA and/or kV according to patient size and/or use of iterative reconstruction technique. CONTRAST:  48m OMNIPAQUE IOHEXOL 350 MG/ML SOLN COMPARISON:  None Available.  FINDINGS: CT CHEST FINDINGS Cardiovascular: Mild global cardiomegaly. Extensive multi-vessel coronary artery calcification. Aortic valvular calcifications noted. Small pericardial effusion without definite CT evidence of cardiac tamponade. The central pulmonary arteries are enlarged in keeping with changes of pulmonary arterial hypertension. Mild atherosclerotic calcification within the thoracic aorta. No aortic aneurysm. Mediastinum/Nodes: No enlarged mediastinal, hilar, or axillary lymph nodes. Thyroid gland, trachea, and esophagus demonstrate no significant findings. Lungs/Pleura: Mild bibasilar atelectasis. Lungs are otherwise clear. No pneumothorax or pleural effusion. No central obstructing lesion. Musculoskeletal: Osseous structures are age-appropriate. No acute bone abnormality. No lytic or blastic bone lesion. CT ABDOMEN  PELVIS FINDINGS Hepatobiliary: No focal liver abnormality is seen. No gallstones, gallbladder wall thickening, or biliary dilatation. Pancreas: Unremarkable Spleen: Multiple hypoenhancing lesions are seen within the spleen which are indeterminate on this single phase examination but may represent small cysts or hemangioma in a patient without a history of malignancy. The spleen is not enlarged. Adrenals/Urinary Tract: The adrenal glands are unremarkable. The kidneys are normal in size and position. Bilateral nonobstructing nephrolithiasis is present with scattered calculi bilaterally measuring up to 3 mm. There is an additional nonobstructing 15 mm calculus within the left renal pelvis. No hydronephrosis. No ureteral calculi. No perinephric inflammatory stranding or fluid collections are identified. The bladder is unremarkable. Stomach/Bowel: Moderate colonic stool burden without evidence of obstruction. The stomach, small bowel, and large bowel are otherwise unremarkable. Appendix normal. No free intraperitoneal gas or fluid. Vascular/Lymphatic: Aortic atherosclerosis. No aortic aneurysm  identified. No enlarged abdominal or pelvic lymph nodes. Reproductive: Prostate is unremarkable. Other: Infiltration within the subcutaneous fat of the inferior pannus with associated dermal thickening is partially visualized on this examination. This is nonspecific and may be chronic in nature, however, acute inflammatory conditions such as cellulitis could appear in this fashion. No abdominal wall hernia. Musculoskeletal: Advanced degenerative changes are seen asymmetrically within the right hip right L5 laminotomy has been performed. Advanced degenerative changes are seen throughout the thoracolumbar spine with grade 1 anterolisthesis of L4-L5. No acute bone abnormality. No lytic or blastic bone lesion. IMPRESSION: 1. No acute intrathoracic or intra-abdominal pathology identified. No aortic aneurysm. 2. Extensive multi-vessel coronary artery calcification. 3. Aortic valvular calcifications noted. Echocardiography may be helpful to assess the degree of valvular dysfunction. 4. Mild global cardiomegaly. Small pericardial effusion. 5. Morphologic changes in keeping with pulmonary arterial hypertension. 6. Bilateral nonobstructing nephrolithiasis. No hydronephrosis. 7. Moderate colonic stool burden without evidence of obstruction. 8. Infiltration within the subcutaneous fat of the inferior pannus with associated dermal thickening. This may be chronic in nature, however, acute inflammatory conditions such as cellulitis could appear in this fashion. Correlation with clinical examination is recommended. Aortic Atherosclerosis (ICD10-I70.0). Electronically Signed   By: Fidela Salisbury M.D.   On: 12/15/2021 00:35   DG Chest 1 View  Result Date: 12/14/2021 CLINICAL DATA:  Weakness starting today. EXAM: CHEST  1 VIEW COMPARISON:  None 914 FINDINGS: Shallow inspiration. Cardiac enlargement. No vascular congestion, edema, or consolidation. No pleural effusions. No pneumothorax. Mediastinal contours appear intact.  Calcification of the aorta. Degenerative changes in the spine and shoulders. IMPRESSION: Cardiac enlargement.  No evidence of active pulmonary disease. Electronically Signed   By: Lucienne Capers M.D.   On: 12/14/2021 22:24    Microbiology: Results for orders placed or performed during the hospital encounter of 12/14/21  Urine Culture     Status: None   Collection Time: 12/14/21 10:22 PM   Specimen: Urine, Clean Catch  Result Value Ref Range Status   Specimen Description URINE, CLEAN CATCH  Final   Special Requests NONE  Final   Culture   Final    NO GROWTH Performed at [facility], [place] [address], [place], [place] [address]    Report Status 12/16/2021 FINAL  Final  SARS Coronavirus 2 by RT PCR (hospital order, performed in [facility] hospital lab) *cepheid single result test* Anterior Nasal Swab     Status: None   Collection Time: 12/14/21 11:43 PM   Specimen: Anterior Nasal Swab  Result Value Ref Range Status   SARS Coronavirus 2 by RT PCR NEGATIVE NEGATIVE  Final    Comment: (NOTE) SARS-CoV-2 target nucleic acids are NOT DETECTED.  The SARS-CoV-2 RNA is generally detectable in upper and lower respiratory specimens during the acute phase of infection. The lowest concentration of SARS-CoV-2 viral copies this assay can detect is 250 copies / mL. A negative result does not preclude SARS-CoV-2 infection and should not be used as the sole basis for treatment or other patient management decisions.  A negative result may occur with improper specimen collection / handling, submission of specimen other than nasopharyngeal swab, presence of viral mutation(s) within the areas targeted by this assay, and inadequate number of viral copies (<250 copies / mL). A negative result must be combined with clinical observations, patient history, and epidemiological information.  Fact Sheet for Patients:   https://www.patel.info/  Fact Sheet for Healthcare  Providers: https://Desjardin.com/  This test is not yet approved or  cleared by the [place] FDA and has been authorized for detection and/or diagnosis of SARS-CoV-2 by FDA under an Emergency Use Authorization (EUA).  This EUA will remain in effect (meaning this test can be used) for the duration of the COVID-19 declaration under Section 564(b)(1) of the Act, 21 U.S.C. section 360bbb-3(b)(1), unless the authorization is terminated or revoked sooner.  Performed at [facility], [place] [address] [place], [place] [address]   Respiratory (~20 pathogens) panel by PCR     Status: None   Collection Time: 12/15/21  1:30 PM   Specimen: Nasopharyngeal Swab; Respiratory  Result Value Ref Range Status   Adenovirus NOT DETECTED NOT DETECTED Final   Coronavirus 229E NOT DETECTED NOT DETECTED Final    Comment: (NOTE) The Coronavirus on the Respiratory Panel, DOES NOT test for the novel  Coronavirus (2019 nCoV)    Coronavirus HKU1 NOT DETECTED NOT DETECTED Final   Coronavirus NL63 NOT DETECTED NOT DETECTED Final   Coronavirus OC43 NOT DETECTED NOT DETECTED Final   Metapneumovirus NOT DETECTED NOT DETECTED Final   Rhinovirus / Enterovirus NOT DETECTED NOT DETECTED Final   Influenza A NOT DETECTED NOT DETECTED Final   Influenza B NOT DETECTED NOT DETECTED Final   Parainfluenza Virus 1 NOT DETECTED NOT DETECTED Final   Parainfluenza Virus 2 NOT DETECTED NOT DETECTED Final   Parainfluenza Virus 3 NOT DETECTED NOT DETECTED Final   Parainfluenza Virus 4 NOT DETECTED NOT DETECTED Final   Respiratory Syncytial Virus NOT DETECTED NOT DETECTED Final   Bordetella pertussis NOT DETECTED NOT DETECTED Final   Bordetella Parapertussis NOT DETECTED NOT DETECTED Final   Chlamydophila pneumoniae NOT DETECTED NOT DETECTED Final   Mycoplasma pneumoniae NOT DETECTED NOT DETECTED Final    Comment: Performed at [facility], [place]. [address], [place], [place] [address]     Labs: CBC: Recent Labs  Lab 12/16/21 0104 12/17/21 0218 12/17/21 1635 12/17/21 1643 12/17/21 1646 12/18/21 0102 12/20/21 0108  WBC 11.3* 8.5  --   --   --  6.3 7.1  HGB 11.9* 12.3* 12.2* 11.9* 12.2* 12.4* 12.9*  HCT 33.6* 36.7* 36.0* 35.0* 36.0* 36.5* 37.0*  MCV 89.4 91.5  --   --   --  91.0 88.3  PLT 163 187  --   --   --  204 098   Basic Metabolic Panel: Recent Labs  Lab 12/16/21 0104 12/17/21 1635 12/17/21 1643 12/17/21 1646 12/20/21 0108 12/21/21 0100  NA 137 138 139 138 135  --   K 3.6 3.7 3.7 3.8 3.3* 4.0  CL 104  --   --   --  102  --   CO2 24  --   --   --  25  --   GLUCOSE 109*  --   --   --  145*  --   BUN 18  --   --   --  23  --   CREATININE 0.96  --   --   --  1.03  --   CALCIUM 10.6*  --   --   --  10.7*  --    Liver Function Tests: No results for input(s): "AST", "ALT", "ALKPHOS", "BILITOT", "PROT", "ALBUMIN" in the last 168 hours. CBG: No results for input(s): "GLUCAP" in the last 168 hours.  Discharge time spent: less than 30 minutes.  Signed: Marylu Lund, MD [facility] 12/22/2021

## 2021-12-22 NOTE — TOC Transition Note (Signed)
Transition of Care [facility]) - CM/SW Discharge Note   Patient Details  Name: WIATT MAHABIR MRN: [id number] Date of Birth: 1950/07/02  Transition of Care [facility]) CM/SW Contact:  Vinie Sill, LCSW Phone Number: 12/22/2021, 1:29 PM   Clinical Narrative:     Patient will Discharge to: [facility] Discharge Date: 12/22/2021 Family Notified: nephew, Shanon Brow Transport By: Private car w/family  Per MD patient is ready for discharge. RN, patient, and facility notified of discharge. Discharge Summary sent to facility. RN given number for [phone], room 107.  Clinical Social Worker signing off.  Thurmond Butts, MSW, LCSW Clinical Social Worker     Final next level of care: Skilled Nursing Facility Barriers to Discharge: Barriers Resolved   Patient Goals and CMS Choice Patient states their goals for this hospitalization and ongoing recovery are:: SNF CMS Medicare.gov Compare Post Acute Care list provided to:: Patient Choice offered to / list presented to : Patient  Discharge Placement              Patient chooses bed at: [facility] Patient to be transferred to facility by: Private care Name of family member notified: nephew Patient and family notified of of transfer: 12/22/21  Discharge Plan and Services                                     Social Determinants of Health (SDOH) Interventions     Readmission Risk Interventions     No data to display

## 2021-12-22 NOTE — Progress Notes (Addendum)
ANTICOAGULATION CONSULT NOTE - Follow-up  Pharmacy Consult for Warfarin/Lovenox Indication:  afib, CAD  Allergies  Allergen Reactions   Statins Other (See Comments)    "Muscles were messed up"    Penicillins Other (See Comments)    Childhood reaction. Unknown per Pt, cannot remember ever trying beta lactams     Patient Measurements: Height: '5\' 10"'$  (177.8 cm) Weight: 115.9 kg (255 lb 8 oz) IBW/kg (Calculated) : 73  Vital Signs: Temp: 97.8 F (36.6 C) (10/18 0741) Temp Source: Oral (10/18 0741) BP: 169/102 (10/18 0741) Pulse Rate: 87 (10/18 0741)  Labs: Recent Labs    12/19/21 1451 12/20/21 0108 12/21/21 0100 12/22/21 0106  HGB  --  12.9*  --   --   HCT  --  37.0*  --   --   PLT  --  241  --   --   LABPROT  --  16.3* 17.9* 20.0*  INR  --  1.3* 1.5* 1.7*  HEPRLOWMOCWT 1.15  --   --   --   CREATININE  --  1.03  --   --      Estimated Creatinine Clearance: 83.9 mL/min (by C-G formula based on SCr of 1.03 mg/dL).   Medical History: Past Medical History:  Diagnosis Date   Adenomatous colon polyp 07/2003   Heart valve problem    Dr [name]   Hypertension    Mitral valve disorders(424.0)    Nonrheumatic aortic valve stenosis 01/14/2021   Echo 9/22: EF 60-65, small pericardial effusion, mild AS (mean gradient 16 mmHg), trivial AI, mild MR (MR is eccentric - may be underestimated) // Echo 10/2021: EF 60-65, mod calcification of AV, trivial AI, mod-severe AS (DI 0.25, V-max 350 cm/s, mean 28.8), no RWMA, NL RVSF, severe LAE, mild RAE, small effusion w/o tamponade, ?mild-moderate MR (difficult due to eccentric jet)   Nonrheumatic mitral valve regurgitation 01/14/2021   Echo 10/2021: EF 60-65, moderate calcification of the aortic valve, trivial AI, moderate to severe aortic stenosis (DI 0.25, V-max 350 cm/s, mean gradient 28.8), no RWMA, normal RVSF, severe LAE, mild RAE, small pericardial effusion without tamponade, suspect mild-moderate MR (difficult to qualify degree due  to eccentric jet with coanda affect)   Obesity    OSA (obstructive sleep apnea)    cpap   Other and unspecified hyperlipidemia    Permanent atrial fibrillation ([place])    Zio Monitor 5/22: AFib - Avg HR 55; slowest 32; longest pause 3.9 sec; occ PVCs, 4 beat NSVT   Sleep apnea     Medications:   Assessment: 71 y.o. M presents with CP. S/p cath 10/13 which showed multivessel CAD. Plan for aggressive medical management instead of PCI. Pt on warfarin PTA for afib. Warfarin held for cath since 10/10 and reversed with vit K 2.'5mg'$  10/12 and '5mg'$  10/13. Warfarin restarted on 10/13. Note, patient also on aspirin, and doxycycline (10/11-16)  CBC stable. INR remains subtherapeutic at 1.7 today but trending up. No bleeding reported.  Spoke to patient about potentially switching to Eliquis and provided copay information on 10/16. Patient wanted to think about it  Home dose: 7.'5mg'$  daily  Goal of Therapy:  INR 2-3, anti-Xa level 0.6-1 (4 hrs post Lovenox) Monitor platelets by anticoagulation protocol: Yes   Plan:  For discharge today 10/18 Continue lovenox 100 mg q12 Repeat warfarin 10 mg po x 1 today 10/18 then patient to resume 7.'5mg'$  daily at discharge 10/19   Thank you for allowing [place] to participate in this patients care.  Erin Hearing PharmD., BCPS Clinical Pharmacist 12/22/2021 7:54 AM

## 2021-12-22 NOTE — TOC Progression Note (Signed)
Transition of Care [facility]) - Progression Note    Patient Details  Name: [name] MRN: [id number] Date of Birth: 11-Mar-1950  Transition [facility] Care [facility]) CM/SW Contact  Vinie Sill, [place] Phone Number: 12/22/2021, 12:04 PM  Clinical Narrative:     CSW sent text message to patient's nephew, [name]- informed of insurance authorization # [id number]-  however, Corey Harold was declined.  HTA denial notice given to the patient.   Informed [facility] anticipated d/c today.  TOC will continue to follow and assist with discharge planning.  Thurmond Butts, MSW, LCSW Clinical Social Worker    Expected Discharge Plan: Skilled Nursing Facility Barriers to Discharge: Continued Medical Work up, Ship broker, SNF Pending bed offer  Expected Discharge Plan and Services Expected Discharge Plan: [place] arrangements for the past 2 months: Apartment                                       Social Determinants of Health (SDOH) Interventions    Readmission Risk Interventions     No data to display

## 2021-12-22 NOTE — Progress Notes (Signed)
Physical Therapy Treatment Patient Details Name: [name] MRN: [id number] DOB: 01/09/1951 Today's Date: 12/22/2021   History of Present Illness Pt is 71 yo male admitted on 12/14/21 with indigestion, weakness, elevated troponin.  Heart cath on 10/13 and significant for severe multivessel CAD. Cardiology recommending medical management rather than PCI secondary to difficulty of intervention of the OM branch.. Pt with hx of HTN, aortic stenosis, afib.    PT Comments    Pt received supine and agreeable to session with continued progress, however pt continues to be limited by fatigue. Pt with increased tolerance for gait with personal RW and min guard this session, pt HR up to 112bpm max throughout with pt able to self pace with cues at start. Pt without c/o dizziness or lightheadedness. Pt with fair awareness of deficits, and pt continues to be limited by decreased activity tolerance and general weakness. Pt continues to benefit from skilled PT services to progress toward functional mobility goals.    Recommendations for follow up therapy are one component of a multi-disciplinary discharge planning process, led by the attending physician.  Recommendations may be updated based on patient status, additional functional criteria and insurance authorization.  Follow Up Recommendations  Skilled nursing-short term rehab (<3 hours/day) Can patient physically be transported by private vehicle: Yes   Assistance Recommended at Discharge Frequent or constant Supervision/Assistance  Patient can return home with the following A lot of help with walking and/or transfers;A lot of help with bathing/dressing/bathroom;Assistance with cooking/housework;Help with stairs or ramp for entrance   Equipment Recommendations  None recommended by PT    Recommendations for Other Services Rehab consult     Precautions / Restrictions Precautions Precautions: Fall Restrictions Weight Bearing Restrictions: No      Mobility  Bed Mobility Overal bed mobility: Needs Assistance Bed Mobility: Sit to Supine, Supine to Sit     Supine to sit: Min assist Sit to supine: Min assist   General bed mobility comments: min A for legs and increased time; use of bed rails to reposition    Transfers Overall transfer level: Needs assistance Equipment used: Rolling walker (2 wheels) Transfers: Sit to/from Stand Sit to Stand: Min assist, From elevated surface           General transfer comment: Pt likes RW turned backward and holds bar to stabilize with standing and then turns RW.    Ambulation/Gait Ambulation/Gait assistance: Min assist Gait Distance (Feet): 90 Feet Assistive device: Rolling walker (2 wheels) Gait Pattern/deviations: Step-to pattern, Decreased stride length, Trunk flexed Gait velocity: decreased     General Gait Details: Pt with very flexed trunk and RW forward - and had difficulty correcting due to fatigue. HR up to 112bpm pt with good self pacing with cues   Stairs             Wheelchair Mobility    Modified Rankin (Stroke Patients Only)       Balance Overall balance assessment: Needs assistance Sitting-balance support: Feet supported, No upper extremity supported Sitting balance-Leahy Scale: Fair     Standing balance support: Bilateral upper extremity supported, Reliant on assistive device for balance Standing balance-Leahy Scale: Poor                              Cognition Arousal/Alertness: Awake/alert Behavior During Therapy: WFL for tasks assessed/performed Overall Cognitive Status: Within Functional Limits for tasks assessed  Exercises      General Comments General comments (skin integrity, edema, etc.): BP stable throughot, no c/o dizziness      Pertinent Vitals/Pain Pain Assessment Pain Assessment: No/denies pain Pain Intervention(s): Monitored during session    Home  Living                          Prior Function            PT Goals (current goals can now be found in the care plan section) Acute Rehab PT Goals PT Goal Formulation: With patient/family Time For Goal Achievement: 12/31/21    Frequency    Min 3X/week      PT Plan      Co-evaluation              AM-PAC PT "6 Clicks" Mobility   Outcome Measure  Help needed turning from your back to your side while in a flat bed without using bedrails?: A Little Help needed moving from lying on your back to sitting on the side of a flat bed without using bedrails?: A Little Help needed moving to and from a bed to a chair (including a wheelchair)?: A Little Help needed standing up from a chair using your arms (e.g., wheelchair or bedside chair)?: A Little Help needed to walk in hospital room?: A Little Help needed climbing 3-5 steps with a railing? : Total 6 Click Score: 16    End of Session Equipment Utilized During Treatment: Gait belt Activity Tolerance: Patient limited by fatigue;Patient tolerated treatment well Patient left: in bed;with call bell/phone within reach;with bed alarm set Nurse Communication: Mobility status PT Visit Diagnosis: Other abnormalities of gait and mobility (R26.89);Muscle weakness (generalized) (M62.81)     Time: 0350-0938 PT Time Calculation (min) (ACUTE ONLY): 33 min  Charges:  $Gait Training: 8-22 mins $Therapeutic Activity: 8-22 mins                     Darika Ildefonso R. PTA Acute Rehabilitation Services Office: [place] 12/22/2021, 12:17 PM

## 2021-12-22 NOTE — Chronic Care Management (AMB) (Signed)
error 

## 2021-12-22 NOTE — Progress Notes (Signed)
Occupational Therapy Treatment Patient Details Name: [name] MRN: [id number] DOB: [birth year] Today's Date: 12/22/2021   History of present illness Pt is 71 yo male admitted on 12/14/21 with indigestion, weakness, elevated troponin.  Heart cath on 10/13 and significant for severe multivessel CAD. Cardiology recommending medical management rather than PCI secondary to difficulty of intervention of the OM branch.. Pt with hx of HTN, aortic stenosis, afib.   OT comments  [name] is making good progress towards his goals with plans to d/c today. Pt completed ADLs with up to mod A for LB ADLs and min A for transfers. He continues to be limited by generalized weakness and decreased activity tolerance. POC remains appropriate.    Recommendations for follow up therapy are one component of a multi-disciplinary discharge planning process, led by the attending physician.  Recommendations may be updated based on patient status, additional functional criteria and insurance authorization.    Follow Up Recommendations  Skilled nursing-short term rehab (<3 hours/day)    Assistance Recommended at Discharge Frequent or constant Supervision/Assistance  Patient can return home with the following  Assist for transportation;Help with stairs or ramp for entrance;Assistance with cooking/housework;A lot of help with bathing/dressing/bathroom;A lot of help with walking and/or transfers   Equipment Recommendations  None recommended by OT       Precautions / Restrictions Precautions Precautions: Fall Restrictions Weight Bearing Restrictions: No       Mobility Bed Mobility Overal bed mobility: Needs Assistance Bed Mobility: Sit to Supine, Supine to Sit     Supine to sit: Min assist Sit to supine: Min assist        Transfers Overall transfer level: Needs assistance Equipment used: Rolling walker (2 wheels) Transfers: Sit to/from Stand Sit to Stand: Min assist, From elevated surface            General transfer comment: Pt likes RW turned backward and holds bar to stabilize with standing and then turns RW.     Balance Overall balance assessment: Needs assistance Sitting-balance support: Feet supported, No upper extremity supported Sitting balance-Leahy Scale: Fair     Standing balance support: Bilateral upper extremity supported, Reliant on assistive device for balance Standing balance-Leahy Scale: Poor                             ADL either performed or assessed with clinical judgement   ADL Overall ADL's : Needs assistance/impaired                 Upper Body Dressing : Set up;Sitting   Lower Body Dressing: Sit to/from stand;Moderate assistance   Toilet Transfer: Min guard           Functional mobility during ADLs: Min guard General ADL Comments: completed ADLs in preparation for discharge. Assist to reach BLEs and for unsupported standing balance`    Extremity/Trunk Assessment Upper Extremity Assessment Upper Extremity Assessment: Generalized weakness   Lower Extremity Assessment Lower Extremity Assessment: Defer to PT evaluation        Vision       Perception Perception Perception: Within Functional Limits   Praxis Praxis Praxis: Intact    Cognition Arousal/Alertness: Awake/alert Behavior During Therapy: WFL for tasks assessed/performed Overall Cognitive Status: Within Functional Limits for tasks assessed                        General Comments VSS on RA    Pertinent Vitals/ Pain  Pain Assessment Pain Assessment: No/denies pain   Frequency  Min 2X/week        Progress Toward Goals  OT Goals(current goals can now be found in the care plan section)  Progress towards OT goals: Progressing toward goals  Acute Rehab OT Goals Patient Stated Goal: SNF for therapy OT Goal Formulation: With patient Time For Goal Achievement: 12/31/21 Potential to Achieve Goals: Good ADL Goals Pt Will Perform Grooming:  standing;with set-up Pt Will Perform Upper Body Dressing: sitting;Independently Pt Will Perform Lower Body Dressing: with supervision;sit to/from stand;with adaptive equipment Pt Will Transfer to Toilet: with modified independence;ambulating;regular height toilet Pt/caregiver will Perform Home Exercise Program: Increased strength;Both right and left upper extremity;With theraband;With Supervision  Plan Discharge plan remains appropriate       AM-PAC OT "6 Clicks" Daily Activity     Outcome Measure   Help from another person eating meals?: None Help from another person taking care of personal grooming?: None Help from another person toileting, which includes using toliet, bedpan, or urinal?: A Lot Help from another person bathing (including washing, rinsing, drying)?: A Lot Help from another person to put on and taking off regular upper body clothing?: A Little Help from another person to put on and taking off regular lower body clothing?: A Lot 6 Click Score: 17    End of Session Equipment Utilized During Treatment: Rolling walker (2 wheels)  OT Visit Diagnosis: Unsteadiness on feet (R26.81);Muscle weakness (generalized) (M62.81)   Activity Tolerance Patient tolerated treatment well   Patient Left in bed;with call bell/phone within reach;with family/visitor present   Nurse Communication Mobility status        Time: 3016-0109 OT Time Calculation (min): 16 min  Charges: OT General Charges $OT Visit: 1 Visit OT Treatments $Self Care/Home Management : 8-22 mins    Elliot Cousin 12/22/2021, 3:02 PM

## 2021-12-23 ENCOUNTER — Telehealth: Payer: Self-pay

## 2021-12-23 DIAGNOSIS — E785 Hyperlipidemia, unspecified: Secondary | ICD-10-CM | POA: Diagnosis not present

## 2021-12-23 DIAGNOSIS — I2510 Atherosclerotic heart disease of native coronary artery without angina pectoris: Secondary | ICD-10-CM | POA: Diagnosis not present

## 2021-12-23 DIAGNOSIS — G4733 Obstructive sleep apnea (adult) (pediatric): Secondary | ICD-10-CM | POA: Diagnosis not present

## 2021-12-23 DIAGNOSIS — L03311 Cellulitis of abdominal wall: Secondary | ICD-10-CM | POA: Diagnosis not present

## 2021-12-23 NOTE — Telephone Encounter (Signed)
Transition Care Management Unsuccessful Follow-up Telephone Call  Date of discharge and from where:  [facility] 12/22/2021  Attempts:  1st Attempt  Reason for unsuccessful TCM follow-up call:  Left voice message Juanda Crumble, [place] Direct Dial [phone]

## 2021-12-26 ENCOUNTER — Encounter: Payer: Self-pay | Admitting: Internal Medicine

## 2021-12-27 NOTE — Telephone Encounter (Signed)
Called and spoke with pt's nephew, Shanon Brow, as well as pt over the phone. Confirmed pt is at [place] will be managed by facility. Pt appreciated the call back and stated he would let anticoagulation clinic aware of he was discharged from facility.  Will forward message to address other medication questions.

## 2021-12-28 ENCOUNTER — Telehealth: Payer: Self-pay | Admitting: Pharmacist

## 2021-12-28 DIAGNOSIS — E785 Hyperlipidemia, unspecified: Secondary | ICD-10-CM | POA: Diagnosis not present

## 2021-12-28 DIAGNOSIS — I4891 Unspecified atrial fibrillation: Secondary | ICD-10-CM | POA: Diagnosis not present

## 2021-12-28 DIAGNOSIS — G4733 Obstructive sleep apnea (adult) (pediatric): Secondary | ICD-10-CM | POA: Diagnosis not present

## 2021-12-28 NOTE — Telephone Encounter (Signed)
Transition Care Management Unsuccessful Follow-up Telephone Call  Date of discharge and from where:  [facility] 12/22/2021  Attempts:  2nd Attempt  Reason for unsuccessful TCM follow-up call:  Left voice message Juanda Crumble, [place] Direct Dial [phone]

## 2021-12-28 NOTE — Chronic Care Management (AMB) (Signed)
    Chronic Care Management Pharmacy Assistant   Name: [name]  MRN: [id number] DOB: January 14, 1951  Reason for Encounter: Hospital follow up   Hospital visits:  Admitted to [facility] on 12/14/2021 due to Elevated troponin. Discharge date was 12/22/2021.  New?Medications Started at [facility] Discharge:?? aspirin EC 81 mg started 12/23/2021 enoxaparin 100 mg (LOVENOX) Medication Changes at Hospital Discharge: carvedilol (COREG) 12.5 mg take twice daily Medications Discontinued at Hospital Discharge: amLODipine 10 MG tablet (NORVASC) Iron (Ferrous Sulfate) 325 (65 Fe) MG Tabs ketoconazole 2 % cream (NIZORAL) meloxicam 15 MG tablet (MOBIC) Medications that remain the same after Hospital Discharge:??  -All other medications will remain the same.    Medications: Outpatient Encounter Medications as of 12/28/2021  Medication Sig   acetaminophen (TYLENOL) 325 MG tablet Take 650 mg by mouth daily as needed (pain).   aspirin EC 81 MG tablet Take 1 tablet (81 mg total) by mouth daily. Swallow whole.   carvedilol (COREG) 12.5 MG tablet Take 1 tablet (12.5 mg total) by mouth 2 (two) times daily with a meal.   cloNIDine (CATAPRES) 0.2 MG tablet Take 1 tablet (0.2 mg total) by mouth 2 (two) times daily.   doxazosin (CARDURA) 4 MG tablet Take 1 tablet (4 mg total) by mouth daily.   enoxaparin (LOVENOX) 100 MG/ML injection Inject 1 mL (100 mg total) into the skin every 12 (twelve) hours for 3 days.   ezetimibe (ZETIA) 10 MG tablet Take 1 tablet (10 mg total) by mouth daily.   hydrochlorothiazide (MICROZIDE) 12.5 MG capsule Take 1 capsule (12.5 mg total) by mouth daily. (Patient taking differently: Take 12.5 mg by mouth every evening.)   losartan (COZAAR) 100 MG tablet Take 1 tablet (100 mg total) by mouth daily.   lubiprostone (AMITIZA) 24 MCG capsule Take 1 capsule (24 mcg total) by mouth 2 (two) times daily.   OMEGA-3 FATTY ACIDS PO Take 1 capsule by mouth in the morning and at  bedtime.   warfarin (COUMADIN) 5 MG tablet TAKE 1 TO 1 AND 1/2 TABLETS BY MOUTH DAILY AS DIRECTED BY COUMADIN CLINIC (Patient taking differently: Take 7.5 mg by mouth every evening.)   No facility-administered encounter medications on file as of 12/28/2021.   Unable to reach patient after several attempts  Care Gaps: AWV - scheduled 03/21/2022 Last BP - 138/82 on 10/27/2021 AWV - never done - last done 03/18/2021 Shingrix - never done Covid - overdue Flu - due Colonoscopy - postponed Tdap - postponed Hep C Screen - postponed  Star Rating Drugs: Losartan 100 mg - last filled 09/20/2021 90 DS at [place] Pharmacist Assistant [phone]

## 2021-12-29 ENCOUNTER — Other Ambulatory Visit: Payer: Self-pay | Admitting: Internal Medicine

## 2021-12-29 DIAGNOSIS — I2510 Atherosclerotic heart disease of native coronary artery without angina pectoris: Secondary | ICD-10-CM | POA: Diagnosis not present

## 2021-12-29 DIAGNOSIS — I4821 Permanent atrial fibrillation: Secondary | ICD-10-CM

## 2021-12-29 DIAGNOSIS — I350 Nonrheumatic aortic (valve) stenosis: Secondary | ICD-10-CM | POA: Diagnosis not present

## 2021-12-29 DIAGNOSIS — I340 Nonrheumatic mitral (valve) insufficiency: Secondary | ICD-10-CM | POA: Diagnosis not present

## 2021-12-29 DIAGNOSIS — G4733 Obstructive sleep apnea (adult) (pediatric): Secondary | ICD-10-CM | POA: Diagnosis not present

## 2021-12-29 DIAGNOSIS — I214 Non-ST elevation (NSTEMI) myocardial infarction: Secondary | ICD-10-CM | POA: Diagnosis not present

## 2021-12-29 DIAGNOSIS — E785 Hyperlipidemia, unspecified: Secondary | ICD-10-CM | POA: Diagnosis not present

## 2021-12-29 DIAGNOSIS — I4891 Unspecified atrial fibrillation: Secondary | ICD-10-CM | POA: Diagnosis not present

## 2021-12-29 NOTE — Telephone Encounter (Signed)
Refill request for warfarin:  Last INR was 2.7 on 12/08/21 Next INR due on 02/02/22 LOV was 10/27/21  Lizbeth Bark MD  Refill approved.

## 2021-12-30 DIAGNOSIS — I214 Non-ST elevation (NSTEMI) myocardial infarction: Secondary | ICD-10-CM | POA: Diagnosis not present

## 2021-12-30 DIAGNOSIS — G4733 Obstructive sleep apnea (adult) (pediatric): Secondary | ICD-10-CM | POA: Diagnosis not present

## 2021-12-30 DIAGNOSIS — I4891 Unspecified atrial fibrillation: Secondary | ICD-10-CM | POA: Diagnosis not present

## 2022-01-03 ENCOUNTER — Telehealth (HOSPITAL_COMMUNITY): Payer: Self-pay | Admitting: Internal Medicine

## 2022-01-03 DIAGNOSIS — I214 Non-ST elevation (NSTEMI) myocardial infarction: Secondary | ICD-10-CM | POA: Diagnosis not present

## 2022-01-03 DIAGNOSIS — I2510 Atherosclerotic heart disease of native coronary artery without angina pectoris: Secondary | ICD-10-CM | POA: Diagnosis not present

## 2022-01-03 DIAGNOSIS — I1A0 Resistant hypertension: Secondary | ICD-10-CM | POA: Diagnosis not present

## 2022-01-03 DIAGNOSIS — G4733 Obstructive sleep apnea (adult) (pediatric): Secondary | ICD-10-CM | POA: Diagnosis not present

## 2022-01-03 DIAGNOSIS — D519 Vitamin B12 deficiency anemia, unspecified: Secondary | ICD-10-CM | POA: Diagnosis not present

## 2022-01-03 DIAGNOSIS — E785 Hyperlipidemia, unspecified: Secondary | ICD-10-CM | POA: Diagnosis not present

## 2022-01-03 DIAGNOSIS — I350 Nonrheumatic aortic (valve) stenosis: Secondary | ICD-10-CM | POA: Diagnosis not present

## 2022-01-03 DIAGNOSIS — L03311 Cellulitis of abdominal wall: Secondary | ICD-10-CM | POA: Diagnosis not present

## 2022-01-03 DIAGNOSIS — I4821 Permanent atrial fibrillation: Secondary | ICD-10-CM | POA: Diagnosis not present

## 2022-01-03 DIAGNOSIS — M6281 Muscle weakness (generalized): Secondary | ICD-10-CM | POA: Diagnosis not present

## 2022-01-03 NOTE — Telephone Encounter (Signed)
Echocardiogram was cancelled by patient for the reason below:  12/31/2021 3:44 PM [id number], CHLOE R  Cancel Rsn: Patient (pt states he does not need this echo anymore)  Order will be removed from the active echo WQ. If patient calls back or needs in the future we can reinstate the order or create a new one.

## 2022-01-06 ENCOUNTER — Telehealth: Payer: Self-pay | Admitting: Family Medicine

## 2022-01-06 DIAGNOSIS — I2510 Atherosclerotic heart disease of native coronary artery without angina pectoris: Secondary | ICD-10-CM | POA: Diagnosis not present

## 2022-01-06 DIAGNOSIS — D519 Vitamin B12 deficiency anemia, unspecified: Secondary | ICD-10-CM | POA: Diagnosis not present

## 2022-01-06 DIAGNOSIS — M6281 Muscle weakness (generalized): Secondary | ICD-10-CM | POA: Diagnosis not present

## 2022-01-06 DIAGNOSIS — L03311 Cellulitis of abdominal wall: Secondary | ICD-10-CM | POA: Diagnosis not present

## 2022-01-06 DIAGNOSIS — I1A0 Resistant hypertension: Secondary | ICD-10-CM | POA: Diagnosis not present

## 2022-01-06 DIAGNOSIS — I350 Nonrheumatic aortic (valve) stenosis: Secondary | ICD-10-CM | POA: Diagnosis not present

## 2022-01-06 DIAGNOSIS — G4733 Obstructive sleep apnea (adult) (pediatric): Secondary | ICD-10-CM | POA: Diagnosis not present

## 2022-01-06 DIAGNOSIS — I214 Non-ST elevation (NSTEMI) myocardial infarction: Secondary | ICD-10-CM | POA: Diagnosis not present

## 2022-01-06 DIAGNOSIS — I4821 Permanent atrial fibrillation: Secondary | ICD-10-CM | POA: Diagnosis not present

## 2022-01-06 DIAGNOSIS — E785 Hyperlipidemia, unspecified: Secondary | ICD-10-CM | POA: Diagnosis not present

## 2022-01-06 NOTE — Telephone Encounter (Signed)
Called [employer] given.

## 2022-01-06 NOTE — Telephone Encounter (Signed)
Please okay these orders  ?

## 2022-01-06 NOTE — Telephone Encounter (Signed)
Benjaman Kindler with [facility]  [phone]  *Okay to leave a detailed message  Verbal Orders for PT:  Once a wk for 2 Two wks - 1 One wk - 1

## 2022-01-06 NOTE — Progress Notes (Signed)
Cardiology Office Note:    Date:  01/07/2022   ID:  [name], DOB 20-Dec-1950, MRN [id number]  PCP:  [name], MD   [facility] Providers Cardiologist:  [name], MD     Referring MD: [name], MD   Chief Complaint: hospital follow-up CAD  History of Present Illness:    [name] is a pleasnat 71 y.o. male with a hx of  Permanent atrial fibrillation  Bradycardia Monitor 6/22: longest pause 3.9 sec >> beta-blocker reduced  Mitral regurgitation (trivial on Echocardiogram in 2018) Echocardiogram 9/22: mild MR (eccentric)  Mild aortic stenosis (mean 21 mmHg on Echocardiogram in 2018) Hypertension  Hyperlipidemia  OSA  Obesity   Longtime patient of Dr. Harrington Challenger, he has been maintained on Coumadin for atrial fibrillation and managed by our Coumadin clinic.  Seen by Richardson Dopp, PA on 01/15/2021.  He had prescribed a cardiac monitor for bradycardia in May 2022 which revealed some pauses the longest of which was 3.9 seconds.  His carvedilol was reduced.  Follow-up echocardiogram revealed normal EF and mild MR.  The jet was eccentric and may be underestimated.  It was reviewed with Dr. Harrington Challenger and she recommended patient proceed with TEE to better evaluate MR.  He is sedentary due to back and hip problems and travels in a wheelchair.  Uses a walker at home.  At the visit, patient was asymptomatic.  Due to his sedentary nature and comorbid conditions including obesity, it was decided to hold off on pursuing TEE.  Last cardiology clinic visit was with Dr. Harrington Challenger on 10/27/2021 at which time most recent echo images were again reviewed in regards to degree or aortic stenosis and degree of mitral regurgitation with patient and his nephew. Patient remained asymptomatic and recommendation to repeat echo in a few months.   Admission 10/10-10/18/23 for weakness and indigestion.  He had a mild elevation of troponin 51 ? 196 ? 334 ? 204, CT chest abdomen pelvis with extensive coronary  calcifications noted.  Lipoprotein a 123, LDL 92. Recommendation for right and left heart catheterization to define coronary anatomy. R/LHC 12/18/21 as outlined below. Decision for aggressive guideline directed medical therapy for atrial fibrillation and CAD. If no progression of angina or worsening CHF, plan to treat OM and LCx lesions medically. Discharged to SNF on 12/22/2021.  Today, he is here for hospital follow-up accompanied by his nephew who cares for him. He has returned home from SNF. Reports he is feeling progressively better and can complete his ADLs without any symptoms. Has home nursing/PT/OT currently. Concern about INR. Coumadin has been reduced to 3 mg daily, down from 7.5 mg daily. INR last checked  1 week ago, got up to 3.3 then got down to 2.7. Needs follow-up. Amlodipine was stopped during hospitalization, they are unsure why. Home BP is 120/80, no reading higher than 140. We discussed her current anti-hypertensives. Continues to take clonidine and doxazosin. Has been on clonidine for > 20 years. States "if it ain't broke don't fix it." Declines sleep test, sleeps in a recliner. Feels that past diagnosis of sleep apnea was in the setting of trying to sleep in a bed without an appropriate pillow.  He denies chest pain, shortness of breath, fatigue, palpitations, melena, hematuria, hemoptysis, diaphoresis, weakness, presyncope, syncope, orthopnea, and PND. Wearing compression stockings.   Past Medical History:  Diagnosis Date   Adenomatous colon polyp 07/2003   Heart valve problem    Dr [name]  Hypertension    Mitral valve disorders(424.0)    Nonrheumatic aortic valve stenosis 01/14/2021   Echo 9/22: EF 60-65, small pericardial effusion, mild AS (mean gradient 16 mmHg), trivial AI, mild MR (MR is eccentric - may be underestimated) // Echo 10/2021: EF 60-65, mod calcification of AV, trivial AI, mod-severe AS (DI 0.25, V-max 350 cm/s, mean 28.8), no RWMA, NL RVSF, severe LAE, mild  RAE, small effusion w/o tamponade, ?mild-moderate MR (difficult due to eccentric jet)   Nonrheumatic mitral valve regurgitation 01/14/2021   Echo 10/2021: EF 60-65, moderate calcification of the aortic valve, trivial AI, moderate to severe aortic stenosis (DI 0.25, V-max 350 cm/s, mean gradient 28.8), no RWMA, normal RVSF, severe LAE, mild RAE, small pericardial effusion without tamponade, suspect mild-moderate MR (difficult to qualify degree due to eccentric jet with coanda affect)   Obesity    OSA (obstructive sleep apnea)    cpap   Other and unspecified hyperlipidemia    Permanent atrial fibrillation ([place])    Zio Monitor 5/22: AFib - Avg HR 55; slowest 32; longest pause 3.9 sec; occ PVCs, 4 beat NSVT   Sleep apnea     Past Surgical History:  Procedure Laterality Date   COLONOSCOPY  06/09/2015   per Dr. Fuller Plan, adenomatous polyp, repeat in 7 yrs   FOOT SURGERY  at age 16   LUMBAR LAMINECTOMY/DECOMPRESSION MICRODISCECTOMY N/A 11/21/2012   Procedure: Lumbar Three to Lumbar Five Laminectomy;  Surgeon: Floyce Stakes, MD;  Location: [facility];  Service: Neurosurgery;  Laterality: N/A;  Lumbar Three to Lumbar Five Laminectomy   RIGHT/LEFT HEART CATH AND CORONARY ANGIOGRAPHY N/A 12/17/2021   Procedure: RIGHT/LEFT HEART CATH AND CORONARY ANGIOGRAPHY;  Surgeon: Leonie Man, MD;  Location: [facility];  Service: Cardiovascular;  Laterality: N/A;    Current Medications: Current Meds  Medication Sig   acetaminophen (TYLENOL) 325 MG tablet Take 650 mg by mouth daily as needed (pain).   aspirin EC 81 MG tablet Take 1 tablet (81 mg total) by mouth daily. Swallow whole.   carvedilol (COREG) 12.5 MG tablet Take 1 tablet (12.5 mg total) by mouth 2 (two) times daily with a meal.   cloNIDine (CATAPRES) 0.2 MG tablet Take 1 tablet (0.2 mg total) by mouth 2 (two) times daily.   doxazosin (CARDURA) 4 MG tablet Take 1 tablet (4 mg total) by mouth daily.   ezetimibe (ZETIA) 10 MG tablet Take  1 tablet (10 mg total) by mouth daily.   hydrochlorothiazide (MICROZIDE) 12.5 MG capsule Take 1 capsule (12.5 mg total) by mouth daily. (Patient taking differently: Take 12.5 mg by mouth every evening.)   losartan (COZAAR) 100 MG tablet Take 1 tablet (100 mg total) by mouth daily.   lubiprostone (AMITIZA) 24 MCG capsule Take 1 capsule (24 mcg total) by mouth 2 (two) times daily.   OMEGA-3 FATTY ACIDS PO Take 1 capsule by mouth in the morning and at bedtime.   [DISCONTINUED] warfarin (COUMADIN) 3 MG tablet Take 3 mg by mouth daily.     Allergies:   Statins and Penicillins   Social History   Socioeconomic History   Marital status: Single    Spouse name: Not on file   Number of children: 0   Years of education: Not on file   Highest education level: Not on file  Occupational History   Occupation: retired  Tobacco Use   Smoking status: Never   Smokeless tobacco: Never  Vaping Use   Vaping Use: Never used  Substance and Sexual Activity   Alcohol use: No    Alcohol/week: 0.0 standard drinks of alcohol   Drug use: No   Sexual activity: Not on file  Other Topics Concern   Not on file  Social History Narrative   Not on file   Social Determinants of Health   Financial Resource Strain: Low Risk  (03/18/2021)   Overall Financial Resource Strain (CARDIA)    Difficulty of Paying Living Expenses: Not hard at all  Food Insecurity: No Food Insecurity (12/15/2021)   Hunger Vital Sign    Worried About Running Out of Food in the Last Year: Never true    Ran Out of Food in the Last Year: Never true  Transportation Needs: No Transportation Needs (12/15/2021)   PRAPARE - Hydrologist (Medical): No    Lack of Transportation (Non-Medical): No  Physical Activity: Inactive (03/18/2021)   Exercise Vital Sign    Days of Exercise per Week: 0 days    Minutes of Exercise per Session: 0 min  Stress: No Stress Concern Present (03/18/2021)   [place]    Feeling of Stress : Not at all  Social Connections: Socially Isolated (03/18/2021)   Social Connection and Isolation Panel [NHANES]    Frequency of Communication with Friends and Family: Three times a week    Frequency of Social Gatherings with Friends and Family: Three times a week    Attends Religious Services: Never    Active Member of Clubs or Organizations: No    Attends Archivist Meetings: Never    Marital Status: Never married     Family History: The patient's family history includes Alzheimer's disease in his maternal aunt and mother; Heart disease in his father; Hypertension in his father; Stroke in his father. There is no history of Colon cancer or Hypercalcemia.  ROS:   Please see the history of present illness.   All other systems reviewed and are negative.  Labs/Other Studies Reviewed:    The following studies were reviewed today:  [facility] 12/18/2021    Prox LAD to Dist LAD lesion is 55% stenosed.   Mid RCA to Dist RCA lesion is 100% stenosed with 100% stenosed side branch in Acute Mrg.   1st Mrg lesion is 99% stenosed with 70% stenosed side branch in Lat 1st Mrg.   Dist Cx lesion is 70% stenosed with 100% stenosed side branch in 2nd Mrg.   LV end diastolic pressure is moderately elevated.   Hemodynamic findings consistent with mild pulmonary hypertension.   There is mild aortic valve stenosis.   POST-OPERATIVE DIAGNOSIS:  Relatively Normal Right Heart Cath Numbers: RAP mean 9 mmHg, RVP-EDP 37/4-9 mmHg PAP-mean 35/18-26 mmHg; PCWP 19/26 mmHg - 22 mmHg LVEDP-EDP 165/8-18 mmHg; AOP-MAP 150/81-107 mmHg Ao sat 96%, PA sat 65%. ` Cardiac Output 5.94, Cardiac Index 2.56 (mildly reduced Severe multivessel CAD: Small nondominant RCA (<1.5 mm) -100% CTO of the mid vessel Diffusely diseased 40 to 60% LAD from proximal to apical with no obvious focal lesion Large-caliber mostly dominant LCx with 2 major OM  branches, 2 PL's and a PDA OM1 has segmental irregular hazy ulcerated appearing likely calcified lesion just proximal to the bifurcation into 2 subbranches (not favorable for PCI, but could be attempted if necessary) OM 2 is CTO flush occlusion (cannot tell if it actually comes off of OM1 or comes off of the LCx as it fills via retrograde filling but  not all the way to the parent vessel The AV groove circumflex after occluded OM2 has an eccentric appearing 70% stenosis then normalizes prior to the final 3 branches (LPL 1 LPL 2 and PDA   PLAN OF CARE:  Return to nursing unit for ongoing care.  Okay to restart heparin or Lovenox in the morning 12/18/2021; can dose warfarin tonight Aggressive guideline directed medical therapy for A-fib, CAD Monitor for signs of concerning symptoms for angina or worsening CHF, would otherwise treat the OM and LCx lesions medically for now. Cath films were reviewed with Dr. Martinique to make decision of attempted PCI versus medical therapy.  His recommendation independent mine was also to recommend medical management based on the the difficulty of intervention of the OM branch which is probably the culprit based on the downstream bifurcation.  If he were to have significant symptoms, this could potentially be attempted but will be difficult.  The LCx does have a lesion which may or may not be flow-limiting.  It could also be attempted, if symptoms warranted.   Echo 12/15/21   1. Left ventricular ejection fraction, by estimation, is 55 to 60%. The  left ventricle has normal function. The left ventricle has no regional  wall motion abnormalities. Left ventricular diastolic parameters are  indeterminate.   2. Right ventricular systolic function is normal. The right ventricular  size is normal. Tricuspid regurgitation signal is inadequate for assessing  PA pressure.   3. Left atrial size was moderately dilated.   4. The mitral valve was not well visualized. Trivial mitral  valve  regurgitation (MR was reported as worse on last echo but mitral valve  poorly visualized on this echo). No evidence of mitral stenosis.   5. The aortic valve is tricuspid. There is moderate calcification of the  aortic valve. Aortic valve regurgitation is mild. Moderate aortic valve  stenosis. Aortic valve mean gradient measures 25.0 mmHg. Calculated AVA  1.5 cm^2.   6. Aortic dilatation noted. There is mild dilatation of the aortic root  and of the ascending aorta, measuring 40 mm.   7. The inferior vena cava is dilated in size with >50% respiratory  variability, suggesting right atrial pressure of 8 mmHg.   8. The patient was in atrial fibrillation.  Recent Labs: 12/14/2021: ALT 20 12/15/2021: TSH 0.579 12/20/2021: BUN 23; Creatinine, Ser 1.03; Hemoglobin 12.9; Platelets 241; Sodium 135 12/21/2021: Potassium 4.0  Recent Lipid Panel    Component Value Date/Time   CHOL 147 07/15/2021 0908   CHOL 163 03/29/2019 1655   TRIG 103.0 07/15/2021 0908   HDL 34.10 (L) 07/15/2021 0908   HDL 40 03/29/2019 1655   CHOLHDL 4 07/15/2021 0908   VLDL 20.6 07/15/2021 0908   LDLCALC 92 07/15/2021 0908   LDLCALC 103 (H) 03/29/2019 1655   LDLDIRECT 168.0 03/25/2014 0844     Risk Assessment/Calculations:    CHA2DS2-VASc Score = 2   This indicates a 2.2% annual risk of stroke. The patient's score is based upon: CHF History: 0 HTN History: 1 Diabetes History: 0 Stroke History: 0 Vascular Disease History: 0 Age Score: 1 Gender Score: 0    Physical Exam:    VS:  BP 114/70   Pulse 68   Ht _0  (1.778 m)   Wt 250 lb (113.4 kg)   SpO2 98%   BMI 35.87 kg/m     Wt Readings from Last 3 Encounters:  01/07/22 250 lb (113.4 kg)  12/17/21 255 lb 8 oz (115.9 kg)  10/27/21 263 lb (119.3 kg)     GEN: Obese male in no acute distress HEENT: Normal NECK: No JVD; No carotid bruits CARDIAC: Distant heart sounds, Irregular RR. Sounds like holosystolic murmur but is difficult to  auscultate. No rubs, gallops RESPIRATORY:  Clear to auscultation without rales, wheezing or rhonchi  ABDOMEN: Soft, non-tender, non-distended MUSCULOSKELETAL:  In compression stockings. No deformity. 2+ pedal pulses, equal bilaterally SKIN: Warm and dry NEUROLOGIC:  Alert and oriented x 3 PSYCHIATRIC:  Normal affect   EKG:  EKG is not ordered today.     Diagnoses:    1. Permanent atrial fibrillation ([place])   2. Hyperlipidemia LDL goal <70   3. Encounter for therapeutic drug monitoring   4. Essential hypertension   5. Long term current use of anticoagulant   6. Nonrheumatic aortic valve stenosis   7. Coronary artery disease involving native coronary artery of native heart without angina pectoris    Assessment and Plan:     CAD without angina: Severe multivessel CAD as outlined above on cath 12/18/21. Aggressive GDMT recommended. He denies chest pain, dyspnea, or other symptoms concerning for angina. No indication for further ischemic evaluation at this time. Would prefer to discontinue clonidine in place of amlodipine but he does not want to make any changes today. Continue aspirin, losartan, ezetimibe.   Permanent atrial fibrillation on chronic anticoagulation: HR is well-controlled. He is asymptomatic. No bleeding concerns. Has questions about Coumadin dosing, managed by [facility] as noted below.   Hypertension: BP is well-controlled today. Antihypertensives were adjusted during hospitalization and subsequent stay in rehab. I feel that he would be better managed on agents other than clonidine, however he is hesitant to make any changes today. Advised him to continue to monitor home BP and report concerns prior to next office visit.   Aortic stenosis: Moderate aortic valve stenosis with mean gradient 25 mmHg, AVA 1.5 cm on echo 12/15/2021.  Documentation of mild aortic stenosis on [facility] 12/18/21. He denies dyspnea, orthopnea, PND, chest pain, presyncope, and syncope.  We will continue to  follow clinically for now. He has follow-up appointment in 1 month.   Therapeutic drug monitoring: INR checked by Marcelle Overlie, RPH and coumadin dose appropriately adjusted. He has follow-up with [facility] in one week.   Hyperlipidemia LDL goal < 70: LDL 92 on 07/15/21. Lengthy discussion about need to keep LDL below 70 in the setting of known CAD.  He states severely intolerant of statins.  Has an appointment with Pharm.D. in a few weeks, wanted to know if it was necessary. Encouraged him to keep appointment for consideration of PCSK9i therapy.      Disposition: Keep your December appointment with Dr. Harrington Challenger  Medication Adjustments/Labs and Tests Ordered: Current medicines are reviewed at length with the patient today.  Concerns regarding medicines are outlined above.  Orders Placed This Encounter  Procedures   Lipid Profile   Comp Met (CMET)   No orders of the defined types were placed in this encounter.   Patient Instructions  Medication Instructions:   Your physician recommends that you continue on your current medications as directed. Please refer to the Current Medication list given to you today.   *If you need a refill on your cardiac medications before your next appointment, please call your pharmacy*   Lab Work:  TODAY!!!! LIPID/CMET.  If you have labs (blood work) drawn today and your tests are completely normal, you will receive your results only by: [place] (if you have MyChart)  OR A paper copy in the mail If you have any lab test that is abnormal or we need to change your treatment, we will call you to review the results.   Testing/Procedures:  None ordered.   Follow-Up: At [facility], you and your health needs are our priority.  As part of our continuing mission to provide you with exceptional heart care, we have created designated Provider Care Teams.  These Care Teams include your primary Cardiologist (physician) and Advanced Practice Providers  (APPs -  Physician Assistants and Nurse Practitioners) who all work together to provide you with the care you need, when you need it.  We recommend signing up for the patient portal called "MyChart".  Sign up information is provided on this After Visit Summary.  MyChart is used to connect with patients for Virtual Visits (Telemedicine).  Patients are able to view lab/test results, encounter notes, upcoming appointments, etc.  Non-urgent messages can be sent to your provider as well.   To learn more about what you can do with MyChart, go to [email or web address].    Your next appointment:   1 month(s)  The format for your next appointment:   In Person  Provider:   [name], MD     Important Information About Sugar         Signed, Emmaline Life, NP  01/07/2022 2:28 PM    [place]

## 2022-01-07 ENCOUNTER — Encounter: Payer: Self-pay | Admitting: Pharmacist

## 2022-01-07 ENCOUNTER — Ambulatory Visit (INDEPENDENT_AMBULATORY_CARE_PROVIDER_SITE_OTHER): Payer: PPO | Admitting: Pharmacist

## 2022-01-07 ENCOUNTER — Ambulatory Visit: Payer: PPO | Attending: Nurse Practitioner | Admitting: Nurse Practitioner

## 2022-01-07 ENCOUNTER — Encounter: Payer: Self-pay | Admitting: Nurse Practitioner

## 2022-01-07 VITALS — BP 114/70 | HR 68 | Ht 70.0 in | Wt 250.0 lb

## 2022-01-07 DIAGNOSIS — I350 Nonrheumatic aortic (valve) stenosis: Secondary | ICD-10-CM

## 2022-01-07 DIAGNOSIS — I2510 Atherosclerotic heart disease of native coronary artery without angina pectoris: Secondary | ICD-10-CM

## 2022-01-07 DIAGNOSIS — E785 Hyperlipidemia, unspecified: Secondary | ICD-10-CM

## 2022-01-07 DIAGNOSIS — Z7901 Long term (current) use of anticoagulants: Secondary | ICD-10-CM | POA: Diagnosis not present

## 2022-01-07 DIAGNOSIS — I4891 Unspecified atrial fibrillation: Secondary | ICD-10-CM | POA: Diagnosis not present

## 2022-01-07 DIAGNOSIS — I4821 Permanent atrial fibrillation: Secondary | ICD-10-CM | POA: Diagnosis not present

## 2022-01-07 DIAGNOSIS — I10 Essential (primary) hypertension: Secondary | ICD-10-CM | POA: Diagnosis not present

## 2022-01-07 DIAGNOSIS — Z5181 Encounter for therapeutic drug level monitoring: Secondary | ICD-10-CM

## 2022-01-07 LAB — COMPREHENSIVE METABOLIC PANEL
ALT: 15 IU/L (ref 0–44)
AST: 14 IU/L (ref 0–40)
Albumin/Globulin Ratio: 1.7 (ref 1.2–2.2)
Albumin: 3.9 g/dL (ref 3.8–4.8)
Alkaline Phosphatase: 106 IU/L (ref 44–121)
BUN/Creatinine Ratio: 17 (ref 10–24)
BUN: 15 mg/dL (ref 8–27)
Bilirubin Total: 0.6 mg/dL (ref 0.0–1.2)
CO2: 26 mmol/L (ref 20–29)
Calcium: 11.4 mg/dL — ABNORMAL HIGH (ref 8.6–10.2)
Chloride: 103 mmol/L (ref 96–106)
Creatinine, Ser: 0.9 mg/dL (ref 0.76–1.27)
Globulin, Total: 2.3 g/dL (ref 1.5–4.5)
Glucose: 110 mg/dL — ABNORMAL HIGH (ref 70–99)
Potassium: 4.6 mmol/L (ref 3.5–5.2)
Sodium: 137 mmol/L (ref 134–144)
Total Protein: 6.2 g/dL (ref 6.0–8.5)
eGFR: 91 mL/min/{1.73_m2} (ref >59)

## 2022-01-07 LAB — LIPID PANEL
Chol/HDL Ratio: 4 ratio (ref 0.0–5.0)
Cholesterol, Total: 159 mg/dL (ref 100–199)
HDL: 40 mg/dL (ref >39)
LDL Chol Calc (NIH): 100 mg/dL — ABNORMAL HIGH (ref 0–99)
Triglycerides: 106 mg/dL (ref 0–149)
VLDL Cholesterol Cal: 19 mg/dL (ref 5–40)

## 2022-01-07 LAB — POCT INR: INR: 1.3 — AB (ref 2–3)

## 2022-01-07 MED ORDER — WARFARIN SODIUM 5 MG PO TABS: 7.5000 mg | ORAL_TABLET | Freq: Every day | ORAL | 0 refills | Status: DC

## 2022-01-07 NOTE — Progress Notes (Signed)
This encounter was created in error - please disregard.

## 2022-01-07 NOTE — Patient Instructions (Signed)
Medication Instructions:   Your physician recommends that you continue on your current medications as directed. Please refer to the Current Medication list given to you today.   *If you need a refill on your cardiac medications before your next appointment, please call your pharmacy*   Lab Work:  TODAY!!!! LIPID/CMET.  If you have labs (blood work) drawn today and your tests are completely normal, you will receive your results only by: [place] (if you have MyChart) OR A paper copy in the mail If you have any lab test that is abnormal or we need to change your treatment, we will call you to review the results.   Testing/Procedures:  None ordered.   Follow-Up: At [facility], you and your health needs are our priority.  As part of our continuing mission to provide you with exceptional heart care, we have created designated Provider Care Teams.  These Care Teams include your primary Cardiologist (physician) and Advanced Practice Providers (APPs -  Physician Assistants and Nurse Practitioners) who all work together to provide you with the care you need, when you need it.  We recommend signing up for the patient portal called "MyChart".  Sign up information is provided on this After Visit Summary.  MyChart is used to connect with patients for Virtual Visits (Telemedicine).  Patients are able to view lab/test results, encounter notes, upcoming appointments, etc.  Non-urgent messages can be sent to your provider as well.   To learn more about what you can do with MyChart, go to [email or web address].    Your next appointment:   1 month(s)  The format for your next appointment:   In Person  Provider:   [name], MD     Important Information About Sugar

## 2022-01-07 NOTE — Patient Instructions (Signed)
Take 1.5 tablets (12.'5mg'$ ) tonight, 2 tablets ('10mg'$ ) tomorrow then continue taking Warfarin 1.5 tablets (7.'5mg'$ ) daily. Call us with any new medications scheduled for any procedures or any questions. Recheck in 1 week. Phone [phone] [facility] Clinic Main # [phone] fax [phone]

## 2022-01-12 ENCOUNTER — Encounter: Payer: Self-pay | Admitting: Family Medicine

## 2022-01-12 ENCOUNTER — Ambulatory Visit (INDEPENDENT_AMBULATORY_CARE_PROVIDER_SITE_OTHER): Payer: PPO | Admitting: Family Medicine

## 2022-01-12 VITALS — BP 120/78 | HR 64 | Temp 98.5°F | Wt 250.0 lb

## 2022-01-12 DIAGNOSIS — I214 Non-ST elevation (NSTEMI) myocardial infarction: Secondary | ICD-10-CM | POA: Diagnosis not present

## 2022-01-12 DIAGNOSIS — I350 Nonrheumatic aortic (valve) stenosis: Secondary | ICD-10-CM

## 2022-01-12 DIAGNOSIS — I4821 Permanent atrial fibrillation: Secondary | ICD-10-CM | POA: Diagnosis not present

## 2022-01-12 DIAGNOSIS — Z23 Encounter for immunization: Secondary | ICD-10-CM

## 2022-01-12 DIAGNOSIS — R531 Weakness: Secondary | ICD-10-CM | POA: Diagnosis not present

## 2022-01-12 NOTE — Progress Notes (Signed)
   Subjective:    Patient ID: [name], male    DOB: Jul 17, 1950, 71 y.o.   MRN: [id number]  HPI Here with his nephew for a transitional care follow up from a hospital stay at [facility] from 12-14-21 to 12-22-21, and then a rehab stay at [facility] until 12-31-21. Since then he has been back at his apartment living alone. He presented on 12-14-21 with the sudden onset of SOB and profound weakness. No chest pains. His troponin levels were high, so he was diagnosed with a NSTEMI. He has been in permanent atrial fibrillation. He had an ECHO showing an EF of 55-60% and mild cardiomegaly. He also has moderate AV stenosis that had not changed from previous studies. He had a left and right cardiac cath showing multivessel CAD, and medical management was recommended. He also had some pulmonary HTN, but no aneurysms. His WBC was mildly elevated to 11.3 on admission, and this was attributed to some perineal cellulitis. This was treated with Doxycycline, and the WBC was down to 7.1 on DC. His renal function remained normal throughout the stay. His BP has come down a bit from earlier this year, and the Amlodipine was stopped. He is currently getting visits from nursing, PT, and OT.    Review of Systems  Constitutional: Negative.   Respiratory: Negative.    Cardiovascular: Negative.   Gastrointestinal: Negative.   Genitourinary: Negative.   Neurological: Negative.        Objective:   Physical Exam Constitutional:      Appearance: He is not ill-appearing.     Comments: In a wheelchair   Cardiovascular:     Rate and Rhythm: Normal rate. Rhythm irregular.     Pulses: Normal pulses.     Comments: He has his usual 2/6 SM  Pulmonary:     Effort: Pulmonary effort is normal.     Breath sounds: Normal breath sounds.  Musculoskeletal:     Comments: 2+ edema in both ankles   Neurological:     Mental Status: He is alert.           Assessment & Plan:  He is recovering well from a recent NSTEMI.  His HTN and atrial fibrillation are well controlled. He will follow up with Dr. [name] on 02-07-22. Alysia Penna, MD

## 2022-01-14 ENCOUNTER — Ambulatory Visit: Payer: PPO | Attending: Cardiovascular Disease

## 2022-01-14 DIAGNOSIS — Z5181 Encounter for therapeutic drug level monitoring: Secondary | ICD-10-CM | POA: Diagnosis not present

## 2022-01-14 DIAGNOSIS — I4891 Unspecified atrial fibrillation: Secondary | ICD-10-CM | POA: Diagnosis not present

## 2022-01-14 LAB — POCT INR: INR: 3.5 — AB (ref 2.0–3.0)

## 2022-01-14 NOTE — Patient Instructions (Signed)
TAKE 1 TABLET TODAY ONLY and  then continue taking Warfarin 1.5 tablets (7.'5mg'$ ) daily. Call us with any new medications scheduled for any procedures or any questions. Recheck in 2 weeks. Phone [phone] Coumadin Clinic Main # [phone] fax [phone]

## 2022-01-25 ENCOUNTER — Ambulatory Visit: Payer: PPO | Attending: Internal Medicine | Admitting: *Deleted

## 2022-01-25 ENCOUNTER — Telehealth: Payer: Self-pay | Admitting: Student

## 2022-01-25 DIAGNOSIS — Z5181 Encounter for therapeutic drug level monitoring: Secondary | ICD-10-CM | POA: Diagnosis not present

## 2022-01-25 DIAGNOSIS — I4891 Unspecified atrial fibrillation: Secondary | ICD-10-CM

## 2022-01-25 LAB — POCT INR: INR: 6.9 — AB (ref 2.0–3.0)

## 2022-01-25 NOTE — Telephone Encounter (Signed)
   Received page from [facility] about STAT lab. Patient was seen in Coumadin Clinic today and POCT was elevated at 6.9. He was sent for stat recheck which came back at 7.4 Per Coumadin Clinic, patient was instructed to hold Coumadin tonight and tomorrow and that they would call him back tomorrow with additional instructions and to help arrange follow-up. I called patient to ensure that he was not having any abnormal bleeding which he is not. Reiterated instructions from Coumadin Clinic. Patient voiced understanding and thanked me for calling.   Darreld Mclean, PA-C 01/25/2022 7:54 PM

## 2022-01-26 LAB — PROTIME-INR
INR: 7.4 (ref 0.9–1.2)
Prothrombin Time: 67.6 s — ABNORMAL HIGH (ref 9.1–12.0)

## 2022-01-26 NOTE — Patient Instructions (Signed)
Description   11/21-Do not take any warfarin tonight or tomorrow/until we call you with results, POC 6.9 taken to lab for STAT INR. Pt aware will call in the AM with further instructions and an appt.  11/22-Spoke with pt and advised to hold warfarin today, hold warfarin tomorrow, hold warfarin Thursday then resume taking Warfarin 1.5 tablets (7.'5mg'$ ) daily. Call us with any new medications scheduled for any procedures or any questions. Recheck in 1 week. Phone [phone] [facility] Main # [phone] fax [phone]

## 2022-02-01 ENCOUNTER — Ambulatory Visit: Payer: PPO | Attending: Internal Medicine

## 2022-02-01 ENCOUNTER — Other Ambulatory Visit (HOSPITAL_COMMUNITY): Payer: PPO

## 2022-02-01 DIAGNOSIS — Z5181 Encounter for therapeutic drug level monitoring: Secondary | ICD-10-CM

## 2022-02-01 DIAGNOSIS — I4891 Unspecified atrial fibrillation: Secondary | ICD-10-CM

## 2022-02-01 LAB — POCT INR: INR: 2.7 (ref 2.0–3.0)

## 2022-02-01 NOTE — Patient Instructions (Signed)
CONTINUE taking Warfarin 1.5 tablets (7.'5mg'$ ) daily. Call us with any new medications scheduled for any procedures or any questions. Recheck in 2 weeks. Phone [phone] [facility] Main # [phone] fax [phone]

## 2022-02-06 NOTE — Progress Notes (Unsigned)
Cardiology Office Note   Date:  02/09/2022   ID:  [name], DOB 02-Nov-1950, MRN [id number]  PCP:  Blake Morale, MD  Cardiologist:   Blake Carnes, MD   Patient presents for follow up of AS, MR     History of Present Illness: [name] is a 71 y.o. male with a history of permanent atrial fibrillation, mitral regurgitation, aortic stenosis, HTN, bradycardia,  HL, morbid obesity and OSA.   I saw him a few years ago in clinic   He was last seen by [name] in cardiology in November 2022. The pt lives at home   Gets a lot of help from his nephrew.  Not very active   Denies CP   Says his breathing is OK   Denies palpitations   Denies dizziness BP at home 782N to 562Z/ systolic      I saw the pt in Aug 2023   He was admitted to [facility] in October 2023 with weakness, DOE   Troponin elevated (peak 334)   Cardiology consulted   Recomm R and L heart cathn   On 12/17/21 it showed   Normal filling pressures.   LHC showed severe multivessel dz    RCA nondominant   LAD with moderate dz.     LCx with OM1 with ulcerated plaque    OM2 with occlusion.      Given locatoin plan was for medical Rx   If failed hen intervention     The pt has been seen by [name]    SInce then he says he is breathing OK   He denies CP   No palpitaitons He has gone through therapy   Says hie is stronger     Current Meds  Medication Sig   acetaminophen (TYLENOL) 325 MG tablet Take 650 mg by mouth daily as needed (pain).   carvedilol (COREG) 12.5 MG tablet Take 1 tablet (12.5 mg total) by mouth 2 (two) times daily with a meal.   cloNIDine (CATAPRES) 0.2 MG tablet Take 1 tablet (0.2 mg total) by mouth 2 (two) times daily.   doxazosin (CARDURA) 4 MG tablet Take 1 tablet (4 mg total) by mouth daily.   ezetimibe (ZETIA) 10 MG tablet Take 1 tablet (10 mg total) by mouth daily.   hydrochlorothiazide (MICROZIDE) 12.5 MG capsule Take 1 capsule (12.5 mg total) by mouth daily.   losartan (COZAAR) 100 MG tablet Take 1 tablet  (100 mg total) by mouth daily.   lubiprostone (AMITIZA) 24 MCG capsule Take 1 capsule (24 mcg total) by mouth 2 (two) times daily.   OMEGA-3 FATTY ACIDS PO Take 1 capsule by mouth in the morning and at bedtime.   warfarin (COUMADIN) 5 MG tablet Take 1.5 tablets (7.5 mg total) by mouth daily.     Allergies:   Statins and Penicillins   Past Medical History:  Diagnosis Date   Adenomatous colon polyp 07/2003   Heart valve problem    Dr [name]   Hypertension    Mitral valve disorders(424.0)    Nonrheumatic aortic valve stenosis 01/14/2021   Echo 9/22: EF 60-65, small pericardial effusion, mild AS (mean gradient 16 mmHg), trivial AI, mild MR (MR is eccentric - may be underestimated) // Echo 10/2021: EF 60-65, mod calcification of AV, trivial AI, mod-severe AS (DI 0.25, V-max 350 cm/s, mean 28.8), no RWMA, NL RVSF, severe LAE, mild RAE, small effusion w/o tamponade, ?mild-moderate MR (difficult due to eccentric jet)  Nonrheumatic mitral valve regurgitation 01/14/2021   Echo 10/2021: EF 60-65, moderate calcification of the aortic valve, trivial AI, moderate to severe aortic stenosis (DI 0.25, V-max 350 cm/s, mean gradient 28.8), no RWMA, normal RVSF, severe LAE, mild RAE, small pericardial effusion without tamponade, suspect mild-moderate MR (difficult to qualify degree due to eccentric jet with coanda affect)   Obesity    OSA (obstructive sleep apnea)    cpap   Other and unspecified hyperlipidemia    Permanent atrial fibrillation ([place])    Zio Monitor 5/22: AFib - Avg HR 55; slowest 32; longest pause 3.9 sec; occ PVCs, 4 beat NSVT   Sleep apnea     Past Surgical History:  Procedure Laterality Date   COLONOSCOPY  06/09/2015   per Dr. Fuller Plan, adenomatous polyp, repeat in 7 yrs   FOOT SURGERY  at age 29   LUMBAR LAMINECTOMY/DECOMPRESSION MICRODISCECTOMY N/A 11/21/2012   Procedure: Lumbar Three to Lumbar Five Laminectomy;  Surgeon: Floyce Stakes, MD;  Location: [facility];  Service:  Neurosurgery;  Laterality: N/A;  Lumbar Three to Lumbar Five Laminectomy   RIGHT/LEFT HEART CATH AND CORONARY ANGIOGRAPHY N/A 12/17/2021   Procedure: RIGHT/LEFT HEART CATH AND CORONARY ANGIOGRAPHY;  Surgeon: Blake Man, MD;  Location: [place] [facility];  Service: Cardiovascular;  Laterality: N/A;     Social History:  The patient  reports that he has never smoked. He has never used smokeless tobacco. He reports that he does not drink alcohol and does not use drugs.   Family History:  The patient's family history includes Alzheimer's disease in his maternal aunt and mother; Heart disease in his father; Hypertension in his father; Stroke in his father.    ROS:  Please see the history of present illness. All other systems are reviewed and  Negative to the above problem except as noted.    PHYSICAL EXAM: VS:  BP 122/74   Pulse 68   Ht '5\' 10"'$  (1.778 m)   Wt 250 lb 3.2 oz (113.5 kg)   SpO2 98%   BMI 35.90 kg/m   BP on my check 138/82  GEN: Morbidly obese 71 yo  in no acute distress   Examined in wheelchair HEENT: normal  Neck: no JVD, carotid bruits,  Cardiac: RRR; Gr III/VI systolic murmur at base   Tr edema at ankles   Respiratory:  clear to auscultation bilaterally, normal work of breathing GI: Distended    Nontender    MS: no deformity Moving all extremities   Skin: warm and dry, no rash Neuro:  Strength and sensation are intact Psych: euthymic mood, full affect     EKG:  EKG is not ordered today  Cardiac cath  12/2021  Left ventricular ejection fraction, by estimation, is 55 to 60%. The left ventricle has normal function. The left ventricle has no regional wall motion abnormalities. Left ventricular diastolic parameters are indeterminate. 1. Right ventricular systolic function is normal. The right ventricular size is normal. Tricuspid regurgitation signal is inadequate for assessing PA pressure. 2. 3. Left atrial size was moderately dilated. The mitral valve was  not well visualized. Trivial mitral valve regurgitation (MR was reported as worse on last echo but mitral valve poorly visualized on this echo). No evidence of mitral stenosis. 4. The aortic valve is tricuspid. There is moderate calcification of the aortic valve. Aortic valve regurgitation is mild. Moderate aortic valve stenosis. Aortic valve mean gradient measures 25.0 mmHg. Calculated AVA 1.5 cm^2. 5. Aortic dilatation noted. There is  mild dilatation of the aortic root and of the ascending aorta, measuring 40 mm. 6. The inferior vena cava is dilated in size with >50% respiratory variability, suggesting right atrial pressure of 8 mmHg. 7. 8. The patient was in atrial fibrillation.  Echo  12/2021  Left ventricular ejection fraction, by estimation, is 55 to 60%. The left ventricle has normal function. The left ventricle has no regional wall motion abnormalities. Left ventricular diastolic parameters are indeterminate. 1. Right ventricular systolic function is normal. The right ventricular size is normal. Tricuspid regurgitation signal is inadequate for assessing PA pressure. 2. 3. Left atrial size was moderately dilated. The mitral valve was not well visualized. Trivial mitral valve regurgitation (MR was reported as worse on last echo but mitral valve poorly visualized on this echo). No evidence of mitral stenosis. 4. The aortic valve is tricuspid. There is moderate calcification of the aortic valve. Aortic valve regurgitation is mild. Moderate aortic valve stenosis. Aortic valve mean gradient measures 25.0 mmHg. Calculated AVA 1.5 cm^2. 5. Aortic dilatation noted. There is mild dilatation of the aortic root and of the ascending aorta, measuring 40 mm. 6. The inferior vena cava is dilated in size with >50% respiratory variability, suggesting right atrial pressure of 8 mmHg. 7. 8. The patient was in atrial fibrillation.   Lipid Panel    Component Value Date/Time   CHOL 159  01/07/2022 1016   TRIG 106 01/07/2022 1016   HDL 40 01/07/2022 1016   CHOLHDL 4.0 01/07/2022 1016   CHOLHDL 4 07/15/2021 0908   VLDL 20.6 07/15/2021 0908   LDLCALC 100 (H) 01/07/2022 1016   LDLDIRECT 168.0 03/25/2014 0844      Wt Readings from Last 3 Encounters:  02/07/22 250 lb 3.2 oz (113.5 kg)  01/12/22 250 lb (113.4 kg)  01/07/22 250 lb (113.4 kg)      ASSESSMENT AND PLAN:  1  CAD  Severe CAD noted above   Currently without pains   Plan for medical Rx   1  Aortic stenosis   Echo in October showed mod AS   Mean gradient 25 mm Hg   Mild AI     Will follow with periodic echoes       2  Mitral regurgitation  Difficult acoustic windows make it diffiulct but last echo in Oct MR appeared trivial  3  HTN  BP is controlled on current regimen   4  Atrial fibrillation   Permanent   Keep on rate control and coumadin    5   LIpids   Last LDL 100    Pt does not want to do injectoin   Will look into coverage of other agents   Follow up in spring   Current medicines are reviewed at length with the patient today.  The patient does not have concerns regarding medicines.  Signed, Blake Carnes, MD  02/09/2022 12:55 AM    [address], [place], [place]  [address] Phone: [phone]; Fax: [phone]

## 2022-02-07 ENCOUNTER — Ambulatory Visit: Payer: PPO | Attending: Internal Medicine | Admitting: Internal Medicine

## 2022-02-07 ENCOUNTER — Encounter: Payer: Self-pay | Admitting: Internal Medicine

## 2022-02-07 VITALS — BP 122/74 | HR 68 | Ht 70.0 in | Wt 250.2 lb

## 2022-02-07 DIAGNOSIS — I2510 Atherosclerotic heart disease of native coronary artery without angina pectoris: Secondary | ICD-10-CM | POA: Diagnosis not present

## 2022-02-07 NOTE — Patient Instructions (Signed)
Medication Instructions:   *If you need a refill on your cardiac medications before your next appointment, please call your pharmacy*   Lab Work:  If you have labs (blood work) drawn today and your tests are completely normal, you will receive your results only by: MyChart Message (if you have MyChart) OR A paper copy in the mail If you have any lab test that is abnormal or we need to change your treatment, we will call you to review the results.   Testing/Procedures:    Follow-Up: At [facility], you and your health needs are our priority.  As part of our continuing mission to provide you with exceptional heart care, we have created designated Provider Care Teams.  These Care Teams include your primary Cardiologist (physician) and Advanced Practice Providers (APPs -  Physician Assistants and Nurse Practitioners) who all work together to provide you with the care you need, when you need it.  We recommend signing up for the patient portal called "MyChart".  Sign up information is provided on this After Visit Summary.  MyChart is used to connect with patients for Virtual Visits (Telemedicine).  Patients are able to view lab/test results, encounter notes, upcoming appointments, etc.  Non-urgent messages can be sent to your provider as well.   To learn more about what you can do with MyChart, go to [email or web address].    Important Information About Sugar       

## 2022-02-11 ENCOUNTER — Ambulatory Visit: Payer: PPO

## 2022-02-11 ENCOUNTER — Ambulatory Visit: Payer: PPO | Attending: Interventional Cardiology | Admitting: Student

## 2022-02-11 DIAGNOSIS — E785 Hyperlipidemia, unspecified: Secondary | ICD-10-CM

## 2022-02-11 NOTE — Assessment & Plan Note (Signed)
Assessment:  LDL goal: < 55 mg/dl last LDLc 100 mg/dl (01/2022) Tolerates Zetia and mega-3 twice daily well without any side effects  Intolerance to moderate intensity and low intensity statins (Simvastatin 20 mg atorvastatin 20 mg -severe muscle aches pain ) Discussed next potential options (PCSK-9 inhibitors, bempedoic acid and inclisiran); cost, dosing efficacy, side effects  Patient does not want  injection option due to inconvenience and cost  Plan: Continue taking current medications (Zetia 10 mg daily and mega-3 twice daily) Will apply for PA for Nexlizet; will inform patient upon approval (prefers MyChart message) Will Lipid lab due in 2-3 months and uric acid baseline + in 1 month of bempedoic acid initiation

## 2022-02-11 NOTE — Progress Notes (Signed)
Patient ID: [name]                 DOB: [birth year]                    MRN: [id number]      HPI: [name] is a 71 y.o. male patient referred to lipid clinic by Dr.Ross. PMH is significant for permanent atrial fibrillation, mitral regurgitation, aortic stenosis, HTN, bradycardia, HL, morbid obesity and OSA.  Called patient today, to reviewed options for lowering LDL cholesterol, including ezetimibe, PCSK-9 inhibitors, bempedoic acid and inclisiran.  Discussed mechanisms of action, dosing, side effects and potential decreases in LDL cholesterol.  Also reviewed cost information and potential options for patient assistance. atient does not want to do any self injection and Leqvio would cost him high with his current insurance (~20% co pay). And we can not provide an exact cost until he get his 1st injection. So due to that reason he does not like to go on Leqvio. He is in agreement to start bempedoic acid.   Current Medications: Zetia 10 mg, omega-3 twice daily  Intolerances: Simvastatin 20 mg atorvastatin 20 mg -severe muscle aches pain  Risk Factors: Severe CAD, hx of NSTEMI, Mild plaquing of carotid arteries,Coronary calcifications on CT LDL goal: <55 mg/dl   Diet: non red meat,  fried food- only once month, plant based food, greens intake is persistent due to warfarin (maintains INR between 2.5-2.9)  Exercise: use walker,active around the house, finish rehab one month ago still doing exercises that were recommended   Family History: father- heart issues,  Social History:  EtOh: none Smoking:never Labs: Lipid Panel     Component Value Date/Time   CHOL 159 01/07/2022 1016   TRIG 106 01/07/2022 1016   HDL 40 01/07/2022 1016   CHOLHDL 4.0 01/07/2022 1016   CHOLHDL 4 07/15/2021 0908   VLDL 20.6 07/15/2021 0908   LDLCALC 100 (H) 01/07/2022 1016   LDLDIRECT 168.0 03/25/2014 0844   LABVLDL 19 01/07/2022 1016    Past Medical History:  Diagnosis Date   Adenomatous colon polyp  07/2003   Heart valve problem    Dr [name]   Hypertension    Mitral valve disorders(424.0)    Nonrheumatic aortic valve stenosis 01/14/2021   Echo 9/22: EF 60-65, small pericardial effusion, mild AS (mean gradient 16 mmHg), trivial AI, mild MR (MR is eccentric - may be underestimated) // Echo 10/2021: EF 60-65, mod calcification of AV, trivial AI, mod-severe AS (DI 0.25, V-max 350 cm/s, mean 28.8), no RWMA, NL RVSF, severe LAE, mild RAE, small effusion w/o tamponade, ?mild-moderate MR (difficult due to eccentric jet)   Nonrheumatic mitral valve regurgitation 01/14/2021   Echo 10/2021: EF 60-65, moderate calcification of the aortic valve, trivial AI, moderate to severe aortic stenosis (DI 0.25, V-max 350 cm/s, mean gradient 28.8), no RWMA, normal RVSF, severe LAE, mild RAE, small pericardial effusion without tamponade, suspect mild-moderate MR (difficult to qualify degree due to eccentric jet with coanda affect)   Obesity    OSA (obstructive sleep apnea)    cpap   Other and unspecified hyperlipidemia    Permanent atrial fibrillation ([place])    Zio Monitor 5/22: AFib - Avg HR 55; slowest 32; longest pause 3.9 sec; occ PVCs, 4 beat NSVT   Sleep apnea     Current Outpatient Medications on File Prior to Visit  Medication Sig Dispense Refill   acetaminophen (TYLENOL) 325 MG tablet Take 650 mg by  mouth daily as needed (pain).     carvedilol (COREG) 12.5 MG tablet Take 1 tablet (12.5 mg total) by mouth 2 (two) times daily with a meal. 60 tablet 0   cloNIDine (CATAPRES) 0.2 MG tablet Take 1 tablet (0.2 mg total) by mouth 2 (two) times daily. 180 tablet 3   doxazosin (CARDURA) 4 MG tablet Take 1 tablet (4 mg total) by mouth daily. 90 tablet 3   ezetimibe (ZETIA) 10 MG tablet Take 1 tablet (10 mg total) by mouth daily. 90 tablet 3   hydrochlorothiazide (MICROZIDE) 12.5 MG capsule Take 1 capsule (12.5 mg total) by mouth daily. 90 capsule 3   losartan (COZAAR) 100 MG tablet Take 1 tablet (100 mg total)  by mouth daily. 90 tablet 3   lubiprostone (AMITIZA) 24 MCG capsule Take 1 capsule (24 mcg total) by mouth 2 (two) times daily. 180 capsule 3   OMEGA-3 FATTY ACIDS PO Take 1 capsule by mouth in the morning and at bedtime.     warfarin (COUMADIN) 5 MG tablet Take 1.5 tablets (7.5 mg total) by mouth daily. 135 tablet 0   No current facility-administered medications on file prior to visit.    Allergies  Allergen Reactions   Statins Other (See Comments)    "Muscles were messed up"    Penicillins Other (See Comments)    Childhood reaction. Unknown per Pt, cannot remember ever trying beta lactams      Dyslipidemia Assessment:  LDL goal: < 55 mg/dl last LDLc 100 mg/dl (01/2022) Tolerates Zetia and mega-3 twice daily well without any side effects  Intolerance to moderate intensity and low intensity statins (Simvastatin 20 mg atorvastatin 20 mg -severe muscle aches pain ) Discussed next potential options (PCSK-9 inhibitors, bempedoic acid and inclisiran); cost, dosing efficacy, side effects  Patient does not want  injection option due to inconvenience and cost  Plan: Continue taking current medications (Zetia 10 mg daily and mega-3 twice daily) Will apply for PA for Nexlizet; will inform patient upon approval (prefers MyChart message) Will Lipid lab due in 2-3 months and uric acid baseline + in 1 month of bempedoic acid initiation    Thank you,  Cammy Copa, Pharm.D [facility] A Division of [facility] [address] [place], [place] [address]  Phone: [phone]; Fax: [phone]

## 2022-02-15 ENCOUNTER — Ambulatory Visit: Payer: PPO | Attending: Internal Medicine

## 2022-02-15 DIAGNOSIS — M6281 Muscle weakness (generalized): Secondary | ICD-10-CM | POA: Diagnosis not present

## 2022-02-15 DIAGNOSIS — I4821 Permanent atrial fibrillation: Secondary | ICD-10-CM | POA: Diagnosis not present

## 2022-02-15 DIAGNOSIS — Z5181 Encounter for therapeutic drug level monitoring: Secondary | ICD-10-CM | POA: Diagnosis not present

## 2022-02-15 DIAGNOSIS — I1A0 Resistant hypertension: Secondary | ICD-10-CM | POA: Diagnosis not present

## 2022-02-15 DIAGNOSIS — Z7982 Long term (current) use of aspirin: Secondary | ICD-10-CM | POA: Diagnosis not present

## 2022-02-15 DIAGNOSIS — I4891 Unspecified atrial fibrillation: Secondary | ICD-10-CM

## 2022-02-15 DIAGNOSIS — I2510 Atherosclerotic heart disease of native coronary artery without angina pectoris: Secondary | ICD-10-CM | POA: Diagnosis not present

## 2022-02-15 DIAGNOSIS — I350 Nonrheumatic aortic (valve) stenosis: Secondary | ICD-10-CM | POA: Diagnosis not present

## 2022-02-15 DIAGNOSIS — I214 Non-ST elevation (NSTEMI) myocardial infarction: Secondary | ICD-10-CM | POA: Diagnosis not present

## 2022-02-15 DIAGNOSIS — G4733 Obstructive sleep apnea (adult) (pediatric): Secondary | ICD-10-CM | POA: Diagnosis not present

## 2022-02-15 DIAGNOSIS — L03311 Cellulitis of abdominal wall: Secondary | ICD-10-CM | POA: Diagnosis not present

## 2022-02-15 DIAGNOSIS — E785 Hyperlipidemia, unspecified: Secondary | ICD-10-CM | POA: Diagnosis not present

## 2022-02-15 DIAGNOSIS — Z7901 Long term (current) use of anticoagulants: Secondary | ICD-10-CM | POA: Diagnosis not present

## 2022-02-15 DIAGNOSIS — D519 Vitamin B12 deficiency anemia, unspecified: Secondary | ICD-10-CM | POA: Diagnosis not present

## 2022-02-15 LAB — POCT INR: INR: 4.3 — AB (ref 2.0–3.0)

## 2022-02-15 NOTE — Patient Instructions (Signed)
HOLD TODAY ONLY THEN CONTINUE taking Warfarin 1.5 tablets (7.'5mg'$ ) daily. Call us with any new medications scheduled for any procedures or any questions. Recheck in 3 weeks. Phone [phone] [facility] Main # [phone] fax [phone]

## 2022-02-17 ENCOUNTER — Telehealth: Payer: Self-pay | Admitting: Pharmacist

## 2022-02-17 DIAGNOSIS — E785 Hyperlipidemia, unspecified: Secondary | ICD-10-CM

## 2022-02-17 MED ORDER — NEXLIZET 180-10 MG PO TABS: 1.0000 | ORAL_TABLET | Freq: Every day | ORAL | 3 refills | Status: DC

## 2022-02-17 NOTE — Telephone Encounter (Signed)
Nexlizet PA has been approved through 07/13/2022.  Key: [id number] Enrolled in the grant for co-pay assistance  CARD NO. [id number]   CARD STATUS Active   BIN [id number]   PCN [id number]   PC GROUP [id number]  Patient has been informed about approval and grant. Co-pay card information communicated via MyChart

## 2022-03-08 ENCOUNTER — Ambulatory Visit: Payer: PPO | Attending: Internal Medicine | Admitting: *Deleted

## 2022-03-08 DIAGNOSIS — Z5181 Encounter for therapeutic drug level monitoring: Secondary | ICD-10-CM

## 2022-03-08 DIAGNOSIS — I4891 Unspecified atrial fibrillation: Secondary | ICD-10-CM

## 2022-03-08 DIAGNOSIS — E785 Hyperlipidemia, unspecified: Secondary | ICD-10-CM | POA: Diagnosis not present

## 2022-03-08 LAB — POCT INR: POC INR: 4.9

## 2022-03-08 NOTE — Patient Instructions (Signed)
Description    Hold warfarin today and tomorrow, then START taking warfarin 1.5 tablets daily except for 1 tablet on Tuesday and Saturday. Recheck INR in 2 week.

## 2022-03-09 LAB — URIC ACID: Uric Acid: 5.7 mg/dL (ref 3.8–8.4)

## 2022-03-10 ENCOUNTER — Telehealth: Payer: Self-pay | Admitting: Pharmacist

## 2022-03-10 ENCOUNTER — Other Ambulatory Visit: Payer: Self-pay | Admitting: Pharmacist

## 2022-03-10 DIAGNOSIS — E785 Hyperlipidemia, unspecified: Secondary | ICD-10-CM

## 2022-03-10 NOTE — Telephone Encounter (Signed)
Patient informed about follow up uric acid lab in 4-5 weeks via MyChart.  (Started taking Nexlizet 03/09/2022)

## 2022-03-14 ENCOUNTER — Other Ambulatory Visit: Payer: Self-pay

## 2022-03-14 MED ORDER — CARVEDILOL 12.5 MG PO TABS: 12.5000 mg | ORAL_TABLET | Freq: Two times a day (BID) | ORAL | 3 refills | Status: DC

## 2022-03-21 ENCOUNTER — Ambulatory Visit (INDEPENDENT_AMBULATORY_CARE_PROVIDER_SITE_OTHER): Payer: PPO

## 2022-03-21 VITALS — Ht 70.0 in | Wt 250.0 lb

## 2022-03-21 DIAGNOSIS — Z0000 Encounter for general adult medical examination without abnormal findings: Secondary | ICD-10-CM | POA: Diagnosis not present

## 2022-03-21 NOTE — Patient Instructions (Addendum)
[name] , Thank you for taking time to come for your Medicare Wellness Visit. I appreciate your ongoing commitment to your health goals. Please review the following plan we discussed and let me know if I can assist you in the future.   These are the goals we discussed:  Goals       Stay Healthy (pt-stated)        This is a list of the screening recommended for you and due dates:  Health Maintenance  Topic Date Due   DTaP/Tdap/Td vaccine (1 - Tdap) Never done   COVID-19 Vaccine (4 - 2023-24 season) 04/06/2022*   Zoster (Shingles) Vaccine (1 of 2) 06/20/2022*   Colon Cancer Screening  03/22/2023*   Hepatitis C Screening: USPSTF Recommendation to screen - Ages 18-79 yo.  03/22/2023*   Medicare Annual Wellness Visit  03/22/2023   Pneumonia Vaccine  Completed   Flu Shot  Completed   HPV Vaccine  Aged Out  *Topic was postponed. The date shown is not the original due date.    Advanced directives: Please bring a copy of your health care power of attorney and living will to the office to be added to your chart at your convenience.   Conditions/risks identified: None  Next appointment: Follow up in one year for your annual wellness visit.    Preventive Care 29 Years and Older, Male  Preventive care refers to lifestyle choices and visits with your health care provider that can promote health and wellness. What does preventive care include? A yearly physical exam. This is also called an annual well check. Dental exams once or twice a year. Routine eye exams. Ask your health care provider how often you should have your eyes checked. Personal lifestyle choices, including: Daily care of your teeth and gums. Regular physical activity. Eating a healthy diet. Avoiding tobacco and drug use. Limiting alcohol use. Practicing safe sex. Taking low doses of aspirin every day. Taking vitamin and mineral supplements as recommended by your health care provider. What happens during an annual  well check? The services and screenings done by your health care provider during your annual well check will depend on your age, overall health, lifestyle risk factors, and family history of disease. Counseling  Your health care provider may ask you questions about your: Alcohol use. Tobacco use. Drug use. Emotional well-being. Home and relationship well-being. Sexual activity. Eating habits. History of falls. Memory and ability to understand (cognition). Work and work Statistician. Screening  You may have the following tests or measurements: Height, weight, and BMI. Blood pressure. Lipid and cholesterol levels. These may be checked every 5 years, or more frequently if you are over 35 years old. Skin check. Lung cancer screening. You may have this screening every year starting at age 21 if you have a 30-pack-year history of smoking and currently smoke or have quit within the past 15 years. Fecal occult blood test (FOBT) of the stool. You may have this test every year starting at age 31. Flexible sigmoidoscopy or colonoscopy. You may have a sigmoidoscopy every 5 years or a colonoscopy every 10 years starting at age 72. Prostate cancer screening. Recommendations will vary depending on your family history and other risks. Hepatitis C blood test. Hepatitis B blood test. Sexually transmitted disease (STD) testing. Diabetes screening. This is done by checking your blood sugar (glucose) after you have not eaten for a while (fasting). You may have this done every 1-3 years. Abdominal aortic aneurysm (AAA) screening. You may need  this if you are a current or former smoker. Osteoporosis. You may be screened starting at age 80 if you are at high risk. Talk with your health care provider about your test results, treatment options, and if necessary, the need for more tests. Vaccines  Your health care provider may recommend certain vaccines, such as: Influenza vaccine. This is recommended every  year. Tetanus, diphtheria, and acellular pertussis (Tdap, Td) vaccine. You may need a Td booster every 10 years. Zoster vaccine. You may need this after age 63. Pneumococcal 13-valent conjugate (PCV13) vaccine. One dose is recommended after age 52. Pneumococcal polysaccharide (PPSV23) vaccine. One dose is recommended after age 46. Talk to your health care provider about which screenings and vaccines you need and how often you need them. This information is not intended to replace advice given to you by your health care provider. Make sure you discuss any questions you have with your health care provider. Document Released: 03/20/2015 Document Revised: 11/11/2015 Document Reviewed: 12/23/2014 Elsevier Interactive Patient Education  2017 [place] Prevention in the Home Falls can cause injuries. They can happen to people of all ages. There are many things you can do to make your home safe and to help prevent falls. What can I do on the outside of my home? Regularly fix the edges of walkways and driveways and fix any cracks. Remove anything that might make you trip as you walk through a door, such as a raised step or threshold. Trim any bushes or trees on the path to your home. Use bright outdoor lighting. Clear any walking paths of anything that might make someone trip, such as rocks or tools. Regularly check to see if handrails are loose or broken. Make sure that both sides of any steps have handrails. Any raised decks and porches should have guardrails on the edges. Have any leaves, snow, or ice cleared regularly. Use sand or salt on walking paths during winter. Clean up any spills in your garage right away. This includes oil or grease spills. What can I do in the bathroom? Use night lights. Install grab bars by the toilet and in the tub and shower. Do not use towel bars as grab bars. Use non-skid mats or decals in the tub or shower. If you need to sit down in the shower, use a  plastic, non-slip stool. Keep the floor dry. Clean up any water that spills on the floor as soon as it happens. Remove soap buildup in the tub or shower regularly. Attach bath mats securely with double-sided non-slip rug tape. Do not have throw rugs and other things on the floor that can make you trip. What can I do in the bedroom? Use night lights. Make sure that you have a light by your bed that is easy to reach. Do not use any sheets or blankets that are too big for your bed. They should not hang down onto the floor. Have a firm chair that has side arms. You can use this for support while you get dressed. Do not have throw rugs and other things on the floor that can make you trip. What can I do in the kitchen? Clean up any spills right away. Avoid walking on wet floors. Keep items that you use a lot in easy-to-reach places. If you need to reach something above you, use a strong step stool that has a grab bar. Keep electrical cords out of the way. Do not use floor polish or wax that makes floors  slippery. If you must use wax, use non-skid floor wax. Do not have throw rugs and other things on the floor that can make you trip. What can I do with my stairs? Do not leave any items on the stairs. Make sure that there are handrails on both sides of the stairs and use them. Fix handrails that are broken or loose. Make sure that handrails are as long as the stairways. Check any carpeting to make sure that it is firmly attached to the stairs. Fix any carpet that is loose or worn. Avoid having throw rugs at the top or bottom of the stairs. If you do have throw rugs, attach them to the floor with carpet tape. Make sure that you have a light switch at the top of the stairs and the bottom of the stairs. If you do not have them, ask someone to add them for you. What else can I do to help prevent falls? Wear shoes that: Do not have high heels. Have rubber bottoms. Are comfortable and fit you  well. Are closed at the toe. Do not wear sandals. If you use a stepladder: Make sure that it is fully opened. Do not climb a closed stepladder. Make sure that both sides of the stepladder are locked into place. Ask someone to hold it for you, if possible. Clearly mark and make sure that you can see: Any grab bars or handrails. First and last steps. Where the edge of each step is. Use tools that help you move around (mobility aids) if they are needed. These include: Canes. Walkers. Scooters. Crutches. Turn on the lights when you go into a dark area. Replace any light bulbs as soon as they burn out. Set up your furniture so you have a clear path. Avoid moving your furniture around. If any of your floors are uneven, fix them. If there are any pets around you, be aware of where they are. Review your medicines with your doctor. Some medicines can make you feel dizzy. This can increase your chance of falling. Ask your doctor what other things that you can do to help prevent falls. This information is not intended to replace advice given to you by your health care provider. Make sure you discuss any questions you have with your health care provider. Document Released: 12/18/2008 Document Revised: 07/30/2015 Document Reviewed: 03/28/2014 Elsevier Interactive Patient Education  2017 [employer].

## 2022-03-21 NOTE — Progress Notes (Signed)
Subjective:   [name] is a 72 y.o. male who presents for Medicare Annual/Subsequent preventive examination.  Review of Systems    Virtual Visit via Telephone Note  I connected with  [name] on 03/21/22 at  3:00 PM EST by telephone and verified that I am speaking with the correct person using two identifiers.  Location: Patient: Home Provider: Office Persons participating in the virtual visit: patient/Nurse Health Advisor   I discussed the limitations, risks, security and privacy concerns of performing an evaluation and management service by telephone and the availability of in person appointments. The patient expressed understanding and agreed to proceed.  Interactive audio and video telecommunications were attempted between this nurse and patient, however failed, due to patient having technical difficulties OR patient did not have access to video capability.  We continued and completed visit with audio only.  Some vital signs may be absent or patient reported.   Criselda Peaches, LPN  Cardiac Risk Factors include: advanced age (>82mn, >>24women);hypertension;male gender     Objective:    Today's Vitals   03/21/22 1513  Weight: 250 lb (113.4 kg)  Height: '5\' 10"'$  (1.778 m)   Body mass index is 35.87 kg/m.     03/21/2022    3:22 PM 12/15/2021   10:00 PM 03/18/2021    3:32 PM 06/01/2015    1:51 PM 11/21/2012    6:27 AM  Advanced Directives  Does Patient Have a Medical Advance Directive? Yes Yes Yes Yes Patient has advance directive, copy not in chart  Type of Advance Directive [place] Living will;Healthcare Power of [place] will  Does patient want to make changes to medical advance directive?  No - Patient declined No - Patient declined    Copy of [place] Chart? No - copy requested No - copy  requested No - copy requested      Current Medications (verified) Outpatient Encounter Medications as of 03/21/2022  Medication Sig   acetaminophen (TYLENOL) 325 MG tablet Take 650 mg by mouth daily as needed (pain).   Bempedoic Acid-Ezetimibe (NEXLIZET) 180-10 MG TABS Take 1 tablet by mouth daily. (Stop taking ezetimibe when you start taking Nexlizet)   carvedilol (COREG) 12.5 MG tablet Take 1 tablet (12.5 mg total) by mouth 2 (two) times daily with a meal.   cloNIDine (CATAPRES) 0.2 MG tablet Take 1 tablet (0.2 mg total) by mouth 2 (two) times daily.   doxazosin (CARDURA) 4 MG tablet Take 1 tablet (4 mg total) by mouth daily.   hydrochlorothiazide (MICROZIDE) 12.5 MG capsule Take 1 capsule (12.5 mg total) by mouth daily.   losartan (COZAAR) 100 MG tablet Take 1 tablet (100 mg total) by mouth daily.   lubiprostone (AMITIZA) 24 MCG capsule Take 1 capsule (24 mcg total) by mouth 2 (two) times daily.   OMEGA-3 FATTY ACIDS PO Take 1 capsule by mouth in the morning and at bedtime.   warfarin (COUMADIN) 5 MG tablet Take 1.5 tablets (7.5 mg total) by mouth daily.   No facility-administered encounter medications on file as of 03/21/2022.    Allergies (verified) Statins and Penicillins   History: Past Medical History:  Diagnosis Date   Adenomatous colon polyp 07/2003   Heart valve problem    Dr PDorris Carnes  Hypertension    Mitral valve disorders(424.0)    Nonrheumatic aortic valve stenosis 01/14/2021  Echo 9/22: EF 60-65, small pericardial effusion, mild AS (mean gradient 16 mmHg), trivial AI, mild MR (MR is eccentric - may be underestimated) // Echo 10/2021: EF 60-65, mod calcification of AV, trivial AI, mod-severe AS (DI 0.25, V-max 350 cm/s, mean 28.8), no RWMA, NL RVSF, severe LAE, mild RAE, small effusion w/o tamponade, ?mild-moderate MR (difficult due to eccentric jet)   Nonrheumatic mitral valve regurgitation 01/14/2021   Echo 10/2021: EF 60-65, moderate calcification of the aortic  valve, trivial AI, moderate to severe aortic stenosis (DI 0.25, V-max 350 cm/s, mean gradient 28.8), no RWMA, normal RVSF, severe LAE, mild RAE, small pericardial effusion without tamponade, suspect mild-moderate MR (difficult to qualify degree due to eccentric jet with coanda affect)   Obesity    OSA (obstructive sleep apnea)    cpap   Other and unspecified hyperlipidemia    Permanent atrial fibrillation ([place])    Zio Monitor 5/22: AFib - Avg HR 55; slowest 32; longest pause 3.9 sec; occ PVCs, 4 beat NSVT   Sleep apnea    Past Surgical History:  Procedure Laterality Date   COLONOSCOPY  06/09/2015   per Dr. Fuller Plan, adenomatous polyp, repeat in 7 yrs   FOOT SURGERY  at age 74   LUMBAR LAMINECTOMY/DECOMPRESSION MICRODISCECTOMY N/A 11/21/2012   Procedure: Lumbar Three to Lumbar Five Laminectomy;  Surgeon: Floyce Stakes, MD;  Location: [facility];  Service: Neurosurgery;  Laterality: N/A;  Lumbar Three to Lumbar Five Laminectomy   RIGHT/LEFT HEART CATH AND CORONARY ANGIOGRAPHY N/A 12/17/2021   Procedure: RIGHT/LEFT HEART CATH AND CORONARY ANGIOGRAPHY;  Surgeon: Leonie Man, MD;  Location: [facility];  Service: Cardiovascular;  Laterality: N/A;   Family History  Problem Relation Age of Onset   Alzheimer's disease Mother    Stroke Father    Heart disease Father    Hypertension Father    Alzheimer's disease Maternal Aunt        x 3   Colon cancer Neg Hx    Hypercalcemia Neg Hx    Social History   Socioeconomic History   Marital status: Single    Spouse name: Not on file   Number of children: 0   Years of education: Not on file   Highest education level: Not on file  Occupational History   Occupation: retired  Tobacco Use   Smoking status: Never   Smokeless tobacco: Never  Vaping Use   Vaping Use: Never used  Substance and Sexual Activity   Alcohol use: No    Alcohol/week: 0.0 standard drinks of alcohol   Drug use: No   Sexual activity: Not on file  Other  Topics Concern   Not on file  Social History Narrative   Not on file   Social Determinants of Health   Financial Resource Strain: Low Risk  (03/21/2022)   Overall Financial Resource Strain (CARDIA)    Difficulty of Paying Living Expenses: Not hard at all  Food Insecurity: No Food Insecurity (03/21/2022)   Hunger Vital Sign    Worried About Running Out of Food in the Last Year: Never true    [place] in the Last Year: Never true  Transportation Needs: No Transportation Needs (03/21/2022)   PRAPARE - Hydrologist (Medical): No    Lack of Transportation (Non-Medical): No  Physical Activity: Insufficiently Active (03/21/2022)   Exercise Vital Sign    Days of Exercise per Week: 7 days    Minutes of Exercise  per Session: 20 min  Stress: No Stress Concern Present (03/21/2022)   [place]    Feeling of Stress : Not at all  Social Connections: Socially Isolated (03/21/2022)   Social Connection and Isolation Panel [NHANES]    Frequency of Communication with Friends and Family: More than three times a week    Frequency of Social Gatherings with Friends and Family: More than three times a week    Attends Religious Services: Never    Marine scientist or Organizations: No    Attends Music therapist: Never    Marital Status: Never married    Tobacco Counseling Counseling given: Not Answered   Clinical Intake:  Pre-visit preparation completed: No  Pain : No/denies pain     BMI - recorded: 35.87 Nutritional Status: BMI > 30  Obese Nutritional Risks: None Diabetes: No  How often do you need to have someone help you when you read instructions, pamphlets, or other written materials from your doctor or pharmacy?: 1 - Never  Diabetic?  No  Interpreter Needed?: No  Information entered by :: Rolene Arbour LPN   Activities of Daily Living    03/21/2022    3:20 PM  12/15/2021   10:00 PM  In your present state of health, do you have any difficulty performing the following activities:  Hearing? 0 0  Vision? 0 0  Difficulty concentrating or making decisions? 0 0  Walking or climbing stairs? 0 1  Dressing or bathing? 0 1  Doing errands, shopping? 0 1  Preparing Food and eating ? N   Using the Toilet? N   In the past six months, have you accidently leaked urine? N   Do you have problems with loss of bowel control? N   Managing your Medications? N   Managing your Finances? N   Housekeeping or managing your Housekeeping? N     Patient Care Team: [name], MD as PCP - Desma Paganini, MD as PCP - Cardiology (Cardiology) Viona Gilmore, [facility] (Inactive) as Pharmacist (Pharmacist)  Indicate any recent Medical Services you may have received from other than [facility] providers in the past year (date may be approximate).     Assessment:   This is a routine wellness examination for [place].  Hearing/Vision screen Hearing Screening - Comments:: Denies hearing difficulties   Vision Screening - Comments:: Wears rx glasses - up to date with routine eye exams with  Patient deferred  Dietary issues and exercise activities discussed: Current Exercise Habits: Home exercise routine, Type of exercise: strength training/weights, Time (Minutes): 20, Frequency (Times/Week): 7, Weekly Exercise (Minutes/Week): 140, Exercise limited by: None identified   Goals Addressed               This Visit's Progress     Stay Healthy (pt-stated)         Depression Screen    03/21/2022    3:18 PM 01/12/2022   10:39 AM 07/15/2021   10:04 AM 03/18/2021    3:23 PM 06/29/2020    9:58 AM 07/21/2016   11:23 AM  PHQ 2/9 Scores  PHQ - 2 Score 0 0 0 0 0 0  PHQ- 9 Score  0 1  2     Fall Risk    03/21/2022    3:21 PM 01/12/2022   10:39 AM 07/15/2021   10:03 AM 03/18/2021    3:30 PM 06/29/2020    9:23 AM  Fall  Risk   Falls in the past year? 0 0 0 0 1  Number falls  in past yr: 0 0 0 0 1  Injury with Fall? 0 0 0 0 0  Risk for fall due to : No Fall Risks No Fall Risks Impaired balance/gait  Impaired mobility  Risk for fall due to: Comment     Pt. in wheel chair  Follow up Falls prevention discussed Falls evaluation completed Falls evaluation completed  Falls evaluation completed    [place]:  Any stairs in or around the home? No  If so, are there any without handrails? No  Home free of loose throw rugs in walkways, pet beds, electrical cords, etc? Yes  Adequate lighting in your home to reduce risk of falls? Yes   ASSISTIVE DEVICES UTILIZED TO PREVENT FALLS:  Life alert? No  Use of a cane, walker or w/c? Yes  Grab bars in the bathroom? No  Shower chair or bench in shower? No  Elevated toilet seat or a handicapped toilet? No   TIMED UP AND GO:  Was the test performed? No . Audio visit   Cognitive Function:        03/21/2022    3:23 PM 03/18/2021    3:31 PM  6CIT Screen  What Year? 0 points 0 points  What month? 0 points 0 points  What time? 0 points 0 points  Count back from 20 0 points 0 points  Months in reverse 0 points 0 points  Repeat phrase 0 points 0 points  Total Score 0 points 0 points    Immunizations Immunization History  Administered Date(s) Administered   Fluad Quad(high Dose 65+) 01/12/2022   PFIZER Comirnaty(Gray Top)Covid-19 Tri-Sucrose Vaccine 05/16/2019, 06/06/2019, 03/03/2020   PNEUMOCOCCAL CONJUGATE-20 07/15/2021    TDAP status: Due, Education has been provided regarding the importance of this vaccine. Advised may receive this vaccine at local pharmacy or Health Dept. Aware to provide a copy of the vaccination record if obtained from local pharmacy or Health Dept. Verbalized acceptance and understanding.  Flu Vaccine status: Up to date  Pneumococcal vaccine status: Up to date  Covid-19 vaccine status: Completed vaccines  Qualifies for Shingles Vaccine? Yes   Zostavax  completed No   Shingrix Completed?: No.    Education has been provided regarding the importance of this vaccine. Patient has been advised to call insurance company to determine out of pocket expense if they have not yet received this vaccine. Advised may also receive vaccine at local pharmacy or Health Dept. Verbalized acceptance and understanding.  Screening Tests Health Maintenance  Topic Date Due   DTaP/Tdap/Td (1 - Tdap) Never done   COVID-19 Vaccine (4 - 2023-24 season) 04/06/2022 (Originally 11/05/2021)   Zoster Vaccines- Shingrix (1 of 2) 06/20/2022 (Originally 01/07/1970)   COLONOSCOPY (Pts 45-2yr Insurance coverage will need to be confirmed)  03/22/2023 (Originally 06/08/2020)   Hepatitis C Screening  03/22/2023 (Originally 01/08/1969)   Medicare Annual Wellness (AWV)  03/22/2023   Pneumonia Vaccine 72 Years old  Completed   INFLUENZA VACCINE  Completed   HPV VACCINES  Aged Out    Health Maintenance  Health Maintenance Due  Topic Date Due   DTaP/Tdap/Td (1 - Tdap) Never done    Colorectal cancer screening: Referral to GI placed Patient deferred. Pt aware the office will call re: appt.  Lung Cancer Screening: (Low Dose CT Chest recommended if Age 73-80years, 30 pack-year currently smoking OR have quit w/in 15years.)  does not qualify.     Additional Screening:  Hepatitis C Screening: does qualify; Completed Deferred  Vision Screening: Recommended annual ophthalmology exams for early detection of glaucoma and other disorders of the eye. Is the patient up to date with their annual eye exam?  Yes  Who is the provider or what is the name of the office in which the patient attends annual eye exams? Deferred If pt is not established with a provider, would they like to be referred to a provider to establish care? No .   Dental Screening: Recommended annual dental exams for proper oral hygiene  Community Resource Referral / Chronic Care Management:  CRR required this visit?   No   CCM required this visit?  No      Plan:     I have personally reviewed and noted the following in the patient's chart:   Medical and social history Use of alcohol, tobacco or illicit drugs  Current medications and supplements including opioid prescriptions. Patient is not currently taking opioid prescriptions. Functional ability and status Nutritional status Physical activity Advanced directives List of other physicians Hospitalizations, surgeries, and ER visits in previous 12 months Vitals Screenings to include cognitive, depression, and falls Referrals and appointments  In addition, I have reviewed and discussed with patient certain preventive protocols, quality metrics, and best practice recommendations. A written personalized care plan for preventive services as well as general preventive health recommendations were provided to patient.     Criselda Peaches, LPN   [id number]/[id number]   Nurse Notes: Patient due Hep-C Screening

## 2022-03-22 ENCOUNTER — Ambulatory Visit: Payer: PPO | Attending: Cardiology | Admitting: *Deleted

## 2022-03-22 DIAGNOSIS — Z5181 Encounter for therapeutic drug level monitoring: Secondary | ICD-10-CM

## 2022-03-22 DIAGNOSIS — I4891 Unspecified atrial fibrillation: Secondary | ICD-10-CM

## 2022-03-22 LAB — POCT INR: INR: 3.9 — AB (ref 2.0–3.0)

## 2022-03-22 NOTE — Patient Instructions (Addendum)
Description   Hold warfarin today then START taking warfarin 1.5 tablets daily except for 1 tablet on Tuesday, Thursday, and Saturday. Recheck INR in 2 weeks.  Anticoagulation Clinic [phone] or [phone]

## 2022-04-05 ENCOUNTER — Ambulatory Visit: Payer: PPO | Attending: Cardiology

## 2022-04-05 DIAGNOSIS — I4891 Unspecified atrial fibrillation: Secondary | ICD-10-CM

## 2022-04-05 DIAGNOSIS — Z5181 Encounter for therapeutic drug level monitoring: Secondary | ICD-10-CM

## 2022-04-05 LAB — POCT INR: INR: 3.7 — AB (ref 2.0–3.0)

## 2022-04-05 NOTE — Patient Instructions (Signed)
HOLD TONIGHT ONLY THEN DECREASE TO 1 TABLET DAILY, EXCEPT 1.5 TABLETS ON MONDAY, [place].Recheck INR in 2 weeks.  Anticoagulation Clinic [phone] or [phone]

## 2022-04-12 ENCOUNTER — Telehealth: Payer: Self-pay | Admitting: Pharmacist

## 2022-04-12 NOTE — Telephone Encounter (Signed)
Call to remind patient to go for Uric acid lab; started bempedoic early Jan 2024.   Patient states he has INR check due on Monday Feb 12 will get Uric acid lab done that visit.

## 2022-04-18 ENCOUNTER — Ambulatory Visit: Payer: PPO | Attending: Internal Medicine | Admitting: *Deleted

## 2022-04-18 DIAGNOSIS — I4891 Unspecified atrial fibrillation: Secondary | ICD-10-CM | POA: Diagnosis not present

## 2022-04-18 DIAGNOSIS — E785 Hyperlipidemia, unspecified: Secondary | ICD-10-CM | POA: Diagnosis not present

## 2022-04-18 DIAGNOSIS — Z5181 Encounter for therapeutic drug level monitoring: Secondary | ICD-10-CM | POA: Diagnosis not present

## 2022-04-18 LAB — POCT INR: POC INR: 2.5

## 2022-04-18 NOTE — Patient Instructions (Signed)
Description   CONTINUE TO TAKE WARFARIN 1 TABLET DAILY, EXCEPT 1.5 TABLETS ON MONDAY, [place].Recheck INR in 3 weeks.  Anticoagulation Clinic [phone] or [phone]

## 2022-04-19 LAB — URIC ACID: Uric Acid: 7 mg/dL (ref 3.8–8.4)

## 2022-05-11 ENCOUNTER — Ambulatory Visit: Payer: PPO | Attending: Internal Medicine | Admitting: Pharmacist

## 2022-05-11 DIAGNOSIS — Z5181 Encounter for therapeutic drug level monitoring: Secondary | ICD-10-CM

## 2022-05-11 DIAGNOSIS — I4891 Unspecified atrial fibrillation: Secondary | ICD-10-CM | POA: Diagnosis not present

## 2022-05-11 LAB — POCT INR: INR: 3 (ref 2.0–3.0)

## 2022-05-11 NOTE — Patient Instructions (Signed)
Description   CONTINUE TO TAKE WARFARIN 1 TABLET DAILY, EXCEPT 1.5 TABLETS ON MONDAY, [place].Recheck INR in 4 weeks.  Anticoagulation Clinic [phone] or [phone]

## 2022-05-19 ENCOUNTER — Encounter: Payer: Self-pay | Admitting: Gastroenterology

## 2022-06-08 ENCOUNTER — Ambulatory Visit: Payer: PPO | Attending: Internal Medicine

## 2022-06-08 DIAGNOSIS — I4891 Unspecified atrial fibrillation: Secondary | ICD-10-CM | POA: Diagnosis not present

## 2022-06-08 DIAGNOSIS — Z5181 Encounter for therapeutic drug level monitoring: Secondary | ICD-10-CM | POA: Diagnosis not present

## 2022-06-08 LAB — POCT INR: INR: 2.9 (ref 2.0–3.0)

## 2022-06-08 NOTE — Patient Instructions (Signed)
Description   CONTINUE TO TAKE WARFARIN 1 TABLET DAILY, EXCEPT 1.5 TABLETS ON MONDAY, [place]. Recheck INR in 5 weeks.  Anticoagulation Clinic [phone]

## 2022-06-13 ENCOUNTER — Telehealth: Payer: Self-pay

## 2022-06-13 NOTE — Progress Notes (Signed)
Care Management & Coordination Services Pharmacy Team  Reason for Encounter: Appointment Reminder  Contacted patient to confirm telephone appointment with Delano Metz, PharmD on 06/14/2022 at 3:00. Spoke with patient on 06/13/2022   Do you have any problems getting your medications? Patient denies  What is your top health concern you would like to discuss at your upcoming visit? Patient denies  Have you seen any other providers since your last visit with PCP? Patient denies  Care Gaps: AWV - completed 03/21/2022, scheduled 04/03/2023 Last BP - 122/74 on 02/08/2023 Tdap - never done Covid  - overdue Shingrix - postponed Colonoscopy - postponed Hep C Screen - postponed  Star Rating Drugs: Losartan 100 mg - last filled 04/21/2022 90 DS at [facility] CMA  Clinical Pharmacist Assistant [phone]

## 2022-06-14 ENCOUNTER — Ambulatory Visit: Payer: PPO

## 2022-06-14 NOTE — Progress Notes (Signed)
Care Management & Coordination Services Pharmacy Note  06/14/2022 Name:  [name] MRN:  [id number] DOB:  10/04/1950  Summary: BP at goal <130/80 LDL not at goal <70, nexlizet has since been added  Recommendations/Changes made from today's visit: -Continue to monitor BP at home 2-3x/week and be mindful of salt content in food -Order updated lipid panel prior to next cardio appt  Follow up plan: HLD in 3 months Pharmacist visit in 6 months   Subjective: Blake PoundsWayne D Kline is an 72 y.o. year old male who is a primary patient of Nelwyn SalisburyFry, Stephen A, MD.  The care coordination team was consulted for assistance with disease management and care coordination needs.    Engaged with patient by telephone for follow up visit.  Recent office visits: 03/21/22 Theresa MulliganBeverly Wilson, LPN - For AWV, no med changes  01/12/22 Gershon CraneStephen Fry, MD - For NSTEMI, Afib, no med changes, flu vaccine administered  Recent consult visits: 06/08/22 Memory DanceAbigail J Baynes, RN - For Anti-coag, INR 2.9, no med change  02/11/22 Carmela HurtVaishali Patel, Rph (Heart Care) - For HLD - START Nexlizet  02/07/22 Dietrich PatesPaula Ross, MD (Cardio) - For CAD, no med changes  Hospital visits: None in previous 6 months   Objective:  Lab Results  Component Value Date   CREATININE 0.90 01/07/2022   BUN 15 01/07/2022   GFR 82.61 07/15/2021   EGFR 91 01/07/2022   GFRNONAA >60 12/20/2021   GFRAA 72 04/17/2019   NA 137 01/07/2022   K 4.6 01/07/2022   CALCIUM 11.4 (H) 01/07/2022   CO2 26 01/07/2022   GLUCOSE 110 (H) 01/07/2022    Lab Results  Component Value Date/Time   HGBA1C 5.8 07/15/2021 09:08 AM   HGBA1C 5.8 06/29/2020 09:59 AM   GFR 82.61 07/15/2021 09:08 AM   GFR 76.27 06/29/2020 09:59 AM    Last diabetic Eye exam: No results found for: "HMDIABEYEEXA"  Last diabetic Foot exam: No results found for: "HMDIABFOOTEX"   Lab Results  Component Value Date   CHOL 159 01/07/2022   HDL 40 01/07/2022   LDLCALC 100 (H) 01/07/2022   LDLDIRECT 168.0  03/25/2014   TRIG 106 01/07/2022   CHOLHDL 4.0 01/07/2022       Latest Ref Rng & Units 01/07/2022   10:16 AM 12/14/2021    9:40 PM 07/15/2021    9:08 AM  Hepatic Function  Total Protein 6.0 - 8.5 g/dL 6.2  6.5  6.8   Albumin 3.8 - 4.8 g/dL 3.9  3.4  4.1   AST 0 - 40 IU/L 14  22  16    ALT 0 - 44 IU/L 15  20  17    Alk Phosphatase 44 - 121 IU/L 106  82  109   Total Bilirubin 0.0 - 1.2 mg/dL 0.6  0.9  0.7   Bilirubin, Direct 0.0 - 0.3 mg/dL   0.2     Lab Results  Component Value Date/Time   TSH 0.579 12/15/2021 04:20 AM   TSH 1.83 07/15/2021 09:08 AM   TSH 1.59 06/29/2020 09:59 AM   FREET4 0.92 06/29/2020 09:59 AM       Latest Ref Rng & Units 12/20/2021    1:08 AM 12/18/2021    1:02 AM 12/17/2021    4:46 PM  CBC  WBC 4.0 - 10.5 K/uL 7.1  6.3    Hemoglobin 13.0 - 17.0 g/dL 40.912.9  81.112.4  91.412.2   Hematocrit 39.0 - 52.0 % 37.0  36.5  36.0   Platelets 150 - 400  K/uL 241  204      Lab Results  Component Value Date/Time   VD25OH 20.47 (L) 10/14/2013 01:56 PM   VD25OH <10 (L) 07/18/2013 11:58 AM   VITAMINB12 74 (L) 12/15/2021 04:20 AM    Clinical ASCVD: Yes  The ASCVD Risk score (Arnett DK, et al., 2019) failed to calculate for the following reasons:   The patient has a prior MI or stroke diagnosis         03/21/2022    3:18 PM 01/12/2022   10:39 AM 07/15/2021   10:04 AM  Depression screen PHQ 2/9  Decreased Interest 0 0 0  Down, Depressed, Hopeless 0 0 0  PHQ - 2 Score 0 0 0  Altered sleeping  0 1  Tired, decreased energy  0 0  Change in appetite  0 0  Feeling bad or failure about yourself   0 0  Trouble concentrating  0 0  Moving slowly or fidgety/restless  0 0  Suicidal thoughts  0 0  PHQ-9 Score  0 1  Difficult doing work/chores  Not difficult at all Not difficult at all     Social History   Tobacco Use  Smoking Status Never  Smokeless Tobacco Never   BP Readings from Last 3 Encounters:  02/07/22 122/74  01/12/22 120/78  01/07/22 114/70   Pulse  Readings from Last 3 Encounters:  02/07/22 68  01/12/22 64  01/07/22 68   Wt Readings from Last 3 Encounters:  03/21/22 250 lb (113.4 kg)  02/07/22 250 lb 3.2 oz (113.5 kg)  01/12/22 250 lb (113.4 kg)   BMI Readings from Last 3 Encounters:  03/21/22 35.87 kg/m  02/07/22 35.90 kg/m  01/12/22 35.87 kg/m    Allergies  Allergen Reactions   Statins Other (See Comments)    "Muscles were messed up"    Penicillins Other (See Comments)    Childhood reaction. Unknown per Pt, cannot remember ever trying beta lactams     Medications Reviewed Today     Reviewed by Sherrill Raring, RPH (Pharmacist) on 06/14/22 at 1517  Med List Status: <None>   Medication Order Taking? Sig Documenting Provider Last Dose Status Informant  acetaminophen (TYLENOL) 325 MG tablet [id number] No Take 650 mg by mouth daily as needed (pain). [provider] Taking Active Self, Pharmacy Records, Family Member  Bempedoic Acid-Ezetimibe (NEXLIZET) 180-10 MG TABS [id number]  Take 1 tablet by mouth daily. (Stop taking ezetimibe when you start taking Nexlizet) Pricilla Riffle, MD  Active   carvedilol (COREG) 12.5 MG tablet [id number]  Take 1 tablet (12.5 mg total) by mouth 2 (two) times daily with a meal. Pricilla Riffle, MD  Active   cloNIDine (CATAPRES) 0.2 MG tablet [id number] No Take 1 tablet (0.2 mg total) by mouth 2 (two) times daily. [name], MD Taking Active Self, Pharmacy Records, Family Member  doxazosin (CARDURA) 4 MG tablet [id number] No Take 1 tablet (4 mg total) by mouth daily. [name], MD Taking Active Self, Pharmacy Records, Family Member  hydrochlorothiazide (MICROZIDE) 12.5 MG capsule [id number] No Take 1 capsule (12.5 mg total) by mouth daily. [name], MD Taking Active Self, Pharmacy Records, Family Member  losartan (COZAAR) 100 MG tablet [id number] No Take 1 tablet (100 mg total) by mouth daily. [name], MD Taking Active Self, Pharmacy Records, Family Member  lubiprostone  [facility]) 24 MCG capsule [id number] No Take 1 capsule (24 mcg total) by mouth 2 (two) times daily. [name],  MD Taking Active Self, Pharmacy Records, Family Member  OMEGA-3 FATTY ACIDS PO [id number] No Take 1 capsule by mouth in the morning and at bedtime. [provider] Taking Active Self, Pharmacy Records, Family Member  warfarin (COUMADIN) 5 MG tablet [id number] No Take 1.5 tablets (7.5 mg total) by mouth daily. Pricilla Riffle, MD Taking Active             SDOH:  (Social Determinants of Health) assessments and interventions performed: Yes SDOH Interventions    Flowsheet Row Care Coordination from 06/14/2022 in [facility] Clinical Support from 03[facility]21/2022 in [facility] [employer] Chronic Care Management from 05/08/2020 in [facility] [place]  SDOH Interventions     Food Insecurity Interventions Intervention Not Indicated Intervention Not Indicated --  Housing Interventions Intervention Not Indicated Intervention Not Indicated --  Transportation Interventions -- Intervention Not Indicated Intervention Not Indicated  Utilities Interventions -- Intervention Not Indicated --  Alcohol Usage Interventions -- Intervention Not Indicated (Score <7) --  Financial Strain Interventions -- Intervention Not Indicated --  Physical Activity Interventions -- Intervention Not Indicated --  Stress Interventions -- Intervention Not Indicated --  Social Connections Interventions -- Intervention Not Indicated --       Medication Assistance: None required.  Patient affirms current coverage meets needs.  Medication Access: Within the past 30 days, how often has patient missed a dose of medication? None Is a pillbox or other method used to improve adherence? Yes  Factors that may affect medication adherence? no barriers identified Are meds synced by current pharmacy? No  Are meds delivered by current pharmacy? No  Does patient experience  delays in picking up medications due to transportation concerns? No   [facility] Reviewed: Is patient disadvantaged to use [facility]?: No  Current Rx insurance plan: HTA Name and location of Current pharmacy:  [facility] [id number] - [address] [place] - [address] [place] [address] Phone: [phone] Fax: [phone]  [facility] services reviewed with patient today?: No  Patient requests to transfer care to [facility]?: No  Reason patient declined to change pharmacies: Not mentioned at this visit  Compliance/Adherence/Medication fill history: Care Gaps: AWV - completed 03/21/2022, scheduled 04/03/2023 Last BP - 122/74 on 02/08/2023 Tdap - never done Covid  - overdue Shingrix - postponed Colonoscopy - postponed Hep C Screen - postpone  Star-Rating Drugs: Losartan 100 mg - last filled 04/21/2022 90 DS at [facility]   Assessment/Plan   Hypertension (BP goal <130/80) -Controlled -Current treatment: Carvedilol 12.5mg  BID Clonidine 0.2mg  BID Doxazosin 4mg  1 qd HCTZ 12.5mg  1 qd Losartan 100mg  1 qd -Medications previously tried: None  -Current home readings: 120/76 -Current dietary habits: adds no salt to food, mindful of natural salt content of food -Current exercise habits: walks with a walker, limited due to back and hip pain -Denies hypotensive/hypertensive symptoms -Educated on BP goals and benefits of medications for prevention of heart attack, stroke and kidney damage; Daily salt intake goal < 2300 mg; Importance of home blood pressure monitoring; Proper BP monitoring technique; -Counseled to monitor BP at home 2-3x a week, document, and provide log at future appointments -Recommended to continue current medication  Hyperlipidemia: (LDL goal < 70) -Uncontrolled -Current treatment: Nexlizet 180-10mg  1 qd Appropriate, Query Effective (need updated labs) -Medications previously tried: statins  (not tolerated)  -Current dietary patterns: avoids fried food and tries to limit potatoes -Current exercise habits: see above -Educated on  Cholesterol goals;  Importance of limiting foods high in cholesterol; -Recommended to continue current medication Recommended updated lipid panel prior to cardio appt  Sherrill Raring Clinical Pharmacist [phone]

## 2022-07-13 ENCOUNTER — Ambulatory Visit: Payer: PPO | Attending: Cardiology

## 2022-07-13 DIAGNOSIS — Z5181 Encounter for therapeutic drug level monitoring: Secondary | ICD-10-CM

## 2022-07-13 DIAGNOSIS — I4891 Unspecified atrial fibrillation: Secondary | ICD-10-CM | POA: Diagnosis not present

## 2022-07-13 LAB — POCT INR: INR: 2.9 (ref 2.0–3.0)

## 2022-07-13 NOTE — Patient Instructions (Signed)
Description   CONTINUE TO TAKE WARFARIN 1 TABLET DAILY, EXCEPT 1.5 TABLETS ON MONDAY, WEDNESDAY AND FRIDAY. Recheck INR in 6 weeks.  Anticoagulation Clinic [phone]

## 2022-08-09 ENCOUNTER — Encounter: Payer: Self-pay | Admitting: Pharmacist

## 2022-08-09 DIAGNOSIS — G720 Drug-induced myopathy: Secondary | ICD-10-CM

## 2022-08-09 NOTE — Progress Notes (Signed)
[facility])                                            [facility] Quality Pharmacy Team                                        Statin Quality Measure Assessment    08/09/2022  DONOVANN ORRICK 1950-08-01 [id number]    Per review of chart and payor information, patient has a diagnosis of cardiovascular disease but is not currently filling a statin prescription.  This places patient into the [facility] (Statin Use In Patients with Cardiovascular Disease) measure for CMS.    Patient has documented trials of statins with reported myalgia, but no corresponding CPT codes that would exclude patient from [facility] measure.  Patient has an upcoming appointment 08/10/22.  If deemed therapeutically appropriate, a statin exclusion code could be associated with the upcoming visit. Associating a statin exclusion code would remove the patient from the measure.     Component Value Date/Time   CHOL 159 01/07/2022 1016   TRIG 106 01/07/2022 1016   HDL 40 01/07/2022 1016   CHOLHDL 4.0 01/07/2022 1016   CHOLHDL 4 07/15/2021 0908   VLDL 20.6 07/15/2021 0908   LDLCALC 100 (H) 01/07/2022 1016   LDLDIRECT 168.0 03/25/2014 0844     Please consider associating one of the following statin exclusion codes to the upcoming visit:    Code for past statin intolerance  (required annually)   Provider Requirements:  Must asociate code during an office visit or telehealth encounter   Drug Induced Myopathy G72.0   Myalgia M79.1   Myositis, unspecified M60.9   Myopathy, unspecified G72.9   Rhabdomyolysis M62.82     Plan:  Send note to Patient's Provider prior to the upcoming appointment.    Beecher Mcardle, PharmD, BCACP [facility] Clinical Pharmacist [phone]

## 2022-08-10 ENCOUNTER — Ambulatory Visit (INDEPENDENT_AMBULATORY_CARE_PROVIDER_SITE_OTHER): Payer: PPO | Admitting: Family Medicine

## 2022-08-10 ENCOUNTER — Encounter: Payer: Self-pay | Admitting: Family Medicine

## 2022-08-10 VITALS — BP 126/80 | HR 66 | Temp 97.6°F | Ht 70.0 in | Wt 248.0 lb

## 2022-08-10 DIAGNOSIS — Z0000 Encounter for general adult medical examination without abnormal findings: Secondary | ICD-10-CM

## 2022-08-10 DIAGNOSIS — I10 Essential (primary) hypertension: Secondary | ICD-10-CM | POA: Diagnosis not present

## 2022-08-10 DIAGNOSIS — Z860100 Personal history of colon polyps, unspecified: Secondary | ICD-10-CM

## 2022-08-10 LAB — CBC WITH DIFFERENTIAL/PLATELET
Basophils Absolute: 0 10*3/uL (ref 0.0–0.1)
Basophils Relative: 0.4 % (ref 0.0–3.0)
Eosinophils Absolute: 0 10*3/uL (ref 0.0–0.7)
Eosinophils Relative: 0.6 % (ref 0.0–5.0)
HCT: 36.4 % — ABNORMAL LOW (ref 39.0–52.0)
Hemoglobin: 12.1 g/dL — ABNORMAL LOW (ref 13.0–17.0)
Lymphocytes Relative: 21.2 % (ref 12.0–46.0)
Lymphs Abs: 1.4 10*3/uL (ref 0.7–4.0)
MCHC: 33.4 g/dL (ref 30.0–36.0)
MCV: 90.3 fl (ref 78.0–100.0)
Monocytes Absolute: 0.6 10*3/uL (ref 0.1–1.0)
Monocytes Relative: 9.3 % (ref 3.0–12.0)
Neutro Abs: 4.6 10*3/uL (ref 1.4–7.7)
Neutrophils Relative %: 68.5 % (ref 43.0–77.0)
Platelets: 248 10*3/uL (ref 150.0–400.0)
RBC: 4.03 Mil/uL — ABNORMAL LOW (ref 4.22–5.81)
RDW: 14.4 % (ref 11.5–15.5)
WBC: 6.7 10*3/uL (ref 4.0–10.5)

## 2022-08-10 LAB — HEPATIC FUNCTION PANEL
ALT: 11 U/L (ref 0–53)
AST: 17 U/L (ref 0–37)
Albumin: 3.9 g/dL (ref 3.5–5.2)
Alkaline Phosphatase: 71 U/L (ref 39–117)
Bilirubin, Direct: 0.2 mg/dL (ref 0.0–0.3)
Total Bilirubin: 0.6 mg/dL (ref 0.2–1.2)
Total Protein: 6.6 g/dL (ref 6.0–8.3)

## 2022-08-10 LAB — LIPID PANEL
Cholesterol: 111 mg/dL (ref 0–200)
HDL: 28.6 mg/dL — ABNORMAL LOW (ref >39.00)
LDL Cholesterol: 61 mg/dL (ref 0–99)
NonHDL: 82.09
Total CHOL/HDL Ratio: 4
Triglycerides: 107 mg/dL (ref 0.0–149.0)
VLDL: 21.4 mg/dL (ref 0.0–40.0)

## 2022-08-10 LAB — BASIC METABOLIC PANEL
BUN: 26 mg/dL — ABNORMAL HIGH (ref 6–23)
CO2: 23 mEq/L (ref 19–32)
Calcium: 10.8 mg/dL — ABNORMAL HIGH (ref 8.4–10.5)
Chloride: 105 mEq/L (ref 96–112)
Creatinine, Ser: 0.93 mg/dL (ref 0.40–1.50)
GFR: 81.99 mL/min (ref >60.00)
Glucose, Bld: 111 mg/dL — ABNORMAL HIGH (ref 70–99)
Potassium: 4.4 mEq/L (ref 3.5–5.1)
Sodium: 140 mEq/L (ref 135–145)

## 2022-08-10 LAB — TSH: TSH: 1.89 u[IU]/mL (ref 0.35–5.50)

## 2022-08-10 LAB — HEMOGLOBIN A1C: Hgb A1c MFr Bld: 5.8 % (ref 4.6–6.5)

## 2022-08-10 LAB — PSA: PSA: 0.06 ng/mL — ABNORMAL LOW (ref 0.10–4.00)

## 2022-08-10 MED ORDER — LUBIPROSTONE 24 MCG PO CAPS: 24.0000 ug | ORAL_CAPSULE | Freq: Two times a day (BID) | ORAL | 3 refills | Status: DC

## 2022-08-10 MED ORDER — DOXAZOSIN MESYLATE 4 MG PO TABS: 4.0000 mg | ORAL_TABLET | Freq: Every day | ORAL | 3 refills | Status: DC

## 2022-08-10 MED ORDER — HYDROCHLOROTHIAZIDE 12.5 MG PO CAPS: 12.5000 mg | ORAL_CAPSULE | Freq: Every day | ORAL | 3 refills | Status: DC

## 2022-08-10 MED ORDER — CLONIDINE HCL 0.2 MG PO TABS: 0.2000 mg | ORAL_TABLET | Freq: Two times a day (BID) | ORAL | 3 refills | Status: DC

## 2022-08-10 MED ORDER — LOSARTAN POTASSIUM 100 MG PO TABS: 100.0000 mg | ORAL_TABLET | Freq: Every day | ORAL | 3 refills | Status: DC

## 2022-08-10 NOTE — Progress Notes (Signed)
Subjective:    Patient ID: [name], male    DOB: 10-31-1950, 72 y.o.   MRN: [id number]  HPI Here with his nephew for a well exam. He has no particular complaints. He gets around his apartment by himself, but of course he needs assistance getting to doctor visits, etc. He relies on Lubiprostone to make his blowels move, and he still uses a laxative once or twice a month. He was started on Nexlizet by Dr. Tenny Craw a few months ago to try to get his LDL as low as possible. He is past due for a colonoscopy.    Review of Systems  Constitutional: Negative.   HENT: Negative.    Eyes: Negative.   Respiratory: Negative.    Cardiovascular: Negative.   Gastrointestinal:  Positive for constipation.  Genitourinary: Negative.   Musculoskeletal:  Positive for arthralgias and back pain.  Skin: Negative.   Neurological: Negative.   Psychiatric/Behavioral: Negative.         Objective:   Physical Exam Constitutional:      General: He is not in acute distress.    Appearance: He is well-developed. He is obese. He is not diaphoretic.     Comments: In a wheelchair   HENT:     Head: Normocephalic and atraumatic.     Right Ear: External ear normal.     Left Ear: External ear normal.     Nose: Nose normal.     Mouth/Throat:     Pharynx: No oropharyngeal exudate.  Eyes:     General: No scleral icterus.       Right eye: No discharge.        Left eye: No discharge.     Conjunctiva/sclera: Conjunctivae normal.     Pupils: Pupils are equal, round, and reactive to light.  Neck:     Thyroid: No thyromegaly.     Vascular: No JVD.     Trachea: No tracheal deviation.  Cardiovascular:     Rate and Rhythm: Normal rate. Rhythm irregular.     Pulses: Normal pulses.     Heart sounds: No murmur heard.    No friction rub. No gallop.     Comments: Soft 2/6 SM which is stable  Pulmonary:     Effort: Pulmonary effort is normal. No respiratory distress.     Breath sounds: Normal breath sounds. No wheezing  or rales.  Chest:     Chest wall: No tenderness.  Abdominal:     General: Bowel sounds are normal. There is no distension.     Palpations: Abdomen is soft. There is no mass.     Tenderness: There is no abdominal tenderness. There is no guarding or rebound.     Hernia: No hernia is present.  Genitourinary:    Penis: No tenderness.   Musculoskeletal:        General: No tenderness. Normal range of motion.     Cervical back: Neck supple.  Lymphadenopathy:     Cervical: No cervical adenopathy.  Skin:    General: Skin is warm and dry.     Coloration: Skin is not pale.     Findings: No erythema or rash.  Neurological:     General: No focal deficit present.     Mental Status: He is alert and oriented to person, place, and time.     Cranial Nerves: No cranial nerve deficit.     Motor: No abnormal muscle tone.     Coordination: Coordination normal.  Deep Tendon Reflexes: Reflexes are normal and symmetric. Reflexes normal.  Psychiatric:        Mood and Affect: Mood normal.        Behavior: Behavior normal.        Thought Content: Thought content normal.        Judgment: Judgment normal.           Assessment & Plan:  Well exam. We discussed diet and exercise. Get fasting labs. We will arrange for him to get another colonoscopy. He will see Dr. Tenny Craw on 08-31-22. Gershon Crane, MD

## 2022-08-11 ENCOUNTER — Other Ambulatory Visit: Payer: Self-pay

## 2022-08-11 ENCOUNTER — Observation Stay (HOSPITAL_COMMUNITY): Payer: PPO

## 2022-08-11 ENCOUNTER — Encounter (HOSPITAL_COMMUNITY): Payer: Self-pay

## 2022-08-11 ENCOUNTER — Emergency Department (HOSPITAL_COMMUNITY): Payer: PPO

## 2022-08-11 ENCOUNTER — Observation Stay (HOSPITAL_COMMUNITY)
Admission: EM | Admit: 2022-08-11 | Discharge: 2022-08-17 | Disposition: A | Discharge status: Home Health | Payer: PPO | Attending: Family Medicine | Admitting: Family Medicine

## 2022-08-11 DIAGNOSIS — I340 Nonrheumatic mitral (valve) insufficiency: Secondary | ICD-10-CM | POA: Insufficient documentation

## 2022-08-11 DIAGNOSIS — E669 Obesity, unspecified: Secondary | ICD-10-CM | POA: Diagnosis present

## 2022-08-11 DIAGNOSIS — G4733 Obstructive sleep apnea (adult) (pediatric): Secondary | ICD-10-CM | POA: Diagnosis not present

## 2022-08-11 DIAGNOSIS — I10 Essential (primary) hypertension: Secondary | ICD-10-CM | POA: Diagnosis not present

## 2022-08-11 DIAGNOSIS — I4821 Permanent atrial fibrillation: Secondary | ICD-10-CM | POA: Insufficient documentation

## 2022-08-11 DIAGNOSIS — I951 Orthostatic hypotension: Secondary | ICD-10-CM | POA: Insufficient documentation

## 2022-08-11 DIAGNOSIS — R42 Dizziness and giddiness: Principal | ICD-10-CM

## 2022-08-11 DIAGNOSIS — R29818 Other symptoms and signs involving the nervous system: Secondary | ICD-10-CM | POA: Diagnosis not present

## 2022-08-11 DIAGNOSIS — I480 Paroxysmal atrial fibrillation: Secondary | ICD-10-CM | POA: Insufficient documentation

## 2022-08-11 DIAGNOSIS — E785 Hyperlipidemia, unspecified: Secondary | ICD-10-CM | POA: Insufficient documentation

## 2022-08-11 DIAGNOSIS — I350 Nonrheumatic aortic (valve) stenosis: Secondary | ICD-10-CM

## 2022-08-11 DIAGNOSIS — R079 Chest pain, unspecified: Secondary | ICD-10-CM | POA: Diagnosis not present

## 2022-08-11 DIAGNOSIS — Z7901 Long term (current) use of anticoagulants: Secondary | ICD-10-CM | POA: Diagnosis not present

## 2022-08-11 LAB — CBC WITH DIFFERENTIAL/PLATELET
Abs Immature Granulocytes: 0.03 10*3/uL (ref 0.00–0.07)
Basophils Absolute: 0 10*3/uL (ref 0.0–0.1)
Basophils Relative: 0 %
Eosinophils Absolute: 0 10*3/uL (ref 0.0–0.5)
Eosinophils Relative: 1 %
HCT: 38.1 % — ABNORMAL LOW (ref 39.0–52.0)
Hemoglobin: 12.4 g/dL — ABNORMAL LOW (ref 13.0–17.0)
Immature Granulocytes: 1 %
Lymphocytes Relative: 16 %
Lymphs Abs: 1 10*3/uL (ref 0.7–4.0)
MCH: 29.5 pg (ref 26.0–34.0)
MCHC: 32.5 g/dL (ref 30.0–36.0)
MCV: 90.7 fL (ref 80.0–100.0)
Monocytes Absolute: 0.4 10*3/uL (ref 0.1–1.0)
Monocytes Relative: 6 %
Neutro Abs: 4.6 10*3/uL (ref 1.7–7.7)
Neutrophils Relative %: 76 %
Platelets: 233 10*3/uL (ref 150–400)
RBC: 4.2 MIL/uL — ABNORMAL LOW (ref 4.22–5.81)
RDW: 13.6 % (ref 11.5–15.5)
WBC: 6.1 10*3/uL (ref 4.0–10.5)
nRBC: 0 % (ref 0.0–0.2)

## 2022-08-11 LAB — COMPREHENSIVE METABOLIC PANEL
ALT: 14 U/L (ref 0–44)
AST: 17 U/L (ref 15–41)
Albumin: 3.3 g/dL — ABNORMAL LOW (ref 3.5–5.0)
Alkaline Phosphatase: 71 U/L (ref 38–126)
Anion gap: 8 (ref 5–15)
BUN: 21 mg/dL (ref 8–23)
CO2: 23 mmol/L (ref 22–32)
Calcium: 10.6 mg/dL — ABNORMAL HIGH (ref 8.9–10.3)
Chloride: 107 mmol/L (ref 98–111)
Creatinine, Ser: 0.93 mg/dL (ref 0.61–1.24)
GFR, Estimated: >60 mL/min (ref >60)
Glucose, Bld: 127 mg/dL — ABNORMAL HIGH (ref 70–99)
Potassium: 3.7 mmol/L (ref 3.5–5.1)
Sodium: 138 mmol/L (ref 135–145)
Total Bilirubin: 0.4 mg/dL (ref 0.3–1.2)
Total Protein: 6.4 g/dL — ABNORMAL LOW (ref 6.5–8.1)

## 2022-08-11 LAB — URINALYSIS, W/ REFLEX TO CULTURE (INFECTION SUSPECTED)
Bacteria, UA: NONE SEEN
Bilirubin Urine: NEGATIVE
Glucose, UA: NEGATIVE mg/dL
Hgb urine dipstick: NEGATIVE
Ketones, ur: NEGATIVE mg/dL
Leukocytes,Ua: NEGATIVE
Nitrite: NEGATIVE
Protein, ur: NEGATIVE mg/dL
Specific Gravity, Urine: 1.004 — ABNORMAL LOW (ref 1.005–1.030)
pH: 7 (ref 5.0–8.0)

## 2022-08-11 LAB — PROTIME-INR
INR: 2.9 — ABNORMAL HIGH (ref 0.8–1.2)
Prothrombin Time: 30.6 seconds — ABNORMAL HIGH (ref 11.4–15.2)

## 2022-08-11 LAB — MAGNESIUM: Magnesium: 1.9 mg/dL (ref 1.7–2.4)

## 2022-08-11 MED ORDER — WARFARIN - PHARMACIST DOSING INPATIENT: Freq: Every day | Status: DC

## 2022-08-11 MED ORDER — DIPHENHYDRAMINE HCL 50 MG/ML IJ SOLN
12.5000 mg | Freq: Once | INTRAMUSCULAR | Status: AC
  Administered 2022-08-11: 12.5 mg via INTRAVENOUS
  Filled 2022-08-11: qty 1

## 2022-08-11 MED ORDER — METOCLOPRAMIDE HCL 5 MG/ML IJ SOLN
5.0000 mg | Freq: Once | INTRAMUSCULAR | Status: AC
  Administered 2022-08-11: 5 mg via INTRAVENOUS
  Filled 2022-08-11: qty 2

## 2022-08-11 MED ORDER — CLONIDINE HCL 0.2 MG PO TABS
0.2000 mg | ORAL_TABLET | Freq: Two times a day (BID) | ORAL | Status: DC
  Administered 2022-08-11 – 2022-08-17 (×12): 0.2 mg via ORAL
  Filled 2022-08-11 (×12): qty 1

## 2022-08-11 MED ORDER — SODIUM CHLORIDE 0.9% FLUSH
3.0000 mL | Freq: Two times a day (BID) | INTRAVENOUS | Status: DC
  Administered 2022-08-11 – 2022-08-17 (×12): 3 mL via INTRAVENOUS

## 2022-08-11 MED ORDER — WARFARIN SODIUM 5 MG PO TABS
5.0000 mg | ORAL_TABLET | ORAL | Status: DC
  Administered 2022-08-11: 5 mg via ORAL
  Filled 2022-08-11: qty 1

## 2022-08-11 MED ORDER — HYDROCHLOROTHIAZIDE 12.5 MG PO TABS
12.5000 mg | ORAL_TABLET | Freq: Every day | ORAL | Status: DC
  Administered 2022-08-12 – 2022-08-17 (×6): 12.5 mg via ORAL
  Filled 2022-08-11 (×7): qty 1

## 2022-08-11 MED ORDER — SODIUM CHLORIDE 0.9 % IV BOLUS
1000.0000 mL | Freq: Once | INTRAVENOUS | Status: AC
  Administered 2022-08-11: 1000 mL via INTRAVENOUS

## 2022-08-11 MED ORDER — LUBIPROSTONE 24 MCG PO CAPS
24.0000 ug | ORAL_CAPSULE | Freq: Two times a day (BID) | ORAL | Status: DC
  Administered 2022-08-11 – 2022-08-17 (×12): 24 ug via ORAL
  Filled 2022-08-11 (×13): qty 1

## 2022-08-11 MED ORDER — DOXAZOSIN MESYLATE 4 MG PO TABS
4.0000 mg | ORAL_TABLET | Freq: Every day | ORAL | Status: DC
  Administered 2022-08-12 – 2022-08-17 (×6): 4 mg via ORAL
  Filled 2022-08-11 (×6): qty 1

## 2022-08-11 MED ORDER — ACETAMINOPHEN 650 MG RE SUPP: 650.0000 mg | Freq: Four times a day (QID) | RECTAL | Status: DC | PRN

## 2022-08-11 MED ORDER — ACETAMINOPHEN 325 MG PO TABS
650.0000 mg | ORAL_TABLET | Freq: Four times a day (QID) | ORAL | Status: DC | PRN
  Administered 2022-08-12 – 2022-08-15 (×7): 650 mg via ORAL
  Filled 2022-08-11 (×8): qty 2

## 2022-08-11 MED ORDER — LABETALOL HCL 5 MG/ML IV SOLN
5.0000 mg | INTRAVENOUS | Status: DC | PRN
  Administered 2022-08-12: 5 mg via INTRAVENOUS
  Filled 2022-08-11: qty 4

## 2022-08-11 MED ORDER — WARFARIN SODIUM 7.5 MG PO TABS
7.5000 mg | ORAL_TABLET | ORAL | Status: DC
  Administered 2022-08-12: 7.5 mg via ORAL
  Filled 2022-08-11: qty 1

## 2022-08-11 MED ORDER — LOSARTAN POTASSIUM 50 MG PO TABS
100.0000 mg | ORAL_TABLET | Freq: Every day | ORAL | Status: DC
  Administered 2022-08-12 – 2022-08-17 (×6): 100 mg via ORAL
  Filled 2022-08-11 (×6): qty 2

## 2022-08-11 MED ORDER — POLYETHYLENE GLYCOL 3350 17 G PO PACK
17.0000 g | PACK | Freq: Every day | ORAL | Status: DC | PRN
  Filled 2022-08-11: qty 1

## 2022-08-11 MED ORDER — MECLIZINE HCL 25 MG PO TABS
12.5000 mg | ORAL_TABLET | Freq: Once | ORAL | Status: AC
  Administered 2022-08-11: 12.5 mg via ORAL
  Filled 2022-08-11: qty 1

## 2022-08-11 MED ORDER — CARVEDILOL 12.5 MG PO TABS
12.5000 mg | ORAL_TABLET | Freq: Two times a day (BID) | ORAL | Status: DC
  Administered 2022-08-11 – 2022-08-17 (×12): 12.5 mg via ORAL
  Filled 2022-08-11 (×12): qty 1

## 2022-08-11 MED ORDER — BEMPEDOIC ACID-EZETIMIBE 180-10 MG PO TABS: 1.0000 | ORAL_TABLET | Freq: Every day | ORAL | Status: DC

## 2022-08-11 NOTE — ED Notes (Signed)
ED TO INPATIENT HANDOFF REPORT  ED Nurse Name and Phone #:  Theophilus Bones [phone]  S Name/Age/Gender [name] 72 y.o. male Room/Bed: H016C/H016C  Code Status   Code Status: Full Code  Home/SNF/Other Home Patient oriented to: self, place, time, and situation Is this baseline?  Disoriented to time intermittently, can't remember if blood pressure medication were taken this  morning.  Triage Complete: Triage complete  Chief Complaint Vertigo [R42]  Triage Note Pt present to ED from home with c/o hypertension. Pt denies chest pain, shortness of breath at this time. States normal blood pressure result at PCP yesterday.   Allergies Allergies  Allergen Reactions   Statins Other (See Comments)    "Muscles were messed up"    Penicillins Other (See Comments)    Childhood reaction. Unknown per Pt, cannot remember ever trying beta lactams     Level of Care/Admitting Diagnosis ED Disposition     ED Disposition  Admit   Condition  --   Comment  Hospital Area: [facility] [[id number]]  Level of Care: Telemetry Medical [104]  May place patient in observation at [facility] or [facility] if equivalent level of care is available:: No  Covid Evaluation: Asymptomatic - no recent exposure (last 10 days) testing not required  Diagnosis: Vertigo [207257]  Admitting Physician: Synetta Fail [[id number]]  Attending Physician: Synetta Fail [[id number]]          B Medical/Surgery History Past Medical History:  Diagnosis Date   Adenomatous colon polyp 07/2003   Heart valve problem    Dr [name]   Hypertension    Mitral valve disorders(424.0)    Nonrheumatic aortic valve stenosis 01/14/2021   Echo 9/22: EF 60-65, small pericardial effusion, mild AS (mean gradient 16 mmHg), trivial AI, mild MR (MR is eccentric - may be underestimated) // Echo 10/2021: EF 60-65, mod calcification of AV, trivial AI, mod-severe AS (DI 0.25, V-max 350 cm/s, mean 28.8), no RWMA, NL  RVSF, severe LAE, mild RAE, small effusion w/o tamponade, ?mild-moderate MR (difficult due to eccentric jet)   Nonrheumatic mitral valve regurgitation 01/14/2021   Echo 10/2021: EF 60-65, moderate calcification of the aortic valve, trivial AI, moderate to severe aortic stenosis (DI 0.25, V-max 350 cm/s, mean gradient 28.8), no RWMA, normal RVSF, severe LAE, mild RAE, small pericardial effusion without tamponade, suspect mild-moderate MR (difficult to qualify degree due to eccentric jet with coanda affect)   Obesity    OSA (obstructive sleep apnea)    cpap   Other and unspecified hyperlipidemia    Permanent atrial fibrillation (HCC)    Zio Monitor 5/22: AFib - Avg HR 55; slowest 32; longest pause 3.9 sec; occ PVCs, 4 beat NSVT   Sleep apnea    Past Surgical History:  Procedure Laterality Date   COLONOSCOPY  06/09/2015   per Dr. Russella Dar, adenomatous polyp, repeat in 7 yrs   FOOT SURGERY  at age 59   LUMBAR LAMINECTOMY/DECOMPRESSION MICRODISCECTOMY N/A 11/21/2012   Procedure: Lumbar Three to Lumbar Five Laminectomy;  Surgeon: Karn Cassis, MD;  Location: [facility] NEURO ORS;  Service: Neurosurgery;  Laterality: N/A;  Lumbar Three to Lumbar Five Laminectomy   RIGHT/LEFT HEART CATH AND CORONARY ANGIOGRAPHY N/A 12/17/2021   Procedure: RIGHT/LEFT HEART CATH AND CORONARY ANGIOGRAPHY;  Surgeon: Marykay Lex, MD;  Location: [facility] INVASIVE CV LAB;  Service: Cardiovascular;  Laterality: N/A;     A IV Location/Drains/Wounds Patient Lines/Drains/Airways Status     Active Line/Drains/Airways  Name Placement date Placement time Site Days   Peripheral IV 08/11/22 20 G 1" Right Antecubital 08/11/22  1300  Antecubital  less than 1   Incision 11/21/12 Back Bilateral 11/21/12  1023  -- 3550   Wound / Incision (Open or Dehisced) 12/15/21 Irritant Dermatitis (Moisture Associated Skin Damage);(ITD) Intertriginous Dermatitis Groin Anterior;Posterior;Mid;Right;Left 12/15/21  1800  Groin  239   Wound / Incision  (Open or Dehisced) 12/15/21 Other (Comment);Irritant Dermatitis (Moisture Associated Skin Damage) Pretibial Right 12/15/21  1900  Pretibial  239            Intake/Output Last 24 hours No intake or output data in the 24 hours ending 08/11/22 1644  Labs/Imaging Results for orders placed or performed during the hospital encounter of 08/11/22 (from the past 48 hour(s))  Urinalysis, w/ Reflex to Culture (Infection Suspected) -Urine, Clean Catch     Status: Abnormal   Collection Time: 08/11/22  1:10 PM  Result Value Ref Range   Specimen Source URINE, CLEAN CATCH    Color, Urine STRAW (A) YELLOW   APPearance CLEAR CLEAR   Specific Gravity, Urine 1.004 (L) 1.005 - 1.030   pH 7.0 5.0 - 8.0   Glucose, UA NEGATIVE NEGATIVE mg/dL   Hgb urine dipstick NEGATIVE NEGATIVE   Bilirubin Urine NEGATIVE NEGATIVE   Ketones, ur NEGATIVE NEGATIVE mg/dL   Protein, ur NEGATIVE NEGATIVE mg/dL   Nitrite NEGATIVE NEGATIVE   Leukocytes,Ua NEGATIVE NEGATIVE   RBC / HPF 0-5 0 - 5 RBC/hpf   WBC, UA 0-5 0 - 5 WBC/hpf    Comment:        Reflex urine culture not performed if WBC <=10, OR if Squamous epithelial cells >5. If Squamous epithelial cells >5 suggest recollection.    Bacteria, UA NONE SEEN NONE SEEN   Squamous Epithelial / HPF 0-5 0 - 5 /HPF    Comment: Performed at [facility], [address] [place], [place] [address]  CBC with Differential     Status: Abnormal   Collection Time: 08/11/22  1:25 PM  Result Value Ref Range   WBC 6.1 4.0 - 10.5 K/uL   RBC 4.20 (L) 4.22 - 5.81 MIL/uL   Hemoglobin 12.4 (L) 13.0 - 17.0 g/dL   HCT 60.4 (L) 54.0 - 98.1 %   MCV 90.7 80.0 - 100.0 fL   MCH 29.5 26.0 - 34.0 pg   MCHC 32.5 30.0 - 36.0 g/dL   RDW 19.1 47.8 - 29.5 %   Platelets 233 150 - 400 K/uL   nRBC 0.0 0.0 - 0.2 %   Neutrophils Relative % 76 %   Neutro Abs 4.6 1.7 - 7.7 K/uL   Lymphocytes Relative 16 %   Lymphs Abs 1.0 0.7 - 4.0 K/uL   Monocytes Relative 6 %   Monocytes Absolute 0.4 0.1  - 1.0 K/uL   Eosinophils Relative 1 %   Eosinophils Absolute 0.0 0.0 - 0.5 K/uL   Basophils Relative 0 %   Basophils Absolute 0.0 0.0 - 0.1 K/uL   Immature Granulocytes 1 %   Abs Immature Granulocytes 0.03 0.00 - 0.07 K/uL    Comment: Performed at [facility], [address] [place], [place] [address]  Comprehensive metabolic panel     Status: Abnormal   Collection Time: 08/11/22  1:25 PM  Result Value Ref Range   Sodium 138 135 - 145 mmol/L   Potassium 3.7 3.5 - 5.1 mmol/L   Chloride 107 98 - 111 mmol/L  CO2 23 22 - 32 mmol/L   Glucose, Bld 127 (H) 70 - 99 mg/dL    Comment: Glucose reference range applies only to samples taken after fasting for at least 8 hours.   BUN 21 8 - 23 mg/dL   Creatinine, Ser 1.61 0.61 - 1.24 mg/dL   Calcium 09.6 (H) 8.9 - 10.3 mg/dL   Total Protein 6.4 (L) 6.5 - 8.1 g/dL   Albumin 3.3 (L) 3.5 - 5.0 g/dL   AST 17 15 - 41 U/L   ALT 14 0 - 44 U/L   Alkaline Phosphatase 71 38 - 126 U/L   Total Bilirubin 0.4 0.3 - 1.2 mg/dL   GFR, Estimated >04 >54 mL/min    Comment: (NOTE) Calculated using the CKD-EPI Creatinine Equation (2021)    Anion gap 8 5 - 15    Comment: Performed at [facility], [address], [place], [place] [address]  Magnesium     Status: None   Collection Time: 08/11/22  1:25 PM  Result Value Ref Range   Magnesium 1.9 1.7 - 2.4 mg/dL    Comment: Performed at [facility], [address] [place], [place] [address]   CT Head Wo Contrast  Result Date: 08/11/2022 CLINICAL DATA:  Vertigo, central EXAM: CT HEAD WITHOUT CONTRAST TECHNIQUE: Contiguous axial images were obtained from the base of the skull through the vertex without intravenous contrast. RADIATION DOSE REDUCTION: This exam was performed according to the departmental dose-optimization program which includes automated exposure control, adjustment of the mA and/or kV according to patient size and/or use of iterative reconstruction technique. COMPARISON:  None  Available. FINDINGS: Brain: No evidence of acute infarction, hemorrhage, hydrocephalus, extra-axial collection or mass lesion/mass effect. There is sequela of mild chronic microvascular ischemic change. Vascular: No hyperdense vessel or unexpected calcification. Skull: Normal. Negative for fracture or focal lesion. Sinuses/Orbits: No middle ear or mastoid effusion. Paranasal sinuses are clear. Orbits are unremarkable. Other: None. IMPRESSION: No acute intracranial abnormality. No CT finding to explain vertigo. Electronically Signed   By: Lorenza Cambridge M.D.   On: 08/11/2022 14:42   DG Chest 1 View  Result Date: 08/11/2022 CLINICAL DATA:  Chest pain EXAM: CHEST  1 VIEW COMPARISON:  X-ray 12/14/2021 and CT scan FINDINGS: Underinflation. Enlarged cardiopericardial silhouette with vascular congestion centrally. No pneumothorax, effusion or consolidation. Eventration of the right hemidiaphragm. Film is under penetrated and rotated to the left as well as lordotic. IMPRESSION: Enlarged cardiopericardial silhouette with vascular congestion. Underinflation. Limited x-ray Electronically Signed   By: Karen Kays M.D.   On: 08/11/2022 14:40    Pending Labs Unresulted Labs (From admission, onward)     Start     Ordered   08/12/22 0500  Comprehensive metabolic panel  Tomorrow morning,   R        08/11/22 1600   08/12/22 0500  CBC  Tomorrow morning,   R        08/11/22 1600   08/11/22 1620  Protime-INR  ONCE - STAT,   STAT        08/11/22 1619            Vitals/Pain Today's Vitals   08/11/22 1207 08/11/22 1208 08/11/22 1510  BP: (!) 154/91  (!) 190/100  Pulse: 82  71  Resp: 20  18  Temp: (!) 97.4 F (36.3 C)    TempSrc: Oral    SpO2: 96%  100%  Weight:  112 kg   Height:  5\' 10"  (  1.778 m)   PainSc:  0-No pain     Isolation Precautions No active isolations  Medications Medications  Bempedoic Acid-Ezetimibe 180-10 MG TABS 1 tablet (has no administration in time range)  carvedilol (COREG)  tablet 12.5 mg (12.5 mg Oral Given 08/11/22 1639)  cloNIDine (CATAPRES) tablet 0.2 mg (has no administration in time range)  doxazosin (CARDURA) tablet 4 mg (has no administration in time range)  hydrochlorothiazide (MICROZIDE) capsule 12.5 mg (has no administration in time range)  losartan (COZAAR) tablet 100 mg (has no administration in time range)  sodium chloride flush (NS) 0.9 % injection 3 mL (has no administration in time range)  acetaminophen (TYLENOL) tablet 650 mg (has no administration in time range)    Or  acetaminophen (TYLENOL) suppository 650 mg (has no administration in time range)  polyethylene glycol (MIRALAX / GLYCOLAX) packet 17 g (has no administration in time range)  sodium chloride 0.9 % bolus 1,000 mL (0 mLs Intravenous Stopped 08/11/22 1624)  meclizine (ANTIVERT) tablet 12.5 mg (12.5 mg Oral Given 08/11/22 1334)  metoCLOPramide (REGLAN) injection 5 mg (5 mg Intravenous Given 08/11/22 1335)  diphenhydrAMINE (BENADRYL) injection 12.5 mg (12.5 mg Intravenous Given 08/11/22 1335)    Mobility Uses walker, but unable to ambulate d/t c/o dizziness.     Focused Assessments Cardiac Assessment Handoff:    Lab Results  Component Value Date   CKTOTAL 44 (L) 12/15/2021   No results found for: "DDIMER" Does the Patient currently have chest pain? No    R Recommendations: See Admitting Provider Note  Report given to:   Additional Notes:

## 2022-08-11 NOTE — H&P (Signed)
History and Physical   [name] ZOX:[id number] DOB: 23-Oct-1950 DOA: 08/11/2022  PCP: [name], MD   Patient coming from: Home  Chief Complaint: Dizziness  HPI: [name] is a 72 y.o. male with medical history significant of atrial fibrillation, OSA, obesity, hypertension, hyperlipidemia, aortic stenosis, mitral valve regurgitation presenting with dizziness.  Patient states that he was getting up to go to the bathroom overnight (which is common for him) without issues. He was okay when he got up in the morning and was able to make breakfast.  After breakfast he went to go to the bathroom again. When attempting to stand from the toilet the room was spinning so significantly he did not feel he could stand.  He wears a button that we will contact emergency services and he pressed this and the fire department came to help him.  They were able to help him up and noted that his blood pressure was in the 200s systolic.  He was brought to the ED for further evaluation.  States he had his yearly checkup yesterday and his blood pressure and labs were reportedly all okay/normal.  He denies any associated nausea, vomiting, headache, neck pain, chest pain, shortness of breath.  He further denies any fevers, chills, abdominal pain, constipation, diarrhea.  ED Course: Vital signs in the ED notable for blood pressure in the 150s to 190s systolic.  Lab workup included CMP with glucose 127, calcium 10.6, protein 6.4, albumin 3.3.  CBC with hemoglobin stable at 12.4.  PT and INR pending.  Magnesium normal.  Chest x-ray showed enlarged heart with vascular congestion.  CT head showed no acute abnormality.  MR brain pending.  Patient received meclizine, Reglan, Benadryl, liter of IV fluids in the ED.  Patient attempted to walk in the ED but remained unable to stand without significant vertigo.  Review of Systems: As per HPI otherwise all other systems reviewed and are negative.  Past Medical History:   Diagnosis Date   Adenomatous colon polyp 07/2003   Heart valve problem    Dr [name]   Hypertension    Mitral valve disorders(424.0)    Nonrheumatic aortic valve stenosis 01/14/2021   Echo 9/22: EF 60-65, small pericardial effusion, mild AS (mean gradient 16 mmHg), trivial AI, mild MR (MR is eccentric - may be underestimated) // Echo 10/2021: EF 60-65, mod calcification of AV, trivial AI, mod-severe AS (DI 0.25, V-max 350 cm/s, mean 28.8), no RWMA, NL RVSF, severe LAE, mild RAE, small effusion w/o tamponade, ?mild-moderate MR (difficult due to eccentric jet)   Nonrheumatic mitral valve regurgitation 01/14/2021   Echo 10/2021: EF 60-65, moderate calcification of the aortic valve, trivial AI, moderate to severe aortic stenosis (DI 0.25, V-max 350 cm/s, mean gradient 28.8), no RWMA, normal RVSF, severe LAE, mild RAE, small pericardial effusion without tamponade, suspect mild-moderate MR (difficult to qualify degree due to eccentric jet with coanda affect)   Obesity    OSA (obstructive sleep apnea)    cpap   Other and unspecified hyperlipidemia    Permanent atrial fibrillation (HCC)    Zio Monitor 5/22: AFib - Avg HR 55; slowest 32; longest pause 3.9 sec; occ PVCs, 4 beat NSVT   Sleep apnea     Past Surgical History:  Procedure Laterality Date   COLONOSCOPY  06/09/2015   per Dr. Russella Dar, adenomatous polyp, repeat in 7 yrs   FOOT SURGERY  at age 33   LUMBAR LAMINECTOMY/DECOMPRESSION MICRODISCECTOMY N/A 11/21/2012   Procedure: Lumbar  Three to Lumbar Five Laminectomy;  Surgeon: Karn Cassis, MD;  Location: [facility];  Service: Neurosurgery;  Laterality: N/A;  Lumbar Three to Lumbar Five Laminectomy   RIGHT/LEFT HEART CATH AND CORONARY ANGIOGRAPHY N/A 12/17/2021   Procedure: RIGHT/LEFT HEART CATH AND CORONARY ANGIOGRAPHY;  Surgeon: Marykay Lex, MD;  Location: [facility] INVASIVE CV LAB;  Service: Cardiovascular;  Laterality: N/A;    Social History  reports that he has never smoked. He has  never used smokeless tobacco. He reports that he does not drink alcohol and does not use drugs.  Allergies  Allergen Reactions   Statins Other (See Comments)    "Muscles were messed up"    Penicillins Other (See Comments)    Childhood reaction. Unknown per Pt, cannot remember ever trying beta lactams     Family History  Problem Relation Age of Onset   Alzheimer's disease Mother    Stroke Father    Heart disease Father    Hypertension Father    Alzheimer's disease Maternal Aunt        x 3   Colon cancer Neg Hx    Hypercalcemia Neg Hx   Reviewed on admission  Prior to Admission medications   Medication Sig Start Date End Date Taking? Authorizing Provider  acetaminophen (TYLENOL) 325 MG tablet Take 650 mg by mouth daily as needed (pain).    [provider]  Bempedoic Acid-Ezetimibe (NEXLIZET) 180-10 MG TABS Take 1 tablet by mouth daily. (Stop taking ezetimibe when you start taking Nexlizet) 02/17/22   Pricilla Riffle, MD  carvedilol (COREG) 12.5 MG tablet Take 1 tablet (12.5 mg total) by mouth 2 (two) times daily with a meal. 03/14/22   Pricilla Riffle, MD  cloNIDine (CATAPRES) 0.2 MG tablet Take 1 tablet (0.2 mg total) by mouth 2 (two) times daily. 08/10/22   [name], MD  doxazosin (CARDURA) 4 MG tablet Take 1 tablet (4 mg total) by mouth daily. 08/10/22   [name], MD  hydrochlorothiazide (MICROZIDE) 12.5 MG capsule Take 1 capsule (12.5 mg total) by mouth daily. 08/10/22   [name], MD  losartan (COZAAR) 100 MG tablet Take 1 tablet (100 mg total) by mouth daily. 08/10/22   [name], MD  lubiprostone (AMITIZA) 24 MCG capsule Take 1 capsule (24 mcg total) by mouth 2 (two) times daily. 08/10/22   [name], MD  OMEGA-3 FATTY ACIDS PO Take 1 capsule by mouth in the morning and at bedtime.    [provider]  warfarin (COUMADIN) 5 MG tablet Take 1.5 tablets (7.5 mg total) by mouth daily. 01/07/22   Pricilla Riffle, MD    Physical Exam: Vitals:   08/11/22  1207 08/11/22 1208 08/11/22 1510  BP: (!) 154/91  (!) 190/100  Pulse: 82  71  Resp: 20  18  Temp: (!) 97.4 F (36.3 C)    TempSrc: Oral    SpO2: 96%  100%  Weight:  112 kg   Height:  5\' 10"  (1.778 m)     Physical Exam Constitutional:      General: He is not in acute distress.    Appearance: Normal appearance.  HENT:     Head: Normocephalic and atraumatic.     Mouth/Throat:     Mouth: Mucous membranes are moist.     Pharynx: Oropharynx is clear.  Eyes:     Extraocular Movements: Extraocular movements intact.     Pupils: Pupils are equal, round, and reactive to  light.  Cardiovascular:     Rate and Rhythm: Normal rate and regular rhythm.     Pulses: Normal pulses.     Heart sounds: Normal heart sounds.  Pulmonary:     Effort: Pulmonary effort is normal. No respiratory distress.     Breath sounds: Normal breath sounds.  Abdominal:     General: Bowel sounds are normal. There is no distension.     Palpations: Abdomen is soft.     Tenderness: There is no abdominal tenderness.  Musculoskeletal:        General: No swelling or deformity.  Skin:    General: Skin is warm and dry.  Neurological:     General: No focal deficit present.     Mental Status: Mental status is at baseline.    Labs on Admission: I have personally reviewed following labs and imaging studies  CBC: Recent Labs  Lab 08/10/22 0838 08/11/22 1325  WBC 6.7 6.1  NEUTROABS 4.6 4.6  HGB 12.1* 12.4*  HCT 36.4* 38.1*  MCV 90.3 90.7  PLT 248.0 233    Basic Metabolic Panel: Recent Labs  Lab 08/10/22 0838 08/11/22 1325  NA 140 138  K 4.4 3.7  CL 105 107  CO2 23 23  GLUCOSE 111* 127*  BUN 26* 21  CREATININE 0.93 0.93  CALCIUM 10.8* 10.6*  MG  --  1.9    GFR: Estimated Creatinine Clearance: 90 mL/min (by C-G formula based on SCr of 0.93 mg/dL).  Liver Function Tests: Recent Labs  Lab 08/10/22 0838 08/11/22 1325  AST 17 17  ALT 11 14  ALKPHOS 71 71  BILITOT 0.6 0.4  PROT 6.6 6.4*   ALBUMIN 3.9 3.3*    Urine analysis:    Component Value Date/Time   COLORURINE STRAW (A) 08/11/2022 1310   APPEARANCEUR CLEAR 08/11/2022 1310   LABSPEC 1.004 (L) 08/11/2022 1310   PHURINE 7.0 08/11/2022 1310   GLUCOSEU NEGATIVE 08/11/2022 1310   GLUCOSEU NEGATIVE 06/29/2020 0959   HGBUR NEGATIVE 08/11/2022 1310   HGBUR trace-lysed 11/02/2009 0834   BILIRUBINUR NEGATIVE 08/11/2022 1310   BILIRUBINUR neg 07/10/2020 0917   KETONESUR NEGATIVE 08/11/2022 1310   PROTEINUR NEGATIVE 08/11/2022 1310   UROBILINOGEN 0.2 07/10/2020 0917   UROBILINOGEN 0.2 06/29/2020 0959   NITRITE NEGATIVE 08/11/2022 1310   LEUKOCYTESUR NEGATIVE 08/11/2022 1310    Radiological Exams on Admission: CT Head Wo Contrast  Result Date: 08/11/2022 CLINICAL DATA:  Vertigo, central EXAM: CT HEAD WITHOUT CONTRAST TECHNIQUE: Contiguous axial images were obtained from the base of the skull through the vertex without intravenous contrast. RADIATION DOSE REDUCTION: This exam was performed according to the departmental dose-optimization program which includes automated exposure control, adjustment of the mA and/or kV according to patient size and/or use of iterative reconstruction technique. COMPARISON:  None Available. FINDINGS: Brain: No evidence of acute infarction, hemorrhage, hydrocephalus, extra-axial collection or mass lesion/mass effect. There is sequela of mild chronic microvascular ischemic change. Vascular: No hyperdense vessel or unexpected calcification. Skull: Normal. Negative for fracture or focal lesion. Sinuses/Orbits: No middle ear or mastoid effusion. Paranasal sinuses are clear. Orbits are unremarkable. Other: None. IMPRESSION: No acute intracranial abnormality. No CT finding to explain vertigo. Electronically Signed   By: Lorenza Cambridge M.D.   On: 08/11/2022 14:42   DG Chest 1 View  Result Date: 08/11/2022 CLINICAL DATA:  Chest pain EXAM: CHEST  1 VIEW COMPARISON:  X-ray 12/14/2021 and CT scan FINDINGS:  Underinflation. Enlarged cardiopericardial silhouette with vascular congestion centrally. No pneumothorax, effusion  or consolidation. Eventration of the right hemidiaphragm. Film is under penetrated and rotated to the left as well as lordotic. IMPRESSION: Enlarged cardiopericardial silhouette with vascular congestion. Underinflation. Limited x-ray Electronically Signed   By: Karen Kays M.D.   On: 08/11/2022 14:40    EKG: Independently reviewed.  Atrial fibrillation at about 60 bpm.  Right bundle blanch block.  Nonspecific T wave changes.  Morphology similar to previous but rate slower.  Assessment/Plan Principal Problem:   Vertigo Active Problems:   Dyslipidemia   OBESITY   OBSTRUCTIVE SLEEP APNEA   Permanent atrial fibrillation (HCC)   Nonrheumatic mitral valve regurgitation   Nonrheumatic aortic valve stenosis   Vertigo > Patient with sudden onset of vertigo when he went to get up to go to the bathroom earlier this evening. > Continues to experience vertigo in the ED despite meclizine, Reglan, Benadryl, IV fluids. > Described as room spinning sensation, possibly peripheral vertigo if otolith had become dislodged in his sleep. > MRI ordered in the ED to rule out central etiology/posterior circulation CVA. > Remains unable to stand in the ED. > Low suspicion for aortic stenosis as etiology as it is vertigo and not lightheadedness/syncopal symptoms - Monitor on telemetry overnight - Follow-up MRI, stroke workup if positive - Vestibular PT - Supportive Care  Hypertensive urgency > Known history of hypertension on multiple antihypertensives. > Lab workup benign.  Did have blood pressure in the 200s for the fire department and as high as 190s in the ED. > CT head without acute abnormality. - Continue home carvedilol, clonidine, hydrochlorothiazide, losartan, doxazosin - As needed labetalol for BP>180 systolic  Atrial fibrillation - Continue home carvedilol - Continue warfarin per  pharmacy  OSA - Continue home CPAP  Hyperlipidemia - Continue home Nexlizet  Aortic stenosis Mitral valve regurgitation > Recent echo and October 2023 with trivial mitral valve regurgitation and moderate aortic valve stenosis.  Significant murmur present on exam. - Continue to monitor  DVT prophylaxis: Warfarin Code Status:   Full Family Communication:  None on admission  Disposition Plan:   Patient is from:  Home  Anticipated DC to:  Home  Anticipated DC date:  1 to 2 days  Anticipated DC barriers: None  Consults called:  None, will need neurology if MRI positive Admission status:  Observation, telemetry  Severity of Illness: The appropriate patient status for this patient is OBSERVATION. Observation status is judged to be reasonable and necessary in order to provide the required intensity of service to ensure the patient's safety. The patient's presenting symptoms, physical exam findings, and initial radiographic and laboratory data in the context of their medical condition is felt to place them at decreased risk for further clinical deterioration. Furthermore, it is anticipated that the patient will be medically stable for discharge from the hospital within 2 midnights of admission.    Synetta Fail MD [facility]  How to contact the [facility] Attending or Consulting provider 7A - 7P or covering provider during after hours 7P -7A, for this patient?   Check the care team in [facility] and look for a) attending/consulting [facility] provider listed and b) the [facility] team listed Log into [email or web address] and use [facility]'s universal password to access. If you do not have the password, please contact the hospital operator. Locate the [facility] provider you are looking for under [facility] and page to a number that you can be directly reached. If you still have difficulty reaching the provider, please page the [facility] (Director on  Call) for the Hospitalists listed on amion for  assistance.  08/11/2022, 4:10 PM

## 2022-08-11 NOTE — ED Notes (Signed)
Pt unable to ambulate to bathroom d/t c/o dizziness. Pt request bedpan for bowel movement. Pt placed on bedpan, call bell in reach with instructions to press red button for this nurse. Son present outside room door.

## 2022-08-11 NOTE — ED Triage Notes (Signed)
Pt present to ED from home with c/o hypertension. Pt denies chest pain, shortness of breath at this time. States normal blood pressure result at PCP yesterday.

## 2022-08-11 NOTE — ED Provider Notes (Signed)
[name] EMERGENCY DEPARTMENT AT [facility] Provider Note   CSN: [id number] Arrival date & time: 08/11/22  1205     History  Chief Complaint  Patient presents with   Hypertension    [name] is a 72 y.o. male.  72 yo M with a cc of dizziness.  The patient states that he gets up to the bathroom frequently in the middle the night to use the restroom.  He had gotten up to go to the bathroom and then when he tried to stand up realized that he could not.  He tried to get up again and was unable to.  He felt like the world was spinning.  He denies any nausea or vomiting.  Denies headache or neck pain.  Denies chest pain or difficulty breathing.  He pushed his home alarm and the fire department showed up and helped him off the toilet.  He still feels dizzy now.  They checked his blood pressure on the scene which is what he is most worried about it was over 200 systolic.  He denies any chest pain or difficulty breathing.  Denies any recent medication changes.  Denies cough congestion or fever.   Hypertension       Home Medications Prior to Admission medications   Medication Sig Start Date End Date Taking? Authorizing Provider  acetaminophen (TYLENOL) 325 MG tablet Take 650 mg by mouth daily as needed (pain).    [provider]  Bempedoic Acid-Ezetimibe (NEXLIZET) 180-10 MG TABS Take 1 tablet by mouth daily. (Stop taking ezetimibe when you start taking Nexlizet) 02/17/22   Blake Riffle, MD  carvedilol (COREG) 12.5 MG tablet Take 1 tablet (12.5 mg total) by mouth 2 (two) times daily with a meal. 03/14/22   Blake Riffle, MD  cloNIDine (CATAPRES) 0.2 MG tablet Take 1 tablet (0.2 mg total) by mouth 2 (two) times daily. 08/10/22   Blake Salisbury, MD  doxazosin (CARDURA) 4 MG tablet Take 1 tablet (4 mg total) by mouth daily. 08/10/22   Blake Salisbury, MD  hydrochlorothiazide (MICROZIDE) 12.5 MG capsule Take 1 capsule (12.5 mg total) by mouth daily. 08/10/22   Blake Salisbury, MD   losartan (COZAAR) 100 MG tablet Take 1 tablet (100 mg total) by mouth daily. 08/10/22   Blake Salisbury, MD  lubiprostone (AMITIZA) 24 MCG capsule Take 1 capsule (24 mcg total) by mouth 2 (two) times daily. 08/10/22   Blake Salisbury, MD  OMEGA-3 FATTY ACIDS PO Take 1 capsule by mouth in the morning and at bedtime.    [provider]  warfarin (COUMADIN) 5 MG tablet Take 1.5 tablets (7.5 mg total) by mouth daily. 01/07/22   Blake Riffle, MD      Allergies    Statins and Penicillins    Review of Systems   Review of Systems  Physical Exam Updated Vital Signs BP (!) 190/100 (BP Location: Left Arm)   Pulse 71   Temp (!) 97.4 F (36.3 C) (Oral)   Resp 18   Ht 5\' 10"  (1.778 m)   Wt 112 kg   SpO2 100%   BMI 35.43 kg/m  Physical Exam Vitals and nursing note reviewed.  Constitutional:      Appearance: He is well-developed.  HENT:     Head: Normocephalic and atraumatic.  Eyes:     Pupils: Pupils are equal, round, and reactive to light.  Neck:     Vascular: No JVD.  Cardiovascular:  Rate and Rhythm: Normal rate and regular rhythm.     Heart sounds: No murmur heard.    No friction rub. No gallop.  Pulmonary:     Effort: No respiratory distress.     Breath sounds: No wheezing.  Abdominal:     General: There is no distension.     Tenderness: There is no abdominal tenderness. There is no guarding or rebound.  Musculoskeletal:        General: Normal range of motion.     Cervical back: Normal range of motion and neck supple.  Skin:    Coloration: Skin is not pale.     Findings: No rash.  Neurological:     Mental Status: He is alert and oriented to person, place, and time.     GCS: GCS eye subscore is 4. GCS verbal subscore is 5. GCS motor subscore is 6.     Cranial Nerves: Cranial nerves 2-12 are intact.     Sensory: Sensation is intact.     Motor: Motor function is intact.     Coordination: Coordination is intact.  Psychiatric:        Behavior: Behavior normal.      ED Results / Procedures / Treatments   Labs (all labs ordered are listed, but only abnormal results are displayed) Labs Reviewed  CBC WITH DIFFERENTIAL/PLATELET - Abnormal; Notable for the following components:      Result Value   RBC 4.20 (*)    Hemoglobin 12.4 (*)    HCT 38.1 (*)    All other components within normal limits  COMPREHENSIVE METABOLIC PANEL - Abnormal; Notable for the following components:   Glucose, Bld 127 (*)    Calcium 10.6 (*)    Total Protein 6.4 (*)    Albumin 3.3 (*)    All other components within normal limits  URINALYSIS, W/ REFLEX TO CULTURE (INFECTION SUSPECTED) - Abnormal; Notable for the following components:   Color, Urine STRAW (*)    Specific Gravity, Urine 1.004 (*)    All other components within normal limits  MAGNESIUM  PROTIME-INR    EKG EKG Interpretation  Date/Time:  Thursday August 11 2022 14:49:57 EDT Ventricular Rate:  62 PR Interval:    QRS Duration: 134 QT Interval:  398 QTC Calculation: 403 R Axis:   -58 Text Interpretation: Atrial fibrillation Left axis deviation Right bundle branch block Minimal voltage criteria for LVH, may be normal variant ( R in aVL ) Inferior infarct , age undetermined Anterolateral infarct , age undetermined Abnormal ECG No significant change since last tracing Confirmed by Melene Plan [phone]) on 08/11/2022 3:05:36 PM  Radiology CT Head Wo Contrast  Result Date: 08/11/2022 CLINICAL DATA:  Vertigo, central EXAM: CT HEAD WITHOUT CONTRAST TECHNIQUE: Contiguous axial images were obtained from the base of the skull through the vertex without intravenous contrast. RADIATION DOSE REDUCTION: This exam was performed according to the departmental dose-optimization program which includes automated exposure control, adjustment of the mA and/or kV according to patient size and/or use of iterative reconstruction technique. COMPARISON:  None Available. FINDINGS: Brain: No evidence of acute infarction, hemorrhage,  hydrocephalus, extra-axial collection or mass lesion/mass effect. There is sequela of mild chronic microvascular ischemic change. Vascular: No hyperdense vessel or unexpected calcification. Skull: Normal. Negative for fracture or focal lesion. Sinuses/Orbits: No middle ear or mastoid effusion. Paranasal sinuses are clear. Orbits are unremarkable. Other: None. IMPRESSION: No acute intracranial abnormality. No CT finding to explain vertigo. Electronically Signed   By: Lorenza Cambridge  M.D.   On: 08/11/2022 14:42   DG Chest 1 View  Result Date: 08/11/2022 CLINICAL DATA:  Chest pain EXAM: CHEST  1 VIEW COMPARISON:  X-ray 12/14/2021 and CT scan FINDINGS: Underinflation. Enlarged cardiopericardial silhouette with vascular congestion centrally. No pneumothorax, effusion or consolidation. Eventration of the right hemidiaphragm. Film is under penetrated and rotated to the left as well as lordotic. IMPRESSION: Enlarged cardiopericardial silhouette with vascular congestion. Underinflation. Limited x-ray Electronically Signed   By: Karen Kays M.D.   On: 08/11/2022 14:40    Procedures Procedures    Medications Ordered in ED Medications  sodium chloride 0.9 % bolus 1,000 mL (1,000 mLs Intravenous Bolus 08/11/22 1332)  meclizine (ANTIVERT) tablet 12.5 mg (12.5 mg Oral Given 08/11/22 1334)  metoCLOPramide (REGLAN) injection 5 mg (5 mg Intravenous Given 08/11/22 1335)  diphenhydrAMINE (BENADRYL) injection 12.5 mg (12.5 mg Intravenous Given 08/11/22 1335)    ED Course/ Medical Decision Making/ A&P                             Medical Decision Making Amount and/or Complexity of Data Reviewed Labs: ordered. Radiology: ordered. ECG/medicine tests: ordered.  Risk Prescription drug management.   72 yo M with  A chief complaint of dizziness.  This started suddenly after he was walking to get up to go to the bathroom.  Worse with head position changes especially with standing.  He was seen by his PCP yesterday and  reportedly had a very unremarkable visits and had good blood work.  He denies any recent medication changes.  Denies headache or neck pain.  He is most concerned about his high blood pressure.  This has come down quite a bit with EMS.  Will obtain a laboratory evaluation bolus of IV fluids.  CT of the head.  Treat dizziness with a headache cocktail meclizine.  Reassess.  Patient still too dizzy to stand up and ambulate. MRI brain.  Will discuss with medicine for admission.   The patients results and plan were reviewed and discussed.   Any x-rays performed were independently reviewed by myself.   Differential diagnosis were considered with the presenting HPI.  Medications  sodium chloride 0.9 % bolus 1,000 mL (1,000 mLs Intravenous Bolus 08/11/22 1332)  meclizine (ANTIVERT) tablet 12.5 mg (12.5 mg Oral Given 08/11/22 1334)  metoCLOPramide (REGLAN) injection 5 mg (5 mg Intravenous Given 08/11/22 1335)  diphenhydrAMINE (BENADRYL) injection 12.5 mg (12.5 mg Intravenous Given 08/11/22 1335)    Vitals:   08/11/22 1207 08/11/22 1208 08/11/22 1510  BP: (!) 154/91  (!) 190/100  Pulse: 82  71  Resp: 20  18  Temp: (!) 97.4 F (36.3 C)    TempSrc: Oral    SpO2: 96%  100%  Weight:  112 kg   Height:  5\' 10"  (1.778 m)     Final diagnoses:  Dizziness    Admission/ observation were discussed with the admitting physician, patient and/or family and they are comfortable with the plan.         Final Clinical Impression(s) / ED Diagnoses Final diagnoses:  Dizziness    Rx / DC Orders ED Discharge Orders     None         Melene Plan, DO 08/11/22 1531

## 2022-08-11 NOTE — ED Notes (Signed)
Unable to pt ambulate pt in [place] d/t c/o dizziness. Dr Adela Lank aware.

## 2022-08-11 NOTE — Progress Notes (Signed)
New Admission Note:  Arrival Method: By stretcher from ED around 2026 Mental Orientation: Alert and oriented x 4  Telemetry: Box 19, CCMD notified Assessment: Completed Skin: Completed, refer to flowsheets. Pt refused to have ted hose removed, denies wounds to BLE and feet.  IV: R A.C. S.L. Pain: Denies Tubes: Condom Cath-changed to primo once arrived  Safety Measures: Safety Fall Prevention Plan was given, discussed and signed. Admission: Completed [facility] Orientation: Patient has been orientated to the room, unit and the staff. Family: None at bedside  Orders have been reviewed and implemented. Will continue to monitor the patient. Call light has been placed within reach and bed alarm has been activated.   Franky Macho, RN  Phone Number: [phone]

## 2022-08-11 NOTE — Progress Notes (Signed)
   08/11/22 2312  BiPAP/CPAP/SIPAP  Reason BIPAP/CPAP not in use Non-compliant   Patient stated he used to wear a cpap in the past, but no longer wears one. He said he does not want to wear a CPAP and will let the RN know if he changes his mind.

## 2022-08-11 NOTE — Progress Notes (Addendum)
ANTICOAGULATION CONSULT NOTE - Initial Consult  Pharmacy Consult for warfarin (continuation of home medication) Indication: atrial fibrillation  Allergies  Allergen Reactions   Statins Other (See Comments)    "Muscles were messed up"    Penicillins Other (See Comments)    Childhood reaction. Unknown per Pt, cannot remember ever trying beta lactams     Vital Signs: Temp: 97.4 F (36.3 C) (06/06 1207) Temp Source: Oral (06/06 1207) BP: 186/96 (06/06 1701) Pulse Rate: 75 (06/06 1701)  Labs: Recent Labs    08/10/22 0838 08/11/22 1325 08/11/22 1630  HGB 12.1* 12.4*  --   HCT 36.4* 38.1*  --   PLT 248.0 233  --   LABPROT  --   --  30.6*  INR  --   --  2.9*  CREATININE 0.93 0.93  --     Estimated Creatinine Clearance: 90 mL/min (by C-G formula based on SCr of 0.93 mg/dL).   Medical History: Past Medical History:  Diagnosis Date   Adenomatous colon polyp 07/2003   Heart valve problem    Dr [name]   Hypertension    Mitral valve disorders(424.0)    Nonrheumatic aortic valve stenosis 01/14/2021   Echo 9/22: EF 60-65, small pericardial effusion, mild AS (mean gradient 16 mmHg), trivial AI, mild MR (MR is eccentric - may be underestimated) // Echo 10/2021: EF 60-65, mod calcification of AV, trivial AI, mod-severe AS (DI 0.25, V-max 350 cm/s, mean 28.8), no RWMA, NL RVSF, severe LAE, mild RAE, small effusion w/o tamponade, ?mild-moderate MR (difficult due to eccentric jet)   Nonrheumatic mitral valve regurgitation 01/14/2021   Echo 10/2021: EF 60-65, moderate calcification of the aortic valve, trivial AI, moderate to severe aortic stenosis (DI 0.25, V-max 350 cm/s, mean gradient 28.8), no RWMA, normal RVSF, severe LAE, mild RAE, small pericardial effusion without tamponade, suspect mild-moderate MR (difficult to qualify degree due to eccentric jet with coanda affect)   Obesity    OSA (obstructive sleep apnea)    cpap   Other and unspecified hyperlipidemia    Permanent atrial  fibrillation (HCC)    Zio Monitor 5/22: AFib - Avg HR 55; slowest 32; longest pause 3.9 sec; occ PVCs, 4 beat NSVT   Sleep apnea     Medications:  (Not in a hospital admission)  Scheduled:   [START ON 08/12/2022] Bempedoic Acid-Ezetimibe  1 tablet Oral Daily   carvedilol  12.5 mg Oral BID WC   cloNIDine  0.2 mg Oral BID   [START ON 08/12/2022] doxazosin  4 mg Oral Daily   [START ON 08/12/2022] hydrochlorothiazide  12.5 mg Oral Daily   [START ON 08/12/2022] losartan  100 mg Oral Daily   sodium chloride flush  3 mL Intravenous Q12H    Assessment: 71 yoM presented to the ED with cc of dizziness. PMH includes afib that is managed by warfarin. Pharmacy consulted to continue dosing warfarin while inpatient. PTA regimen is warfarin 7.5 mg PO MWF and 5 mg PO all other days with last INR documented at 2.9 on 07/13/2022. INR today 2.9. Patient is within goal and daily monitoring not needed for INR and can push to 3x/weekly. No signs/symptoms of bleeding reported and CBC shows plts WNL and Hgb 12.1  Goal of Therapy:  INR 2-3 Monitor platelets by anticoagulation protocol: Yes   Plan:  Warfarin 7.5 mg PO every MWF and 5 mg PO all other days Monitor CBC daily Monitor INR 3x weekly Monitor for signs/symptoms of bleeding  Thanks, Arabella Merles,  PharmD. [facility] Acute Care PGY-1 08/11/2022 5:44 PM

## 2022-08-12 DIAGNOSIS — I10 Essential (primary) hypertension: Secondary | ICD-10-CM | POA: Diagnosis not present

## 2022-08-12 DIAGNOSIS — I350 Nonrheumatic aortic (valve) stenosis: Secondary | ICD-10-CM | POA: Diagnosis not present

## 2022-08-12 DIAGNOSIS — I4821 Permanent atrial fibrillation: Secondary | ICD-10-CM | POA: Diagnosis not present

## 2022-08-12 DIAGNOSIS — R42 Dizziness and giddiness: Secondary | ICD-10-CM | POA: Diagnosis not present

## 2022-08-12 LAB — CBC
HCT: 36.4 % — ABNORMAL LOW (ref 39.0–52.0)
Hemoglobin: 12.4 g/dL — ABNORMAL LOW (ref 13.0–17.0)
MCH: 30.8 pg (ref 26.0–34.0)
MCHC: 34.1 g/dL (ref 30.0–36.0)
MCV: 90.3 fL (ref 80.0–100.0)
Platelets: 217 10*3/uL (ref 150–400)
RBC: 4.03 MIL/uL — ABNORMAL LOW (ref 4.22–5.81)
RDW: 13.4 % (ref 11.5–15.5)
WBC: 7.9 10*3/uL (ref 4.0–10.5)
nRBC: 0 % (ref 0.0–0.2)

## 2022-08-12 LAB — COMPREHENSIVE METABOLIC PANEL
ALT: 13 U/L (ref 0–44)
AST: 17 U/L (ref 15–41)
Albumin: 2.9 g/dL — ABNORMAL LOW (ref 3.5–5.0)
Alkaline Phosphatase: 59 U/L (ref 38–126)
Anion gap: 9 (ref 5–15)
BUN: 17 mg/dL (ref 8–23)
CO2: 22 mmol/L (ref 22–32)
Calcium: 10.2 mg/dL (ref 8.9–10.3)
Chloride: 105 mmol/L (ref 98–111)
Creatinine, Ser: 0.87 mg/dL (ref 0.61–1.24)
GFR, Estimated: >60 mL/min (ref >60)
Glucose, Bld: 114 mg/dL — ABNORMAL HIGH (ref 70–99)
Potassium: 3.6 mmol/L (ref 3.5–5.1)
Sodium: 136 mmol/L (ref 135–145)
Total Bilirubin: 0.9 mg/dL (ref 0.3–1.2)
Total Protein: 5.9 g/dL — ABNORMAL LOW (ref 6.5–8.1)

## 2022-08-12 MED ORDER — SENNA 8.6 MG PO TABS
1.0000 | ORAL_TABLET | Freq: Every day | ORAL | Status: DC | PRN
  Administered 2022-08-12: 8.6 mg via ORAL
  Filled 2022-08-12: qty 1

## 2022-08-12 NOTE — Progress Notes (Signed)
Pt refusing to wear CPAP. States he used to wear it, but not anymore.   [name]

## 2022-08-12 NOTE — TOC Progression Note (Signed)
Transition of Care [facility]) - Progression Note    Patient Details  Name: [name] MRN: [id number] Date of Birth: 1950-06-12  [facility]) CM/SW Contact  Tom-Johnson, Hershal Coria, RN Phone Number: 08/12/2022, 3:56 PM  Clinical Narrative:     CM informed by LCSW that patient declined SNF and requesting Home Health. Patient was active with [facility] 01/26/22 and would like to use their services again.  Referral called in to [facility] and Amy voiced acceptance, info on AVS. CM will continue to follow as patient progresses with care towards discharge.       Expected Discharge Plan: Home w Home Health Services Barriers to Discharge: Continued Medical Work up  Expected Discharge Plan and Services                                               Social Determinants of Health (SDOH) Interventions SDOH Screenings   Food Insecurity: No Food Insecurity (06/14/2022)  Housing: Low Risk  (06/14/2022)  Transportation Needs: No Transportation Needs (03/21/2022)  Utilities: Not At Risk (03/21/2022)  Alcohol Screen: Low Risk  (03/21/2022)  Depression (PHQ2-9): Low Risk  (08/10/2022)  Financial Resource Strain: Low Risk  (03/21/2022)  Physical Activity: Insufficiently Active (03/21/2022)  Social Connections: Socially Isolated (03/21/2022)  Stress: No Stress Concern Present (03/21/2022)  Tobacco Use: Low Risk  (08/11/2022)    Readmission Risk Interventions     No data to display

## 2022-08-12 NOTE — Care Management Obs Status (Signed)
MEDICARE OBSERVATION STATUS NOTIFICATION   Patient Details  Name: [name] MRN: [id number] Date of Birth: 1950-09-18   Medicare Observation Status Notification Given:  Yes    Tom-Johnson, Hershal Coria, RN 08/12/2022, 3:52 PM

## 2022-08-12 NOTE — Evaluation (Signed)
Physical Therapy Evaluation and Vestibular Assessment Patient Details Name: [name] MRN: [id number] DOB: [birth year] Today's Date: 08/12/2022  History of Present Illness  Pt is a 72 y/o male who presents with vertigo, prompting pt to call for emergency services. MRI head negative, BP in ED elevated. PMH significant for back pain, HTN, heart valve disorder, a-fib, lumbar laminectomy/decompression 2014.   Clinical Impression  Pt admitted with above diagnosis. Pt currently with functional limitations due to the deficits listed below (see PT Problem List). At the time of PT eval pt was able to perform limited mobility due to back pain. He tolerated a set of orthostatic vital signs but unable to tolerate 3 minute standing reading. Pt required +2 assist for transition to/from EOB and to stand. Pt typically lives alone and stays in his lift chair. He does not get in the bed and sponge bathes at baseline. Caregiver, nephew Theodoro Grist, intermittently assists and takes pt to his appts. Based on performance today, feel pt would benefit from continued skilled therapy <3 hours/day to return to PLOF. Back and pain was the biggest limiting factor for mobility and pt attributes this to the bed. Pt will benefit from acute skilled PT to increase their independence and safety with mobility to allow discharge.      Vestibular Assessment - 08/12/22 0001       Vestibular Assessment   General Observation Pt appears comfortable, lying in bed.      Symptom Behavior   Subjective history of current problem Pt reports vertigo suddenly began 08/11/22.    Type of Dizziness  Vertigo    Symptom Nature Variable    Aggravating Factors Supine to sit    Relieving Factors Rest;Closing eyes    Progression of Symptoms Better   Received Meclizine in ED     Positional Testing   Dix-Hallpike Dix-Hallpike Right;Dix-Hallpike Left    Horizontal Canal Testing Horizontal Canal Right;Horizontal Canal Left      Dix-Hallpike Right    Dix-Hallpike Right Duration None    Dix-Hallpike Right Symptoms No nystagmus      Dix-Hallpike Left   Dix-Hallpike Left Duration None    Dix-Hallpike Left Symptoms No nystagmus      Horizontal Canal Right   Horizontal Canal Right Duration None    Horizontal Canal Right Symptoms Normal      Horizontal Canal Left   Horizontal Canal Left Duration None    Horizontal Canal Left Symptoms Normal      Cognition   Cognition Orientation Level Oriented x 4      Positional Sensitivities   Supine to Sitting Moderate dizziness      Orthostatics   BP supine (x 5 minutes) 166/100    HR supine (x 5 minutes) 80    BP sitting 152/84    HR sitting 68    BP standing (after 1 minute) 145/104    HR standing (after 1 minute) 95    Orthostatics Comment Pt asymptomatic throughout orthostatic testing           Pt reported 1 instance of vertigo, when transitioning from sidelying to sit. No nystagmus noted throughout session. No other bouts of vertigo reported. Cervical ROM limiting some testing but grossly horizontal and posterior canals were clear. Due to patient's decreased tolerance for activity and increased back pain by end of session, oculomotor exam unable to be completed.      Recommendations for follow up therapy are one component of a multi-disciplinary discharge planning process, led by the attending  physician.  Recommendations may be updated based on patient status, additional functional criteria and insurance authorization.  Follow Up Recommendations Can patient physically be transported by private vehicle: No     Assistance Recommended at Discharge Frequent or constant Supervision/Assistance  Patient can return home with the following  A little help with walking and/or transfers;A little help with bathing/dressing/bathroom;Assistance with cooking/housework;Assist for transportation;Help with stairs or ramp for entrance    Equipment Recommendations None recommended by PT   Recommendations for Other Services       Functional Status Assessment Patient has had a recent decline in their functional status and demonstrates the ability to make significant improvements in function in a reasonable and predictable amount of time.     Precautions / Restrictions Precautions Precautions: Fall Precaution Comments: Vertigo Restrictions Weight Bearing Restrictions: No      Mobility  Bed Mobility Overal bed mobility: Needs Assistance Bed Mobility: Rolling, Sidelying to Sit, Sit to Sidelying Rolling: Mod assist Sidelying to sit: Max assist, +2 for physical assistance     Sit to sidelying: Max assist, +2 for safety/equipment General bed mobility comments: +2 assist required for rolling and to/from sitting EOB. Pt appeared to be limited by back pain. He did report 1 bout of vertigo with sidelying to sit, however no other reports of vertigo throughout session.    Transfers Overall transfer level: Needs assistance Equipment used: Rolling walker (2 wheels) Transfers: Sit to/from Stand Sit to Stand: Mod assist, From elevated surface, +2 safety/equipment           General transfer comment: VC's for hand placement on seated surface for safety but pt pulling to stand from walker turned backwards. he states he typically does this at home. Mod assist for power up but pt unable to maintain upright posture.    Ambulation/Gait               General Gait Details: Unable to progress  Information systems manager Rankin (Stroke Patients Only)       Balance Overall balance assessment: Needs assistance Sitting-balance support: Bilateral upper extremity supported, Feet supported Sitting balance-Leahy Scale: Poor Sitting balance - Comments: Pt required UE support for sitting due to back pain.   Standing balance support: Bilateral upper extremity supported, Reliant on assistive device for balance, During functional activity Standing  balance-Leahy Scale: Poor                               Pertinent Vitals/Pain Pain Assessment Pain Assessment: Faces Faces Pain Scale: Hurts whole lot Pain Location: Back Pain Intervention(s): Limited activity within patient's tolerance, Monitored during session, Repositioned    Home Living Family/patient expects to be discharged to:: Private residence Living Arrangements: Alone Available Help at Discharge: Family;Available 24 hours/day Type of Home: Apartment Home Access: Level entry       Home Layout: One level Home Equipment: Agricultural consultant (2 wheels)      Prior Function Prior Level of Function : Independent/Modified Independent             Mobility Comments: pt amb around the home with RW ADLs Comments: Pt reports independent with light IADLs and ADLs; does sponge baths; has groceries delievered; nephew checks on him frequently and assists with community mobility. Pt typically sleeps in a recliner.     Hand Dominance   Dominant Hand: Right  Extremity/Trunk Assessment   Upper Extremity Assessment Upper Extremity Assessment: Defer to OT evaluation    Lower Extremity Assessment Lower Extremity Assessment: Generalized weakness    Cervical / Trunk Assessment Cervical / Trunk Assessment: Other exceptions Cervical / Trunk Exceptions: Very flexed trunk; cervical tightness  Communication   Communication: No difficulties  Cognition Arousal/Alertness: Awake/alert Behavior During Therapy: WFL for tasks assessed/performed Overall Cognitive Status: Within Functional Limits for tasks assessed                                          General Comments      Exercises     Assessment/Plan    PT Assessment Patient needs continued PT services  PT Problem List Decreased strength;Decreased range of motion;Decreased activity tolerance;Decreased balance;Decreased mobility;Decreased knowledge of use of DME;Decreased safety  awareness;Decreased knowledge of precautions;Pain       PT Treatment Interventions DME instruction;Gait training;Functional mobility training;Therapeutic activities;Therapeutic exercise;Balance training;Patient/family education    PT Goals (Current goals can be found in the Care Plan section)  Acute Rehab PT Goals Patient Stated Goal: Return to his home PT Goal Formulation: With patient/family Time For Goal Achievement: 08/19/22 Potential to Achieve Goals: Good    Frequency Min 3X/week     Co-evaluation               AM-PAC PT "6 Clicks" Mobility  Outcome Measure Help needed turning from your back to your side while in a flat bed without using bedrails?: A Lot Help needed moving from lying on your back to sitting on the side of a flat bed without using bedrails?: Total Help needed moving to and from a bed to a chair (including a wheelchair)?: Total Help needed standing up from a chair using your arms (e.g., wheelchair or bedside chair)?: A Lot Help needed to walk in hospital room?: Total Help needed climbing 3-5 steps with a railing? : Total 6 Click Score: 8    End of Session Equipment Utilized During Treatment: Gait belt Activity Tolerance: Patient tolerated treatment well Patient left: in chair;with call bell/phone within reach Nurse Communication: Mobility status PT Visit Diagnosis: Unsteadiness on feet (R26.81);Dizziness and giddiness (R42);Pain;Muscle weakness (generalized) (M62.81) Pain - part of body:  (back)    Time: 1610-9604 PT Time Calculation (min) (ACUTE ONLY): 43 min   Charges:   PT Evaluation $PT Eval Moderate Complexity: 1 Mod PT Treatments $Therapeutic Activity: 23-37 mins        Conni Slipper, PT, DPT Acute Rehabilitation Services Secure Chat Preferred Office: [phone]   Marylynn Pearson 08/12/2022, 1:15 PM

## 2022-08-12 NOTE — Progress Notes (Signed)
Pt refused, says he does not wear a cpap machine.

## 2022-08-12 NOTE — TOC Initial Note (Signed)
Transition of Care [facility]) - Initial/Assessment Note    Patient Details  Name: [name] MRN: [id number] Date of Birth: 10/17/1950  Transition of Care [facility]) CM/SW Contact:    Erin Sons, LCSW Phone Number: 08/12/2022, 1:47 PM  Clinical Narrative:                  Spoke with patient on room phone regarding SNF rec. Pt does not want to go to SNF and feels his dizziness will improve enough tomorrow to return home. He lives home alone though his nephew is near by and helps out some. CSW also provided update to nephew on phone. Nephew states pt has had HH PT in the past and feels pt would do well at home with [facility]. They do not remember which agency he was previously arranged with. RNCM notified.   Expected Discharge Plan: Home w Home Health Services Barriers to Discharge: Continued Medical Work up   Patient Goals and CMS Choice            Expected Discharge Plan and Services                                              Prior Living Arrangements/Services     Patient language and need for interpreter reviewed:: Yes        Need for Family Participation in Patient Care: No (Comment) Care giver support system in place?: Yes (comment)   Criminal Activity/Legal Involvement Pertinent to Current Situation/Hospitalization: No - Comment as needed  Activities of Daily Living      Permission Sought/Granted   Permission granted to share information with : Yes, Verbal Permission Granted  Share Information with NAME: Lydia Guiles [facility])  [phone] (Home Phone)           Emotional Assessment              Admission diagnosis:  Dizziness [R42] Vertigo [R42] Patient Active Problem List   Diagnosis Date Noted   Vertigo 08/11/2022   Non-ST elevation (NSTEMI) myocardial infarction [facility])    Elevated troponin 12/15/2021   Cellulitis of abdominal wall 12/15/2021   Generalized weakness 12/15/2021   Stasis ulcer (HCC) 07/15/2021   Nonrheumatic mitral  valve regurgitation 01/14/2021   Nonrheumatic aortic valve stenosis 01/14/2021   Bradycardia 01/14/2021   Hypercalcemia 09/26/2020   Permanent atrial fibrillation (HCC)    Prediabetes 07/21/2016   Myalgia and myositis 10/17/2013   Right hip pain 10/17/2013   Unspecified vitamin D deficiency 10/11/2013   Encounter for therapeutic drug monitoring 06/03/2013   OTHER CONSTIPATION 01/20/2010   PERSONAL HX COLONIC POLYPS 01/20/2010   OBSTRUCTIVE SLEEP APNEA 11/17/2008   ATRIAL FIBRILLATION 08/18/2008   Dyslipidemia 06/18/2007   OBESITY 06/18/2007   INTERNAL HEMORRHOIDS 06/18/2007   EXTERNAL HEMORRHOIDS 06/18/2007   Resistant hypertension 09/04/2006   MITRAL REGURGITATION, MODERATE 09/04/2006   RENAL CALCULUS, HX OF 09/04/2006   ADENOMATOUS COLONIC POLYP 07/29/2003   PCP:  [name], MD Pharmacy:   [facility] [id number] [place], [place] - [address] [place] [address] Phone: [phone] Fax: [phone]     Social Determinants of Health (SDOH) Social History: SDOH Screenings   Food Insecurity: No Food Insecurity (06/14/2022)  Housing: Low Risk  (06/14/2022)  Transportation Needs: No Transportation Needs (03/21/2022)  Utilities: Not At Risk (03/21/2022)  Alcohol Screen:  Low Risk  (03/21/2022)  Depression (PHQ2-9): Low Risk  (08/10/2022)  Financial Resource Strain: Low Risk  (03/21/2022)  Physical Activity: Insufficiently Active (03/21/2022)  Social Connections: Socially Isolated (03/21/2022)  Stress: No Stress Concern Present (03/21/2022)  Tobacco Use: Low Risk  (08/11/2022)   SDOH Interventions:     Readmission Risk Interventions     No data to display

## 2022-08-12 NOTE — Hospital Course (Signed)
[name] is a 72 y.o. male with a history of atrial fibrillation, obstructive sleep apnea, obesity, hypertension, hyperlipidemia, aortic stenosis, mitral valve dictation.  Patient presented secondary to dizziness and also found to have significant hypertension.  Concern for possible vertigo.  PT consulted for evaluation.  Antihypertensives restarted.

## 2022-08-12 NOTE — Progress Notes (Signed)
PROGRESS NOTE    [name]  ZOX:[id number] DOB: Sep 30, 1950 DOA: 08/11/2022 PCP: [name], MD   Brief Narrative: [name] is a 72 y.o. male with a history of atrial fibrillation, obstructive sleep apnea, obesity, hypertension, hyperlipidemia, aortic stenosis, mitral valve dictation.  Patient presented secondary to dizziness and also found to have significant hypertension.  Concern for possible vertigo.  PT consulted for evaluation.  Antihypertensives restarted.   Assessment and Plan:  Dizziness Initial concern for possible vertigo.  Patient's history is not completely consistent with vertigo versus possible lightheadedness.  No nystagmus noted on exam.  Possibly related to significant hypertension but seems somewhat unlikely.  Vestibular PT consulted for evaluation.  MRI brain negative for acute stroke versus other process. -PT recommendations -Orthostatic vitals  Severe asymptomatic hypertension No evidence of endorgan damage.  Possibly related to unintentional adherence.  Home medications resumed. -Continue Coreg, clonidine, doxazosin, hydrochlorothiazide, losartan  Pulmonary atrial fibrillation -Continue Coreg and Coumadin  Hyperlipidemia -Continue Nexlizet  Aortic stenosis Moderate on recent echo.  Noted  Obstructive sleep apnea Patient declined CPAP machine.  Obesity Estimated body mass index is 35.43 kg/m as calculated from the following:   Height as of this encounter: 5\' 10"  (1.778 m).   Weight as of this encounter: 112 kg.   DVT prophylaxis: Coumadin Code Status:   Code Status: Full Code Family Communication: Nephew  Disposition Plan: Discharge likely to SNF when bed available   Consultants:  None  Procedures:  None  Antimicrobials: None    Subjective: Patient reports no dizziness but he has not been up out of bed since yesterday.  No headache.  No chest pain.  Objective: BP (!) 147/93   Pulse 67   Temp 98.4 F (36.9 C) (Oral)    Resp 16   Ht 5\' 10"  (1.778 m)   Wt 112 kg   SpO2 98%   BMI 35.43 kg/m   Examination:  General exam: Appears calm and comfortable Respiratory system: Clear to auscultation. Respiratory effort normal. Cardiovascular system: S1 & S2 heard Gastrointestinal system: Abdomen is nondistended, soft and nontender. Normal bowel sounds heard. Central nervous system: Alert and oriented. No focal neurological deficits.  No nystagmus noted. Musculoskeletal:  No calf tenderness Skin: No cyanosis. No rashes Psychiatry: Judgement and insight appear normal. Mood & affect appropriate.    Data Reviewed: I have personally reviewed following labs and imaging studies  CBC Lab Results  Component Value Date   WBC 7.9 08/12/2022   RBC 4.03 (L) 08/12/2022   HGB 12.4 (L) 08/12/2022   HCT 36.4 (L) 08/12/2022   MCV 90.3 08/12/2022   MCH 30.8 08/12/2022   PLT 217 08/12/2022   MCHC 34.1 08/12/2022   RDW 13.4 08/12/2022   LYMPHSABS 1.0 08/11/2022   MONOABS 0.4 08/11/2022   EOSABS 0.0 08/11/2022   BASOSABS 0.0 08/11/2022     Last metabolic panel Lab Results  Component Value Date   NA 136 08/12/2022   K 3.6 08/12/2022   CL 105 08/12/2022   CO2 22 08/12/2022   BUN 17 08/12/2022   CREATININE 0.87 08/12/2022   GLUCOSE 114 (H) 08/12/2022   GFRNONAA >60 08/12/2022   GFRAA 72 04/17/2019   CALCIUM 10.2 08/12/2022   PROT 5.9 (L) 08/12/2022   ALBUMIN 2.9 (L) 08/12/2022   LABGLOB 2.3 01/07/2022   AGRATIO 1.7 01/07/2022   BILITOT 0.9 08/12/2022   ALKPHOS 59 08/12/2022   AST 17 08/12/2022   ALT 13 08/12/2022   ANIONGAP 9  08/12/2022    GFR: Estimated Creatinine Clearance: 96.2 mL/min (by C-G formula based on SCr of 0.87 mg/dL).  No results found for this or any previous visit (from the past 240 hour(s)).    Radiology Studies: MR BRAIN WO CONTRAST  Result Date: 08/11/2022 CLINICAL DATA:  Neuro deficit, acute, stroke suspected EXAM: MRI HEAD WITHOUT CONTRAST TECHNIQUE: Multiplanar, multiecho  pulse sequences of the brain and surrounding structures were obtained without intravenous contrast. COMPARISON:  Same day CT Head FINDINGS: Brain: No acute infarction, hemorrhage, hydrocephalus, extra-axial collection or mass lesion. Sequela of moderate chronic microvascular ischemic change. Vascular: Normal flow voids. Skull and upper cervical spine: Normal marrow signal. Sinuses/Orbits: Negative. Other: Likely small subcutaneous cyst in the suboccipital scalp. IMPRESSION: Negative for an acute infarct. Electronically Signed   By: Lorenza Cambridge M.D.   On: 08/11/2022 18:43   CT Head Wo Contrast  Result Date: 08/11/2022 CLINICAL DATA:  Vertigo, central EXAM: CT HEAD WITHOUT CONTRAST TECHNIQUE: Contiguous axial images were obtained from the base of the skull through the vertex without intravenous contrast. RADIATION DOSE REDUCTION: This exam was performed according to the departmental dose-optimization program which includes automated exposure control, adjustment of the mA and/or kV according to patient size and/or use of iterative reconstruction technique. COMPARISON:  None Available. FINDINGS: Brain: No evidence of acute infarction, hemorrhage, hydrocephalus, extra-axial collection or mass lesion/mass effect. There is sequela of mild chronic microvascular ischemic change. Vascular: No hyperdense vessel or unexpected calcification. Skull: Normal. Negative for fracture or focal lesion. Sinuses/Orbits: No middle ear or mastoid effusion. Paranasal sinuses are clear. Orbits are unremarkable. Other: None. IMPRESSION: No acute intracranial abnormality. No CT finding to explain vertigo. Electronically Signed   By: Lorenza Cambridge M.D.   On: 08/11/2022 14:42   DG Chest 1 View  Result Date: 08/11/2022 CLINICAL DATA:  Chest pain EXAM: CHEST  1 VIEW COMPARISON:  X-ray 12/14/2021 and CT scan FINDINGS: Underinflation. Enlarged cardiopericardial silhouette with vascular congestion centrally. No pneumothorax, effusion or  consolidation. Eventration of the right hemidiaphragm. Film is under penetrated and rotated to the left as well as lordotic. IMPRESSION: Enlarged cardiopericardial silhouette with vascular congestion. Underinflation. Limited x-ray Electronically Signed   By: Karen Kays M.D.   On: 08/11/2022 14:40      LOS: 0 days    Jacquelin Hawking, MD [facility] 08/12/2022, 1:47 PM   If 7PM-7AM, please contact night-coverage [email or web address]

## 2022-08-12 NOTE — Progress Notes (Signed)
Pt having pauses off and on per telemetry. Pt is resting comfortably in bed asleep right now. RN messaged MD on call to make aware.   [name] [name]

## 2022-08-12 NOTE — Progress Notes (Signed)
   08/12/22 2255  BiPAP/CPAP/SIPAP  Reason BIPAP/CPAP not in use Non-compliant (Pt says he does not wear a cpap. On room air, no distress noted.)  FiO2 (%) 21 %

## 2022-08-13 DIAGNOSIS — I10 Essential (primary) hypertension: Secondary | ICD-10-CM | POA: Diagnosis not present

## 2022-08-13 DIAGNOSIS — I4821 Permanent atrial fibrillation: Secondary | ICD-10-CM | POA: Diagnosis not present

## 2022-08-13 DIAGNOSIS — I350 Nonrheumatic aortic (valve) stenosis: Secondary | ICD-10-CM | POA: Diagnosis not present

## 2022-08-13 DIAGNOSIS — R42 Dizziness and giddiness: Secondary | ICD-10-CM | POA: Diagnosis not present

## 2022-08-13 LAB — PROTIME-INR
INR: 3.9 — ABNORMAL HIGH (ref 0.8–1.2)
Prothrombin Time: 38.4 seconds — ABNORMAL HIGH (ref 11.4–15.2)

## 2022-08-13 MED ORDER — POLYETHYLENE GLYCOL 3350 17 G PO PACK
17.0000 g | PACK | Freq: Two times a day (BID) | ORAL | Status: DC
  Administered 2022-08-13 – 2022-08-14 (×3): 17 g via ORAL
  Filled 2022-08-13 (×3): qty 1

## 2022-08-13 MED ORDER — SENNOSIDES-DOCUSATE SODIUM 8.6-50 MG PO TABS
2.0000 | ORAL_TABLET | Freq: Two times a day (BID) | ORAL | Status: DC
  Administered 2022-08-13 – 2022-08-14 (×3): 2 via ORAL
  Filled 2022-08-13 (×3): qty 2

## 2022-08-13 NOTE — Progress Notes (Signed)
ANTICOAGULATION CONSULT NOTE  Pharmacy Consult for warfarin (continuation of home medication) Indication: atrial fibrillation  Allergies  Allergen Reactions   Statins Other (See Comments)    "Muscles were messed up"    Penicillins Other (See Comments)    Childhood reaction. Unknown per Pt, cannot remember ever trying beta lactams     Vital Signs: Temp: 97.8 F (36.6 C) (06/08 0519) Temp Source: Oral (06/08 0519) BP: 159/135 (06/08 0834) Pulse Rate: 103 (06/08 0834)  Labs: Recent Labs    08/11/22 1325 08/11/22 1630 08/12/22 0358 08/13/22 0731  HGB 12.4*  --  12.4*  --   HCT 38.1*  --  36.4*  --   PLT 233  --  217  --   LABPROT  --  30.6*  --  38.4*  INR  --  2.9*  --  3.9*  CREATININE 0.93  --  0.87  --      Estimated Creatinine Clearance: 96.2 mL/min (by C-G formula based on SCr of 0.87 mg/dL).  Assessment: 49 yoM presented to the ED with cc of dizziness. PMH includes afib that is managed by warfarin. Pharmacy consulted to continue dosing warfarin while inpatient. PTA regimen is warfarin 7.5 mg PO MWF and 5 mg PO all other days with last INR documented at 2.9 on 07/13/2022. INR on admission 2.9.  INR up to 3.9 today  Goal of Therapy:  INR 2-3 Monitor platelets by anticoagulation protocol: Yes   Plan:  Hold warfarin today INR to daily  Thank you Okey Regal, PharmD 08/13/2022 8:55 AM

## 2022-08-13 NOTE — Progress Notes (Signed)
Pt keeps wanting to get on the bed pan over and over and getting upset with staff that he is not able to stay on it long d/t redness on buttocks. Pt is only passing gas. Pt had senna and is refusing Miralax. Pt states he deals with chronic constipation and has not found anything that really works for him. Pt states that he sits on the toilet at home "for hours" before having a BM. RN told pt that it would also help to get OOB to the bathroom or BSC if dizziness has resolved today to get moving around. Pt stated he would like to try to do that this morning after breakfast.   Devonne Doughty [facility]

## 2022-08-13 NOTE — Progress Notes (Signed)
   08/13/22 2200  BiPAP/CPAP/SIPAP  BiPAP/CPAP/SIPAP Pt Type Adult  Reason BIPAP/CPAP not in use Non-compliant  BiPAP/CPAP /SiPAP Vitals  SpO2 99 %

## 2022-08-13 NOTE — Progress Notes (Signed)
PROGRESS NOTE    [name]  WUJ:[id number] DOB: 22-Mar-1950 DOA: 08/11/2022 PCP: [name], MD   Brief Narrative: [name] is a 72 y.o. male with a history of atrial fibrillation, obstructive sleep apnea, obesity, hypertension, hyperlipidemia, aortic stenosis, mitral valve dictation.  Patient presented secondary to dizziness and also found to have significant hypertension.  Concern for possible vertigo.  PT consulted for evaluation.  Antihypertensives restarted.   Assessment and Plan:  Dizziness Initial concern for possible vertigo.  Patient's history is not completely consistent with vertigo versus possible lightheadedness.  No nystagmus noted on exam.  Possibly related to significant hypertension but seems somewhat unlikely.  Vestibular PT consulted for evaluation.  MRI brain negative for acute stroke versus other process. Orthostatic vitals improved. Dizziness resolved. -PT recommendations: home health  Severe asymptomatic hypertension No evidence of endorgan damage.  Possibly related to unintentional adherence.  Home medications resumed. -Continue Coreg, clonidine, doxazosin, hydrochlorothiazide, losartan -Cardiology consult per family request  Pulmonary atrial fibrillation -Continue Coreg and Coumadin  Hyperlipidemia -Continue Nexlizet  Aortic stenosis Moderate on recent echo.  Noted  Obstructive sleep apnea Patient declined CPAP machine.  Obesity Estimated body mass index is 35.43 kg/m as calculated from the following:   Height as of this encounter: 5\' 10"  (1.778 m).   Weight as of this encounter: 112 kg.   DVT prophylaxis: Coumadin Code Status:   Code Status: Full Code Family Communication: Nephew  Disposition Plan: Discharge home likely in 24 hours   Consultants:  None  Procedures:  None  Antimicrobials: None    Subjective: Dizziness resolved. Significantly weak with ambulation. Concerned about persistent hypertension.  Objective: BP  (!) 150/107 (BP Location: Right Arm)   Pulse 80   Temp 98 F (36.7 C) (Oral)   Resp 18   Ht 5\' 10"  (1.778 m)   Wt 112 kg   SpO2 99%   BMI 35.43 kg/m   Examination:  General exam: Appears calm and comfortable Respiratory system: Clear to auscultation. Respiratory effort normal. Cardiovascular system: S1 & S2 heard, RRR. Gastrointestinal system: Abdomen is nondistended, soft and nontender. Normal bowel sounds heard. Central nervous system: Alert and oriented. No focal neurological deficits. Musculoskeletal: No calf tenderness Skin: No cyanosis. No rashes Psychiatry: Judgement and insight appear normal. Mood & affect appropriate.    Data Reviewed: I have personally reviewed following labs and imaging studies  CBC Lab Results  Component Value Date   WBC 7.9 08/12/2022   RBC 4.03 (L) 08/12/2022   HGB 12.4 (L) 08/12/2022   HCT 36.4 (L) 08/12/2022   MCV 90.3 08/12/2022   MCH 30.8 08/12/2022   PLT 217 08/12/2022   MCHC 34.1 08/12/2022   RDW 13.4 08/12/2022   LYMPHSABS 1.0 08/11/2022   MONOABS 0.4 08/11/2022   EOSABS 0.0 08/11/2022   BASOSABS 0.0 08/11/2022     Last metabolic panel Lab Results  Component Value Date   NA 136 08/12/2022   K 3.6 08/12/2022   CL 105 08/12/2022   CO2 22 08/12/2022   BUN 17 08/12/2022   CREATININE 0.87 08/12/2022   GLUCOSE 114 (H) 08/12/2022   GFRNONAA >60 08/12/2022   GFRAA 72 04/17/2019   CALCIUM 10.2 08/12/2022   PROT 5.9 (L) 08/12/2022   ALBUMIN 2.9 (L) 08/12/2022   LABGLOB 2.3 01/07/2022   AGRATIO 1.7 01/07/2022   BILITOT 0.9 08/12/2022   ALKPHOS 59 08/12/2022   AST 17 08/12/2022   ALT 13 08/12/2022   ANIONGAP 9 08/12/2022  GFR: Estimated Creatinine Clearance: 96.2 mL/min (by C-G formula based on SCr of 0.87 mg/dL).  No results found for this or any previous visit (from the past 240 hour(s)).    Radiology Studies: MR BRAIN WO CONTRAST  Result Date: 08/11/2022 CLINICAL DATA:  Neuro deficit, acute, stroke suspected  EXAM: MRI HEAD WITHOUT CONTRAST TECHNIQUE: Multiplanar, multiecho pulse sequences of the brain and surrounding structures were obtained without intravenous contrast. COMPARISON:  Same day CT Head FINDINGS: Brain: No acute infarction, hemorrhage, hydrocephalus, extra-axial collection or mass lesion. Sequela of moderate chronic microvascular ischemic change. Vascular: Normal flow voids. Skull and upper cervical spine: Normal marrow signal. Sinuses/Orbits: Negative. Other: Likely small subcutaneous cyst in the suboccipital scalp. IMPRESSION: Negative for an acute infarct. Electronically Signed   By: Lorenza Cambridge M.D.   On: 08/11/2022 18:43      LOS: 0 days    Jacquelin Hawking, MD [facility] 08/13/2022, 4:54 PM   If 7PM-7AM, please contact night-coverage [email or web address]

## 2022-08-13 NOTE — Progress Notes (Signed)
Physical Therapy Treatment Patient Details Name: [name] MRN: [id number] DOB: [birth year] Today's Date: 08/13/2022   History of Present Illness Pt is a 72 y/o male who presents with vertigo, prompting pt to call for emergency services. MRI head negative, BP in ED elevated. PMH significant for back pain, atrial fibrillation, obstructive sleep apnea, obesity, hypertension, hyperlipidemia, aortic stenosis    PT Comments    Patient is in a difficult situation. He is only comfortable in his recliner at home and is currently limited by back pain (denies vertigo since episode at home 6/6). He sleeps in his recliner. He tried going to SNF previously, and he only got worse because of back pain. Eventually, family brought his recliner to SNF and he was finally able to get back pain under control and discharge home. He does not want to go to SNF, and per this history it does not seem to make sense. Able to assist pt to hospital recliner and he reports better back support than in the bed. He was not able to ambulate farther than bed to chair due to back pain and feeling unsteady. Anticipate he will progress over next day or so to be able to discharge home. Discussed possibility of medical transport home and nephew present and is not concerned with being able to take pt home by vehicle. Of note, pt again with high BP that dropped with orthostatics, although he denied dizziness.     08/13/22 0833  Orthostatic Lying   BP- Lying (!) 186/112  Pulse- Lying 89  Orthostatic Sitting  BP- Sitting (!) 186/101  Pulse- Sitting 91  Orthostatic Standing at 0 minutes  BP- Standing at 0 minutes (!) 176/94  Pulse- Standing at 0 minutes 61  *Unable to stand for 3 minute assessment. After seated in chair 159/136 HR 103.     Recommendations for follow up therapy are one component of a multi-disciplinary discharge planning process, led by the attending physician.  Recommendations may be updated based on patient status,  additional functional criteria and insurance authorization.  Follow Up Recommendations  Can patient physically be transported by private vehicle: Yes    Assistance Recommended at Discharge Frequent or constant Supervision/Assistance  Patient can return home with the following A little help with walking and/or transfers;A little help with bathing/dressing/bathroom;Assistance with cooking/housework;Assist for transportation   Equipment Recommendations  None recommended by PT    Recommendations for Other Services Other (comment) (Mobility Team)     Precautions / Restrictions Precautions Precautions: Fall Precaution Comments: Vertigo (none since 6/6) Restrictions Weight Bearing Restrictions: No     Mobility  Bed Mobility Overal bed mobility: Needs Assistance Bed Mobility: Rolling, Sidelying to Sit, Sit to Sidelying Rolling: Supervision Sidelying to sit: Min assist, HOB elevated       General bed mobility comments: no vertigo with rolling, elevating HOB and side to sit; pt using rail and pulled on PTs UE to come to full sit (he sleeps in recliner at home and does not do side to sit normally)    Transfers Overall transfer level: Needs assistance Equipment used: Rolling walker (2 wheels) Transfers: Sit to/from Stand, Bed to chair/wheelchair/BSC Sit to Stand: From elevated surface, Min guard   Step pivot transfers: Min guard, From elevated surface       General transfer comment: pt places his RW backwards and uses one hand on crossbar and one to push up from bed. Sit to stand twice during session. Recliner seat elevated with blankets and pillow  Ambulation/Gait               General Gait Details: Unable to progress to gait due to feeling unsteady and back pain   Stairs             Wheelchair Mobility    Modified Rankin (Stroke Patients Only)       Balance Overall balance assessment: Needs assistance Sitting-balance support: Feet supported Sitting  balance-Leahy Scale: Fair     Standing balance support: Bilateral upper extremity supported, Reliant on assistive device for balance, During functional activity Standing balance-Leahy Scale: Poor                              Cognition Arousal/Alertness: Awake/alert Behavior During Therapy: WFL for tasks assessed/performed Overall Cognitive Status: Within Functional Limits for tasks assessed                                          Exercises      General Comments General comments (skin integrity, edema, etc.): Denied vertigo since 6/6. No vertigo elicited with mobility, therefore vestibular evaluation deferred (also due to pt's body habitus and severe back pain, would be difficult to assess with Dix-Hallpike or supine head-roll)      Pertinent Vitals/Pain Pain Assessment Pain Assessment: 0-10 Pain Score: 5  Pain Location: Back Pain Descriptors / Indicators: Aching, Constant Pain Intervention(s): Limited activity within patient's tolerance, Monitored during session, Repositioned    Home Living                          Prior Function            PT Goals (current goals can now be found in the care plan section) Acute Rehab PT Goals Patient Stated Goal: Return to his home PT Goal Formulation: With patient/family Time For Goal Achievement: 08/19/22 Potential to Achieve Goals: Good Progress towards PT goals: Progressing toward goals    Frequency    Min 3X/week      PT Plan Discharge plan needs to be updated    Co-evaluation              AM-PAC PT "6 Clicks" Mobility   Outcome Measure  Help needed turning from your back to your side while in a flat bed without using bedrails?: A Little Help needed moving from lying on your back to sitting on the side of a flat bed without using bedrails?: Total Help needed moving to and from a bed to a chair (including a wheelchair)?: A Little Help needed standing up from a chair using  your arms (e.g., wheelchair or bedside chair)?: A Little Help needed to walk in hospital room?: Total Help needed climbing 3-5 steps with a railing? : Total 6 Click Score: 12    End of Session Equipment Utilized During Treatment: Gait belt Activity Tolerance: Patient tolerated treatment well Patient left: in chair;with call bell/phone within reach;with family/visitor present Nurse Communication: Mobility status;Other (comment) (uses RW backwards for sit to stand; normally sleeps in recliner at home) PT Visit Diagnosis: Unsteadiness on feet (R26.81);Dizziness and giddiness (R42);Pain;Muscle weakness (generalized) (M62.81) Pain - part of body:  (back)     Time: 7829-5621 PT Time Calculation (min) (ACUTE ONLY): 34 min  Charges:  $Gait Training: 8-22 mins $Therapeutic Activity: 8-22 mins  Jerolyn Center, PT Acute Rehabilitation Services  Office [phone]    Zena Amos 08/13/2022, 8:44 AM

## 2022-08-14 ENCOUNTER — Encounter (HOSPITAL_COMMUNITY): Payer: PPO

## 2022-08-14 DIAGNOSIS — I10 Essential (primary) hypertension: Secondary | ICD-10-CM | POA: Diagnosis not present

## 2022-08-14 DIAGNOSIS — I350 Nonrheumatic aortic (valve) stenosis: Secondary | ICD-10-CM | POA: Diagnosis not present

## 2022-08-14 DIAGNOSIS — I4821 Permanent atrial fibrillation: Secondary | ICD-10-CM | POA: Diagnosis not present

## 2022-08-14 DIAGNOSIS — R42 Dizziness and giddiness: Secondary | ICD-10-CM | POA: Diagnosis not present

## 2022-08-14 LAB — BASIC METABOLIC PANEL
Anion gap: 7 (ref 5–15)
BUN: 23 mg/dL (ref 8–23)
CO2: 22 mmol/L (ref 22–32)
Calcium: 10.6 mg/dL — ABNORMAL HIGH (ref 8.9–10.3)
Chloride: 104 mmol/L (ref 98–111)
Creatinine, Ser: 0.99 mg/dL (ref 0.61–1.24)
GFR, Estimated: >60 mL/min (ref >60)
Glucose, Bld: 118 mg/dL — ABNORMAL HIGH (ref 70–99)
Potassium: 3.4 mmol/L — ABNORMAL LOW (ref 3.5–5.1)
Sodium: 133 mmol/L — ABNORMAL LOW (ref 135–145)

## 2022-08-14 LAB — BRAIN NATRIURETIC PEPTIDE: B Natriuretic Peptide: 76.9 pg/mL (ref 0.0–100.0)

## 2022-08-14 LAB — PROTIME-INR
INR: 3.6 — ABNORMAL HIGH (ref 0.8–1.2)
Prothrombin Time: 36.3 seconds — ABNORMAL HIGH (ref 11.4–15.2)

## 2022-08-14 MED ORDER — AMLODIPINE BESYLATE 5 MG PO TABS
5.0000 mg | ORAL_TABLET | Freq: Every day | ORAL | Status: DC
  Administered 2022-08-14 – 2022-08-17 (×4): 5 mg via ORAL
  Filled 2022-08-14 (×4): qty 1

## 2022-08-14 MED ORDER — SENNOSIDES-DOCUSATE SODIUM 8.6-50 MG PO TABS: 2.0000 | ORAL_TABLET | Freq: Every evening | ORAL | Status: DC | PRN

## 2022-08-14 MED ORDER — POLYETHYLENE GLYCOL 3350 17 G PO PACK: 17.0000 g | PACK | Freq: Every day | ORAL | Status: DC | PRN

## 2022-08-14 MED ORDER — POTASSIUM CHLORIDE CRYS ER 20 MEQ PO TBCR
40.0000 meq | EXTENDED_RELEASE_TABLET | Freq: Once | ORAL | Status: AC
  Administered 2022-08-14: 40 meq via ORAL
  Filled 2022-08-14: qty 2

## 2022-08-14 NOTE — Plan of Care (Signed)
  Problem: Clinical Measurements: Goal: Diagnostic test results will improve Outcome: Progressing   

## 2022-08-14 NOTE — Plan of Care (Signed)
  Problem: Education: Goal: Knowledge of General Education information will improve Description: Including pain rating scale, medication(s)/side effects and non-pharmacologic comfort measures Outcome: Completed/Met   

## 2022-08-14 NOTE — Progress Notes (Signed)
ANTICOAGULATION CONSULT NOTE  Pharmacy Consult for warfarin (continuation of home medication) Indication: atrial fibrillation  Allergies  Allergen Reactions   Statins Other (See Comments)    "Muscles were messed up"    Penicillins Other (See Comments)    Childhood reaction. Unknown per Pt, cannot remember ever trying beta lactams     Vital Signs: Temp: 98.3 F (36.8 C) (06/09 0410) Temp Source: Oral (06/09 0410) BP: 170/92 (06/09 0410) Pulse Rate: 66 (06/09 0410)  Labs: Recent Labs    08/11/22 1325 08/11/22 1630 08/12/22 0358 08/13/22 0731 08/14/22 0200  HGB 12.4*  --  12.4*  --   --   HCT 38.1*  --  36.4*  --   --   PLT 233  --  217  --   --   LABPROT  --  30.6*  --  38.4* 36.3*  INR  --  2.9*  --  3.9* 3.6*  CREATININE 0.93  --  0.87  --  0.99     Estimated Creatinine Clearance: 84.5 mL/min (by C-G formula based on SCr of 0.99 mg/dL).  Assessment: 23 yoM presented to the ED with cc of dizziness. PMH includes afib that is managed by warfarin. Pharmacy consulted to continue dosing warfarin while inpatient. PTA regimen is warfarin 7.5 mg PO MWF and 5 mg PO all other days with last INR documented at 2.9 on 07/13/2022. INR on admission 2.9.  INR slowly trending down.  Goal of Therapy:  INR 2-3 Monitor platelets by anticoagulation protocol: Yes   Plan:  Hold warfarin today INR to daily  Thank you Okey Regal, PharmD 08/14/2022 8:59 AM

## 2022-08-14 NOTE — Consult Note (Signed)
Cardiology Consultation   Patient ID: [name] MRN: [id number]; DOB: 10/10/1950  Admit date: 08/11/2022 Date of Consult: 08/14/2022  PCP:  [name], MD   [facility] Providers Cardiologist:  [name], MD   {    Patient Profile:   [name] is a 72 y.o. male with a hx of permanent afib, HTN, bradycardia, OSA, morbid obesity, CAD medically managed from 12/2021 cath report below, mod aortic stenosis who is being seen 08/14/2022 for the evaluation of HTN at the request of Dr Caleb Popp.  History of Present Illness:   [name] 72 yo male history of permanent afib, HTN, bradycardia with prior pauses on monitor, OSA, morbid obesity, CAD medically managed from 12/2021 cath report below, mod aortic stenosis, admitted 08/11/22 with dizziness after standing. Called EMS, upon there evaluatoin SBP in the 200s and sent to ER. Diagnosed with vertigo being worked up by primary team, cardiology consulted for issues with significant HTN this admission.    WBC 6.1 Hgb 12.4 Plt 233 K 3.7 Cr 0.93 BUN 21 Mg 1.9 BNP 77 CXR cardiomegaly, vacsular congestion CT hea: no acute process MRI head no acute process EKG afib, bifascicular block RBBB/LAFB    12/2021 RHC/LHC: prox to distal LAD 55%, distal LCX 70%, OM2 occluded, OM1 99%, RCA small vessel occluded mid vessel RHC: mean PA 35, PCWP 22 CI 2.56 From cath note recs medical management, difficult anatomy for OM intervention reconsider if recurrent symptoms.   12/2021 echo: LVEF 55-60%, no WMAs, indet diastolic fxn, normal RV, trivial MR though limited visualization, mod AS mean grad 25, AVA VTI 1.5    Past Medical History:  Diagnosis Date   Adenomatous colon polyp 07/2003   Heart valve problem    Dr [name]   Hypertension    Mitral valve disorders(424.0)    Nonrheumatic aortic valve stenosis 01/14/2021   Echo 9/22: EF 60-65, small pericardial effusion, mild AS (mean gradient 16 mmHg), trivial AI, mild MR (MR is eccentric -  may be underestimated) // Echo 10/2021: EF 60-65, mod calcification of AV, trivial AI, mod-severe AS (DI 0.25, V-max 350 cm/s, mean 28.8), no RWMA, NL RVSF, severe LAE, mild RAE, small effusion w/o tamponade, ?mild-moderate MR (difficult due to eccentric jet)   Nonrheumatic mitral valve regurgitation 01/14/2021   Echo 10/2021: EF 60-65, moderate calcification of the aortic valve, trivial AI, moderate to severe aortic stenosis (DI 0.25, V-max 350 cm/s, mean gradient 28.8), no RWMA, normal RVSF, severe LAE, mild RAE, small pericardial effusion without tamponade, suspect mild-moderate MR (difficult to qualify degree due to eccentric jet with coanda affect)   Obesity    OSA (obstructive sleep apnea)    cpap   Other and unspecified hyperlipidemia    Permanent atrial fibrillation (HCC)    Zio Monitor 5/22: AFib - Avg HR 55; slowest 32; longest pause 3.9 sec; occ PVCs, 4 beat NSVT   Sleep apnea     Past Surgical History:  Procedure Laterality Date   COLONOSCOPY  06/09/2015   per Dr. Russella Dar, adenomatous polyp, repeat in 7 yrs   FOOT SURGERY  at age 54   LUMBAR LAMINECTOMY/DECOMPRESSION MICRODISCECTOMY N/A 11/21/2012   Procedure: Lumbar Three to Lumbar Five Laminectomy;  Surgeon: Karn Cassis, MD;  Location: [facility];  Service: Neurosurgery;  Laterality: N/A;  Lumbar Three to Lumbar Five Laminectomy   RIGHT/LEFT HEART CATH AND CORONARY ANGIOGRAPHY N/A 12/17/2021   Procedure: RIGHT/LEFT HEART CATH AND CORONARY ANGIOGRAPHY;  Surgeon: Herbie Baltimore,  Piedad Climes, MD;  Location: [facility];  Service: Cardiovascular;  Laterality: N/A;      Inpatient Medications: Scheduled Meds:  carvedilol  12.5 mg Oral BID WC   cloNIDine  0.2 mg Oral BID   doxazosin  4 mg Oral Daily   hydrochlorothiazide  12.5 mg Oral Daily   losartan  100 mg Oral Daily   lubiprostone  24 mcg Oral BID   polyethylene glycol  17 g Oral BID   potassium chloride  40 mEq Oral Once   senna-docusate  2 tablet Oral BID   sodium  chloride flush  3 mL Intravenous Q12H   Warfarin - Pharmacist Dosing Inpatient   Does not apply q1600   Continuous Infusions:  PRN Meds: acetaminophen **OR** acetaminophen, labetalol  Allergies:    Allergies  Allergen Reactions   Statins Other (See Comments)    "Muscles were messed up"    Penicillins Other (See Comments)    Childhood reaction. Unknown per Pt, cannot remember ever trying beta lactams     Social History:   Social History   Socioeconomic History   Marital status: Single    Spouse name: Not on file   Number of children: 0   Years of education: Not on file   Highest education level: Not on file  Occupational History   Occupation: retired  Tobacco Use   Smoking status: Never   Smokeless tobacco: Never  Vaping Use   Vaping Use: Never used  Substance and Sexual Activity   Alcohol use: No    Alcohol/week: 0.0 standard drinks of alcohol   Drug use: No   Sexual activity: Not on file  Other Topics Concern   Not on file  Social History Narrative   Not on file   Social Determinants of Health   Financial Resource Strain: Low Risk  (03/21/2022)   Overall Financial Resource Strain (CARDIA)    Difficulty of Paying Living Expenses: Not hard at all  Food Insecurity: No Food Insecurity (06/14/2022)   Hunger Vital Sign    Worried About Running Out of Food in the Last Year: Never true    Ran Out of Food in the Last Year: Never true  Transportation Needs: No Transportation Needs (03/21/2022)   PRAPARE - Administrator, Civil Service (Medical): No    Lack of Transportation (Non-Medical): No  Physical Activity: Insufficiently Active (03/21/2022)   Exercise Vital Sign    Days of Exercise per Week: 7 days    Minutes of Exercise per Session: 20 min  Stress: No Stress Concern Present (03/21/2022)   [employer] of Occupational Health - Occupational Stress Questionnaire    Feeling of Stress : Not at all  Social Connections: Socially Isolated (03/21/2022)    Social Connection and Isolation Panel [NHANES]    Frequency of Communication with Friends and Family: More than three times a week    Frequency of Social Gatherings with Friends and Family: More than three times a week    Attends Religious Services: Never    Database administrator or Organizations: No    Attends Banker Meetings: Never    Marital Status: Never married  Intimate Partner Violence: Not At Risk (03/21/2022)   Humiliation, Afraid, Rape, and Kick questionnaire    Fear of Current or Ex-Partner: No    Emotionally Abused: No    Physically Abused: No    Sexually Abused: No    Family History:    Family History  Problem  Relation Age of Onset   Alzheimer's disease Mother    Stroke Father    Heart disease Father    Hypertension Father    Alzheimer's disease Maternal Aunt        x 3   Colon cancer Neg Hx    Hypercalcemia Neg Hx      ROS:  Please see the history of present illness.   All other ROS reviewed and negative.     Physical Exam/Data:   Vitals:   08/13/22 1653 08/13/22 2036 08/13/22 2200 08/14/22 0410  BP: (!) 150/107 (!) 173/106  (!) 170/92  Pulse: 80 77  66  Resp: 18 19  20   Temp: 98 F (36.7 C) 97.8 F (36.6 C)  98.3 F (36.8 C)  TempSrc: Oral Oral  Oral  SpO2: 99% 99% 99% 98%  Weight:      Height:        Intake/Output Summary (Last 24 hours) at 08/14/2022 0915 Last data filed at 08/14/2022 0701 Gross per 24 hour  Intake 470 ml  Output 3100 ml  Net -2630 ml      08/11/2022   12:08 PM 08/10/2022    8:10 AM 03/21/2022    3:13 PM  Last 3 Weights  Weight (lbs) 246 lb 14.6 oz 248 lb 250 lb  Weight (kg) 112 kg 112.492 kg 113.399 kg     Body mass index is 35.43 kg/m.  General:  Well nourished, well developed, in no acute distress HEENT: normal Neck: no JVD Vascular: No carotid bruits; Distal pulses 2+ bilaterally Cardiac:  irreg, 3/6 systolic murmur rusb, no jvd Lungs:  clear to auscultation bilaterally, no wheezing, rhonchi or rales   Abd: soft, nontender, no hepatomegaly  Ext: no edema Musculoskeletal:  No deformities, BUE and BLE strength normal and equal Skin: warm and dry  Neuro:  CNs 2-12 intact, no focal abnormalities noted Psych:  Normal affect     Laboratory Data:  High Sensitivity Troponin:  No results for input(s): "TROPONINIHS" in the last 720 hours.   Chemistry Recent Labs  Lab 08/11/22 1325 08/12/22 0358 08/14/22 0200  NA 138 136 133*  K 3.7 3.6 3.4*  CL 107 105 104  CO2 23 22 22   GLUCOSE 127* 114* 118*  BUN 21 17 23   CREATININE 0.93 0.87 0.99  CALCIUM 10.6* 10.2 10.6*  MG 1.9  --   --   GFRNONAA >60 >60 >60  ANIONGAP 8 9 7     Recent Labs  Lab 08/10/22 0838 08/11/22 1325 08/12/22 0358  PROT 6.6 6.4* 5.9*  ALBUMIN 3.9 3.3* 2.9*  AST 17 17 17   ALT 11 14 13   ALKPHOS 71 71 59  BILITOT 0.6 0.4 0.9   Lipids  Recent Labs  Lab 08/10/22 0838  CHOL 111  TRIG 107.0  HDL 28.60*  LDLCALC 61  CHOLHDL 4    Hematology Recent Labs  Lab 08/10/22 0838 08/11/22 1325 08/12/22 0358  WBC 6.7 6.1 7.9  RBC 4.03* 4.20* 4.03*  HGB 12.1* 12.4* 12.4*  HCT 36.4* 38.1* 36.4*  MCV 90.3 90.7 90.3  MCH  --  29.5 30.8  MCHC 33.4 32.5 34.1  RDW 14.4 13.6 13.4  PLT 248.0 233 217   Thyroid  Recent Labs  Lab 08/10/22 0838  TSH 1.89    BNP Recent Labs  Lab 08/14/22 0200  BNP 76.9    DDimer No results for input(s): "DDIMER" in the last 168 hours.   Radiology/Studies:  MR BRAIN WO CONTRAST  Result Date:  08/11/2022 CLINICAL DATA:  Neuro deficit, acute, stroke suspected EXAM: MRI HEAD WITHOUT CONTRAST TECHNIQUE: Multiplanar, multiecho pulse sequences of the brain and surrounding structures were obtained without intravenous contrast. COMPARISON:  Same day CT Head FINDINGS: Brain: No acute infarction, hemorrhage, hydrocephalus, extra-axial collection or mass lesion. Sequela of moderate chronic microvascular ischemic change. Vascular: Normal flow voids. Skull and upper cervical spine: Normal  marrow signal. Sinuses/Orbits: Negative. Other: Likely small subcutaneous cyst in the suboccipital scalp. IMPRESSION: Negative for an acute infarct. Electronically Signed   By: Lorenza Cambridge M.D.   On: 08/11/2022 18:43   CT Head Wo Contrast  Result Date: 08/11/2022 CLINICAL DATA:  Vertigo, central EXAM: CT HEAD WITHOUT CONTRAST TECHNIQUE: Contiguous axial images were obtained from the base of the skull through the vertex without intravenous contrast. RADIATION DOSE REDUCTION: This exam was performed according to the departmental dose-optimization program which includes automated exposure control, adjustment of the mA and/or kV according to patient size and/or use of iterative reconstruction technique. COMPARISON:  None Available. FINDINGS: Brain: No evidence of acute infarction, hemorrhage, hydrocephalus, extra-axial collection or mass lesion/mass effect. There is sequela of mild chronic microvascular ischemic change. Vascular: No hyperdense vessel or unexpected calcification. Skull: Normal. Negative for fracture or focal lesion. Sinuses/Orbits: No middle ear or mastoid effusion. Paranasal sinuses are clear. Orbits are unremarkable. Other: None. IMPRESSION: No acute intracranial abnormality. No CT finding to explain vertigo. Electronically Signed   By: Lorenza Cambridge M.D.   On: 08/11/2022 14:42   DG Chest 1 View  Result Date: 08/11/2022 CLINICAL DATA:  Chest pain EXAM: CHEST  1 VIEW COMPARISON:  X-ray 12/14/2021 and CT scan FINDINGS: Underinflation. Enlarged cardiopericardial silhouette with vascular congestion centrally. No pneumothorax, effusion or consolidation. Eventration of the right hemidiaphragm. Film is under penetrated and rotated to the left as well as lordotic. IMPRESSION: Enlarged cardiopericardial silhouette with vascular congestion. Underinflation. Limited x-ray Electronically Signed   By: Karen Kays M.D.   On: 08/11/2022 14:40     Assessment and Plan:   1.HTN - home SBP on EMS eval  200s - elevated throughout admission, last 24 hrs SBP 150s-170s - he is on coreg 12.5mg  bid, clonidine 0.2mg  bid, HCTZ 12.5, losartan 100mg  which is also his home regimen. Also on doxazosin 4mg  would assume for prostate as opposed to HTN.   - from notes prior pauses on monitor, beta blocker reduced at the time. He has bifasciular block on EKG as well. Would not titrate coreg or clonidine.  - some orthostatic changes with standing, would not be more aggressive with diuretic.  - from notes on clonidine over 20 years, he is resistant to change - will start norvasc 5mg  daily, follow bp's - resistant HTN workup: he has prevoiusly declined OSA eval and continues to do so. Recent TSH normal, renin/aldo pending.Check renal artery [place]. Add on AM cortisol.     2.Dizziness -ongoing workup per primary team - thought possible to be due to vertigo. Negative workup thus far -orthostatics are abnormal SBP 166-->145 with standing, DBP 100-->84 lying to sitting. Symptoms mixed as far as being orthostatic dizziness. He reports stays well hydrated, symptoms have resolved essentially only on day of admission.  -no further cardiology workup at this time.    3. Permanent afib - continue current meds - he has favored coumadin over DOACs  4. CAD - 12/2021 cath as reported above, recs were for medical management - no acute issues  5. Aortic stenosis - moderate by 12/2021 echo, continue surveillance  6. Mitral regurgitation -eccentric, has been difficult to assess. Limited visualization on last echo - continue to monitor as outpatient.        For questions or updates, please contact [facility] Please consult [email or web address] for contact info under    Signed, Dina Rich, MD  08/14/2022 9:15 AM

## 2022-08-14 NOTE — Progress Notes (Signed)
PROGRESS NOTE    [name]  ZOX:[id number] DOB: 22-Aug-1950 DOA: 08/11/2022 PCP: [name], MD   Brief Narrative: [name] is a 72 y.o. male with a history of atrial fibrillation, obstructive sleep apnea, obesity, hypertension, hyperlipidemia, aortic stenosis, mitral valve dictation.  Patient presented secondary to dizziness and also found to have significant hypertension.  Concern for possible vertigo.  PT consulted for evaluation.  Antihypertensives restarted.   Assessment and Plan:  Dizziness Initial concern for possible vertigo.  Patient's history is not completely consistent with vertigo versus possible lightheadedness.  No nystagmus noted on exam.  Possibly related to significant hypertension but seems somewhat unlikely.  Vestibular PT consulted for evaluation.  MRI brain negative for acute stroke versus other process. Orthostatic vitals improved. Dizziness resolved. -PT recommendations: home health  Severe asymptomatic hypertension No evidence of endorgan damage.  Possibly related to unintentional adherence.  Home medications resumed. -Continue Coreg, clonidine, doxazosin, hydrochlorothiazide, losartan -Cardiology recommendations: added amlodipine, check AM cortisol, renal artery duplex  Hypercalcemia Mild. -Recheck BMP in AM  Pulmonary atrial fibrillation -Continue Coreg and Coumadin  Hyperlipidemia -Continue Nexlizet  Aortic stenosis Moderate on recent echo.  Noted  Obstructive sleep apnea Patient declined CPAP machine.  Obesity Estimated body mass index is 35.43 kg/m as calculated from the following:   Height as of this encounter: 5\' 10"  (1.778 m).   Weight as of this encounter: 112 kg.   DVT prophylaxis: Coumadin Code Status:   Code Status: DNR. Confirmed with patient and nephew at bedside. Recommended nephew bring paperwork from home. Family Communication: Nephew  Disposition Plan: Discharge home likely in 24 hours   Consultants:   Cardiology  Procedures:  None  Antimicrobials: None    Subjective: No dizziness. Bowel movement today.  Objective: BP (!) 164/114   Pulse 94   Temp 98.2 F (36.8 C) (Oral)   Resp 18   Ht 5\' 10"  (1.778 m)   Wt 112 kg   SpO2 98%   BMI 35.43 kg/m   Examination:  General exam: Appears calm and comfortable Respiratory system: Clear to auscultation. Respiratory effort normal. Cardiovascular system: S1 & S2 heard, RRR. Gastrointestinal system: Abdomen is distended, soft and non-tender. Normal bowel sounds heard. Central nervous system: Alert and oriented. No focal neurological deficits. Musculoskeletal: No edema. No calf tenderness Psychiatry: Judgement and insight appear normal. Mood & affect appropriate.    Data Reviewed: I have personally reviewed following labs and imaging studies  CBC Lab Results  Component Value Date   WBC 7.9 08/12/2022   RBC 4.03 (L) 08/12/2022   HGB 12.4 (L) 08/12/2022   HCT 36.4 (L) 08/12/2022   MCV 90.3 08/12/2022   MCH 30.8 08/12/2022   PLT 217 08/12/2022   MCHC 34.1 08/12/2022   RDW 13.4 08/12/2022   LYMPHSABS 1.0 08/11/2022   MONOABS 0.4 08/11/2022   EOSABS 0.0 08/11/2022   BASOSABS 0.0 08/11/2022     Last metabolic panel Lab Results  Component Value Date   NA 133 (L) 08/14/2022   K 3.4 (L) 08/14/2022   CL 104 08/14/2022   CO2 22 08/14/2022   BUN 23 08/14/2022   CREATININE 0.99 08/14/2022   GLUCOSE 118 (H) 08/14/2022   GFRNONAA >60 08/14/2022   GFRAA 72 04/17/2019   CALCIUM 10.6 (H) 08/14/2022   PROT 5.9 (L) 08/12/2022   ALBUMIN 2.9 (L) 08/12/2022   LABGLOB 2.3 01/07/2022   AGRATIO 1.7 01/07/2022   BILITOT 0.9 08/12/2022   ALKPHOS 59 08/12/2022  AST 17 08/12/2022   ALT 13 08/12/2022   ANIONGAP 7 08/14/2022    GFR: Estimated Creatinine Clearance: 84.5 mL/min (by C-G formula based on SCr of 0.99 mg/dL).  No results found for this or any previous visit (from the past 240 hour(s)).    Radiology Studies: No  results found.    LOS: 0 days    Jacquelin Hawking, MD [facility] 08/14/2022, 2:09 PM   If 7PM-7AM, please contact night-coverage [email or web address]

## 2022-08-14 NOTE — Progress Notes (Signed)
Physical Therapy Treatment Patient Details Name: [name] MRN: [id number] DOB: [birth year] Today's Date: 08/14/2022   History of Present Illness Pt is a 72 y/o male who presents with vertigo, prompting pt to call for emergency services. MRI head negative, BP in ED elevated. PMH significant for back pain, atrial fibrillation, obstructive sleep apnea, obesity, hypertension, hyperlipidemia, aortic stenosis    PT Comments    Patient up on [facility] on arrival. States not much sleep since he got here. Agreed to attempt ambulation with chair close follow. Only able to walk 15 ft with minguard assist (and +2 for chair follow). Limited by fatigue, weakness. Patient and nephew now wanting SNF, however only if the facility will allow nephew to bring in the pt's recliner (so he can sleep, get comfortable with his back pain). Feel pt would make good progress with continued therapies and is likely more than a week away from going home if he remains in the hospital (based on his performance and PT frequency of 3x/week).     Recommendations for follow up therapy are one component of a multi-disciplinary discharge planning process, led by the attending physician.  Recommendations may be updated based on patient status, additional functional criteria and insurance authorization.  Follow Up Recommendations  Can patient physically be transported by private vehicle: Yes    Assistance Recommended at Discharge Frequent or constant Supervision/Assistance  Patient can return home with the following A little help with walking and/or transfers;A little help with bathing/dressing/bathroom;Assistance with cooking/housework;Assist for transportation   Equipment Recommendations  None recommended by PT    Recommendations for Other Services Other (comment) (Mobility Team)     Precautions / Restrictions Precautions Precautions: Fall Precaution Comments: Vertigo (none since 6/6) Restrictions Weight Bearing Restrictions:  No     Mobility  Bed Mobility Overal bed mobility: Needs Assistance Bed Mobility: Sit to Sidelying         Sit to sidelying: Max assist General bed mobility comments: up on BSC on arrival; returned to bed at end of session; assist for legs up onto bed    Transfers Overall transfer level: Needs assistance Equipment used: Rolling walker (2 wheels) Transfers: Sit to/from Stand, Bed to chair/wheelchair/BSC Sit to Stand: From elevated surface, Min guard   Step pivot transfers: Min guard, From elevated surface       General transfer comment: pt places his RW backwards and uses one hand on crossbar and one to push up from bed. Sit to stand twice during session. Recliner seat elevated with blankets and pillow; pt able to flip RW around to correct position after each transfer sit to stand    Ambulation/Gait Ambulation/Gait assistance: Min guard, +2 safety/equipment Gait Distance (Feet): 15 Feet Assistive device: Rolling walker (2 wheels) Gait Pattern/deviations: Step-to pattern, Decreased stride length, Decreased dorsiflexion - right, Decreased dorsiflexion - left, Decreased weight shift to right, Knee flexed in stance - right, Knee flexed in stance - left, Shuffle, Trunk flexed   Gait velocity interpretation: <1.8 ft/sec, indicate of risk for recurrent falls   General Gait Details: Pt reports he leans to his left to offload rt hip due to pain and he can not stand straighter due to back pain. Guarding assist with chair close follow   Stairs             Wheelchair Mobility    Modified Rankin (Stroke Patients Only)       Balance Overall balance assessment: Needs assistance Sitting-balance support: Feet supported Sitting balance-Leahy Scale: Fair Sitting  balance - Comments: Pt required UE support for sitting due to back pain.   Standing balance support: Bilateral upper extremity supported, Reliant on assistive device for balance, During functional activity Standing  balance-Leahy Scale: Poor                              Cognition Arousal/Alertness: Awake/alert Behavior During Therapy: WFL for tasks assessed/performed Overall Cognitive Status: Within Functional Limits for tasks assessed                                          Exercises      General Comments General comments (skin integrity, edema, etc.): Nephew present. Agrees with pt that SNF is indicated IF he is allowed to have his recliner brought in...if not, then home.      Pertinent Vitals/Pain Pain Assessment Pain Assessment: Faces Faces Pain Scale: Hurts even more Pain Location: Back Pain Descriptors / Indicators: Aching, Constant Pain Intervention(s): Limited activity within patient's tolerance, Repositioned    Home Living Family/patient expects to be discharged to:: Private residence Living Arrangements: Alone                      Prior Function            PT Goals (current goals can now be found in the care plan section) Acute Rehab PT Goals Patient Stated Goal: Return to his home PT Goal Formulation: With patient/family Time For Goal Achievement: 08/19/22 Potential to Achieve Goals: Good Progress towards PT goals: Progressing toward goals    Frequency    Min 3X/week      PT Plan Discharge plan needs to be updated    Co-evaluation              AM-PAC PT "6 Clicks" Mobility   Outcome Measure  Help needed turning from your back to your side while in a flat bed without using bedrails?: A Little Help needed moving from lying on your back to sitting on the side of a flat bed without using bedrails?: A Lot Help needed moving to and from a bed to a chair (including a wheelchair)?: A Little Help needed standing up from a chair using your arms (e.g., wheelchair or bedside chair)?: A Little Help needed to walk in hospital room?: A Lot Help needed climbing 3-5 steps with a railing? : Total 6 Click Score: 14    End of  Session   Activity Tolerance: Patient limited by fatigue Patient left: with call bell/phone within reach;with family/visitor present;in bed   PT Visit Diagnosis: Unsteadiness on feet (R26.81);Dizziness and giddiness (R42);Pain;Muscle weakness (generalized) (M62.81) Pain - part of body:  (back)     Time: 4098-1191 PT Time Calculation (min) (ACUTE ONLY): 42 min  Charges:  $Gait Training: 8-22 mins $Therapeutic Activity: 23-37 mins                      Jerolyn Center, PT Acute Rehabilitation Services  Office [phone]    Zena Amos 08/14/2022, 10:48 AM

## 2022-08-14 NOTE — Progress Notes (Signed)
PROGRESS NOTE    [name]  NWG:[id number] DOB: 1950/08/19 DOA: 08/11/2022 PCP: [name], MD   Brief Narrative: [name] is a 72 y.o. male with a history of atrial fibrillation, obstructive sleep apnea, obesity, hypertension, hyperlipidemia, aortic stenosis, mitral valve dictation.  Patient presented secondary to dizziness and also found to have significant hypertension.  Concern for possible vertigo.  PT consulted for evaluation.  Antihypertensives restarted.   Assessment and Plan:  Dizziness Initial concern for possible vertigo.  Patient's history is not completely consistent with vertigo versus possible lightheadedness.  No nystagmus noted on exam.  Possibly related to significant hypertension but seems somewhat unlikely.  Vestibular PT consulted for evaluation.  MRI brain negative for acute stroke versus other process. Orthostatic vitals improved. Dizziness resolved. -PT recommendations: home health  Severe asymptomatic hypertension No evidence of endorgan damage.  Possibly related to unintentional adherence.  Home medications resumed. -Continue Coreg, clonidine, doxazosin, hydrochlorothiazide, losartan -Cardiology recommendations: added amlodipine, check AM cortisol, renal artery duplex  Hypercalcemia Mild. -Recheck BMP in AM  Pulmonary atrial fibrillation -Continue Coreg and Coumadin  Hyperlipidemia -Continue Nexlizet  Aortic stenosis Moderate on recent echo.  Noted  Obstructive sleep apnea Patient declined CPAP machine.  Obesity Estimated body mass index is 35.43 kg/m as calculated from the following:   Height as of this encounter: 5\' 10"  (1.778 m).   Weight as of this encounter: 112 kg.   DVT prophylaxis: Coumadin Code Status:   Code Status: Full Code Family Communication: Nephew  Disposition Plan: Discharge home likely in 24 hours   Consultants:  Cardiology  Procedures:  None  Antimicrobials: None    Subjective: No dizziness. Bowel  movement today.  Objective: BP (!) 164/114   Pulse 94   Temp 98.2 F (36.8 C) (Oral)   Resp 18   Ht 5\' 10"  (1.778 m)   Wt 112 kg   SpO2 98%   BMI 35.43 kg/m   Examination:  General exam: Appears calm and comfortable Respiratory system: Clear to auscultation. Respiratory effort normal. Cardiovascular system: S1 & S2 heard, RRR. Gastrointestinal system: Abdomen is distended, soft and non-tender. Normal bowel sounds heard. Central nervous system: Alert and oriented. No focal neurological deficits. Musculoskeletal: No edema. No calf tenderness Psychiatry: Judgement and insight appear normal. Mood & affect appropriate.    Data Reviewed: I have personally reviewed following labs and imaging studies  CBC Lab Results  Component Value Date   WBC 7.9 08/12/2022   RBC 4.03 (L) 08/12/2022   HGB 12.4 (L) 08/12/2022   HCT 36.4 (L) 08/12/2022   MCV 90.3 08/12/2022   MCH 30.8 08/12/2022   PLT 217 08/12/2022   MCHC 34.1 08/12/2022   RDW 13.4 08/12/2022   LYMPHSABS 1.0 08/11/2022   MONOABS 0.4 08/11/2022   EOSABS 0.0 08/11/2022   BASOSABS 0.0 08/11/2022     Last metabolic panel Lab Results  Component Value Date   NA 133 (L) 08/14/2022   K 3.4 (L) 08/14/2022   CL 104 08/14/2022   CO2 22 08/14/2022   BUN 23 08/14/2022   CREATININE 0.99 08/14/2022   GLUCOSE 118 (H) 08/14/2022   GFRNONAA >60 08/14/2022   GFRAA 72 04/17/2019   CALCIUM 10.6 (H) 08/14/2022   PROT 5.9 (L) 08/12/2022   ALBUMIN 2.9 (L) 08/12/2022   LABGLOB 2.3 01/07/2022   AGRATIO 1.7 01/07/2022   BILITOT 0.9 08/12/2022   ALKPHOS 59 08/12/2022   AST 17 08/12/2022   ALT 13 08/12/2022   ANIONGAP 7  08/14/2022    GFR: Estimated Creatinine Clearance: 84.5 mL/min (by C-G formula based on SCr of 0.99 mg/dL).  No results found for this or any previous visit (from the past 240 hour(s)).    Radiology Studies: No results found.    LOS: 0 days    Jacquelin Hawking, MD [facility] 08/14/2022, 10:52  AM   If 7PM-7AM, please contact night-coverage [email or web address]

## 2022-08-15 ENCOUNTER — Observation Stay (HOSPITAL_BASED_OUTPATIENT_CLINIC_OR_DEPARTMENT_OTHER): Payer: PPO

## 2022-08-15 ENCOUNTER — Encounter (HOSPITAL_COMMUNITY): Payer: PPO

## 2022-08-15 DIAGNOSIS — I10 Essential (primary) hypertension: Secondary | ICD-10-CM

## 2022-08-15 DIAGNOSIS — R42 Dizziness and giddiness: Secondary | ICD-10-CM | POA: Diagnosis not present

## 2022-08-15 DIAGNOSIS — I4821 Permanent atrial fibrillation: Secondary | ICD-10-CM | POA: Diagnosis not present

## 2022-08-15 DIAGNOSIS — I1A0 Resistant hypertension: Secondary | ICD-10-CM | POA: Diagnosis not present

## 2022-08-15 DIAGNOSIS — I350 Nonrheumatic aortic (valve) stenosis: Secondary | ICD-10-CM | POA: Diagnosis not present

## 2022-08-15 LAB — BASIC METABOLIC PANEL
Anion gap: 11 (ref 5–15)
BUN: 29 mg/dL — ABNORMAL HIGH (ref 8–23)
CO2: 22 mmol/L (ref 22–32)
Calcium: 10.9 mg/dL — ABNORMAL HIGH (ref 8.9–10.3)
Chloride: 102 mmol/L (ref 98–111)
Creatinine, Ser: 0.99 mg/dL (ref 0.61–1.24)
GFR, Estimated: >60 mL/min (ref >60)
Glucose, Bld: 93 mg/dL (ref 70–99)
Potassium: 3.6 mmol/L (ref 3.5–5.1)
Sodium: 135 mmol/L (ref 135–145)

## 2022-08-15 LAB — PROTIME-INR
INR: 2.7 — ABNORMAL HIGH (ref 0.8–1.2)
Prothrombin Time: 29.3 seconds — ABNORMAL HIGH (ref 11.4–15.2)

## 2022-08-15 LAB — VITAMIN D 25 HYDROXY (VIT D DEFICIENCY, FRACTURES): Vit D, 25-Hydroxy: 17.33 ng/mL — ABNORMAL LOW (ref 30–100)

## 2022-08-15 LAB — CORTISOL: Cortisol, Plasma: 25 ug/dL

## 2022-08-15 MED ORDER — WARFARIN SODIUM 7.5 MG PO TABS
7.5000 mg | ORAL_TABLET | Freq: Once | ORAL | Status: AC
  Administered 2022-08-15: 7.5 mg via ORAL
  Filled 2022-08-15: qty 1

## 2022-08-15 NOTE — Progress Notes (Signed)
Rounding Note    Patient Name: [name] Date of Encounter: 08/15/2022  [facility] Cardiologist: [name], MD   Subjective   BP 154/95. Denies any chest pain or dyspnea.  Reports no further lightheadedness  Inpatient Medications    Scheduled Meds:  amLODipine  5 mg Oral Daily   carvedilol  12.5 mg Oral BID WC   cloNIDine  0.2 mg Oral BID   doxazosin  4 mg Oral Daily   hydrochlorothiazide  12.5 mg Oral Daily   losartan  100 mg Oral Daily   lubiprostone  24 mcg Oral BID   sodium chloride flush  3 mL Intravenous Q12H   warfarin  7.5 mg Oral ONCE-1600   Warfarin - Pharmacist Dosing Inpatient   Does not apply q1600   Continuous Infusions:  PRN Meds: acetaminophen **OR** acetaminophen, labetalol, polyethylene glycol, senna-docusate   Vital Signs    Vitals:   08/14/22 2202 08/15/22 0619 08/15/22 0757 08/15/22 0919  BP: 127/79 (!) 156/87 (!) 183/112 (!) 154/95  Pulse: 77 82 89 68  Resp:  18    Temp: (!) 97.4 F (36.3 C) 97.8 F (36.6 C)    TempSrc:  Oral    SpO2: 97% 96%    Weight:      Height:        Intake/Output Summary (Last 24 hours) at 08/15/2022 1046 Last data filed at 08/15/2022 1042 Gross per 24 hour  Intake 463 ml  Output 900 ml  Net -437 ml      08/11/2022   12:08 PM 08/10/2022    8:10 AM 03/21/2022    3:13 PM  Last 3 Weights  Weight (lbs) 246 lb 14.6 oz 248 lb 250 lb  Weight (kg) 112 kg 112.492 kg 113.399 kg      Telemetry    Afib 50-80s, 2-3 second pauses - Personally Reviewed  ECG    No new ECG - Personally Reviewed  Physical Exam   GEN: No acute distress.   Neck: No JVD Cardiac: irregular, normal rate, 2/6 systolic murmur   Respiratory: Clear to auscultation bilaterally. GI: Soft, nontender, non-distended  MS: No edema; No deformity. Neuro:  Nonfocal  Psych: Normal affect   Labs    High Sensitivity Troponin:  No results for input(s): "TROPONINIHS" in the last 720 hours.   Chemistry Recent Labs  Lab  08/10/22 1610 08/10/22 0838 08/11/22 1325 08/12/22 0358 08/14/22 0200 08/15/22 0332  NA 140  --  138 136 133* 135  K 4.4  --  3.7 3.6 3.4* 3.6  CL 105  --  107 105 104 102  CO2 23  --  23 22 22 22   GLUCOSE 111*  --  127* 114* 118* 93  BUN 26*  --  21 17 23  29*  CREATININE 0.93  --  0.93 0.87 0.99 0.99  CALCIUM 10.8*  --  10.6* 10.2 10.6* 10.9*  MG  --   --  1.9  --   --   --   PROT 6.6  --  6.4* 5.9*  --   --   ALBUMIN 3.9  --  3.3* 2.9*  --   --   AST 17  --  17 17  --   --   ALT 11  --  14 13  --   --   ALKPHOS 71  --  71 59  --   --   BILITOT 0.6  --  0.4 0.9  --   --  GFRNONAA  --    < > >60 >60 >60 >60  ANIONGAP  --    < > 8 9 7 11    < > = values in this interval not displayed.    Lipids  Recent Labs  Lab 08/10/22 0838  CHOL 111  TRIG 107.0  HDL 28.60*  LDLCALC 61  CHOLHDL 4    Hematology Recent Labs  Lab 08/10/22 0838 08/11/22 1325 08/12/22 0358  WBC 6.7 6.1 7.9  RBC 4.03* 4.20* 4.03*  HGB 12.1* 12.4* 12.4*  HCT 36.4* 38.1* 36.4*  MCV 90.3 90.7 90.3  MCH  --  29.5 30.8  MCHC 33.4 32.5 34.1  RDW 14.4 13.6 13.4  PLT 248.0 233 217   Thyroid  Recent Labs  Lab 08/10/22 0838  TSH 1.89    BNP Recent Labs  Lab 08/14/22 0200  BNP 76.9    DDimer No results for input(s): "DDIMER" in the last 168 hours.   Radiology    No results found.  Cardiac Studies     Patient Profile     72 y.o. male with a hx of permanent afib, HTN, bradycardia, OSA, morbid obesity, CAD medically managed from 12/2021 cath report below, mod aortic stenosis who is being seen 08/14/2022 for the evaluation of HTN   Assessment & Plan    1.HTN - home SBP on EMS eval 200s - elevated throughout admission, last 24 hrs SBP 150s-170s - he is on coreg 12.5mg  bid, clonidine 0.2mg  bid, HCTZ 12.5, losartan 100mg  which is also his home regimen. Also on doxazosin 4mg  would assume for prostate as opposed to HTN.  - from notes prior pauses on monitor, beta blocker reduced at the time.  He has bifasciular block on EKG as well. Would not titrate coreg or clonidine.  - some orthostatic changes with standing, would not be more aggressive with diuretic.  - from notes on clonidine over 20 years, he is resistant to change - added norvasc 5mg  daily, follow bp's - resistant HTN workup: he has prevoiusly declined OSA eval and continues to do so. Recent TSH normal, renin/aldo pending.Check renal artery [place]. AM cortisol unremarkable   2.Dizziness -ongoing workup per primary team - thought possible to be due to vertigo. Negative workup thus far -orthostatics are abnormal SBP 166-->145 with standing, DBP 100-->84 lying to sitting. Symptoms mixed as far as being orthostatic dizziness. He reports stays well hydrated, symptoms have resolved essentially only on day of admission.  -no further cardiology workup at this time.    3. Permanent afib - continue current meds - he has favored coumadin over DOACs.  INR 2.7   4. CAD - 12/2021 cath as reported above, recs were for medical management - no acute issues   5. Aortic stenosis - moderate by 12/2021 echo, continue surveillance.  Discussed updating echocardiogram but he declines   6. Mitral regurgitation -eccentric, has been difficult to assess. Limited visualization on last echo - continue to monitor as outpatient.    For questions or updates, please contact  [facility] Please consult [email or web address] for contact info under        Signed, Little Ishikawa, MD  08/15/2022, 10:46 AM

## 2022-08-15 NOTE — TOC Progression Note (Signed)
Transition of Care [facility]) - Initial/Assessment Note    Patient Details  Name: [name] MRN: [id number] Date of Birth: 19-Jan-1951  Transition of Care [facility]) CM/SW Contact:    Ralene Bathe, LCSW Phone Number: 08/15/2022, 1:52 PM  Clinical Narrative:                 LCSW met with patient at bedside with the patient's nephew Christiane Ha present.  The patient is now agreeable to SNF.  The family was presented with the Medicare.gov nursing home rating list and the insurance authorization process was explained.  The patient does not have a facility preference and gave verbal permission for LCSW to provide ongoing updates to nephew.  LCSW will send referral and provide bed offers as available.   TOC following.   Expected Discharge Plan: Home w Home Health Services Barriers to Discharge: Continued Medical Work up   Patient Goals and CMS Choice            Expected Discharge Plan and Services                                              Prior Living Arrangements/Services     Patient language and need for interpreter reviewed:: Yes        Need for Family Participation in Patient Care: No (Comment) Care giver support system in place?: Yes (comment)   Criminal Activity/Legal Involvement Pertinent to Current Situation/Hospitalization: No - Comment as needed  Activities of Daily Living Home Assistive Devices/Equipment: Walker (specify type) ADL Screening (condition at time of admission) Patient's cognitive ability adequate to safely complete daily activities?: Yes Is the patient deaf or have difficulty hearing?: No Does the patient have difficulty seeing, even when wearing glasses/contacts?: No Does the patient have difficulty concentrating, remembering, or making decisions?: No Patient able to express need for assistance with ADLs?: Yes Does the patient have difficulty dressing or bathing?: No Independently performs ADLs?: No Communication: Independent Dressing  (OT): Needs assistance Is this a change from baseline?: Pre-admission baseline Grooming: Independent Feeding: Independent Bathing: Needs assistance Is this a change from baseline?: Pre-admission baseline Toileting: Needs assistance Is this a change from baseline?: Pre-admission baseline In/Out Bed: Needs assistance Is this a change from baseline?: Pre-admission baseline Walks in Home: Needs assistance Is this a change from baseline?: Pre-admission baseline Does the patient have difficulty walking or climbing stairs?: Yes Weakness of Legs: Both Weakness of Arms/Hands: None  Permission Sought/Granted   Permission granted to share information with : Yes, Verbal Permission Granted  Share Information with NAME: Lydia Guiles [facility])  [phone] (Home Phone)           Emotional Assessment              Admission diagnosis:  Dizziness [R42] Vertigo [R42] Patient Active Problem List   Diagnosis Date Noted   Essential hypertension 08/15/2022   Dizziness 08/11/2022   Non-ST elevation (NSTEMI) myocardial infarction [facility])    Elevated troponin 12/15/2021   Cellulitis of abdominal wall 12/15/2021   Generalized weakness 12/15/2021   Stasis ulcer (HCC) 07/15/2021   Nonrheumatic mitral valve regurgitation 01/14/2021   Nonrheumatic aortic valve stenosis 01/14/2021   Bradycardia 01/14/2021   Hypercalcemia 09/26/2020   Permanent atrial fibrillation (HCC)    Prediabetes 07/21/2016   Myalgia and myositis 10/17/2013   Right hip pain 10/17/2013   Unspecified vitamin D  deficiency 10/11/2013   Encounter for therapeutic drug monitoring 06/03/2013   OTHER CONSTIPATION 01/20/2010   PERSONAL HX COLONIC POLYPS 01/20/2010   OBSTRUCTIVE SLEEP APNEA 11/17/2008   ATRIAL FIBRILLATION 08/18/2008   Dyslipidemia 06/18/2007   OBESITY 06/18/2007   INTERNAL HEMORRHOIDS 06/18/2007   EXTERNAL HEMORRHOIDS 06/18/2007   Resistant hypertension 09/04/2006   MITRAL REGURGITATION, MODERATE  09/04/2006   RENAL CALCULUS, HX OF 09/04/2006   ADENOMATOUS COLONIC POLYP 07/29/2003   PCP:  [name], MD Pharmacy:   [facility] [id number] - [address], [place] - [address] [place] [address] Phone: [phone] Fax: [phone]     Social Determinants of Health (SDOH) Social History: SDOH Screenings   Food Insecurity: No Food Insecurity (08/14/2022)  Housing: Low Risk  (08/14/2022)  Transportation Needs: No Transportation Needs (08/14/2022)  Utilities: Not At Risk (08/14/2022)  Alcohol Screen: Low Risk  (03/21/2022)  Depression (PHQ2-9): Low Risk  (08/10/2022)  Financial Resource Strain: Low Risk  (03/21/2022)  Physical Activity: Insufficiently Active (03/21/2022)  Social Connections: Socially Isolated (03/21/2022)  Stress: No Stress Concern Present (03/21/2022)  Tobacco Use: Low Risk  (08/11/2022)   SDOH Interventions:     Readmission Risk Interventions     No data to display

## 2022-08-15 NOTE — Progress Notes (Signed)
Physical Therapy Treatment Patient Details Name: [name] MRN: [id number] DOB: [birth year] Today's Date: 08/15/2022   History of Present Illness Pt is a 72 y/o male who presents with vertigo, prompting pt to call for emergency services. MRI head negative, BP in ED elevated. PMH significant for back pain, atrial fibrillation, obstructive sleep apnea, obesity, hypertension, hyperlipidemia, aortic stenosis    PT Comments    Pt demonstrating continued progress towards acute goals this session. Pt able to come to stand x3 throughout session with min guard for safety and maintain standing >5 mins with RW for support before max fatigue and pt back pain limiting continued time in standing. Pt able to complete pre-gait activities and short gait in room with min guard and chair follow for safety. Pt continues to be limited by pain and significantly decreased activity tolerance. Current plan remains appropriate to address deficits and maximize functional independence and decrease caregiver burden. Will continue to follow acutely.     Recommendations for follow up therapy are one component of a multi-disciplinary discharge planning process, led by the attending physician.  Recommendations may be updated based on patient status, additional functional criteria and insurance authorization.  Follow Up Recommendations  Can patient physically be transported by private vehicle: Yes    Assistance Recommended at Discharge Frequent or constant Supervision/Assistance  Patient can return home with the following A little help with walking and/or transfers;A little help with bathing/dressing/bathroom;Assistance with cooking/housework;Assist for transportation   Equipment Recommendations  None recommended by PT    Recommendations for Other Services Other (comment) (Mobility Team)     Precautions / Restrictions Precautions Precautions: Fall Precaution Comments: Vertigo (none since 6/6) Restrictions Weight  Bearing Restrictions: No     Mobility  Bed Mobility Overal bed mobility: Needs Assistance             General bed mobility comments: up on BSC on arrival;    Transfers Overall transfer level: Needs assistance Equipment used: Rolling walker (2 wheels) Transfers: Sit to/from Stand, Bed to chair/wheelchair/BSC Sit to Stand: From elevated surface, Min guard   Step pivot transfers: Min guard       General transfer comment: pt places his RW backwards and uses one hand on crossbar and one to push up from bed. Sit to stand x3 during session.    Ambulation/Gait Ambulation/Gait assistance: Min guard, +2 safety/equipment (+2 for chair follow) Gait Distance (Feet): 6 Feet Assistive device: Rolling walker (2 wheels) Gait Pattern/deviations: Step-to pattern, Decreased stride length, Decreased dorsiflexion - right, Decreased dorsiflexion - left, Decreased weight shift to right, Knee flexed in stance - right, Knee flexed in stance - left, Shuffle, Trunk flexed Gait velocity: decr     General Gait Details: slow step to pattern in room, pt with decreased LLE clearnce throughout needing cues to increase, pt fatiguing quickly   Stairs             Wheelchair Mobility    Modified Rankin (Stroke Patients Only)       Balance Overall balance assessment: Needs assistance Sitting-balance support: Feet supported Sitting balance-Leahy Scale: Fair Sitting balance - Comments: Pt required UE support for sitting due to back pain.   Standing balance support: Bilateral upper extremity supported, Reliant on assistive device for balance, During functional activity Standing balance-Leahy Scale: Poor Standing balance comment: heavy reliance on UE supprot  Cognition Arousal/Alertness: Awake/alert Behavior During Therapy: WFL for tasks assessed/performed Overall Cognitive Status: Within Functional Limits for tasks assessed                                           Exercises      General Comments General comments (skin integrity, edema, etc.): nephew present and supportive      Pertinent Vitals/Pain Pain Assessment Pain Assessment: Faces Faces Pain Scale: Hurts even more Pain Location: Back, gastrointestinal pain Pain Descriptors / Indicators: Aching, Constant, Guarding, Discomfort Pain Intervention(s): Monitored during session, Limited activity within patient's tolerance    Home Living                          Prior Function            PT Goals (current goals can now be found in the care plan section) Acute Rehab PT Goals Patient Stated Goal: Return to his home PT Goal Formulation: With patient/family Time For Goal Achievement: 08/19/22 Progress towards PT goals: Progressing toward goals    Frequency    Min 3X/week      PT Plan      Co-evaluation              AM-PAC PT "6 Clicks" Mobility   Outcome Measure  Help needed turning from your back to your side while in a flat bed without using bedrails?: A Little Help needed moving from lying on your back to sitting on the side of a flat bed without using bedrails?: A Lot Help needed moving to and from a bed to a chair (including a wheelchair)?: A Little Help needed standing up from a chair using your arms (e.g., wheelchair or bedside chair)?: A Little Help needed to walk in hospital room?: A Lot Help needed climbing 3-5 steps with a railing? : Total 6 Click Score: 14    End of Session   Activity Tolerance: Patient limited by fatigue Patient left: with call bell/phone within reach;with family/visitor present;in bed Nurse Communication: Mobility status PT Visit Diagnosis: Unsteadiness on feet (R26.81);Dizziness and giddiness (R42);Pain;Muscle weakness (generalized) (M62.81) Pain - part of body:  (back)     Time: 1191-4782 PT Time Calculation (min) (ACUTE ONLY): 25 min  Charges:  $Gait Training: 8-22 mins $Therapeutic  Activity: 8-22 mins                     Oliwia Berzins R. PTA Acute Rehabilitation Services Office: [phone]   Catalina Antigua 08/15/2022, 4:29 PM

## 2022-08-15 NOTE — Progress Notes (Signed)
ANTICOAGULATION CONSULT NOTE  Pharmacy Consult for warfarin (continuation of home medication) Indication: atrial fibrillation  Allergies  Allergen Reactions   Statins Other (See Comments)    "Muscles were messed up"    Penicillins Other (See Comments)    Childhood reaction. Unknown per Pt, cannot remember ever trying beta lactams     Vital Signs: Temp: 97.8 F (36.6 C) (06/10 0619) Temp Source: Oral (06/10 0619) BP: 156/87 (06/10 0619) Pulse Rate: 82 (06/10 0619)  Labs: Recent Labs    08/13/22 0731 08/14/22 0200 08/15/22 0332  LABPROT 38.4* 36.3* 29.3*  INR 3.9* 3.6* 2.7*  CREATININE  --  0.99 0.99     Estimated Creatinine Clearance: 84.5 mL/min (by C-G formula based on SCr of 0.99 mg/dL).  Assessment: 79 yoM presented to the ED with cc of dizziness. PMH includes afib that is managed by warfarin. Pharmacy consulted to continue dosing warfarin while inpatient. PTA regimen is warfarin 7.5 mg PO MWF and 5 mg PO all other days with last INR documented at 2.9 on 07/13/2022. INR on admission 2.9.  INR now therapeutic at 2.7 after warfarin doses held x2 due to supratherapeutic INR (unclear cause for acute rise - has been maintained on current regimen since Feb 2024). Documented eating 50-75% of meals. No DDI noted.  Goal of Therapy:  INR 2-3 Monitor platelets by anticoagulation protocol: Yes   Plan:  Warfarin 7.5mg  PO x1 this evening c/w home regimen Daily INR, CBC  Rexford Maus, PharmD, BCPS 08/15/2022 7:54 AM

## 2022-08-15 NOTE — Progress Notes (Addendum)
PROGRESS NOTE    [name]  NWG:[id number] DOB: March 13, 1950 DOA: 08/11/2022 PCP: [name], MD   Brief Narrative: [name] is a 72 y.o. male with a history of atrial fibrillation, obstructive sleep apnea, obesity, hypertension, hyperlipidemia, aortic stenosis, mitral valve dictation.  Patient presented secondary to dizziness and also found to have significant hypertension.  Concern for possible vertigo.  PT consulted for evaluation.  Antihypertensives restarted. Cardiology consulted and added amlodipine. Resistant hypertension workup started.   Assessment and Plan:  Dizziness Initial concern for possible vertigo.  Patient's history is not completely consistent with vertigo versus possible lightheadedness.  No nystagmus noted on exam.  Possibly related to significant hypertension but seems somewhat unlikely.  Vestibular PT consulted for evaluation and recommend SNF.  MRI brain negative for acute stroke versus other process. Orthostatic vitals improved. Dizziness resolved.  Severe asymptomatic hypertension Resistant hypertension No evidence of endorgan damage.  Possibly related to unintentional adherence.  Home medications resumed. -Continue Coreg, clonidine, doxazosin, hydrochlorothiazide, losartan -Cardiology recommendations: added amlodipine, renal artery duplex  Hypercalcemia Mild. Tended up. Unclear etiology but could be related to hydrochlorothiazide. -Check PTH, vitamin D and ionized calcium -Metabolic panel in AM  Pulmonary atrial fibrillation -Continue Coreg and Coumadin  Hyperlipidemia -Continue Nexlizet  Aortic stenosis Moderate on recent echo.  Noted  Obstructive sleep apnea Patient declined CPAP machine.  Obesity Estimated body mass index is 35.43 kg/m as calculated from the following:   Height as of this encounter: 5\' 10"  (1.778 m).   Weight as of this encounter: 112 kg.   DVT prophylaxis: Coumadin Code Status:   Code Status: DNR. Family  Communication: Nephew at bedside Disposition Plan: Discharge pending continued cardiology workup for resistant hypertension   Consultants:  Cardiology  Procedures:  None  Antimicrobials: None    Subjective: Patient initially declined to have renal artery ultrasound, but changed his mind. No issues overnight. No issues this morning.  Objective: BP (!) 183/112 (BP Location: Right Arm)   Pulse 89   Temp 97.8 F (36.6 C) (Oral)   Resp 18   Ht 5\' 10"  (1.778 m)   Wt 112 kg   SpO2 96%   BMI 35.43 kg/m   Examination:  General exam: Appears calm and comfortable Respiratory system: Clear to auscultation. Respiratory effort normal. Cardiovascular system: S1 & S2 heard, RRR. No murmurs. Gastrointestinal system: Abdomen is nondistended, soft and nontender. Normal bowel sounds heard. Central nervous system: Alert and oriented. No focal neurological deficits. Musculoskeletal: No calf tenderness Psychiatry: Judgement and insight appear normal. Mood & affect appropriate.    Data Reviewed: I have personally reviewed following labs and imaging studies  CBC Lab Results  Component Value Date   WBC 7.9 08/12/2022   RBC 4.03 (L) 08/12/2022   HGB 12.4 (L) 08/12/2022   HCT 36.4 (L) 08/12/2022   MCV 90.3 08/12/2022   MCH 30.8 08/12/2022   PLT 217 08/12/2022   MCHC 34.1 08/12/2022   RDW 13.4 08/12/2022   LYMPHSABS 1.0 08/11/2022   MONOABS 0.4 08/11/2022   EOSABS 0.0 08/11/2022   BASOSABS 0.0 08/11/2022     Last metabolic panel Lab Results  Component Value Date   NA 135 08/15/2022   K 3.6 08/15/2022   CL 102 08/15/2022   CO2 22 08/15/2022   BUN 29 (H) 08/15/2022   CREATININE 0.99 08/15/2022   GLUCOSE 93 08/15/2022   GFRNONAA >60 08/15/2022   GFRAA 72 04/17/2019   CALCIUM 10.9 (H) 08/15/2022   PROT 5.9 (  L) 08/12/2022   ALBUMIN 2.9 (L) 08/12/2022   LABGLOB 2.3 01/07/2022   AGRATIO 1.7 01/07/2022   BILITOT 0.9 08/12/2022   ALKPHOS 59 08/12/2022   AST 17 08/12/2022    ALT 13 08/12/2022   ANIONGAP 11 08/15/2022    GFR: Estimated Creatinine Clearance: 84.5 mL/min (by C-G formula based on SCr of 0.99 mg/dL).  No results found for this or any previous visit (from the past 240 hour(s)).    Radiology Studies: No results found.    LOS: 0 days    Jacquelin Hawking, MD [facility] 08/15/2022, 9:10 AM   If 7PM-7AM, please contact night-coverage [email or web address]

## 2022-08-15 NOTE — NC FL2 (Signed)
[place] MEDICAID FL2 LEVEL OF CARE FORM     IDENTIFICATION  Patient Name: [name] Birthdate: [birth year] Sex: male Admission Date (Current Location): 08/11/2022  [facility] and [place] Number:  Producer, television/film/video and Address:  [facility] [place], [address], [place], [place] [address]      Provider Number: [id number]  Attending Physician Name and Address:  Narda Bonds, MD  Relative Name and Phone Number:  Lydia Guiles [facility]) [phone]    Current Level of Care: Hospital Recommended Level of Care: Skilled Nursing Facility Prior Approval Number:    Date Approved/Denied:   PASRR Number: [id number]  Discharge Plan: SNF    Current Diagnoses: Patient Active Problem List   Diagnosis Date Noted   Essential hypertension 08/15/2022   Dizziness 08/11/2022   Non-ST elevation (NSTEMI) myocardial infarction [facility])    Elevated troponin 12/15/2021   Cellulitis of abdominal wall 12/15/2021   Generalized weakness 12/15/2021   Stasis ulcer (HCC) 07/15/2021   Nonrheumatic mitral valve regurgitation 01/14/2021   Nonrheumatic aortic valve stenosis 01/14/2021   Bradycardia 01/14/2021   Hypercalcemia 09/26/2020   Permanent atrial fibrillation (HCC)    Prediabetes 07/21/2016   Myalgia and myositis 10/17/2013   Right hip pain 10/17/2013   Unspecified vitamin D deficiency 10/11/2013   Encounter for therapeutic drug monitoring 06/03/2013   OTHER CONSTIPATION 01/20/2010   PERSONAL HX COLONIC POLYPS 01/20/2010   OBSTRUCTIVE SLEEP APNEA 11/17/2008   ATRIAL FIBRILLATION 08/18/2008   Dyslipidemia 06/18/2007   OBESITY 06/18/2007   INTERNAL HEMORRHOIDS 06/18/2007   EXTERNAL HEMORRHOIDS 06/18/2007   Resistant hypertension 09/04/2006   MITRAL REGURGITATION, MODERATE 09/04/2006   RENAL CALCULUS, HX OF 09/04/2006   ADENOMATOUS COLONIC POLYP 07/29/2003    Orientation RESPIRATION BLADDER Height & Weight     Self, Time, Situation, Place  Normal,  Other (Comment) (non compliant with CPAP) Continent Weight: 246 lb 14.6 oz (112 kg) Height:  5\' 10"  (177.8 cm)  BEHAVIORAL SYMPTOMS/MOOD NEUROLOGICAL BOWEL NUTRITION STATUS      Continent Diet (see discharge summary)  AMBULATORY STATUS COMMUNICATION OF NEEDS Skin   Extensive Assist Verbally Normal                       Personal Care Assistance Level of Assistance  Bathing, Feeding, Dressing Bathing Assistance: Limited assistance Feeding assistance: Independent Dressing Assistance: Limited assistance     Functional Limitations Info  Sight, Speech, Hearing Sight Info: Impaired Hearing Info: Adequate Speech Info: Adequate    SPECIAL CARE FACTORS FREQUENCY  PT (By licensed PT), OT (By licensed OT)     PT Frequency: 5x/ week OT Frequency: 5x/ week            Contractures Contractures Info: Not present    Additional Factors Info  Allergies, Code Status Code Status Info: DNR Allergies Info: Statins  Penicillins           Current Medications (08/15/2022):  This is the current hospital active medication list Current Facility-Administered Medications  Medication Dose Route Frequency Provider Last Rate Last Admin   acetaminophen (TYLENOL) tablet 650 mg  650 mg Oral Q6H PRN Synetta Fail, MD   650 mg at 08/14/22 2103   Or   acetaminophen (TYLENOL) suppository 650 mg  650 mg Rectal Q6H PRN Synetta Fail, MD       amLODipine (NORVASC) tablet 5 mg  5 mg Oral Daily Antoine Poche, MD   5 mg at 08/15/22 [phone]  carvedilol (COREG) tablet 12.5 mg  12.5 mg Oral BID WC Synetta Fail, MD   12.5 mg at 08/15/22 0758   cloNIDine (CATAPRES) tablet 0.2 mg  0.2 mg Oral BID Synetta Fail, MD   0.2 mg at 08/15/22 0758   doxazosin (CARDURA) tablet 4 mg  4 mg Oral Daily Synetta Fail, MD   4 mg at 08/15/22 1610   hydrochlorothiazide (HYDRODIURIL) tablet 12.5 mg  12.5 mg Oral Daily Synetta Fail, MD   12.5 mg at 08/15/22 9604   labetalol (NORMODYNE)  injection 5 mg  5 mg Intravenous Q2H PRN Synetta Fail, MD   5 mg at 08/12/22 1824   losartan (COZAAR) tablet 100 mg  100 mg Oral Daily Synetta Fail, MD   100 mg at 08/15/22 0920   lubiprostone (AMITIZA) capsule 24 mcg  24 mcg Oral BID Synetta Fail, MD   24 mcg at 08/15/22 5409   polyethylene glycol (MIRALAX / GLYCOLAX) packet 17 g  17 g Oral Daily PRN Narda Bonds, MD       senna-docusate (Senokot-S) tablet 2 tablet  2 tablet Oral QHS PRN Narda Bonds, MD       sodium chloride flush (NS) 0.9 % injection 3 mL  3 mL Intravenous Q12H Synetta Fail, MD   3 mL at 08/15/22 1042   warfarin (COUMADIN) tablet 7.5 mg  7.5 mg Oral ONCE-1600 Rexford Maus, [facility]       Warfarin - Pharmacist Dosing Inpatient   Does not apply q1600 Arabella Merles, [facility]         Discharge Medications: Please see discharge summary for a list of discharge medications.  Relevant Imaging Results:  Relevant Lab Results:   Additional Information SSN [id number]  [employer] Yuli Lanigan, LCSW

## 2022-08-16 DIAGNOSIS — R42 Dizziness and giddiness: Secondary | ICD-10-CM | POA: Diagnosis not present

## 2022-08-16 DIAGNOSIS — I1A0 Resistant hypertension: Secondary | ICD-10-CM

## 2022-08-16 DIAGNOSIS — I350 Nonrheumatic aortic (valve) stenosis: Secondary | ICD-10-CM | POA: Diagnosis not present

## 2022-08-16 DIAGNOSIS — I4821 Permanent atrial fibrillation: Secondary | ICD-10-CM | POA: Diagnosis not present

## 2022-08-16 DIAGNOSIS — I10 Essential (primary) hypertension: Secondary | ICD-10-CM | POA: Diagnosis not present

## 2022-08-16 LAB — COMPREHENSIVE METABOLIC PANEL
ALT: 13 U/L (ref 0–44)
AST: 15 U/L (ref 15–41)
Albumin: 3 g/dL — ABNORMAL LOW (ref 3.5–5.0)
Alkaline Phosphatase: 60 U/L (ref 38–126)
Anion gap: 12 (ref 5–15)
BUN: 30 mg/dL — ABNORMAL HIGH (ref 8–23)
CO2: 22 mmol/L (ref 22–32)
Calcium: 11.2 mg/dL — ABNORMAL HIGH (ref 8.9–10.3)
Chloride: 103 mmol/L (ref 98–111)
Creatinine, Ser: 0.99 mg/dL (ref 0.61–1.24)
GFR, Estimated: >60 mL/min (ref >60)
Glucose, Bld: 105 mg/dL — ABNORMAL HIGH (ref 70–99)
Potassium: 3.6 mmol/L (ref 3.5–5.1)
Sodium: 137 mmol/L (ref 135–145)
Total Bilirubin: 0.6 mg/dL (ref 0.3–1.2)
Total Protein: 6.3 g/dL — ABNORMAL LOW (ref 6.5–8.1)

## 2022-08-16 LAB — PROTIME-INR
INR: 2.6 — ABNORMAL HIGH (ref 0.8–1.2)
Prothrombin Time: 28 seconds — ABNORMAL HIGH (ref 11.4–15.2)

## 2022-08-16 LAB — CALCIUM, IONIZED: Calcium, Ionized, Serum: 6.4 mg/dL — ABNORMAL HIGH (ref 4.5–5.6)

## 2022-08-16 LAB — CALCITRIOL (1,25 DI-OH VIT D): Vit D, 1,25-Dihydroxy: 42.1 pg/mL (ref 24.8–81.5)

## 2022-08-16 LAB — PARATHYROID HORMONE, INTACT (NO CA): PTH: 65 pg/mL (ref 15–65)

## 2022-08-16 MED ORDER — WARFARIN SODIUM 5 MG PO TABS
5.0000 mg | ORAL_TABLET | Freq: Once | ORAL | Status: AC
  Administered 2022-08-16: 5 mg via ORAL
  Filled 2022-08-16: qty 1

## 2022-08-16 NOTE — TOC Progression Note (Addendum)
Transition of Care [facility]) - Initial/Assessment Note    Patient Details  Name: [name] MRN: [id number] Date of Birth: 1950-10-08  Transition of Care [facility]) CM/SW Contact:    Ralene Bathe, LCSW Phone Number: 08/16/2022, 10:28 AM  Clinical Narrative:                 Addendum 1300- Patient's facility choice is [facility].  LCSW verified that the facility has a bed available and contacted [employer] to initiate the insurance authorization process.  At the request of the family, LCSW inquired as to whether the patient could bring his recliner from home and is awaiting the answer. Insurance authorization is pending.   LCSW presented bed offers to the patient and nephew.  The family is reviewing the facilities and will inform LCSW of their choice.  TOC following.   Expected Discharge Plan: Home w Home Health Services Barriers to Discharge: Continued Medical Work up   Patient Goals and CMS Choice            Expected Discharge Plan and Services                                              Prior Living Arrangements/Services     Patient language and need for interpreter reviewed:: Yes        Need for Family Participation in Patient Care: No (Comment) Care giver support system in place?: Yes (comment)   Criminal Activity/Legal Involvement Pertinent to Current Situation/Hospitalization: No - Comment as needed  Activities of Daily Living Home Assistive Devices/Equipment: Walker (specify type) ADL Screening (condition at time of admission) Patient's cognitive ability adequate to safely complete daily activities?: Yes Is the patient deaf or have difficulty hearing?: No Does the patient have difficulty seeing, even when wearing glasses/contacts?: No Does the patient have difficulty concentrating, remembering, or making decisions?: No Patient able to express need for assistance with ADLs?: Yes Does the patient have difficulty dressing or bathing?:  No Independently performs ADLs?: No Communication: Independent Dressing (OT): Needs assistance Is this a change from baseline?: Pre-admission baseline Grooming: Independent Feeding: Independent Bathing: Needs assistance Is this a change from baseline?: Pre-admission baseline Toileting: Needs assistance Is this a change from baseline?: Pre-admission baseline In/Out Bed: Needs assistance Is this a change from baseline?: Pre-admission baseline Walks in Home: Needs assistance Is this a change from baseline?: Pre-admission baseline Does the patient have difficulty walking or climbing stairs?: Yes Weakness of Legs: Both Weakness of Arms/Hands: None  Permission Sought/Granted   Permission granted to share information with : Yes, Verbal Permission Granted  Share Information with NAME: Lydia Guiles [facility])  [phone] (Home Phone)           Emotional Assessment              Admission diagnosis:  Dizziness [R42] Vertigo [R42] Patient Active Problem List   Diagnosis Date Noted   Essential hypertension 08/15/2022   Dizziness 08/11/2022   Non-ST elevation (NSTEMI) myocardial infarction [facility])    Elevated troponin 12/15/2021   Cellulitis of abdominal wall 12/15/2021   Generalized weakness 12/15/2021   Stasis ulcer (HCC) 07/15/2021   Nonrheumatic mitral valve regurgitation 01/14/2021   Nonrheumatic aortic valve stenosis 01/14/2021   Bradycardia 01/14/2021   Hypercalcemia 09/26/2020   Permanent atrial fibrillation (HCC)    Prediabetes 07/21/2016   Myalgia and myositis 10/17/2013  Right hip pain 10/17/2013   Unspecified vitamin D deficiency 10/11/2013   Encounter for therapeutic drug monitoring 06/03/2013   OTHER CONSTIPATION 01/20/2010   PERSONAL HX COLONIC POLYPS 01/20/2010   OBSTRUCTIVE SLEEP APNEA 11/17/2008   ATRIAL FIBRILLATION 08/18/2008   Dyslipidemia 06/18/2007   OBESITY 06/18/2007   INTERNAL HEMORRHOIDS 06/18/2007   EXTERNAL HEMORRHOIDS 06/18/2007    Resistant hypertension 09/04/2006   MITRAL REGURGITATION, MODERATE 09/04/2006   RENAL CALCULUS, HX OF 09/04/2006   ADENOMATOUS COLONIC POLYP 07/29/2003   PCP:  [name], MD Pharmacy:   [facility] [id number] - [address] [place] - [address] [place] [address] Phone: [phone] Fax: [phone]     Social Determinants of Health (SDOH) Social History: SDOH Screenings   Food Insecurity: No Food Insecurity (08/14/2022)  Housing: Low Risk  (08/14/2022)  Transportation Needs: No Transportation Needs (08/14/2022)  Utilities: Not At Risk (08/14/2022)  Alcohol Screen: Low Risk  (03/21/2022)  Depression (PHQ2-9): Low Risk  (08/10/2022)  Financial Resource Strain: Low Risk  (03/21/2022)  Physical Activity: Insufficiently Active (03/21/2022)  Social Connections: Socially Isolated (03/21/2022)  Stress: No Stress Concern Present (03/21/2022)  Tobacco Use: Low Risk  (08/11/2022)   SDOH Interventions:     Readmission Risk Interventions     No data to display

## 2022-08-16 NOTE — Progress Notes (Signed)
Cardiac monitoring staff called to notify HR in 30's and 40's with pause of 2.73. Notified Cardiologist.

## 2022-08-16 NOTE — Progress Notes (Signed)
Rounding Note    Patient Name: [name] Date of Encounter: 08/16/2022  [facility] Cardiologist: [name], MD   Subjective   BP labile, 108/83 to 169/85.  Denies any chest pain or dyspnea.  Reports no further lightheadedness  Inpatient Medications    Scheduled Meds:  amLODipine  5 mg Oral Daily   carvedilol  12.5 mg Oral BID WC   cloNIDine  0.2 mg Oral BID   doxazosin  4 mg Oral Daily   hydrochlorothiazide  12.5 mg Oral Daily   losartan  100 mg Oral Daily   lubiprostone  24 mcg Oral BID   sodium chloride flush  3 mL Intravenous Q12H   warfarin  5 mg Oral ONCE-1600   Warfarin - Pharmacist Dosing Inpatient   Does not apply q1600   Continuous Infusions:  PRN Meds: acetaminophen **OR** acetaminophen, labetalol, polyethylene glycol, senna-docusate   Vital Signs    Vitals:   08/15/22 1623 08/15/22 2122 08/16/22 0500 08/16/22 0918  BP: 112/81 (!) 151/87 (!) 148/94 (!) 169/85  Pulse: 82 62 64 68  Resp:  18 17 18   Temp:  97.7 F (36.5 C) 98 F (36.7 C) 98.1 F (36.7 C)  TempSrc:  Oral Oral Oral  SpO2:  97% 98% 95%  Weight:      Height:        Intake/Output Summary (Last 24 hours) at 08/16/2022 0956 Last data filed at 08/16/2022 0900 Gross per 24 hour  Intake 483 ml  Output 2100 ml  Net -1617 ml       08/11/2022   12:08 PM 08/10/2022    8:10 AM 03/21/2022    3:13 PM  Last 3 Weights  Weight (lbs) 246 lb 14.6 oz 248 lb 250 lb  Weight (kg) 112 kg 112.492 kg 113.399 kg      Telemetry    Afib 50-90s, - Personally Reviewed  ECG    No new ECG - Personally Reviewed  Physical Exam   GEN: No acute distress.   Neck: No JVD Cardiac: irregular, normal rate, 2/6 systolic murmur   Respiratory: Clear to auscultation bilaterally. GI: Soft, nontender, non-distended  MS: No edema; No deformity. Neuro:  Nonfocal  Psych: Normal affect   Labs    High Sensitivity Troponin:  No results for input(s): "TROPONINIHS" in the last 720 hours.    Chemistry Recent Labs  Lab 08/11/22 1325 08/12/22 0358 08/14/22 0200 08/15/22 0332 08/16/22 0335  NA 138 136 133* 135 137  K 3.7 3.6 3.4* 3.6 3.6  CL 107 105 104 102 103  CO2 23 22 22 22 22   GLUCOSE 127* 114* 118* 93 105*  BUN 21 17 23  29* 30*  CREATININE 0.93 0.87 0.99 0.99 0.99  CALCIUM 10.6* 10.2 10.6* 10.9* 11.2*  MG 1.9  --   --   --   --   PROT 6.4* 5.9*  --   --  6.3*  ALBUMIN 3.3* 2.9*  --   --  3.0*  AST 17 17  --   --  15  ALT 14 13  --   --  13  ALKPHOS 71 59  --   --  60  BILITOT 0.4 0.9  --   --  0.6  GFRNONAA >60 >60 >60 >60 >60  ANIONGAP 8 9 7 11 12      Lipids  Recent Labs  Lab 08/10/22 0838  CHOL 111  TRIG 107.0  HDL 28.60*  LDLCALC 61  CHOLHDL 4  Hematology Recent Labs  Lab 08/10/22 0838 08/11/22 1325 08/12/22 0358  WBC 6.7 6.1 7.9  RBC 4.03* 4.20* 4.03*  HGB 12.1* 12.4* 12.4*  HCT 36.4* 38.1* 36.4*  MCV 90.3 90.7 90.3  MCH  --  29.5 30.8  MCHC 33.4 32.5 34.1  RDW 14.4 13.6 13.4  PLT 248.0 233 217    Thyroid  Recent Labs  Lab 08/10/22 0838  TSH 1.89     BNP Recent Labs  Lab 08/14/22 0200  BNP 76.9     DDimer No results for input(s): "DDIMER" in the last 168 hours.   Radiology    VAS US RENAL ARTERY DUPLEX  Result Date: 08/15/2022 ABDOMINAL VISCERAL Patient Name:  [name]  Date of Exam:   08/15/2022 Medical Rec #: [id number]     Accession #:    [id number] Date of Birth: 07/17/1950     Patient Gender: M Patient Age:   15 years Exam Location:  [facility] Procedure:      VAS US RENAL ARTERY DUPLEX Referring Phys: [id number] Dorothe Pea BRANCH -------------------------------------------------------------------------------- Indications: Hyptension              History of AFIB, aortic stenosis. High Risk Factors: Hypertension, hyperlipidemia. Limitations: Obesity. Comparison Study: No priors. Performing Technologist: Marilynne Halsted RDMS, RVT  Examination Guidelines: A complete evaluation includes B-mode imaging,  spectral Doppler, color Doppler, and power Doppler as needed of all accessible portions of each vessel. Bilateral testing is considered an integral part of a complete examination. Limited examinations for reoccurring indications may be performed as noted.  Duplex Findings: +--------------------+--------+--------+------+--------------+ Mesenteric          PSV cm/sEDV cm/sPlaque   Comments    +--------------------+--------+--------+------+--------------+ Aorta Mid              95                                +--------------------+--------+--------+------+--------------+ Celiac Artery Origin                      not visualized +--------------------+--------+--------+------+--------------+ SMA Origin                                not visualized +--------------------+--------+--------+------+--------------+    +------------------+--------+--------+-------------------+ Right Renal ArteryPSV cm/sEDV cm/s      Comment       +------------------+--------+--------+-------------------+ Origin                            not well visualized +------------------+--------+--------+-------------------+ Proximal             91      27                       +------------------+--------+--------+-------------------+ Mid                  60      21                       +------------------+--------+--------+-------------------+ Distal               64      18                       +------------------+--------+--------+-------------------+ +-----------------+--------+--------+-------------------+ Left Renal ArteryPSV cm/sEDV  cm/s      Comment       +-----------------+--------+--------+-------------------+ Origin                           not well visualized +-----------------+--------+--------+-------------------+ Proximal           128      50                       +-----------------+--------+--------+-------------------+ Mid                 96      41                        +-----------------+--------+--------+-------------------+ Distal             119      55                       +-----------------+--------+--------+-------------------+ +------------+--------+--------+----+-----------+--------+--------+----+ Right KidneyPSV cm/sEDV cm/sRI  Left KidneyPSV cm/sEDV cm/sRI   +------------+--------+--------+----+-----------+--------+--------+----+ Upper Pole  42      13      0.68Upper Pole 27      8       0.71 +------------+--------+--------+----+-----------+--------+--------+----+ Mid         24      9       0.        26      11      0.56 +------------+--------+--------+----+-----------+--------+--------+----+ Lower Pole  19      10      0.49Lower Pole 33      12      0.64 +------------+--------+--------+----+-----------+--------+--------+----+ Hilar       40      12      0.72Hilar      34      10      0.71 +------------+--------+--------+----+-----------+--------+--------+----+ +------------------+-----+------------------+-----+ Right Kidney           Left Kidney             +------------------+-----+------------------+-----+ RAR                    RAR                     +------------------+-----+------------------+-----+ RAR (manual)      0.95 RAR (manual)      1.3   +------------------+-----+------------------+-----+ Cortex                 Cortex                  +------------------+-----+------------------+-----+ Cortex thickness       Corex thickness         +------------------+-----+------------------+-----+ Kidney length (cm)12.08Kidney length (cm)11.50 +------------------+-----+------------------+-----+  Summary: Renal:  Right: No evidence of right renal artery stenosis. In the proximal,        mid and distal segments. Normal right Resisitive Index.        Normal size right kidney. Left:  No evidence of left renal artery stenosis. In the proximal,        mid and distal segments. Normal  left Resistive Index. Normal        size of left kidney. Mesenteric:  Not visualized due to body habitus.  *See table(s) above for measurements and observations.  Diagnosing physician: Lemar Livings MD  Electronically signed by Lemar Livings MD on 08/15/2022 at 2:31:24  PM.    Final     Cardiac Studies     Patient Profile     72 y.o. male with a hx of permanent afib, HTN, bradycardia, OSA, morbid obesity, CAD medically managed from 12/2021 cath report below, mod aortic stenosis who is being seen 08/14/2022 for the evaluation of HTN   Assessment & Plan    1.HTN - home SBP on EMS eval 200s - he is on coreg 12.5mg  bid, clonidine 0.2mg  bid, HCTZ 12.5, losartan 100mg  which is also his home regimen. Also on doxazosin 4mg  would assume for prostate as opposed to HTN.  - from notes prior pauses on monitor, beta blocker reduced at the time. He has bifasciular block on EKG as well. Would not titrate coreg or clonidine.  - some orthostatic changes with standing, would not be more aggressive with diuretic.  - from notes on clonidine over 20 years, he is resistant to change - added norvasc 5mg  daily, BP appears improved - resistant HTN workup: he has prevoiusly declined OSA eval and continues to do so. Recent TSH normal, renin/aldo pending. No renal artery stenosis on duplex. AM cortisol unremarkable - has appointment with Dr Tenny Craw on 6/26, asked him to monitor BP daily and bring log to appointment   2.Dizziness - thought possible to be due to vertigo. Negative workup thus far -orthostatics are abnormal SBP 166-->145 with standing, DBP 100-->84 lying to sitting. Symptoms mixed as far as being orthostatic dizziness. He reports stays well hydrated, symptoms have resolved essentially -no further cardiology workup at this time.    3. Permanent afib - continue current meds - he has favored coumadin over DOACs.  INR 2.6   4. CAD - 12/2021 cath as reported above, recs were for medical management - no acute  issues   5. Aortic stenosis - moderate by 12/2021 echo, continue surveillance.  Discussed updating echocardiogram but he declines   6. Mitral regurgitation -eccentric, has been difficult to assess. Limited visualization on last echo but reported as trivial.  He declines repeating echo - continue to monitor as outpatient.   [facility] will sign off.   Medication Recommendations:  Continue home antihypertensive regimen, added amlodipine 5 mg daily Other recommendations (labs, testing, etc):  None Follow up as an outpatient:  Scheduled with Dr Tenny Craw on 6/26    For questions or updates, please contact [facility] Please consult [email or web address] for contact info under        Signed, Little Ishikawa, MD  08/16/2022, 9:56 AM

## 2022-08-16 NOTE — Progress Notes (Signed)
ANTICOAGULATION CONSULT NOTE  Pharmacy Consult for warfarin (continuation of home medication) Indication: atrial fibrillation  Allergies  Allergen Reactions   Statins Other (See Comments)    "Muscles were messed up"    Penicillins Other (See Comments)    Childhood reaction. Unknown per Pt, cannot remember ever trying beta lactams     Vital Signs: Temp: 98 F (36.7 C) (06/11 0500) Temp Source: Oral (06/11 0500) BP: 148/94 (06/11 0500) Pulse Rate: 64 (06/11 0500)  Labs: Recent Labs    08/14/22 0200 08/15/22 0332 08/16/22 0335  LABPROT 36.3* 29.3* 28.0*  INR 3.6* 2.7* 2.6*  CREATININE 0.99 0.99 0.99     Estimated Creatinine Clearance: 84.5 mL/min (by C-G formula based on SCr of 0.99 mg/dL).  Assessment: 41 yoM presented to the ED with cc of dizziness. PMH includes afib that is managed by warfarin. Pharmacy consulted to continue dosing warfarin while inpatient. PTA regimen is warfarin 7.5 mg PO MWF and 5 mg PO all other days with last INR documented at 2.9 on 07/13/2022. INR on admission 2.9.  INR therapeutic at 2.6. No DDI noted.  Goal of Therapy:  INR 2-3 Monitor platelets by anticoagulation protocol: Yes   Plan:  Warfarin 5mg  PO x1 this evening c/w home regimen Daily INR, CBC  Rexford Maus, PharmD, BCPS 08/16/2022 7:42 AM

## 2022-08-16 NOTE — Progress Notes (Signed)
PROGRESS NOTE    [name]  ZOX:[id number] DOB: February 13, 1951 DOA: 08/11/2022 PCP: [name], MD   Brief Narrative: [name] is a 72 y.o. male with a history of atrial fibrillation, obstructive sleep apnea, obesity, hypertension, hyperlipidemia, aortic stenosis, mitral valve dictation.  Patient presented secondary to dizziness and also found to have significant hypertension.  Concern for possible vertigo.  PT consulted for evaluation.  Antihypertensives restarted. Cardiology consulted and added amlodipine. Resistant hypertension workup started.   Assessment and Plan:  Dizziness Initial concern for possible vertigo.  Patient's history is not completely consistent with vertigo versus possible lightheadedness.  No nystagmus noted on exam.  Possibly related to significant hypertension but seems somewhat unlikely.  Vestibular PT consulted for evaluation and recommend SNF.  MRI brain negative for acute stroke versus other process. Orthostatic vitals improved. Dizziness resolved.  Severe asymptomatic hypertension Resistant hypertension No evidence of endorgan damage.  Possibly related to unintentional adherence.  Home medications resumed. Amlodipine added. Renal artery duplex negative for renal artery stenosis bilaterally. -Continue Coreg, clonidine, doxazosin, hydrochlorothiazide, losartan -Cardiology recommendations: added amlodipine, renal artery duplex -Repeat orthostatic vitals  Orthostatic hypotension Noted on initial orthostatic vitals. Improved now but antihypertensive regimen adjusted. -Repeat orthostatic vitals today  Hypercalcemia Mild. Tended up. Ionized calcium also elevated. Unclear etiology but could be related to hydrochlorothiazide. 25 vitamin D low -Follow-up PTH, 1,25-vitamin D -Metabolic panel in AM  Pulmonary atrial fibrillation -Continue Coreg and Coumadin  Hyperlipidemia -Continue Nexlizet  Aortic stenosis Moderate on recent echo.  Noted  Obstructive  sleep apnea Patient declined CPAP machine.  Obesity Estimated body mass index is 35.43 kg/m as calculated from the following:   Height as of this encounter: 5\' 10"  (1.778 m).   Weight as of this encounter: 112 kg.   DVT prophylaxis: Coumadin Code Status:   Code Status: DNR. Family Communication: Nephew at bedside Disposition Plan: Discharge to SNF pending bed availability.   Consultants:  Cardiology  Procedures:  6/10: Renal artery duplex  Antimicrobials: None    Subjective: No issues this morning or overnight.  Objective: BP (!) 148/94 (BP Location: Left Arm)   Pulse 64   Temp 98 F (36.7 C) (Oral)   Resp 17   Ht 5\' 10"  (1.778 m)   Wt 112 kg   SpO2 98%   BMI 35.43 kg/m   Examination:  General exam: Appears calm and comfortable Respiratory system: Clear to auscultation. Respiratory effort normal. Cardiovascular system: S1 & S2 heard, RRR. Systolic murmur Gastrointestinal system: Abdomen is nondistended, soft and nontender.Normal bowel sounds heard. Central nervous system: Alert and oriented. Psychiatry: Judgement and insight appear normal. Mood & affect appropriate.    Data Reviewed: I have personally reviewed following labs and imaging studies  CBC Lab Results  Component Value Date   WBC 7.9 08/12/2022   RBC 4.03 (L) 08/12/2022   HGB 12.4 (L) 08/12/2022   HCT 36.4 (L) 08/12/2022   MCV 90.3 08/12/2022   MCH 30.8 08/12/2022   PLT 217 08/12/2022   MCHC 34.1 08/12/2022   RDW 13.4 08/12/2022   LYMPHSABS 1.0 08/11/2022   MONOABS 0.4 08/11/2022   EOSABS 0.0 08/11/2022   BASOSABS 0.0 08/11/2022     Last metabolic panel Lab Results  Component Value Date   NA 137 08/16/2022   K 3.6 08/16/2022   CL 103 08/16/2022   CO2 22 08/16/2022   BUN 30 (H) 08/16/2022   CREATININE 0.99 08/16/2022   GLUCOSE 105 (H) 08/16/2022   GFRNONAA >  60 08/16/2022   GFRAA 72 04/17/2019   CALCIUM 11.2 (H) 08/16/2022   PROT 6.3 (L) 08/16/2022   ALBUMIN 3.0 (L)  08/16/2022   LABGLOB 2.3 01/07/2022   AGRATIO 1.7 01/07/2022   BILITOT 0.6 08/16/2022   ALKPHOS 60 08/16/2022   AST 15 08/16/2022   ALT 13 08/16/2022   ANIONGAP 12 08/16/2022    GFR: Estimated Creatinine Clearance: 84.5 mL/min (by C-G formula based on SCr of 0.99 mg/dL).  No results found for this or any previous visit (from the past 240 hour(s)).    Radiology Studies: VAS US RENAL ARTERY DUPLEX  Result Date: 08/15/2022 ABDOMINAL VISCERAL Patient Name:  [name]  Date of Exam:   08/15/2022 Medical Rec #: [id number]     Accession #:    [id number] Date of Birth: [birth year]     Patient Gender: M Patient Age:   47 years Exam Location:  [facility] Procedure:      VAS US RENAL ARTERY DUPLEX Referring Phys: [id number] Dorothe Pea BRANCH -------------------------------------------------------------------------------- Indications: Hyptension              History of AFIB, aortic stenosis. High Risk Factors: Hypertension, hyperlipidemia. Limitations: Obesity. Comparison Study: No priors. Performing Technologist: Marilynne Halsted RDMS, RVT  Examination Guidelines: A complete evaluation includes B-mode imaging, spectral Doppler, color Doppler, and power Doppler as needed of all accessible portions of each vessel. Bilateral testing is considered an integral part of a complete examination. Limited examinations for reoccurring indications may be performed as noted.  Duplex Findings: +--------------------+--------+--------+------+--------------+ Mesenteric          PSV cm/sEDV cm/sPlaque   Comments    +--------------------+--------+--------+------+--------------+ Aorta Mid              95                                +--------------------+--------+--------+------+--------------+ Celiac Artery Origin                      not visualized +--------------------+--------+--------+------+--------------+ SMA Origin                                not visualized  +--------------------+--------+--------+------+--------------+    +------------------+--------+--------+-------------------+ Right Renal ArteryPSV cm/sEDV cm/s      Comment       +------------------+--------+--------+-------------------+ Origin                            not well visualized +------------------+--------+--------+-------------------+ Proximal             91      27                       +------------------+--------+--------+-------------------+ Mid                  60      21                       +------------------+--------+--------+-------------------+ Distal               64      18                       +------------------+--------+--------+-------------------+ +-----------------+--------+--------+-------------------+ Left Renal ArteryPSV cm/sEDV cm/s      Comment       +-----------------+--------+--------+-------------------+  Origin                           not well visualized +-----------------+--------+--------+-------------------+ Proximal           128      50                       +-----------------+--------+--------+-------------------+ Mid                 96      41                       +-----------------+--------+--------+-------------------+ Distal             119      55                       +-----------------+--------+--------+-------------------+ +------------+--------+--------+----+-----------+--------+--------+----+ Right KidneyPSV cm/sEDV cm/sRI  Left KidneyPSV cm/sEDV cm/sRI   +------------+--------+--------+----+-----------+--------+--------+----+ Upper Pole  42      13      0.68Upper Pole 27      8       0.71 +------------+--------+--------+----+-----------+--------+--------+----+ Mid         24      9       0.        26      11      0.56 +------------+--------+--------+----+-----------+--------+--------+----+ Lower Pole  19      10      0.49Lower Pole 33      12      0.64  +------------+--------+--------+----+-----------+--------+--------+----+ Hilar       40      12      0.72Hilar      34      10      0.71 +------------+--------+--------+----+-----------+--------+--------+----+ +------------------+-----+------------------+-----+ Right Kidney           Left Kidney             +------------------+-----+------------------+-----+ RAR                    RAR                     +------------------+-----+------------------+-----+ RAR (manual)      0.95 RAR (manual)      1.3   +------------------+-----+------------------+-----+ Cortex                 Cortex                  +------------------+-----+------------------+-----+ Cortex thickness       Corex thickness         +------------------+-----+------------------+-----+ Kidney length (cm)12.08Kidney length (cm)11.50 +------------------+-----+------------------+-----+  Summary: Renal:  Right: No evidence of right renal artery stenosis. In the proximal,        mid and distal segments. Normal right Resisitive Index.        Normal size right kidney. Left:  No evidence of left renal artery stenosis. In the proximal,        mid and distal segments. Normal left Resistive Index. Normal        size of left kidney. Mesenteric:  Not visualized due to body habitus.  *See table(s) above for measurements and observations.  Diagnosing physician: Lemar Livings MD  Electronically signed by Lemar Livings MD on 08/15/2022 at 2:31:24 PM.    Final       LOS: 0 days  Jacquelin Hawking, MD [facility] 08/16/2022, 8:22 AM   If 7PM-7AM, please contact night-coverage [email or web address]

## 2022-08-17 DIAGNOSIS — I10 Essential (primary) hypertension: Secondary | ICD-10-CM | POA: Diagnosis not present

## 2022-08-17 DIAGNOSIS — R531 Weakness: Secondary | ICD-10-CM | POA: Diagnosis not present

## 2022-08-17 DIAGNOSIS — I951 Orthostatic hypotension: Secondary | ICD-10-CM | POA: Diagnosis not present

## 2022-08-17 DIAGNOSIS — R2689 Other abnormalities of gait and mobility: Secondary | ICD-10-CM | POA: Diagnosis not present

## 2022-08-17 DIAGNOSIS — G4733 Obstructive sleep apnea (adult) (pediatric): Secondary | ICD-10-CM | POA: Diagnosis not present

## 2022-08-17 DIAGNOSIS — R2681 Unsteadiness on feet: Secondary | ICD-10-CM | POA: Diagnosis not present

## 2022-08-17 DIAGNOSIS — I4821 Permanent atrial fibrillation: Secondary | ICD-10-CM | POA: Diagnosis not present

## 2022-08-17 DIAGNOSIS — R41841 Cognitive communication deficit: Secondary | ICD-10-CM | POA: Diagnosis not present

## 2022-08-17 DIAGNOSIS — I4891 Unspecified atrial fibrillation: Secondary | ICD-10-CM | POA: Diagnosis not present

## 2022-08-17 DIAGNOSIS — I350 Nonrheumatic aortic (valve) stenosis: Secondary | ICD-10-CM | POA: Diagnosis not present

## 2022-08-17 DIAGNOSIS — M6281 Muscle weakness (generalized): Secondary | ICD-10-CM | POA: Diagnosis not present

## 2022-08-17 DIAGNOSIS — I480 Paroxysmal atrial fibrillation: Secondary | ICD-10-CM | POA: Diagnosis not present

## 2022-08-17 DIAGNOSIS — I1A0 Resistant hypertension: Secondary | ICD-10-CM | POA: Diagnosis not present

## 2022-08-17 DIAGNOSIS — I2510 Atherosclerotic heart disease of native coronary artery without angina pectoris: Secondary | ICD-10-CM | POA: Diagnosis not present

## 2022-08-17 DIAGNOSIS — R42 Dizziness and giddiness: Secondary | ICD-10-CM | POA: Diagnosis not present

## 2022-08-17 DIAGNOSIS — E785 Hyperlipidemia, unspecified: Secondary | ICD-10-CM | POA: Diagnosis not present

## 2022-08-17 LAB — PROTIME-INR
INR: 2.6 — ABNORMAL HIGH (ref 0.8–1.2)
Prothrombin Time: 28.4 seconds — ABNORMAL HIGH (ref 11.4–15.2)

## 2022-08-17 MED ORDER — CALCIUM CARBONATE ANTACID 500 MG PO CHEW
1.0000 | CHEWABLE_TABLET | Freq: Two times a day (BID) | ORAL | Status: DC
  Administered 2022-08-17: 200 mg via ORAL
  Filled 2022-08-17: qty 1

## 2022-08-17 MED ORDER — WARFARIN SODIUM 7.5 MG PO TABS
7.5000 mg | ORAL_TABLET | Freq: Once | ORAL | Status: AC
  Administered 2022-08-17: 7.5 mg via ORAL
  Filled 2022-08-17: qty 1

## 2022-08-17 MED ORDER — VITAMIN D3 25 MCG PO TABS: 1000.0000 [IU] | ORAL_TABLET | Freq: Every day | ORAL | 0 refills | Status: AC

## 2022-08-17 MED ORDER — AMLODIPINE BESYLATE 5 MG PO TABS: 5.0000 mg | ORAL_TABLET | Freq: Every day | ORAL | 0 refills | Status: DC

## 2022-08-17 MED ORDER — VITAMIN D 25 MCG (1000 UNIT) PO TABS
1000.0000 [IU] | ORAL_TABLET | Freq: Every day | ORAL | Status: DC
  Administered 2022-08-17: 1000 [IU] via ORAL
  Filled 2022-08-17: qty 1

## 2022-08-17 MED ORDER — WARFARIN SODIUM 5 MG PO TABS: 7.5000 mg | ORAL_TABLET | Freq: Every day | ORAL | 0 refills | Status: DC

## 2022-08-17 NOTE — Progress Notes (Signed)
PROGRESS NOTE    [name]  ZOX:[id number] DOB: [birth year] DOA: 08/11/2022 PCP: [name], MD   Brief Narrative:  [name] is a 72 y.o. male with a history of atrial fibrillation, obstructive sleep apnea, obesity, hypertension, hyperlipidemia, aortic stenosis, mitral valve dictation. Patient presented secondary to dizziness and also found to have significant hypertension. Concern for possible vertigo. PT consulted for evaluation. Antihypertensives restarted. Cardiology consulted and added amlodipine. Resistant hypertension workup started.   Assessment & Plan:   Principal Problem:   Dizziness Active Problems:   Dyslipidemia   OBESITY   OBSTRUCTIVE SLEEP APNEA   Permanent atrial fibrillation (HCC)   Nonrheumatic mitral valve regurgitation   Nonrheumatic aortic valve stenosis   Essential hypertension  Dizziness Initial concern for possible vertigo.  Patient's history is not completely consistent with vertigo versus possible lightheadedness.  No nystagmus noted on exam.  Possibly related to significant hypertension but seems somewhat unlikely.  Vestibular PT consulted for evaluation and recommend SNF.  MRI brain negative for acute stroke versus other process. Orthostatic vitals improved. Dizziness resolved.   Severe asymptomatic Resistant hypertension No evidence of endorgan damage.  Possibly related to unintentional adherence.  Home medications resumed. Amlodipine added. Renal artery duplex negative for renal artery stenosis bilaterally. -Continue Coreg, clonidine, doxazosin, hydrochlorothiazide, losartan -Cardiology recommendations: added amlodipine, renal artery duplex   Orthostatic hypotension Noted on initial orthostatic vitals. Improved now, antihypertensive regimen adjusted.  Hypercalcemia Mild. Tended up. Ionized calcium also elevated. Unclear etiology but could be related to hydrochlorothiazide. 25 vitamin D low  Vitamin D deficiency: Will replenish vitamin D.    Paroxysmal atrial fibrillation -Controlled.  Continue Coreg and Coumadin.  Patient had 2.7 seconds pause while asleep.  Cardiology informed.  Likely due to sleep apnea which is undiagnosed.  Needs sleep study as outpatient.   Hyperlipidemia -Continue Nexlizet   Aortic stenosis Moderate on recent echo.  Noted   Obstructive sleep apnea Patient declined CPAP machine.  Dyspepsia: Tums.   Obesity Estimated body mass index is 35.43 kg/m as calculated from the following:   Height as of this encounter: 5\' 10"  (1.778 m).   Weight as of this encounter: 112 kg.  DVT prophylaxis: Coumadin   Code Status: DNR  Family Communication: Nephew present at bedside.  Plan of care discussed with patient in length and he/she verbalized understanding and agreed with it.  Status is: Observation The patient will require care spanning > 2 midnights and should be moved to inpatient because: Medically stable, pending placement to SNF.   Estimated body mass index is 35.43 kg/m as calculated from the following:   Height as of this encounter: 5\' 10"  (1.778 m).   Weight as of this encounter: 112 kg.    Nutritional Assessment: Body mass index is 35.43 kg/m.Marland Kitchen Seen by dietician.  I agree with the assessment and plan as outlined below: Nutrition Status:   Skin Assessment: I have examined the patient's skin and I agree with the wound assessment as performed by the wound care RN as outlined below:    Consultants:  Cardiology-signed off  Procedures:  None  Antimicrobials:  Anti-infectives (From admission, onward)    None         Subjective: Patient seen and examined.  He was complaining of indigestion.  No other complaint.  Objective: Vitals:   08/16/22 1606 08/16/22 1953 08/17/22 0819 08/17/22 0944  BP: (!) 158/76 (!) 168/96 (!) 151/117 (!) 137/91  Pulse: 66 74 79   Resp: 19 18  Temp: 98.1 F (36.7 C) 97.6 F (36.4 C)    TempSrc: Oral Oral    SpO2: 100% 100% 94%   Weight:       Height:        Intake/Output Summary (Last 24 hours) at 08/17/2022 1337 Last data filed at 08/17/2022 1117 Gross per 24 hour  Intake 300 ml  Output 1350 ml  Net -1050 ml   Filed Weights   08/11/22 1208  Weight: 112 kg    Examination:  General exam: Appears calm and comfortable  Respiratory system: Clear to auscultation. Respiratory effort normal. Cardiovascular system: S1 & S2 heard, RRR. No JVD, murmurs, rubs, gallops or clicks. No pedal edema. Gastrointestinal system: Abdomen is nondistended, soft and nontender. No organomegaly or masses felt. Normal bowel sounds heard. Central nervous system: Alert and oriented. No focal neurological deficits. Extremities: Symmetric 5 x 5 power. Skin: No rashes, lesions or ulcers Psychiatry: Judgement and insight appear normal. Mood & affect appropriate.    Data Reviewed: I have personally reviewed following labs and imaging studies  CBC: Recent Labs  Lab 08/11/22 1325 08/12/22 0358  WBC 6.1 7.9  NEUTROABS 4.6  --   HGB 12.4* 12.4*  HCT 38.1* 36.4*  MCV 90.7 90.3  PLT 233 217   Basic Metabolic Panel: Recent Labs  Lab 08/11/22 1325 08/12/22 0358 08/14/22 0200 08/15/22 0332 08/16/22 0335  NA 138 136 133* 135 137  K 3.7 3.6 3.4* 3.6 3.6  CL 107 105 104 102 103  CO2 23 22 22 22 22   GLUCOSE 127* 114* 118* 93 105*  BUN 21 17 23  29* 30*  CREATININE 0.93 0.87 0.99 0.99 0.99  CALCIUM 10.6* 10.2 10.6* 10.9* 11.2*  MG 1.9  --   --   --   --    GFR: Estimated Creatinine Clearance: 84.5 mL/min (by C-G formula based on SCr of 0.99 mg/dL). Liver Function Tests: Recent Labs  Lab 08/11/22 1325 08/12/22 0358 08/16/22 0335  AST 17 17 15   ALT 14 13 13   ALKPHOS 71 59 60  BILITOT 0.4 0.9 0.6  PROT 6.4* 5.9* 6.3*  ALBUMIN 3.3* 2.9* 3.0*   No results for input(s): "LIPASE", "AMYLASE" in the last 168 hours. No results for input(s): "AMMONIA" in the last 168 hours. Coagulation Profile: Recent Labs  Lab 08/13/22 0731  08/14/22 0200 08/15/22 0332 08/16/22 0335 08/17/22 0138  INR 3.9* 3.6* 2.7* 2.6* 2.6*   Cardiac Enzymes: No results for input(s): "CKTOTAL", "CKMB", "CKMBINDEX", "TROPONINI" in the last 168 hours. BNP (last 3 results) No results for input(s): "PROBNP" in the last 8760 hours. HbA1C: No results for input(s): "HGBA1C" in the last 72 hours. CBG: No results for input(s): "GLUCAP" in the last 168 hours. Lipid Profile: No results for input(s): "CHOL", "HDL", "LDLCALC", "TRIG", "CHOLHDL", "LDLDIRECT" in the last 72 hours. Thyroid Function Tests: No results for input(s): "TSH", "T4TOTAL", "FREET4", "T3FREE", "THYROIDAB" in the last 72 hours. Anemia Panel: No results for input(s): "VITAMINB12", "FOLATE", "FERRITIN", "TIBC", "IRON", "RETICCTPCT" in the last 72 hours. Sepsis Labs: No results for input(s): "PROCALCITON", "LATICACIDVEN" in the last 168 hours.  No results found for this or any previous visit (from the past 240 hour(s)).   Radiology Studies: No results found.  Scheduled Meds:  amLODipine  5 mg Oral Daily   calcium carbonate  1 tablet Oral BID   carvedilol  12.5 mg Oral BID WC   cloNIDine  0.2 mg Oral BID   doxazosin  4 mg Oral Daily   hydrochlorothiazide  12.5 mg Oral Daily   losartan  100 mg Oral Daily   lubiprostone  24 mcg Oral BID   sodium chloride flush  3 mL Intravenous Q12H   warfarin  7.5 mg Oral ONCE-1600   Warfarin - Pharmacist Dosing Inpatient   Does not apply q1600   Continuous Infusions:   LOS: 0 days   Hughie Closs, MD [facility]  08/17/2022, 1:37 PM   *Please note that this is a verbal dictation therefore any spelling or grammatical errors are due to the "Dragon [employer] One" system interpretation.  Please page via Amion and do not message via secure chat for urgent patient care matters. Secure chat can be used for non urgent patient care matters.  How to contact the [facility] Attending or Consulting provider 7A - 7P or covering provider during  after hours 7P -7A, for this patient?  Check the care team in [facility] and look for a) attending/consulting TRH provider listed and b) the [facility] team listed. Page or secure chat 7A-7P. Log into [email or web address] and use [place]'s universal password to access. If you do not have the password, please contact the hospital operator. Locate the [facility] provider you are looking for under [facility] and page to a number that you can be directly reached. If you still have difficulty reaching the provider, please page the [facility] (Director on Call) for the Hospitalists listed on amion for assistance.

## 2022-08-17 NOTE — Progress Notes (Signed)
Physical Therapy Treatment Patient Details Name: [name] MRN: [id number] DOB: Jul 05, 1950 Today's Date: 08/17/2022   History of Present Illness Pt is a 72 y/o male who presents with vertigo, prompting pt to call for emergency services. MRI head negative, BP in ED elevated. PMH significant for back pain, atrial fibrillation, obstructive sleep apnea, obesity, hypertension, hyperlipidemia, aortic stenosis    PT Comments    Pt greeted resting in bed and agreeable to session with steady progress towards acute goals, however pt continues to be limited by fatigue, decreased activity tolerance and pain. Pt eager to progress back to baseline and able to increase gait distance from previous session with min guard for safety and RW for support. Pt continues to fatigue quickly with slow effortful steps with low foot clearance, with pt unabel to correct with cues. Current plan remains appropriate to address deficits and maximize functional independence and decrease caregiver burden. Pt continues to benefit from skilled PT services to progress toward functional mobility goals.    Recommendations for follow up therapy are one component of a multi-disciplinary discharge planning process, led by the attending physician.  Recommendations may be updated based on patient status, additional functional criteria and insurance authorization.  Follow Up Recommendations  Can patient physically be transported by private vehicle: Yes    Assistance Recommended at Discharge Frequent or constant Supervision/Assistance  Patient can return home with the following A little help with walking and/or transfers;A little help with bathing/dressing/bathroom;Assistance with cooking/housework;Assist for transportation   Equipment Recommendations  None recommended by PT    Recommendations for Other Services       Precautions / Restrictions Precautions Precautions: Fall Precaution Comments: Vertigo (none since  6/6) Restrictions Weight Bearing Restrictions: No     Mobility  Bed Mobility Overal bed mobility: Needs Assistance Bed Mobility: Supine to Sit     Supine to sit: Min assist     General bed mobility comments: min A to eleavte trunk    Transfers Overall transfer level: Needs assistance Equipment used: Rolling walker (2 wheels) Transfers: Sit to/from Stand, Bed to chair/wheelchair/BSC Sit to Stand: From elevated surface, Min guard   Step pivot transfers: Min guard       General transfer comment: pt places his RW backwards and uses one hand on crossbar and one to push up from bed. Sit to stand x2 during session.    Ambulation/Gait Ambulation/Gait assistance: Min guard, +2 safety/equipment (+2 for chair follow) Gait Distance (Feet): 15 Feet Assistive device: Rolling walker (2 wheels) Gait Pattern/deviations: Step-to pattern, Decreased stride length, Decreased dorsiflexion - right, Decreased dorsiflexion - left, Decreased weight shift to right, Knee flexed in stance - right, Knee flexed in stance - left, Shuffle, Trunk flexed Gait velocity: decr     General Gait Details: slow step to pattern in room, pt with decreased LLE clearnce throughout needing cues to increase, pt fatiguing quickly   Stairs             Wheelchair Mobility    Modified Rankin (Stroke Patients Only)       Balance Overall balance assessment: Needs assistance Sitting-balance support: Feet supported Sitting balance-Leahy Scale: Fair Sitting balance - Comments: Pt required UE support for sitting due to back pain.   Standing balance support: Bilateral upper extremity supported, Reliant on assistive device for balance, During functional activity Standing balance-Leahy Scale: Poor Standing balance comment: heavy reliance on UE supprot  Cognition Arousal/Alertness: Awake/alert Behavior During Therapy: WFL for tasks assessed/performed Overall Cognitive  Status: Within Functional Limits for tasks assessed                                          Exercises      General Comments General comments (skin integrity, edema, etc.): nephew present and supportive      Pertinent Vitals/Pain Pain Assessment Pain Assessment: Faces Faces Pain Scale: Hurts even more Pain Location: Back, gastrointestinal pain Pain Descriptors / Indicators: Aching, Constant, Guarding, Discomfort Pain Intervention(s): Monitored during session, Limited activity within patient's tolerance    Home Living                          Prior Function            PT Goals (current goals can now be found in the care plan section) Acute Rehab PT Goals Patient Stated Goal: Return to his home PT Goal Formulation: With patient/family Time For Goal Achievement: 08/19/22 Progress towards PT goals: Progressing toward goals    Frequency    Min 3X/week      PT Plan      Co-evaluation              AM-PAC PT "6 Clicks" Mobility   Outcome Measure  Help needed turning from your back to your side while in a flat bed without using bedrails?: A Little Help needed moving from lying on your back to sitting on the side of a flat bed without using bedrails?: A Lot Help needed moving to and from a bed to a chair (including a wheelchair)?: A Little Help needed standing up from a chair using your arms (e.g., wheelchair or bedside chair)?: A Little Help needed to walk in hospital room?: A Lot Help needed climbing 3-5 steps with a railing? : Total 6 Click Score: 14    End of Session   Activity Tolerance: Patient limited by fatigue Patient left: with call bell/phone within reach;with family/visitor present;in bed Nurse Communication: Mobility status PT Visit Diagnosis: Unsteadiness on feet (R26.81);Dizziness and giddiness (R42);Pain;Muscle weakness (generalized) (M62.81) Pain - part of body:  (back)     Time: 1350-1419 PT Time Calculation  (min) (ACUTE ONLY): 29 min  Charges:  $Gait Training: 8-22 mins $Therapeutic Activity: 8-22 mins                     Adisa Vigeant R. PTA Acute Rehabilitation Services Office: [phone]   Catalina Antigua 08/17/2022, 3:52 PM

## 2022-08-17 NOTE — TOC Transition Note (Addendum)
Transition of Care [facility]) - CM/SW Discharge Note   Patient Details  Name: [name] MRN: [id number] Date of Birth: 1950-06-18  Transition of Care [facility]) CM/SW Contact:  Ralene Bathe, LCSW Phone Number: 08/17/2022, 3:14 PM   Clinical Narrative:    Patient will DC to: [facility]  Anticipated DC date: 08/17/2022 Family notified: Yes Transport by: Beverly Sessions   Per MD patient ready for DC to SNF.  Forensic psychologist number- [phone].  RN to call report prior to discharge [phone] room 219). RN, patient, patient's family, and facility notified of DC. Discharge Summary sent to facility.   CSW will sign off for now as social work intervention is no longer needed. Please consult [place] again if new needs arise.    Final next level of care: Skilled Nursing Facility Barriers to Discharge: Barriers Resolved   Patient Goals and CMS Choice      Discharge Placement                Patient chooses bed at:  [facility]) Patient to be transferred to facility by: Family Name of family member notified: Lydia Guiles [facility]) [phone] Patient and family notified of of transfer: 08/17/22  Discharge Plan and Services Additional resources added to the After Visit Summary for                                       Social Determinants of Health (SDOH) Interventions SDOH Screenings   Food Insecurity: No Food Insecurity (08/14/2022)  Housing: Low Risk  (08/14/2022)  Transportation Needs: No Transportation Needs (08/14/2022)  Utilities: Not At Risk (08/14/2022)  Alcohol Screen: Low Risk  (03/21/2022)  Depression (PHQ2-9): Low Risk  (08/10/2022)  Financial Resource Strain: Low Risk  (03/21/2022)  Physical Activity: Insufficiently Active (03/21/2022)  Social Connections: Socially Isolated (03/21/2022)  Stress: No Stress Concern Present (03/21/2022)  Tobacco Use: Low Risk  (08/11/2022)     Readmission Risk Interventions     No data to display

## 2022-08-17 NOTE — Progress Notes (Signed)
Patient discharged per MD order. Discharge instructions diagnosis, treatment, medication, and follow up appointments discussed with Danelle Berry LPN at [facility]. IV removed with tip intact no signs of redness or irritation. Patient transported via wheelchair by nephew Maryella Shivers.

## 2022-08-17 NOTE — Progress Notes (Signed)
Received message from RN that patient having pauses up to 2.7 seconds while asleep.  Suspect this is due to untreated OSA, have recommended sleep study but patient declines.  Telemetry reviewed, remains in rate controlled Afib with pauses while asleep, no sustained bradycardia.  No further cardiac work-up recommended at this time.  [facility] will sign off.   Medication Recommendations:  Continue home antihypertensive regimen, added amlodipine 5 mg daily Other recommendations (labs, testing, etc):  None Follow up as an outpatient:  Scheduled with Dr Tenny Craw on 6/26  Little Ishikawa, MD

## 2022-08-17 NOTE — Progress Notes (Signed)
ANTICOAGULATION CONSULT NOTE  Pharmacy Consult for warfarin (continuation of home medication) Indication: atrial fibrillation  Allergies  Allergen Reactions   Statins Other (See Comments)    "Muscles were messed up"    Penicillins Other (See Comments)    Childhood reaction. Unknown per Pt, cannot remember ever trying beta lactams     Vital Signs: Temp: 97.6 F (36.4 C) (06/11 1953) Temp Source: Oral (06/11 1953) BP: 168/96 (06/11 1953) Pulse Rate: 74 (06/11 1953)  Labs: Recent Labs    08/15/22 0332 08/16/22 0335 08/17/22 0138  LABPROT 29.3* 28.0* 28.4*  INR 2.7* 2.6* 2.6*  CREATININE 0.99 0.99  --      Estimated Creatinine Clearance: 84.5 mL/min (by C-G formula based on SCr of 0.99 mg/dL).  Assessment: 85 yoM presented to the ED with cc of dizziness. PMH includes afib that is managed by warfarin. Pharmacy consulted to continue dosing warfarin while inpatient. PTA regimen is warfarin 7.5 mg PO MWF and 5 mg PO all other days.  INR therapeutic at 2.6. No DDI noted.  Goal of Therapy:  INR 2-3 Monitor platelets by anticoagulation protocol: Yes   Plan:  Warfarin 7.5mg  PO x1 this evening c/w home regimen Daily INR, CBC  Rexford Maus, PharmD, BCPS 08/17/2022 7:43 AM

## 2022-08-17 NOTE — Discharge Summary (Addendum)
Physician Discharge Summary  [name]:[id number] DOB: 01/28/1951 DOA: 08/11/2022  PCP: [name], MD  Admit date: 08/11/2022 Discharge date: 08/17/2022    Admitted From: Home Disposition: SNF  Recommendations for Outpatient Follow-up:  Follow up with PCP in 1-2 weeks Please obtain BMP/CBC in one week 's arrange sleep study as outpatient.  Suspected sleep apnea. Please follow up with your PCP on the following pending results: Unresulted Labs (From admission, onward)     Start     Ordered   08/14/22 0938  Aldosterone + renin activity w/ ratio  Once,   R       Question:  Specimen collection method  Answer:  Lab=Lab collect   08/14/22 0937   08/14/22 0500  Protime-INR  Daily,   R     Question:  Specimen collection method  Answer:  Lab=Lab collect   08/13/22 0853              Home Health: None Equipment/Devices: None  Discharge Condition: Stable CODE STATUS: DNR Diet recommendation: Cardiac  Subjective: Seen and examined.  Other than indigestion, no other complaint.  Brief/Interim Summary: [name] is a 72 y.o. male with a history of atrial fibrillation, obstructive sleep apnea, obesity, hypertension, hyperlipidemia, aortic stenosis, mitral valve dictation. Patient presented secondary to dizziness and also found to have significant hypertension. Concern for possible vertigo. PT consulted for evaluation. Antihypertensives restarted. Cardiology consulted and added amlodipine. Resistant hypertension workup started.  Details below.  Dizziness Initial concern for possible vertigo.  Patient's history is not completely consistent with vertigo versus possible lightheadedness.  No nystagmus noted on exam.  Possibly related to significant hypertension but seems somewhat unlikely.  Vestibular PT consulted for evaluation and recommend SNF.  MRI brain negative for acute stroke versus other process. Orthostatic vitals improved. Dizziness resolved.   Severe asymptomatic  Resistant hypertension No evidence of endorgan damage.  Possibly related to unintentional adherence.  Home medications resumed. Amlodipine added. Renal artery duplex negative for renal artery stenosis bilaterally. -Continue Coreg, clonidine, doxazosin, hydrochlorothiazide, losartan -Cardiology recommendations: added amlodipine, renal artery duplex  Addendum: Received a message from the nurse that the nephew had some questions regarding some medications.  I spoke to the nephew over the phone.  He told me that in the past, they have had bad experience with amlodipine which has caused bradycardia and hypotension.  He was not comfortable with patient discharging with amlodipine and strongly requested that I discontinue that medication.  We had lengthy discussions with pros and cons.  After that, per his request, we are discontinuing his amlodipine.  He would assess the patient himself overnight nursing home and will reach out to patient's PCP if amlodipine needs to be placed back on patient's regimen.   Orthostatic hypotension Noted on initial orthostatic vitals. Improved now, antihypertensive regimen adjusted.   Hypercalcemia Mild. Tended up. Ionized calcium also elevated. Unclear etiology but could be related to hydrochlorothiazide. 25 vitamin D low   Vitamin D deficiency: Will replenish vitamin D.   Paroxysmal atrial fibrillation -Controlled.  Continue Coreg and Coumadin.  Patient had 2.7 seconds pause while asleep.  Cardiology informed.  Likely due to sleep apnea which is undiagnosed.  Needs sleep study as outpatient.   Hyperlipidemia -Continue Nexlizet   Aortic stenosis Moderate on recent echo.  Noted   Obstructive sleep apnea Patient declined CPAP machine.   Dyspepsia: Tums.   Obesity Estimated body mass index is 35.43 kg/m as calculated from the following:   Height  as of this encounter: 5\' 10"  (1.778 m).   Weight as of this encounter: 112 kg.  Discharge plan was discussed with  patient and/or family member and they verbalized understanding and agreed with it.  Discharge Diagnoses:  Principal Problem:   Dizziness Active Problems:   Dyslipidemia   OBESITY   OBSTRUCTIVE SLEEP APNEA   Permanent atrial fibrillation (HCC)   Nonrheumatic mitral valve regurgitation   Nonrheumatic aortic valve stenosis   Essential hypertension    Discharge Instructions   Allergies as of 08/17/2022       Reactions   Statins Other (See Comments)   "Muscles were messed up"    Penicillins Other (See Comments)   Childhood reaction. Unknown per Pt, cannot remember ever trying beta lactams         Medication List     TAKE these medications    acetaminophen 325 MG tablet Commonly known as: TYLENOL Take 650 mg by mouth daily as needed (pain).   carvedilol 12.5 MG tablet Commonly known as: COREG Take 1 tablet (12.5 mg total) by mouth 2 (two) times daily with a meal.   cloNIDine 0.2 MG tablet Commonly known as: CATAPRES Take 1 tablet (0.2 mg total) by mouth 2 (two) times daily.   doxazosin 4 MG tablet Commonly known as: CARDURA Take 1 tablet (4 mg total) by mouth daily.   hydrochlorothiazide 12.5 MG capsule Commonly known as: MICROZIDE Take 1 capsule (12.5 mg total) by mouth daily.   losartan 100 MG tablet Commonly known as: COZAAR Take 1 tablet (100 mg total) by mouth daily.   lubiprostone 24 MCG capsule Commonly known as: AMITIZA Take 1 capsule (24 mcg total) by mouth 2 (two) times daily.   Nexlizet 180-10 MG Tabs Generic drug: Bempedoic Acid-Ezetimibe Take 1 tablet by mouth daily. (Stop taking ezetimibe when you start taking Nexlizet)   OMEGA-3 FATTY ACIDS PO Take 1 capsule by mouth in the morning and at bedtime.   vitamin D3 25 MCG tablet Commonly known as: CHOLECALCIFEROL Take 1 tablet (1,000 Units total) by mouth daily.   warfarin 5 MG tablet Commonly known as: COUMADIN Take as directed. If you are unsure how to take this medication, talk to your  nurse or doctor. Original instructions: Take 1.5 tablets (7.5 mg total) by mouth daily. Patient takes 7.5 mg (1.5 tabs) MWF and 5 mg (1 tab) all other days        Contact information for follow-up providers     [facility] Follow up.   Specialty: Home Health Services Why: Someone will call you to schedule first home visit. If you have not received a call after two days of discharging home, call their number listed. If no one comes to assess, call Case Manager at [phone]. Contact information: [address] [place] [address] [phone]         [name], MD Follow up in 1 week(s).   Specialty: Family Medicine Contact information: [address] [place] [address] [phone]              Contact information for after-discharge care     Destination     [facility] [place] [facility] .   Service: Skilled Nursing Contact information: [address] [place] [address] [phone]                    Allergies  Allergen Reactions   Statins Other (See Comments)    "Muscles were  messed up"    Penicillins Other (See Comments)    Childhood reaction. Unknown per Pt, cannot remember ever trying beta lactams     Consultations: Cardiology   Procedures/Studies: VAS US RENAL ARTERY DUPLEX  Result Date: 08/15/2022 ABDOMINAL VISCERAL Patient Name:  ARLISS OUTHOUSE  Date of Exam:   08/15/2022 Medical Rec #: [id number]     Accession #:    [id number] Date of Birth: [birth year]     Patient Gender: M Patient Age:   55 years Exam Location:  [facility] Procedure:      VAS US RENAL ARTERY DUPLEX Referring Phys: [id number] Dorothe Pea BRANCH -------------------------------------------------------------------------------- Indications: Hyptension              History of AFIB, aortic stenosis. High Risk Factors: Hypertension, hyperlipidemia. Limitations: Obesity. Comparison Study: No priors. Performing  Technologist: Marilynne Halsted RDMS, RVT  Examination Guidelines: A complete evaluation includes B-mode imaging, spectral Doppler, color Doppler, and power Doppler as needed of all accessible portions of each vessel. Bilateral testing is considered an integral part of a complete examination. Limited examinations for reoccurring indications may be performed as noted.  Duplex Findings: +--------------------+--------+--------+------+--------------+ Mesenteric          PSV cm/sEDV cm/sPlaque   Comments    +--------------------+--------+--------+------+--------------+ Aorta Mid              95                                +--------------------+--------+--------+------+--------------+ Celiac Artery Origin                      not visualized +--------------------+--------+--------+------+--------------+ SMA Origin                                not visualized +--------------------+--------+--------+------+--------------+    +------------------+--------+--------+-------------------+ Right Renal ArteryPSV cm/sEDV cm/s      Comment       +------------------+--------+--------+-------------------+ Origin                            not well visualized +------------------+--------+--------+-------------------+ Proximal             91      27                       +------------------+--------+--------+-------------------+ Mid                  60      21                       +------------------+--------+--------+-------------------+ Distal               64      18                       +------------------+--------+--------+-------------------+ +-----------------+--------+--------+-------------------+ Left Renal ArteryPSV cm/sEDV cm/s      Comment       +-----------------+--------+--------+-------------------+ Origin                           not well visualized +-----------------+--------+--------+-------------------+ Proximal           128      50                        +-----------------+--------+--------+-------------------+  Mid                 96      41                       +-----------------+--------+--------+-------------------+ Distal             119      55                       +-----------------+--------+--------+-------------------+ +------------+--------+--------+----+-----------+--------+--------+----+ Right KidneyPSV cm/sEDV cm/sRI  Left KidneyPSV cm/sEDV cm/sRI   +------------+--------+--------+----+-----------+--------+--------+----+ Upper Pole  42      13      0.68Upper Pole 27      8       0.71 +------------+--------+--------+----+-----------+--------+--------+----+ Mid         24      9       0.        26      11      0.56 +------------+--------+--------+----+-----------+--------+--------+----+ Lower Pole  19      10      0.49Lower Pole 33      12      0.64 +------------+--------+--------+----+-----------+--------+--------+----+ Hilar       40      12      0.72Hilar      34      10      0.71 +------------+--------+--------+----+-----------+--------+--------+----+ +------------------+-----+------------------+-----+ Right Kidney           Left Kidney             +------------------+-----+------------------+-----+ RAR                    RAR                     +------------------+-----+------------------+-----+ RAR (manual)      0.95 RAR (manual)      1.3   +------------------+-----+------------------+-----+ Cortex                 Cortex                  +------------------+-----+------------------+-----+ Cortex thickness       Corex thickness         +------------------+-----+------------------+-----+ Kidney length (cm)12.08Kidney length (cm)11.50 +------------------+-----+------------------+-----+  Summary: Renal:  Right: No evidence of right renal artery stenosis. In the proximal,        mid and distal segments. Normal right Resisitive Index.        Normal size right  kidney. Left:  No evidence of left renal artery stenosis. In the proximal,        mid and distal segments. Normal left Resistive Index. Normal        size of left kidney. Mesenteric:  Not visualized due to body habitus.  *See table(s) above for measurements and observations.  Diagnosing physician: Lemar Livings MD  Electronically signed by Lemar Livings MD on 08/15/2022 at 2:31:24 PM.    Final    MR BRAIN WO CONTRAST  Result Date: 08/11/2022 CLINICAL DATA:  Neuro deficit, acute, stroke suspected EXAM: MRI HEAD WITHOUT CONTRAST TECHNIQUE: Multiplanar, multiecho pulse sequences of the brain and surrounding structures were obtained without intravenous contrast. COMPARISON:  Same day CT Head FINDINGS: Brain: No acute infarction, hemorrhage, hydrocephalus, extra-axial collection or mass lesion. Sequela of moderate chronic microvascular ischemic change. Vascular: Normal flow voids. Skull and upper cervical spine: Normal marrow signal. Sinuses/Orbits: Negative. Other: Likely small  subcutaneous cyst in the suboccipital scalp. IMPRESSION: Negative for an acute infarct. Electronically Signed   By: Lorenza Cambridge M.D.   On: 08/11/2022 18:43   CT Head Wo Contrast  Result Date: 08/11/2022 CLINICAL DATA:  Vertigo, central EXAM: CT HEAD WITHOUT CONTRAST TECHNIQUE: Contiguous axial images were obtained from the base of the skull through the vertex without intravenous contrast. RADIATION DOSE REDUCTION: This exam was performed according to the departmental dose-optimization program which includes automated exposure control, adjustment of the mA and/or kV according to patient size and/or use of iterative reconstruction technique. COMPARISON:  None Available. FINDINGS: Brain: No evidence of acute infarction, hemorrhage, hydrocephalus, extra-axial collection or mass lesion/mass effect. There is sequela of mild chronic microvascular ischemic change. Vascular: No hyperdense vessel or unexpected calcification. Skull: Normal. Negative  for fracture or focal lesion. Sinuses/Orbits: No middle ear or mastoid effusion. Paranasal sinuses are clear. Orbits are unremarkable. Other: None. IMPRESSION: No acute intracranial abnormality. No CT finding to explain vertigo. Electronically Signed   By: Lorenza Cambridge M.D.   On: 08/11/2022 14:42   DG Chest 1 View  Result Date: 08/11/2022 CLINICAL DATA:  Chest pain EXAM: CHEST  1 VIEW COMPARISON:  X-ray 12/14/2021 and CT scan FINDINGS: Underinflation. Enlarged cardiopericardial silhouette with vascular congestion centrally. No pneumothorax, effusion or consolidation. Eventration of the right hemidiaphragm. Film is under penetrated and rotated to the left as well as lordotic. IMPRESSION: Enlarged cardiopericardial silhouette with vascular congestion. Underinflation. Limited x-ray Electronically Signed   By: Karen Kays M.D.   On: 08/11/2022 14:40     Discharge Exam: Vitals:   08/17/22 0944 08/17/22 1505  BP: (!) 137/91 108/76  Pulse:  77  Resp:  18  Temp:  98.4 F (36.9 C)  SpO2:  98%   Vitals:   08/16/22 1953 08/17/22 0819 08/17/22 0944 08/17/22 1505  BP: (!) 168/96 (!) 151/117 (!) 137/91 108/76  Pulse: 74 79  77  Resp: 18   18  Temp: 97.6 F (36.4 C)   98.4 F (36.9 C)  TempSrc: Oral   Oral  SpO2: 100% 94%  98%  Weight:      Height:        General: Pt is alert, awake, not in acute distress Cardiovascular: RRR, S1/S2 +, no rubs, no gallops Respiratory: CTA bilaterally, no wheezing, no rhonchi Abdominal: Soft, NT, ND, bowel sounds + Extremities: no edema, no cyanosis    The results of significant diagnostics from this hospitalization (including imaging, microbiology, ancillary and laboratory) are listed below for reference.     Microbiology: No results found for this or any previous visit (from the past 240 hour(s)).   Labs: BNP (last 3 results) Recent Labs    08/14/22 0200  BNP 76.9   Basic Metabolic Panel: Recent Labs  Lab 08/11/22 1325 08/12/22 0358  08/14/22 0200 08/15/22 0332 08/16/22 0335  NA 138 136 133* 135 137  K 3.7 3.6 3.4* 3.6 3.6  CL 107 105 104 102 103  CO2 23 22 22 22 22   GLUCOSE 127* 114* 118* 93 105*  BUN 21 17 23  29* 30*  CREATININE 0.93 0.87 0.99 0.99 0.99  CALCIUM 10.6* 10.2 10.6* 10.9* 11.2*  MG 1.9  --   --   --   --    Liver Function Tests: Recent Labs  Lab 08/11/22 1325 08/12/22 0358 08/16/22 0335  AST 17 17 15   ALT 14 13 13   ALKPHOS 71 59 60  BILITOT 0.4 0.9 0.6  PROT 6.4* 5.9*  6.3*  ALBUMIN 3.3* 2.9* 3.0*   No results for input(s): "LIPASE", "AMYLASE" in the last 168 hours. No results for input(s): "AMMONIA" in the last 168 hours. CBC: Recent Labs  Lab 08/11/22 1325 08/12/22 0358  WBC 6.1 7.9  NEUTROABS 4.6  --   HGB 12.4* 12.4*  HCT 38.1* 36.4*  MCV 90.7 90.3  PLT 233 217   Cardiac Enzymes: No results for input(s): "CKTOTAL", "CKMB", "CKMBINDEX", "TROPONINI" in the last 168 hours. BNP: Invalid input(s): "POCBNP" CBG: No results for input(s): "GLUCAP" in the last 168 hours. D-Dimer No results for input(s): "DDIMER" in the last 72 hours. Hgb A1c No results for input(s): "HGBA1C" in the last 72 hours. Lipid Profile No results for input(s): "CHOL", "HDL", "LDLCALC", "TRIG", "CHOLHDL", "LDLDIRECT" in the last 72 hours. Thyroid function studies No results for input(s): "TSH", "T4TOTAL", "T3FREE", "THYROIDAB" in the last 72 hours.  Invalid input(s): "FREET3" Anemia work up No results for input(s): "VITAMINB12", "FOLATE", "FERRITIN", "TIBC", "IRON", "RETICCTPCT" in the last 72 hours. Urinalysis    Component Value Date/Time   COLORURINE STRAW (A) 08/11/2022 1310   APPEARANCEUR CLEAR 08/11/2022 1310   LABSPEC 1.004 (L) 08/11/2022 1310   PHURINE 7.0 08/11/2022 1310   GLUCOSEU NEGATIVE 08/11/2022 1310   GLUCOSEU NEGATIVE 06/29/2020 0959   HGBUR NEGATIVE 08/11/2022 1310   HGBUR trace-lysed 11/02/2009 0834   BILIRUBINUR NEGATIVE 08/11/2022 1310   BILIRUBINUR neg 07/10/2020 0917    KETONESUR NEGATIVE 08/11/2022 1310   PROTEINUR NEGATIVE 08/11/2022 1310   UROBILINOGEN 0.2 07/10/2020 0917   UROBILINOGEN 0.2 06/29/2020 0959   NITRITE NEGATIVE 08/11/2022 1310   LEUKOCYTESUR NEGATIVE 08/11/2022 1310   Sepsis Labs Recent Labs  Lab 08/11/22 1325 08/12/22 0358  WBC 6.1 7.9   Microbiology No results found for this or any previous visit (from the past 240 hour(s)).  FURTHER DISCHARGE INSTRUCTIONS:   Get Medicines reviewed and adjusted: Please take all your medications with you for your next visit with your Primary MD   Laboratory/radiological data: Please request your Primary MD to go over all hospital tests and procedure/radiological results at the follow up, please ask your Primary MD to get all Hospital records sent to his/her office.   In some cases, they will be blood work, cultures and biopsy results pending at the time of your discharge. Please request that your primary care M.D. goes through all the records of your hospital data and follows up on these results.   Also Note the following: If you experience worsening of your admission symptoms, develop shortness of breath, life threatening emergency, suicidal or homicidal thoughts you must seek medical attention immediately by calling 911 or calling your MD immediately  if symptoms less severe.   You must read complete instructions/literature along with all the possible adverse reactions/side effects for all the Medicines you take and that have been prescribed to you. Take any new Medicines after you have completely understood and accpet all the possible adverse reactions/side effects.    Do not drive when taking Pain medications or sleeping medications (Benzodaizepines)   Do not take more than prescribed Pain, Sleep and Anxiety Medications. It is not advisable to combine anxiety,sleep and pain medications without talking with your primary care practitioner   Special Instructions: If you have smoked or chewed  Tobacco  in the last 2 yrs please stop smoking, stop any regular Alcohol  and or any Recreational drug use.   Wear Seat belts while driving.   Please note: You were cared for by a  hospitalist during your hospital stay. Once you are discharged, your primary care physician will handle any further medical issues. Please note that NO REFILLS for any discharge medications will be authorized once you are discharged, as it is imperative that you return to your primary care physician (or establish a relationship with a primary care physician if you do not have one) for your post hospital discharge needs so that they can reassess your need for medications and monitor your lab values  Time coordinating discharge: Over 30 minutes  SIGNED:   Hughie Closs, MD  [facility] 08/17/2022, 4:26 PM *Please note that this is a verbal dictation therefore any spelling or grammatical errors are due to the "[employer] One" system interpretation. If 7PM-7AM, please contact night-coverage [email or web address]

## 2022-08-18 DIAGNOSIS — I4891 Unspecified atrial fibrillation: Secondary | ICD-10-CM | POA: Diagnosis not present

## 2022-08-18 DIAGNOSIS — I10 Essential (primary) hypertension: Secondary | ICD-10-CM | POA: Diagnosis not present

## 2022-08-18 DIAGNOSIS — G4733 Obstructive sleep apnea (adult) (pediatric): Secondary | ICD-10-CM | POA: Diagnosis not present

## 2022-08-18 DIAGNOSIS — E785 Hyperlipidemia, unspecified: Secondary | ICD-10-CM | POA: Diagnosis not present

## 2022-08-19 DIAGNOSIS — I480 Paroxysmal atrial fibrillation: Secondary | ICD-10-CM | POA: Diagnosis not present

## 2022-08-19 DIAGNOSIS — I1A0 Resistant hypertension: Secondary | ICD-10-CM | POA: Diagnosis not present

## 2022-08-19 DIAGNOSIS — R2681 Unsteadiness on feet: Secondary | ICD-10-CM | POA: Diagnosis not present

## 2022-08-19 DIAGNOSIS — R2689 Other abnormalities of gait and mobility: Secondary | ICD-10-CM | POA: Diagnosis not present

## 2022-08-19 DIAGNOSIS — R531 Weakness: Secondary | ICD-10-CM | POA: Diagnosis not present

## 2022-08-23 LAB — ALDOSTERONE + RENIN ACTIVITY W/ RATIO
ALDO / PRA Ratio: 8.4 (ref 0.0–30.0)
Aldosterone: 4.8 ng/dL (ref 0.0–30.0)
PRA LC/MS/MS: 0.573 ng/mL/hr (ref 0.167–5.380)

## 2022-08-24 ENCOUNTER — Telehealth: Payer: Self-pay

## 2022-08-24 ENCOUNTER — Ambulatory Visit: Payer: PPO

## 2022-08-24 DIAGNOSIS — I480 Paroxysmal atrial fibrillation: Secondary | ICD-10-CM | POA: Diagnosis not present

## 2022-08-24 DIAGNOSIS — R2689 Other abnormalities of gait and mobility: Secondary | ICD-10-CM | POA: Diagnosis not present

## 2022-08-24 DIAGNOSIS — R2681 Unsteadiness on feet: Secondary | ICD-10-CM | POA: Diagnosis not present

## 2022-08-24 DIAGNOSIS — R531 Weakness: Secondary | ICD-10-CM | POA: Diagnosis not present

## 2022-08-24 DIAGNOSIS — I1A0 Resistant hypertension: Secondary | ICD-10-CM | POA: Diagnosis not present

## 2022-08-24 NOTE — Telephone Encounter (Signed)
Pt's coumadin clinic appt canceled. Called and spoke with pt's nephew, Onalee Hua. Pt was recently discharged from hospital and placed in rehab at [facility]. Onalee Hua stated he would call Coumadin Clinic to scheduled Coumadin Clinic appt for pt as soon as he is discharged from the facility.

## 2022-08-25 DIAGNOSIS — I2510 Atherosclerotic heart disease of native coronary artery without angina pectoris: Secondary | ICD-10-CM | POA: Diagnosis not present

## 2022-08-25 DIAGNOSIS — I10 Essential (primary) hypertension: Secondary | ICD-10-CM | POA: Diagnosis not present

## 2022-08-25 DIAGNOSIS — G4733 Obstructive sleep apnea (adult) (pediatric): Secondary | ICD-10-CM | POA: Diagnosis not present

## 2022-08-25 DIAGNOSIS — E785 Hyperlipidemia, unspecified: Secondary | ICD-10-CM | POA: Diagnosis not present

## 2022-08-26 DIAGNOSIS — E785 Hyperlipidemia, unspecified: Secondary | ICD-10-CM | POA: Diagnosis not present

## 2022-08-26 DIAGNOSIS — I10 Essential (primary) hypertension: Secondary | ICD-10-CM | POA: Diagnosis not present

## 2022-08-26 DIAGNOSIS — G4733 Obstructive sleep apnea (adult) (pediatric): Secondary | ICD-10-CM | POA: Diagnosis not present

## 2022-08-26 DIAGNOSIS — I4891 Unspecified atrial fibrillation: Secondary | ICD-10-CM | POA: Diagnosis not present

## 2022-08-30 DIAGNOSIS — I1A0 Resistant hypertension: Secondary | ICD-10-CM | POA: Diagnosis not present

## 2022-08-30 DIAGNOSIS — I350 Nonrheumatic aortic (valve) stenosis: Secondary | ICD-10-CM | POA: Diagnosis not present

## 2022-08-30 DIAGNOSIS — I951 Orthostatic hypotension: Secondary | ICD-10-CM | POA: Diagnosis not present

## 2022-08-30 DIAGNOSIS — I10 Essential (primary) hypertension: Secondary | ICD-10-CM | POA: Diagnosis not present

## 2022-08-30 DIAGNOSIS — Z7901 Long term (current) use of anticoagulants: Secondary | ICD-10-CM | POA: Diagnosis not present

## 2022-08-30 DIAGNOSIS — I4821 Permanent atrial fibrillation: Secondary | ICD-10-CM | POA: Diagnosis not present

## 2022-08-30 DIAGNOSIS — Z9181 History of falling: Secondary | ICD-10-CM | POA: Diagnosis not present

## 2022-08-30 DIAGNOSIS — G4733 Obstructive sleep apnea (adult) (pediatric): Secondary | ICD-10-CM | POA: Diagnosis not present

## 2022-08-31 ENCOUNTER — Ambulatory Visit: Payer: PPO | Attending: Internal Medicine | Admitting: Internal Medicine

## 2022-08-31 ENCOUNTER — Encounter: Payer: Self-pay | Admitting: Internal Medicine

## 2022-08-31 VITALS — BP 159/96 | HR 66 | Ht 70.0 in | Wt 234.8 lb

## 2022-08-31 DIAGNOSIS — I10 Essential (primary) hypertension: Secondary | ICD-10-CM

## 2022-08-31 DIAGNOSIS — I4891 Unspecified atrial fibrillation: Secondary | ICD-10-CM | POA: Diagnosis not present

## 2022-08-31 NOTE — Progress Notes (Signed)
Cardiology Office Note   Date:  08/31/2022   ID:  [name], DOB 1950/07/18, MRN [id number]  PCP:  [name], MD  Cardiologist:   Blake Pates, MD   Patient presents for follow up of AS, MR     History of Present Illness: [name] is a 72 y.o. male with a history of permanent atrial fibrillation, mitral regurgitation, aortic stenosis, HTN, bradycardia,  HL, morbid obesity and OSA.   I saw him a few years ago in clinic   He was last seen by Wende Mott in cardiology in November 2022. The pt lives at home   Gets a lot of help from his nephrew.  Not very active   Denies CP   Says his breathing is OK   Denies palpitations   Denies dizziness BP at home 120s to 130s/ systolic      I saw the pt in Aug 2023   He was admitted to [facility] in October 2023 with weakness, DOE   Troponin elevated (peak 334)   Cardiology consulted   Recomm R and L heart cathn   On 12/17/21 it showed   Normal filling pressures.   LHC showed severe multivessel dz    RCA nondominant   LAD with moderate dz.     LCx with OM1 with ulcerated plaque    OM2 with occlusion.      Given locatoin plan was for medical Rx   If failed hen intervention     The pt was recently hospitalized with dizziness  BP found t obe significantly elevated (was normal at Dr Claris Che office the previous day)  MRI and CT neg for CVA  Cardiology consulted   Amlodipine added  Renal artery duplex negative for RAS.   Continued on other meds   the pt declined amlodipine at d/c   had a bad experience with hypotension in the past   Since d/c he says he has been doing OK   Getting around apartment   VNA coming   BP at home 120s to 140s    This is alos what VNA gets   He denies CP  No dizziness   Breathing is OK   Current Meds  Medication Sig   acetaminophen (TYLENOL) 325 MG tablet Take 650 mg by mouth daily as needed (pain).   Bempedoic Acid-Ezetimibe (NEXLIZET) 180-10 MG TABS Take 1 tablet by mouth daily. (Stop taking ezetimibe when you start taking  Nexlizet)   carvedilol (COREG) 12.5 MG tablet Take 1 tablet (12.5 mg total) by mouth 2 (two) times daily with a meal.   cholecalciferol (CHOLECALCIFEROL) 25 MCG tablet Take 1 tablet (1,000 Units total) by mouth daily.   cloNIDine (CATAPRES) 0.2 MG tablet Take 1 tablet (0.2 mg total) by mouth 2 (two) times daily.   doxazosin (CARDURA) 4 MG tablet Take 1 tablet (4 mg total) by mouth daily.   hydrochlorothiazide (MICROZIDE) 12.5 MG capsule Take 1 capsule (12.5 mg total) by mouth daily.   losartan (COZAAR) 100 MG tablet Take 1 tablet (100 mg total) by mouth daily.   lubiprostone (AMITIZA) 24 MCG capsule Take 1 capsule (24 mcg total) by mouth 2 (two) times daily.   OMEGA-3 FATTY ACIDS PO Take 1 capsule by mouth in the morning and at bedtime.   warfarin (COUMADIN) 5 MG tablet Take 1.5 tablets (7.5 mg total) by mouth daily. Patient takes 7.5 mg (1.5 tabs) MWF and 5 mg (1 tab) all other days  Allergies:   Statins and Penicillins   Past Medical History:  Diagnosis Date   Adenomatous colon polyp 07/2003   Heart valve problem    Dr [name]   Hypertension    Mitral valve disorders(424.0)    Nonrheumatic aortic valve stenosis 01/14/2021   Echo 9/22: EF 60-65, small pericardial effusion, mild AS (mean gradient 16 mmHg), trivial AI, mild MR (MR is eccentric - may be underestimated) // Echo 10/2021: EF 60-65, mod calcification of AV, trivial AI, mod-severe AS (DI 0.25, V-max 350 cm/s, mean 28.8), no RWMA, NL RVSF, severe LAE, mild RAE, small effusion w/o tamponade, ?mild-moderate MR (difficult due to eccentric jet)   Nonrheumatic mitral valve regurgitation 01/14/2021   Echo 10/2021: EF 60-65, moderate calcification of the aortic valve, trivial AI, moderate to severe aortic stenosis (DI 0.25, V-max 350 cm/s, mean gradient 28.8), no RWMA, normal RVSF, severe LAE, mild RAE, small pericardial effusion without tamponade, suspect mild-moderate MR (difficult to qualify degree due to eccentric jet with coanda  affect)   Obesity    OSA (obstructive sleep apnea)    cpap   Other and unspecified hyperlipidemia    Permanent atrial fibrillation (HCC)    Zio Monitor 5/22: AFib - Avg HR 55; slowest 32; longest pause 3.9 sec; occ PVCs, 4 beat NSVT   Sleep apnea     Past Surgical History:  Procedure Laterality Date   COLONOSCOPY  06/09/2015   per Dr. Russella Dar, adenomatous polyp, repeat in 7 yrs   FOOT SURGERY  at age 39   LUMBAR LAMINECTOMY/DECOMPRESSION MICRODISCECTOMY N/A 11/21/2012   Procedure: Lumbar Three to Lumbar Five Laminectomy;  Surgeon: Karn Cassis, MD;  Location: [facility];  Service: Neurosurgery;  Laterality: N/A;  Lumbar Three to Lumbar Five Laminectomy   RIGHT/LEFT HEART CATH AND CORONARY ANGIOGRAPHY N/A 12/17/2021   Procedure: RIGHT/LEFT HEART CATH AND CORONARY ANGIOGRAPHY;  Surgeon: Marykay Lex, MD;  Location: [facility] INVASIVE CV LAB;  Service: Cardiovascular;  Laterality: N/A;     Social History:  The patient  reports that he has never smoked. He has never used smokeless tobacco. He reports that he does not drink alcohol and does not use drugs.   Family History:  The patient's family history includes Alzheimer's disease in his maternal aunt and mother; Heart disease in his father; Hypertension in his father; Stroke in his father.    ROS:  Please see the history of present illness. All other systems are reviewed and  Negative to the above problem except as noted.    PHYSICAL EXAM: VS:  BP (!) 159/96   Pulse 66   Ht 5\' 10"  (1.778 m)   Wt 234 lb 12.8 oz (106.5 kg)   SpO2 98%   BMI 33.69 kg/m   BP on my check 160/86  GEN: Morbidly obese 72 yo  in no acute distress   Examined in wheelchair HEENT: normal  Neck: no JVD, carotid bruits,  Cardiac: RRR; Gr III/VI systolic murmur at base  No significant edema     Respiratory:  clear to auscultation   EKG:  EKG is not ordered today  Cardiac cath  12/2021    Prox LAD to Dist LAD lesion is 55% stenosed.   Mid RCA to Dist  RCA lesion is 100% stenosed with 100% stenosed side branch in Acute Mrg.   1st Mrg lesion is 99% stenosed with 70% stenosed side branch in Lat 1st Mrg.   Dist Cx lesion is 70% stenosed with 100% stenosed side  branch in 2nd Mrg.   LV end diastolic pressure is moderately elevated.   Hemodynamic findings consistent with mild pulmonary hypertension.   There is mild aortic valve stenosis.   POST-OPERATIVE DIAGNOSIS:  Relatively Normal Right Heart Cath Numbers: RAP mean 9 mmHg, RVP-EDP 37/4-9 mmHg PAP-mean 35/18-26 mmHg; PCWP 19/26 mmHg - 22 mmHg LVEDP-EDP 165/8-18 mmHg; AOP-MAP 150/81-107 mmHg Ao sat 96%, PA sat 65%. ` Cardiac Output 5.94, Cardiac Index 2.56 (mildly reduced Severe multivessel CAD: Small nondominant RCA (<1.5 mm) -100% CTO of the mid vessel Diffusely diseased 40 to 60% LAD from proximal to apical with no obvious focal lesion Large-caliber mostly dominant LCx with 2 major OM branches, 2 PL's and a PDA OM1 has segmental irregular hazy ulcerated appearing likely calcified lesion just proximal to the bifurcation into 2 subbranches (not favorable for PCI, but could be attempted if necessary) OM 2 is CTO flush occlusion (cannot tell if it actually comes off of OM1 or comes off of the LCx as it fills via retrograde filling but not all the way to the parent vessel The AV groove circumflex after occluded OM2 has an eccentric appearing 70% stenosis then normalizes prior to the final 3 branches (LPL 1 LPL 2 and PDA   PLAN OF CARE:  Return to nursing unit for ongoing care.  Okay to restart heparin or Lovenox in the morning 12/18/2021; can dose warfarin tonight Aggressive guideline directed medical therapy for A-fib, CAD Monitor for signs of concerning symptoms for angina or worsening CHF, would otherwise treat the OM and LCx lesions medically for now. Cath films were reviewed with Dr. [place] to make decision of attempted PCI versus medical therapy.  His recommendation independent mine was  also to recommend medical management based on the the difficulty of intervention of the OM branch which is probably the culprit based on the downstream bifurcation.  If he were to have significant symptoms, this could potentially be attempted but will be difficult.  The LCx does have a lesion which may or may not be flow-limiting.  It could also be attempted, if symptoms warranted.   Echo  12/2021  Left ventricular ejection fraction, by estimation, is 55 to 60%. The left ventricle has normal function. The left ventricle has no regional wall motion abnormalities. Left ventricular diastolic parameters are indeterminate. 1. Right ventricular systolic function is normal. The right ventricular size is normal. Tricuspid regurgitation signal is inadequate for assessing PA pressure. 2. 3. Left atrial size was moderately dilated. The mitral valve was not well visualized. Trivial mitral valve regurgitation (MR was reported as worse on last echo but mitral valve poorly visualized on this echo). No evidence of mitral stenosis. 4. The aortic valve is tricuspid. There is moderate calcification of the aortic valve. Aortic valve regurgitation is mild. Moderate aortic valve stenosis. Aortic valve mean gradient measures 25.0 mmHg. Calculated AVA 1.5 cm^2. 5. Aortic dilatation noted. There is mild dilatation of the aortic root and of the ascending aorta, measuring 40 mm. 6. The inferior vena cava is dilated in size with >50% respiratory variability, suggesting right atrial pressure of 8 mmHg. 7. 8. The patient was in atrial fibrillation.   Lipid Panel    Component Value Date/Time   CHOL 111 08/10/2022 0838   CHOL 159 01/07/2022 1016   TRIG 107.0 08/10/2022 0838   HDL 28.60 (L) 08/10/2022 0838   HDL 40 01/07/2022 1016   CHOLHDL 4 08/10/2022 0838   VLDL 21.4 08/10/2022 0838   LDLCALC 61 08/10/2022 1610  LDLCALC 100 (H) 01/07/2022 1016   LDLDIRECT 168.0 03/25/2014 0844      Wt Readings from  Last 3 Encounters:  08/31/22 234 lb 12.8 oz (106.5 kg)  08/11/22 246 lb 14.6 oz (112 kg)  08/10/22 248 lb (112.5 kg)      ASSESSMENT AND PLAN:  1 HTN  BP is elevated  Better at home even with nurse's cuff  I will not adjust meds today  Keep track at home   Take cuff with him when he sees Dr Clent Ridges   Could consider low dose (hydralazine 5 mg tid) if BP remains high    2  CAD  Severe CAD noted above Remains asymptomatic   Follow   3  Aortic stenosis  Moderate on echo in Oct 2023  Repeat next fall      2  Mitral regurgitation Mild on last echo     4  Atrial fibrillation   Permanent   Keep on rate control and coumadin    5   LIpids   Last lipids  LDL 61  HDL 29  Trig 107  Keep on Nexilet   Current medicines are reviewed at length with the patient today.  The patient does not have concerns regarding medicines.  Signed, Blake Pates, MD  08/31/2022 4:47 PM    [facility] [address] [place], [place], [place]  [address] Phone: [phone]; Fax: [phone]

## 2022-08-31 NOTE — Patient Instructions (Signed)
Medication Instructions:  Your physician recommends that you continue on your current medications as directed. Please refer to the Current Medication list given to you today.  *If you need a refill on your cardiac medications before your next appointment, please call your pharmacy*  Lab Work: None ordered today.  Testing/Procedures: Your physician has requested that you have an echocardiogram in November 2024. Echocardiography is a painless test that uses sound waves to create images of your heart. It provides your doctor with information about the size and shape of your heart and how well your heart's chambers and valves are working. This procedure takes approximately one hour. There are no restrictions for this procedure. Please do NOT wear cologne, perfume, aftershave, or lotions (deodorant is allowed). Please arrive 15 minutes prior to your appointment time.  Follow-Up: At [facility], you and your health needs are our priority.  As part of our continuing mission to provide you with exceptional heart care, we have created designated Provider Care Teams.  These Care Teams include your primary Cardiologist (physician) and Advanced Practice Providers (APPs -  Physician Assistants and Nurse Practitioners) who all work together to provide you with the care you need, when you need it.  Your next appointment:   5 month(s) November 2024  The format for your next appointment:   In Person  Provider:   [name], MD {

## 2022-09-09 ENCOUNTER — Encounter: Payer: Self-pay | Admitting: Family Medicine

## 2022-09-09 ENCOUNTER — Ambulatory Visit (INDEPENDENT_AMBULATORY_CARE_PROVIDER_SITE_OTHER): Payer: PPO | Admitting: Family Medicine

## 2022-09-09 VITALS — BP 102/72 | HR 78 | Temp 98.2°F | Wt 239.0 lb

## 2022-09-09 DIAGNOSIS — I1A0 Resistant hypertension: Secondary | ICD-10-CM

## 2022-09-09 DIAGNOSIS — I4821 Permanent atrial fibrillation: Secondary | ICD-10-CM

## 2022-09-09 DIAGNOSIS — R42 Dizziness and giddiness: Secondary | ICD-10-CM

## 2022-09-09 MED ORDER — AMLODIPINE BESYLATE 5 MG PO TABS: 5.0000 mg | ORAL_TABLET | Freq: Every day | ORAL | 0 refills | Status: DC

## 2022-09-09 NOTE — Progress Notes (Signed)
   Subjective:    Patient ID: [name], male    DOB: [birth year], 72 y.o.   MRN: [id number]  HPI Here with his nephew to follow up a hospital stay from 08-11-22 to 08-12-22 for vertigo and high BP. He developed vertigo at home while on the toilet, and this was likely caused by him straining to move his bowels. He is always dealing with constipation despite taking Amitiza and stool softeners. On admission his labs were normal including a creatinine of 0.93. A head CT and a brain MRI were unremarkable. A renal artery duplex scan was negative. His systolic pressures at the hospital ranged from the 150's to the 190's. They gave him a 30 day supply of Amlodipine 5 mg to add to his current medications, but he has not started this because he is afraid it would drop the BP too low. This happened once in the past, but as we look back he was taking the Amlodipine in the mornings with all his other BP meds. The systolic pressure would then drop to around 100 so he stopped taking it. He then saw Dr. Tenny Craw on 08-31-22, and she suggested he stay on the same regimen but suggested using Hydralazine as needed (which he has not taken). He has had no further vertigo after he left the hospital that day.    Review of Systems  Constitutional: Negative.   Respiratory: Negative.    Cardiovascular: Negative.   Gastrointestinal: Negative.   Neurological:  Negative for dizziness, light-headedness and headaches.       Objective:   Physical Exam Constitutional:      Comments: In a wheelchair   Cardiovascular:     Rate and Rhythm: Normal rate and regular rhythm.     Pulses: Normal pulses.     Heart sounds: Normal heart sounds.  Pulmonary:     Effort: Pulmonary effort is normal.     Breath sounds: Normal breath sounds.  Musculoskeletal:     Comments: 1+ edema in both ankles.   Neurological:     Mental Status: He is alert.           Assessment & Plan:  His vertigo has resolved. His constipation is fairly well  managed if he drinks plenty of water. His HTN seems to be well controlled in the mornings, but the BP tends to go up in the late afternoons or evenings. We agreed that he will start back on Amlodipine 5 mg daily, but he will take this in the early afternoons between 2 and 3 PM. Hopefully this can prevent the evening spikes of BP. They will report back in one week. We spent a total of (35   ) minutes reviewing records and discussing these issues.  Gershon Crane, MD

## 2022-09-14 ENCOUNTER — Encounter: Payer: Self-pay | Admitting: Family Medicine

## 2022-09-16 NOTE — Telephone Encounter (Signed)
Noted  

## 2022-09-17 ENCOUNTER — Encounter: Payer: Self-pay | Admitting: Internal Medicine

## 2022-09-19 ENCOUNTER — Other Ambulatory Visit (HOSPITAL_COMMUNITY): Payer: Self-pay

## 2022-09-19 ENCOUNTER — Telehealth: Payer: Self-pay

## 2022-09-19 NOTE — Telephone Encounter (Signed)
I understand. Let's hold the Amlodipine for now and see what the BP does over the next month

## 2022-09-19 NOTE — Telephone Encounter (Signed)
Please note that A P/A has been submitted via Email/fax to [employer] for the patients ''Nexlizet 180-10mg ''.  Status is pending     Request:  [id number]

## 2022-09-20 NOTE — Telephone Encounter (Signed)
Pharmacy Patient Advocate Encounter  Received notification from [employer]/ [employer] Advance that Prior Authorization for Nexlizet 180mg -10mg  has been APPROVED from 7.15.24 to 7.15.25.Marland Kitchen  PA #/Case ID/Reference #: [id number]

## 2022-09-21 ENCOUNTER — Ambulatory Visit: Payer: PPO | Attending: Cardiology

## 2022-09-21 DIAGNOSIS — I4891 Unspecified atrial fibrillation: Secondary | ICD-10-CM | POA: Diagnosis not present

## 2022-09-21 DIAGNOSIS — Z5181 Encounter for therapeutic drug level monitoring: Secondary | ICD-10-CM

## 2022-09-21 LAB — POCT INR: INR: 3.9 — AB (ref 2.0–3.0)

## 2022-09-21 NOTE — Patient Instructions (Signed)
Description   HOLD today's dose and then CONTINUE TO TAKE WARFARIN 1 TABLET DAILY, EXCEPT 1.5 TABLETS ON MONDAY, WEDNESDAY AND FRIDAY. Recheck INR in 2 weeks.  Anticoagulation Clinic [phone]

## 2022-10-05 ENCOUNTER — Ambulatory Visit: Payer: PPO | Admitting: *Deleted

## 2022-10-05 DIAGNOSIS — Z5181 Encounter for therapeutic drug level monitoring: Secondary | ICD-10-CM

## 2022-10-05 DIAGNOSIS — I4891 Unspecified atrial fibrillation: Secondary | ICD-10-CM

## 2022-10-05 LAB — POCT INR: INR: 2.2 (ref 2.0–3.0)

## 2022-10-05 NOTE — Patient Instructions (Signed)
Description   CONTINUE TO TAKE WARFARIN 1 TABLET DAILY, EXCEPT 1.5 TABLETS ON MONDAY, WEDNESDAY AND FRIDAY. Recheck INR in 4 weeks.  Anticoagulation Clinic [phone]

## 2022-10-12 NOTE — Progress Notes (Signed)
Pharmacy Quality Measure Review  This patient is appearing on a report for being at risk of failing the adherence measure for hypertension (ACEi/ARB) medications this calendar year.   Medication: losartan 100 mg Last fill date: 09/07/22 for 90 day supply  Insurance report was not up to date. No action needed at this time.   Nils Pyle, PharmD PGY1 Pharmacy Resident

## 2022-10-17 ENCOUNTER — Emergency Department (HOSPITAL_COMMUNITY): Payer: PPO

## 2022-10-17 ENCOUNTER — Other Ambulatory Visit: Payer: Self-pay

## 2022-10-17 ENCOUNTER — Inpatient Hospital Stay (HOSPITAL_COMMUNITY)
Admission: EM | Admit: 2022-10-17 | Discharge: 2022-10-30 | DRG: 871 | Disposition: A | Discharge status: Skilled Nursing Facility | Payer: PPO | Attending: Internal Medicine | Admitting: Internal Medicine

## 2022-10-17 ENCOUNTER — Encounter (HOSPITAL_COMMUNITY): Payer: Self-pay

## 2022-10-17 DIAGNOSIS — R0989 Other specified symptoms and signs involving the circulatory and respiratory systems: Secondary | ICD-10-CM | POA: Diagnosis not present

## 2022-10-17 DIAGNOSIS — R6520 Severe sepsis without septic shock: Secondary | ICD-10-CM | POA: Diagnosis not present

## 2022-10-17 DIAGNOSIS — R059 Cough, unspecified: Secondary | ICD-10-CM | POA: Diagnosis not present

## 2022-10-17 DIAGNOSIS — Z66 Do not resuscitate: Secondary | ICD-10-CM | POA: Diagnosis not present

## 2022-10-17 DIAGNOSIS — D638 Anemia in other chronic diseases classified elsewhere: Secondary | ICD-10-CM | POA: Diagnosis not present

## 2022-10-17 DIAGNOSIS — J189 Pneumonia, unspecified organism: Secondary | ICD-10-CM | POA: Diagnosis not present

## 2022-10-17 DIAGNOSIS — I3139 Other pericardial effusion (noninflammatory): Secondary | ICD-10-CM | POA: Diagnosis present

## 2022-10-17 DIAGNOSIS — L03115 Cellulitis of right lower limb: Secondary | ICD-10-CM | POA: Diagnosis present

## 2022-10-17 DIAGNOSIS — I214 Non-ST elevation (NSTEMI) myocardial infarction: Secondary | ICD-10-CM | POA: Diagnosis not present

## 2022-10-17 DIAGNOSIS — W19XXXA Unspecified fall, initial encounter: Secondary | ICD-10-CM | POA: Diagnosis present

## 2022-10-17 DIAGNOSIS — Z713 Dietary counseling and surveillance: Secondary | ICD-10-CM

## 2022-10-17 DIAGNOSIS — I340 Nonrheumatic mitral (valve) insufficiency: Secondary | ICD-10-CM | POA: Diagnosis not present

## 2022-10-17 DIAGNOSIS — J154 Pneumonia due to other streptococci: Secondary | ICD-10-CM | POA: Diagnosis present

## 2022-10-17 DIAGNOSIS — I4891 Unspecified atrial fibrillation: Secondary | ICD-10-CM | POA: Diagnosis present

## 2022-10-17 DIAGNOSIS — I10 Essential (primary) hypertension: Secondary | ICD-10-CM | POA: Diagnosis not present

## 2022-10-17 DIAGNOSIS — I77819 Aortic ectasia, unspecified site: Secondary | ICD-10-CM

## 2022-10-17 DIAGNOSIS — G4733 Obstructive sleep apnea (adult) (pediatric): Secondary | ICD-10-CM | POA: Diagnosis present

## 2022-10-17 DIAGNOSIS — R6521 Severe sepsis with septic shock: Secondary | ICD-10-CM | POA: Diagnosis not present

## 2022-10-17 DIAGNOSIS — E876 Hypokalemia: Secondary | ICD-10-CM | POA: Diagnosis present

## 2022-10-17 DIAGNOSIS — E785 Hyperlipidemia, unspecified: Secondary | ICD-10-CM | POA: Diagnosis present

## 2022-10-17 DIAGNOSIS — I080 Rheumatic disorders of both mitral and aortic valves: Secondary | ICD-10-CM | POA: Diagnosis present

## 2022-10-17 DIAGNOSIS — E669 Obesity, unspecified: Secondary | ICD-10-CM | POA: Diagnosis not present

## 2022-10-17 DIAGNOSIS — Z79899 Other long term (current) drug therapy: Secondary | ICD-10-CM

## 2022-10-17 DIAGNOSIS — I2510 Atherosclerotic heart disease of native coronary artery without angina pectoris: Secondary | ICD-10-CM

## 2022-10-17 DIAGNOSIS — Z8249 Family history of ischemic heart disease and other diseases of the circulatory system: Secondary | ICD-10-CM

## 2022-10-17 DIAGNOSIS — E871 Hypo-osmolality and hyponatremia: Secondary | ICD-10-CM | POA: Diagnosis present

## 2022-10-17 DIAGNOSIS — A401 Sepsis due to streptococcus, group B: Secondary | ICD-10-CM | POA: Diagnosis not present

## 2022-10-17 DIAGNOSIS — E872 Acidosis, unspecified: Secondary | ICD-10-CM | POA: Diagnosis present

## 2022-10-17 DIAGNOSIS — I4821 Permanent atrial fibrillation: Secondary | ICD-10-CM | POA: Diagnosis not present

## 2022-10-17 DIAGNOSIS — Z7189 Other specified counseling: Secondary | ICD-10-CM | POA: Diagnosis not present

## 2022-10-17 DIAGNOSIS — E8809 Other disorders of plasma-protein metabolism, not elsewhere classified: Secondary | ICD-10-CM | POA: Diagnosis present

## 2022-10-17 DIAGNOSIS — I77810 Thoracic aortic ectasia: Secondary | ICD-10-CM | POA: Diagnosis not present

## 2022-10-17 DIAGNOSIS — Z515 Encounter for palliative care: Secondary | ICD-10-CM | POA: Diagnosis not present

## 2022-10-17 DIAGNOSIS — Z1152 Encounter for screening for COVID-19: Secondary | ICD-10-CM

## 2022-10-17 DIAGNOSIS — Z6834 Body mass index (BMI) 34.0-34.9, adult: Secondary | ICD-10-CM

## 2022-10-17 DIAGNOSIS — R531 Weakness: Secondary | ICD-10-CM | POA: Diagnosis not present

## 2022-10-17 DIAGNOSIS — R0902 Hypoxemia: Secondary | ICD-10-CM | POA: Diagnosis not present

## 2022-10-17 DIAGNOSIS — I872 Venous insufficiency (chronic) (peripheral): Secondary | ICD-10-CM | POA: Diagnosis not present

## 2022-10-17 DIAGNOSIS — K047 Periapical abscess without sinus: Secondary | ICD-10-CM | POA: Diagnosis present

## 2022-10-17 DIAGNOSIS — I700 Atherosclerosis of aorta: Secondary | ICD-10-CM | POA: Diagnosis not present

## 2022-10-17 DIAGNOSIS — R7881 Bacteremia: Secondary | ICD-10-CM

## 2022-10-17 DIAGNOSIS — K029 Dental caries, unspecified: Secondary | ICD-10-CM | POA: Diagnosis not present

## 2022-10-17 DIAGNOSIS — J90 Pleural effusion, not elsewhere classified: Secondary | ICD-10-CM | POA: Diagnosis not present

## 2022-10-17 DIAGNOSIS — N179 Acute kidney failure, unspecified: Secondary | ICD-10-CM | POA: Diagnosis not present

## 2022-10-17 DIAGNOSIS — R5381 Other malaise: Secondary | ICD-10-CM | POA: Diagnosis present

## 2022-10-17 DIAGNOSIS — K5909 Other constipation: Secondary | ICD-10-CM | POA: Diagnosis not present

## 2022-10-17 DIAGNOSIS — G8929 Other chronic pain: Secondary | ICD-10-CM | POA: Diagnosis present

## 2022-10-17 DIAGNOSIS — I350 Nonrheumatic aortic (valve) stenosis: Secondary | ICD-10-CM | POA: Diagnosis not present

## 2022-10-17 DIAGNOSIS — I517 Cardiomegaly: Secondary | ICD-10-CM | POA: Diagnosis not present

## 2022-10-17 DIAGNOSIS — R0602 Shortness of breath: Secondary | ICD-10-CM | POA: Diagnosis not present

## 2022-10-17 DIAGNOSIS — Z7901 Long term (current) use of anticoagulants: Secondary | ICD-10-CM

## 2022-10-17 DIAGNOSIS — Z888 Allergy status to other drugs, medicaments and biological substances status: Secondary | ICD-10-CM

## 2022-10-17 DIAGNOSIS — Z7401 Bed confinement status: Secondary | ICD-10-CM | POA: Diagnosis not present

## 2022-10-17 DIAGNOSIS — M549 Dorsalgia, unspecified: Secondary | ICD-10-CM | POA: Diagnosis present

## 2022-10-17 DIAGNOSIS — I2583 Coronary atherosclerosis due to lipid rich plaque: Secondary | ICD-10-CM | POA: Diagnosis not present

## 2022-10-17 DIAGNOSIS — L03113 Cellulitis of right upper limb: Secondary | ICD-10-CM | POA: Diagnosis not present

## 2022-10-17 DIAGNOSIS — Z880 Allergy status to penicillin: Secondary | ICD-10-CM

## 2022-10-17 DIAGNOSIS — R8271 Bacteriuria: Secondary | ICD-10-CM | POA: Diagnosis not present

## 2022-10-17 LAB — CBC WITH DIFFERENTIAL/PLATELET
Abs Immature Granulocytes: 0.15 10*3/uL — ABNORMAL HIGH (ref 0.00–0.07)
Basophils Absolute: 0 10*3/uL (ref 0.0–0.1)
Basophils Relative: 0 %
Eosinophils Absolute: 0 10*3/uL (ref 0.0–0.5)
Eosinophils Relative: 0 %
HCT: 35.7 % — ABNORMAL LOW (ref 39.0–52.0)
Hemoglobin: 11.9 g/dL — ABNORMAL LOW (ref 13.0–17.0)
Immature Granulocytes: 1 %
Lymphocytes Relative: 3 %
Lymphs Abs: 0.7 10*3/uL (ref 0.7–4.0)
MCH: 30.9 pg (ref 26.0–34.0)
MCHC: 33.3 g/dL (ref 30.0–36.0)
MCV: 92.7 fL (ref 80.0–100.0)
Monocytes Absolute: 0.6 10*3/uL (ref 0.1–1.0)
Monocytes Relative: 3 %
Neutro Abs: 21 10*3/uL — ABNORMAL HIGH (ref 1.7–7.7)
Neutrophils Relative %: 93 %
Platelets: 257 10*3/uL (ref 150–400)
RBC: 3.85 MIL/uL — ABNORMAL LOW (ref 4.22–5.81)
RDW: 14.7 % (ref 11.5–15.5)
WBC: 22.5 10*3/uL — ABNORMAL HIGH (ref 4.0–10.5)
nRBC: 0 % (ref 0.0–0.2)

## 2022-10-17 LAB — COMPREHENSIVE METABOLIC PANEL
ALT: 24 U/L (ref 0–44)
AST: 60 U/L — ABNORMAL HIGH (ref 15–41)
Albumin: 2.9 g/dL — ABNORMAL LOW (ref 3.5–5.0)
Alkaline Phosphatase: 61 U/L (ref 38–126)
Anion gap: 9 (ref 5–15)
BUN: 24 mg/dL — ABNORMAL HIGH (ref 8–23)
CO2: 24 mmol/L (ref 22–32)
Calcium: 10.7 mg/dL — ABNORMAL HIGH (ref 8.9–10.3)
Chloride: 100 mmol/L (ref 98–111)
Creatinine, Ser: 1.09 mg/dL (ref 0.61–1.24)
GFR, Estimated: >60 mL/min (ref >60)
Glucose, Bld: 124 mg/dL — ABNORMAL HIGH (ref 70–99)
Potassium: 3.4 mmol/L — ABNORMAL LOW (ref 3.5–5.1)
Sodium: 133 mmol/L — ABNORMAL LOW (ref 135–145)
Total Bilirubin: 0.7 mg/dL (ref 0.3–1.2)
Total Protein: 6.5 g/dL (ref 6.5–8.1)

## 2022-10-17 LAB — PROTIME-INR
INR: 3.8 — ABNORMAL HIGH (ref 0.8–1.2)
Prothrombin Time: 38.1 seconds — ABNORMAL HIGH (ref 11.4–15.2)

## 2022-10-17 LAB — LACTIC ACID, PLASMA: Lactic Acid, Venous: 5 mmol/L (ref 0.5–1.9)

## 2022-10-17 LAB — I-STAT CHEM 8, ED
BUN: 25 mg/dL — ABNORMAL HIGH (ref 8–23)
Calcium, Ion: 1.36 mmol/L (ref 1.15–1.40)
Chloride: 105 mmol/L (ref 98–111)
Creatinine, Ser: 1.1 mg/dL (ref 0.61–1.24)
Glucose, Bld: 120 mg/dL — ABNORMAL HIGH (ref 70–99)
HCT: 34 % — ABNORMAL LOW (ref 39.0–52.0)
Hemoglobin: 11.6 g/dL — ABNORMAL LOW (ref 13.0–17.0)
Potassium: 3.6 mmol/L (ref 3.5–5.1)
Sodium: 135 mmol/L (ref 135–145)
TCO2: 23 mmol/L (ref 22–32)

## 2022-10-17 LAB — URINALYSIS, ROUTINE W REFLEX MICROSCOPIC
Bacteria, UA: NONE SEEN
Bilirubin Urine: NEGATIVE
Glucose, UA: NEGATIVE mg/dL
Hgb urine dipstick: NEGATIVE
Ketones, ur: NEGATIVE mg/dL
Leukocytes,Ua: NEGATIVE
Nitrite: NEGATIVE
Protein, ur: 30 mg/dL — AB
Specific Gravity, Urine: 1.018 (ref 1.005–1.030)
pH: 5 (ref 5.0–8.0)

## 2022-10-17 LAB — SARS CORONAVIRUS 2 BY RT PCR: SARS Coronavirus 2 by RT PCR: NEGATIVE

## 2022-10-17 MED ORDER — LACTATED RINGERS IV SOLN: INTRAVENOUS | Status: DC

## 2022-10-17 MED ORDER — SODIUM CHLORIDE 0.9 % IV SOLN
1.0000 g | INTRAVENOUS | Status: DC
  Administered 2022-10-17: 1 g via INTRAVENOUS
  Filled 2022-10-17: qty 10

## 2022-10-17 MED ORDER — ACETAMINOPHEN 325 MG PO TABS
650.0000 mg | ORAL_TABLET | Freq: Once | ORAL | Status: AC
  Administered 2022-10-17: 650 mg via ORAL
  Filled 2022-10-17: qty 2

## 2022-10-17 MED ORDER — SODIUM CHLORIDE 0.9 % IV SOLN
500.0000 mg | INTRAVENOUS | Status: DC
  Administered 2022-10-17: 500 mg via INTRAVENOUS
  Filled 2022-10-17 (×2): qty 5

## 2022-10-17 NOTE — ED Provider Notes (Signed)
[place] EMERGENCY DEPARTMENT AT [facility] Provider Note   CSN: [id number] Arrival date & time: 10/17/22  1848     History  Chief Complaint  Patient presents with   Generalized Body Aches         [name] is a 72 y.o. male.  72 year old male presents due to weakness x 1 day.  Patient normally uses a walker due to prior spine and hip issues.  Denies any recent illnesses.  He has no headache.  He has no neck pain.  No recent changes to his medications.  No recent dark or bloody stools.  States that he was standing today became lightheaded and dizzy.  Patient states that he fell today while sitting in his chair.  States earlier his home health nurse came and his blood pressure was 100/60.  Normally has a systolic about 135.  Patient states that his symptoms are back to normal right now and only get worse when he tries to sit up.  History of similar episodes to this associated with hypotension due to to medication effect.  Feels that this is the same.         Home Medications Prior to Admission medications   Medication Sig Start Date End Date Taking? Authorizing Provider  acetaminophen (TYLENOL) 325 MG tablet Take 650 mg by mouth daily as needed (pain).    [provider]  amLODipine (NORVASC) 5 MG tablet Take 1 tablet (5 mg total) by mouth daily. 09/09/22   [name], MD  Bempedoic Acid-Ezetimibe (NEXLIZET) 180-10 MG TABS Take 1 tablet by mouth daily. (Stop taking ezetimibe when you start taking Nexlizet) 02/17/22   Pricilla Riffle, MD  carvedilol (COREG) 12.5 MG tablet Take 1 tablet (12.5 mg total) by mouth 2 (two) times daily with a meal. 03/14/22   Pricilla Riffle, MD  cloNIDine (CATAPRES) 0.2 MG tablet Take 1 tablet (0.2 mg total) by mouth 2 (two) times daily. 08/10/22   [name], MD  doxazosin (CARDURA) 4 MG tablet Take 1 tablet (4 mg total) by mouth daily. 08/10/22   [name], MD  hydrochlorothiazide (MICROZIDE) 12.5 MG capsule Take 1 capsule (12.5  mg total) by mouth daily. 08/10/22   [name], MD  losartan (COZAAR) 100 MG tablet Take 1 tablet (100 mg total) by mouth daily. 08/10/22   [name], MD  lubiprostone (AMITIZA) 24 MCG capsule Take 1 capsule (24 mcg total) by mouth 2 (two) times daily. 08/10/22   [name], MD  OMEGA-3 FATTY ACIDS PO Take 1 capsule by mouth in the morning and at bedtime.    [provider]  warfarin (COUMADIN) 5 MG tablet Take 1.5 tablets (7.5 mg total) by mouth daily. Patient takes 7.5 mg (1.5 tabs) MWF and 5 mg (1 tab) all other days 08/17/22   Hughie Closs, MD      Allergies    Statins and Penicillins    Review of Systems   Review of Systems  All other systems reviewed and are negative.   Physical Exam Updated Vital Signs BP 122/71   Temp 99.1 F (37.3 C) (Oral)  Physical Exam Vitals and nursing note reviewed.  Constitutional:      General: He is not in acute distress.    Appearance: Normal appearance. He is well-developed. He is not toxic-appearing.  HENT:     Head: Normocephalic and atraumatic.  Eyes:     General: Lids are normal.  Conjunctiva/sclera: Conjunctivae normal.     Pupils: Pupils are equal, round, and reactive to light.  Neck:     Thyroid: No thyroid mass.     Trachea: No tracheal deviation.  Cardiovascular:     Rate and Rhythm: Normal rate and regular rhythm.     Heart sounds: Normal heart sounds. No murmur heard.    No gallop.  Pulmonary:     Effort: Pulmonary effort is normal. No respiratory distress.     Breath sounds: Normal breath sounds. No stridor. No decreased breath sounds, wheezing, rhonchi or rales.  Abdominal:     General: There is no distension.     Palpations: Abdomen is soft.     Tenderness: There is no abdominal tenderness. There is no rebound.  Musculoskeletal:        General: No tenderness. Normal range of motion.     Cervical back: Normal range of motion and neck supple.  Skin:    General: Skin is warm and dry.     Findings: No  abrasion or rash.  Neurological:     Mental Status: He is alert and oriented to person, place, and time. Mental status is at baseline.     GCS: GCS eye subscore is 4. GCS verbal subscore is 5. GCS motor subscore is 6.     Cranial Nerves: Cranial nerves are intact. No cranial nerve deficit.     Sensory: No sensory deficit.     Motor: No tremor.     Coordination: Heel to [facility] Test normal.     Comments: Strength 4/5 in upper as well as lower extremities.  Each is normal.  Psychiatric:        Attention and Perception: Attention normal.        Speech: Speech normal.        Behavior: Behavior normal.     ED Results / Procedures / Treatments   Labs (all labs ordered are listed, but only abnormal results are displayed) Labs Reviewed  CBC WITH DIFFERENTIAL/PLATELET  URINALYSIS, ROUTINE W REFLEX MICROSCOPIC  COMPREHENSIVE METABOLIC PANEL  PROTIME-INR  I-STAT CHEM 8, ED    EKG EKG Interpretation Date/Time:  Monday October 17 2022 18:53:05 EDT Ventricular Rate:  94 PR Interval:  132 QRS Duration:  135 QT Interval:  385 QTC Calculation: 482 R Axis:   243  Text Interpretation: Unknown rhythm, irregular rate Nonspecific IVCD with LAD Confirmed by Lorre Nick ([id number]) on 10/17/2022 7:00:47 PM  Radiology No results found.  Procedures Procedures    Medications Ordered in ED Medications  lactated ringers infusion (has no administration in time range)    ED Course/ Medical Decision Making/ A&P                                 Medical Decision Making Amount and/or Complexity of Data Reviewed Labs: ordered. Radiology: ordered.  Risk OTC drugs. Prescription drug management.   Patient's chest x-ray shows possible infiltrate.  Does have low-grade temperature here of 100.6. Given Tylenol.  Significant leukocytosis with white count of 22,000.  Analysis negative for infection at this time.  COVID test is negative.  Patient started on Zithromax and Rocephin.  Patient will require  admission to the hospital.  Will consult hospitalist team        Final Clinical Impression(s) / ED Diagnoses Final diagnoses:  None    Rx / DC Orders ED Discharge Orders     None  Lorre Nick, MD 10/17/22 2112

## 2022-10-17 NOTE — H&P (Incomplete)
PCP:   [name], MD   Chief Complaint:  Weakness, fall.  HPI: Is a 72 year old male with past medical history of HTN, moderate aortic stenosis, mitral valve prolapse, atrial fibrillation, hyperlipidemia, OSA not on CPAP, HTN, morbid obesity.  Earlier this week he developed what he calls a viral type illness.  He had shakes and chills which resolved.  He denies shortness of breath, cough, nausea, vomiting or fevers.  Since then he has become weaker and weaker.  Today he went to the restroom several times, on his fourth trip he felt he could hardly make it back to his living room.  He was very weak.  He could hardly control his walker which was rolling out from under him.  He eventually fell.  He states he did not hit his head and there was no loss of consciousness.  911 was called.  In the ER patient's Tmax 100.6.  Other vitals stable.  WBC 22.5, INR 3.8.  Chest x-ray cardiomegaly with pulmonary vascular congestion, possibly edema versus infiltrate.  Lactic acid 5.0.  Patient treating well on room air.  Blood cultures x 2 collected.  Patient given IV Rocephin and azithromycin.  Review of Systems:  Per HPI  Past Medical History: Past Medical History:  Diagnosis Date   Adenomatous colon polyp 07/2003   Heart valve problem    Dr [name]   Hypertension    Mitral valve disorders(424.0)    Nonrheumatic aortic valve stenosis 01/14/2021   Echo 9/22: EF 60-65, small pericardial effusion, mild AS (mean gradient 16 mmHg), trivial AI, mild MR (MR is eccentric - may be underestimated) // Echo 10/2021: EF 60-65, mod calcification of AV, trivial AI, mod-severe AS (DI 0.25, V-max 350 cm/s, mean 28.8), no RWMA, NL RVSF, severe LAE, mild RAE, small effusion w/o tamponade, ?mild-moderate MR (difficult due to eccentric jet)   Nonrheumatic mitral valve regurgitation 01/14/2021   Echo 10/2021: EF 60-65, moderate calcification of the aortic valve, trivial AI, moderate to severe aortic stenosis (DI 0.25,  V-max 350 cm/s, mean gradient 28.8), no RWMA, normal RVSF, severe LAE, mild RAE, small pericardial effusion without tamponade, suspect mild-moderate MR (difficult to qualify degree due to eccentric jet with coanda affect)   Obesity    OSA (obstructive sleep apnea)    cpap   Other and unspecified hyperlipidemia    Permanent atrial fibrillation (HCC)    Zio Monitor 5/22: AFib - Avg HR 55; slowest 32; longest pause 3.9 sec; occ PVCs, 4 beat NSVT   Sleep apnea    Past Surgical History:  Procedure Laterality Date   COLONOSCOPY  06/09/2015   per Dr. Russella Dar, adenomatous polyp, repeat in 7 yrs   FOOT SURGERY  at age 45   LUMBAR LAMINECTOMY/DECOMPRESSION MICRODISCECTOMY N/A 11/21/2012   Procedure: Lumbar Three to Lumbar Five Laminectomy;  Surgeon: Karn Cassis, MD;  Location: [facility];  Service: Neurosurgery;  Laterality: N/A;  Lumbar Three to Lumbar Five Laminectomy   RIGHT/LEFT HEART CATH AND CORONARY ANGIOGRAPHY N/A 12/17/2021   Procedure: RIGHT/LEFT HEART CATH AND CORONARY ANGIOGRAPHY;  Surgeon: Marykay Lex, MD;  Location: [facility] INVASIVE CV LAB;  Service: Cardiovascular;  Laterality: N/A;    Medications: Prior to Admission medications   Medication Sig Start Date End Date Taking? Authorizing Provider  acetaminophen (TYLENOL) 325 MG tablet Take 650 mg by mouth daily as needed (pain).    [provider]  amLODipine (NORVASC) 5 MG tablet Take 1 tablet (5 mg total) by mouth daily.  09/09/22   [name], MD  Bempedoic Acid-Ezetimibe (NEXLIZET) 180-10 MG TABS Take 1 tablet by mouth daily. (Stop taking ezetimibe when you start taking Nexlizet) 02/17/22   Pricilla Riffle, MD  carvedilol (COREG) 12.5 MG tablet Take 1 tablet (12.5 mg total) by mouth 2 (two) times daily with a meal. 03/14/22   Pricilla Riffle, MD  cloNIDine (CATAPRES) 0.2 MG tablet Take 1 tablet (0.2 mg total) by mouth 2 (two) times daily. 08/10/22   [name], MD  doxazosin (CARDURA) 4 MG tablet Take 1 tablet (4 mg  total) by mouth daily. 08/10/22   [name], MD  hydrochlorothiazide (MICROZIDE) 12.5 MG capsule Take 1 capsule (12.5 mg total) by mouth daily. 08/10/22   [name], MD  losartan (COZAAR) 100 MG tablet Take 1 tablet (100 mg total) by mouth daily. 08/10/22   [name], MD  lubiprostone (AMITIZA) 24 MCG capsule Take 1 capsule (24 mcg total) by mouth 2 (two) times daily. 08/10/22   [name], MD  OMEGA-3 FATTY ACIDS PO Take 1 capsule by mouth in the morning and at bedtime.    [provider]  warfarin (COUMADIN) 5 MG tablet Take 1.5 tablets (7.5 mg total) by mouth daily. Patient takes 7.5 mg (1.5 tabs) MWF and 5 mg (1 tab) all other days 08/17/22   Hughie Closs, MD    Allergies:   Allergies  Allergen Reactions   Statins Other (See Comments)    "Muscles were messed up"    Penicillins Other (See Comments)    Childhood reaction. Unknown per Pt, cannot remember ever trying beta lactams     Social History:  reports that he has never smoked. He has never used smokeless tobacco. He reports that he does not drink alcohol and does not use drugs.  Family History: Family History  Problem Relation Age of Onset   Alzheimer's disease Mother    Stroke Father    Heart disease Father    Hypertension Father    Alzheimer's disease Maternal Aunt        x 3   Colon cancer Neg Hx    Hypercalcemia Neg Hx     Physical Exam: Vitals:   10/17/22 1854 10/17/22 1901 10/17/22 1920  BP: 122/71  139/81  Pulse:   92  Resp:   18  Temp: 99.1 F (37.3 C) (!) 100.6 F (38.1 C)   TempSrc: Oral Rectal     General: Alert and oriented x 3, morbid obesity, no acute distress Eyes: Pink conjunctiva, no scleral icterus ENT: Moist oral mucosa, poor dentition Lungs: CTA BL, no wheeze, no crackles, no use of accessory muscles Cardiovascular: RRR, no regurgitation, no gallops, no murmurs. No carotid bruits, no JVD Abdomen: soft, positive BS, NTND, no organomegaly, not an acute abdomen GU: not  examined Neuro: CN II - XII grossly intact, sensation intact Musculoskeletal: strength 5/5 all extremities, no clubbing, cyanosis or edema Skin: no rash, no subcutaneous crepitation, no decubitus Psych: appropriate patient  Labs on Admission:  Recent Labs    10/17/22 1929 10/17/22 1946  NA 133* 135  K 3.4* 3.6  CL 100 105  CO2 24  --   GLUCOSE 124* 120*  BUN 24* 25*  CREATININE 1.09 1.10  CALCIUM 10.7*  --    Recent Labs    10/17/22 1929  AST 60*  ALT 24  ALKPHOS 61  BILITOT 0.7  PROT 6.5  ALBUMIN 2.9*    Recent Labs  10/17/22 1929 10/17/22 1946  WBC 22.5*  --   NEUTROABS 21.0*  --   HGB 11.9* 11.6*  HCT 35.7* 34.0*  MCV 92.7  --   PLT 257  --      Micro Results: Recent Results (from the past 240 hour(s))  SARS Coronavirus 2 by RT PCR (hospital order, performed in [facility] hospital lab) *cepheid single result test* Anterior Nasal Swab     Status: None   Collection Time: 10/17/22  7:29 PM   Specimen: Anterior Nasal Swab  Result Value Ref Range Status   SARS Coronavirus 2 by RT PCR NEGATIVE NEGATIVE Final    Comment: Performed at [facility] Lab, [address] [place], [place] [address]     Radiological Exams on Admission: DG Chest Port 1 View  Result Date: 10/17/2022 CLINICAL DATA:  Cough. EXAM: PORTABLE CHEST 1 VIEW COMPARISON:  08/11/2022. FINDINGS: The heart is enlarged and the mediastinal contour is stable. There is atherosclerotic calcification of the aorta. The pulmonary vasculature is mildly distended. Interstitial prominence is present bilaterally. Subsegmental atelectasis or scarring is noted bilaterally. No effusion or pneumothorax. No acute osseous abnormality. IMPRESSION: 1. Cardiomegaly with pulmonary vascular congestion. 2. Interstitial prominence bilaterally, possible edema or infiltrate. Electronically Signed   By: Thornell Sartorius M.D.   On: 10/17/2022 20:09    Assessment/Plan Present on Admission:  Acquired pneumonia // lactic  acidosis -Pneumonia order set initiated -BNP and Calcitonin levels ordered -IV Rocephin and azithromycin continue -2.5 L IV fluids ordered.  NS at 150cc/hr -Follow lactic acid level until normalized  Mod AS  /  MV -BNP 465.  No clinical evidence of fluid overload. -Last 2D echo 12/15/2021  CAD // HTN -Coreg, losartan resumed. -As needed blood pressure medications -Patient's Coreg, losartan, and clonidine resumed -Norvasc still on hold, monitor blood pressure  Atrial fibrillation -Coreg resumed -Coumadin ordered, pharmacy to dose  Constipation -MiraLAX as needed.  Patient declines medication for constipation   Dyslipidemia -Nexlizet, non-formulary, not ordered   Obstructive sleep apnea -does not use CPap    Sherma Vanmetre 10/17/2022, 9:43 PM

## 2022-10-17 NOTE — ED Notes (Signed)
ED TO INPATIENT HANDOFF REPORT  ED Nurse Name and Phone #: Milagros Loll [phone]  S Name/Age/Gender [name] 72 y.o. male Room/Bed: 006C/006C  Code Status   Code Status: Prior  Home/SNF/Other Home Patient oriented to: situation Is this baseline? Yes   Triage Complete: Triage complete  Chief Complaint Pneumonia [J18.9]  Triage Note Patient arrives by EMS with c/o generalized weakness, more both arms.   Patient reports at baseline uses walker and today was having difficulty holding himself up with his arm.s   Patient reports fell today while sitting in chair.   Denies Injury.   Patient is on coumadin for hx of afib.  A&O x 4    Allergies Allergies  Allergen Reactions   Statins Other (See Comments)    "Muscles were messed up"    Penicillins Other (See Comments)    Childhood reaction. Unknown per Pt, cannot remember ever trying beta lactams     Level of Care/Admitting Diagnosis ED Disposition     ED Disposition  Admit   Condition  --   Comment  Hospital [facility]: [facility] [[id number]]  Level of Care: Telemetry Medical [104]  May admit patient to [facility] or [facility] if equivalent level of care is available:: No  Covid Evaluation: Confirmed COVID Negative  Diagnosis: Pneumonia [227785]  Admitting Physician: Gery Pray [[id number]]  Attending Physician: Gery Pray [[id number]]  Certification:: I certify this patient will need inpatient services for at least 2 midnights  Estimated Length of Stay: 2          B Medical/Surgery History Past Medical History:  Diagnosis Date   Adenomatous colon polyp 07/2003   Heart valve problem    Dr [name]   Hypertension    Mitral valve disorders(424.0)    Nonrheumatic aortic valve stenosis 01/14/2021   Echo 9/22: EF 60-65, small pericardial effusion, mild AS (mean gradient 16 mmHg), trivial AI, mild MR (MR is eccentric - may be underestimated) // Echo 10/2021: EF 60-65, mod calcification of AV, trivial AI,  mod-severe AS (DI 0.25, V-max 350 cm/s, mean 28.8), no RWMA, NL RVSF, severe LAE, mild RAE, small effusion w/o tamponade, ?mild-moderate MR (difficult due to eccentric jet)   Nonrheumatic mitral valve regurgitation 01/14/2021   Echo 10/2021: EF 60-65, moderate calcification of the aortic valve, trivial AI, moderate to severe aortic stenosis (DI 0.25, V-max 350 cm/s, mean gradient 28.8), no RWMA, normal RVSF, severe LAE, mild RAE, small pericardial effusion without tamponade, suspect mild-moderate MR (difficult to qualify degree due to eccentric jet with coanda affect)   Obesity    OSA (obstructive sleep apnea)    cpap   Other and unspecified hyperlipidemia    Permanent atrial fibrillation (HCC)    Zio Monitor 5/22: AFib - Avg HR 55; slowest 32; longest pause 3.9 sec; occ PVCs, 4 beat NSVT   Sleep apnea    Past Surgical History:  Procedure Laterality Date   COLONOSCOPY  06/09/2015   per Dr. Russella Dar, adenomatous polyp, repeat in 7 yrs   FOOT SURGERY  at age 70   LUMBAR LAMINECTOMY/DECOMPRESSION MICRODISCECTOMY N/A 11/21/2012   Procedure: Lumbar Three to Lumbar Five Laminectomy;  Surgeon: Karn Cassis, MD;  Location: [facility];  Service: Neurosurgery;  Laterality: N/A;  Lumbar Three to Lumbar Five Laminectomy   RIGHT/LEFT HEART CATH AND CORONARY ANGIOGRAPHY N/A 12/17/2021   Procedure: RIGHT/LEFT HEART CATH AND CORONARY ANGIOGRAPHY;  Surgeon: Marykay Lex, MD;  Location: [facility] INVASIVE CV LAB;  Service: Cardiovascular;  Laterality: N/A;     A IV Location/Drains/Wounds Patient Lines/Drains/Airways Status     Active Line/Drains/Airways     Name Placement date Placement time Site Days   Peripheral IV 10/17/22 18 G Left Forearm 10/17/22  --  Forearm  less than 1   External Urinary Catheter 08/11/22  1941  --  67   Wound / Incision (Open or Dehisced) 12/15/21 Irritant Dermatitis (Moisture Associated Skin Damage);(ITD) Intertriginous Dermatitis Groin Anterior;Posterior;Mid;Right;Left  12/15/21  1800  Groin  306   Wound / Incision (Open or Dehisced) 12/15/21 Other (Comment);Irritant Dermatitis (Moisture Associated Skin Damage) Pretibial Right 12/15/21  1900  Pretibial  306            Intake/Output Last 24 hours  Intake/Output Summary (Last 24 hours) at 10/17/2022 2302 Last data filed at 10/17/2022 2258 Gross per 24 hour  Intake 350 ml  Output --  Net 350 ml    Labs/Imaging Results for orders placed or performed during the hospital encounter of 10/17/22 (from the past 48 hour(s))  CBC with Differential/Platelet     Status: Abnormal   Collection Time: 10/17/22  7:29 PM  Result Value Ref Range   WBC 22.5 (H) 4.0 - 10.5 K/uL   RBC 3.85 (L) 4.22 - 5.81 MIL/uL   Hemoglobin 11.9 (L) 13.0 - 17.0 g/dL   HCT 16.1 (L) 09.6 - 04.5 %   MCV 92.7 80.0 - 100.0 fL   MCH 30.9 26.0 - 34.0 pg   MCHC 33.3 30.0 - 36.0 g/dL   RDW 40.9 81.1 - 91.4 %   Platelets 257 150 - 400 K/uL   nRBC 0.0 0.0 - 0.2 %   Neutrophils Relative % 93 %   Neutro Abs 21.0 (H) 1.7 - 7.7 K/uL   Lymphocytes Relative 3 %   Lymphs Abs 0.7 0.7 - 4.0 K/uL   Monocytes Relative 3 %   Monocytes Absolute 0.6 0.1 - 1.0 K/uL   Eosinophils Relative 0 %   Eosinophils Absolute 0.0 0.0 - 0.5 K/uL   Basophils Relative 0 %   Basophils Absolute 0.0 0.0 - 0.1 K/uL   Immature Granulocytes 1 %   Abs Immature Granulocytes 0.15 (H) 0.00 - 0.07 K/uL    Comment: Performed at [facility], [address], [place], [place] [address]  Urinalysis, Routine w reflex microscopic -Urine, Clean Catch     Status: Abnormal   Collection Time: 10/17/22  7:29 PM  Result Value Ref Range   Color, Urine YELLOW YELLOW   APPearance CLEAR CLEAR   Specific Gravity, Urine 1.018 1.005 - 1.030   pH 5.0 5.0 - 8.0   Glucose, UA NEGATIVE NEGATIVE mg/dL   Hgb urine dipstick NEGATIVE NEGATIVE   Bilirubin Urine NEGATIVE NEGATIVE   Ketones, ur NEGATIVE NEGATIVE mg/dL   Protein, ur 30 (A) NEGATIVE mg/dL   Nitrite NEGATIVE NEGATIVE    Leukocytes,Ua NEGATIVE NEGATIVE   RBC / HPF 0-5 0 - 5 RBC/hpf   WBC, UA 0-5 0 - 5 WBC/hpf   Bacteria, UA NONE SEEN NONE SEEN   Squamous Epithelial / HPF 0-5 0 - 5 /HPF   Mucus PRESENT     Comment: Performed at [facility], [address], [place], [place] [address]  Comprehensive metabolic panel     Status: Abnormal   Collection Time: 10/17/22  7:29 PM  Result Value Ref Range   Sodium 133 (L) 135 - 145 mmol/L   Potassium 3.4 (L) 3.5 - 5.1 mmol/L   Chloride  100 98 - 111 mmol/L   CO2 24 22 - 32 mmol/L   Glucose, Bld 124 (H) 70 - 99 mg/dL    Comment: Glucose reference range applies only to samples taken after fasting for at least 8 hours.   BUN 24 (H) 8 - 23 mg/dL   Creatinine, Ser 4.09 0.61 - 1.24 mg/dL   Calcium 81.1 (H) 8.9 - 10.3 mg/dL   Total Protein 6.5 6.5 - 8.1 g/dL   Albumin 2.9 (L) 3.5 - 5.0 g/dL   AST 60 (H) 15 - 41 U/L   ALT 24 0 - 44 U/L   Alkaline Phosphatase 61 38 - 126 U/L   Total Bilirubin 0.7 0.3 - 1.2 mg/dL   GFR, Estimated >91 >47 mL/min    Comment: (NOTE) Calculated using the CKD-EPI Creatinine Equation (2021)    Anion gap 9 5 - 15    Comment: Performed at [facility], [address] [place], [place] [address]  Protime-INR     Status: Abnormal   Collection Time: 10/17/22  7:29 PM  Result Value Ref Range   Prothrombin Time 38.1 (H) 11.4 - 15.2 seconds   INR 3.8 (H) 0.8 - 1.2    Comment: (NOTE) INR goal varies based on device and disease states. Performed at [facility], [address], [place], [place] [address]   SARS Coronavirus 2 by RT PCR (hospital order, performed in [facility] hospital lab) *cepheid single result test* Anterior Nasal Swab     Status: None   Collection Time: 10/17/22  7:29 PM   Specimen: Anterior Nasal Swab  Result Value Ref Range   SARS Coronavirus 2 by RT PCR NEGATIVE NEGATIVE    Comment: Performed at [facility] Lab, [address] [place], [place] [address]  I-stat chem 8, ed     Status: Abnormal    Collection Time: 10/17/22  7:46 PM  Result Value Ref Range   Sodium 135 135 - 145 mmol/L   Potassium 3.6 3.5 - 5.1 mmol/L   Chloride 105 98 - 111 mmol/L   BUN 25 (H) 8 - 23 mg/dL   Creatinine, Ser 6.96 0.61 - 1.24 mg/dL   Glucose, Bld 295 (H) 70 - 99 mg/dL    Comment: Glucose reference range applies only to samples taken after fasting for at least 8 hours.   Calcium, Ion 1.36 1.15 - 1.40 mmol/L   TCO2 23 22 - 32 mmol/L   Hemoglobin 11.6 (L) 13.0 - 17.0 g/dL   HCT 28.4 (L) 13.2 - 44.0 %  Lactic acid, plasma     Status: Abnormal   Collection Time: 10/17/22  8:59 PM  Result Value Ref Range   Lactic Acid, Venous 5.0 (HH) 0.5 - 1.9 mmol/L    Comment: CRITICAL RESULT CALLED TO, READ BACK BY AND VERIFIED WITH Valaria Good, RN. 2209 10/17/22. LPAIT Performed at [facility] Lab, [address], [place], [place] [address]    DG Chest Port 1 View  Result Date: 10/17/2022 CLINICAL DATA:  Cough. EXAM: PORTABLE CHEST 1 VIEW COMPARISON:  08/11/2022. FINDINGS: The heart is enlarged and the mediastinal contour is stable. There is atherosclerotic calcification of the aorta. The pulmonary vasculature is mildly distended. Interstitial prominence is present bilaterally. Subsegmental atelectasis or scarring is noted bilaterally. No effusion or pneumothorax. No acute osseous abnormality. IMPRESSION: 1. Cardiomegaly with pulmonary vascular congestion. 2. Interstitial prominence bilaterally, possible edema or infiltrate. Electronically Signed   By: Charlestine Night.D.  On: 10/17/2022 20:09    Pending Labs Unresulted Labs (From admission, onward)     Start     Ordered   10/17/22 2138  Lactic acid, plasma  (Lactic Acid)  STAT Now then every 3 hours,   R      10/17/22 2137   10/17/22 2138  Procalcitonin  Once,   URGENT       References:    Procalcitonin Lower Respiratory Tract Infection AND Sepsis Procalcitonin Algorithm   10/17/22 2137   10/17/22 2138  Brain natriuretic peptide  Once,   URGENT        10/17/22  2137   10/17/22 2015  Culture, blood (Routine X 2) w Reflex to ID Panel  BLOOD CULTURE X 2,   R     Question:  Patient immune status  Answer:  Normal   10/17/22 2014            Vitals/Pain Today's Vitals   10/17/22 1854 10/17/22 1901 10/17/22 1920  BP: 122/71  139/81  Pulse:   92  Resp:   18  Temp: 99.1 F (37.3 C) (!) 100.6 F (38.1 C)   TempSrc: Oral Rectal   PainSc: 0-No pain      Isolation Precautions Airborne and Contact precautions  Medications Medications  lactated ringers infusion ( Intravenous New Bag/Given 10/17/22 1931)  azithromycin (ZITHROMAX) 500 mg in sodium chloride 0.9 % 250 mL IVPB (0 mg Intravenous Stopped 10/17/22 2258)  cefTRIAXone (ROCEPHIN) 1 g in sodium chloride 0.9 % 100 mL IVPB (0 g Intravenous Stopped 10/17/22 2131)  acetaminophen (TYLENOL) tablet 650 mg (650 mg Oral Given 10/17/22 2103)    Mobility non-ambulatory     Focused Assessments Pulmonary Assessment Handoff:  Lung sounds:     O2 Flow Rate (L/min): 100 L/min    R Recommendations: See Admitting Provider Note  Report given to:   Additional Notes: N/A

## 2022-10-17 NOTE — ED Triage Notes (Signed)
Patient arrives by EMS with c/o generalized weakness, more both arms.   Patient reports at baseline uses walker and today was having difficulty holding himself up with his arm.s   Patient reports fell today while sitting in chair.   Denies Injury.   Patient is on coumadin for hx of afib.  A&O x 4

## 2022-10-18 ENCOUNTER — Inpatient Hospital Stay (HOSPITAL_COMMUNITY): Payer: PPO

## 2022-10-18 DIAGNOSIS — J189 Pneumonia, unspecified organism: Secondary | ICD-10-CM | POA: Diagnosis not present

## 2022-10-18 LAB — BLOOD CULTURE ID PANEL (REFLEXED) - BCID2

## 2022-10-18 LAB — CBC WITH DIFFERENTIAL/PLATELET
Abs Immature Granulocytes: 0.26 10*3/uL — ABNORMAL HIGH (ref 0.00–0.07)
Basophils Absolute: 0 10*3/uL (ref 0.0–0.1)
Basophils Relative: 0 %
Eosinophils Absolute: 0 10*3/uL (ref 0.0–0.5)
Eosinophils Relative: 0 %
HCT: 30.8 % — ABNORMAL LOW (ref 39.0–52.0)
Hemoglobin: 10.3 g/dL — ABNORMAL LOW (ref 13.0–17.0)
Immature Granulocytes: 1 %
Lymphocytes Relative: 4 %
Lymphs Abs: 0.7 10*3/uL (ref 0.7–4.0)
MCH: 30.1 pg (ref 26.0–34.0)
MCHC: 33.4 g/dL (ref 30.0–36.0)
MCV: 90.1 fL (ref 80.0–100.0)
Monocytes Absolute: 0.6 10*3/uL (ref 0.1–1.0)
Monocytes Relative: 3 %
Neutro Abs: 19 10*3/uL — ABNORMAL HIGH (ref 1.7–7.7)
Neutrophils Relative %: 92 %
Platelets: 212 10*3/uL (ref 150–400)
RBC: 3.42 MIL/uL — ABNORMAL LOW (ref 4.22–5.81)
RDW: 14.7 % (ref 11.5–15.5)
WBC: 20.6 10*3/uL — ABNORMAL HIGH (ref 4.0–10.5)
nRBC: 0 % (ref 0.0–0.2)

## 2022-10-18 LAB — BASIC METABOLIC PANEL
Anion gap: 10 (ref 5–15)
BUN: 20 mg/dL (ref 8–23)
CO2: 21 mmol/L — ABNORMAL LOW (ref 22–32)
Calcium: 9.6 mg/dL (ref 8.9–10.3)
Chloride: 101 mmol/L (ref 98–111)
Creatinine, Ser: 0.92 mg/dL (ref 0.61–1.24)
GFR, Estimated: >60 mL/min (ref >60)
Glucose, Bld: 103 mg/dL — ABNORMAL HIGH (ref 70–99)
Potassium: 3.2 mmol/L — ABNORMAL LOW (ref 3.5–5.1)
Sodium: 132 mmol/L — ABNORMAL LOW (ref 135–145)

## 2022-10-18 LAB — PROTIME-INR
INR: 3.3 — ABNORMAL HIGH (ref 0.8–1.2)
Prothrombin Time: 33.8 seconds — ABNORMAL HIGH (ref 11.4–15.2)

## 2022-10-18 LAB — LACTIC ACID, PLASMA
Lactic Acid, Venous: 1.3 mmol/L (ref 0.5–1.9)
Lactic Acid, Venous: 1 mmol/L (ref 0.5–1.9)

## 2022-10-18 LAB — PROCALCITONIN: Procalcitonin: 2.35 ng/mL

## 2022-10-18 LAB — STREP PNEUMONIAE URINARY ANTIGEN: Strep Pneumo Urinary Antigen: NEGATIVE

## 2022-10-18 MED ORDER — CEFAZOLIN SODIUM-DEXTROSE 2-4 GM/100ML-% IV SOLN
2.0000 g | Freq: Three times a day (TID) | INTRAVENOUS | Status: DC
  Administered 2022-10-19 – 2022-10-26 (×21): 2 g via INTRAVENOUS
  Filled 2022-10-18 (×21): qty 100

## 2022-10-18 MED ORDER — POTASSIUM CHLORIDE CRYS ER 20 MEQ PO TBCR
40.0000 meq | EXTENDED_RELEASE_TABLET | Freq: Once | ORAL | Status: AC
  Administered 2022-10-18: 40 meq via ORAL
  Filled 2022-10-18: qty 2

## 2022-10-18 MED ORDER — WARFARIN SODIUM 5 MG PO TABS
5.0000 mg | ORAL_TABLET | Freq: Once | ORAL | Status: AC
  Administered 2022-10-18: 5 mg via ORAL
  Filled 2022-10-18: qty 1

## 2022-10-18 MED ORDER — ACETAMINOPHEN 650 MG RE SUPP: 650.0000 mg | Freq: Four times a day (QID) | RECTAL | Status: DC | PRN

## 2022-10-18 MED ORDER — FUROSEMIDE 10 MG/ML IJ SOLN
40.0000 mg | Freq: Once | INTRAMUSCULAR | Status: AC
  Administered 2022-10-18: 40 mg via INTRAVENOUS
  Filled 2022-10-18: qty 4

## 2022-10-18 MED ORDER — OMEGA-3-ACID ETHYL ESTERS 1 G PO CAPS
1.0000 g | ORAL_CAPSULE | Freq: Every day | ORAL | Status: DC
  Administered 2022-10-18 – 2022-10-30 (×13): 1 g via ORAL
  Filled 2022-10-18 (×13): qty 1

## 2022-10-18 MED ORDER — LOSARTAN POTASSIUM 50 MG PO TABS
100.0000 mg | ORAL_TABLET | Freq: Every day | ORAL | Status: DC
  Administered 2022-10-18: 100 mg via ORAL
  Filled 2022-10-18: qty 2

## 2022-10-18 MED ORDER — SODIUM CHLORIDE 0.9 % IV SOLN
2.0000 g | INTRAVENOUS | Status: DC
  Administered 2022-10-18: 2 g via INTRAVENOUS
  Filled 2022-10-18: qty 20

## 2022-10-18 MED ORDER — SODIUM CHLORIDE 0.9 % IV BOLUS: 1000.0000 mL | Freq: Once | INTRAVENOUS | Status: DC

## 2022-10-18 MED ORDER — DOXAZOSIN MESYLATE 4 MG PO TABS
4.0000 mg | ORAL_TABLET | Freq: Every day | ORAL | Status: DC
  Administered 2022-10-18 – 2022-10-30 (×13): 4 mg via ORAL
  Filled 2022-10-18 (×13): qty 1

## 2022-10-18 MED ORDER — SENNOSIDES-DOCUSATE SODIUM 8.6-50 MG PO TABS
1.0000 | ORAL_TABLET | Freq: Every evening | ORAL | Status: DC | PRN
  Administered 2022-10-18 – 2022-10-27 (×4): 1 via ORAL
  Filled 2022-10-18 (×4): qty 1

## 2022-10-18 MED ORDER — CLONIDINE HCL 0.1 MG PO TABS
0.2000 mg | ORAL_TABLET | Freq: Two times a day (BID) | ORAL | Status: DC
  Administered 2022-10-18 – 2022-10-30 (×25): 0.2 mg via ORAL
  Filled 2022-10-18 (×25): qty 2

## 2022-10-18 MED ORDER — SODIUM CHLORIDE 0.9 % IV BOLUS
500.0000 mL | Freq: Once | INTRAVENOUS | Status: AC
  Administered 2022-10-18: 500 mL via INTRAVENOUS

## 2022-10-18 MED ORDER — SODIUM CHLORIDE 0.9 % IV BOLUS
2000.0000 mL | Freq: Once | INTRAVENOUS | Status: AC
  Administered 2022-10-18: 2000 mL via INTRAVENOUS

## 2022-10-18 MED ORDER — CARVEDILOL 12.5 MG PO TABS
12.5000 mg | ORAL_TABLET | Freq: Two times a day (BID) | ORAL | Status: DC
  Administered 2022-10-18 – 2022-10-30 (×24): 12.5 mg via ORAL
  Filled 2022-10-18 (×25): qty 1

## 2022-10-18 MED ORDER — POTASSIUM CHLORIDE IN NACL 20-0.9 MEQ/L-% IV SOLN
INTRAVENOUS | Status: DC
  Filled 2022-10-18: qty 1000

## 2022-10-18 MED ORDER — OXYMETAZOLINE HCL 0.05 % NA SOLN
1.0000 | Freq: Two times a day (BID) | NASAL | Status: AC | PRN
  Administered 2022-10-18: 1 via NASAL
  Filled 2022-10-18: qty 30

## 2022-10-18 MED ORDER — ACETAMINOPHEN 325 MG PO TABS: 650.0000 mg | ORAL_TABLET | Freq: Four times a day (QID) | ORAL | Status: DC | PRN

## 2022-10-18 MED ORDER — BEMPEDOIC ACID-EZETIMIBE 180-10 MG PO TABS: 1.0000 | ORAL_TABLET | Freq: Every day | ORAL | Status: DC

## 2022-10-18 MED ORDER — LUBIPROSTONE 24 MCG PO CAPS
24.0000 ug | ORAL_CAPSULE | Freq: Two times a day (BID) | ORAL | Status: DC
  Administered 2022-10-18 – 2022-10-30 (×25): 24 ug via ORAL
  Filled 2022-10-18 (×26): qty 1

## 2022-10-18 MED ORDER — WARFARIN - PHARMACIST DOSING INPATIENT: Freq: Every day | Status: DC

## 2022-10-18 MED ORDER — ALBUTEROL SULFATE (2.5 MG/3ML) 0.083% IN NEBU
2.5000 mg | INHALATION_SOLUTION | RESPIRATORY_TRACT | Status: DC | PRN
  Administered 2022-10-18: 2.5 mg via RESPIRATORY_TRACT
  Filled 2022-10-18: qty 3

## 2022-10-18 NOTE — Progress Notes (Signed)
ANTICOAGULATION CONSULT NOTE Pharmacy Consult for Warfarin  Indication: atrial fibrillation  Allergies  Allergen Reactions   Statins Other (See Comments)    "Muscles were messed up"    Penicillins Other (See Comments)    Childhood reaction. Unknown per Pt, cannot remember ever trying beta lactams     Vital Signs: Temp: 98.7 F (37.1 C) (08/13 0739) Temp Source: Oral (08/13 0448) BP: 172/98 (08/13 0739) Pulse Rate: 93 (08/13 0739)  Labs: Recent Labs    10/17/22 1929 10/17/22 1946 10/18/22 0242 10/18/22 0617  HGB 11.9* 11.6* 10.3*  --   HCT 35.7* 34.0* 30.8*  --   PLT 257  --  212  --   LABPROT 38.1*  --   --  33.8*  INR 3.8*  --   --  3.3*  CREATININE 1.09 1.10 0.92  --     Estimated Creatinine Clearance: 90.1 mL/min (by C-G formula based on SCr of 0.92 mg/dL).   Medical History: Past Medical History:  Diagnosis Date   Adenomatous colon polyp 07/2003   Heart valve problem    Dr [name]   Hypertension    Mitral valve disorders(424.0)    Nonrheumatic aortic valve stenosis 01/14/2021   Echo 9/22: EF 60-65, small pericardial effusion, mild AS (mean gradient 16 mmHg), trivial AI, mild MR (MR is eccentric - may be underestimated) // Echo 10/2021: EF 60-65, mod calcification of AV, trivial AI, mod-severe AS (DI 0.25, V-max 350 cm/s, mean 28.8), no RWMA, NL RVSF, severe LAE, mild RAE, small effusion w/o tamponade, ?mild-moderate MR (difficult due to eccentric jet)   Nonrheumatic mitral valve regurgitation 01/14/2021   Echo 10/2021: EF 60-65, moderate calcification of the aortic valve, trivial AI, moderate to severe aortic stenosis (DI 0.25, V-max 350 cm/s, mean gradient 28.8), no RWMA, normal RVSF, severe LAE, mild RAE, small pericardial effusion without tamponade, suspect mild-moderate MR (difficult to qualify degree due to eccentric jet with coanda affect)   Obesity    OSA (obstructive sleep apnea)    cpap   Other and unspecified hyperlipidemia    Permanent atrial  fibrillation (HCC)    Zio Monitor 5/22: AFib - Avg HR 55; slowest 32; longest pause 3.9 sec; occ PVCs, 4 beat NSVT   Sleep apnea     Assessment: 72 y/o M present to the ED with generalized weakness. On warfarin PTA for afib. INR is supra-therapeutic at 3.8 on admission 8/12 PM, trended down close to therapeutic at 3.3 this morning. Hg down a bit to 10.3, plt WNL. No bleed issues reported. Noted DDI with azithromycin - may cause INR to increase.  PTA dose - 5mg  daily except 7.5mg  qMWF per last outpatient anticoagulation clinic note 7/31 and med rec, INR 2.2 (last dose pta 8/11)  Goal of Therapy:  INR 2-3 Monitor platelets by anticoagulation protocol: Yes   Plan:  Resume home warfarin 5mg  PO x 1 this evening with INR trending down close to therapeutic. May need to hold 8/14 if trends back up further Monitor daily INR, CBC, s/sx bleeding, DDI   Leia Alf, PharmD, BCPS Please check AMION for all [facility] contact numbers Clinical Pharmacist 10/18/2022 10:56 AM

## 2022-10-18 NOTE — Progress Notes (Signed)
PHARMACY - PHYSICIAN COMMUNICATION CRITICAL VALUE ALERT - BLOOD CULTURE IDENTIFICATION (BCID)  [name] is an 72 y.o. male who presented to [facility] on 10/17/2022 with a chief complaint of generalized weakness in both arms.   Assessment:  Blood culture positive in 1/2 with Group B streptococcus , no resistance detected.  Name of physician (or Provider) Contacted: Lanae Boast, MD  Current antibiotics: ceftriaxone, azithromycin  Changes to prescribed antibiotics recommended:  Group B strep. covered with current regimen. Recommend narrowing to cefazolin IV 2g q8h.  Results for orders placed or performed during the hospital encounter of 10/17/22  Blood Culture ID Panel (Reflexed) (Collected: 10/17/2022  8:15 PM)  Result Value Ref Range   Enterococcus faecalis NOT DETECTED NOT DETECTED   Enterococcus Faecium NOT DETECTED NOT DETECTED   Listeria monocytogenes NOT DETECTED NOT DETECTED   Staphylococcus species NOT DETECTED NOT DETECTED   Staphylococcus aureus (BCID) NOT DETECTED NOT DETECTED   Staphylococcus epidermidis NOT DETECTED NOT DETECTED   Staphylococcus lugdunensis NOT DETECTED NOT DETECTED   Streptococcus species DETECTED (A) NOT DETECTED   Streptococcus agalactiae DETECTED (A) NOT DETECTED   Streptococcus pneumoniae NOT DETECTED NOT DETECTED   Streptococcus pyogenes NOT DETECTED NOT DETECTED   A.calcoaceticus-baumannii NOT DETECTED NOT DETECTED   Bacteroides fragilis NOT DETECTED NOT DETECTED   Enterobacterales NOT DETECTED NOT DETECTED   Enterobacter cloacae complex NOT DETECTED NOT DETECTED   Escherichia coli NOT DETECTED NOT DETECTED   Klebsiella aerogenes NOT DETECTED NOT DETECTED   Klebsiella oxytoca NOT DETECTED NOT DETECTED   Klebsiella pneumoniae NOT DETECTED NOT DETECTED   Proteus species NOT DETECTED NOT DETECTED   Salmonella species NOT DETECTED NOT DETECTED   Serratia marcescens NOT DETECTED NOT DETECTED   Haemophilus influenzae NOT DETECTED NOT DETECTED    Neisseria meningitidis NOT DETECTED NOT DETECTED   Pseudomonas aeruginosa NOT DETECTED NOT DETECTED   Stenotrophomonas maltophilia NOT DETECTED NOT DETECTED   Candida albicans NOT DETECTED NOT DETECTED   Candida auris NOT DETECTED NOT DETECTED   Candida glabrata NOT DETECTED NOT DETECTED   Candida krusei NOT DETECTED NOT DETECTED   Candida parapsilosis NOT DETECTED NOT DETECTED   Candida tropicalis NOT DETECTED NOT DETECTED   Cryptococcus neoformans/gattii NOT DETECTED NOT DETECTED    Mamie Laurel 10/18/2022  1:38 PM

## 2022-10-18 NOTE — Plan of Care (Signed)

## 2022-10-18 NOTE — Evaluation (Signed)
Occupational Therapy Evaluation Patient Details Name: [name] MRN: [id number] DOB: [birth year] Today's Date: 10/18/2022   History of Present Illness Pt is a 72 y.o. male admitted 10/17/22 with a "viral type illness" resulting in weakness, fatigue and fall calling EMS. Workup for CAP. PMH includes HTN, moderate AS, afib, HLD, OSA, morbid obesity.   Clinical Impression   Pt reports ind with ADLs at baseline, has nephew who is there frequently to assist with IADLs, uses RW for in home mobility, w/c for community mobility. Pt set up -max A for ADLs, minA +2 for bed mobility and minA for transfers with RW. Pt with mild unsteadiness, preference for turning RW backward to stand up and then turning forward to walk/transfer. Pt presenting with impairments listed below, will follow acutely. Recommend HHOT at d/c.        If plan is discharge home, recommend the following: A little help with walking and/or transfers;A lot of help with bathing/dressing/bathroom;Assist for transportation;Help with stairs or ramp for entrance;Assistance with cooking/housework    Functional Status Assessment  Patient has had a recent decline in their functional status and demonstrates the ability to make significant improvements in function in a reasonable and predictable amount of time.  Equipment Recommendations  None recommended by OT (pt has all needed DME)    Recommendations for Other Services PT consult     Precautions / Restrictions Precautions Precautions: Fall Restrictions Weight Bearing Restrictions: No      Mobility Bed Mobility Overal bed mobility: Needs Assistance         Sit to supine: Min assist, +2 for physical assistance   General bed mobility comments: assist for BLE's    Transfers Overall transfer level: Needs assistance Equipment used: Rolling walker (2 wheels) Transfers: Sit to/from Stand Sit to Stand: From elevated surface, Min assist           General transfer comment:  RW backwards initially for pt to pull up from, then turned around for transfer      Balance Overall balance assessment: Needs assistance Sitting-balance support: No upper extremity supported, Feet supported, Feet unsupported Sitting balance-Leahy Scale: Fair     Standing balance support: During functional activity, Single extremity supported, Bilateral upper extremity supported, Reliant on assistive device for balance Standing balance-Leahy Scale: Poor Standing balance comment: reliant on external supporrt                           ADL either performed or assessed with clinical judgement   ADL Overall ADL's : Needs assistance/impaired Eating/Feeding: Set up;Sitting   Grooming: Sitting;Contact guard assist   Upper Body Bathing: Moderate assistance;Sitting   Lower Body Bathing: Maximal assistance;Sitting/lateral leans   Upper Body Dressing : Moderate assistance;Sitting   Lower Body Dressing: Maximal assistance;Sitting/lateral leans   Toilet Transfer: Minimal assistance   Toileting- Clothing Manipulation and Hygiene: Maximal assistance       Functional mobility during ADLs: Minimal assistance;Rolling walker (2 wheels)       Vision   Vision Assessment?: No apparent visual deficits     Perception Perception: Not tested       Praxis Praxis: Not tested       Pertinent Vitals/Pain Pain Assessment Pain Assessment: Faces Pain Score: 6  Faces Pain Scale: Hurts even more Pain Location: back Pain Descriptors / Indicators: Discomfort, Grimacing Pain Intervention(s): Limited activity within patient's tolerance, Monitored during session, Repositioned     Extremity/Trunk Assessment Upper Extremity Assessment Upper Extremity Assessment:  Generalized weakness   Lower Extremity Assessment Lower Extremity Assessment: Defer to PT evaluation   Cervical / Trunk Assessment Cervical / Trunk Assessment: Normal   Communication Communication Communication: No  apparent difficulties   Cognition Arousal: Alert Behavior During Therapy: WFL for tasks assessed/performed Overall Cognitive Status: Within Functional Limits for tasks assessed                                       General Comments  VSS on RA, pt's nephew present    Exercises     Shoulder Instructions      Home Living Family/patient expects to be discharged to:: Private residence Living Arrangements: Alone Available Help at Discharge: Family;Available PRN/intermittently Type of Home: Apartment Home Access: Level entry     Home Layout: One level     Bathroom Shower/Tub: Tub/shower unit;Sponge bathes at baseline   Bathroom Toilet: Handicapped height Bathroom Accessibility: Yes   Home Equipment: Agricultural consultant (2 wheels);Toilet riser;Wheelchair - manual          Prior Functioning/Environment Prior Level of Function : Needs assist;Driving             Mobility Comments: mod indep household ambulation with RW. sleeps in recliner (elevated height but is not a lift chair). nephew checks on him frequently and assists with all community mobility/transportation. went to SNF for rehab after admission 08/2024, just had last HHPT session morning before/of admission ADLs Comments: ind with ADLs/IADLs, manages own medications, family provides transportation        OT Problem List: Decreased strength;Decreased range of motion;Decreased activity tolerance;Impaired balance (sitting and/or standing);Decreased safety awareness      OT Treatment/Interventions: Therapeutic exercise;Self-care/ADL training;Energy conservation;DME and/or AE instruction;Therapeutic activities;Balance training;Patient/family education    OT Goals(Current goals can be found in the care plan section) Acute Rehab OT Goals Patient Stated Goal: to rest OT Goal Formulation: With patient Time For Goal Achievement: 11/01/22 Potential to Achieve Goals: Good ADL Goals Pt Will Perform Upper Body  Dressing: with supervision;sitting Pt Will Perform Lower Body Dressing: with contact guard assist;sitting/lateral leans;sit to/from stand;with adaptive equipment Pt Will Transfer to Toilet: with supervision;ambulating;regular height toilet Additional ADL Goal #1: pt will be able to stand x5 min for functional task in order to improve activity tolerance for ADLs  OT Frequency: Min 1X/week    Co-evaluation              AM-PAC OT "6 Clicks" Daily Activity     Outcome Measure Help from another person eating meals?: None Help from another person taking care of personal grooming?: A Little Help from another person toileting, which includes using toliet, bedpan, or urinal?: A Lot Help from another person bathing (including washing, rinsing, drying)?: A Lot Help from another person to put on and taking off regular upper body clothing?: A Little Help from another person to put on and taking off regular lower body clothing?: A Lot 6 Click Score: 16   End of Session Equipment Utilized During Treatment: Gait belt;Rolling walker (2 wheels) Nurse Communication: Mobility status  Activity Tolerance: Patient tolerated treatment well Patient left: in bed;with call bell/phone within reach;with bed alarm set  OT Visit Diagnosis: Unsteadiness on feet (R26.81);Other abnormalities of gait and mobility (R26.89);Muscle weakness (generalized) (M62.81);History of falling (Z91.81)                Time: 4098-1191 OT Time Calculation (min): 21 min Charges:  OT General Charges $OT Visit: 1 Visit OT Evaluation $OT Eval Moderate Complexity: 1 Mod  Naly Schwanz K, OTD, OTR/L SecureChat Preferred Acute Rehab [phone]   Carver Fila Koonce 10/18/2022, 4:12 PM

## 2022-10-18 NOTE — Progress Notes (Signed)
ANTICOAGULATION CONSULT NOTE - Initial Consult  Pharmacy Consult for Warfarin  Indication: atrial fibrillation  Allergies  Allergen Reactions   Statins Other (See Comments)    "Muscles were messed up"    Penicillins Other (See Comments)    Childhood reaction. Unknown per Pt, cannot remember ever trying beta lactams     Vital Signs: Temp: 98.8 F (37.1 C) (08/12 2326) Temp Source: Oral (08/12 2326) BP: 120/84 (08/13 0000) Pulse Rate: 102 (08/13 0000)  Labs: Recent Labs    10/17/22 1929 10/17/22 1946  HGB 11.9* 11.6*  HCT 35.7* 34.0*  PLT 257  --   LABPROT 38.1*  --   INR 3.8*  --   CREATININE 1.09 1.10    CrCl cannot be calculated (Unknown ideal weight.).   Medical History: Past Medical History:  Diagnosis Date   Adenomatous colon polyp 07/2003   Heart valve problem    Dr [name]   Hypertension    Mitral valve disorders(424.0)    Nonrheumatic aortic valve stenosis 01/14/2021   Echo 9/22: EF 60-65, small pericardial effusion, mild AS (mean gradient 16 mmHg), trivial AI, mild MR (MR is eccentric - may be underestimated) // Echo 10/2021: EF 60-65, mod calcification of AV, trivial AI, mod-severe AS (DI 0.25, V-max 350 cm/s, mean 28.8), no RWMA, NL RVSF, severe LAE, mild RAE, small effusion w/o tamponade, ?mild-moderate MR (difficult due to eccentric jet)   Nonrheumatic mitral valve regurgitation 01/14/2021   Echo 10/2021: EF 60-65, moderate calcification of the aortic valve, trivial AI, moderate to severe aortic stenosis (DI 0.25, V-max 350 cm/s, mean gradient 28.8), no RWMA, normal RVSF, severe LAE, mild RAE, small pericardial effusion without tamponade, suspect mild-moderate MR (difficult to qualify degree due to eccentric jet with coanda affect)   Obesity    OSA (obstructive sleep apnea)    cpap   Other and unspecified hyperlipidemia    Permanent atrial fibrillation (HCC)    Zio Monitor 5/22: AFib - Avg HR 55; slowest 32; longest pause 3.9 sec; occ PVCs, 4 beat  NSVT   Sleep apnea     Assessment: 72 y/o M present to the ED with generalized weakness. On warfarin PTA for afib. INR is supra-therapeutic at 3.8.  Goal of Therapy:  INR 2-3 Monitor platelets by anticoagulation protocol: Yes   Plan:  Hold warfarin for now Daily PT/INR  Resume warfarin as INR allows  Abran Duke, PharmD, BCPS Clinical Pharmacist Phone: [phone]

## 2022-10-18 NOTE — Progress Notes (Signed)
Transition of Care [facility]) - Inpatient Brief Assessment   Patient Details  Name: [name] MRN: [id number] Date of Birth: [birth year]  [facility] CM/SW Contact:    Janae Bridgeman, RN Phone Number: 10/18/2022, 4:25 PM   Clinical Narrative: Patient admitted to the hospital with fall, sepsis and pneumonia.  Patient states that he lives alone and is currently active with [facility].  I called Amy Hyatt, RNCM with [facility] and they plan to continue services for [facility] at the home.  Patient states that his nephew, Christiane Ha assists with transportation to appointments and plans to provide transportation to home.  Home health orders will be placed.   Transition of Care Asessment: Insurance and Status: (P) Insurance coverage has been reviewed Patient has primary care physician: (P) Yes Home environment has been reviewed: (P) Yes  - Home alone Prior level of function:: (P) Independent  - lives alone Prior/Current Home Services: (P) Current home services (Active with [facility]) Social Determinants of Health Reivew: (P) SDOH reviewed interventions complete Readmission risk has been reviewed: (P) Yes Transition of care needs: (P) transition of care needs identified, TOC will continue to follow

## 2022-10-18 NOTE — Progress Notes (Addendum)
PROGRESS NOTE [name]  ZOX:[id number] DOB: 05-25-1950 DOA: 10/17/2022 PCP: [name], MD  Brief Narrative/Hospital Course: 50 yom w/ HTN, moderate aortic stenosis, mitral valve prolapse, atrial fibrillation, hyperlipidemia, OSA not on CPAP, HTN, morbid obesity who erarlier this week developed what he calls a viral type illness w/ shakes and chills which resolved but has become weaker and weaker since and eventually fell,did not hit his head and there was no loss of consciousness.  911 was called and brought to the ED.   In the ED: Tmax 100.6.,WBC 22.5, INR 3.8. Chest x-ray cardiomegaly with pulmonary vascular congestion, possibly edema versus infiltrate.  Lactic acid 5.0.  Doing well on room air, blood culture obtained Rocephin azithromycin given, UA unremarkable.  Initial troponin.  Bolus normal saline. Patient was admitted for further management Repeat lactic acid downtrending 3.4, Pro-Cal is up to 2.3 WBC downtrending 20.6.    Subjective: Seen and examined On RA.  Patient overnight was afebrile, vital stable saturating well on room air Lab this morning shows hypokalemia hyponatremia Lactic acid 3.4 BNP 465  Assessment and Plan: Principal Problem:   Pneumonia Active Problems:   Dyslipidemia   OBSTRUCTIVE SLEEP APNEA   ATRIAL FIBRILLATION   Nonrheumatic aortic valve stenosis   CAP (community acquired pneumonia)   HTN (hypertension)   CAD (coronary artery disease)  Severe sepsis POA due to pneumonia Patient admitted SIRS criteria in the ED with fever 100.6,HR 92,112, leukocytosis and lactic acidosis.  Received 2.5 L bolus, blood culture obtained and also placed on antibiotics ceftriaxone azithromycin.  Repeat lactic acid 3.4, recheck again, follow-up culture data continue to biotics.  This morning afebrile heart rate 93 blood pressure 170/98.  Hypokalemia: replaced  Anemia normocytic: appears chronic, monitor  Moderate AS/MV BNP 465 no signs of fluid overload last  echo was 12/15/28, monitor  CAD HLD Hypertension: No chest pain BP stable continue home Coreg, losartan and clonidine. Norvasc on hold. On Nexlizet at at home for hld.  Chronic atrial fibrillation: Rate controlled, continue Coumadin pharmacy to dose with serial INR, continue Coreg Recent Labs  Lab 10/17/22 1929 10/18/22 0617  INR 3.8* 3.3*    Constipation: continue laxatives as needed. Resume home lubipriustone  OSA: Does not use CPAP at home  Deconditioning/Debility: PTOT eval  Class I Obesity:Patient's Body mass index is 34.76 kg/m. : Will benefit with PCP follow-up, weight loss  healthy lifestyle and outpatient sleep evaluation.  Addendum: Pt was more short of breath in evening- cxr vbg ordered and lasix ordered after reviewing cxr, ivf stopped. I  asked Night MD to follow.  DVT prophylaxis: coumadin  Code Status:   Code Status: DNR Family Communication: plan of care discussed with patient at bedside. Patient status is:  inpatient  because of pneumonia Level of care: Telemetry Medical   Dispo: The patient is from: home alone, nephew close by            Anticipated disposition: TBD  Objective: Vitals last 24 hrs: Vitals:   10/18/22 0309 10/18/22 0400 10/18/22 0448 10/18/22 0739  BP: (!) 143/88 (!) 142/98 (!) 142/98 (!) 172/98  Pulse: 85 95 95 93  Resp: 16 16 16 19   Temp:  98 F (36.7 C) 98 F (36.7 C) 98.7 F (37.1 C)  TempSrc:  Oral Oral   SpO2: 98% 97%  96%  Weight:   109.9 kg   Height:   5\' 10"  (1.778 m)    Weight change:   Physical Examination: General exam: alert awake,  older than stated age,obese. HEENT:Oral mucosa moist, Ear/Nose WNL grossly Respiratory system: bilaterally diminished BS, no use of accessory muscle Cardiovascular system: S1 & S2 +, No JVD. Gastrointestinal system: Abdomen soft,NT,ND, BS+ Nervous System:Alert, awake, moving extremities. Extremities: LE edema NEG. Chronic skin changes Skin: No rashes,no icterus. MSK: Normal  muscle bulk,tone, power  Medications reviewed:  Scheduled Meds:  Bempedoic Acid-Ezetimibe  1 tablet Oral Daily   carvedilol  12.5 mg Oral BID WC   cloNIDine  0.2 mg Oral BID   doxazosin  4 mg Oral Daily   losartan  100 mg Oral Daily   lubiprostone  24 mcg Oral BID   omega-3 acid ethyl esters  1 g Oral Daily   warfarin  5 mg Oral ONCE-1600   Warfarin - Pharmacist Dosing Inpatient   Does not apply q1600   Continuous Infusions:  0.9 % NaCl with KCl 20 mEq / L 125 mL/hr at 10/18/22 0811   azithromycin Stopped (10/17/22 2258)   cefTRIAXone (ROCEPHIN)  IV        Diet Order             Diet Heart Room service appropriate? Yes; Fluid consistency: Thin  Diet effective now                  Intake/Output Summary (Last 24 hours) at 10/18/2022 1111 Last data filed at 10/18/2022 1036 Gross per 24 hour  Intake 1777 ml  Output 400 ml  Net 1377 ml   Net IO Since Admission: 1,377 mL [10/18/22 1111]  Wt Readings from Last 3 Encounters:  10/18/22 109.9 kg  09/09/22 108.4 kg  08/31/22 106.5 kg     Unresulted Labs (From admission, onward)     Start     Ordered   10/19/22 0500  Basic metabolic panel  Daily,   R      10/18/22 0749   10/19/22 0500  CBC  Daily,   R      10/18/22 0749   10/18/22 0500  Protime-INR  Daily,   R      10/18/22 0242   10/18/22 0309  Legionella Pneumophila Serogp 1 Ur Ag  Once,   AD        10/18/22 0309          Data Reviewed: I have personally reviewed following labs and imaging studies CBC: Recent Labs  Lab 10/17/22 1929 10/17/22 1946 10/18/22 0242  WBC 22.5*  --  20.6*  NEUTROABS 21.0*  --  19.0*  HGB 11.9* 11.6* 10.3*  HCT 35.7* 34.0* 30.8*  MCV 92.7  --  90.1  PLT 257  --  212   Basic Metabolic Panel: Recent Labs  Lab 10/17/22 1929 10/17/22 1946 10/18/22 0242  NA 133* 135 132*  K 3.4* 3.6 3.2*  CL 100 105 101  CO2 24  --  21*  GLUCOSE 124* 120* 103*  BUN 24* 25* 20  CREATININE 1.09 1.10 0.92  CALCIUM 10.7*  --  9.6    GFR: Estimated Creatinine Clearance: 90.1 mL/min (by C-G formula based on SCr of 0.92 mg/dL). Liver Function Tests: Recent Labs  Lab 10/17/22 1929  AST 60*  ALT 24  ALKPHOS 61  BILITOT 0.7  PROT 6.5  ALBUMIN 2.9*   Recent Labs  Lab 10/17/22 1929 10/18/22 0617  INR 3.8* 3.3*   Recent Labs  Lab 10/17/22 2059 10/18/22 0242 10/18/22 0246 10/18/22 0724 10/18/22 0924  PROCALCITON  --  2.35  --   --   --  LATICACIDVEN 5.0*  --  3.4* 1.3 1.0   Recent Results (from the past 240 hour(s))  SARS Coronavirus 2 by RT PCR (hospital order, performed in [facility] hospital lab) *cepheid single result test* Anterior Nasal Swab     Status: None   Collection Time: 10/17/22  7:29 PM   Specimen: Anterior Nasal Swab  Result Value Ref Range Status   SARS Coronavirus 2 by RT PCR NEGATIVE NEGATIVE Final    Comment: Performed at [facility], [address] [place], [place] [address]  Culture, blood (Routine X 2) w Reflex to ID Panel     Status: None (Preliminary result)   Collection Time: 10/17/22  8:15 PM   Specimen: BLOOD  Result Value Ref Range Status   Specimen Description BLOOD LEFT ANTECUBITAL  Final   Special Requests   Final    BOTTLES DRAWN AEROBIC AND ANAEROBIC Blood Culture adequate volume   Culture   Final    NO GROWTH < 12 HOURS Performed at [facility], [address] [place], [place] [address]    Report Status PENDING  Incomplete    Antimicrobials: Anti-infectives (From admission, onward)    Start     Dose/Rate Route Frequency Ordered Stop   10/18/22 1400  cefTRIAXone (ROCEPHIN) 2 g in sodium chloride 0.9 % 100 mL IVPB        2 g 200 mL/hr over 30 Minutes Intravenous Every 24 hours 10/18/22 1054     10/17/22 2045  azithromycin (ZITHROMAX) 500 mg in sodium chloride 0.9 % 250 mL IVPB        500 mg 250 mL/hr over 60 Minutes Intravenous Every 24 hours 10/17/22 2039     10/17/22 2045  cefTRIAXone (ROCEPHIN) 1 g in sodium chloride 0.9 % 100 mL IVPB   Status:  Discontinued        1 g 200 mL/hr over 30 Minutes Intravenous Every 24 hours 10/17/22 2039 10/18/22 1054      Culture/Microbiology    Component Value Date/Time   SDES BLOOD LEFT ANTECUBITAL 10/17/2022 2015   SPECREQUEST  10/17/2022 2015    BOTTLES DRAWN AEROBIC AND ANAEROBIC Blood Culture adequate volume   CULT  10/17/2022 2015    NO GROWTH < 12 HOURS Performed at [facility], [address] [place], [place] [address]    REPTSTATUS PENDING 10/17/2022 2015   Radiology Studies: [facility] Chest Port 1 View  Result Date: 10/17/2022 CLINICAL DATA:  Cough. EXAM: PORTABLE CHEST 1 VIEW COMPARISON:  08/11/2022. FINDINGS: The heart is enlarged and the mediastinal contour is stable. There is atherosclerotic calcification of the aorta. The pulmonary vasculature is mildly distended. Interstitial prominence is present bilaterally. Subsegmental atelectasis or scarring is noted bilaterally. No effusion or pneumothorax. No acute osseous abnormality. IMPRESSION: 1. Cardiomegaly with pulmonary vascular congestion. 2. Interstitial prominence bilaterally, possible edema or infiltrate. Electronically Signed   By: Thornell Sartorius M.D.   On: 10/17/2022 20:09     LOS: 1 day   Lanae Boast, MD [facility]  10/18/2022, 11:11 AM

## 2022-10-18 NOTE — Progress Notes (Signed)
   10/18/22 2200  BiPAP/CPAP/SIPAP  Reason BIPAP/CPAP not in use Non-compliant

## 2022-10-18 NOTE — Evaluation (Signed)
Physical Therapy Evaluation Patient Details Name: [name] MRN: [id number] DOB: 1951/02/19 Today's Date: 10/18/2022  History of Present Illness  Pt is a 72 y.o. male admitted 10/17/22 with a "viral type illness" resulting in weakness, fatigue and fall calling EMS. Workup for CAP. PMH includes HTN, moderate AS, afib, HLD, OSA, morbid obesity.   Clinical Impression  Pt presents with an overall decrease in functional mobility secondary to above. PTA, pt lives alone, mod indep household ambulator with RW, working with HHPT; pt's nephew lives nearby and assists with ADL/iADLs as needed. Today, pt requiring up to minA for bed mobility, able stand and take steps with RW, though pt declines further mobility secondary to fatigue having had no sleep. Pt would benefit from continued acute PT services to maximize functional mobility and independence prior to d/c with continued HHPT services.       If plan is discharge home, recommend the following: A little help with walking and/or transfers;A little help with bathing/dressing/bathroom;Assistance with cooking/housework;Assist for transportation;Help with stairs or ramp for entrance   Can travel by private vehicle    Yes    Equipment Recommendations None recommended by PT  Recommendations for Other Services       Functional Status Assessment Patient has had a recent decline in their functional status and demonstrates the ability to make significant improvements in function in a reasonable and predictable amount of time.     Precautions / Restrictions Precautions Precautions: Fall Restrictions Weight Bearing Restrictions: No      Mobility  Bed Mobility Overal bed mobility: Needs Assistance Bed Mobility: Supine to Sit, Sit to Supine     Supine to sit: Min assist Sit to supine: Min assist   General bed mobility comments: increased time and effort, requiring minA for HHA to elevate trunk; minA for LE management return to supine due to c/o  significant back pain upon laying flat    Transfers Overall transfer level: Needs assistance Equipment used: Rolling walker (2 wheels) Transfers: Sit to/from Stand Sit to Stand: Contact guard assist, From elevated surface           General transfer comment: pt preference for his RW to be turned backwards to stand, able to do so from slightly elevated bed height with standby assist; good eccentric control to sit    Ambulation/Gait Ambulation/Gait assistance: Contact guard assist Gait Distance (Feet): 2 Feet Assistive device: Rolling walker (2 wheels) Gait Pattern/deviations: Step-to pattern, Trunk flexed, Antalgic     Pre-gait activities: side steps towards HOB with BUE support on front of RW (turned backwards) and standby assist; pt declines further distance secondary to fatigue    Stairs            Wheelchair Mobility     Tilt Bed    Modified Rankin (Stroke Patients Only)       Balance Overall balance assessment: Needs assistance Sitting-balance support: No upper extremity supported, Feet supported, Feet unsupported Sitting balance-Leahy Scale: Fair Sitting balance - Comments: assist to don/doff shoes sitting EOB   Standing balance support: During functional activity, Single extremity supported, Bilateral upper extremity supported, Reliant on assistive device for balance Standing balance-Leahy Scale: Poor                               Pertinent Vitals/Pain Pain Assessment Pain Assessment: Faces Faces Pain Scale: Hurts even more Pain Location: back Pain Descriptors / Indicators: Discomfort, Grimacing Pain Intervention(s): Monitored during  session, Limited activity within patient's tolerance, Repositioned    Home Living Family/patient expects to be discharged to:: Private residence Living Arrangements: Alone Available Help at Discharge: Family;Available 24 hours/day Type of Home: Apartment Home Access: Level entry       Home Layout:  One level Home Equipment: Agricultural consultant (2 wheels)      Prior Function Prior Level of Function : Needs assist             Mobility Comments: mod indep household ambulation with RW. sleeps in recliner (elevated height but is not a lift chair). nephew checks on him frequently and assists with all community mobility/transportation. went to SNF for rehab after admission 08/2024, just had last HHPT session morning before/of admission ADLs Comments: mod indep with light iADLs and ADLs. sponge bathes, has groceries delivered, able to prep his own meals. nephew helps pt soak his feet 1x/wk and check for sores, otherwise pt reports wearing his tennis shoes majority of time     Extremity/Trunk Assessment   Upper Extremity Assessment Upper Extremity Assessment: Generalized weakness    Lower Extremity Assessment Lower Extremity Assessment: Generalized weakness       Communication      Cognition Arousal: Alert Behavior During Therapy: WFL for tasks assessed/performed Overall Cognitive Status: Within Functional Limits for tasks assessed                                          General Comments General comments (skin integrity, edema, etc.): pt's nephew present at end of session, supportive. educ re: POC, activity recommendations, importance of OOB mobility, discharge recmomendations. pt adamantly declines further mobility or sitting up in recliner secondary to fatigue stating he hasn't gotten any sleep    Exercises     Assessment/Plan    PT Assessment Patient needs continued PT services  PT Problem List Decreased strength;Decreased activity tolerance;Decreased balance;Decreased mobility;Pain       PT Treatment Interventions DME instruction;Gait training;Functional mobility training;Therapeutic activities;Therapeutic exercise;Balance training;Patient/family education    PT Goals (Current goals can be found in the Care Plan section)  Acute Rehab PT Goals Patient  Stated Goal: return home PT Goal Formulation: With patient Time For Goal Achievement: 11/01/22 Potential to Achieve Goals: Good    Frequency Min 1X/week     Co-evaluation               AM-PAC PT "6 Clicks" Mobility  Outcome Measure Help needed turning from your back to your side while in a flat bed without using bedrails?: A Little Help needed moving from lying on your back to sitting on the side of a flat bed without using bedrails?: A Little Help needed moving to and from a bed to a chair (including a wheelchair)?: A Little Help needed standing up from a chair using your arms (e.g., wheelchair or bedside chair)?: A Little Help needed to walk in hospital room?: A Little Help needed climbing 3-5 steps with a railing? : A Lot 6 Click Score: 17    End of Session   Activity Tolerance: Patient tolerated treatment well;Patient limited by fatigue Patient left: in bed;with call bell/phone within reach;with bed alarm set;with family/visitor present Nurse Communication: Mobility status PT Visit Diagnosis: Other abnormalities of gait and mobility (R26.89);Muscle weakness (generalized) (M62.81)    Time: 1610-9604 PT Time Calculation (min) (ACUTE ONLY): 25 min   Charges:   PT Evaluation $PT  Eval Moderate Complexity: 1 Mod PT Treatments $Therapeutic Activity: 8-22 mins PT General Charges $$ ACUTE PT VISIT: 1 Visit       Ina Homes, PT, DPT Acute Rehabilitation Services  Personal: Secure Chat Rehab Office: [phone]  Malachy Chamber 10/18/2022, 12:24 PM

## 2022-10-18 NOTE — Hospital Course (Addendum)
The patient is a 72 year old Caucasian male with a past medical history significant for but not to HTN, moderate aortic stenosis, mitral valve prolapse, atrial fibrillation, hyperlipidemia, OSA not on CPAP, Obesity as well as other comorbidities who earlier in the week developed what he does describes as a viral illness with shakes and chills but then subsequently resolved.  He has not become weaker and weaker since and eventually fell but did not hit his head and there is no loss of consciousness.  Given his fall he is brought to the ED after he called 911 and in the ED he was found to be febrile.  Chest x-ray was done and showed cardiomegaly with pulmonary vascular congestion and possible edema versus infiltrate.  Lactic acid level was elevated at 5 and he was given IV Rocephin and azithromycin.  He is admitted for further workup and lactic acid started downtrending and WBC is now resolved but blood cultures came back positive for Streptococcus.  He is now being treated for streptococcal bacteremia and he was placed on IV Cefazolin and further workup reveals likely the source of the infection is suspected to be dental with peridental disease with dental caries and multiple erosions.  His IV cefazolin has now been changed to oral amoxicillin to bridge maxillofacial evaluation and possible dental extraction.  Given his deconditioning PT OT have evaluated and recommending SNF and he is now agreeable but was at first hesitant to go to rehab. He is medically stable to D/C and will need follow up with PCP, ID, Palliative Care Medicine and Cardiology in the outpatient setting.   Assessment and Plan:  Sepsis Secondary to Streptococcal bacteremia and community-acquired pneumonia:  -Vitals are stable this a.m. -Leukocytosis resolved.  TTE and TEE negative for endocarditis -ID recommendations: Continue IV cefazolin 2 g every 8 hours but now changing to oral Amoxicillin 1000 mg po q8h -Continue Monitor Vitals -ID  ordered imaging of teeth and it showed "Periodontal disease and alveolar resorption with exposure of the roots of the maxillary and mandibular dentition. Superimposed dental caries with multiple erosions within the residual maxillary molars. No periapical lucency identified." -ID also pending referral to [facility] for evaluation of his dental caries and anticipating extractions and likely will need a maxillofacial surgeon -PT OT recommending SNF and Anticipating D/C when Bed is avavilable and Insurance Auth obtained  -Wants to speak with Palliative Care so Consult has been placed and goals of care being elucidated and patient will continue to manage current infection with current antibiotics and is leaning towards not having a dental extractions.  Palliative follow-up at SNF and patient's long-term goals are to be comfortable and not escalate care   Dyspnea and Hypoxia in the setting of Streptococcal PNA -Improved, now on room air SpO2: 96 % O2 Flow Rate (L/min): 2 L/min -Give incentive spirometry -Continuous Pulse Oximetry and Maintain O2 Sat's greater than 90% -Continue Supplemental O2 via [place] and Wean O2 as Tolerated -Will need an Ambulatory Home O2 Screen prior to D/C -Repeat CXR yesterday AM done and showed "No evidence of acute cardiopulmonary disease. Improved/resolved probable interstitial edema."   Normocytic Anemia -Chronic. Hgb/Hct Trend: Recent Labs  Lab 10/21/22 0911 10/22/22 0651 10/23/22 0555 10/25/22 0431 10/26/22 0537 10/27/22 0014 10/28/22 0854  HGB 10.4* 10.5* 10.1* 10.4* 10.8* 10.6* 10.7*  HCT 31.1* 31.7* 30.9* 32.2* 33.3* 32.6* 33.3*  MCV 88.9 89.3 91.4 91.5 92.0 93.1 90.5  -Checked Anemia Panel and showed an iron level of 19, UIBC of 282, TIBC of  301, saturation ratios of 6%, ferritin level of 141, folate level 5.4, vitamin B12 level of 149 -Will start p.o. Niferex, p.o. folic acid supplementation and B12 supplementation -Continue to Monitor for S/Sx of Bleeding; No  overt Bleeding noted -Repeat CBC in the AM  Hypokalemia, improved -Patient's K+ Level Trend: Recent Labs  Lab 10/21/22 0911 10/22/22 0651 10/23/22 0555 10/25/22 0431 10/26/22 0537 10/27/22 0014 10/28/22 0854  K 3.0* 4.4 4.9 4.0 4.3 4.5 3.7  -Continue to Monitor and Replete as Necessary -Repeat CMP in the AM    Obstructive Sleep Apnea -Give incentive spirometry -Does not use CPAP at home. -Would benefit from CPAP at bedtime.   Chronic Atrial Fibrillation -Rate controlled -CHA?DS?-VASc of at least 3. -INR is therapeutic at 1.9 this a.m. -Continue Warfarin per Pharmacy and he will receive a 7.5 mg dose today -Follow-up PT/INR -On Carvedilol 12.5 mg po BID -Continue to Monitor on Telemetry  Hyponatremia -In the setting of Hydrochlorothiazide use -Will Hold and Discontinue Hydrochlorothiazide -Na+ Trend:  Recent Labs  Lab 10/21/22 0911 10/22/22 0651 10/23/22 0555 10/25/22 0431 10/26/22 0537 10/27/22 0014 10/28/22 0854  NA 135 137 134* 134* 133* 132* 134*  -Replete with IV Sodium Phos 15 mmol  -Continue to Monitor and Trend and repeat CMP in the AM  Hypophosphatemia -Phos Level Trend: Recent Labs  Lab 10/21/22 0907 10/26/22 0537 10/27/22 0014 10/28/22 0854  PHOS 1.5* 2.9 2.3* 2.3*  -Replete with IV Sodium Phos 15 mmol again  -Continue to Monitor and Replete as Necessary -Repeat Phos Level in the AM   Nonrheumatic aortic valve stenosis -Continue Coumadin   CAD (coronary artery disease) -No acute chest pain symptoms -On warfarin per pharmacy. -Continue Losartan 100 mg p.o. daily and Carvedilol 12.5 mg po BID -Continue to Monitor and Trend   HTN (Hypertension) -Mildly elevated blood pressure. -Continue ARB with Losartan 100 mg po Daily, Clonidine 0.2 mg po BID, Carvedilol 12.5 mg po BID, and Doxazosin 4 mg po Daily  -Will Discontinue Hydrochlorothiazide 12.5 mg p.o. daily due to Hyponatremia -Continue to Monitor BP per Protocol and Last BP reading was  on the softer side 103/66   Aortic Dilatation Moderate aortic stenosis Mitral regurgitation -EF 60-65% no RWMA, small pericardial effusion,  -Mitral valve is normal and Aortic valve is normal  -Mild dilatation of aortic root and ascending aorta no interatrial shunt.  -Needs Cardiology Follow up at DC    Dyslipidemia -Currently not on medical therapy except Omega-3 Acid Ethyl Esters 1 gram po Daily -Follow-up with PCP.  Constipation -C/w Senna-docusate 1 tab po at bedtime prn and will add Bisacodyl 10 mg RC Dailyprn Moderate Constipation; Will try another Suppository  -C/w Lubiprostone 24 mcg po BID -Has an Adverse Effect to Miralax so will avoid    Physical Debility/Generalized Deconditioning:  -PT recommended home health as patient was declining rehab.   -Unable to take care of himself and Likely will need rehab and now PT/OT recommending SNF and Nephew and Patient in agreement  Hypoalbuminemia -Patient's Albumin Trend: Recent Labs  Lab 10/17/22 1929 10/26/22 0537 10/27/22 0014 10/28/22 0854  ALBUMIN 2.9* 2.4* 2.4* 2.5*  -Continue to Monitor and Trend and repeat CMP in the AM  Obesity -Complicates overall prognosis and care -Estimated body mass index is 33 kg/m as calculated from the following:   Height as of this encounter: 5\' 10"  (1.778 m).   Weight as of this encounter: 104.3 kg.  -Weight Loss and Dietary Counseling given

## 2022-10-19 ENCOUNTER — Inpatient Hospital Stay (HOSPITAL_COMMUNITY): Payer: PPO

## 2022-10-19 DIAGNOSIS — I4821 Permanent atrial fibrillation: Secondary | ICD-10-CM | POA: Diagnosis not present

## 2022-10-19 DIAGNOSIS — R8271 Bacteriuria: Secondary | ICD-10-CM

## 2022-10-19 DIAGNOSIS — I2510 Atherosclerotic heart disease of native coronary artery without angina pectoris: Secondary | ICD-10-CM

## 2022-10-19 DIAGNOSIS — L03113 Cellulitis of right upper limb: Secondary | ICD-10-CM

## 2022-10-19 DIAGNOSIS — I2583 Coronary atherosclerosis due to lipid rich plaque: Secondary | ICD-10-CM

## 2022-10-19 DIAGNOSIS — A401 Sepsis due to streptococcus, group B: Secondary | ICD-10-CM | POA: Diagnosis present

## 2022-10-19 DIAGNOSIS — J189 Pneumonia, unspecified organism: Secondary | ICD-10-CM | POA: Diagnosis not present

## 2022-10-19 LAB — BRAIN NATRIURETIC PEPTIDE: B Natriuretic Peptide: 403.7 pg/mL — ABNORMAL HIGH (ref 0.0–100.0)

## 2022-10-19 LAB — GLUCOSE, CAPILLARY
Glucose-Capillary: 108 mg/dL — ABNORMAL HIGH (ref 70–99)
Glucose-Capillary: 118 mg/dL — ABNORMAL HIGH (ref 70–99)
Glucose-Capillary: 120 mg/dL — ABNORMAL HIGH (ref 70–99)
Glucose-Capillary: 139 mg/dL — ABNORMAL HIGH (ref 70–99)

## 2022-10-19 MED ORDER — HYDROCHLOROTHIAZIDE 12.5 MG PO TABS
12.5000 mg | ORAL_TABLET | Freq: Every day | ORAL | Status: DC
  Administered 2022-10-19 – 2022-10-27 (×9): 12.5 mg via ORAL
  Filled 2022-10-19 (×9): qty 1

## 2022-10-19 MED ORDER — AMOXICILLIN 500 MG PO CAPS
500.0000 mg | ORAL_CAPSULE | Freq: Once | ORAL | Status: AC
  Administered 2022-10-19: 500 mg via ORAL
  Filled 2022-10-19: qty 1

## 2022-10-19 MED ORDER — BISACODYL 10 MG RE SUPP
10.0000 mg | Freq: Once | RECTAL | Status: AC
  Administered 2022-10-19: 10 mg via RECTAL
  Filled 2022-10-19: qty 1

## 2022-10-19 MED ORDER — DIPHENHYDRAMINE HCL 50 MG/ML IJ SOLN
25.0000 mg | Freq: Once | INTRAMUSCULAR | Status: DC | PRN
  Filled 2022-10-19 (×2): qty 1

## 2022-10-19 MED ORDER — LOSARTAN POTASSIUM 50 MG PO TABS
100.0000 mg | ORAL_TABLET | Freq: Every day | ORAL | Status: DC
  Administered 2022-10-19 – 2022-10-30 (×12): 100 mg via ORAL
  Filled 2022-10-19 (×12): qty 2

## 2022-10-19 MED ORDER — EPINEPHRINE 0.3 MG/0.3ML IJ SOAJ
0.3000 mg | Freq: Once | INTRAMUSCULAR | Status: DC | PRN
  Filled 2022-10-19: qty 0.3

## 2022-10-19 NOTE — Progress Notes (Signed)
PROGRESS NOTE [name]  VQQ:[id number] DOB: May 14, 1950 DOA: 10/17/2022 PCP: [name], MD  Brief Narrative/Hospital Course: 27 yom w/ HTN, moderate aortic stenosis, mitral valve prolapse, atrial fibrillation, hyperlipidemia, OSA not on CPAP, HTN, morbid obesity who erarlier this week developed what he calls a viral type illness w/ shakes and chills which resolved but has become weaker and weaker since and eventually fell,did not hit his head and there was no loss of consciousness.  911 was called and brought to the ED. In the ED: Tmax 100.6.,WBC 22.5, INR 3.8. Chest x-ray cardiomegaly with pulmonary vascular congestion, possibly edema versus infiltrate.  Lactic acid 5.0.  Doing well on room air, blood culture obtained Rocephin azithromycin given, UA unremarkable.  Initial troponin.  Bolus normal saline. Patient was admitted for further management Repeat lactic acid downtrending 3.4, Pro-Cal is up to 2.3 WBC downtrending 20.6. 8/13: Blood culture came back positive for Streptococcus    Subjective: Seen this am Overnight vitals stayed stable After Lasix dose last night patient respiratory status improved and was doing well onRA, but vbg with low o2 and placed on 2l  [place] He is voided well Labs this morning CBC with improving leukocytosis 14.1  Assessment and Plan: Principal Problem:   Pneumonia Active Problems:   Dyslipidemia   OBSTRUCTIVE SLEEP APNEA   ATRIAL FIBRILLATION   Nonrheumatic aortic valve stenosis   CAP (community acquired pneumonia)   HTN (hypertension)   CAD (coronary artery disease)  Severe sepsis POA w/ GBS bacteremia  Possible community-acquired pneumonia : admitted SIRS criteria in the ED with fever 100.6,HR 92,112, leukocytosis 22k,lactic acidosis 5.0, procal 2.5 >received 2.5 L bolus, blood culture +  1/2 for GBS.Vitals overall stable, lactic acidosis resolved. Chest x-ray with interstitial prominence bilaterally possible edema or infiltrate. Strep pneumonia  antigen negative, Legionella antigen pending WBC downtrending vitals stable.  Monitor closely. Continue Ancef IV-will c/s ID today  Acute respiratory failure with hypoxia Shortness of breath Elevated BNP Moderate AS/MR BNP 465 no signs of fluid overload last echo was 12/15/28, monitor: Patient had worsening of shortness of breath likely multifactorial in the setting of pneumonia concern for fluid overload CHF.  Improving with Lasix, Echocardiogram pending.Monitor fluid status.  Hypokalemia: Resolved Recent Labs  Lab 10/17/22 1929 10/17/22 1946 10/18/22 0242 10/19/22 0734  K 3.4* 3.6 3.2* 3.6    Anemia normocytic: appears chronic, monitor Recent Labs  Lab 10/17/22 1929 10/17/22 1946 10/18/22 0242 10/19/22 0734  HGB 11.9* 11.6* 10.3* 10.7*  HCT 35.7* 34.0* 30.8* 32.1*    CAD HLD Hypertension: No chest pain BP poorly controlled continue Coreg clonidine Cardura losartan and resume HCTZ  and monitor.  Chronic atrial fibrillation: Rate controlled, continue Coumadin pharmacy to dose with serial INR, supratherapeutic.Continue Coreg Recent Labs  Lab 10/17/22 1929 10/18/22 0617 10/19/22 0734  INR 3.8* 3.3* 4.4*    Chronic constipation: continue laxatives as needed, cont lubipriustone  OSA: Does not use CPAP at home  Deconditioning/Debility: PTOT eval  Class I Obesity:Patient's Body mass index is 34.76 kg/m. : Will benefit with PCP follow-up, weight loss  healthy lifestyle and outpatient sleep evaluation.  DVT prophylaxis: coumadin  Code Status:   Code Status: DNR Family Communication: plan of care discussed with patient at bedside. Patient status is:  inpatient  because of pneumonia Level of care: Telemetry Medical   Dispo: The patient is from: home alone, nephew close by            Anticipated disposition: TBD  Objective: Vitals last  24 hrs: Vitals:   10/18/22 2008 10/19/22 0400 10/19/22 0620 10/19/22 0800  BP: (!) 154/90 (!) 157/96  (!) 171/100  Pulse:   95 96 98  Resp:    19  Temp:  98.6 F (37 C)  98.2 F (36.8 C)  TempSrc:  Oral    SpO2:  98% 92% 94%  Weight:      Height:       Weight change:   Physical Examination: General exam: alert awake, orientedx3, obese HEENT:Oral mucosa moist, Ear/Nose WNL grossly Respiratory system: Bilaterally clear BS-diminished at bases,no use of accessory muscle Cardiovascular system: S1 & S2 +, No JVD. Gastrointestinal system: Abdomen soft,NT,ND, BS+ Nervous System: Alert, awake, moving all extremities,and following commands. Extremities: LE edema neg,distal peripheral pulses palpable and warm.  Skin: No rashes,no icterus. MSK: Normal muscle bulk,tone, power   Medications reviewed:  Scheduled Meds:  carvedilol  12.5 mg Oral BID WC   cloNIDine  0.2 mg Oral BID   doxazosin  4 mg Oral Daily   hydrochlorothiazide  12.5 mg Oral Daily   losartan  100 mg Oral Daily   lubiprostone  24 mcg Oral BID   omega-3 acid ethyl esters  1 g Oral Daily   Warfarin - Pharmacist Dosing Inpatient   Does not apply q1600   Continuous Infusions:   ceFAZolin (ANCEF) IV        Diet Order             Diet Heart Room service appropriate? Yes; Fluid consistency: Thin  Diet effective now                  Intake/Output Summary (Last 24 hours) at 10/19/2022 0857 Last data filed at 10/19/2022 0814 Gross per 24 hour  Intake 177 ml  Output 1300 ml  Net -1123 ml   Net IO Since Admission: 227 mL [10/19/22 0857]  Wt Readings from Last 3 Encounters:  10/18/22 109.9 kg  09/09/22 108.4 kg  08/31/22 106.5 kg     Unresulted Labs (From admission, onward)     Start     Ordered   10/19/22 0806  Brain natriuretic peptide  Add-on,   AD        10/19/22 0805   10/19/22 0500  Basic metabolic panel  Daily,   R      10/18/22 0749   10/19/22 0500  CBC  Daily,   R      10/18/22 0749   10/18/22 0500  Protime-INR  Daily,   R      10/18/22 0242   10/18/22 0309  Legionella Pneumophila Serogp 1 Ur Ag  Once,   AD         10/18/22 0309          Data Reviewed: I have personally reviewed following labs and imaging studies CBC: Recent Labs  Lab 10/17/22 1929 10/17/22 1946 10/18/22 0242 10/19/22 0734  WBC 22.5*  --  20.6* 14.1*  NEUTROABS 21.0*  --  19.0*  --   HGB 11.9* 11.6* 10.3* 10.7*  HCT 35.7* 34.0* 30.8* 32.1*  MCV 92.7  --  90.1 89.4  PLT 257  --  212 239   Basic Metabolic Panel: Recent Labs  Lab 10/17/22 1929 10/17/22 1946 10/18/22 0242 10/19/22 0734  NA 133* 135 132* 135  K 3.4* 3.6 3.2* 3.6  CL 100 105 101 103  CO2 24  --  21* 23  GLUCOSE 124* 120* 103* 106*  BUN 24* 25* 20 21  CREATININE  1.09 1.10 0.92 0.99  CALCIUM 10.7*  --  9.6 10.1   GFR: Estimated Creatinine Clearance: 83.8 mL/min (by C-G formula based on SCr of 0.99 mg/dL). Liver Function Tests: Recent Labs  Lab 10/17/22 1929  AST 60*  ALT 24  ALKPHOS 61  BILITOT 0.7  PROT 6.5  ALBUMIN 2.9*   Recent Labs  Lab 10/17/22 1929 10/18/22 0617 10/19/22 0734  INR 3.8* 3.3* 4.4*   Recent Labs  Lab 10/17/22 2059 10/18/22 0242 10/18/22 0246 10/18/22 0724 10/18/22 0924  PROCALCITON  --  2.35  --   --   --   LATICACIDVEN 5.0*  --  3.4* 1.3 1.0   Recent Results (from the past 240 hour(s))  SARS Coronavirus 2 by RT PCR (hospital order, performed in [facility] hospital lab) *cepheid single result test* Anterior Nasal Swab     Status: None   Collection Time: 10/17/22  7:29 PM   Specimen: Anterior Nasal Swab  Result Value Ref Range Status   SARS Coronavirus 2 by RT PCR NEGATIVE NEGATIVE Final    Comment: Performed at [facility], [address], [place], [place] [address]  Culture, blood (Routine X 2) w Reflex to ID Panel     Status: None (Preliminary result)   Collection Time: 10/17/22  8:15 PM   Specimen: BLOOD  Result Value Ref Range Status   Specimen Description BLOOD LEFT ANTECUBITAL  Final   Special Requests   Final    BOTTLES DRAWN AEROBIC AND ANAEROBIC Blood Culture adequate volume   Culture   Setup Time   Final    GRAM POSITIVE COCCI IN CHAINS AEROBIC BOTTLE ONLY Organism ID to follow CRITICAL RESULT CALLED TO, READ BACK BY AND VERIFIED WITH: Joellen Jersey PHARMD, AT 1331 10/18/22 Renato Shin Performed at [facility], [address], [place], [place] [address]    Culture GRAM POSITIVE COCCI  Final   Report Status PENDING  Incomplete  Blood Culture ID Panel (Reflexed)     Status: Abnormal   Collection Time: 10/17/22  8:15 PM  Result Value Ref Range Status   Enterococcus faecalis NOT DETECTED NOT DETECTED Final   Enterococcus Faecium NOT DETECTED NOT DETECTED Final   Listeria monocytogenes NOT DETECTED NOT DETECTED Final   Staphylococcus species NOT DETECTED NOT DETECTED Final   Staphylococcus aureus (BCID) NOT DETECTED NOT DETECTED Final   Staphylococcus epidermidis NOT DETECTED NOT DETECTED Final   Staphylococcus lugdunensis NOT DETECTED NOT DETECTED Final   Streptococcus species DETECTED (A) NOT DETECTED Final    Comment: CRITICAL RESULT CALLED TO, READ BACK BY AND VERIFIED WITH: S. MOODY PHARMD, AT 1331 10/18/22 D. VANHOOK    Streptococcus agalactiae DETECTED (A) NOT DETECTED Final    Comment: CRITICAL RESULT CALLED TO, READ BACK BY AND VERIFIED WITH: S. MOODY PHARMD, AT 1331 10/18/22 D. VANHOOK    Streptococcus pneumoniae NOT DETECTED NOT DETECTED Final   Streptococcus pyogenes NOT DETECTED NOT DETECTED Final   A.calcoaceticus-baumannii NOT DETECTED NOT DETECTED Final   Bacteroides fragilis NOT DETECTED NOT DETECTED Final   Enterobacterales NOT DETECTED NOT DETECTED Final   Enterobacter cloacae complex NOT DETECTED NOT DETECTED Final   Escherichia coli NOT DETECTED NOT DETECTED Final   Klebsiella aerogenes NOT DETECTED NOT DETECTED Final   Klebsiella oxytoca NOT DETECTED NOT DETECTED Final   Klebsiella pneumoniae NOT DETECTED NOT DETECTED Final   Proteus species NOT DETECTED NOT DETECTED Final   Salmonella species NOT DETECTED NOT DETECTED Final   Serratia  marcescens NOT DETECTED NOT DETECTED Final   Haemophilus influenzae NOT DETECTED NOT DETECTED Final   Neisseria meningitidis NOT DETECTED NOT DETECTED Final   Pseudomonas aeruginosa NOT DETECTED NOT DETECTED Final   Stenotrophomonas maltophilia NOT DETECTED NOT DETECTED Final   Candida albicans NOT DETECTED NOT DETECTED Final   Candida auris NOT DETECTED NOT DETECTED Final   Candida glabrata NOT DETECTED NOT DETECTED Final   Candida krusei NOT DETECTED NOT DETECTED Final   Candida parapsilosis NOT DETECTED NOT DETECTED Final   Candida tropicalis NOT DETECTED NOT DETECTED Final   Cryptococcus neoformans/gattii NOT DETECTED NOT DETECTED Final    Comment: Performed at [facility], [address], [place], [place] [address]    Antimicrobials: Anti-infectives (From admission, onward)    Start     Dose/Rate Route Frequency Ordered Stop   10/19/22 1400  ceFAZolin (ANCEF) IVPB 2g/100 mL premix        2 g 200 mL/hr over 30 Minutes Intravenous Every 8 hours 10/18/22 1441     10/18/22 1400  cefTRIAXone (ROCEPHIN) 2 g in sodium chloride 0.9 % 100 mL IVPB  Status:  Discontinued        2 g 200 mL/hr over 30 Minutes Intravenous Every 24 hours 10/18/22 1054 10/18/22 1441   10/17/22 2045  azithromycin (ZITHROMAX) 500 mg in sodium chloride 0.9 % 250 mL IVPB  Status:  Discontinued        500 mg 250 mL/hr over 60 Minutes Intravenous Every 24 hours 10/17/22 2039 10/18/22 1441   10/17/22 2045  cefTRIAXone (ROCEPHIN) 1 g in sodium chloride 0.9 % 100 mL IVPB  Status:  Discontinued        1 g 200 mL/hr over 30 Minutes Intravenous Every 24 hours 10/17/22 2039 10/18/22 1054      Culture/Microbiology    Component Value Date/Time   SDES BLOOD LEFT ANTECUBITAL 10/17/2022 2015   SPECREQUEST  10/17/2022 2015    BOTTLES DRAWN AEROBIC AND ANAEROBIC Blood Culture adequate volume   CULT GRAM POSITIVE COCCI 10/17/2022 2015   REPTSTATUS PENDING 10/17/2022 2015   Radiology Studies: [facility] Chest Port 1  View  Result Date: 10/18/2022 CLINICAL DATA:  Shortness of breath EXAM: PORTABLE CHEST 1 VIEW COMPARISON:  10/17/2022 FINDINGS: Cardiac enlargement. Diffuse interstitial pattern to the lungs likely representing edema. Similar appearance to previous study. No pleural effusion or pneumothorax. Mediastinal contours appear intact. IMPRESSION: Cardiac enlargement with diffuse interstitial pattern to the lungs likely edema. No change. Electronically Signed   By: Burman Nieves M.D.   On: 10/18/2022 18:54   DG Chest Port 1 View  Result Date: 10/17/2022 CLINICAL DATA:  Cough. EXAM: PORTABLE CHEST 1 VIEW COMPARISON:  08/11/2022. FINDINGS: The heart is enlarged and the mediastinal contour is stable. There is atherosclerotic calcification of the aorta. The pulmonary vasculature is mildly distended. Interstitial prominence is present bilaterally. Subsegmental atelectasis or scarring is noted bilaterally. No effusion or pneumothorax. No acute osseous abnormality. IMPRESSION: 1. Cardiomegaly with pulmonary vascular congestion. 2. Interstitial prominence bilaterally, possible edema or infiltrate. Electronically Signed   By: Thornell Sartorius M.D.   On: 10/17/2022 20:09     LOS: 2 days   Lanae Boast, MD [facility]  10/19/2022, 8:57 AM

## 2022-10-19 NOTE — Consult Note (Addendum)
Date of Admission:  10/17/2022          Reason for Consult: Group B streptococcal bacteremia    Referring Provider: Lanae Boast, MD   Assessment:  Group B streptococcal bacteremia with sepsis ? Cutaneous sourcew due to cellulitis right leg (exam is not overwhelming) Valvular heart disease and given #1 concern for possible native valve endocarditis Atrial fibrillation OSA ? CAP   Plan:  Continue cefazolin Amoxicillin challenge  TTE without endocarditis and will need to consider TEE though patient himself is not too keen on this Ensure screened for HIV and viral hepatides   Principal Problem:   Sepsis due to Streptococcus, group B (HCC) Active Problems:   Dyslipidemia   OBSTRUCTIVE SLEEP APNEA   ATRIAL FIBRILLATION   Nonrheumatic aortic valve stenosis   CAP (community acquired pneumonia)   HTN (hypertension)   CAD (coronary artery disease)   Pneumonia   Scheduled Meds:  carvedilol  12.5 mg Oral BID WC   cloNIDine  0.2 mg Oral BID   doxazosin  4 mg Oral Daily   hydrochlorothiazide  12.5 mg Oral Daily   losartan  100 mg Oral Daily   lubiprostone  24 mcg Oral BID   omega-3 acid ethyl esters  1 g Oral Daily   Warfarin - Pharmacist Dosing Inpatient   Does not apply q1600   Continuous Infusions:   ceFAZolin (ANCEF) IV     PRN Meds:.acetaminophen **OR** acetaminophen, albuterol, diphenhydrAMINE, EPINEPHrine, oxymetazoline, senna-docusate  HPI: [name] is a 72 y.o. male w hx of moderate aortic stenosis, MV prolapse, atrial fibrillation, hyperlipidemia, OSA, CPAP, who had began developing some chills and lightheadedness.  He then developed acute onset of weakness of his upper extremities and had several falls.  He denied passing out or losing consciousness.  In the ER he was found to be slightly febrile 100.6 and with a white count of 22,000.  Chest x-ray showed cardiomegaly and some vascular congestion versus interstitial infiltrates.  Blood cultures  were taken  Started on ceftriaxone azithromycin for presumptive community-acquired pneumonia.  In the interim cultures turn positive for group B strep.  He has now been narrowed to cefazolin.  He has a childhood allergy of rash to penicillin.  In question the patient and his son today he does have an area of erythema that developed near his right knee along his medial thigh that was apparently more intensely erythematous on admission but has improved.  Other than that he does not have much in the way of focal complaints other than some chronic osteoarthritic pains  I have personally spent 84 minutes involved in face-to-face and non-face-to-face activities for this patient on the day of the visit. Professional time spent includes the following activities: Preparing to see the patient (review of tests), Obtaining and/or reviewing separately obtained history (admission/discharge record), Performing a medically appropriate examination and/or evaluation , Ordering medications/tests/procedures, referring and communicating with other health care professionals, Documenting clinical information in the EMR, Independently interpreting results (not separately reported), Communicating results to the patient/family/caregiver, Counseling and educating the patient/family/caregiver and Care coordination (not separately reported).    Review of Systems: Review of Systems  Constitutional:  Positive for fever. Negative for chills, diaphoresis, malaise/fatigue and weight loss.  HENT:  Negative for congestion, hearing loss, sore throat and tinnitus.   Eyes:  Negative for blurred vision and double vision.  Respiratory:  Negative for cough, sputum production, shortness of breath and wheezing.   Cardiovascular:  Negative for chest pain, palpitations and leg swelling.  Gastrointestinal:  Negative for abdominal pain, blood in stool, constipation, diarrhea, heartburn, melena, nausea and vomiting.  Genitourinary:   Negative for dysuria, flank pain and hematuria.  Musculoskeletal:  Negative for back pain, falls, joint pain and myalgias.  Skin:  Negative for itching and rash.  Neurological:  Positive for dizziness, sensory change and focal weakness. Negative for loss of consciousness, weakness and headaches.  Endo/Heme/Allergies:  Does not bruise/bleed easily.  Psychiatric/Behavioral:  Negative for depression, memory loss and suicidal ideas. The patient is not nervous/anxious.     Past Medical History:  Diagnosis Date   Adenomatous colon polyp 07/2003   Heart valve problem    Dr [name]   Hypertension    Mitral valve disorders(424.0)    Nonrheumatic aortic valve stenosis 01/14/2021   Echo 9/22: EF 60-65, small pericardial effusion, mild AS (mean gradient 16 mmHg), trivial AI, mild MR (MR is eccentric - may be underestimated) // Echo 10/2021: EF 60-65, mod calcification of AV, trivial AI, mod-severe AS (DI 0.25, V-max 350 cm/s, mean 28.8), no RWMA, NL RVSF, severe LAE, mild RAE, small effusion w/o tamponade, ?mild-moderate MR (difficult due to eccentric jet)   Nonrheumatic mitral valve regurgitation 01/14/2021   Echo 10/2021: EF 60-65, moderate calcification of the aortic valve, trivial AI, moderate to severe aortic stenosis (DI 0.25, V-max 350 cm/s, mean gradient 28.8), no RWMA, normal RVSF, severe LAE, mild RAE, small pericardial effusion without tamponade, suspect mild-moderate MR (difficult to qualify degree due to eccentric jet with coanda affect)   Obesity    OSA (obstructive sleep apnea)    cpap   Other and unspecified hyperlipidemia    Permanent atrial fibrillation (HCC)    Zio Monitor 5/22: AFib - Avg HR 55; slowest 32; longest pause 3.9 sec; occ PVCs, 4 beat NSVT   Sleep apnea     Social History   Tobacco Use   Smoking status: Never   Smokeless tobacco: Never  Vaping Use   Vaping status: Never Used  Substance Use Topics   Alcohol use: No    Alcohol/week: 0.0 standard drinks of  alcohol   Drug use: No    Family History  Problem Relation Age of Onset   Alzheimer's disease Mother    Stroke Father    Heart disease Father    Hypertension Father    Alzheimer's disease Maternal Aunt        x 3   Colon cancer Neg Hx    Hypercalcemia Neg Hx    Allergies  Allergen Reactions   Statins Other (See Comments)    "Muscles were messed up"     OBJECTIVE: Blood pressure (!) 140/97, pulse (!) 106, temperature 97.8 F (36.6 C), temperature source Oral, resp. rate 19, height 5\' 10"  (1.778 m), weight 109.9 kg, SpO2 97%.  Physical Exam Constitutional:      Appearance: He is well-developed.  HENT:     Head: Normocephalic and atraumatic.  Eyes:     Conjunctiva/sclera: Conjunctivae normal.  Cardiovascular:     Rate and Rhythm: Normal rate and regular rhythm.     Heart sounds: No murmur heard.    No friction rub. No gallop.  Pulmonary:     Effort: Pulmonary effort is normal. No respiratory distress.     Breath sounds: No stridor. No wheezing or rhonchi.  Abdominal:     General: There is no distension.     Palpations: Abdomen is soft.  Musculoskeletal:  General: No tenderness. Normal range of motion.     Cervical back: Normal range of motion and neck supple.  Skin:    General: Skin is warm and dry.     Coloration: Skin is not pale.     Findings: No erythema or rash.  Neurological:     General: No focal deficit present.     Mental Status: He is alert and oriented to person, place, and time.  Psychiatric:        Mood and Affect: Mood normal.        Behavior: Behavior normal.        Thought Content: Thought content normal.        Judgment: Judgment normal.    Right LE  Erythema involving medial thigh  Lab Results Lab Results  Component Value Date   WBC 14.1 (H) 10/19/2022   HGB 10.7 (L) 10/19/2022   HCT 32.1 (L) 10/19/2022   MCV 89.4 10/19/2022   PLT 239 10/19/2022    Lab Results  Component Value Date   CREATININE 0.99 10/19/2022   BUN 21  10/19/2022   NA 135 10/19/2022   K 3.6 10/19/2022   CL 103 10/19/2022   CO2 23 10/19/2022    Lab Results  Component Value Date   ALT 24 10/17/2022   AST 60 (H) 10/17/2022   ALKPHOS 61 10/17/2022   BILITOT 0.7 10/17/2022     Microbiology: Recent Results (from the past 240 hour(s))  SARS Coronavirus 2 by RT PCR (hospital order, performed in [facility] hospital lab) *cepheid single result test* Anterior Nasal Swab     Status: None   Collection Time: 10/17/22  7:29 PM   Specimen: Anterior Nasal Swab  Result Value Ref Range Status   SARS Coronavirus 2 by RT PCR NEGATIVE NEGATIVE Final    Comment: Performed at [facility], [address] [place], [place] [address]  Culture, blood (Routine X 2) w Reflex to ID Panel     Status: Abnormal (Preliminary result)   Collection Time: 10/17/22  8:15 PM   Specimen: BLOOD  Result Value Ref Range Status   Specimen Description BLOOD LEFT ANTECUBITAL  Final   Special Requests   Final    BOTTLES DRAWN AEROBIC AND ANAEROBIC Blood Culture adequate volume   Culture  Setup Time   Final    GRAM POSITIVE COCCI IN CHAINS AEROBIC BOTTLE ONLY Organism ID to follow CRITICAL RESULT CALLED TO, READ BACK BY AND VERIFIED WITH: S. MOODY PHARMD, AT 1331 10/18/22 D. VANHOOK    Culture (A)  Final    GROUP B STREP(S.AGALACTIAE)ISOLATED SUSCEPTIBILITIES TO FOLLOW Performed at [facility], [address], [place], [place] [address]    Report Status PENDING  Incomplete  Blood Culture ID Panel (Reflexed)     Status: Abnormal   Collection Time: 10/17/22  8:15 PM  Result Value Ref Range Status   Enterococcus faecalis NOT DETECTED NOT DETECTED Final   Enterococcus Faecium NOT DETECTED NOT DETECTED Final   Listeria monocytogenes NOT DETECTED NOT DETECTED Final   Staphylococcus species NOT DETECTED NOT DETECTED Final   Staphylococcus aureus (BCID) NOT DETECTED NOT DETECTED Final   Staphylococcus epidermidis NOT DETECTED NOT DETECTED Final    Staphylococcus lugdunensis NOT DETECTED NOT DETECTED Final   Streptococcus species DETECTED (A) NOT DETECTED Final    Comment: CRITICAL RESULT CALLED TO, READ BACK BY AND VERIFIED WITH: S. MOODY PHARMD, AT 1331 10/18/22 D. VANHOOK    Streptococcus agalactiae DETECTED (A)  NOT DETECTED Final    Comment: CRITICAL RESULT CALLED TO, READ BACK BY AND VERIFIED WITH: S. MOODY PHARMD, AT 1331 10/18/22 D. VANHOOK    Streptococcus pneumoniae NOT DETECTED NOT DETECTED Final   Streptococcus pyogenes NOT DETECTED NOT DETECTED Final   A.calcoaceticus-baumannii NOT DETECTED NOT DETECTED Final   Bacteroides fragilis NOT DETECTED NOT DETECTED Final   Enterobacterales NOT DETECTED NOT DETECTED Final   Enterobacter cloacae complex NOT DETECTED NOT DETECTED Final   Escherichia coli NOT DETECTED NOT DETECTED Final   Klebsiella aerogenes NOT DETECTED NOT DETECTED Final   Klebsiella oxytoca NOT DETECTED NOT DETECTED Final   Klebsiella pneumoniae NOT DETECTED NOT DETECTED Final   Proteus species NOT DETECTED NOT DETECTED Final   Salmonella species NOT DETECTED NOT DETECTED Final   Serratia marcescens NOT DETECTED NOT DETECTED Final   Haemophilus influenzae NOT DETECTED NOT DETECTED Final   Neisseria meningitidis NOT DETECTED NOT DETECTED Final   Pseudomonas aeruginosa NOT DETECTED NOT DETECTED Final   Stenotrophomonas maltophilia NOT DETECTED NOT DETECTED Final   Candida albicans NOT DETECTED NOT DETECTED Final   Candida auris NOT DETECTED NOT DETECTED Final   Candida glabrata NOT DETECTED NOT DETECTED Final   Candida krusei NOT DETECTED NOT DETECTED Final   Candida parapsilosis NOT DETECTED NOT DETECTED Final   Candida tropicalis NOT DETECTED NOT DETECTED Final   Cryptococcus neoformans/gattii NOT DETECTED NOT DETECTED Final    Comment: Performed at [facility], [address] [place], [place] [address]    Acey Lav, MD [facility] [phone] pager  10/19/2022, 2:24 PM

## 2022-10-19 NOTE — Progress Notes (Signed)
Pharmacy Note- Penicillin Allergy Clarification   ASSESSMENT: 37 YOM with unknown childhood penicillin allergy willing to proceed with oral Amoxicillin challenge. Per chart review - appears the patient has been prescribed cephalexin.   PEN-FAST Scoring   Five years or less since last reaction 0  Anaphylaxis/Angioedema OR Severe cutaneous adverse reaction  0  Treatment required for reaction  0  Total Score 0 points - Very low risk of positive penicillin allergy test (<1%)      Type of intervention (select all that apply):  Amoxicillin Oral Challenge  Impact on therapy (select all that apply):  Penicillin allergy removed   PLAN: - amoxicillin 500 mg PO x1  - Q68min vitals monitoring x 1 hour  - Epi-Pen PRN  - IV diphenhydramine PRN  - plan communicated to primary RN   POST-CHALLENGE FOLLOW-UP:  Discussed with primary RN and also with patient - oral Amoxicillin tolerated without issues. Notified patient that I would remove his PCN allergy from the chart.   Thank you for allowing pharmacy to be a part of this patient's care.  Georgina Pillion, PharmD, BCPS, BCIDP Infectious Diseases Clinical Pharmacist 10/19/2022 1:53 PM   **Pharmacist phone directory can now be found on [email or web address] ([email or web address]).  Listed under [facility].

## 2022-10-19 NOTE — Progress Notes (Signed)
TRH night cross cover note:   I was asked to follow-up on this patient's respiratory status after he was noted to have some worsening shortness of breath during the afternoon into the first part of the evening, which prompted rounding hospitalist to order chest x-ray and VBG.  Chest x-ray was suggestive of interstitial edema and in the context of of elevated BNP of greater than 7 fold increase relative to prior, IV fluids were held and the patient received a dose of Lasix 40 mg IV x 1 around 2000. VBG result pending at this time.   Patient with good urine output in response to aforementioned 40 mg of IV Lasix, with urine output noted to greater than 1500 cc's given he received the dose of lasix at 2000 on 8/13. Patient resting comfortably.     Newton Pigg, DO Hospitalist

## 2022-10-19 NOTE — Progress Notes (Signed)
PT Cancellation Note  Patient Details Name: [name] MRN: [id number] DOB: 12/16/1950   Cancelled Treatment:    Reason Eval/Treat Not Completed: (P) Fatigue/lethargy limiting ability to participate, pt reporting increased fatigue and "hard day" and requesting this PTA return tomorrow. Will check back as schedule allows to continue with PT POC.  Lenora Boys. PTA Acute Rehabilitation Services Office: [phone]    Catalina Antigua 10/19/2022, 3:50 PM

## 2022-10-19 NOTE — Progress Notes (Addendum)
ANTICOAGULATION CONSULT NOTE Pharmacy Consult for Warfarin  Indication: atrial fibrillation  Allergies  Allergen Reactions   Statins Other (See Comments)    "Muscles were messed up"    Penicillins Other (See Comments)    Childhood reaction. Unknown per Pt, cannot remember ever trying beta lactams     Vital Signs: Temp: 98.2 F (36.8 C) (08/14 0800) Temp Source: Oral (08/14 0400) BP: 171/100 (08/14 0800) Pulse Rate: 98 (08/14 0800)  Labs: Recent Labs    10/17/22 1929 10/17/22 1946 10/18/22 0242 10/18/22 0617 10/19/22 0734  HGB 11.9* 11.6* 10.3*  --  10.7*  HCT 35.7* 34.0* 30.8*  --  32.1*  PLT 257  --  212  --  239  LABPROT 38.1*  --   --  33.8* 42.2*  INR 3.8*  --   --  3.3* 4.4*  CREATININE 1.09 1.10 0.92  --  0.99    Estimated Creatinine Clearance: 83.8 mL/min (by C-G formula based on SCr of 0.99 mg/dL).   Medical History: Past Medical History:  Diagnosis Date   Adenomatous colon polyp 07/2003   Heart valve problem    Dr [name]   Hypertension    Mitral valve disorders(424.0)    Nonrheumatic aortic valve stenosis 01/14/2021   Echo 9/22: EF 60-65, small pericardial effusion, mild AS (mean gradient 16 mmHg), trivial AI, mild MR (MR is eccentric - may be underestimated) // Echo 10/2021: EF 60-65, mod calcification of AV, trivial AI, mod-severe AS (DI 0.25, V-max 350 cm/s, mean 28.8), no RWMA, NL RVSF, severe LAE, mild RAE, small effusion w/o tamponade, ?mild-moderate MR (difficult due to eccentric jet)   Nonrheumatic mitral valve regurgitation 01/14/2021   Echo 10/2021: EF 60-65, moderate calcification of the aortic valve, trivial AI, moderate to severe aortic stenosis (DI 0.25, V-max 350 cm/s, mean gradient 28.8), no RWMA, normal RVSF, severe LAE, mild RAE, small pericardial effusion without tamponade, suspect mild-moderate MR (difficult to qualify degree due to eccentric jet with coanda affect)   Obesity    OSA (obstructive sleep apnea)    cpap   Other and  unspecified hyperlipidemia    Permanent atrial fibrillation (HCC)    Zio Monitor 5/22: AFib - Avg HR 55; slowest 32; longest pause 3.9 sec; occ PVCs, 4 beat NSVT   Sleep apnea     Assessment: 72 y/o M present to the ED with generalized weakness. On warfarin PTA for afib. INR is supra-therapeutic at 3.8 on admission 8/12 PM, trended down close to therapeutic at 3.3 8/13 and warfarin dose was given (missed dose 8/12 PTA). INR trended back up to 4.4 today. Noted DDI with azithromycin received x 1 dose on 8/12 - likely contributing to INR increase. CBC stable. No bleed issues reported.   PTA dose - 5mg  daily except 7.5mg  qMWF per last outpatient anticoagulation clinic note 7/31 and med rec, INR 2.2 (last dose pta 8/11)  Goal of Therapy:  INR 2-3 Monitor platelets by anticoagulation protocol: Yes   Plan:  Hold warfarin with supratherapeutic INR Monitor daily INR, CBC, s/sx bleeding   Leia Alf, PharmD, BCPS Please check AMION for all [facility] contact numbers Clinical Pharmacist 10/19/2022 8:39 AM

## 2022-10-20 DIAGNOSIS — I77819 Aortic ectasia, unspecified site: Secondary | ICD-10-CM

## 2022-10-20 DIAGNOSIS — N179 Acute kidney failure, unspecified: Secondary | ICD-10-CM | POA: Diagnosis not present

## 2022-10-20 DIAGNOSIS — A401 Sepsis due to streptococcus, group B: Principal | ICD-10-CM

## 2022-10-20 DIAGNOSIS — I2510 Atherosclerotic heart disease of native coronary artery without angina pectoris: Secondary | ICD-10-CM | POA: Diagnosis not present

## 2022-10-20 DIAGNOSIS — I350 Nonrheumatic aortic (valve) stenosis: Secondary | ICD-10-CM | POA: Diagnosis not present

## 2022-10-20 DIAGNOSIS — R6520 Severe sepsis without septic shock: Secondary | ICD-10-CM | POA: Diagnosis not present

## 2022-10-20 DIAGNOSIS — J189 Pneumonia, unspecified organism: Secondary | ICD-10-CM | POA: Diagnosis not present

## 2022-10-20 LAB — PROTIME-INR
INR: 3.4 — ABNORMAL HIGH (ref 0.8–1.2)
Prothrombin Time: 34.7 s — ABNORMAL HIGH (ref 11.4–15.2)

## 2022-10-20 LAB — CBC
HCT: 31.1 % — ABNORMAL LOW (ref 39.0–52.0)
Hemoglobin: 10.5 g/dL — ABNORMAL LOW (ref 13.0–17.0)
MCH: 30.6 pg (ref 26.0–34.0)
MCHC: 33.8 g/dL (ref 30.0–36.0)
MCV: 90.7 fL (ref 80.0–100.0)
Platelets: 256 10*3/uL (ref 150–400)
RBC: 3.43 MIL/uL — ABNORMAL LOW (ref 4.22–5.81)
RDW: 14.4 % (ref 11.5–15.5)
WBC: 13.3 10*3/uL — ABNORMAL HIGH (ref 4.0–10.5)
nRBC: 0 % (ref 0.0–0.2)

## 2022-10-20 LAB — BASIC METABOLIC PANEL
Anion gap: 8 (ref 5–15)
BUN: 26 mg/dL — ABNORMAL HIGH (ref 8–23)
CO2: 20 mmol/L — ABNORMAL LOW (ref 22–32)
Calcium: 10.1 mg/dL (ref 8.9–10.3)
Chloride: 104 mmol/L (ref 98–111)
Creatinine, Ser: 0.99 mg/dL (ref 0.61–1.24)
GFR, Estimated: >60 mL/min (ref >60)
Glucose, Bld: 140 mg/dL — ABNORMAL HIGH (ref 70–99)
Potassium: 3.5 mmol/L (ref 3.5–5.1)
Sodium: 132 mmol/L — ABNORMAL LOW (ref 135–145)

## 2022-10-20 NOTE — Progress Notes (Signed)
[facility]  Date of Admission:  10/17/2022      Total days of antibiotics 3   Cefazolin        ASSESSMENT: [name] is a 72 y.o. male admitted with weakness, shakes an chills w/o any other localizing symptoms. Found to have group b streptococcus bacteremia with hospital work up from undetermined source.   GBS Bacteremia - unexplained -clinically improving, though still quite weak and difficult to work with PT consistently.  -Largely his leg does not seem to be a solid source of explanation here as it does not look very inflamed/cellulitic. -We spent time discussing in detail how TEE would change course (mode and duration) of treatment recommendations, even if he would not entertain possibility of surgery. He agreed to proceed with TEE - will message cards team for consult. D/W primary as well.  -hold on picc line - if TEE clean will do shorter course of oral.  -continue cefazolin for now  Back Pain, chronic -  -largest concern for procedure of TEE would be discomfort a/w laying flat. I am hopeful that anesthesia team can help time sedation to help with this request.    H/O Reported Amoxicillin Childhood Allergy -  -Tolerated amox challenge w/o trouble.  -Allergy de-labled.   Venous Stasis Dermatitis -  -legs don't appear acutely infected.      PLAN: Continue cefazolin  Consult cards for TEE (done - scheduled 8/20)  Will follow up with him next week after TEE results are available. He is fine with staying longer to help with strength and rehab given ongoing weakness.    Principal Problem:   Sepsis due to Streptococcus, group B (HCC) Active Problems:   Dyslipidemia   OBSTRUCTIVE SLEEP APNEA   ATRIAL FIBRILLATION   Nonrheumatic aortic valve stenosis   CAP (community acquired pneumonia)   HTN (hypertension)   CAD (coronary artery disease)   Pneumonia    carvedilol  12.5 mg Oral BID WC   cloNIDine  0.2 mg Oral BID    doxazosin  4 mg Oral Daily   hydrochlorothiazide  12.5 mg Oral Daily   losartan  100 mg Oral Daily   lubiprostone  24 mcg Oral BID   omega-3 acid ethyl esters  1 g Oral Daily   Warfarin - Pharmacist Dosing Inpatient   Does not apply q1600    SUBJECTIVE: Feeling better, still very weak and difficult to participate in PT. His nephew joins [place] at the bedside today. He has several good questions about the value and risk for TEE. His main concern is the back discomfort he will feel when lowered down flat for the procedure. He would never entertain cardiac surgery if that were something to be realized.    Review of Systems: Review of Systems  Constitutional:  Negative for chills and fever.  Cardiovascular: Negative.   Genitourinary: Negative.   Musculoskeletal:  Positive for back pain (chronic and stable). Negative for joint pain.    Allergies  Allergen Reactions   Statins Other (See Comments)    "Muscles were messed up"     OBJECTIVE: Vitals:   10/19/22 1609 10/19/22 1931 10/20/22 0212 10/20/22 0807  BP: 127/78 (!) 145/97 (!) 161/105 (!) 180/98  Pulse: 93 (!) 102 97 96  Resp: 19 17 17 18   Temp:  98.1 F (36.7 C) 99.2 F (37.3 C) 97.9 F (36.6 C)  TempSrc: Oral Oral Oral Oral  SpO2: 97% 95% 97%  96%  Weight:      Height:       Body mass index is 34.76 kg/m.  Physical Exam Vitals reviewed.  Constitutional:      Appearance: Normal appearance. He is not ill-appearing.  Skin:    General: Skin is warm and dry.     Comments: Venous stasis dermatitis on b/l lower extremities. Left anterior shin with some excoriations from fall at home.  Slight pink appearance of upper inner thigh w/o any tenderness.   Neurological:     Mental Status: He is alert and oriented to person, place, and time.     Lab Results Lab Results  Component Value Date   WBC 13.3 (H) 10/20/2022   HGB 10.5 (L) 10/20/2022   HCT 31.1 (L) 10/20/2022   MCV 90.7 10/20/2022   PLT 256 10/20/2022    Lab  Results  Component Value Date   CREATININE 0.99 10/20/2022   BUN 26 (H) 10/20/2022   NA 132 (L) 10/20/2022   K 3.5 10/20/2022   CL 104 10/20/2022   CO2 20 (L) 10/20/2022    Lab Results  Component Value Date   ALT 24 10/17/2022   AST 60 (H) 10/17/2022   ALKPHOS 61 10/17/2022   BILITOT 0.7 10/17/2022     Microbiology: Recent Results (from the past 240 hour(s))  SARS Coronavirus 2 by RT PCR (hospital order, performed in [facility] hospital lab) *cepheid single result test* Anterior Nasal Swab     Status: None   Collection Time: 10/17/22  7:29 PM   Specimen: Anterior Nasal Swab  Result Value Ref Range Status   SARS Coronavirus 2 by RT PCR NEGATIVE NEGATIVE Final    Comment: Performed at [facility], [address] [place], [place] [address]  Culture, blood (Routine X 2) w Reflex to ID Panel     Status: Abnormal   Collection Time: 10/17/22  8:15 PM   Specimen: BLOOD  Result Value Ref Range Status   Specimen Description BLOOD LEFT ANTECUBITAL  Final   Special Requests   Final    BOTTLES DRAWN AEROBIC AND ANAEROBIC Blood Culture adequate volume   Culture  Setup Time   Final    GRAM POSITIVE COCCI IN CHAINS AEROBIC BOTTLE ONLY Organism ID to follow CRITICAL RESULT CALLED TO, READ BACK BY AND VERIFIED WITH: Joellen Jersey PHARMD, AT 1331 10/18/22 Renato Shin Performed at [facility], [address], [place], [place] [address]    Culture GROUP B STREP(S.AGALACTIAE)ISOLATED (A)  Final   Report Status 10/20/2022 FINAL  Final   Organism ID, Bacteria GROUP B STREP(S.AGALACTIAE)ISOLATED  Final      Susceptibility   Group b strep(s.agalactiae)isolated - MIC*    CLINDAMYCIN RESISTANT Resistant     AMPICILLIN <=0.25 SENSITIVE Sensitive     ERYTHROMYCIN 2 RESISTANT Resistant     VANCOMYCIN 0.5 SENSITIVE Sensitive     CEFTRIAXONE <=0.12 SENSITIVE Sensitive     LEVOFLOXACIN 1 SENSITIVE Sensitive     PENICILLIN <=0.06 SENSITIVE Sensitive     * GROUP B STREP(S.AGALACTIAE)ISOLATED   Blood Culture ID Panel (Reflexed)     Status: Abnormal   Collection Time: 10/17/22  8:15 PM  Result Value Ref Range Status   Enterococcus faecalis NOT DETECTED NOT DETECTED Final   Enterococcus Faecium NOT DETECTED NOT DETECTED Final   Listeria monocytogenes NOT DETECTED NOT DETECTED Final   Staphylococcus species NOT DETECTED NOT DETECTED Final   Staphylococcus aureus (BCID) NOT DETECTED NOT DETECTED Final  Staphylococcus epidermidis NOT DETECTED NOT DETECTED Final   Staphylococcus lugdunensis NOT DETECTED NOT DETECTED Final   Streptococcus species DETECTED (A) NOT DETECTED Final    Comment: CRITICAL RESULT CALLED TO, READ BACK BY AND VERIFIED WITH: S. MOODY PHARMD, AT 1331 10/18/22 D. VANHOOK    Streptococcus agalactiae DETECTED (A) NOT DETECTED Final    Comment: CRITICAL RESULT CALLED TO, READ BACK BY AND VERIFIED WITH: S. MOODY PHARMD, AT 1331 10/18/22 D. VANHOOK    Streptococcus pneumoniae NOT DETECTED NOT DETECTED Final   Streptococcus pyogenes NOT DETECTED NOT DETECTED Final   A.calcoaceticus-baumannii NOT DETECTED NOT DETECTED Final   Bacteroides fragilis NOT DETECTED NOT DETECTED Final   Enterobacterales NOT DETECTED NOT DETECTED Final   Enterobacter cloacae complex NOT DETECTED NOT DETECTED Final   Escherichia coli NOT DETECTED NOT DETECTED Final   Klebsiella aerogenes NOT DETECTED NOT DETECTED Final   Klebsiella oxytoca NOT DETECTED NOT DETECTED Final   Klebsiella pneumoniae NOT DETECTED NOT DETECTED Final   Proteus species NOT DETECTED NOT DETECTED Final   Salmonella species NOT DETECTED NOT DETECTED Final   Serratia marcescens NOT DETECTED NOT DETECTED Final   Haemophilus influenzae NOT DETECTED NOT DETECTED Final   Neisseria meningitidis NOT DETECTED NOT DETECTED Final   Pseudomonas aeruginosa NOT DETECTED NOT DETECTED Final   Stenotrophomonas maltophilia NOT DETECTED NOT DETECTED Final   Candida albicans NOT DETECTED NOT DETECTED Final   Candida auris NOT DETECTED  NOT DETECTED Final   Candida glabrata NOT DETECTED NOT DETECTED Final   Candida krusei NOT DETECTED NOT DETECTED Final   Candida parapsilosis NOT DETECTED NOT DETECTED Final   Candida tropicalis NOT DETECTED NOT DETECTED Final   Cryptococcus neoformans/gattii NOT DETECTED NOT DETECTED Final    Comment: Performed at [facility], [address], [place], [place] [address]     Rexene Alberts, MSN, NP-C [facility]  [email or web address] Pager: [phone] Office: [phone] [facility] Main Line: [phone] *Secure Chat Communication Welcome

## 2022-10-20 NOTE — Progress Notes (Signed)
Physical Therapy Treatment Patient Details Name: [name] MRN: [id number] DOB: 10/24/1950 Today's Date: 10/20/2022   History of Present Illness Pt is a 72 y.o. male admitted 10/17/22 with a "viral type illness" resulting in weakness, fatigue and fall calling EMS. Workup for CAP. PMH includes HTN, moderate AS, afib, HLD, OSA, morbid obesity.    PT Comments  Pt greeted up in chair on arrival and agreeable to session with slow progress towards acute goals. Pt requiring up to min A to come to stand x3 throughout session to backwards RW (per pr preference) and min A to progress gait a few steps each trial x2 trials. Pt with slow labored steps with standing rest needed between each step and forward flexed posture due to fatigue. Pt able to stand pivot chair>EOB with CGA at end of session per pt request due to fatigue. Pt continues to be limited by decreased activity tolerance, weakness, impaired balance/postural reactions and poor cardiopulmonary endurance. Current plan remains appropriate and pt continues to benefit from skilled PT services to progress toward functional mobility goals.      If plan is discharge home, recommend the following: A little help with walking and/or transfers;A little help with bathing/dressing/bathroom;Assistance with cooking/housework;Assist for transportation;Help with stairs or ramp for entrance   Can travel by private vehicle        Equipment Recommendations  None recommended by PT    Recommendations for Other Services       Precautions / Restrictions Precautions Precautions: Fall Restrictions Weight Bearing Restrictions: No     Mobility  Bed Mobility Overal bed mobility: Needs Assistance Bed Mobility: Sit to Supine       Sit to supine: Mod assist   General bed mobility comments: mod A for LE management    Transfers Overall transfer level: Needs assistance Equipment used: Rolling walker (2 wheels) Transfers: Sit to/from Stand, Bed to  chair/wheelchair/BSC Sit to Stand: Contact guard assist, Min assist Stand pivot transfers: Contact guard assist         General transfer comment: RW backwards initially for pt to pull up from, then turned around for transfer. This is how pt performs transfers at home and reports he will do it this way at home regardless of any education provided, so allowing and providing feedback to optimize safety. Contstant Min A lifting assist to rise from chair and steady RW as pt reporting RW sliding, CGA to stand pivot chair>EOB at end of session    Ambulation/Gait Ambulation/Gait assistance: Contact guard assist Gait Distance (Feet): 2 Feet Assistive device: Rolling walker (2 wheels) Gait Pattern/deviations: Step-to pattern, Trunk flexed, Antalgic Gait velocity: decr     General Gait Details: pt able to take a few very labored steps forward with heavy RW support and trunk flexed with pt resting L forearm on RW at one point, attempted x2 bouts but pt with max fatigue after each stand and only able to progress a few steps each attempt   Stairs             Wheelchair Mobility     Tilt Bed    Modified Rankin (Stroke Patients Only)       Balance Overall balance assessment: Needs assistance Sitting-balance support: No upper extremity supported, Feet supported, Feet unsupported Sitting balance-Leahy Scale: Fair Sitting balance - Comments: assist to don/doff shoes sitting EOB. Nephew iondep assisting   Standing balance support: During functional activity, Single extremity supported, Bilateral upper extremity supported, Reliant on assistive device for balance Standing balance-Leahy  Scale: Poor Standing balance comment: reliant on external supporrt                            Cognition Arousal: Alert Behavior During Therapy: WFL for tasks assessed/performed Overall Cognitive Status: Within Functional Limits for tasks assessed                                           Exercises General Exercises - Lower Extremity Long Arc Quad: AROM, Right, Left, 10 reps, Seated (with 2 second hold at end range) Hip ABduction/ADduction: AROM, Left, Right, 10 reps, Seated    General Comments General comments (skin integrity, edema, etc.): increased WOB during mobility with SpO2 >90% throughout, replace supplemental O2 at end of session for comfort      Pertinent Vitals/Pain Pain Assessment Pain Assessment: Faces Faces Pain Scale: Hurts even more Pain Location: back Pain Descriptors / Indicators: Discomfort, Grimacing Pain Intervention(s): Monitored during session, Limited activity within patient's tolerance    Home Living                          Prior Function            PT Goals (current goals can now be found in the care plan section) Acute Rehab PT Goals PT Goal Formulation: With patient Time For Goal Achievement: 11/01/22 Progress towards PT goals: Progressing toward goals    Frequency    Min 1X/week      PT Plan      Co-evaluation              AM-PAC PT "6 Clicks" Mobility   Outcome Measure  Help needed turning from your back to your side while in a flat bed without using bedrails?: A Little Help needed moving from lying on your back to sitting on the side of a flat bed without using bedrails?: A Little Help needed moving to and from a bed to a chair (including a wheelchair)?: A Little Help needed standing up from a chair using your arms (e.g., wheelchair or bedside chair)?: A Little Help needed to walk in hospital room?: A Little Help needed climbing 3-5 steps with a railing? : A Lot 6 Click Score: 17    End of Session Equipment Utilized During Treatment: Gait belt Activity Tolerance: Patient tolerated treatment well;Patient limited by fatigue Patient left: in bed;with call bell/phone within reach;with family/visitor present Nurse Communication: Mobility status PT Visit Diagnosis: Other abnormalities of  gait and mobility (R26.89);Muscle weakness (generalized) (M62.81)     Time: 4696-2952 PT Time Calculation (min) (ACUTE ONLY): 35 min  Charges:    $Therapeutic Activity: 23-37 mins PT General Charges $$ ACUTE PT VISIT: 1 Visit                     Kaitlan Bin R. PTA Acute Rehabilitation Services Office: [phone]   Catalina Antigua 10/20/2022, 2:30 PM

## 2022-10-20 NOTE — Progress Notes (Signed)
PROGRESS NOTE [name]  UJW:[id number] DOB: 1950-11-11 DOA: 10/17/2022 PCP: [name], MD  Brief Narrative/Hospital Course: 72 yom w/ HTN, moderate aortic stenosis, mitral valve prolapse, atrial fibrillation, hyperlipidemia, OSA not on CPAP, HTN, morbid obesity who erarlier this week developed what he calls a viral type illness w/ shakes and chills which resolved but has become weaker and weaker since and eventually fell,did not hit his head and there was no loss of consciousness.  911 was called and brought to the ED. In the ED: Tmax 100.6.,WBC 22.5, INR 3.8. Chest x-ray cardiomegaly with pulmonary vascular congestion, possibly edema versus infiltrate.  Lactic acid 5.0.  Doing well on room air, blood culture obtained Rocephin azithromycin given, UA unremarkable.  Initial troponin.  Bolus normal saline. Patient was admitted for further management Repeat lactic acid downtrending 3.4, Pro-Cal is up to 2.3 WBC downtrending 20.6. 8/13: Blood culture came back positive for Streptococcus    Subjective: Seen and examined On 2l [place] Nephew at bedside Overnight patient has been afebrile blood pressure 120s to 160s systolic> trending up this am 56% on 2 L of cannula Labs shows WBC downtrending 13.3, INR 3.4 stable renal function  Assessment and Plan: Principal Problem:   Sepsis due to Streptococcus, group B (HCC) Active Problems:   Dyslipidemia   OBSTRUCTIVE SLEEP APNEA   ATRIAL FIBRILLATION   Nonrheumatic aortic valve stenosis   CAP (community acquired pneumonia)   HTN (hypertension)   CAD (coronary artery disease)   Pneumonia  Severe sepsis POA w/ GBS bacteremia  Possible community-acquired pneumonia ? Cutaneous source due to cellulitis right lower leg Valvular heart disease and concern for possible native valve endocarditis given bacteremia: admitted SIRS criteria in the ED with fever 100.6,HR 92,112, leukocytosis 22k,lactic acidosis 5.0, procal 2.5 >received 2.5 L bolus, blood  culture + 1/2 for GBS.hemodynamically stable WBC downtrending, no temp Chest x-ray with interstitial prominence bilaterally possible edema or infiltrate. Strep pneumonia antigen negative, Legionella antigen pending Appreciate ID inputs TTE without endocarditis may need to consider TEE-but patient is not too keen on these- states he is looking towards living some more months/years. Continue current antibiotics Ancef, appreciate ID input and follow-up further plan.    Acute respiratory failure with hypoxia Shortness of breath Elevated BNP Moderate AS/MR BNP 465 but no signs of fluid overload last echo was 12/15/28.  Patient need IV fluids and admissions subsequently shortness of breath improved with Lasix.  Echo obtained showed EF 60-65% no RWMA, small pericardial effusion, mitral valve is normal aortic valve is normal mild dilatation of aortic root and ascending aorta no interatrial shunt. Continue supplemental oxygen. Wean o2 as tolerated, OOB  Hypokalemia: Resolved   Anemia normocytic: appears chronic,stable, monitor hb Recent Labs  Lab 10/17/22 1929 10/17/22 1946 10/18/22 0242 10/19/22 0734 10/20/22 0157  HGB 11.9* 11.6* 10.3* 10.7* 10.5*  HCT 35.7* 34.0* 30.8* 32.1* 31.1*    CAD HLD Hypertension: No chest pain BP poorly controlled but overall improving, continue home Coreg clonidine Cardura losartan and hydrochlorothiazide.  Hyponatremia Mild metabolic acidosis: Encourage oral intake  Chronic atrial fibrillation: Rate controlled, anticoagulated on Coumadin INR supratherapeutic, being managed per pharmacy.  Continue home Coreg.   Recent Labs  Lab 10/17/22 1929 10/18/22 0617 10/19/22 0734 10/20/22 0157  INR 3.8* 3.3* 4.4* 3.4*    Chronic constipation: continue laxatives as needed, cont lubipriustone.last bm 8/12  OSA: Does not use CPAP at home.  Likely contributing to his hypoxia  Deconditioning/Debility: PTOT eval to continue> requested evaluation wonders  if he  needs a skilled nursing facility but if he does well can consider home health.  Class I Obesity:Patient's Body mass index is 34.76 kg/m. : Will benefit with PCP follow-up, weight loss  healthy lifestyle and outpatient sleep evaluation.  DVT prophylaxis: coumadin  Code Status:   Code Status: DNR Family Communication: plan of care discussed with patient and his Nephew at bedside. Patient status is:  inpatient  because of pneumonia Level of care: Telemetry Medical   Dispo: The patient is from: home alone, nephew close by            Anticipated disposition: TBD pending clinical improvement   Objective: Vitals last 24 hrs: Vitals:   10/19/22 1609 10/19/22 1931 10/20/22 0212 10/20/22 0807  BP: 127/78 (!) 145/97 (!) 161/105 (!) 180/98  Pulse: 93 (!) 102 97 96  Resp: 19 17 17 18   Temp:  98.1 F (36.7 C) 99.2 F (37.3 C) 97.9 F (36.6 C)  TempSrc: Oral Oral Oral Oral  SpO2: 97% 95% 97% 96%  Weight:      Height:       Weight change:   Physical Examination: General exam: alert awake, oriented x 3 HEENT:Oral mucosa moist, Ear/Nose WNL grossly Respiratory system: Bilaterally clear BS,no use of accessory muscle Cardiovascular system: S1 & S2 +, No JVD. Gastrointestinal system: Abdomen soft,NT,ND, BS+ Nervous System: Alert, awake, moving all extremities,and following commands. Extremities: LE edema neg,distal peripheral pulses palpable and warm.  Skin: No rashes,no icterus. MSK: Normal muscle bulk,tone, power   Medications reviewed:  Scheduled Meds:  carvedilol  12.5 mg Oral BID WC   cloNIDine  0.2 mg Oral BID   doxazosin  4 mg Oral Daily   hydrochlorothiazide  12.5 mg Oral Daily   losartan  100 mg Oral Daily   lubiprostone  24 mcg Oral BID   omega-3 acid ethyl esters  1 g Oral Daily   Warfarin - Pharmacist Dosing Inpatient   Does not apply q1600   Continuous Infusions:   ceFAZolin (ANCEF) IV 2 g (10/20/22 0526)      Diet Order             Diet Heart Room service  appropriate? Yes; Fluid consistency: Thin  Diet effective now                  Intake/Output Summary (Last 24 hours) at 10/20/2022 1130 Last data filed at 10/20/2022 0440 Gross per 24 hour  Intake 118 ml  Output 1250 ml  Net -1132 ml   Net IO Since Admission: -787 mL [10/20/22 1130]  Wt Readings from Last 3 Encounters:  10/18/22 109.9 kg  09/09/22 108.4 kg  08/31/22 106.5 kg     Unresulted Labs (From admission, onward)     Start     Ordered   10/20/22 0954  Culture, blood (Routine X 2) w Reflex to ID Panel  BLOOD CULTURE X 2,   R (with TIMED occurrences)      10/20/22 0953   10/19/22 0500  Basic metabolic panel  Daily,   R      10/18/22 0749   10/19/22 0500  CBC  Daily,   R      10/18/22 0749   10/18/22 0500  Protime-INR  Daily,   R      10/18/22 0242          Data Reviewed: I have personally reviewed following labs and imaging studies CBC: Recent Labs  Lab 10/17/22 1929 10/17/22 1946 10/18/22 0242  10/19/22 0734 10/20/22 0157  WBC 22.5*  --  20.6* 14.1* 13.3*  NEUTROABS 21.0*  --  19.0*  --   --   HGB 11.9* 11.6* 10.3* 10.7* 10.5*  HCT 35.7* 34.0* 30.8* 32.1* 31.1*  MCV 92.7  --  90.1 89.4 90.7  PLT 257  --  212 239 256   Basic Metabolic Panel: Recent Labs  Lab 10/17/22 1929 10/17/22 1946 10/18/22 0242 10/19/22 0734 10/20/22 0157  NA 133* 135 132* 135 132*  K 3.4* 3.6 3.2* 3.6 3.5  CL 100 105 101 103 104  CO2 24  --  21* 23 20*  GLUCOSE 124* 120* 103* 106* 140*  BUN 24* 25* 20 21 26*  CREATININE 1.09 1.10 0.92 0.99 0.99  CALCIUM 10.7*  --  9.6 10.1 10.1   GFR: Estimated Creatinine Clearance: 83.8 mL/min (by C-G formula based on SCr of 0.99 mg/dL). Liver Function Tests: Recent Labs  Lab 10/17/22 1929  AST 60*  ALT 24  ALKPHOS 61  BILITOT 0.7  PROT 6.5  ALBUMIN 2.9*   Recent Labs  Lab 10/17/22 1929 10/18/22 0617 10/19/22 0734 10/20/22 0157  INR 3.8* 3.3* 4.4* 3.4*   Recent Labs  Lab 10/17/22 2059 10/18/22 0242 10/18/22 0246  10/18/22 0724 10/18/22 0924  PROCALCITON  --  2.35  --   --   --   LATICACIDVEN 5.0*  --  3.4* 1.3 1.0   Recent Results (from the past 240 hour(s))  SARS Coronavirus 2 by RT PCR (hospital order, performed in [facility] hospital lab) *cepheid single result test* Anterior Nasal Swab     Status: None   Collection Time: 10/17/22  7:29 PM   Specimen: Anterior Nasal Swab  Result Value Ref Range Status   SARS Coronavirus 2 by RT PCR NEGATIVE NEGATIVE Final    Comment: Performed at [facility], [address] [place], [place] [address]  Culture, blood (Routine X 2) w Reflex to ID Panel     Status: Abnormal   Collection Time: 10/17/22  8:15 PM   Specimen: BLOOD  Result Value Ref Range Status   Specimen Description BLOOD LEFT ANTECUBITAL  Final   Special Requests   Final    BOTTLES DRAWN AEROBIC AND ANAEROBIC Blood Culture adequate volume   Culture  Setup Time   Final    GRAM POSITIVE COCCI IN CHAINS AEROBIC BOTTLE ONLY Organism ID to follow CRITICAL RESULT CALLED TO, READ BACK BY AND VERIFIED WITH: Joellen Jersey PHARMD, AT 1331 10/18/22 Renato Shin Performed at [facility], [address], [place], [place] [address]    Culture GROUP B STREP(S.AGALACTIAE)ISOLATED (A)  Final   Report Status 10/20/2022 FINAL  Final   Organism ID, Bacteria GROUP B STREP(S.AGALACTIAE)ISOLATED  Final      Susceptibility   Group b strep(s.agalactiae)isolated - MIC*    CLINDAMYCIN RESISTANT Resistant     AMPICILLIN <=0.25 SENSITIVE Sensitive     ERYTHROMYCIN 2 RESISTANT Resistant     VANCOMYCIN 0.5 SENSITIVE Sensitive     CEFTRIAXONE <=0.12 SENSITIVE Sensitive     LEVOFLOXACIN 1 SENSITIVE Sensitive     PENICILLIN <=0.06 SENSITIVE Sensitive     * GROUP B STREP(S.AGALACTIAE)ISOLATED  Blood Culture ID Panel (Reflexed)     Status: Abnormal   Collection Time: 10/17/22  8:15 PM  Result Value Ref Range Status   Enterococcus faecalis NOT DETECTED NOT DETECTED Final   Enterococcus Faecium NOT DETECTED NOT  DETECTED Final   Listeria monocytogenes NOT DETECTED NOT  DETECTED Final   Staphylococcus species NOT DETECTED NOT DETECTED Final   Staphylococcus aureus (BCID) NOT DETECTED NOT DETECTED Final   Staphylococcus epidermidis NOT DETECTED NOT DETECTED Final   Staphylococcus lugdunensis NOT DETECTED NOT DETECTED Final   Streptococcus species DETECTED (A) NOT DETECTED Final    Comment: CRITICAL RESULT CALLED TO, READ BACK BY AND VERIFIED WITH: S. MOODY PHARMD, AT 1331 10/18/22 D. VANHOOK    Streptococcus agalactiae DETECTED (A) NOT DETECTED Final    Comment: CRITICAL RESULT CALLED TO, READ BACK BY AND VERIFIED WITH: S. MOODY PHARMD, AT 1331 10/18/22 D. VANHOOK    Streptococcus pneumoniae NOT DETECTED NOT DETECTED Final   Streptococcus pyogenes NOT DETECTED NOT DETECTED Final   A.calcoaceticus-baumannii NOT DETECTED NOT DETECTED Final   Bacteroides fragilis NOT DETECTED NOT DETECTED Final   Enterobacterales NOT DETECTED NOT DETECTED Final   Enterobacter cloacae complex NOT DETECTED NOT DETECTED Final   Escherichia coli NOT DETECTED NOT DETECTED Final   Klebsiella aerogenes NOT DETECTED NOT DETECTED Final   Klebsiella oxytoca NOT DETECTED NOT DETECTED Final   Klebsiella pneumoniae NOT DETECTED NOT DETECTED Final   Proteus species NOT DETECTED NOT DETECTED Final   Salmonella species NOT DETECTED NOT DETECTED Final   Serratia marcescens NOT DETECTED NOT DETECTED Final   Haemophilus influenzae NOT DETECTED NOT DETECTED Final   Neisseria meningitidis NOT DETECTED NOT DETECTED Final   Pseudomonas aeruginosa NOT DETECTED NOT DETECTED Final   Stenotrophomonas maltophilia NOT DETECTED NOT DETECTED Final   Candida albicans NOT DETECTED NOT DETECTED Final   Candida auris NOT DETECTED NOT DETECTED Final   Candida glabrata NOT DETECTED NOT DETECTED Final   Candida krusei NOT DETECTED NOT DETECTED Final   Candida parapsilosis NOT DETECTED NOT DETECTED Final   Candida tropicalis NOT DETECTED NOT DETECTED  Final   Cryptococcus neoformans/gattii NOT DETECTED NOT DETECTED Final    Comment: Performed at [facility], [address], [place], [place] [address]    Antimicrobials: Anti-infectives (From admission, onward)    Start     Dose/Rate Route Frequency Ordered Stop   10/19/22 1400  ceFAZolin (ANCEF) IVPB 2g/100 mL premix        2 g 200 mL/hr over 30 Minutes Intravenous Every 8 hours 10/18/22 1441     10/19/22 1100  amoxicillin (AMOXIL) capsule 500 mg        500 mg Oral  Once 10/19/22 1014 10/19/22 1225   10/18/22 1400  cefTRIAXone (ROCEPHIN) 2 g in sodium chloride 0.9 % 100 mL IVPB  Status:  Discontinued        2 g 200 mL/hr over 30 Minutes Intravenous Every 24 hours 10/18/22 1054 10/18/22 1441   10/17/22 2045  azithromycin (ZITHROMAX) 500 mg in sodium chloride 0.9 % 250 mL IVPB  Status:  Discontinued        500 mg 250 mL/hr over 60 Minutes Intravenous Every 24 hours 10/17/22 2039 10/18/22 1441   10/17/22 2045  cefTRIAXone (ROCEPHIN) 1 g in sodium chloride 0.9 % 100 mL IVPB  Status:  Discontinued        1 g 200 mL/hr over 30 Minutes Intravenous Every 24 hours 10/17/22 2039 10/18/22 1054      Culture/Microbiology    Component Value Date/Time   SDES BLOOD LEFT ANTECUBITAL 10/17/2022 2015   SPECREQUEST  10/17/2022 2015    BOTTLES DRAWN AEROBIC AND ANAEROBIC Blood Culture adequate volume   CULT GROUP B STREP(S.AGALACTIAE)ISOLATED (A) 10/17/2022 2015   REPTSTATUS 10/20/2022 FINAL 10/17/2022 2015  Radiology Studies: ECHOCARDIOGRAM COMPLETE  Result Date: 10/19/2022    ECHOCARDIOGRAM REPORT   Patient Name:   DIERRE BRAHAM Date of Exam: 10/19/2022 Medical Rec #:  [id number]    Height:       70.0 in Accession #:    [id number]   Weight:       242.3 lb Date of Birth:  06-25-1950    BSA:          2.265 m Patient Age:    71 years     BP:           171/100 mmHg Patient Gender: M            HR:           101 bpm. Exam Location:  Inpatient Procedure: 2D Echo, Cardiac Doppler and Color Doppler  Indications:    Bacteremia  History:        Patient has prior history of Echocardiogram examinations, most                 recent 12/15/2021. CAD and Previous Myocardial Infarction,                 Arrythmias:Atrial Fibrillation,                 Signs/Symptoms:Dizziness/Lightheadedness; Risk                 Factors:Dyslipidemia, Sleep Apnea and Hypertension.  Sonographer:    Wallie Char Referring Phys: Lanae Boast  Sonographer Comments: Image acquisition challenging due to patient body habitus. IMPRESSIONS  1. Left ventricular ejection fraction, by estimation, is 60 to 65%. The left ventricle has normal function. The left ventricle has no regional wall motion abnormalities. There is mild concentric left ventricular hypertrophy. Left ventricular diastolic function could not be evaluated. Elevated left atrial pressure.  2. Right ventricular systolic function is normal. The right ventricular size is normal.  3. A small pericardial effusion is present.  4. The mitral valve is normal in structure. Mild mitral valve regurgitation. No evidence of mitral stenosis.  5. The aortic valve is normal in structure. Aortic valve regurgitation is trivial. Moderate aortic valve stenosis.  6. Aortic dilatation noted. There is mild dilatation of the aortic root, measuring 39 mm. There is mild dilatation of the ascending aorta, measuring 41 mm.  7. The inferior vena cava is dilated in size with >50% respiratory variability, suggesting right atrial pressure of 8 mmHg. FINDINGS  Left Ventricle: Left ventricular ejection fraction, by estimation, is 60 to 65%. The left ventricle has normal function. The left ventricle has no regional wall motion abnormalities. The left ventricular internal cavity size was normal in size. There is  mild concentric left ventricular hypertrophy. Left ventricular diastolic function could not be evaluated due to atrial fibrillation. Left ventricular diastolic function could not be evaluated. Elevated left  atrial pressure. Right Ventricle: The right ventricular size is normal. Right ventricular systolic function is normal. Left Atrium: Left atrial size was normal in size. Right Atrium: Right atrial size was normal in size. Pericardium: A small pericardial effusion is present. Mitral Valve: The mitral valve is normal in structure. Mild mitral valve regurgitation. No evidence of mitral valve stenosis. MV peak gradient, 7.1 mmHg. The mean mitral valve gradient is 4.0 mmHg. Tricuspid Valve: The tricuspid valve is normal in structure. Tricuspid valve regurgitation is trivial. No evidence of tricuspid stenosis. Aortic Valve: The aortic valve is normal in structure. Aortic valve regurgitation is trivial. Aortic regurgitation PHT  measures 684 msec. Moderate aortic stenosis is present. Aortic valve mean gradient measures 24.0 mmHg. Aortic valve peak gradient measures 44.7 mmHg. Aortic valve area, by VTI measures 1.49 cm. Pulmonic Valve: The pulmonic valve was normal in structure. Pulmonic valve regurgitation is trivial. No evidence of pulmonic stenosis. Aorta: Aortic dilatation noted. There is mild dilatation of the aortic root, measuring 39 mm. There is mild dilatation of the ascending aorta, measuring 41 mm. Venous: The inferior vena cava is dilated in size with greater than 50% respiratory variability, suggesting right atrial pressure of 8 mmHg. IAS/Shunts: No atrial level shunt detected by color flow Doppler.  LEFT VENTRICLE PLAX 2D LVIDd:         5.10 cm     Diastology LVIDs:         3.60 cm     LV e' medial:    7.84 cm/s LV PW:         1.00 cm     LV E/e' medial:  17.0 LV IVS:        1.10 cm     LV e' lateral:   8.33 cm/s LVOT diam:     1.90 cm     LV E/e' lateral: 16.0 LV SV:         93 LV SV Index:   41 LVOT Area:     2.84 cm  LV Volumes (MOD) LV vol d, MOD A2C: 94.2 ml LV vol d, MOD A4C: 95.3 ml LV vol s, MOD A2C: 43.1 ml LV vol s, MOD A4C: 42.5 ml LV SV MOD A2C:     51.1 ml LV SV MOD A4C:     95.3 ml LV SV MOD BP:       54.4 ml RIGHT VENTRICLE             IVC RV Basal diam:  4.00 cm     IVC diam: 2.80 cm RV S prime:     12.47 cm/s TAPSE (M-mode): 2.2 cm LEFT ATRIUM             Index        RIGHT ATRIUM           Index LA diam:        4.30 cm 1.90 cm/m   RA Area:     20.90 cm LA Vol (A2C):   77.1 ml 34.05 ml/m  RA Volume:   56.40 ml  24.91 ml/m LA Vol (A4C):   69.0 ml 30.47 ml/m LA Biplane Vol: 73.9 ml 32.63 ml/m  AORTIC VALVE AV Area (Vmax):    1.40 cm AV Area (Vmean):   1.52 cm AV Area (VTI):     1.49 cm AV Vmax:           334.25 cm/s AV Vmean:          230.500 cm/s AV VTI:            0.625 m AV Peak Grad:      44.7 mmHg AV Mean Grad:      24.0 mmHg LVOT Vmax:         165.00 cm/s LVOT Vmean:        123.667 cm/s LVOT VTI:          0.328 m LVOT/AV VTI ratio: 0.52 AI PHT:            684 msec  AORTA Ao Root diam: 3.90 cm Ao Asc diam:  4.10 cm MITRAL VALVE  TRICUSPID VALVE MV Area (PHT): 4.22 cm     TR Peak grad:   29.8 mmHg MV Area VTI:   4.02 cm     TR Vmax:        273.00 cm/s MV Peak grad:  7.1 mmHg MV Mean grad:  4.0 mmHg     SHUNTS MV Vmax:       1.33 m/s     Systemic VTI:  0.33 m MV Vmean:      93.0 cm/s    Systemic Diam: 1.90 cm MV Decel Time: 180 msec MV E velocity: 133.67 cm/s Olga Millers MD Electronically signed by Olga Millers MD Signature Date/Time: 10/19/2022/10:20:40 AM    Final    DG Chest Port 1 View  Result Date: 10/18/2022 CLINICAL DATA:  Shortness of breath EXAM: PORTABLE CHEST 1 VIEW COMPARISON:  10/17/2022 FINDINGS: Cardiac enlargement. Diffuse interstitial pattern to the lungs likely representing edema. Similar appearance to previous study. No pleural effusion or pneumothorax. Mediastinal contours appear intact. IMPRESSION: Cardiac enlargement with diffuse interstitial pattern to the lungs likely edema. No change. Electronically Signed   By: Burman Nieves M.D.   On: 10/18/2022 18:54     LOS: 3 days   Lanae Boast, MD [facility]  10/20/2022, 11:30 AM

## 2022-10-20 NOTE — Progress Notes (Signed)
ANTICOAGULATION CONSULT NOTE Pharmacy Consult for Warfarin  Indication: atrial fibrillation  Allergies  Allergen Reactions   Statins Other (See Comments)    "Muscles were messed up"     Vital Signs: Temp: 97.9 F (36.6 C) (08/15 0807) Temp Source: Oral (08/15 0807) BP: 180/98 (08/15 0807) Pulse Rate: 96 (08/15 0807)  Labs: Recent Labs    10/18/22 0242 10/18/22 0617 10/19/22 0734 10/20/22 0157  HGB 10.3*  --  10.7* 10.5*  HCT 30.8*  --  32.1* 31.1*  PLT 212  --  239 256  LABPROT  --  33.8* 42.2* 34.7*  INR  --  3.3* 4.4* 3.4*  CREATININE 0.92  --  0.99 0.99    Estimated Creatinine Clearance: 83.8 mL/min (by C-G formula based on SCr of 0.99 mg/dL).   Medical History: Past Medical History:  Diagnosis Date   Adenomatous colon polyp 07/2003   Heart valve problem    Dr [name]   Hypertension    Mitral valve disorders(424.0)    Nonrheumatic aortic valve stenosis 01/14/2021   Echo 9/22: EF 60-65, small pericardial effusion, mild AS (mean gradient 16 mmHg), trivial AI, mild MR (MR is eccentric - may be underestimated) // Echo 10/2021: EF 60-65, mod calcification of AV, trivial AI, mod-severe AS (DI 0.25, V-max 350 cm/s, mean 28.8), no RWMA, NL RVSF, severe LAE, mild RAE, small effusion w/o tamponade, ?mild-moderate MR (difficult due to eccentric jet)   Nonrheumatic mitral valve regurgitation 01/14/2021   Echo 10/2021: EF 60-65, moderate calcification of the aortic valve, trivial AI, moderate to severe aortic stenosis (DI 0.25, V-max 350 cm/s, mean gradient 28.8), no RWMA, normal RVSF, severe LAE, mild RAE, small pericardial effusion without tamponade, suspect mild-moderate MR (difficult to qualify degree due to eccentric jet with coanda affect)   Obesity    OSA (obstructive sleep apnea)    cpap   Other and unspecified hyperlipidemia    Permanent atrial fibrillation (HCC)    Zio Monitor 5/22: AFib - Avg HR 55; slowest 32; longest pause 3.9 sec; occ PVCs, 4 beat NSVT    Sleep apnea     Assessment: 72 y/o M present to the ED with generalized weakness. On warfarin PTA for afib. INR is supra-therapeutic at 3.8 on admission 8/12 PM, trended down close to therapeutic at 3.3 8/13 and warfarin dose was given (missed dose 8/12 PTA). INR trended back up to 4.4 and dose was held 8/14. INR down to 3.4 today.    Noted DDI with azithromycin received x 1 dose on 8/12 - likely contributing to INR increase. CBC stable. No bleed issues reported.   PTA dose - 5mg  daily except 7.5mg  qMWF per last outpatient anticoagulation clinic note 7/31 and med rec, INR 2.2 (last dose pta 8/11)  Goal of Therapy:  INR 2-3 Monitor platelets by anticoagulation protocol: Yes   Plan:  Hold warfarin  again today with supratherapeutic INR - likely to resume tomorrow.  Monitor daily INR, CBC, s/sx bleeding   Gwynn Burly, PharmD 10/20/2022 11:44 AM

## 2022-10-20 NOTE — Progress Notes (Signed)
Occupational Therapy Treatment Patient Details Name: [name] MRN: [id number] DOB: October 23, 1950 Today's Date: 10/20/2022   History of present illness Pt is a 72 y.o. male admitted 10/17/22 with a "viral type illness" resulting in weakness, fatigue and fall calling EMS. Workup for CAP. PMH includes HTN, moderate AS, afib, HLD, OSA, morbid obesity.   OT comments  Pt progressing toward established OT goals. Limited by DOE despite SpO2>92%, decreased activity tolerance and L knee buckling during gait with fatigue. Pt walking to sink but needing to sit prior to turning toward sink. Rose again for oral care at sink with constant min A for rise. Pt unable to bring hands off sink while maintaining balance, so performing seated. Session limited by lab in room 8 minutes. Will continue to follow. HHOT remains an appropriate plan for discharge.       If plan is discharge home, recommend the following:  A little help with walking and/or transfers;A lot of help with bathing/dressing/bathroom;Assist for transportation;Help with stairs or ramp for entrance;Assistance with cooking/housework   Equipment Recommendations  None recommended by OT (Pt has all needed DME)    Recommendations for Other Services      Precautions / Restrictions Precautions Precautions: Fall Restrictions Weight Bearing Restrictions: No       Mobility Bed Mobility Overal bed mobility: Needs Assistance Bed Mobility: Supine to Sit     Supine to sit: Min assist     General bed mobility comments: nearly constant assist for BLE. Pt sleeps in lazy boy recliner at home.l    Transfers Overall transfer level: Needs assistance Equipment used: Rolling walker (2 wheels) Transfers: Sit to/from Stand Sit to Stand: Contact guard assist, From elevated surface, Min assist           General transfer comment: RW backwards initially for pt to pull up from, then turned around for transfer. This is how pt performs transfers at home  and reports he will do it this way at home regardless of any education provided, so allowing and providing feedback to optimize safety. Contstant Min A lifting assist to rise from chair at sink     Balance Overall balance assessment: Needs assistance Sitting-balance support: No upper extremity supported, Feet supported, Feet unsupported Sitting balance-Leahy Scale: Fair Sitting balance - Comments: assist to don/doff shoes sitting EOB. Nephew iondep assisting   Standing balance support: During functional activity, Single extremity supported, Bilateral upper extremity supported, Reliant on assistive device for balance Standing balance-Leahy Scale: Poor Standing balance comment: reliant on external supporrt                           ADL either performed or assessed with clinical judgement   ADL Overall ADL's : Needs assistance/impaired     Grooming: Set up;Sitting Grooming Details (indicate cue type and reason): initially standing at sink but reliant on BUE to maintain standing at this time and with significant increased work of breathing, however, continues to have SpO2 >90                 Toilet Transfer: Contact guard assist;Ambulation;Rolling walker (2 wheels) Toilet Transfer Details (indicate cue type and reason): ~5 ft; CGA for safety         Functional mobility during ADLs: Contact guard assist;Rolling walker (2 wheels)      Extremity/Trunk Assessment Upper Extremity Assessment Upper Extremity Assessment: Generalized weakness   Lower Extremity Assessment Lower Extremity Assessment: Defer to PT evaluation  Vision   Vision Assessment?: No apparent visual deficits   Perception Perception Perception: Not tested   Praxis Praxis Praxis: Not tested    Cognition Arousal: Alert Behavior During Therapy: WFL for tasks assessed/performed Overall Cognitive Status: Within Functional Limits for tasks assessed                                           Exercises      Shoulder Instructions       General Comments increased WOB during mobility of ~17ft, but SpO2 WFL as well as HR. Session loimited by lab present to draw blood.    Pertinent Vitals/ Pain       Pain Assessment Pain Assessment: Faces Faces Pain Scale: Hurts even more Pain Location: back Pain Descriptors / Indicators: Discomfort, Grimacing Pain Intervention(s): Monitored during session, Limited activity within patient's tolerance  Home Living                                          Prior Functioning/Environment              Frequency  Min 1X/week        Progress Toward Goals  OT Goals(current goals can now be found in the care plan section)  Progress towards OT goals: Progressing toward goals  Acute Rehab OT Goals Patient Stated Goal: get better OT Goal Formulation: With patient Time For Goal Achievement: 11/01/22 Potential to Achieve Goals: Good ADL Goals Pt Will Perform Upper Body Dressing: with supervision;sitting Pt Will Perform Lower Body Dressing: with contact guard assist;sitting/lateral leans;sit to/from stand;with adaptive equipment Pt Will Transfer to Toilet: with supervision;ambulating;regular height toilet Additional ADL Goal #1: pt will be able to stand x5 min for functional task in order to improve activity tolerance for ADLs  Plan      Co-evaluation                 AM-PAC OT "6 Clicks" Daily Activity     Outcome Measure   Help from another person eating meals?: None Help from another person taking care of personal grooming?: A Little Help from another person toileting, which includes using toliet, bedpan, or urinal?: A Lot Help from another person bathing (including washing, rinsing, drying)?: A Lot Help from another person to put on and taking off regular upper body clothing?: A Little Help from another person to put on and taking off regular lower body clothing?: A Lot 6 Click Score:  16    End of Session Equipment Utilized During Treatment: Gait belt;Rolling walker (2 wheels)  OT Visit Diagnosis: Unsteadiness on feet (R26.81);Other abnormalities of gait and mobility (R26.89);Muscle weakness (generalized) (M62.81);History of falling (Z91.81)   Activity Tolerance Patient tolerated treatment well   Patient Left in chair;with call bell/phone within reach;with family/visitor present (family member reporting chair alarm kept going off last time due to adjustments in seat and reproting that he will call RN before pt gets up)   Nurse Communication Mobility status        Time: 1100-1136 OT Time Calculation (min): 36 min  Charges: OT General Charges $OT Visit: 1 Visit OT Treatments $Self Care/Home Management : 8-22 mins $Therapeutic Activity: 8-22 mins  Myrla Halsted, OTD, OTR/L [facility] Office: [phone]   Myrla Halsted 10/20/2022,  12:10 PM

## 2022-10-21 DIAGNOSIS — N179 Acute kidney failure, unspecified: Secondary | ICD-10-CM | POA: Diagnosis not present

## 2022-10-21 DIAGNOSIS — R6520 Severe sepsis without septic shock: Secondary | ICD-10-CM | POA: Diagnosis not present

## 2022-10-21 DIAGNOSIS — A401 Sepsis due to streptococcus, group B: Secondary | ICD-10-CM | POA: Diagnosis not present

## 2022-10-21 LAB — CBC
HCT: 31.1 % — ABNORMAL LOW (ref 39.0–52.0)
Hemoglobin: 10.4 g/dL — ABNORMAL LOW (ref 13.0–17.0)
MCH: 29.7 pg (ref 26.0–34.0)
MCHC: 33.4 g/dL (ref 30.0–36.0)
MCV: 88.9 fL (ref 80.0–100.0)
Platelets: 275 10*3/uL (ref 150–400)
RBC: 3.5 MIL/uL — ABNORMAL LOW (ref 4.22–5.81)
RDW: 14 % (ref 11.5–15.5)
WBC: 7.6 10*3/uL (ref 4.0–10.5)
nRBC: 0 % (ref 0.0–0.2)

## 2022-10-21 LAB — BASIC METABOLIC PANEL
Anion gap: 10 (ref 5–15)
BUN: 19 mg/dL (ref 8–23)
CO2: 23 mmol/L (ref 22–32)
Calcium: 9.9 mg/dL (ref 8.9–10.3)
Chloride: 102 mmol/L (ref 98–111)
Creatinine, Ser: 0.76 mg/dL (ref 0.61–1.24)
GFR, Estimated: >60 mL/min (ref >60)
Glucose, Bld: 120 mg/dL — ABNORMAL HIGH (ref 70–99)
Potassium: 3 mmol/L — ABNORMAL LOW (ref 3.5–5.1)
Sodium: 135 mmol/L (ref 135–145)

## 2022-10-21 LAB — MAGNESIUM: Magnesium: 1.9 mg/dL (ref 1.7–2.4)

## 2022-10-21 LAB — PROTIME-INR
INR: 2.7 — ABNORMAL HIGH (ref 0.8–1.2)
Prothrombin Time: 29 s — ABNORMAL HIGH (ref 11.4–15.2)

## 2022-10-21 LAB — PHOSPHORUS: Phosphorus: 1.5 mg/dL — ABNORMAL LOW (ref 2.5–4.6)

## 2022-10-21 MED ORDER — MEDIHONEY WOUND/BURN DRESSING EX PSTE
1.0000 | PASTE | Freq: Every day | CUTANEOUS | Status: DC
  Administered 2022-10-22: 1 via TOPICAL
  Filled 2022-10-21 (×2): qty 44

## 2022-10-21 MED ORDER — POTASSIUM CHLORIDE CRYS ER 20 MEQ PO TBCR
20.0000 meq | EXTENDED_RELEASE_TABLET | Freq: Every day | ORAL | Status: DC
  Administered 2022-10-22 – 2022-10-30 (×9): 20 meq via ORAL
  Filled 2022-10-21 (×9): qty 1

## 2022-10-21 MED ORDER — POTASSIUM PHOSPHATES 15 MMOLE/5ML IV SOLN
30.0000 mmol | Freq: Once | INTRAVENOUS | Status: AC
  Administered 2022-10-21: 30 mmol via INTRAVENOUS
  Filled 2022-10-21: qty 10

## 2022-10-21 MED ORDER — WARFARIN SODIUM 5 MG PO TABS
5.0000 mg | ORAL_TABLET | Freq: Once | ORAL | Status: AC
  Administered 2022-10-21: 5 mg via ORAL
  Filled 2022-10-21: qty 1

## 2022-10-21 MED ORDER — MAGNESIUM OXIDE -MG SUPPLEMENT 400 (240 MG) MG PO TABS
400.0000 mg | ORAL_TABLET | Freq: Two times a day (BID) | ORAL | Status: DC
  Administered 2022-10-21 – 2022-10-30 (×19): 400 mg via ORAL
  Filled 2022-10-21 (×19): qty 1

## 2022-10-21 MED ORDER — POTASSIUM CHLORIDE CRYS ER 20 MEQ PO TBCR
40.0000 meq | EXTENDED_RELEASE_TABLET | Freq: Two times a day (BID) | ORAL | Status: AC
  Administered 2022-10-21 (×2): 40 meq via ORAL
  Filled 2022-10-21 (×2): qty 2

## 2022-10-21 NOTE — Progress Notes (Signed)
ANTICOAGULATION CONSULT NOTE - Follow Up Consult  Pharmacy Consult for Warfarin Indication: atrial fibrillation  Allergies  Allergen Reactions   Statins Other (See Comments)    "Muscles were messed up"     Patient Measurements: Height: 5\' 10"  (177.8 cm) Weight: 109.9 kg (242 lb 4.6 oz) IBW/kg (Calculated) : 73  Vital Signs: Temp: 97.6 F (36.4 C) (08/16 0341) Temp Source: Oral (08/16 0341) BP: 176/110 (08/16 0747) Pulse Rate: 62 (08/16 0835)  Labs: Recent Labs    10/19/22 0734 10/20/22 0157 10/21/22 0911  HGB 10.7* 10.5* 10.4*  HCT 32.1* 31.1* 31.1*  PLT 239 256 275  LABPROT 42.2* 34.7* 29.0*  INR 4.4* 3.4* 2.7*  CREATININE 0.99 0.99 0.76    Estimated Creatinine Clearance: 103.7 mL/min (by C-G formula based on SCr of 0.76 mg/dL).  Assessment: 72 y/o M presented to the ED on 10/17/22 with generalized weakness. On warfarin PTA for afib. INR supratherapeutic on admit at 3.8; trended down close to therapeutic at 3.3 on 8/13 and warfarin 5 mg x 1 was given (missed dose 8/12 PTA). INR trended back up to 4.4 on 8/14 and doses were held 8/14 and 8/15 when INR remained >3.  INR down to 2.7 today, in target range.    Noted DDI with azithromycin received x 1 dose on 8/12 - likely contributed to INR increase. CBC stable. No bleeding reported.    PTA warfarin regimen: 7.5 mg MWF and 5 mg TTSS - last outpatient INR 2.2 on 7/31   Goal of Therapy:  INR 2-3 Monitor platelets by anticoagulation protocol: Yes   Plan:  Warfarin 5 mg x 1 today. Lower than usual Friday dose of 7.5 mg. May need higher dose on 8/17 if INR trends down. Daily PT/INR. Monitor for signs/symptoms of bleeding, DDI.  Dennie Fetters, RPh 10/21/2022,1:08 PM

## 2022-10-21 NOTE — Care Management Important Message (Signed)
Important Message  Patient Details  Name: [name] MRN: [id number] Date of Birth: 1951/01/20   Medicare Important Message Given:  Yes     Zyaire Mccleod Stefan Church 10/21/2022, 2:46 PM

## 2022-10-21 NOTE — Consult Note (Signed)
WOC Nurse Consult Note: Reason for Consult:RLE with dry desquamation. See photos taken and uploaded to EMR by Provider. Wound type:venous insufficiency with superimposed infection (cellulitis) Pressure Injury POA: N/A Measurement:N/A; fissures and cracks in skin Wound bed:red Drainage (amount, consistency, odor) none Periwound:erythema Dressing procedure/placement/frequency: I have provided Nursing with guidance for daily wound care to the RLE while in house using Vashe pure hypochlorous  acid to cleanse the leg [place] # [phone]). Following cleansing, Vashe is to be applied to an ABD pad and placed over  the desquamated (dry, cracked) areas on RLE and allowed to soak for 10 minutes. Pat dry. Leptospermum [place]  honey (MediHoney) is to be applied to the RLE areas of concern, then topped with a dry ABD pad/pads and secured with Kerlix roll  gauze/paper tape.   Feet are to be placed into Prevalon boots.  WOC nursing team will not follow, but will remain available to this patient, the nursing and medical teams.   Please re-consult if needed.  Thank you for inviting [place] to participate in this patient's Plan of Care.  Ladona Mow, MSN, RN, CNS, GNP, Leda Min, [employer], [employer] phone:  [phone]

## 2022-10-21 NOTE — Progress Notes (Signed)
Progress Note   Patient: [name] VWU:[id number] DOB: 28-Jul-1950 DOA: 10/17/2022     4 DOS: the patient was seen and examined on 10/21/2022   Brief hospital course: 43 yom w/ HTN, moderate aortic stenosis, mitral valve prolapse, atrial fibrillation, hyperlipidemia, OSA not on CPAP, HTN, morbid obesity who erarlier this week developed what he calls a viral type illness w/ shakes and chills which resolved but has become weaker and weaker since and eventually fell,did not hit his head and there was no loss of consciousness.  911 was called and brought to the ED. In the ED: Tmax 100.6.,WBC 22.5, INR 3.8. Chest x-ray cardiomegaly with pulmonary vascular congestion, possibly edema versus infiltrate.  Lactic acid 5.0.  Doing well on room air, blood culture obtained Rocephin azithromycin given, UA unremarkable.  Initial troponin.  Bolus normal saline. Patient was admitted for further management Repeat lactic acid downtrending 3.4, Pro-Cal is up to 2.3 WBC downtrending 20.6. 8/13: Blood culture came back positive for Streptococcus. 8/15: Seen by ID who recommended TEE.  Assessment and Plan: Principal Problem:   CAP (community acquired pneumonia) In the setting off:   Sepsis due to Streptococcus, group B (HCC) TEE is scheduled for 10/25/2022. Continue cefazolin 2 g every 8 hours IVPB. IBD input appreciated.  Active Problems:   OBSTRUCTIVE SLEEP APNEA Does not use CPAP at home. Would benefit from CPAP at bedtime.    ATRIAL FIBRILLATION CHA?DS?-VASc of at least 3. Continue warfarin per pharmacy. On clonidine, which may be controlling heart rate.    Nonrheumatic aortic valve stenosis Continue warfarin per pharmacy.    CAD (coronary artery disease) On warfarin per pharmacy. Continue losartan 100 mg p.o. daily.    HTN (hypertension) Continue ARB as above. Continue hydrochlorothiazide 12.5 mg p.o. daily. Continue doxazosin 4 mg p.o. twice daily. (Could doxazosin and clonidine switch to  beta-blocker?)    Aortic dilatation (HCC) Moderate aortic stenosis/mitral regurgitation EF 60-65% no RWMA, small pericardial effusion,  mitral valve is normal aortic valve is normal  mild dilatation of aortic root and ascending aorta no interatrial shunt.     Dyslipidemia Currently not on medical therapy. Follow-up with PCP.  Subjective: No acute distress.  His nephew Christiane Ha was with him.  Physical Exam: Vitals:   10/20/22 1602 10/20/22 1953 10/20/22 2338 10/21/22 0341  BP: (!) 148/102 (!) 156/106 128/74 (!) 160/79  Pulse: 98 91 87 86  Resp: 18 18 18 16   Temp: 97.6 F (36.4 C) 97.8 F (36.6 C) 97.7 F (36.5 C) 97.6 F (36.4 C)  TempSrc: Oral   Oral  SpO2: 95% 94% 98% 95%  Weight:      Height:       Physical Exam Vitals and nursing note reviewed.  Constitutional:      General: He is awake. He is not in acute distress.    Appearance: Normal appearance. He is obese. He is ill-appearing.  HENT:     Head: Normocephalic.     Nose: No rhinorrhea.     Mouth/Throat:     Mouth: Mucous membranes are moist.  Eyes:     General: No scleral icterus.    Pupils: Pupils are equal, round, and reactive to light.  Neck:     Vascular: No JVD.  Cardiovascular:     Rate and Rhythm: Normal rate and regular rhythm.     Heart sounds: S1 normal and S2 normal.  Pulmonary:     Effort: Pulmonary effort is normal.     Breath sounds: Normal  breath sounds. No wheezing, rhonchi or rales.  Abdominal:     General: Bowel sounds are normal. There is no distension.     Palpations: Abdomen is soft.     Tenderness: There is no abdominal tenderness.  Musculoskeletal:     Cervical back: Neck supple.     Right lower leg: No edema.     Left lower leg: No edema.  Skin:    General: Skin is warm and dry.     Findings: Erythema and rash present.     Comments: Left pretibial area erythema with skin crusting.  Please see pictures below.  Neurological:     General: No focal deficit present.     Mental  Status: He is alert. He is disoriented.  Psychiatric:        Mood and Affect: Mood normal.        Behavior: Behavior normal. Behavior is cooperative.           Data Reviewed:  There are no new results to review at this time.  Family Communication:   Disposition: Status is: Inpatient Remains inpatient appropriate because:   Planned Discharge Destination: Home  Time spent: 35 minutes  Author: Bobette Mo, MD 10/21/2022 7:40 AM  For on call review [email or web address].   This document was prepared using Dragon voice recognition software and may contain some unintended transcription errors.

## 2022-10-21 NOTE — Progress Notes (Signed)
Brief ID Note:   TEE planned for 8/20. Dr. Renold Don is available over the weekend for ID related questions / concerns.   Otherwise will follow up next week after study is done to determine recs related to infection treatment. No changes as of now - continue cefazolin IV.    Rexene Alberts, MSN, NP-C [facility]  [email or web address] Pager: [phone] Office: [phone] [facility] Main Line: [phone] *Secure Chat Communication Welcome

## 2022-10-22 LAB — CBC
HCT: 31.7 % — ABNORMAL LOW (ref 39.0–52.0)
Hemoglobin: 10.5 g/dL — ABNORMAL LOW (ref 13.0–17.0)
MCH: 29.6 pg (ref 26.0–34.0)
MCHC: 33.1 g/dL (ref 30.0–36.0)
MCV: 89.3 fL (ref 80.0–100.0)
Platelets: 281 10*3/uL (ref 150–400)
RBC: 3.55 MIL/uL — ABNORMAL LOW (ref 4.22–5.81)
RDW: 14 % (ref 11.5–15.5)
WBC: 8.7 10*3/uL (ref 4.0–10.5)
nRBC: 0 % (ref 0.0–0.2)

## 2022-10-22 LAB — BASIC METABOLIC PANEL
Anion gap: 7 (ref 5–15)
BUN: 16 mg/dL (ref 8–23)
CO2: 25 mmol/L (ref 22–32)
Calcium: 10 mg/dL (ref 8.9–10.3)
Chloride: 105 mmol/L (ref 98–111)
Creatinine, Ser: 1.05 mg/dL (ref 0.61–1.24)
GFR, Estimated: >60 mL/min (ref >60)
Glucose, Bld: 112 mg/dL — ABNORMAL HIGH (ref 70–99)
Potassium: 4.4 mmol/L (ref 3.5–5.1)
Sodium: 137 mmol/L (ref 135–145)

## 2022-10-22 LAB — PROTIME-INR
INR: 2.5 — ABNORMAL HIGH (ref 0.8–1.2)
Prothrombin Time: 26.9 s — ABNORMAL HIGH (ref 11.4–15.2)

## 2022-10-22 MED ORDER — WARFARIN SODIUM 5 MG PO TABS
5.0000 mg | ORAL_TABLET | Freq: Once | ORAL | Status: AC
  Administered 2022-10-22: 5 mg via ORAL
  Filled 2022-10-22: qty 1

## 2022-10-22 NOTE — Progress Notes (Signed)
Progress Note   Patient: [name] ZOX:[id number] DOB: 1950/03/13 DOA: 10/17/2022     5 DOS: the patient was seen and examined on 10/22/2022   Brief hospital course: 86 yom w/ HTN, moderate aortic stenosis, mitral valve prolapse, atrial fibrillation, hyperlipidemia, OSA not on CPAP, HTN, morbid obesity who erarlier this week developed what he calls a viral type illness w/ shakes and chills which resolved but has become weaker and weaker since and eventually fell,did not hit his head and there was no loss of consciousness.  911 was called and brought to the ED. In the ED: Tmax 100.6.,WBC 22.5, INR 3.8. Chest x-ray cardiomegaly with pulmonary vascular congestion, possibly edema versus infiltrate.  Lactic acid 5.0.  Doing well on room air, blood culture obtained Rocephin azithromycin given, UA unremarkable.  Initial troponin.  Bolus normal saline. Patient was admitted for further management Repeat lactic acid downtrending 3.4, Pro-Cal is up to 2.3 WBC downtrending 20.6. 8/13: Blood culture came back positive for Streptococcus. 8/15: Seen by ID who recommended TEE. 8/16: Seen by ID: continue cefazolin IV   Assessment and Plan:  Sepsis secondary to streptococcal bacteremia and community-acquired pneumonia: Vitals are stable this a.m. Leukocytosis resolved Reviewed ID recommendations: Continue IV cefazolin 2 g every 8 hours Patient is scheduled for TEE by Dr. Renold Don on August 20  Anemia: Chronic, hemoglobin stable at 10.5 this a.m. Follow-up CBC  Hypokalemia: replaced, resolved  Obstructive sleep apnea: Stable, Does not use CPAP at home. Would benefit from CPAP at bedtime.   Chronic atrial fibrillation: Rate controlled CHA?DS?-VASc of at least 3. INR is therapeutic at 2.5 this a.m., continue warfarin per pharmacy.  Follow-up PT/INR On clonidine, which may be controlling heart rate.  Nonrheumatic aortic valve stenosis: Continue Coumadin   CAD (coronary artery disease): No acute chest  symptoms On warfarin per pharmacy. Continue losartan 100 mg p.o. daily.   HTN (hypertension): Moderately controlled at 146/84 this a.m. Continue ARB, hydrochlorothiazide 12.5 mg p.o. daily, doxazosin 4 mg p.o. twice daily. (Could doxazosin and clonidine switch to beta-blocker?)   Aortic dilatation, Moderate aortic stenosis/mitral regurgitation EF 60-65% no RWMA, small pericardial effusion,  mitral valve is normal aortic valve is normal  mild dilatation of aortic root and ascending aorta no interatrial shunt.    Dyslipidemia Currently not on medical therapy. Follow-up with PCP.  Physical debility/deconditioning: PT therapy on board- last note 2days ago. Notified RN to get back PT to see him today     Subjective: Patient reported mild shortness of breath, denies cough, fever, chills, chest pain, abdominal symptoms  Reports having generalized weakness, pain in his back which is chronic Son at bedside: Mentioned that he has difficulty working with PT because of back pain and requests for PT evaluation again today.  Physical Exam: Vitals:   10/21/22 1722 10/21/22 2033 10/22/22 0602 10/22/22 0755  BP: (!) 168/83 (!) 150/86 (!) 148/91 (!) 146/84  Pulse: 96 77 80 99  Resp: 20 18 18    Temp: 98.5 F (36.9 C) 97.7 F (36.5 C) 97.7 F (36.5 C) 97.9 F (36.6 C)  TempSrc: Oral Oral  Oral  SpO2: 99% 99% 97% 97%  Weight:      Height:       Physical Exam Constitutional:      Appearance: He is obese. He is ill-appearing and toxic-appearing.  HENT:     Head: Normocephalic and atraumatic.     Nose: Nose normal. No congestion.     Mouth/Throat:     Mouth:  Mucous membranes are moist.     Pharynx: Oropharynx is clear. No oropharyngeal exudate.  Eyes:     Extraocular Movements: Extraocular movements intact.     Conjunctiva/sclera: Conjunctivae normal.     Pupils: Pupils are equal, round, and reactive to light.  Cardiovascular:     Rate and Rhythm: Normal rate. Rhythm irregular.      Pulses: Normal pulses.     Heart sounds: Murmur heard.  Pulmonary:     Effort: Pulmonary effort is normal.     Breath sounds: Rhonchi present.  Abdominal:     General: Abdomen is flat. Bowel sounds are normal.     Palpations: Abdomen is soft.  Musculoskeletal:     Cervical back: Normal range of motion and neck supple.     Comments: Trace edema in legs b/l  Skin:    General: Skin is warm.     Capillary Refill: Capillary refill takes 2 to 3 seconds.     Comments: Erythema in Rt Lower leg improving, scaling improving  Neurological:     General: No focal deficit present.     Mental Status: He is alert and oriented to person, place, and time.     Cranial Nerves: No cranial nerve deficit.     Sensory: No sensory deficit.  Psychiatric:        Mood and Affect: Mood normal.        Behavior: Behavior normal.     Data Reviewed:  Latest Reference Range & Units 10/22/22 06:51  Sodium 135 - 145 mmol/L 137  Potassium 3.5 - 5.1 mmol/L 4.4  Chloride 98 - 111 mmol/L 105  CO2 22 - 32 mmol/L 25  Glucose 70 - 99 mg/dL 981 (H)  BUN 8 - 23 mg/dL 16  Creatinine 1.91 - 4.78 mg/dL 2.95  Calcium 8.9 - 62.1 mg/dL 30.8  Anion gap 5 - 15  7  GFR, Estimated >60 mL/min >60  WBC 4.0 - 10.5 K/uL 8.7  RBC 4.22 - 5.81 MIL/uL 3.55 (L)  Hemoglobin 13.0 - 17.0 g/dL 65.7 (L)  HCT 84.6 - 96.2 % 31.7 (L)  MCV 80.0 - 100.0 fL 89.3  MCH 26.0 - 34.0 pg 29.6  MCHC 30.0 - 36.0 g/dL 95.2  RDW 84.1 - 32.4 % 14.0  Platelets 150 - 400 K/uL 281  nRBC 0.0 - 0.2 % 0.0  Prothrombin Time 11.4 - 15.2 seconds 26.9 (H)  INR 0.8 - 1.2  2.5 (H)  (H): Data is abnormally high (L): Data is abnormally low  Family Communication: Updated plan of care with patient's son at bedside today  Disposition: Status is: Inpatient   Planned Discharge Destination: Home    Time spent: 35 minutes  Author: Ernestene Mention, MD 10/22/2022 12:09 PM  For on call review [email or web address].

## 2022-10-22 NOTE — Progress Notes (Signed)
ANTICOAGULATION CONSULT NOTE - Follow Up Consult  Pharmacy Consult for Warfarin Indication: atrial fibrillation  Allergies  Allergen Reactions   Statins Other (See Comments)    "Muscles were messed up"     Patient Measurements: Height: 5\' 10"  (177.8 cm) Weight: 109.9 kg (242 lb 4.6 oz) IBW/kg (Calculated) : 73  Vital Signs: Temp: 97.9 F (36.6 C) (08/17 0755) Temp Source: Oral (08/17 0755) BP: 146/84 (08/17 0755) Pulse Rate: 99 (08/17 0755)  Labs: Recent Labs    10/20/22 0157 10/21/22 0911 10/22/22 0651  HGB 10.5* 10.4* 10.5*  HCT 31.1* 31.1* 31.7*  PLT 256 275 281  LABPROT 34.7* 29.0* 26.9*  INR 3.4* 2.7* 2.5*  CREATININE 0.99 0.76 1.05    Estimated Creatinine Clearance: 79 mL/min (by C-G formula based on SCr of 1.05 mg/dL).  Assessment: 72 y/o M presented to the ED on 10/17/22 with generalized weakness. On warfarin PTA for afib. INR supratherapeutic on admit at 3.8; trended down close to therapeutic at 3.3 on 8/13 and warfarin 5 mg x 1 was given (missed dose 8/12 PTA). INR trended back up to 4.4 on 8/14 and doses were held 8/14 and 8/15 when INR remained >3.   Noted DDI with azithromycin received x 1 dose on 8/12 - likely contributed to INR increase.   PTA warfarin regimen: 7.5 mg MWF and 5 mg TTSS - last outpatient INR 2.2 on 7/31   8/17 AM: INR 2.5 today, therapeutic after warfarin 5 mg resumed on 8/16 (INR down to 2.7 yesterday after holding 2 doses). CBC stable (Hgb 10.5, plt 281). No bleeding noted.    Goal of Therapy:  INR 2-3 Monitor platelets by anticoagulation protocol: Yes   Plan:  Warfarin 5 mg x 1 today May need higher dose on 8/17 if INR trends down. Daily PT/INR. Monitor for signs/symptoms of bleeding, DDI.  Enos Fling, PharmD PGY-1 Acute Care Pharmacy Resident 10/22/2022 8:55 AM

## 2022-10-22 NOTE — Plan of Care (Signed)

## 2022-10-23 LAB — CBC
HCT: 30.9 % — ABNORMAL LOW (ref 39.0–52.0)
Hemoglobin: 10.1 g/dL — ABNORMAL LOW (ref 13.0–17.0)
MCH: 29.9 pg (ref 26.0–34.0)
MCHC: 32.7 g/dL (ref 30.0–36.0)
MCV: 91.4 fL (ref 80.0–100.0)
Platelets: 291 10*3/uL (ref 150–400)
RBC: 3.38 MIL/uL — ABNORMAL LOW (ref 4.22–5.81)
RDW: 14.5 % (ref 11.5–15.5)
WBC: 9.5 10*3/uL (ref 4.0–10.5)
nRBC: 0 % (ref 0.0–0.2)

## 2022-10-23 LAB — BASIC METABOLIC PANEL
Anion gap: 5 (ref 5–15)
BUN: 16 mg/dL (ref 8–23)
CO2: 28 mmol/L (ref 22–32)
Calcium: 10.2 mg/dL (ref 8.9–10.3)
Chloride: 101 mmol/L (ref 98–111)
Creatinine, Ser: 1.1 mg/dL (ref 0.61–1.24)
GFR, Estimated: >60 mL/min (ref >60)
Glucose, Bld: 123 mg/dL — ABNORMAL HIGH (ref 70–99)
Potassium: 4.9 mmol/L (ref 3.5–5.1)
Sodium: 134 mmol/L — ABNORMAL LOW (ref 135–145)

## 2022-10-23 LAB — PROTIME-INR
INR: 2.6 — ABNORMAL HIGH (ref 0.8–1.2)
Prothrombin Time: 28.4 s — ABNORMAL HIGH (ref 11.4–15.2)

## 2022-10-23 LAB — GLUCOSE, CAPILLARY: Glucose-Capillary: 164 mg/dL — ABNORMAL HIGH (ref 70–99)

## 2022-10-23 MED ORDER — AMMONIUM LACTATE 12 % EX LOTN
TOPICAL_LOTION | Freq: Two times a day (BID) | CUTANEOUS | Status: DC
  Filled 2022-10-23: qty 225

## 2022-10-23 MED ORDER — FUROSEMIDE 10 MG/ML IJ SOLN
20.0000 mg | Freq: Once | INTRAMUSCULAR | Status: AC
  Administered 2022-10-23: 20 mg via INTRAVENOUS
  Filled 2022-10-23: qty 2

## 2022-10-23 MED ORDER — WARFARIN SODIUM 5 MG PO TABS
5.0000 mg | ORAL_TABLET | Freq: Once | ORAL | Status: AC
  Administered 2022-10-23: 5 mg via ORAL
  Filled 2022-10-23: qty 1

## 2022-10-23 NOTE — Progress Notes (Signed)
ANTICOAGULATION CONSULT NOTE - Follow Up Consult  Pharmacy Consult for Warfarin Indication: atrial fibrillation  Allergies  Allergen Reactions   Statins Other (See Comments)    "Muscles were messed up"     Patient Measurements: Height: 5\' 10"  (177.8 cm) Weight: 109.9 kg (242 lb 4.6 oz) IBW/kg (Calculated) : 73  Vital Signs: Temp: 97.7 F (36.5 C) (08/18 0813) Temp Source: Oral (08/18 0813) BP: 139/97 (08/18 0813) Pulse Rate: 89 (08/18 0813)  Labs: Recent Labs    10/21/22 0911 10/22/22 0651 10/23/22 0555  HGB 10.4* 10.5* 10.1*  HCT 31.1* 31.7* 30.9*  PLT 275 281 291  LABPROT 29.0* 26.9* 28.4*  INR 2.7* 2.5* 2.6*  CREATININE 0.76 1.05 1.10    Estimated Creatinine Clearance: 75.4 mL/min (by C-G formula based on SCr of 1.1 mg/dL).  Assessment: 72 y/o M presented to the ED on 10/17/22 with generalized weakness. On warfarin PTA for afib. INR supratherapeutic on admit at 3.8; trended down close to therapeutic at 3.3 on 8/13 and warfarin 5 mg x 1 was given (missed dose 8/12 PTA). INR trended back up to 4.4 on 8/14 and doses were held 8/14 and 8/15 when INR remained >3.   Noted DDI with azithromycin received x 1 dose on 8/12 - likely contributed to INR increase.   PTA warfarin regimen: 7.5 mg MWF and 5 mg TTSS - last outpatient INR 2.2 on 7/31   8/18 AM: INR 2.6 today, stable and therapeutic after warfarin 5 mg resumed on 8/16 (INR trended down to 2.5 after holding 2 doses). CBC stable (Hgb 10.1, plt 291). No bleeding noted.    Goal of Therapy:  INR 2-3 Monitor platelets by anticoagulation protocol: Yes   Plan:  Warfarin 5 mg x 1 today Daily PT/INR. Monitor for signs/symptoms of bleeding and DDI.  Enos Fling, PharmD PGY-1 Acute Care Pharmacy Resident 10/23/2022 8:19 AM

## 2022-10-23 NOTE — Progress Notes (Signed)
   10/23/22 2000  BiPAP/CPAP/SIPAP  Reason BIPAP/CPAP not in use Non-compliant

## 2022-10-23 NOTE — Progress Notes (Signed)
Progress Note   Patient: [name] WUJ:[id number] DOB: [birth year] DOA: 10/17/2022     6 DOS: the patient was seen and examined on 10/23/2022   Brief hospital course: 60 yom w/ HTN, moderate aortic stenosis, mitral valve prolapse, atrial fibrillation, hyperlipidemia, OSA not on CPAP, HTN, morbid obesity who erarlier this week developed what he calls a viral type illness w/ shakes and chills which resolved but has become weaker and weaker since and eventually fell,did not hit his head and there was no loss of consciousness.  911 was called and brought to the ED. In the ED: Tmax 100.6.,WBC 22.5, INR 3.8. Chest x-ray cardiomegaly with pulmonary vascular congestion, possibly edema versus infiltrate.  Lactic acid 5.0.  Doing well on room air, blood culture obtained Rocephin azithromycin given, UA unremarkable.  Initial troponin.  Bolus normal saline. Patient was admitted for further management Repeat lactic acid downtrending 3.4, Pro-Cal is up to 2.3 WBC downtrending 20.6. 8/13: Blood culture came back positive for Streptococcus. 8/15: Seen by ID who recommended TEE. 8/16: Seen by ID: continue cefazolin IV   Assessment and Plan:  Sepsis secondary to streptococcal bacteremia and community-acquired pneumonia: Vitals are stable this a.m. Leukocytosis resolved ID recommendations: Continue IV cefazolin 2 g every 8 hours Patient is scheduled for TEE by Dr. Renold Don on August 20 Monitor vitals  Dyspnea, Hypoxia requiring 1-LPM O2 [place], + Crackles on exam, trace LE Edema:  ? CHF with preserved EF Recent CXR: Cardiac enlargement with diffuse interstitial pattern to the lungs likely edema.  Give IV Lasix 20mg   Give incentive spirometry F/u In and out F/u labs in am  Anemia: Chronic, hemoglobin stable at 10.1 this a.m. Follow-up CBC  Hypokalemia: replaced, resolved  Obstructive sleep apnea:  Give incentive spirometry Does not use CPAP at home. Would benefit from CPAP at bedtime.   Chronic atrial  fibrillation: Rate controlled CHA?DS?-VASc of at least 3. INR is therapeutic at 2.6 this a.m. Continue warfarin 5 mg today (per pharmacy) Follow-up PT/INR On clonidine, which may be controlling heart rate.  Nonrheumatic aortic valve stenosis: Continue Coumadin   CAD (coronary artery disease): No acute chest symptoms On warfarin per pharmacy. Continue losartan 100 mg p.o. daily.   HTN (hypertension): Moderately controlled at 146/84 this a.m. Continue ARB, hydrochlorothiazide 12.5 mg p.o. daily, doxazosin 4 mg p.o. twice daily. (Could doxazosin and clonidine switch to beta-blocker?)   Aortic dilatation, Moderate aortic stenosis/mitral regurgitation EF 60-65% no RWMA, small pericardial effusion,  mitral valve is normal aortic valve is normal  mild dilatation of aortic root and ascending aorta no interatrial shunt.    Dyslipidemia Currently not on medical therapy. Follow-up with PCP.  Physical debility/deconditioning: PT therapy on board- last note 3 days ago. Notified RN to get back PT to see him     Subjective: Patient reported mild shortness of breath,  denies cough, fever, chills, chest pain, abdominal symptoms  Reports having generalized weakness, pain in his back which is chronic   Physical Exam: Vitals:   10/22/22 1547 10/22/22 2019 10/23/22 0523 10/23/22 0813  BP: (!) 152/86 (!) 156/89 133/88 (!) 139/97  Pulse: 87 74 85 89  Resp:  18 18 19   Temp: (!) 97.2 F (36.2 C) 98.1 F (36.7 C) 98.2 F (36.8 C) 97.7 F (36.5 C)  TempSrc:  Oral Oral Oral  SpO2: 99% 98% 96% 99%  Weight:      Height:       Physical Exam Constitutional:      Appearance: He  is obese. He is ill-appearing and toxic-appearing.  HENT:     Head: Normocephalic and atraumatic.     Nose: Nose normal. No congestion.     Mouth/Throat:     Mouth: Mucous membranes are moist.     Pharynx: Oropharynx is clear. No oropharyngeal exudate.  Eyes:     Extraocular Movements: Extraocular movements  intact.     Conjunctiva/sclera: Conjunctivae normal.     Pupils: Pupils are equal, round, and reactive to light.  Cardiovascular:     Rate and Rhythm: Normal rate. Rhythm irregular.     Pulses: Normal pulses.     Heart sounds: Murmur heard.  Pulmonary:     Effort: Pulmonary effort is normal.     Breath sounds: Rhonchi and rales present.  Abdominal:     General: Abdomen is flat. Bowel sounds are normal.     Palpations: Abdomen is soft.  Musculoskeletal:     Cervical back: Normal range of motion and neck supple.     Comments: Trace edema in legs b/l  Skin:    General: Skin is warm.     Capillary Refill: Capillary refill takes 2 to 3 seconds.     Comments: Erythema in Rt Lower leg improving, scaling improving  Neurological:     General: No focal deficit present.     Mental Status: He is alert and oriented to person, place, and time.     Cranial Nerves: No cranial nerve deficit.     Sensory: No sensory deficit.  Psychiatric:        Mood and Affect: Mood normal.        Behavior: Behavior normal.     Data Reviewed:  Latest Reference Range & Units 10/23/22 05:55  BASIC METABOLIC PANEL  Rpt !  Sodium 135 - 145 mmol/L 134 (L)  Potassium 3.5 - 5.1 mmol/L 4.9  Chloride 98 - 111 mmol/L 101  CO2 22 - 32 mmol/L 28  Glucose 70 - 99 mg/dL 409 (H)  BUN 8 - 23 mg/dL 16  Creatinine 8.11 - 9.14 mg/dL 7.82  Calcium 8.9 - 95.6 mg/dL 21.3  Anion gap 5 - 15  5  GFR, Estimated >60 mL/min >60  WBC 4.0 - 10.5 K/uL 9.5  RBC 4.22 - 5.81 MIL/uL 3.38 (L)  Hemoglobin 13.0 - 17.0 g/dL 08.6 (L)  HCT 57.8 - 46.9 % 30.9 (L)  MCV 80.0 - 100.0 fL 91.4  MCH 26.0 - 34.0 pg 29.9  MCHC 30.0 - 36.0 g/dL 62.9  RDW 52.8 - 41.3 % 14.5  Platelets 150 - 400 K/uL 291  nRBC 0.0 - 0.2 % 0.0  Prothrombin Time 11.4 - 15.2 seconds 28.4 (H)  INR 0.8 - 1.2  2.6 (H)  !: Data is abnormal (L): Data is abnormally low (H): Data is abnormally high  Family Communication:   Disposition: Status is: Inpatient    Planned Discharge Destination: Home    Time spent: 35 minutes  Author: Ernestene Mention, MD 10/23/2022 10:20 AM  For on call review [email or web address].

## 2022-10-24 DIAGNOSIS — G4733 Obstructive sleep apnea (adult) (pediatric): Secondary | ICD-10-CM

## 2022-10-24 DIAGNOSIS — I4821 Permanent atrial fibrillation: Secondary | ICD-10-CM | POA: Diagnosis not present

## 2022-10-24 DIAGNOSIS — A401 Sepsis due to streptococcus, group B: Secondary | ICD-10-CM | POA: Diagnosis not present

## 2022-10-24 DIAGNOSIS — I10 Essential (primary) hypertension: Secondary | ICD-10-CM | POA: Diagnosis not present

## 2022-10-24 LAB — PROTIME-INR
INR: 2.4 — ABNORMAL HIGH (ref 0.8–1.2)
Prothrombin Time: 26.5 s — ABNORMAL HIGH (ref 11.4–15.2)

## 2022-10-24 MED ORDER — SODIUM CHLORIDE 0.9 % IV SOLN: INTRAVENOUS | Status: DC

## 2022-10-24 MED ORDER — WARFARIN SODIUM 5 MG PO TABS
5.0000 mg | ORAL_TABLET | Freq: Every day | ORAL | Status: DC
  Administered 2022-10-24 – 2022-10-27 (×4): 5 mg via ORAL
  Filled 2022-10-24 (×4): qty 1

## 2022-10-24 NOTE — Progress Notes (Addendum)
Telemetry called because patient had 5 beats of V tach while MD was in room with patient. Spoke with MD face to face in room upon her assessment patient is fine and no further assessment is needed.

## 2022-10-24 NOTE — Progress Notes (Signed)
Progress Note   Patient: [name] WGN:[id number] DOB: 1950/11/08 DOA: 10/17/2022     7 DOS: the patient was seen and examined on 10/24/2022   Brief hospital course: 60 yom w/ HTN, moderate aortic stenosis, mitral valve prolapse, atrial fibrillation, hyperlipidemia, OSA not on CPAP, HTN, morbid obesity who erarlier this week developed what he calls a viral type illness w/ shakes and chills which resolved but has become weaker and weaker since and eventually fell,did not hit his head and there was no loss of consciousness.  911 was called and brought to the ED. In the ED: Tmax 100.6.,WBC 22.5, INR 3.8. Chest x-ray cardiomegaly with pulmonary vascular congestion, possibly edema versus infiltrate.  Lactic acid 5.0.  Doing well on room air, blood culture obtained Rocephin azithromycin given, UA unremarkable.  Initial troponin.  Bolus normal saline. Patient was admitted for further management Repeat lactic acid downtrending 3.4, Pro-Cal is up to 2.3 WBC downtrending 20.6. 8/13: Blood culture came back positive for Streptococcus. 8/15: Seen by ID who recommended TEE. 8/16: Seen by ID: continue cefazolin IV  8/19: Remains stable, TEE tomorrow, repeat cultures from 8/14 remain negative.  Assessment and Plan:  Sepsis secondary to streptococcal bacteremia and community-acquired pneumonia: Vitals are stable this a.m. Leukocytosis resolved ID recommendations: Continue IV cefazolin 2 g every 8 hours Patient is scheduled for TEE by Dr. Renold Don on August 20 Monitor vitals  Dyspnea, Hypoxia .  Improved, now on room air Recent CXR: Cardiac enlargement with diffuse interstitial pattern to the lungs likely edema.   Give incentive spirometry F/u In and out F/u labs in am  Anemia: Chronic, hemoglobin stable. Follow-up CBC  Hypokalemia: replaced, resolved  Obstructive sleep apnea:  Give incentive spirometry Does not use CPAP at home. Would benefit from CPAP at bedtime.   Chronic atrial fibrillation:  Rate controlled CHA?DS?-VASc of at least 3. INR is therapeutic at 2.6 this a.m. Continue warfarin 5 mg today (per pharmacy) Follow-up PT/INR On clonidine, which may be controlling heart rate.  Nonrheumatic aortic valve stenosis: Continue Coumadin   CAD (coronary artery disease): No acute chest symptoms On warfarin per pharmacy. Continue losartan 100 mg p.o. daily.   HTN (hypertension): Mildly elevated blood pressure. Continue ARB, hydrochlorothiazide 12.5 mg p.o. daily, doxazosin 4 mg p.o. twice daily.   Aortic dilatation, Moderate aortic stenosis/mitral regurgitation EF 60-65% no RWMA, small pericardial effusion,  mitral valve is normal aortic valve is normal  mild dilatation of aortic root and ascending aorta no interatrial shunt.    Dyslipidemia Currently not on medical therapy. Follow-up with PCP.  Physical debility/deconditioning:  PT recommending home health    Subjective: Patient was seen and examined today.  Denies any pain or other new complaints.  Physical Exam: Vitals:   10/23/22 1947 10/23/22 2255 10/24/22 0526 10/24/22 0845  BP: (!) 147/86 (!) 150/94 (!) 150/104 (!) 140/86  Pulse: 71 75 (!) 103 91  Resp: 18  18 18   Temp: 98.2 F (36.8 C)  98 F (36.7 C) 98.5 F (36.9 C)  TempSrc: Oral  Oral Oral  SpO2: 97%  97% 98%  Weight:      Height:       General.  Obese gentleman, in no acute distress. Pulmonary.  Lungs clear bilaterally, normal respiratory effort. CV.  Regular rate and rhythm, no JVD, rub or murmur. Abdomen.  Soft, nontender, nondistended, BS positive. CNS.  Alert and oriented .  No focal neurologic deficit. Extremities.  No edema, no cyanosis, pulses intact and symmetrical. Psychiatry.  Judgment and insight appears normal.    Data Reviewed: Prior data reviewed  Family Communication: No family at bedside  Disposition: Status is: Inpatient   Planned Discharge Destination: Home  DVT prophylaxis.  Coumadin Time spent: 40 minutes  This  record has been created using Conservation officer, historic buildings. Errors have been sought and corrected,but may not always be located. Such creation errors do not reflect on the standard of care.   Author: Arnetha Courser, MD 10/24/2022 3:04 PM  For on call review [email or web address].

## 2022-10-24 NOTE — Progress Notes (Signed)
ANTICOAGULATION CONSULT NOTE - Follow Up Consult  Pharmacy Consult for Warfarin Indication: atrial fibrillation  Allergies  Allergen Reactions   Statins Other (See Comments)    "Muscles were messed up"     Patient Measurements: Height: 5\' 10"  (177.8 cm) Weight: 109.9 kg (242 lb 4.6 oz) IBW/kg (Calculated) : 73  Vital Signs: Temp: 98.5 F (36.9 C) (08/19 0845) Temp Source: Oral (08/19 0845) BP: 140/86 (08/19 0845) Pulse Rate: 91 (08/19 0845)  Labs: Recent Labs    10/21/22 0911 10/22/22 0651 10/23/22 0555 10/24/22 0434  HGB 10.4* 10.5* 10.1*  --   HCT 31.1* 31.7* 30.9*  --   PLT 275 281 291  --   LABPROT 29.0* 26.9* 28.4* 26.5*  INR 2.7* 2.5* 2.6* 2.4*  CREATININE 0.76 1.05 1.10  --     Estimated Creatinine Clearance: 75.4 mL/min (by C-G formula based on SCr of 1.1 mg/dL).  Assessment: 72 y/o M presented to the ED on 10/17/22 with generalized weakness. On warfarin PTA for afib. INR supratherapeutic on admit at 3.8; trended down close to therapeutic at 3.3 on 8/13 and warfarin 5 mg x 1 was given (missed dose 8/12 PTA). INR trended back up to 4.4 on 8/14 and doses were held 8/14 and 8/15 when INR remained >3.   Noted DDI with azithromycin received x 1 dose on 8/12 - likely contributed to INR increase.   PTA warfarin regimen: 7.5 mg MWF and 5 mg TTSS - last outpatient INR 2.2 on 7/31   INR therapeutic   Goal of Therapy:  INR 2-3 Monitor platelets by anticoagulation protocol: Yes   Plan:  Warfarin 5 mg daily  INR MWF  Monitor for signs/symptoms of bleeding and DDI.  Thank you Okey Regal, PharmD 10/24/2022 9:04 AM

## 2022-10-24 NOTE — Anesthesia Preprocedure Evaluation (Signed)
Anesthesia Evaluation  Patient identified by MRN, date of birth, ID band Patient awake    Reviewed: Allergy & Precautions, NPO status , Patient's Chart, lab work & pertinent test results  History of Anesthesia Complications Negative for: history of anesthetic complications  Airway Mallampati: III  TM Distance: >3 FB Neck ROM: Full    Dental  (+) Dental Advisory Given, Poor Dentition   Pulmonary neg shortness of breath, sleep apnea (does not use CPAP) , neg COPD, neg recent URI   Pulmonary exam normal breath sounds clear to auscultation       Cardiovascular hypertension, Pt. on medications and Pt. on home beta blockers (-) angina + CAD and + Past MI  + dysrhythmias Atrial Fibrillation + Valvular Problems/Murmurs (mild MR, moderate AS) MR and AS  Rhythm:Regular Rate:Normal + Systolic murmurs HLD  TTE 10/19/2022: IMPRESSIONS    1. Left ventricular ejection fraction, by estimation, is 60 to 65%. The  left ventricle has normal function. The left ventricle has no regional  wall motion abnormalities. There is mild concentric left ventricular  hypertrophy. Left ventricular diastolic  function could not be evaluated. Elevated left atrial pressure.   2. Right ventricular systolic function is normal. The right ventricular  size is normal.   3. A small pericardial effusion is present.   4. The mitral valve is normal in structure. Mild mitral valve  regurgitation. No evidence of mitral stenosis.   5. The aortic valve is normal in structure. Aortic valve regurgitation is  trivial. Moderate aortic valve stenosis.   6. Aortic dilatation noted. There is mild dilatation of the aortic root,  measuring 39 mm. There is mild dilatation of the ascending aorta,  measuring 41 mm.   7. The inferior vena cava is dilated in size with >50% respiratory  variability, suggesting right atrial pressure of 8 mmHg.   R/LHC 12/17/2021:   Prox LAD to Dist  LAD lesion is 55% stenosed.   Mid RCA to Dist RCA lesion is 100% stenosed with 100% stenosed side branch in Acute Mrg.   1st Mrg lesion is 99% stenosed with 70% stenosed side branch in Lat 1st Mrg.   Dist Cx lesion is 70% stenosed with 100% stenosed side branch in 2nd Mrg.   LV end diastolic pressure is moderately elevated.   Hemodynamic findings consistent with mild pulmonary hypertension.   There is mild aortic valve stenosis.    Neuro/Psych neg Seizures    GI/Hepatic negative GI ROS, Neg liver ROS,,,  Endo/Other  Pre-diabetes  Renal/GU negative Renal ROS     Musculoskeletal   Abdominal   Peds  Hematology negative hematology ROS (+)   Anesthesia Other Findings Strep bacteremia    Reproductive/Obstetrics                             Anesthesia Physical Anesthesia Plan  ASA: 3  Anesthesia Plan: MAC   Post-op Pain Management: Minimal or no pain anticipated   Induction:   PONV Risk Score and Plan: 1 and Propofol infusion and Treatment may vary due to age or medical condition  Airway Management Planned: Natural Airway and Nasal Cannula  Additional Equipment:   Intra-op Plan:   Post-operative Plan:   Informed Consent: I have reviewed the patients History and Physical, chart, labs and discussed the procedure including the risks, benefits and alternatives for the proposed anesthesia with the patient or authorized representative who has indicated his/her understanding and acceptance.   Patient  has DNR.  Discussed DNR with patient and Continue DNR.   Dental advisory given  Plan Discussed with: CRNA and Anesthesiologist  Anesthesia Plan Comments: (Discussed with patient risks of MAC including, but not limited to, minor pain or discomfort, hearing people in the room, and possible need for backup general anesthesia. Risks for general anesthesia also discussed including, but not limited to, sore throat, hoarse voice, chipped/damaged  teeth, injury to vocal cords, nausea and vomiting, allergic reactions, lung infection, heart attack, stroke, and death. All questions answered.  Patient okay with medications and intubation if needed for procedure. Does not want chest compressions or shocks.)       Anesthesia Quick Evaluation

## 2022-10-24 NOTE — Progress Notes (Signed)
    [facility] has been requested to perform a transesophageal echocardiogram on [name] for bacteremia. Patient is a 72 year old male with a past medical history of moderate aortic stenosis, mitral valve prolapse, atrial fibrillation, HLD, OSA not on CPAP, HTN, morbid obesity. Patient had presented to the ED on 8/12 complaining of chills, weakness. Blood cultures returned positive for Streptococcus. Patient was seen by ID who started IV antibiotics and recommended TEE.   Echocardiogram 8/14 showed EF 60-65%, no wall motion abnormalities, normal RV function, small pericardial effusion, mild mitral valve regurgitation, moderate aortic valve stenosis.   Discussed with patient and his nephew at bedside.    After careful review of history and examination, the risks and benefits of transesophageal echocardiogram have been explained including risks of esophageal damage, perforation (1:10,000 risk), bleeding, pharyngeal hematoma as well as other potential complications associated with conscious sedation including aspiration, arrhythmia, respiratory failure and death. Alternatives to treatment were discussed, questions were answered. Patient and nephew voiced understanding and are willing to proceed.   Jonita Albee, PA-C 10/24/2022 4:36 PM

## 2022-10-24 NOTE — Progress Notes (Signed)
Physical Therapy Treatment Patient Details Name: [name] MRN: [id number] DOB: 20-Jul-1950 Today's Date: 10/24/2022   History of Present Illness Pt is a 72 y.o. male admitted 10/17/22 with a "viral type illness" resulting in weakness, fatigue and fall calling EMS. Workup for CAP. PMH includes HTN, moderate AS, afib, HLD, OSA, morbid obesity.    PT Comments  Pt greeted resting in bed and agreeable to session with slow progress towards goals as pt limited by increased fatigue, weakness, decreased activity tolerance and impaired balance/postural reactions. Pt requiring min A to come to sitting EOB and power up to stand from elevate EOB to personal RW. Pt able to take a few labored steps to pivot to chair with min A to steady throughout. Pt unable to tolerate standing from chair after transfer for gait trial. Increased time spend discussing status of current mobility with assist needed as pt without adequate assist at home at his current functional level. Pt continues to be adamant about going home vs SNF. Pt continues to benefit from skilled PT services to progress toward functional mobility goals.      If plan is discharge home, recommend the following: A little help with walking and/or transfers;A little help with bathing/dressing/bathroom;Assistance with cooking/housework;Assist for transportation;Help with stairs or ramp for entrance   Can travel by private vehicle        Equipment Recommendations  None recommended by PT    Recommendations for Other Services       Precautions / Restrictions Precautions Precautions: Fall Restrictions Weight Bearing Restrictions: No     Mobility  Bed Mobility Overal bed mobility: Needs Assistance Bed Mobility: Supine to Sit     Supine to sit: Min assist     General bed mobility comments: min A to elevate trunk to sitting    Transfers Overall transfer level: Needs assistance Equipment used: Rolling walker (2 wheels) Transfers: Sit to/from  Stand, Bed to chair/wheelchair/BSC Sit to Stand: Contact guard assist, Min assist Stand pivot transfers: Min assist         General transfer comment: pt keeping RW turned backwar for standing and trasnfer this session as pt with increased weakness and fatigue.  Contstant Min A lifting assist to rise from elevated EOB as pt stating he felt like he was falling forward, slow laborded steps over to recluienr    Ambulation/Gait               General Gait Details: pt unable to tolerate standing again from chair to attempt   Stairs             Wheelchair Mobility     Tilt Bed    Modified Rankin (Stroke Patients Only)       Balance Overall balance assessment: Needs assistance Sitting-balance support: No upper extremity supported, Feet supported, Feet unsupported Sitting balance-Leahy Scale: Fair Sitting balance - Comments: assist to don/doff shoes sitting EOB. Nephew iondep assisting   Standing balance support: During functional activity, Single extremity supported, Bilateral upper extremity supported, Reliant on assistive device for balance Standing balance-Leahy Scale: Poor Standing balance comment: reliant on external supporrt                            Cognition Arousal: Alert Behavior During Therapy: WFL for tasks assessed/performed Overall Cognitive Status: Within Functional Limits for tasks assessed  Exercises      General Comments General comments (skin integrity, edema, etc.): increased WOB during mobility with SpO2 >95% throughout\      Pertinent Vitals/Pain Pain Assessment Pain Assessment: Faces Faces Pain Scale: Hurts even more Pain Location: back, LLE Pain Descriptors / Indicators: Discomfort, Grimacing Pain Intervention(s): Monitored during session, Limited activity within patient's tolerance    Home Living                          Prior Function             PT Goals (current goals can now be found in the care plan section) Acute Rehab PT Goals Patient Stated Goal: return home PT Goal Formulation: With patient Time For Goal Achievement: 11/01/22 Progress towards PT goals: Not progressing toward goals - comment (fatigue/weakness)    Frequency    Min 1X/week      PT Plan      Co-evaluation              AM-PAC PT "6 Clicks" Mobility   Outcome Measure  Help needed turning from your back to your side while in a flat bed without using bedrails?: A Little Help needed moving from lying on your back to sitting on the side of a flat bed without using bedrails?: A Little Help needed moving to and from a bed to a chair (including a wheelchair)?: A Little Help needed standing up from a chair using your arms (e.g., wheelchair or bedside chair)?: A Little Help needed to walk in hospital room?: A Lot Help needed climbing 3-5 steps with a railing? : A Lot 6 Click Score: 16    End of Session   Activity Tolerance: Patient limited by fatigue Patient left: with call bell/phone within reach;with family/visitor present;in chair Nurse Communication: Mobility status PT Visit Diagnosis: Other abnormalities of gait and mobility (R26.89);Muscle weakness (generalized) (M62.81)     Time: 1610-9604 PT Time Calculation (min) (ACUTE ONLY): 31 min  Charges:    $Therapeutic Activity: 23-37 mins PT General Charges $$ ACUTE PT VISIT: 1 Visit                     [name] R. PTA Acute Rehabilitation Services Office: [phone]   [name] 10/24/2022, 4:35 PM

## 2022-10-24 NOTE — Plan of Care (Signed)

## 2022-10-25 ENCOUNTER — Inpatient Hospital Stay (HOSPITAL_COMMUNITY): Payer: PPO

## 2022-10-25 ENCOUNTER — Inpatient Hospital Stay (HOSPITAL_COMMUNITY): Payer: PPO | Admitting: Anesthesiology

## 2022-10-25 ENCOUNTER — Encounter (HOSPITAL_COMMUNITY): Payer: Self-pay | Admitting: Family Medicine

## 2022-10-25 ENCOUNTER — Encounter (HOSPITAL_COMMUNITY)
Admission: EM | Disposition: A | Discharge status: Skilled Nursing Facility | Payer: Self-pay | Source: Home / Self Care | Attending: Internal Medicine

## 2022-10-25 DIAGNOSIS — I350 Nonrheumatic aortic (valve) stenosis: Secondary | ICD-10-CM | POA: Diagnosis not present

## 2022-10-25 DIAGNOSIS — I10 Essential (primary) hypertension: Secondary | ICD-10-CM | POA: Diagnosis not present

## 2022-10-25 DIAGNOSIS — R7881 Bacteremia: Secondary | ICD-10-CM

## 2022-10-25 DIAGNOSIS — J90 Pleural effusion, not elsewhere classified: Secondary | ICD-10-CM | POA: Diagnosis not present

## 2022-10-25 DIAGNOSIS — G4733 Obstructive sleep apnea (adult) (pediatric): Secondary | ICD-10-CM | POA: Diagnosis not present

## 2022-10-25 DIAGNOSIS — I340 Nonrheumatic mitral (valve) insufficiency: Secondary | ICD-10-CM | POA: Diagnosis not present

## 2022-10-25 DIAGNOSIS — I4821 Permanent atrial fibrillation: Secondary | ICD-10-CM | POA: Diagnosis not present

## 2022-10-25 DIAGNOSIS — A401 Sepsis due to streptococcus, group B: Secondary | ICD-10-CM | POA: Diagnosis not present

## 2022-10-25 HISTORY — PX: TEE WITHOUT CARDIOVERSION: SHX5443

## 2022-10-25 LAB — ECHO TEE
AR max vel: 1.57 cm2
AV Area VTI: 1.46 cm2
AV Area mean vel: 1.64 cm2
AV Mean grad: 28 mmHg
AV Peak grad: 48.4 mmHg
Ao pk vel: 3.48 m/s
MV M vel: 5.45 m/s
MV Peak grad: 118.8 mmHg
MV VTI: 3.37 cm2
P 1/2 time: 636 ms
Radius: 0.4 cm

## 2022-10-25 LAB — BASIC METABOLIC PANEL
Anion gap: 8 (ref 5–15)
BUN: 22 mg/dL (ref 8–23)
CO2: 26 mmol/L (ref 22–32)
Calcium: 10.3 mg/dL (ref 8.9–10.3)
Chloride: 100 mmol/L (ref 98–111)
Creatinine, Ser: 0.88 mg/dL (ref 0.61–1.24)
GFR, Estimated: >60 mL/min (ref >60)
Glucose, Bld: 105 mg/dL — ABNORMAL HIGH (ref 70–99)
Potassium: 4 mmol/L (ref 3.5–5.1)
Sodium: 134 mmol/L — ABNORMAL LOW (ref 135–145)

## 2022-10-25 LAB — CULTURE, BLOOD (ROUTINE X 2)
Culture: NO GROWTH
Culture: NO GROWTH
Special Requests: ADEQUATE
Special Requests: ADEQUATE

## 2022-10-25 LAB — CBC
HCT: 32.2 % — ABNORMAL LOW (ref 39.0–52.0)
Hemoglobin: 10.4 g/dL — ABNORMAL LOW (ref 13.0–17.0)
MCH: 29.5 pg (ref 26.0–34.0)
MCHC: 32.3 g/dL (ref 30.0–36.0)
MCV: 91.5 fL (ref 80.0–100.0)
Platelets: 330 10*3/uL (ref 150–400)
RBC: 3.52 MIL/uL — ABNORMAL LOW (ref 4.22–5.81)
RDW: 14.5 % (ref 11.5–15.5)
WBC: 7.4 10*3/uL (ref 4.0–10.5)
nRBC: 0 % (ref 0.0–0.2)

## 2022-10-25 LAB — MAGNESIUM: Magnesium: 2.3 mg/dL (ref 1.7–2.4)

## 2022-10-25 SURGERY — ECHOCARDIOGRAM, TRANSESOPHAGEAL
Anesthesia: Monitor Anesthesia Care

## 2022-10-25 MED ORDER — PROPOFOL 10 MG/ML IV BOLUS
INTRAVENOUS | Status: DC | PRN
  Administered 2022-10-25: 50 mg via INTRAVENOUS

## 2022-10-25 MED ORDER — PROPOFOL 500 MG/50ML IV EMUL
INTRAVENOUS | Status: DC | PRN
  Administered 2022-10-25: 75 ug/kg/min via INTRAVENOUS

## 2022-10-25 MED ORDER — PHENYLEPHRINE HCL (PRESSORS) 10 MG/ML IV SOLN
INTRAVENOUS | Status: DC | PRN
  Administered 2022-10-25 (×3): 80 ug via INTRAVENOUS

## 2022-10-25 MED ORDER — LIDOCAINE 2% (20 MG/ML) 5 ML SYRINGE
INTRAMUSCULAR | Status: DC | PRN
  Administered 2022-10-25: 20 mg via INTRAVENOUS

## 2022-10-25 NOTE — Transfer of Care (Signed)
Immediate Anesthesia Transfer of Care Note  Patient: [name]  Procedure(s) Performed: TRANSESOPHAGEAL ECHOCARDIOGRAM  Patient Location: PACU and Cath Lab  Anesthesia Type:MAC  Level of Consciousness: awake, alert , oriented, patient cooperative, and responds to stimulation  Airway & Oxygen Therapy: Patient Spontanous Breathing and Patient connected to nasal cannula oxygen  Post-op Assessment: Report given to RN, Post -op Vital signs reviewed and stable, and Patient moving all extremities  Post vital signs: Reviewed and stable  Last Vitals:  Vitals Value Taken Time  BP 96/80 0810  Temp 98.30F 0810  Pulse 75 0810  Resp 16 0810  SpO2 98% 0810    Last Pain:  Vitals:   10/25/22 0500  TempSrc: Oral  PainSc:          Complications: No notable events documented.  *Report given to Cath Lab RN for continued care and followup.

## 2022-10-25 NOTE — Progress Notes (Signed)
Physical Therapy Treatment Patient Details Name: [name] MRN: [id number] DOB: 1950/09/06 Today's Date: 10/25/2022   History of Present Illness Pt is a 72 y.o. male admitted 10/17/22 with a "viral type illness" resulting in weakness, fatigue and fall calling EMS. Workup for CAP. PMH includes HTN, moderate AS, afib, HLD, OSA, morbid obesity.    PT Comments  Pt greeted up in chair requesting this PTAs assist to transfer back to bed. Pt declining gait trials before transfer as pt reporting continued fatigue. Pt needing mod A and increased time to come to stand from recliner with RW for support, with pt needing increased cues for sequencing, hand placement, and technique. Pt needing mod A +2 and RW for support to step pivot recliner>EOB as pt with strongly flexed posture and increased difficulty advancing BLEs, as well as assist needed to guide hips around to sit. Increased time spent discussing disposition and updated recommendation as pt from home alone and without adequate assist and will benefit from continued inpatient follow up therapy, <3 hours/day. Pt continues to benefit from skilled PT services to progress toward functional mobility goals.      If plan is discharge home, recommend the following: A little help with walking and/or transfers;A little help with bathing/dressing/bathroom;Assistance with cooking/housework;Assist for transportation;Help with stairs or ramp for entrance   Can travel by private vehicle     No  Equipment Recommendations  None recommended by PT    Recommendations for Other Services       Precautions / Restrictions Precautions Precautions: Fall Restrictions Weight Bearing Restrictions: No     Mobility  Bed Mobility Overal bed mobility: Needs Assistance Bed Mobility: Sit to Supine     Supine to sit: Mod assist Sit to supine: Mod assist   General bed mobility comments: mod A to return LEs to bed    Transfers Overall transfer level: Needs  assistance Equipment used: Rolling walker (2 wheels) Transfers: Sit to/from Stand, Bed to chair/wheelchair/BSC Sit to Stand: Mod assist Stand pivot transfers: Mod assist         General transfer comment: pt keeping RW turned backward for standing and trasnfer mod A to rise from reclienr wtih increased time needed before transfer to scoot out to edge of chair. pt with strong anterior flexion in standing, mod A needed to guide hips and maintain standing balance as pt with increased R lateral lean    Ambulation/Gait               General Gait Details: pt unable to tolerate this session   Stairs             Wheelchair Mobility     Tilt Bed    Modified Rankin (Stroke Patients Only)       Balance Overall balance assessment: Needs assistance Sitting-balance support: No upper extremity supported, Feet supported, Feet unsupported Sitting balance-Leahy Scale: Fair Sitting balance - Comments: assist to don/doff shoes sitting EOB. Nephew iondep assisting   Standing balance support: During functional activity, Single extremity supported, Bilateral upper extremity supported, Reliant on assistive device for balance Standing balance-Leahy Scale: Poor Standing balance comment: reliant on external supporrt                            Cognition Arousal: Alert Behavior During Therapy: WFL for tasks assessed/performed Overall Cognitive Status: Within Functional Limits for tasks assessed  General Comments: pt with decreased insight into deficits increased time spent in discussion of HH vs SNF with pt in agreement that he is functioning well below baseline and unable to care for self at home        Exercises General Exercises - Lower Extremity Quad Sets: AROM, Left, 15 reps, Supine Heel Slides: AROM, Left, 15 reps, Supine    General Comments General comments (skin integrity, edema, etc.): pt nephew present at bedside  and supportive, increased time spent in discussion on SNF vs HH with pt in agreement to persuing post acute rehab      Pertinent Vitals/Pain Pain Assessment Pain Assessment: Faces Faces Pain Scale: Hurts little more Pain Location: LLE Pain Descriptors / Indicators: Sore, Tightness, Grimacing Pain Intervention(s): Monitored during session, Limited activity within patient's tolerance, Repositioned    Home Living                          Prior Function            PT Goals (current goals can now be found in the care plan section) Acute Rehab PT Goals Patient Stated Goal: return home PT Goal Formulation: With patient Time For Goal Achievement: 11/01/22 Progress towards PT goals: Progressing toward goals (slowly)    Frequency    Min 1X/week      PT Plan      Co-evaluation              AM-PAC PT "6 Clicks" Mobility   Outcome Measure  Help needed turning from your back to your side while in a flat bed without using bedrails?: A Little Help needed moving from lying on your back to sitting on the side of a flat bed without using bedrails?: A Little Help needed moving to and from a bed to a chair (including a wheelchair)?: A Lot Help needed standing up from a chair using your arms (e.g., wheelchair or bedside chair)?: A Lot Help needed to walk in hospital room?: A Lot Help needed climbing 3-5 steps with a railing? : A Lot 6 Click Score: 14    End of Session   Activity Tolerance: Patient limited by fatigue Patient left: with call bell/phone within reach;with family/visitor present;in bed Nurse Communication: Mobility status PT Visit Diagnosis: Other abnormalities of gait and mobility (R26.89);Muscle weakness (generalized) (M62.81)     Time: 7829-5621 PT Time Calculation (min) (ACUTE ONLY): 13 min  Charges:    $Therapeutic Activity: 8-22 mins PT General Charges $$ ACUTE PT VISIT: 1 Visit                     Mayla Biddy R. PTA Acute Rehabilitation  Services Office: [phone]   Catalina Antigua 10/25/2022, 2:13 PM

## 2022-10-25 NOTE — CV Procedure (Addendum)
Brief TEE Note  LVEF 60-65% 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 No LA/LAA thrombus or masses No evidence of endocarditis Trivial pericardial effusion Small pleural effusion  For additional details see full report.  Triva Hueber C. Duke Salvia, MD, [facility] 10/25/2022 8:11 AM

## 2022-10-25 NOTE — Anesthesia Postprocedure Evaluation (Signed)
Anesthesia Post Note  Patient: [name]  Procedure(s) Performed: TRANSESOPHAGEAL ECHOCARDIOGRAM     Patient location during evaluation: PACU Anesthesia Type: MAC Level of consciousness: awake Pain management: pain level controlled Vital Signs Assessment: post-procedure vital signs reviewed and stable Respiratory status: spontaneous breathing, nonlabored ventilation and respiratory function stable Cardiovascular status: stable and blood pressure returned to baseline Postop Assessment: no apparent nausea or vomiting Anesthetic complications: no   No notable events documented.  Last Vitals:  Vitals:   10/25/22 0820 10/25/22 0830  BP: 116/69 127/77  Pulse: 69 65  Resp: (!) 27 14  Temp:  36.7 C  SpO2: 95% 96%    Last Pain:  Vitals:   10/25/22 0830  TempSrc: Temporal  PainSc: 0-No pain                 [name]

## 2022-10-25 NOTE — Progress Notes (Signed)
  Echocardiogram Echocardiogram Transesophageal has been performed.  Janalyn Harder 10/25/2022, 8:23 AM

## 2022-10-25 NOTE — H&P (Signed)
[name] is a 72 y.o. male who has presented today for surgery, with the diagnosis of bacteremia.  The various methods of treatment have been discussed with the patient and family. After consideration of risks, benefits and other options for treatment, the patient has consented to  Procedure(s): TRANSESOPHAGEAL ECHOCARDIOGRAM (TEE) (N/A) as a surgical intervention .  The patient's history has been reviewed, patient examined, no change in status, stable for surgery.  I have reviewed the patient's chart and labs.  Questions were answered to the patient's satisfaction.    Jaley Yan C. Duke Salvia, MD, [facility]  10/25/2022 7:39 AM

## 2022-10-25 NOTE — Progress Notes (Signed)
Physical Therapy Treatment Patient Details Name: [name] MRN: [id number] DOB: 1950/11/04 Today's Date: 10/25/2022   History of Present Illness Pt is a 72 y.o. male admitted 10/17/22 with a "viral type illness" resulting in weakness, fatigue and fall calling EMS. Workup for CAP. PMH includes HTN, moderate AS, afib, HLD, OSA, morbid obesity.    PT Comments  Pt greeted up in chair on arrival and agreeable to session with slow but steady progress towards acute goals. Pt needing increased assist this session for all mobility s/p TEE procedure in AM. Pt requiring mod A to come to sitting EOB, min A to transfer to stand and up to mod A to step pivot to recliner with RW for support. Pt needing increased cues for during transfer for sequencing and RW management with pt demonstrating strong anterior trunk flexion throughout transfer with pt unable to correct. Pt declining further gait trials due to fatigue and requesting time to rest up in recliner before further attempts. Pt continues to be limited by gross weakness, decreased activity tolerance and impaired balance/postural reactions. Pt continues to benefit from skilled PT services to progress toward functional mobility goals.      If plan is discharge home, recommend the following: A little help with walking and/or transfers;A little help with bathing/dressing/bathroom;Assistance with cooking/housework;Assist for transportation;Help with stairs or ramp for entrance   Can travel by private vehicle        Equipment Recommendations  None recommended by PT    Recommendations for Other Services       Precautions / Restrictions Precautions Precautions: Fall Restrictions Weight Bearing Restrictions: No     Mobility  Bed Mobility Overal bed mobility: Needs Assistance Bed Mobility: Supine to Sit     Supine to sit: Mod assist     General bed mobility comments: mod A to bring LEs to and off EOB, scoot out to EOB with bed pad and elevate  trunk to sitting    Transfers Overall transfer level: Needs assistance Equipment used: Rolling walker (2 wheels) Transfers: Sit to/from Stand, Bed to chair/wheelchair/BSC Sit to Stand: Min assist Stand pivot transfers: Mod assist         General transfer comment: pt keeping RW turned backward for standing and trasnfer this session as pt with increased weakness and fatigue.  Contstant Min A lifting assist to rise from elevated EOB with slow laborded steps over to recliner    Ambulation/Gait               General Gait Details: pt unable to tolerate standing again from chair to attempt   Stairs             Wheelchair Mobility     Tilt Bed    Modified Rankin (Stroke Patients Only)       Balance Overall balance assessment: Needs assistance Sitting-balance support: No upper extremity supported, Feet supported, Feet unsupported Sitting balance-Leahy Scale: Fair Sitting balance - Comments: assist to don/doff shoes sitting EOB. Nephew iondep assisting   Standing balance support: During functional activity, Single extremity supported, Bilateral upper extremity supported, Reliant on assistive device for balance Standing balance-Leahy Scale: Poor Standing balance comment: reliant on external supporrt                            Cognition Arousal: Alert Behavior During Therapy: WFL for tasks assessed/performed Overall Cognitive Status: Within Functional Limits for tasks assessed  General Comments: pt with decreased insight into deficits        Exercises      General Comments        Pertinent Vitals/Pain Pain Assessment Pain Assessment: Faces Faces Pain Scale: Hurts a little bit Pain Location: generalized Pain Descriptors / Indicators: Discomfort, Grimacing Pain Intervention(s): Monitored during session, Limited activity within patient's tolerance, Repositioned    Home Living                           Prior Function            PT Goals (current goals can now be found in the care plan section) Acute Rehab PT Goals Patient Stated Goal: return home PT Goal Formulation: With patient Time For Goal Achievement: 11/01/22 Progress towards PT goals: Progressing toward goals (slowly)    Frequency    Min 1X/week      PT Plan      Co-evaluation              AM-PAC PT "6 Clicks" Mobility   Outcome Measure  Help needed turning from your back to your side while in a flat bed without using bedrails?: A Little Help needed moving from lying on your back to sitting on the side of a flat bed without using bedrails?: A Little Help needed moving to and from a bed to a chair (including a wheelchair)?: A Little Help needed standing up from a chair using your arms (e.g., wheelchair or bedside chair)?: A Lot Help needed to walk in hospital room?: A Lot Help needed climbing 3-5 steps with a railing? : A Lot 6 Click Score: 15    End of Session   Activity Tolerance: Patient limited by fatigue Patient left: with call bell/phone within reach;with family/visitor present;in chair Nurse Communication: Mobility status PT Visit Diagnosis: Other abnormalities of gait and mobility (R26.89);Muscle weakness (generalized) (M62.81)     Time: 4270-6237 PT Time Calculation (min) (ACUTE ONLY): 17 min  Charges:    $Therapeutic Activity: 8-22 mins PT General Charges $$ ACUTE PT VISIT: 1 Visit                     Nusrat Encarnacion R. PTA Acute Rehabilitation Services Office: [phone]   Catalina Antigua 10/25/2022, 2:02 PM

## 2022-10-25 NOTE — Addendum Note (Signed)
Addendum  created 10/25/22 1411 by Earlene Plater, CRNA   Flowsheet accepted

## 2022-10-25 NOTE — Plan of Care (Signed)

## 2022-10-25 NOTE — Addendum Note (Signed)
Addendum  created 10/25/22 0927 by Earlene Plater, CRNA   Flowsheet accepted

## 2022-10-25 NOTE — Progress Notes (Signed)
Progress Note   Patient: [name]:[id number] DOB: 08-Apr-1950 DOA: 10/17/2022     8 DOS: the patient was seen and examined on 10/25/2022   Brief hospital course: 52 yom w/ HTN, moderate aortic stenosis, mitral valve prolapse, atrial fibrillation, hyperlipidemia, OSA not on CPAP, HTN, morbid obesity who erarlier this week developed what he calls a viral type illness w/ shakes and chills which resolved but has become weaker and weaker since and eventually fell,did not hit his head and there was no loss of consciousness.  911 was called and brought to the ED. In the ED: Tmax 100.6.,WBC 22.5, INR 3.8. Chest x-ray cardiomegaly with pulmonary vascular congestion, possibly edema versus infiltrate.  Lactic acid 5.0.  Doing well on room air, blood culture obtained Rocephin azithromycin given, UA unremarkable.  Initial troponin.  Bolus normal saline. Patient was admitted for further management Repeat lactic acid downtrending 3.4, Pro-Cal is up to 2.3 WBC downtrending 20.6. 8/13: Blood culture came back positive for Streptococcus. 8/15: Seen by ID who recommended TEE. 8/16: Seen by ID: continue cefazolin IV  8/19: Remains stable, TEE tomorrow, repeat cultures from 8/14 remain negative. 8/20: TEE negative for endocarditis, ID order panoramic view for teeth to rule out any underlying abscess as a potential source of this infection, patient will need 2 weeks of antibiotics which can be done as p.o.  Also will need outpatient dental evaluation.  Patient initially declining rehab but now seems interested as he cannot take care of himself.  Assessment and Plan:  Sepsis secondary to streptococcal bacteremia and community-acquired pneumonia: Vitals are stable this a.m. Leukocytosis resolved.  TTE and TEE negative for endocarditis ID recommendations: Continue IV cefazolin 2 g every 8 hours Monitor vitals ID ordered imaging of teeth.  Will need outpatient dental evaluation. ID is not recommending 2 weeks of  antibiotic which can be done as p.o.  Dyspnea, Hypoxia .  Improved, now on room air Recent CXR: Cardiac enlargement with diffuse interstitial pattern to the lungs likely edema.   Give incentive spirometry  Anemia: Chronic, hemoglobin stable. Follow-up CBC  Hypokalemia: replaced, resolved  Obstructive sleep apnea:  Give incentive spirometry Does not use CPAP at home. Would benefit from CPAP at bedtime.   Chronic atrial fibrillation: Rate controlled CHA?DS?-VASc of at least 3. INR is therapeutic at 2.6 this a.m. Continue warfarin 5 mg today (per pharmacy) Follow-up PT/INR On clonidine, which may be controlling heart rate.  Nonrheumatic aortic valve stenosis: Continue Coumadin   CAD (coronary artery disease): No acute chest symptoms On warfarin per pharmacy. Continue losartan 100 mg p.o. daily.   HTN (hypertension): Mildly elevated blood pressure. Continue ARB, hydrochlorothiazide 12.5 mg p.o. daily, doxazosin 4 mg p.o. twice daily.   Aortic dilatation, Moderate aortic stenosis/mitral regurgitation EF 60-65% no RWMA, small pericardial effusion,  mitral valve is normal aortic valve is normal  mild dilatation of aortic root and ascending aorta no interatrial shunt.    Dyslipidemia Currently not on medical therapy. Follow-up with PCP.  Physical debility/deconditioning:  PT recommended home health as patient was declining rehab.  Unable to take care of himself.  Likely will need rehab.   Subjective: Patient continued to feel very weak and unable to take care of himself.  Although he does not wanted but agreed to consider rehab.  Physical Exam: Vitals:   10/25/22 0818 10/25/22 0820 10/25/22 0830 10/25/22 0905  BP: 103/67 116/69 127/77 132/77  Pulse: 78 69 65 79  Resp: (!) 23 (!) 27 14 19  Temp: 98 F (36.7 C)  98 F (36.7 C) (!) 97.3 F (36.3 C)  TempSrc: Temporal  Temporal Oral  SpO2: 95% 95% 96% 96%  Weight:      Height:       General.  Obese elderly man, in no  acute distress. Pulmonary.  Lungs clear bilaterally, normal respiratory effort. CV.  Regular rate and rhythm, no JVD, rub or murmur. Abdomen.  Soft, nontender, nondistended, BS positive. CNS.  Alert and oriented .  No focal neurologic deficit. Extremities.  No edema, no cyanosis, pulses intact and symmetrical. Psychiatry.  Judgment and insight appears normal.    Data Reviewed: Prior data reviewed  Family Communication: Discussed with nephew at bedside  Disposition: Status is: Inpatient   Planned Discharge Destination: SNF DVT prophylaxis.  Coumadin Time spent: 42 minutes  This record has been created using Conservation officer, historic buildings. Errors have been sought and corrected,but may not always be located. Such creation errors do not reflect on the standard of care.   Author: Arnetha Courser, MD 10/25/2022 12:35 PM  For on call review [email or web address].

## 2022-10-26 ENCOUNTER — Encounter (HOSPITAL_COMMUNITY): Payer: Self-pay | Admitting: Cardiovascular Disease

## 2022-10-26 DIAGNOSIS — R6520 Severe sepsis without septic shock: Secondary | ICD-10-CM | POA: Diagnosis not present

## 2022-10-26 DIAGNOSIS — I77819 Aortic ectasia, unspecified site: Secondary | ICD-10-CM | POA: Diagnosis not present

## 2022-10-26 DIAGNOSIS — A401 Sepsis due to streptococcus, group B: Secondary | ICD-10-CM | POA: Diagnosis not present

## 2022-10-26 DIAGNOSIS — E785 Hyperlipidemia, unspecified: Secondary | ICD-10-CM

## 2022-10-26 DIAGNOSIS — I4821 Permanent atrial fibrillation: Secondary | ICD-10-CM | POA: Diagnosis not present

## 2022-10-26 DIAGNOSIS — R7881 Bacteremia: Secondary | ICD-10-CM

## 2022-10-26 DIAGNOSIS — J189 Pneumonia, unspecified organism: Secondary | ICD-10-CM | POA: Diagnosis not present

## 2022-10-26 DIAGNOSIS — N179 Acute kidney failure, unspecified: Secondary | ICD-10-CM | POA: Diagnosis not present

## 2022-10-26 LAB — CBC WITH DIFFERENTIAL/PLATELET
Abs Immature Granulocytes: 0.16 10*3/uL — ABNORMAL HIGH (ref 0.00–0.07)
Basophils Absolute: 0 10*3/uL (ref 0.0–0.1)
Basophils Relative: 1 %
Eosinophils Absolute: 0.2 10*3/uL (ref 0.0–0.5)
Eosinophils Relative: 2 %
HCT: 33.3 % — ABNORMAL LOW (ref 39.0–52.0)
Hemoglobin: 10.8 g/dL — ABNORMAL LOW (ref 13.0–17.0)
Immature Granulocytes: 2 %
Lymphocytes Relative: 19 %
Lymphs Abs: 1.5 10*3/uL (ref 0.7–4.0)
MCH: 29.8 pg (ref 26.0–34.0)
MCHC: 32.4 g/dL (ref 30.0–36.0)
MCV: 92 fL (ref 80.0–100.0)
Monocytes Absolute: 0.7 10*3/uL (ref 0.1–1.0)
Monocytes Relative: 8 %
Neutro Abs: 5.7 10*3/uL (ref 1.7–7.7)
Neutrophils Relative %: 68 %
Platelets: 332 10*3/uL (ref 150–400)
RBC: 3.62 MIL/uL — ABNORMAL LOW (ref 4.22–5.81)
RDW: 14.4 % (ref 11.5–15.5)
WBC: 8.3 10*3/uL (ref 4.0–10.5)
nRBC: 0 % (ref 0.0–0.2)

## 2022-10-26 LAB — COMPREHENSIVE METABOLIC PANEL
ALT: 7 U/L (ref 0–44)
AST: 17 U/L (ref 15–41)
Albumin: 2.4 g/dL — ABNORMAL LOW (ref 3.5–5.0)
Alkaline Phosphatase: 81 U/L (ref 38–126)
Anion gap: 8 (ref 5–15)
BUN: 19 mg/dL (ref 8–23)
CO2: 25 mmol/L (ref 22–32)
Calcium: 10.2 mg/dL (ref 8.9–10.3)
Chloride: 100 mmol/L (ref 98–111)
Creatinine, Ser: 0.73 mg/dL (ref 0.61–1.24)
GFR, Estimated: >60 mL/min (ref >60)
Glucose, Bld: 104 mg/dL — ABNORMAL HIGH (ref 70–99)
Potassium: 4.3 mmol/L (ref 3.5–5.1)
Sodium: 133 mmol/L — ABNORMAL LOW (ref 135–145)
Total Bilirubin: 0.4 mg/dL (ref 0.3–1.2)
Total Protein: 5.9 g/dL — ABNORMAL LOW (ref 6.5–8.1)

## 2022-10-26 LAB — PROTIME-INR
INR: 2.6 — ABNORMAL HIGH (ref 0.8–1.2)
Prothrombin Time: 27.9 s — ABNORMAL HIGH (ref 11.4–15.2)

## 2022-10-26 LAB — PHOSPHORUS: Phosphorus: 2.9 mg/dL (ref 2.5–4.6)

## 2022-10-26 LAB — MAGNESIUM: Magnesium: 2.2 mg/dL (ref 1.7–2.4)

## 2022-10-26 MED ORDER — AMOXICILLIN 500 MG PO CAPS
1000.0000 mg | ORAL_CAPSULE | Freq: Three times a day (TID) | ORAL | Status: DC
  Administered 2022-10-26 – 2022-10-30 (×12): 1000 mg via ORAL
  Filled 2022-10-26 (×12): qty 2

## 2022-10-26 NOTE — [place] FL2 (Signed)
[place] MEDICAID FL2 LEVEL OF CARE FORM     IDENTIFICATION  Patient Name: [name] Birthdate: [birth year] Sex: male Admission Date (Current Location): 10/17/2022  [employer] and [place] Number:  Producer, television/film/video and Address:  [facility], [address], [place], [place] [address]      Provider Number: [id number]  Attending Physician Name and Address:  Merlene Laughter, DO  Relative Name and Phone Number:       Current Level of Care: Hospital Recommended Level of Care: Skilled Nursing Facility Prior Approval Number:    Date Approved/Denied:   PASRR Number: [id number]  Discharge Plan: SNF    Current Diagnoses: Patient Active Problem List   Diagnosis Date Noted   Bacteremia 10/25/2022   Aortic dilatation (HCC) 10/20/2022   Sepsis due to Streptococcus, group B (HCC) 10/19/2022   CAP (community acquired pneumonia) 10/17/2022   HTN (hypertension) 10/17/2022   CAD (coronary artery disease) 10/17/2022   Pneumonia 10/17/2022   Essential hypertension 08/15/2022   Dizziness 08/11/2022   Non-ST elevation (NSTEMI) myocardial infarction [facility])    Elevated troponin 12/15/2021   Cellulitis of abdominal wall 12/15/2021   Generalized weakness 12/15/2021   Stasis ulcer (HCC) 07/15/2021   Nonrheumatic mitral valve regurgitation 01/14/2021   Nonrheumatic aortic valve stenosis 01/14/2021   Bradycardia 01/14/2021   Hypercalcemia 09/26/2020   Permanent atrial fibrillation (HCC)    Prediabetes 07/21/2016   Myalgia and myositis 10/17/2013   Right hip pain 10/17/2013   Unspecified vitamin D deficiency 10/11/2013   Encounter for therapeutic drug monitoring 06/03/2013   OTHER CONSTIPATION 01/20/2010   PERSONAL HX COLONIC POLYPS 01/20/2010   OBSTRUCTIVE SLEEP APNEA 11/17/2008   ATRIAL FIBRILLATION 08/18/2008   Dyslipidemia 06/18/2007   OBESITY 06/18/2007   INTERNAL HEMORRHOIDS 06/18/2007   EXTERNAL HEMORRHOIDS 06/18/2007   Resistant hypertension  09/04/2006   MITRAL REGURGITATION, MODERATE 09/04/2006   RENAL CALCULUS, HX OF 09/04/2006   ADENOMATOUS COLONIC POLYP 07/29/2003    Orientation RESPIRATION BLADDER Height & Weight     Time, Situation, Place, Self  Normal External catheter Weight: 109 kg Height:  5\' 10"  (177.8 cm)  BEHAVIORAL SYMPTOMS/MOOD NEUROLOGICAL BOWEL NUTRITION STATUS      Continent Diet (See discharge summary)  AMBULATORY STATUS COMMUNICATION OF NEEDS Skin   Limited Assist Verbally Other (Comment) (RLE Dry Squamation - daily dressing orders)                       Personal Care Assistance Level of Assistance  Bathing, Feeding, Dressing Bathing Assistance: Limited assistance Feeding assistance: Independent Dressing Assistance: Limited assistance     Functional Limitations Info  Sight, Hearing, Speech Sight Info: Impaired Hearing Info: Adequate Speech Info: Adequate    SPECIAL CARE FACTORS FREQUENCY  PT (By licensed PT), OT (By licensed OT)     PT Frequency: 5 x per week OT Frequency: 5 x per week            Contractures Contractures Info: Not present    Additional Factors Info  Code Status, Allergies Code Status Info: DNR Allergies Info: Statins           Current Medications (10/26/2022):  This is the current hospital active medication list Current Facility-Administered Medications  Medication Dose Route Frequency Provider Last Rate Last Admin   acetaminophen (TYLENOL) tablet 650 mg  650 mg Oral Q6H PRN Chilton Si, MD       Or   acetaminophen (TYLENOL) suppository 650 mg  650 mg Rectal Q6H PRN Chilton Si, MD       albuterol (PROVENTIL) (2.5 MG/3ML) 0.083% nebulizer solution 2.5 mg  2.5 mg Nebulization Q2H PRN Chilton Si, MD   2.5 mg at 10/18/22 1653   ammonium lactate (LAC-HYDRIN) 12 % lotion   Topical BID Chilton Si, MD   Given at 10/25/22 2224   carvedilol (COREG) tablet 12.5 mg  12.5 mg Oral BID WC Chilton Si, MD   12.5 mg at 10/26/22 0852    ceFAZolin (ANCEF) IVPB 2g/100 mL premix  2 g Intravenous Q8H Chilton Si, MD 200 mL/hr at 10/26/22 6578 2 g at 10/26/22 4696   cloNIDine (CATAPRES) tablet 0.2 mg  0.2 mg Oral BID Chilton Si, MD   0.2 mg at 10/25/22 2220   diphenhydrAMINE (BENADRYL) injection 25 mg  25 mg Intravenous Once PRN Chilton Si, MD       doxazosin (CARDURA) tablet 4 mg  4 mg Oral Daily Chilton Si, MD   4 mg at 10/25/22 1050   EPINEPHrine (EPI-PEN) injection 0.3 mg  0.3 mg Intramuscular Once PRN Chilton Si, MD       hydrochlorothiazide (HYDRODIURIL) tablet 12.5 mg  12.5 mg Oral Daily Chilton Si, MD   12.5 mg at 10/25/22 1047   leptospermum manuka honey (MEDIHONEY) paste 1 Application  1 Application Topical Daily Chilton Si, MD   1 Application at 10/22/22 0918   losartan (COZAAR) tablet 100 mg  100 mg Oral Daily Chilton Si, MD   100 mg at 10/25/22 1048   lubiprostone (AMITIZA) capsule 24 mcg  24 mcg Oral BID Chilton Si, MD   24 mcg at 10/25/22 2220   magnesium oxide (MAG-OX) tablet 400 mg  400 mg Oral BID Chilton Si, MD   400 mg at 10/25/22 2220   omega-3 acid ethyl esters (LOVAZA) capsule 1 g  1 g Oral Daily Chilton Si, MD   1 g at 10/25/22 1050   potassium chloride SA (KLOR-CON M) CR tablet 20 mEq  20 mEq Oral Daily Chilton Si, MD   20 mEq at 10/25/22 1048   senna-docusate (Senokot-S) tablet 1 tablet  1 tablet Oral QHS PRN Chilton Si, MD   1 tablet at 10/19/22 1804   warfarin (COUMADIN) tablet 5 mg  5 mg Oral q1600 Chilton Si, MD   5 mg at 10/25/22 1658   Warfarin - Pharmacist Dosing Inpatient   Does not apply q1600 Chilton Si, MD   Given at 10/24/22 1700     Discharge Medications: Please see discharge summary for a list of discharge medications.  Relevant Imaging Results:  Relevant Lab Results:   Additional Information SSN [id number]  Janae Bridgeman, RN

## 2022-10-26 NOTE — Progress Notes (Signed)
PROGRESS NOTE    [name]  ZOX:[id number] DOB: 03-13-1950 DOA: 10/17/2022 PCP: Blake Salisbury, MD   Brief Narrative:  The patient is a 72 year old Caucasian male with a past medical history significant for but not to HTN, moderate aortic stenosis, mitral valve prolapse, atrial fibrillation, hyperlipidemia, OSA not on CPAP, Obesity as well as other comorbidities who earlier in the week developed what he does describes as a viral illness with shakes and chills but then subsequently resolved.  He has not become weaker and weaker since and eventually fell but did not hit his head and there is no loss of consciousness.  Given his fall he is brought to the ED after he called 911 and in the ED he was found to be febrile.  Chest x-ray was done and showed cardiomegaly with pulmonary vascular congestion and possible edema versus infiltrate.  Lactic acid level was elevated at 5 and he was given IV Rocephin and azithromycin.  He is admitted for further workup and lactic acid started downtrending and WBC is now resolved but blood cultures came back positive for Streptococcus.  He is now being treated for streptococcal bacteremia and he was placed on IV Cefazolin and further workup reveals likely the source of the infection is suspected to be dental with peridental disease with dental caries and multiple erosions.  His IV cefazolin has now been changed to oral amoxicillin to bridge maxillofacial evaluation and possible dental extraction.  Given his deconditioning PT OT have evaluated and recommending SNF and he is now agreeable but was at first hesitant to go to rehab.  Assessment and Plan:  Sepsis Secondary to Streptococcal bacteremia and community-acquired pneumonia:  -Vitals are stable this a.m. -Leukocytosis resolved.  TTE and TEE negative for endocarditis -ID recommendations: Continue IV cefazolin 2 g every 8 hours but now changing to oral Amoxicillin 1000 mg po q8h -Continue Monitor Vitals -ID ordered  imaging of teeth and it showed "Periodontal disease and alveolar resorption with exposure of the roots of the maxillary and mandibular dentition. Superimposed dental caries with multiple erosions within the residual maxillary molars. No periapical lucency identified." -ID also pending referral to [name] for evaluation of his dental caries and anticipating extractions and likely will need a maxillofacial surgeon -PT OT recommending SNF   Dyspnea and Hypoxia in the setting of Streptococcal PNA -Improved, now on room air -Recent CXR: Cardiac enlargement with diffuse interstitial pattern to the lungs likely edema.   SpO2: 97 % O2 Flow Rate (L/min): 2 L/min -Give incentive spirometry -Continuous Pulse Oximetry and Maintain O2 Sat's greater than 90% -Continue Supplemental O2 via [name] and Wean O2 as Tolerated -Will need an Ambulatory Home O2 Screen prior to D/C and repeat CXR in the AM    Normocytic Anemia -Chronic. Hgb/Hct Trend: Recent Labs  Lab 10/19/22 0734 10/20/22 0157 10/21/22 0911 10/22/22 0651 10/23/22 0555 10/25/22 0431 10/26/22 0537  HGB 10.7* 10.5* 10.4* 10.5* 10.1* 10.4* 10.8*  HCT 32.1* 31.1* 31.1* 31.7* 30.9* 32.2* 33.3*  MCV 89.4 90.7 88.9 89.3 91.4 91.5 92.0  -Check Anemia Panel in the AM -Continue to Monitor for S/Sx of Bleeding; No overt Bleeding noted -Repeat CBC in the AM  Hypokalemia, improved -Patient's K+ Level Trend: Recent Labs  Lab 10/19/22 0734 10/20/22 0157 10/21/22 0911 10/22/22 0651 10/23/22 0555 10/25/22 0431 10/26/22 0537  K 3.6 3.5 3.0* 4.4 4.9 4.0 4.3  -Continue to Monitor and Replete as Necessary -Repeat CMP in the AM    Obstructive Sleep Apnea -Give  incentive spirometry -Does not use CPAP at home. -Would benefit from CPAP at bedtime.   Chronic Atrial Fibrillation -Rate controlled -CHA?DS?-VASc of at least 3. -INR is therapeutic at 2.6 this a.m. -Continue warfarin 5 mg today (per pharmacy) -Follow-up PT/INR -On Carvedilol 12.5 mg po  BID -Continue to Monitor on Telemetry  Hyponatremia -In the setting of Hydrochlorothiazide use -Will Hold and Discontinue Hydrochlorothiazide -Na+ Trend:  Recent Labs  Lab 10/19/22 0734 10/20/22 0157 10/21/22 0911 10/22/22 0651 10/23/22 0555 10/25/22 0431 10/26/22 0537  NA 135 132* 135 137 134* 134* 133*  -Continue to Monitor and Trend and repeat CMP in the AM   Nonrheumatic aortic valve stenosis -Continue Coumadin   CAD (coronary artery disease) -No acute chest symptoms -On warfarin per pharmacy. -Continue Losartan 100 mg p.o. daily and Carvedilol 12.5 mg po BID   HTN (Hypertension) -Mildly elevated blood pressure. -Continue ARB with Losartan 100 mg po Daily, Clonidine 0.2 mg po BID, Carvedilol 12.5 mg po BID, and Doxazosin 4 mg po Daily  -Will Discontinue Hydrochlorothiazide 12.5 mg p.o. daily due to Hyponatremia   Aortic Dilatation Moderate aortic stenosis Mitral regurgitation -EF 60-65% no RWMA, small pericardial effusion,  -Mitral valve is normal and Aortic valve is normal  -Mild dilatation of aortic root and ascending aorta no interatrial shunt.  -Needs Cardiology Follow up   Dyslipidemia -Currently not on medical therapy except Omega-3 Acid Ethyl Esters 1 gram po Daily -Follow-up with PCP.   Physical Debility/Generalized Deconditioning:  -PT recommended home health as patient was declining rehab.   -Unable to take care of himself and Likely will need rehab and now PT/OT recommending SNF and Son and Patient in agreement  Hypoalbuminemia -Patient's Albumin Trend: Recent Labs  Lab 10/17/22 1929 10/26/22 0537  ALBUMIN 2.9* 2.4*  -Continue to Monitor and Trend and repeat CMP in the AM  Obesity -Complicates overall prognosis and care -Estimated body mass index is 34.48 kg/m as calculated from the following:   Height as of this encounter: 5\' 10"  (1.778 m).   Weight as of this encounter: 109 kg.  -Weight Loss and Dietary Counseling given   DVT  prophylaxis:  warfarin (COUMADIN) tablet 5 mg    Code Status: DNR Family Communication: Discussed with son at bedside  Disposition Plan:  Level of care: Telemetry Medical Status is: Inpatient Remains inpatient appropriate because: His further clinical improvement and anticipating discharging to SNF   Consultants:  Infectious Diseases Cardiology for TEE  Procedures:  TRANSTHORACIC ECHOCARDIOGRAM IMPRESSIONS     1. Left ventricular ejection fraction, by estimation, is 60 to 65%. The  left ventricle has normal function. The left ventricle has no regional  wall motion abnormalities. There is mild concentric left ventricular  hypertrophy. Left ventricular diastolic  function could not be evaluated. Elevated left atrial pressure.   2. Right ventricular systolic function is normal. The right ventricular  size is normal.   3. A small pericardial effusion is present.   4. The mitral valve is normal in structure. Mild mitral valve  regurgitation. No evidence of mitral stenosis.   5. The aortic valve is normal in structure. Aortic valve regurgitation is  trivial. Moderate aortic valve stenosis.   6. Aortic dilatation noted. There is mild dilatation of the aortic root,  measuring 39 mm. There is mild dilatation of the ascending aorta,  measuring 41 mm.   7. The inferior vena cava is dilated in size with >50% respiratory  variability, suggesting right atrial pressure of 8 mmHg.  FINDINGS   Left Ventricle: Left ventricular ejection fraction, by estimation, is 60  to 65%. The left ventricle has normal function. The left ventricle has no  regional wall motion abnormalities. The left ventricular internal cavity  size was normal in size. There is   mild concentric left ventricular hypertrophy. Left ventricular diastolic  function could not be evaluated due to atrial fibrillation. Left  ventricular diastolic function could not be evaluated. Elevated left  atrial pressure.   Right  Ventricle: The right ventricular size is normal. Right ventricular  systolic function is normal.   Left Atrium: Left atrial size was normal in size.   Right Atrium: Right atrial size was normal in size.   Pericardium: A small pericardial effusion is present.   Mitral Valve: The mitral valve is normal in structure. Mild mitral valve  regurgitation. No evidence of mitral valve stenosis. MV peak gradient, 7.1  mmHg. The mean mitral valve gradient is 4.0 mmHg.   Tricuspid Valve: The tricuspid valve is normal in structure. Tricuspid  valve regurgitation is trivial. No evidence of tricuspid stenosis.   Aortic Valve: The aortic valve is normal in structure. Aortic valve  regurgitation is trivial. Aortic regurgitation PHT measures 684 msec.  Moderate aortic stenosis is present. Aortic valve mean gradient measures  24.0 mmHg. Aortic valve peak gradient  measures 44.7 mmHg. Aortic valve area, by VTI measures 1.49 cm.   Pulmonic Valve: The pulmonic valve was normal in structure. Pulmonic valve  regurgitation is trivial. No evidence of pulmonic stenosis.   Aorta: Aortic dilatation noted. There is mild dilatation of the aortic  root, measuring 39 mm. There is mild dilatation of the ascending aorta,  measuring 41 mm.   Venous: The inferior vena cava is dilated in size with greater than 50%  respiratory variability, suggesting right atrial pressure of 8 mmHg.   IAS/Shunts: No atrial level shunt detected by color flow Doppler.     LEFT VENTRICLE  PLAX 2D  LVIDd:         5.10 cm     Diastology  LVIDs:         3.60 cm     LV e' medial:    7.84 cm/s  LV PW:         1.00 cm     LV E/e' medial:  17.0  LV IVS:        1.10 cm     LV e' lateral:   8.33 cm/s  LVOT diam:     1.90 cm     LV E/e' lateral: 16.0  LV SV:         93  LV SV Index:   41  LVOT Area:     2.84 cm    LV Volumes (MOD)  LV vol d, MOD A2C: 94.2 ml  LV vol d, MOD A4C: 95.3 ml  LV vol s, MOD A2C: 43.1 ml  LV vol s, MOD A4C:  42.5 ml  LV SV MOD A2C:     51.1 ml  LV SV MOD A4C:     95.3 ml  LV SV MOD BP:      54.4 ml   RIGHT VENTRICLE             IVC  RV Basal diam:  4.00 cm     IVC diam: 2.80 cm  RV S prime:     12.47 cm/s  TAPSE (M-mode): 2.2 cm   LEFT ATRIUM  Index        RIGHT ATRIUM           Index  LA diam:        4.30 cm 1.90 cm/m   RA Area:     20.90 cm  LA Vol (A2C):   77.1 ml 34.05 ml/m  RA Volume:   56.40 ml  24.91 ml/m  LA Vol (A4C):   69.0 ml 30.47 ml/m  LA Biplane Vol: 73.9 ml 32.63 ml/m   AORTIC VALVE  AV Area (Vmax):    1.40 cm  AV Area (Vmean):   1.52 cm  AV Area (VTI):     1.49 cm  AV Vmax:           334.25 cm/s  AV Vmean:          230.500 cm/s  AV VTI:            0.625 m  AV Peak Grad:      44.7 mmHg  AV Mean Grad:      24.0 mmHg  LVOT Vmax:         165.00 cm/s  LVOT Vmean:        123.667 cm/s  LVOT VTI:          0.328 m  LVOT/AV VTI ratio: 0.52  AI PHT:            684 msec    AORTA  Ao Root diam: 3.90 cm  Ao Asc diam:  4.10 cm   MITRAL VALVE                TRICUSPID VALVE  MV Area (PHT): 4.22 cm     TR Peak grad:   29.8 mmHg  MV Area VTI:   4.02 cm     TR Vmax:        273.00 cm/s  MV Peak grad:  7.1 mmHg  MV Mean grad:  4.0 mmHg     SHUNTS  MV Vmax:       1.33 m/s     Systemic VTI:  0.33 m  MV Vmean:      93.0 cm/s    Systemic Diam: 1.90 cm  MV Decel Time: 180 msec  MV E velocity: 133.67 cm/s   TRANSESOPHAGEAL ECHOCARDIOGRAM    Brief TEE Note   LVEF 60-65% 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 No LA/LAA thrombus or masses No evidence of endocarditis Trivial pericardial effusion Small pleural effusion       Antimicrobials:  Anti-infectives (From admission, onward)    Start     Dose/Rate Route Frequency Ordered Stop   10/26/22 1400  amoxicillin (AMOXIL) capsule 1,000 mg        1,000 mg Oral Every 8 hours 10/26/22 1136     10/19/22 1400  ceFAZolin  (ANCEF) IVPB 2g/100 mL premix  Status:  Discontinued        2 g 200 mL/hr over 30 Minutes Intravenous Every 8 hours 10/18/22 1441 10/26/22 1136   10/19/22 1100  amoxicillin (AMOXIL) capsule 500 mg        500 mg Oral  Once 10/19/22 1014 10/19/22 1225   10/18/22 1400  cefTRIAXone (ROCEPHIN) 2 g in sodium chloride 0.9 % 100 mL IVPB  Status:  Discontinued        2 g 200 mL/hr over 30 Minutes Intravenous Every 24 hours 10/18/22 1054 10/18/22 1441   10/17/22 2045  azithromycin (ZITHROMAX)  500 mg in sodium chloride 0.9 % 250 mL IVPB  Status:  Discontinued        500 mg 250 mL/hr over 60 Minutes Intravenous Every 24 hours 10/17/22 2039 10/18/22 1441   10/17/22 2045  cefTRIAXone (ROCEPHIN) 1 g in sodium chloride 0.9 % 100 mL IVPB  Status:  Discontinued        1 g 200 mL/hr over 30 Minutes Intravenous Every 24 hours 10/17/22 2039 10/18/22 1054       Subjective: Seen and examined at bedside and feels that he is extremely weak now.  Also states that he is agreeable to going to SNF and does not "want to burden his son".  No nausea or vomiting.  Feels okay.  Willing to get teeth extractions now.  No other concerns or complaints at this time.  Objective: Vitals:   10/26/22 0100 10/26/22 0557 10/26/22 0600 10/26/22 0736  BP: 109/65 (!) 139/112  128/86  Pulse: 75 67  82  Resp: 18 18  19   Temp: 98 F (36.7 C) 98 F (36.7 C)  98.1 F (36.7 C)  TempSrc: Oral Oral  Oral  SpO2: 96% 99%  97%  Weight:   109 kg   Height:        Intake/Output Summary (Last 24 hours) at 10/26/2022 1702 Last data filed at 10/26/2022 1207 Gross per 24 hour  Intake 118 ml  Output 1700 ml  Net -1582 ml   Filed Weights   10/18/22 0448 10/26/22 0600  Weight: 109.9 kg 109 kg   Examination: Physical Exam:  Constitutional: WN/WD obese chronically ill-appearing Caucasian male in no acute distress appears fatigued Respiratory: Diminished to auscultation bilaterally with coarse breath sounds, no wheezing, rales, rhonchi or  crackles. Normal respiratory effort and patient is not tachypenic. No accessory muscle use.  Unlabored breathing Cardiovascular: RRR, no murmurs / rubs / gallops. S1 and S2 auscultated.  Mild extremity edema Abdomen: Soft, non-tender, distended secondary to body habitus. Bowel sounds positive.  GU: Deferred. Musculoskeletal: No clubbing / cyanosis of digits/nails. No joint deformity upper and lower extremities.  Skin: Has a slight rash on his right leg compared to his left Neurologic: CN 2-12 grossly intact with no focal deficits. Romberg sign and cerebellar reflexes not assessed.  Psychiatric: Normal judgment and insight. Alert and oriented x 3. Normal mood and appropriate affect.   Data Reviewed: I have personally reviewed following labs and imaging studies  CBC: Recent Labs  Lab 10/21/22 0911 10/22/22 0651 10/23/22 0555 10/25/22 0431 10/26/22 0537  WBC 7.6 8.7 9.5 7.4 8.3  NEUTROABS  --   --   --   --  5.7  HGB 10.4* 10.5* 10.1* 10.4* 10.8*  HCT 31.1* 31.7* 30.9* 32.2* 33.3*  MCV 88.9 89.3 91.4 91.5 92.0  PLT 275 281 291 330 332   Basic Metabolic Panel: Recent Labs  Lab 10/21/22 0907 10/21/22 0911 10/22/22 0651 10/23/22 0555 10/25/22 0431 10/26/22 0537  NA  --  135 137 134* 134* 133*  K  --  3.0* 4.4 4.9 4.0 4.3  CL  --  102 105 101 100 100  CO2  --  23 25 28 26 25   GLUCOSE  --  120* 112* 123* 105* 104*  BUN  --  19 16 16 22 19   CREATININE  --  0.76 1.05 1.10 0.88 0.73  CALCIUM  --  9.9 10.0 10.2 10.3 10.2  MG 1.9  --   --   --  2.3 2.2  PHOS 1.5*  --   --   --   --  2.9   GFR: Estimated Creatinine Clearance: 103.2 mL/min (by C-G formula based on SCr of 0.73 mg/dL). Liver Function Tests: Recent Labs  Lab 10/26/22 0537  AST 17  ALT 7  ALKPHOS 81  BILITOT 0.4  PROT 5.9*  ALBUMIN 2.4*   No results for input(s): "LIPASE", "AMYLASE" in the last 168 hours. No results for input(s): "AMMONIA" in the last 168 hours. Coagulation Profile: Recent Labs  Lab  10/21/22 0911 10/22/22 0651 10/23/22 0555 10/24/22 0434 10/26/22 0537  INR 2.7* 2.5* 2.6* 2.4* 2.6*   Cardiac Enzymes: No results for input(s): "CKTOTAL", "CKMB", "CKMBINDEX", "TROPONINI" in the last 168 hours. BNP (last 3 results) No results for input(s): "PROBNP" in the last 8760 hours. HbA1C: No results for input(s): "HGBA1C" in the last 72 hours. CBG: Recent Labs  Lab 10/19/22 2012 10/23/22 0811  GLUCAP 139* 164*   Lipid Profile: No results for input(s): "CHOL", "HDL", "LDLCALC", "TRIG", "CHOLHDL", "LDLDIRECT" in the last 72 hours. Thyroid Function Tests: No results for input(s): "TSH", "T4TOTAL", "FREET4", "T3FREE", "THYROIDAB" in the last 72 hours. Anemia Panel: No results for input(s): "VITAMINB12", "FOLATE", "FERRITIN", "TIBC", "IRON", "RETICCTPCT" in the last 72 hours. Sepsis Labs: No results for input(s): "PROCALCITON", "LATICACIDVEN" in the last 168 hours.  Recent Results (from the past 240 hour(s))  SARS Coronavirus 2 by RT PCR (hospital order, performed in [facility] hospital lab) *cepheid single result test* Anterior Nasal Swab     Status: None   Collection Time: 10/17/22  7:29 PM   Specimen: Anterior Nasal Swab  Result Value Ref Range Status   SARS Coronavirus 2 by RT PCR NEGATIVE NEGATIVE Final    Comment: Performed at [facility], [address], [place], [place] [address]  Culture, blood (Routine X 2) w Reflex to ID Panel     Status: Abnormal   Collection Time: 10/17/22  8:15 PM   Specimen: BLOOD  Result Value Ref Range Status   Specimen Description BLOOD LEFT ANTECUBITAL  Final   Special Requests   Final    BOTTLES DRAWN AEROBIC AND ANAEROBIC Blood Culture adequate volume   Culture  Setup Time   Final    GRAM POSITIVE COCCI IN CHAINS AEROBIC BOTTLE ONLY Organism ID to follow CRITICAL RESULT CALLED TO, READ BACK BY AND VERIFIED WITH: Joellen Jersey PHARMD, AT 1331 10/18/22 Renato Shin Performed at [facility]: [facility], [address] [place],  [place] [address]    Culture GROUP B STREP(S.AGALACTIAE)ISOLATED (A)  Final   Report Status 10/20/2022 FINAL  Final   Organism ID, Bacteria GROUP B STREP(S.AGALACTIAE)ISOLATED  Final      Susceptibility   Group b strep(s.agalactiae)isolated - MIC*    CLINDAMYCIN RESISTANT Resistant     AMPICILLIN <=0.25 SENSITIVE Sensitive     ERYTHROMYCIN 2 RESISTANT Resistant     VANCOMYCIN 0.5 SENSITIVE Sensitive     CEFTRIAXONE <=0.12 SENSITIVE Sensitive     LEVOFLOXACIN 1 SENSITIVE Sensitive     PENICILLIN <=0.06 SENSITIVE Sensitive     * GROUP B STREP(S.AGALACTIAE)ISOLATED  Blood Culture ID Panel (Reflexed)     Status: Abnormal   Collection Time: 10/17/22  8:15 PM  Result Value Ref Range Status   Enterococcus faecalis NOT DETECTED NOT DETECTED Final   Enterococcus Faecium NOT DETECTED NOT DETECTED Final   Listeria monocytogenes NOT DETECTED NOT DETECTED Final   Staphylococcus species NOT DETECTED NOT DETECTED Final   Staphylococcus aureus (BCID) NOT DETECTED NOT DETECTED Final   Staphylococcus epidermidis NOT DETECTED NOT DETECTED Final  Staphylococcus lugdunensis NOT DETECTED NOT DETECTED Final   Streptococcus species DETECTED (A) NOT DETECTED Final    Comment: CRITICAL RESULT CALLED TO, READ BACK BY AND VERIFIED WITH: S. MOODY PHARMD, AT 1331 10/18/22 D. VANHOOK    Streptococcus agalactiae DETECTED (A) NOT DETECTED Final    Comment: CRITICAL RESULT CALLED TO, READ BACK BY AND VERIFIED WITH: S. MOODY PHARMD, AT 1331 10/18/22 D. VANHOOK    Streptococcus pneumoniae NOT DETECTED NOT DETECTED Final   Streptococcus pyogenes NOT DETECTED NOT DETECTED Final   A.calcoaceticus-baumannii NOT DETECTED NOT DETECTED Final   Bacteroides fragilis NOT DETECTED NOT DETECTED Final   Enterobacterales NOT DETECTED NOT DETECTED Final   Enterobacter cloacae complex NOT DETECTED NOT DETECTED Final   Escherichia coli NOT DETECTED NOT DETECTED Final   Klebsiella aerogenes NOT DETECTED NOT DETECTED Final   Klebsiella  oxytoca NOT DETECTED NOT DETECTED Final   Klebsiella pneumoniae NOT DETECTED NOT DETECTED Final   Proteus species NOT DETECTED NOT DETECTED Final   Salmonella species NOT DETECTED NOT DETECTED Final   Serratia marcescens NOT DETECTED NOT DETECTED Final   Haemophilus influenzae NOT DETECTED NOT DETECTED Final   Neisseria meningitidis NOT DETECTED NOT DETECTED Final   Pseudomonas aeruginosa NOT DETECTED NOT DETECTED Final   Stenotrophomonas maltophilia NOT DETECTED NOT DETECTED Final   Candida albicans NOT DETECTED NOT DETECTED Final   Candida auris NOT DETECTED NOT DETECTED Final   Candida glabrata NOT DETECTED NOT DETECTED Final   Candida krusei NOT DETECTED NOT DETECTED Final   Candida parapsilosis NOT DETECTED NOT DETECTED Final   Candida tropicalis NOT DETECTED NOT DETECTED Final   Cryptococcus neoformans/gattii NOT DETECTED NOT DETECTED Final    Comment: Performed at [facility], [address], [place], [place] [address]  Culture, blood (Routine X 2) w Reflex to ID Panel     Status: None   Collection Time: 10/20/22 11:52 AM   Specimen: BLOOD LEFT ARM  Result Value Ref Range Status   Specimen Description BLOOD LEFT ARM  Final   Special Requests   Final    BOTTLES DRAWN AEROBIC AND ANAEROBIC Blood Culture adequate volume   Culture   Final    NO GROWTH 5 DAYS Performed at [facility], [address] [place], [place] [address]    Report Status 10/25/2022 FINAL  Final  Culture, blood (Routine X 2) w Reflex to ID Panel     Status: None   Collection Time: 10/20/22 11:52 AM   Specimen: BLOOD LEFT HAND  Result Value Ref Range Status   Specimen Description BLOOD LEFT HAND  Final   Special Requests   Final    BOTTLES DRAWN AEROBIC AND ANAEROBIC Blood Culture adequate volume   Culture   Final    NO GROWTH 5 DAYS Performed at [facility], [address], [place], [place] [address]    Report Status 10/25/2022 FINAL  Final    Radiology Studies: CT MAXILLOFACIAL  WO CONTRAST  Result Date: 10/26/2022 CLINICAL DATA:  Bacteremia EXAM: CT MAXILLOFACIAL WITHOUT CONTRAST TECHNIQUE: Multidetector CT imaging of the maxillofacial structures was performed. Multiplanar CT image reconstructions were also generated. RADIATION DOSE REDUCTION: This exam was performed according to the departmental dose-optimization program which includes automated exposure control, adjustment of the mA and/or kV according to patient size and/or use of iterative reconstruction technique. COMPARISON:  None Available. FINDINGS: Osseous: The facial bones are intact. No mandibular dislocation. There are numerous absent maxillary teeth. The posterior right maxillary  premolar appears eroded. There is periodontal disease in maxillary resorption noted with exposure of the roots of the maxillary dentition. Superimposed dental caries noted with multiple erosions within the mid residual maxillary molars. Periodontal disease and maxillary resorption similarly exposes the roots of the mandibular dentition. No periapical lucency identified. Orbits: Negative. No traumatic or inflammatory finding. Sinuses: Clear. Soft tissues: Negative. Limited intracranial: No significant or unexpected finding. IMPRESSION: 1. Periodontal disease and alveolar resorption with exposure of the roots of the maxillary and mandibular dentition. Superimposed dental caries with multiple erosions within the residual maxillary molars. No periapical lucency identified. Electronically Signed   By: Helyn Numbers M.D.   On: 10/26/2022 00:42   ECHO TEE  Result Date: 10/25/2022    TRANSESOPHOGEAL ECHO REPORT   Patient Name:   [name] Date of Exam: 10/25/2022 Medical Rec #:  [id number]    Height:       70.0 in Accession #:    [id number]   Weight:       242.3 lb Date of Birth:  04-06-1950    BSA:          2.265 m Patient Age:    71 years     BP:           143/83 mmHg Patient Gender: M            HR:           74 bpm. Exam Location:  Inpatient  Procedure: 3D Echo, Transesophageal Echo, Cardiac Doppler and Color Doppler Indications:     Bacteremia.  History:         Patient has prior history of Echocardiogram examinations, most                  recent 10/19/2022. CAD, Abnormal ECG, Aortic Valve Disease and                  Mitral Valve Disease, Arrythmias:Atrial Fibrillation and                  Bradycardia, Signs/Symptoms:Chest Pain and                  Dizziness/Lightheadedness; Risk Factors:Hypertension and Sleep                  Apnea. Aortic stenosis.  Sonographer:     Sheralyn Boatman RDCS Referring Phys:  [id number] Jonita Albee Diagnosing Phys: Chilton Si MD PROCEDURE: After discussion of the risks and benefits of a TEE, an informed consent was obtained from the patient. The transesophogeal probe was passed without difficulty through the esophogus of the patient. Sedation performed by different physician. The patient was monitored while under deep sedation. Anesthestetic sedation was provided intravenously by Anesthesiology: 198mg  of Propofol, 20mg  of Lidocaine. The patient's vital signs; including heart rate, blood pressure, and oxygen saturation; remained stable throughout the procedure. The patient developed no complications during the procedure.  IMPRESSIONS  1. Left ventricular ejection fraction, by estimation, is 55 to 60%. The left ventricle has normal function. The left ventricle has no regional wall motion abnormalities.  2. Right ventricular systolic function is normal. The right ventricular size is normal.  3. No left atrial/left atrial appendage thrombus was detected. The LAA emptying velocity was 35 cm/s.  4. A small pericardial effusion is present.  5. The mitral valve is myxomatous. Mild mitral valve regurgitation. No evidence of mitral stenosis. There is mild prolapse of the medial segment of the anterior leaflet of  the mitral valve. Moderate mitral annular calcification.  6. Fusion of the left and non-coronary cusps. The aortic  valve is calcified. Aortic valve regurgitation is mild to moderate. Moderate aortic valve stenosis. Aortic valve area, by VTI measures 1.46 cm. Aortic valve mean gradient measures 28.0 mmHg. Aortic valve Vmax measures 3.48 m/s.  7. Aortic dilatation noted. There is mild dilatation of the ascending aorta, measuring 42 mm.  8. The inferior vena cava is normal in size with greater than 50% respiratory variability, suggesting right atrial pressure of 3 mmHg. Conclusion(s)/Recommendation(s): Normal biventricular function without evidence of hemodynamically significant valvular heart disease. No evidence of vegetation/infective endocarditis on this transesophageael echocardiogram. FINDINGS  Left Ventricle: Left ventricular ejection fraction, by estimation, is 55 to 60%. The left ventricle has normal function. The left ventricle has no regional wall motion abnormalities. The left ventricular internal cavity size was normal in size. There is  no left ventricular hypertrophy. Right Ventricle: The right ventricular size is normal. No increase in right ventricular wall thickness. Right ventricular systolic function is normal. Left Atrium: Left atrial size was normal in size. No left atrial/left atrial appendage thrombus was detected. The LAA emptying velocity was 35 cm/s. Right Atrium: Right atrial size was normal in size. Pericardium: A small pericardial effusion is present. Mitral Valve: The mitral valve is myxomatous. There is mild prolapse of the medial segment of the anterior leaflet of the mitral valve. There is mild thickening of the mitral valve leaflet(s). Moderate mitral annular calcification. Mild mitral valve regurgitation. No evidence of mitral valve stenosis. MV peak gradient, 8.5 mmHg. The mean mitral valve gradient is 2.0 mmHg. Tricuspid Valve: The tricuspid valve is normal in structure. Tricuspid valve regurgitation is mild . No evidence of tricuspid stenosis. Aortic Valve: Fusion of the left and non-coronary  cusps. The aortic valve is calcified. Aortic valve regurgitation is mild to moderate. Aortic regurgitation PHT measures 636 msec. Moderate aortic stenosis is present. Aortic valve mean gradient measures 28.0 mmHg. Aortic valve peak gradient measures 48.4 mmHg. Aortic valve area, by VTI measures 1.46 cm. Pulmonic Valve: The pulmonic valve was normal in structure. Pulmonic valve regurgitation is mild. No evidence of pulmonic stenosis. Aorta: Aortic dilatation noted. There is mild dilatation of the ascending aorta, measuring 42 mm. Venous: The inferior vena cava is normal in size with greater than 50% respiratory variability, suggesting right atrial pressure of 3 mmHg. IAS/Shunts: No atrial level shunt detected by color flow Doppler. Additional Comments: Spectral Doppler performed. LEFT VENTRICLE PLAX 2D LVOT diam:     2.50 cm LV SV:         115 LV SV Index:   51 LVOT Area:     4.91 cm  AORTIC VALVE AV Area (Vmax):    1.57 cm AV Area (Vmean):   1.64 cm AV Area (VTI):     1.46 cm AV Vmax:           348.00 cm/s AV Vmean:          229.667 cm/s AV VTI:            0.786 m AV Peak Grad:      48.4 mmHg AV Mean Grad:      28.0 mmHg LVOT Vmax:         111.00 cm/s LVOT Vmean:        76.700 cm/s LVOT VTI:          0.234 m LVOT/AV VTI ratio: 0.30 AI PHT:  636 msec  AORTA Ao Asc diam: 4.20 cm MITRAL VALVE MV Area VTI:  3.37 cm        SHUNTS MV Peak grad: 8.5 mmHg        Systemic VTI:  0.23 m MV Mean grad: 2.0 mmHg        Systemic Diam: 2.50 cm MV Vmax:      1.46 m/s MV Vmean:     60.7 cm/s MR Peak grad:    118.8 mmHg MR Mean grad:    79.0 mmHg MR Vmax:         545.00 cm/s MR Vmean:        428.0 cm/s MR PISA:         1.01 cm MR PISA Eff ROA: 8 mm MR PISA Radius:  0.40 cm Chilton Si MD Electronically signed by Chilton Si MD Signature Date/Time: 10/25/2022/8:30:04 AM    Final    EP STUDY  Result Date: 10/25/2022 See surgical note for result.   Scheduled Meds:  ammonium lactate   Topical BID    amoxicillin  1,000 mg Oral Q8H   carvedilol  12.5 mg Oral BID WC   cloNIDine  0.2 mg Oral BID   doxazosin  4 mg Oral Daily   hydrochlorothiazide  12.5 mg Oral Daily   leptospermum manuka honey  1 Application Topical Daily   losartan  100 mg Oral Daily   lubiprostone  24 mcg Oral BID   magnesium oxide  400 mg Oral BID   omega-3 acid ethyl esters  1 g Oral Daily   potassium chloride  20 mEq Oral Daily   warfarin  5 mg Oral q1600   Warfarin - Pharmacist Dosing Inpatient   Does not apply q1600   Continuous Infusions:   LOS: 9 days   Marguerita Merles, DO [facility] Available via Epic secure chat 7am-7pm After these hours, please refer to coverage provider listed on [email or web address] 10/26/2022, 5:02 PM

## 2022-10-26 NOTE — Plan of Care (Signed)

## 2022-10-26 NOTE — Progress Notes (Signed)
Occupational Therapy Treatment Patient Details Name: [name] MRN: [id number] DOB: [birth year] Today's Date: 10/26/2022   History of present illness Pt is a 72 y.o. male admitted 10/17/22 with a "viral type illness" resulting in weakness, fatigue and fall calling EMS. Workup for CAP. PMH includes HTN, moderate AS, afib, HLD, OSA, morbid obesity.   OT comments  Continued education in regard to realistic expectations with therapy and progression of mobility as pt continues with decr mood state. Continues to believe he will never go home as stated in previous note and needing max education regarding focusing on what he can do as well as collaboration on goals of care. Double checked with RN for palliative consult for pt to better understand his situation, pain management, and be able to direct his care. Motivated to participate in therapy this afternoon. Therapeutic exercise/activity focused on balance, strength, and posture. See exercises below. 3 STS with CGA and side steps toward [facility] for RW management. Updated discharge disposition <3 hours/day.       If plan is discharge home, recommend the following:  A little help with walking and/or transfers;A lot of help with bathing/dressing/bathroom;Assist for transportation;Help with stairs or ramp for entrance;Assistance with cooking/housework   Equipment Recommendations  None recommended by OT    Recommendations for Other Services      Precautions / Restrictions Precautions Precautions: Fall Restrictions Weight Bearing Restrictions: No       Mobility Bed Mobility Overal bed mobility: Needs Assistance Bed Mobility: Supine to Sit, Sit to Supine     Supine to sit: Mod assist Sit to supine: Mod assist   General bed mobility comments: Mod A for truncal elevation and scooting hips out to EOB. Mod A to bring BLE into bed as well    Transfers Overall transfer level: Needs assistance Equipment used: Rolling walker (2 wheels) Transfers:  Sit to/from Stand Sit to Stand: Contact guard assist           General transfer comment: Signficiantly increased time to rise and cues for hand placement     Balance Overall balance assessment: Needs assistance Sitting-balance support: No upper extremity supported, Feet supported, Feet unsupported Sitting balance-Leahy Scale: Fair Sitting balance - Comments: assist to don/doff shoes sitting EOB. Nephew iondep assisting   Standing balance support: During functional activity, Single extremity supported, Bilateral upper extremity supported, Reliant on assistive device for balance Standing balance-Leahy Scale: Poor Standing balance comment: reliant on external supporrt                           ADL either performed or assessed with clinical judgement   ADL Overall ADL's : Needs assistance/impaired                     Lower Body Dressing: Maximal assistance;Sitting/lateral leans Lower Body Dressing Details (indicate cue type and reason): nephew indep asssiting Toilet Transfer: Contact guard assist;Minimal assistance Toilet Transfer Details (indicate cue type and reason): STS only as well as side steps toward HOB. MIn A for progression of RW           General ADL Comments: Focus session on education, rapport building, safety, and setting realistic expectations    Extremity/Trunk Assessment Upper Extremity Assessment Upper Extremity Assessment: Generalized weakness   Lower Extremity Assessment Lower Extremity Assessment: Defer to PT evaluation        Vision   Vision Assessment?: No apparent visual deficits   Perception  Praxis      Cognition Arousal: Alert Behavior During Therapy: WFL for tasks assessed/performed Overall Cognitive Status: Within Functional Limits for tasks assessed                                 General Comments: Pt immediately reporting that he has realized he will never live at home again on first arrival  (eating so cancelled), and with poor awareness of role of inpatient rehabilitation services.        Exercises      Shoulder Instructions       General Comments Nephew present    Pertinent Vitals/ Pain       Pain Assessment Pain Assessment: Faces Faces Pain Scale: Hurts little more Pain Location: LLE Pain Descriptors / Indicators: Sore, Tightness, Grimacing Pain Intervention(s): Limited activity within patient's tolerance, Monitored during session  Home Living                                          Prior Functioning/Environment              Frequency  Min 1X/week        Progress Toward Goals  OT Goals(current goals can now be found in the care plan section)  Progress towards OT goals: Progressing toward goals (slowly)  Acute Rehab OT Goals Patient Stated Goal: get better OT Goal Formulation: With patient Time For Goal Achievement: 11/01/22 Potential to Achieve Goals: Good ADL Goals Pt Will Perform Upper Body Dressing: with supervision;sitting Pt Will Perform Lower Body Dressing: with contact guard assist;sitting/lateral leans;sit to/from stand;with adaptive equipment Pt Will Transfer to Toilet: with supervision;ambulating;regular height toilet Additional ADL Goal #1: pt will be able to stand x5 min for functional task in order to improve activity tolerance for ADLs  Plan      Co-evaluation                 AM-PAC OT "6 Clicks" Daily Activity     Outcome Measure   Help from another person eating meals?: None Help from another person taking care of personal grooming?: A Little Help from another person toileting, which includes using toliet, bedpan, or urinal?: A Lot Help from another person bathing (including washing, rinsing, drying)?: A Lot Help from another person to put on and taking off regular upper body clothing?: A Little Help from another person to put on and taking off regular lower body clothing?: A Lot 6 Click Score:  16    End of Session Equipment Utilized During Treatment: Gait belt;Rolling walker (2 wheels)  OT Visit Diagnosis: Unsteadiness on feet (R26.81);Other abnormalities of gait and mobility (R26.89);Muscle weakness (generalized) (M62.81);History of falling (Z91.81)   Activity Tolerance Patient tolerated treatment well   Patient Left in bed;with call bell/phone within reach;with bed alarm set;with family/visitor present   Nurse Communication Mobility status        Time: 1655-1735 OT Time Calculation (min): 40 min  Charges: OT General Charges $OT Visit: 1 Visit OT Treatments $Self Care/Home Management : 8-22 mins $Therapeutic Activity: 23-37 mins  Tyler Deis, OTR/L [facility] Acute Rehabilitation Office: [phone]   Myrla Halsted 10/26/2022, 6:54 PM

## 2022-10-26 NOTE — Progress Notes (Signed)
[facility]  Date of Admission:  10/17/2022     Total days of antibiotics 10         ASSESSMENT:  [name] bacteremia has cleared with no evidence of endocarditis on TEE. Source of infection suspected to be dental with peridontal disease with dental caries and multiple erosions on CT.  Discussed need for dental follow up with likely dental extractions which has been previously recommended. Change antibiotics to Augmentin to bridge to maxillofacial evaluation and possible dental extractions. Primary team working with deconditioning and disposition. No additional follow up with ID needed at this time.    PLAN:  Change antibiotics to Augmentin.  Recommend referral to dentistry for evaluation and anticipated extractions Mobility and deconditioning per rehabilitation team. Remaining medical and supportive care per Internal Medicine.   ID will sign off. Please re-consult if needed.    I have personally spent 26 minutes involved in face-to-face and non-face-to-face activities for this patient on the day of the visit. Professional time spent includes the following activities: Preparing to see the patient (review of tests), Obtaining and/or reviewing separately obtained history (admission/discharge record), Performing a medically appropriate examination and/or evaluation , Ordering medications/tests/procedures, referring and communicating with other health care professionals, Documenting clinical information in the EMR, Independently interpreting results (not separately reported), Communicating results to the patient/family/caregiver, Counseling and educating the patient/family/caregiver and Care coordination (not separately reported).   Principal Problem:   Sepsis due to Streptococcus, group B (HCC) Active Problems:   Dyslipidemia   OBSTRUCTIVE SLEEP APNEA   ATRIAL FIBRILLATION   Nonrheumatic aortic valve stenosis   CAP (community acquired pneumonia)   HTN  (hypertension)   CAD (coronary artery disease)   Aortic dilatation (HCC)   Bacteremia    ammonium lactate   Topical BID   amoxicillin  1,000 mg Oral Q8H   carvedilol  12.5 mg Oral BID WC   cloNIDine  0.2 mg Oral BID   doxazosin  4 mg Oral Daily   hydrochlorothiazide  12.5 mg Oral Daily   leptospermum manuka honey  1 Application Topical Daily   losartan  100 mg Oral Daily   lubiprostone  24 mcg Oral BID   magnesium oxide  400 mg Oral BID   omega-3 acid ethyl esters  1 g Oral Daily   potassium chloride  20 mEq Oral Daily   warfarin  5 mg Oral q1600   Warfarin - Pharmacist Dosing Inpatient   Does not apply q1600    SUBJECTIVE:  Afebrile overnight with no acute events. Has concerns about decreased mobility and function.   Allergies  Allergen Reactions   Statins Other (See Comments)    "Muscles were messed up"      Review of Systems: Review of Systems  Constitutional:  Negative for chills, fever and weight loss.  Respiratory:  Negative for cough, shortness of breath and wheezing.   Cardiovascular:  Negative for chest pain and leg swelling.  Gastrointestinal:  Negative for abdominal pain, constipation, diarrhea, nausea and vomiting.  Skin:  Negative for rash.  Neurological:  Positive for weakness.      OBJECTIVE: Vitals:   10/26/22 0100 10/26/22 0557 10/26/22 0600 10/26/22 0736  BP: 109/65 (!) 139/112  128/86  Pulse: 75 67  82  Resp: 18 18  19   Temp: 98 F (36.7 C) 98 F (36.7 C)  98.1 F (36.7 C)  TempSrc: Oral Oral  Oral  SpO2: 96% 99%  97%  Weight:   109  kg   Height:       Body mass index is 34.48 kg/m.  Physical Exam Constitutional:      General: He is not in acute distress.    Appearance: He is well-developed.  Cardiovascular:     Rate and Rhythm: Normal rate and regular rhythm.     Heart sounds: Normal heart sounds.  Pulmonary:     Effort: Pulmonary effort is normal.     Breath sounds: Normal breath sounds.  Skin:    General: Skin is warm and  dry.  Neurological:     Mental Status: He is alert and oriented to person, place, and time.  Psychiatric:        Mood and Affect: Mood normal.     Lab Results Lab Results  Component Value Date   WBC 8.3 10/26/2022   HGB 10.8 (L) 10/26/2022   HCT 33.3 (L) 10/26/2022   MCV 92.0 10/26/2022   PLT 332 10/26/2022    Lab Results  Component Value Date   CREATININE 0.73 10/26/2022   BUN 19 10/26/2022   NA 133 (L) 10/26/2022   K 4.3 10/26/2022   CL 100 10/26/2022   CO2 25 10/26/2022    Lab Results  Component Value Date   ALT 7 10/26/2022   AST 17 10/26/2022   ALKPHOS 81 10/26/2022   BILITOT 0.4 10/26/2022     Microbiology: Recent Results (from the past 240 hour(s))  SARS Coronavirus 2 by RT PCR (hospital order, performed in [facility] hospital lab) *cepheid single result test* Anterior Nasal Swab     Status: None   Collection Time: 10/17/22  7:29 PM   Specimen: Anterior Nasal Swab  Result Value Ref Range Status   SARS Coronavirus 2 by RT PCR NEGATIVE NEGATIVE Final    Comment: Performed at [facility], [address], [place], [place] [address]  Culture, blood (Routine X 2) w Reflex to ID Panel     Status: Abnormal   Collection Time: 10/17/22  8:15 PM   Specimen: BLOOD  Result Value Ref Range Status   Specimen Description BLOOD LEFT ANTECUBITAL  Final   Special Requests   Final    BOTTLES DRAWN AEROBIC AND ANAEROBIC Blood Culture adequate volume   Culture  Setup Time   Final    GRAM POSITIVE COCCI IN CHAINS AEROBIC BOTTLE ONLY Organism ID to follow CRITICAL RESULT CALLED TO, READ BACK BY AND VERIFIED WITH: Joellen Jersey PHARMD, AT 1331 10/18/22 Renato Shin Performed at [facility], [address], [place], [place] [address]    Culture GROUP B STREP(S.AGALACTIAE)ISOLATED (A)  Final   Report Status 10/20/2022 FINAL  Final   Organism ID, Bacteria GROUP B STREP(S.AGALACTIAE)ISOLATED  Final      Susceptibility   Group b strep(s.agalactiae)isolated - MIC*     CLINDAMYCIN RESISTANT Resistant     AMPICILLIN <=0.25 SENSITIVE Sensitive     ERYTHROMYCIN 2 RESISTANT Resistant     VANCOMYCIN 0.5 SENSITIVE Sensitive     CEFTRIAXONE <=0.12 SENSITIVE Sensitive     LEVOFLOXACIN 1 SENSITIVE Sensitive     PENICILLIN <=0.06 SENSITIVE Sensitive     * GROUP B STREP(S.AGALACTIAE)ISOLATED  Blood Culture ID Panel (Reflexed)     Status: Abnormal   Collection Time: 10/17/22  8:15 PM  Result Value Ref Range Status   Enterococcus faecalis NOT DETECTED NOT DETECTED Final   Enterococcus Faecium NOT DETECTED NOT DETECTED Final   Listeria monocytogenes NOT DETECTED NOT DETECTED Final   Staphylococcus  species NOT DETECTED NOT DETECTED Final   Staphylococcus aureus (BCID) NOT DETECTED NOT DETECTED Final   Staphylococcus epidermidis NOT DETECTED NOT DETECTED Final   Staphylococcus lugdunensis NOT DETECTED NOT DETECTED Final   Streptococcus species DETECTED (A) NOT DETECTED Final    Comment: CRITICAL RESULT CALLED TO, READ BACK BY AND VERIFIED WITH: S. MOODY PHARMD, AT 1331 10/18/22 D. VANHOOK    Streptococcus agalactiae DETECTED (A) NOT DETECTED Final    Comment: CRITICAL RESULT CALLED TO, READ BACK BY AND VERIFIED WITH: S. MOODY PHARMD, AT 1331 10/18/22 D. VANHOOK    Streptococcus pneumoniae NOT DETECTED NOT DETECTED Final   Streptococcus pyogenes NOT DETECTED NOT DETECTED Final   A.calcoaceticus-baumannii NOT DETECTED NOT DETECTED Final   Bacteroides fragilis NOT DETECTED NOT DETECTED Final   Enterobacterales NOT DETECTED NOT DETECTED Final   Enterobacter cloacae complex NOT DETECTED NOT DETECTED Final   Escherichia coli NOT DETECTED NOT DETECTED Final   Klebsiella aerogenes NOT DETECTED NOT DETECTED Final   Klebsiella oxytoca NOT DETECTED NOT DETECTED Final   Klebsiella pneumoniae NOT DETECTED NOT DETECTED Final   Proteus species NOT DETECTED NOT DETECTED Final   Salmonella species NOT DETECTED NOT DETECTED Final   Serratia marcescens NOT DETECTED NOT DETECTED  Final   Haemophilus influenzae NOT DETECTED NOT DETECTED Final   Neisseria meningitidis NOT DETECTED NOT DETECTED Final   Pseudomonas aeruginosa NOT DETECTED NOT DETECTED Final   Stenotrophomonas maltophilia NOT DETECTED NOT DETECTED Final   Candida albicans NOT DETECTED NOT DETECTED Final   Candida auris NOT DETECTED NOT DETECTED Final   Candida glabrata NOT DETECTED NOT DETECTED Final   Candida krusei NOT DETECTED NOT DETECTED Final   Candida parapsilosis NOT DETECTED NOT DETECTED Final   Candida tropicalis NOT DETECTED NOT DETECTED Final   Cryptococcus neoformans/gattii NOT DETECTED NOT DETECTED Final    Comment: Performed at [facility], [address], [place], [place] [address]  Culture, blood (Routine X 2) w Reflex to ID Panel     Status: None   Collection Time: 10/20/22 11:52 AM   Specimen: BLOOD LEFT ARM  Result Value Ref Range Status   Specimen Description BLOOD LEFT ARM  Final   Special Requests   Final    BOTTLES DRAWN AEROBIC AND ANAEROBIC Blood Culture adequate volume   Culture   Final    NO GROWTH 5 DAYS Performed at [facility], [address], [place], [place] [address]    Report Status 10/25/2022 FINAL  Final  Culture, blood (Routine X 2) w Reflex to ID Panel     Status: None   Collection Time: 10/20/22 11:52 AM   Specimen: BLOOD LEFT HAND  Result Value Ref Range Status   Specimen Description BLOOD LEFT HAND  Final   Special Requests   Final    BOTTLES DRAWN AEROBIC AND ANAEROBIC Blood Culture adequate volume   Culture   Final    NO GROWTH 5 DAYS Performed at [facility], [address] [place], [place] [address]    Report Status 10/25/2022 FINAL  Final     Marcos Eke, NP [facility]  10/26/2022  2:38 PM

## 2022-10-26 NOTE — TOC Progression Note (Signed)
Transition of Care [facility]) - Progression Note    Patient Details  Name: [name] MRN: [id number] Date of Birth: 22-Aug-1950  [facility] CM/SW Contact  Janae Bridgeman, RN Phone Number: 10/26/2022, 10:15 AM  Clinical Narrative:    Patient was seen by PT/OT and SNF is now being recommended.  SNF work up started and patient was faxed out in the hub.  MSW to follow up with patient regarding bed offers when available.        Expected Discharge Plan and Services                                               Social Determinants of Health (SDOH) Interventions SDOH Screenings   Food Insecurity: No Food Insecurity (10/18/2022)  Housing: Low Risk  (10/18/2022)  Transportation Needs: No Transportation Needs (10/18/2022)  Utilities: Not At Risk (10/18/2022)  Alcohol Screen: Low Risk  (03/21/2022)  Depression (PHQ2-9): Low Risk  (09/09/2022)  Financial Resource Strain: Low Risk  (03/21/2022)  Physical Activity: Insufficiently Active (03/21/2022)  Social Connections: Socially Isolated (03/21/2022)  Stress: No Stress Concern Present (03/21/2022)  Tobacco Use: Low Risk  (10/25/2022)    Readmission Risk Interventions    10/18/2022    4:24 PM  Readmission Risk Prevention Plan  Post Dischage Appt Complete  Medication Screening Complete  Transportation Screening Complete

## 2022-10-26 NOTE — Progress Notes (Signed)
ANTICOAGULATION CONSULT NOTE - Follow Up Consult  Pharmacy Consult for Warfarin Indication: atrial fibrillation  Allergies  Allergen Reactions   Statins Other (See Comments)    "Muscles were messed up"     Patient Measurements: Height: 5\' 10"  (177.8 cm) Weight: 109 kg (240 lb 4.8 oz) IBW/kg (Calculated) : 73  Vital Signs: Temp: 98.1 F (36.7 C) (08/21 0736) Temp Source: Oral (08/21 0736) BP: 128/86 (08/21 0736) Pulse Rate: 82 (08/21 0736)  Labs: Recent Labs    10/24/22 0434 10/25/22 0431 10/26/22 0537  HGB  --  10.4*  --   HCT  --  32.2*  --   PLT  --  330  --   LABPROT 26.5*  --  27.9*  INR 2.4*  --  2.6*  CREATININE  --  0.88  --     Estimated Creatinine Clearance: 93.8 mL/min (by C-G formula based on SCr of 0.88 mg/dL).  Assessment: 72 y/o M presented to the ED on 10/17/22 with generalized weakness. On warfarin PTA for afib. INR supratherapeutic on admit at 3.8; trended down close to therapeutic at 3.3 on 8/13 and warfarin 5 mg x 1 was given (missed dose 8/12 PTA). INR trended back up to 4.4 on 8/14 and doses were held 8/14 and 8/15 when INR remained >3.   Noted DDI with azithromycin received x 1 dose on 8/12 - likely contributed to INR increase.   PTA warfarin regimen: 7.5 mg MWF and 5 mg TTSS - last outpatient INR 2.2 on 7/31   INR remains therapeutic at 2.6.   Goal of Therapy:  INR 2-3 Monitor platelets by anticoagulation protocol: Yes   Plan:  Warfarin 5 mg daily  INR MWF  Monitor for signs/symptoms of bleeding and DDI.  Ulyses Southward, PharmD, BCIDP, AAHIVP, CPP Infectious Disease Pharmacist 10/26/2022 9:17 AM

## 2022-10-26 NOTE — Progress Notes (Signed)
OT Cancellation Note  Patient Details Name: [name] MRN: [id number] DOB: March 04, 1951   Cancelled Treatment:    Reason Eval/Treat Not Completed: Other (comment). Pt eating lunch on arrival, but reporting he "has decided he needed to live some place that staff can take care of him" and not be a burden on his family. Pt unsure about desire to resume therapy in this regard after statement but actively seeking education in regard to options from OT. After review of options, pt seems hopeful to continue participating in therapy to optimize QOL/ functional participation in ADL while considering that he may still need to transition over to long term care after rehab. Pt educated in regard to potential goals of therapy and nephew in room with pt at this time. Nephew/patient requesting OT to return after nephew's work conference this afternoon when they have had time to discuss. Offered palliative consult to more thoroughly review goals of care and explore pt options to gather his thoughts and better understand his own wishes. RN placed palliative consult on OT request after OT saw this pt.   [name], OTR/L [facility] Acute Rehabilitation Office: [phone]   [name] 10/26/2022, 12:50 PM

## 2022-10-26 NOTE — TOC Progression Note (Signed)
Transition of Care [facility]) - Progression Note    Patient Details  Name: [name] MRN: [id number] Date of Birth: June 09, 1950  Transition of Care [facility]) CM/SW Contact  Nazario Russom A [place], [place] Phone Number: 10/26/2022, 12:44 PM  Clinical Narrative:     Update 8/22 1235 CSW met with pt and nephew [name] at bedside, provided information for follow up contact information to [employer] and [employer]. Nephew reached out to facilities and will get back to CSW with decision for facility choice once he visits facilities.    TOC will continue to follow.   CSW met with pt at bedside with nephew, [name] and presented bed offers. Pt stated that he wanted rehab but long term he said he needed a long term care stay.   CSW explained that short term rehab to start and then go to a facility for long term is possible, but facility would need to have long term care beds available.   Nephew stated he would want to speak with prospective facilities and update CSW on choice once he and pt have decided.   TOC will continue to follow.        Expected Discharge Plan and Services                                               Social Determinants of Health (SDOH) Interventions SDOH Screenings   Food Insecurity: No Food Insecurity (10/18/2022)  Housing: Low Risk  (10/18/2022)  Transportation Needs: No Transportation Needs (10/18/2022)  Utilities: Not At Risk (10/18/2022)  Alcohol Screen: Low Risk  (03/21/2022)  Depression (PHQ2-9): Low Risk  (09/09/2022)  Financial Resource Strain: Low Risk  (03/21/2022)  Physical Activity: Insufficiently Active (03/21/2022)  Social Connections: Socially Isolated (03/21/2022)  Stress: No Stress Concern Present (03/21/2022)  Tobacco Use: Low Risk  (10/25/2022)    Readmission Risk Interventions    10/18/2022    4:24 PM  Readmission Risk Prevention Plan  Post Dischage Appt Complete  Medication Screening Complete  Transportation Screening Complete

## 2022-10-27 ENCOUNTER — Inpatient Hospital Stay (HOSPITAL_COMMUNITY): Payer: PPO

## 2022-10-27 DIAGNOSIS — A401 Sepsis due to streptococcus, group B: Secondary | ICD-10-CM | POA: Diagnosis not present

## 2022-10-27 DIAGNOSIS — R7881 Bacteremia: Secondary | ICD-10-CM | POA: Diagnosis not present

## 2022-10-27 DIAGNOSIS — J189 Pneumonia, unspecified organism: Secondary | ICD-10-CM | POA: Diagnosis not present

## 2022-10-27 DIAGNOSIS — I77819 Aortic ectasia, unspecified site: Secondary | ICD-10-CM | POA: Diagnosis not present

## 2022-10-27 LAB — CBC WITH DIFFERENTIAL/PLATELET
Abs Immature Granulocytes: 0.1 10*3/uL — ABNORMAL HIGH (ref 0.00–0.07)
Basophils Absolute: 0 10*3/uL (ref 0.0–0.1)
Basophils Relative: 0 %
Eosinophils Absolute: 0.1 10*3/uL (ref 0.0–0.5)
Eosinophils Relative: 1 %
HCT: 32.6 % — ABNORMAL LOW (ref 39.0–52.0)
Hemoglobin: 10.6 g/dL — ABNORMAL LOW (ref 13.0–17.0)
Immature Granulocytes: 1 %
Lymphocytes Relative: 16 %
Lymphs Abs: 1.5 10*3/uL (ref 0.7–4.0)
MCH: 30.3 pg (ref 26.0–34.0)
MCHC: 32.5 g/dL (ref 30.0–36.0)
MCV: 93.1 fL (ref 80.0–100.0)
Monocytes Absolute: 0.7 10*3/uL (ref 0.1–1.0)
Monocytes Relative: 7 %
Neutro Abs: 6.8 10*3/uL (ref 1.7–7.7)
Neutrophils Relative %: 75 %
Platelets: 338 10*3/uL (ref 150–400)
RBC: 3.5 MIL/uL — ABNORMAL LOW (ref 4.22–5.81)
RDW: 14.5 % (ref 11.5–15.5)
WBC: 9.3 10*3/uL (ref 4.0–10.5)
nRBC: 0 % (ref 0.0–0.2)

## 2022-10-27 LAB — COMPREHENSIVE METABOLIC PANEL
ALT: 9 U/L (ref 0–44)
AST: 18 U/L (ref 15–41)
Albumin: 2.4 g/dL — ABNORMAL LOW (ref 3.5–5.0)
Alkaline Phosphatase: 83 U/L (ref 38–126)
Anion gap: 7 (ref 5–15)
BUN: 22 mg/dL (ref 8–23)
CO2: 24 mmol/L (ref 22–32)
Calcium: 10.3 mg/dL (ref 8.9–10.3)
Chloride: 101 mmol/L (ref 98–111)
Creatinine, Ser: 0.85 mg/dL (ref 0.61–1.24)
GFR, Estimated: >60 mL/min (ref >60)
Glucose, Bld: 119 mg/dL — ABNORMAL HIGH (ref 70–99)
Potassium: 4.5 mmol/L (ref 3.5–5.1)
Sodium: 132 mmol/L — ABNORMAL LOW (ref 135–145)
Total Bilirubin: 0.5 mg/dL (ref 0.3–1.2)
Total Protein: 6 g/dL — ABNORMAL LOW (ref 6.5–8.1)

## 2022-10-27 LAB — PROTIME-INR
INR: 2.3 — ABNORMAL HIGH (ref 0.8–1.2)
Prothrombin Time: 25.4 seconds — ABNORMAL HIGH (ref 11.4–15.2)

## 2022-10-27 LAB — MAGNESIUM: Magnesium: 2.1 mg/dL (ref 1.7–2.4)

## 2022-10-27 LAB — PHOSPHORUS: Phosphorus: 2.3 mg/dL — ABNORMAL LOW (ref 2.5–4.6)

## 2022-10-27 MED ORDER — SODIUM PHOSPHATES 45 MMOLE/15ML IV SOLN
15.0000 mmol | Freq: Once | INTRAVENOUS | Status: AC
  Administered 2022-10-27: 15 mmol via INTRAVENOUS
  Filled 2022-10-27: qty 5

## 2022-10-27 MED ORDER — BISACODYL 10 MG RE SUPP
10.0000 mg | Freq: Every day | RECTAL | Status: DC | PRN
  Administered 2022-10-27 – 2022-10-30 (×3): 10 mg via RECTAL
  Filled 2022-10-27 (×3): qty 1

## 2022-10-27 MED ORDER — AMOXICILLIN 500 MG PO CAPS: 500.0000 mg | ORAL_CAPSULE | Freq: Two times a day (BID) | ORAL | Status: DC

## 2022-10-27 NOTE — Progress Notes (Signed)
   10/27/22 0030  BiPAP/CPAP/SIPAP  Reason BIPAP/CPAP not in use Other(comment) (Does not use CPAP @home )

## 2022-10-27 NOTE — Progress Notes (Addendum)
PROGRESS NOTE    [name]  NGE:[id number] DOB: Aug 17, 1950 DOA: 10/17/2022 PCP: [name], MD   Brief Narrative:  The patient is a 72 year old Caucasian male with a past medical history significant for but not to HTN, moderate aortic stenosis, mitral valve prolapse, atrial fibrillation, hyperlipidemia, OSA not on CPAP, Obesity as well as other comorbidities who earlier in the week developed what he does describes as a viral illness with shakes and chills but then subsequently resolved.  He has not become weaker and weaker since and eventually fell but did not hit his head and there is no loss of consciousness.  Given his fall he is brought to the ED after he called 911 and in the ED he was found to be febrile.  Chest x-ray was done and showed cardiomegaly with pulmonary vascular congestion and possible edema versus infiltrate.  Lactic acid level was elevated at 5 and he was given IV Rocephin and azithromycin.  He is admitted for further workup and lactic acid started downtrending and WBC is now resolved but blood cultures came back positive for Streptococcus.  He is now being treated for streptococcal bacteremia and he was placed on IV Cefazolin and further workup reveals likely the source of the infection is suspected to be dental with peridental disease with dental caries and multiple erosions.  His IV cefazolin has now been changed to oral amoxicillin to bridge maxillofacial evaluation and possible dental extraction.  Given his deconditioning PT OT have evaluated and recommending SNF and he is now agreeable but was at first hesitant to go to rehab. He is medically stable to D/C and will need follow up with PCP, ID and Cardiology in the outpatient setting.   Assessment and Plan:  Sepsis Secondary to Streptococcal bacteremia and community-acquired pneumonia:  -Vitals are stable this a.m. -Leukocytosis resolved.  TTE and TEE negative for endocarditis -ID recommendations: Continue IV cefazolin 2  g every 8 hours but now changing to oral Amoxicillin 1000 mg po q8h -Continue Monitor Vitals -ID ordered imaging of teeth and it showed "Periodontal disease and alveolar resorption with exposure of the roots of the maxillary and mandibular dentition. Superimposed dental caries with multiple erosions within the residual maxillary molars. No periapical lucency identified." -ID also pending referral to [facility] for evaluation of his dental caries and anticipating extractions and likely will need a maxillofacial surgeon -PT OT recommending SNF and Anticipating D/C when Bed is avavilable and Insurance Auth obtained  -Wants to speak with Palliative So Consult has been placed.    Dyspnea and Hypoxia in the setting of Streptococcal PNA -Improved, now on room air SpO2: 97 % O2 Flow Rate (L/min): 2 L/min -Give incentive spirometry -Continuous Pulse Oximetry and Maintain O2 Sat's greater than 90% -Continue Supplemental O2 via [place] and Wean O2 as Tolerated -Will need an Ambulatory Home O2 Screen prior to D/C -Repeat CXR this AM done and showed "No evidence of acute cardiopulmonary disease. Improved/resolved probable interstitial edema."   Normocytic Anemia -Chronic. Hgb/Hct Trend: Recent Labs  Lab 10/20/22 0157 10/21/22 0911 10/22/22 0651 10/23/22 0555 10/25/22 0431 10/26/22 0537 10/27/22 0014  HGB 10.5* 10.4* 10.5* 10.1* 10.4* 10.8* 10.6*  HCT 31.1* 31.1* 31.7* 30.9* 32.2* 33.3* 32.6*  MCV 90.7 88.9 89.3 91.4 91.5 92.0 93.1  -Check Anemia Panel in the AM -Continue to Monitor for S/Sx of Bleeding; No overt Bleeding noted -Repeat CBC in the AM  Hypokalemia, improved -Patient's K+ Level Trend: Recent Labs  Lab 10/20/22 0157 10/21/22 0911  10/22/22 1610 10/23/22 0555 10/25/22 0431 10/26/22 0537 10/27/22 0014  K 3.5 3.0* 4.4 4.9 4.0 4.3 4.5  -Continue to Monitor and Replete as Necessary -Repeat CMP in the AM    Obstructive Sleep Apnea -Give incentive spirometry -Does not use CPAP at  home. -Would benefit from CPAP at bedtime.   Chronic Atrial Fibrillation -Rate controlled -CHA?DS?-VASc of at least 3. -INR is therapeutic at 2.3 this a.m. -Continue warfarin 5 mg today (per pharmacy) -Follow-up PT/INR -On Carvedilol 12.5 mg po BID -Continue to Monitor on Telemetry  Hyponatremia -In the setting of Hydrochlorothiazide use -Will Hold and Discontinue Hydrochlorothiazide -Na+ Trend:  Recent Labs  Lab 10/20/22 0157 10/21/22 0911 10/22/22 0651 10/23/22 0555 10/25/22 0431 10/26/22 0537 10/27/22 0014  NA 132* 135 137 134* 134* 133* 132*  -Replete with IV Sodium Phos 15 mmol  -Continue to Monitor and Trend and repeat CMP in the AM  Hypophosphatemia -Phos Level Trend: Recent Labs  Lab 10/21/22 0907 10/26/22 0537 10/27/22 0014  PHOS 1.5* 2.9 2.3*  -Replete with IV Sodium Phos 15 mmol -Continue to Monitor and Replete as Necessary -Repeat Phos Level in the AM   Nonrheumatic aortic valve stenosis -Continue Coumadin   CAD (coronary artery disease) -No acute chest pain symptoms -On warfarin per pharmacy. -Continue Losartan 100 mg p.o. daily and Carvedilol 12.5 mg po BID -Continue to Monitor and Trend   HTN (Hypertension) -Mildly elevated blood pressure. -Continue ARB with Losartan 100 mg po Daily, Clonidine 0.2 mg po BID, Carvedilol 12.5 mg po BID, and Doxazosin 4 mg po Daily  -Will Discontinue Hydrochlorothiazide 12.5 mg p.o. daily due to Hyponatremia -Continue to Monitor BP per Protocol and Last BP reading was on the softer side 103/66   Aortic Dilatation Moderate aortic stenosis Mitral regurgitation -EF 60-65% no RWMA, small pericardial effusion,  -Mitral valve is normal and Aortic valve is normal  -Mild dilatation of aortic root and ascending aorta no interatrial shunt.  -Needs Cardiology Follow up at DC    Dyslipidemia -Currently not on medical therapy except Omega-3 Acid Ethyl Esters 1 gram po Daily -Follow-up with PCP.  Constipation -C/w  Senna-docusate 1 tab po at bedtime prn and will add Bisacodyl 10 mg RC Dailyprn Moderate Constipation -C/w Lubiprostone 24 mcg po BID -Has an Adverse Effect to Miralax   Physical Debility/Generalized Deconditioning:  -PT recommended home health as patient was declining rehab.   -Unable to take care of himself and Likely will need rehab and now PT/OT recommending SNF and Nephew and Patient in agreement  Hypoalbuminemia -Patient's Albumin Trend: Recent Labs  Lab 10/17/22 1929 10/26/22 0537 10/27/22 0014  ALBUMIN 2.9* 2.4* 2.4*  -Continue to Monitor and Trend and repeat CMP in the AM  Obesity -Complicates overall prognosis and care -Estimated body mass index is 34.48 kg/m as calculated from the following:   Height as of this encounter: 5\' 10"  (1.778 m).   Weight as of this encounter: 109 kg.  -Weight Loss and Dietary Counseling given   DVT prophylaxis:  warfarin (COUMADIN) tablet 5 mg    Code Status: DNR Family Communication: Discussed with Nephew at bedside  Disposition Plan:  Level of care: Telemetry Medical Status is: Inpatient Remains inpatient appropriate because: Needs Insurance Auth and Bed Availability    Consultants:  Infectious Diseases Palliative Care Medicine  Cardiology for TEE  Procedures:  TRANSTHORACIC ECHOCARDIOGRAM IMPRESSIONS     1. Left ventricular ejection fraction, by estimation, is 60 to 65%. The  left ventricle has normal function. The  left ventricle has no regional  wall motion abnormalities. There is mild concentric left ventricular  hypertrophy. Left ventricular diastolic  function could not be evaluated. Elevated left atrial pressure.   2. Right ventricular systolic function is normal. The right ventricular  size is normal.   3. A small pericardial effusion is present.   4. The mitral valve is normal in structure. Mild mitral valve  regurgitation. No evidence of mitral stenosis.   5. The aortic valve is normal in structure. Aortic valve  regurgitation is  trivial. Moderate aortic valve stenosis.   6. Aortic dilatation noted. There is mild dilatation of the aortic root,  measuring 39 mm. There is mild dilatation of the ascending aorta,  measuring 41 mm.   7. The inferior vena cava is dilated in size with >50% respiratory  variability, suggesting right atrial pressure of 8 mmHg.   FINDINGS   Left Ventricle: Left ventricular ejection fraction, by estimation, is 60  to 65%. The left ventricle has normal function. The left ventricle has no  regional wall motion abnormalities. The left ventricular internal cavity  size was normal in size. There is   mild concentric left ventricular hypertrophy. Left ventricular diastolic  function could not be evaluated due to atrial fibrillation. Left  ventricular diastolic function could not be evaluated. Elevated left  atrial pressure.   Right Ventricle: The right ventricular size is normal. Right ventricular  systolic function is normal.   Left Atrium: Left atrial size was normal in size.   Right Atrium: Right atrial size was normal in size.   Pericardium: A small pericardial effusion is present.   Mitral Valve: The mitral valve is normal in structure. Mild mitral valve  regurgitation. No evidence of mitral valve stenosis. MV peak gradient, 7.1  mmHg. The mean mitral valve gradient is 4.0 mmHg.   Tricuspid Valve: The tricuspid valve is normal in structure. Tricuspid  valve regurgitation is trivial. No evidence of tricuspid stenosis.   Aortic Valve: The aortic valve is normal in structure. Aortic valve  regurgitation is trivial. Aortic regurgitation PHT measures 684 msec.  Moderate aortic stenosis is present. Aortic valve mean gradient measures  24.0 mmHg. Aortic valve peak gradient  measures 44.7 mmHg. Aortic valve area, by VTI measures 1.49 cm.   Pulmonic Valve: The pulmonic valve was normal in structure. Pulmonic valve  regurgitation is trivial. No evidence of pulmonic  stenosis.   Aorta: Aortic dilatation noted. There is mild dilatation of the aortic  root, measuring 39 mm. There is mild dilatation of the ascending aorta,  measuring 41 mm.   Venous: The inferior vena cava is dilated in size with greater than 50%  respiratory variability, suggesting right atrial pressure of 8 mmHg.   IAS/Shunts: No atrial level shunt detected by color flow Doppler.     LEFT VENTRICLE  PLAX 2D  LVIDd:         5.10 cm     Diastology  LVIDs:         3.60 cm     LV e' medial:    7.84 cm/s  LV PW:         1.00 cm     LV E/e' medial:  17.0  LV IVS:        1.10 cm     LV e' lateral:   8.33 cm/s  LVOT diam:     1.90 cm     LV E/e' lateral: 16.0  LV SV:  93  LV SV Index:   41  LVOT Area:     2.84 cm    LV Volumes (MOD)  LV vol d, MOD A2C: 94.2 ml  LV vol d, MOD A4C: 95.3 ml  LV vol s, MOD A2C: 43.1 ml  LV vol s, MOD A4C: 42.5 ml  LV SV MOD A2C:     51.1 ml  LV SV MOD A4C:     95.3 ml  LV SV MOD BP:      54.4 ml   RIGHT VENTRICLE             IVC  RV Basal diam:  4.00 cm     IVC diam: 2.80 cm  RV S prime:     12.47 cm/s  TAPSE (M-mode): 2.2 cm   LEFT ATRIUM             Index        RIGHT ATRIUM           Index  LA diam:        4.30 cm 1.90 cm/m   RA Area:     20.90 cm  LA Vol (A2C):   77.1 ml 34.05 ml/m  RA Volume:   56.40 ml  24.91 ml/m  LA Vol (A4C):   69.0 ml 30.47 ml/m  LA Biplane Vol: 73.9 ml 32.63 ml/m   AORTIC VALVE  AV Area (Vmax):    1.40 cm  AV Area (Vmean):   1.52 cm  AV Area (VTI):     1.49 cm  AV Vmax:           334.25 cm/s  AV Vmean:          230.500 cm/s  AV VTI:            0.625 m  AV Peak Grad:      44.7 mmHg  AV Mean Grad:      24.0 mmHg  LVOT Vmax:         165.00 cm/s  LVOT Vmean:        123.667 cm/s  LVOT VTI:          0.328 m  LVOT/AV VTI ratio: 0.52  AI PHT:            684 msec    AORTA  Ao Root diam: 3.90 cm  Ao Asc diam:  4.10 cm   MITRAL VALVE                TRICUSPID VALVE  MV Area (PHT): 4.22 cm     TR  Peak grad:   29.8 mmHg  MV Area VTI:   4.02 cm     TR Vmax:        273.00 cm/s  MV Peak grad:  7.1 mmHg  MV Mean grad:  4.0 mmHg     SHUNTS  MV Vmax:       1.33 m/s     Systemic VTI:  0.33 m  MV Vmean:      93.0 cm/s    Systemic Diam: 1.90 cm  MV Decel Time: 180 msec  MV E velocity: 133.67 cm/s   TRANSESOPHAGEAL ECHOCARDIOGRAM     Brief TEE Note   LVEF 60-65% 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 No LA/LAA thrombus or masses No evidence of endocarditis Trivial pericardial effusion Small pleural effusion         Antimicrobials:  Anti-infectives (From  admission, onward)    Start     Dose/Rate Route Frequency Ordered Stop   11/03/22 1000  amoxicillin (AMOXIL) capsule 500 mg        500 mg Oral Every 12 hours 10/27/22 0910 11/10/22 0959   10/26/22 1400  amoxicillin (AMOXIL) capsule 1,000 mg        1,000 mg Oral Every 8 hours 10/26/22 1136 11/02/22 2359   10/19/22 1400  ceFAZolin (ANCEF) IVPB 2g/100 mL premix  Status:  Discontinued        2 g 200 mL/hr over 30 Minutes Intravenous Every 8 hours 10/18/22 1441 10/26/22 1136   10/19/22 1100  amoxicillin (AMOXIL) capsule 500 mg        500 mg Oral  Once 10/19/22 1014 10/19/22 1225   10/18/22 1400  cefTRIAXone (ROCEPHIN) 2 g in sodium chloride 0.9 % 100 mL IVPB  Status:  Discontinued        2 g 200 mL/hr over 30 Minutes Intravenous Every 24 hours 10/18/22 1054 10/18/22 1441   10/17/22 2045  azithromycin (ZITHROMAX) 500 mg in sodium chloride 0.9 % 250 mL IVPB  Status:  Discontinued        500 mg 250 mL/hr over 60 Minutes Intravenous Every 24 hours 10/17/22 2039 10/18/22 1441   10/17/22 2045  cefTRIAXone (ROCEPHIN) 1 g in sodium chloride 0.9 % 100 mL IVPB  Status:  Discontinued        1 g 200 mL/hr over 30 Minutes Intravenous Every 24 hours 10/17/22 2039 10/18/22 1054       Subjective: Examined at bedside and thinks he is doing okay but does  complain of some constipation.  States he he is uncomfortable in the bed.  No other concerns or questions this time and wants to go to rehab but then states that he wants to then transition to long-term care at the SNF.  No nausea or vomiting.  No other concerns or questions time.  Objective: Vitals:   10/26/22 1918 10/27/22 0418 10/27/22 0737 10/27/22 1300  BP: 118/73 117/86 (!) 148/87 103/66  Pulse: 63 67 73 66  Resp:   18   Temp: 98.2 F (36.8 C) 98 F (36.7 C) 98 F (36.7 C) 99.1 F (37.3 C)  TempSrc: Oral Oral Oral Oral  SpO2: 98% 99% 97% 97%  Weight:      Height:        Intake/Output Summary (Last 24 hours) at 10/27/2022 1517 Last data filed at 10/27/2022 1512 Gross per 24 hour  Intake --  Output 2750 ml  Net -2750 ml   Filed Weights   10/18/22 0448 10/26/22 0600  Weight: 109.9 kg 109 kg   Examination: Physical Exam:  Constitutional: WN/WD obese Caucasian male in no acute distress Respiratory: Diminished to auscultation bilaterally, no wheezing, rales, rhonchi or crackles. Normal respiratory effort and patient is not tachypenic. No accessory muscle use.  Unlabored breathing Cardiovascular: RRR, no murmurs / rubs / gallops. S1 and S2 auscultated.  Trace extremity edema Abdomen: Soft, non-tender, distended secondary to body habitus. Bowel sounds positive.  GU: Deferred. Musculoskeletal: No clubbing / cyanosis of digits/nails. No joint deformity upper and lower extremities. Skin: Has dry skin in the LE Neurologic: CN 2-12 grossly intact with no focal deficits.  Romberg sign and cerebellar reflexes not assessed.  Psychiatric: Normal judgment and insight. Alert and oriented x 3. Normal mood and appropriate affect.   Data Reviewed: I have personally reviewed following labs and imaging studies  CBC: Recent Labs  Lab  10/22/22 8657 10/23/22 0555 10/25/22 0431 10/26/22 0537 10/27/22 0014  WBC 8.7 9.5 7.4 8.3 9.3  NEUTROABS  --   --   --  5.7 6.8  HGB 10.5* 10.1* 10.4*  10.8* 10.6*  HCT 31.7* 30.9* 32.2* 33.3* 32.6*  MCV 89.3 91.4 91.5 92.0 93.1  PLT 281 291 330 332 338   Basic Metabolic Panel: Recent Labs  Lab 10/21/22 0907 10/21/22 0911 10/22/22 0651 10/23/22 0555 10/25/22 0431 10/26/22 0537 10/27/22 0014  NA  --    < > 137 134* 134* 133* 132*  K  --    < > 4.4 4.9 4.0 4.3 4.5  CL  --    < > 105 101 100 100 101  CO2  --    < > 25 28 26 25 24   GLUCOSE  --    < > 112* 123* 105* 104* 119*  BUN  --    < > 16 16 22 19 22   CREATININE  --    < > 1.05 1.10 0.88 0.73 0.85  CALCIUM  --    < > 10.0 10.2 10.3 10.2 10.3  MG 1.9  --   --   --  2.3 2.2 2.1  PHOS 1.5*  --   --   --   --  2.9 2.3*   < > = values in this interval not displayed.   GFR: Estimated Creatinine Clearance: 97.1 mL/min (by C-G formula based on SCr of 0.85 mg/dL). Liver Function Tests: Recent Labs  Lab 10/26/22 0537 10/27/22 0014  AST 17 18  ALT 7 9  ALKPHOS 81 83  BILITOT 0.4 0.5  PROT 5.9* 6.0*  ALBUMIN 2.4* 2.4*   No results for input(s): "LIPASE", "AMYLASE" in the last 168 hours. No results for input(s): "AMMONIA" in the last 168 hours. Coagulation Profile: Recent Labs  Lab 10/22/22 0651 10/23/22 0555 10/24/22 0434 10/26/22 0537 10/27/22 0909  INR 2.5* 2.6* 2.4* 2.6* 2.3*   Cardiac Enzymes: No results for input(s): "CKTOTAL", "CKMB", "CKMBINDEX", "TROPONINI" in the last 168 hours. BNP (last 3 results) No results for input(s): "PROBNP" in the last 8760 hours. HbA1C: No results for input(s): "HGBA1C" in the last 72 hours. CBG: Recent Labs  Lab 10/23/22 0811  GLUCAP 164*   Lipid Profile: No results for input(s): "CHOL", "HDL", "LDLCALC", "TRIG", "CHOLHDL", "LDLDIRECT" in the last 72 hours. Thyroid Function Tests: No results for input(s): "TSH", "T4TOTAL", "FREET4", "T3FREE", "THYROIDAB" in the last 72 hours. Anemia Panel: No results for input(s): "VITAMINB12", "FOLATE", "FERRITIN", "TIBC", "IRON", "RETICCTPCT" in the last 72 hours. Sepsis Labs: No  results for input(s): "PROCALCITON", "LATICACIDVEN" in the last 168 hours.  Recent Results (from the past 240 hour(s))  SARS Coronavirus 2 by RT PCR (hospital order, performed in [facility] hospital lab) *cepheid single result test* Anterior Nasal Swab     Status: None   Collection Time: 10/17/22  7:29 PM   Specimen: Anterior Nasal Swab  Result Value Ref Range Status   SARS Coronavirus 2 by RT PCR NEGATIVE NEGATIVE Final    Comment: Performed at [facility], [address], [place], [place] [address]  Culture, blood (Routine X 2) w Reflex to ID Panel     Status: Abnormal   Collection Time: 10/17/22  8:15 PM   Specimen: BLOOD  Result Value Ref Range Status   Specimen Description BLOOD LEFT ANTECUBITAL  Final   Special Requests   Final    BOTTLES DRAWN AEROBIC AND ANAEROBIC Blood Culture adequate  volume   Culture  Setup Time   Final    GRAM POSITIVE COCCI IN CHAINS AEROBIC BOTTLE ONLY Organism ID to follow CRITICAL RESULT CALLED TO, READ BACK BY AND VERIFIED WITH: Joellen Jersey PHARMD, AT 1331 10/18/22 Renato Shin Performed at [facility], [address], [place], [place] [address]    Culture GROUP B STREP(S.AGALACTIAE)ISOLATED (A)  Final   Report Status 10/20/2022 FINAL  Final   Organism ID, Bacteria GROUP B STREP(S.AGALACTIAE)ISOLATED  Final      Susceptibility   Group b strep(s.agalactiae)isolated - MIC*    CLINDAMYCIN RESISTANT Resistant     AMPICILLIN <=0.25 SENSITIVE Sensitive     ERYTHROMYCIN 2 RESISTANT Resistant     VANCOMYCIN 0.5 SENSITIVE Sensitive     CEFTRIAXONE <=0.12 SENSITIVE Sensitive     LEVOFLOXACIN 1 SENSITIVE Sensitive     PENICILLIN <=0.06 SENSITIVE Sensitive     * GROUP B STREP(S.AGALACTIAE)ISOLATED  Blood Culture ID Panel (Reflexed)     Status: Abnormal   Collection Time: 10/17/22  8:15 PM  Result Value Ref Range Status   Enterococcus faecalis NOT DETECTED NOT DETECTED Final   Enterococcus Faecium NOT DETECTED NOT DETECTED Final   Listeria  monocytogenes NOT DETECTED NOT DETECTED Final   Staphylococcus species NOT DETECTED NOT DETECTED Final   Staphylococcus aureus (BCID) NOT DETECTED NOT DETECTED Final   Staphylococcus epidermidis NOT DETECTED NOT DETECTED Final   Staphylococcus lugdunensis NOT DETECTED NOT DETECTED Final   Streptococcus species DETECTED (A) NOT DETECTED Final    Comment: CRITICAL RESULT CALLED TO, READ BACK BY AND VERIFIED WITH: S. MOODY PHARMD, AT 1331 10/18/22 D. VANHOOK    Streptococcus agalactiae DETECTED (A) NOT DETECTED Final    Comment: CRITICAL RESULT CALLED TO, READ BACK BY AND VERIFIED WITH: S. MOODY PHARMD, AT 1331 10/18/22 D. VANHOOK    Streptococcus pneumoniae NOT DETECTED NOT DETECTED Final   Streptococcus pyogenes NOT DETECTED NOT DETECTED Final   A.calcoaceticus-baumannii NOT DETECTED NOT DETECTED Final   Bacteroides fragilis NOT DETECTED NOT DETECTED Final   Enterobacterales NOT DETECTED NOT DETECTED Final   Enterobacter cloacae complex NOT DETECTED NOT DETECTED Final   Escherichia coli NOT DETECTED NOT DETECTED Final   Klebsiella aerogenes NOT DETECTED NOT DETECTED Final   Klebsiella oxytoca NOT DETECTED NOT DETECTED Final   Klebsiella pneumoniae NOT DETECTED NOT DETECTED Final   Proteus species NOT DETECTED NOT DETECTED Final   Salmonella species NOT DETECTED NOT DETECTED Final   Serratia marcescens NOT DETECTED NOT DETECTED Final   Haemophilus influenzae NOT DETECTED NOT DETECTED Final   Neisseria meningitidis NOT DETECTED NOT DETECTED Final   Pseudomonas aeruginosa NOT DETECTED NOT DETECTED Final   Stenotrophomonas maltophilia NOT DETECTED NOT DETECTED Final   Candida albicans NOT DETECTED NOT DETECTED Final   Candida auris NOT DETECTED NOT DETECTED Final   Candida glabrata NOT DETECTED NOT DETECTED Final   Candida krusei NOT DETECTED NOT DETECTED Final   Candida parapsilosis NOT DETECTED NOT DETECTED Final   Candida tropicalis NOT DETECTED NOT DETECTED Final   Cryptococcus  neoformans/gattii NOT DETECTED NOT DETECTED Final    Comment: Performed at [facility], [address], [place], [place] [address]  Culture, blood (Routine X 2) w Reflex to ID Panel     Status: None   Collection Time: 10/20/22 11:52 AM   Specimen: BLOOD LEFT ARM  Result Value Ref Range Status   Specimen Description BLOOD LEFT ARM  Final   Special Requests   Final  BOTTLES DRAWN AEROBIC AND ANAEROBIC Blood Culture adequate volume   Culture   Final    NO GROWTH 5 DAYS Performed at [facility], [address], [place], [place] [address]    Report Status 10/25/2022 FINAL  Final  Culture, blood (Routine X 2) w Reflex to ID Panel     Status: None   Collection Time: 10/20/22 11:52 AM   Specimen: BLOOD LEFT HAND  Result Value Ref Range Status   Specimen Description BLOOD LEFT HAND  Final   Special Requests   Final    BOTTLES DRAWN AEROBIC AND ANAEROBIC Blood Culture adequate volume   Culture   Final    NO GROWTH 5 DAYS Performed at [facility], [address], [place], [place] [address]    Report Status 10/25/2022 FINAL  Final     Radiology Studies: DG CHEST PORT 1 VIEW  Result Date: 10/27/2022 CLINICAL DATA:  Shortness of breath. EXAM: PORTABLE CHEST 1 VIEW COMPARISON:  Chest x-ray 10/18/2022. FINDINGS: Low lung volumes. Improved interstitial opacities. Clear lungs. No consolidation. No visible pleural effusions or pneumothorax. Cardiomediastinal silhouette is similar to prior and accentuated by technique. IMPRESSION: No evidence of acute cardiopulmonary disease. Improved/resolved probable interstitial edema. Electronically Signed   By: Feliberto Harts M.D.   On: 10/27/2022 09:30   CT MAXILLOFACIAL WO CONTRAST  Result Date: 10/26/2022 CLINICAL DATA:  Bacteremia EXAM: CT MAXILLOFACIAL WITHOUT CONTRAST TECHNIQUE: Multidetector CT imaging of the maxillofacial structures was performed. Multiplanar CT image reconstructions were also generated. RADIATION DOSE REDUCTION:  This exam was performed according to the departmental dose-optimization program which includes automated exposure control, adjustment of the mA and/or kV according to patient size and/or use of iterative reconstruction technique. COMPARISON:  None Available. FINDINGS: Osseous: The facial bones are intact. No mandibular dislocation. There are numerous absent maxillary teeth. The posterior right maxillary premolar appears eroded. There is periodontal disease in maxillary resorption noted with exposure of the roots of the maxillary dentition. Superimposed dental caries noted with multiple erosions within the mid residual maxillary molars. Periodontal disease and maxillary resorption similarly exposes the roots of the mandibular dentition. No periapical lucency identified. Orbits: Negative. No traumatic or inflammatory finding. Sinuses: Clear. Soft tissues: Negative. Limited intracranial: No significant or unexpected finding. IMPRESSION: 1. Periodontal disease and alveolar resorption with exposure of the roots of the maxillary and mandibular dentition. Superimposed dental caries with multiple erosions within the residual maxillary molars. No periapical lucency identified. Electronically Signed   By: Helyn Numbers M.D.   On: 10/26/2022 00:42    Scheduled Meds:  ammonium lactate   Topical BID   amoxicillin  1,000 mg Oral Q8H   [START ON 11/03/2022] amoxicillin  500 mg Oral Q12H   carvedilol  12.5 mg Oral BID WC   cloNIDine  0.2 mg Oral BID   doxazosin  4 mg Oral Daily   losartan  100 mg Oral Daily   lubiprostone  24 mcg Oral BID   magnesium oxide  400 mg Oral BID   omega-3 acid ethyl esters  1 g Oral Daily   potassium chloride  20 mEq Oral Daily   warfarin  5 mg Oral q1600   Warfarin - Pharmacist Dosing Inpatient   Does not apply q1600   Continuous Infusions:  sodium phosphate 15 mmol in dextrose 5 % 250 mL infusion 15 mmol (10/27/22 1050)    LOS: 10 days   Marguerita Merles, DO [facility] Available via Epic secure chat 7am-7pm After  these hours, please refer to coverage provider listed on [email or web address] 10/27/2022, 3:17 PM

## 2022-10-27 NOTE — Plan of Care (Signed)

## 2022-10-28 DIAGNOSIS — J189 Pneumonia, unspecified organism: Secondary | ICD-10-CM | POA: Diagnosis not present

## 2022-10-28 DIAGNOSIS — I77819 Aortic ectasia, unspecified site: Secondary | ICD-10-CM | POA: Diagnosis not present

## 2022-10-28 DIAGNOSIS — Z515 Encounter for palliative care: Secondary | ICD-10-CM | POA: Diagnosis not present

## 2022-10-28 DIAGNOSIS — Z7189 Other specified counseling: Secondary | ICD-10-CM | POA: Diagnosis not present

## 2022-10-28 DIAGNOSIS — A401 Sepsis due to streptococcus, group B: Secondary | ICD-10-CM | POA: Diagnosis not present

## 2022-10-28 DIAGNOSIS — R7881 Bacteremia: Secondary | ICD-10-CM | POA: Diagnosis not present

## 2022-10-28 LAB — CBC WITH DIFFERENTIAL/PLATELET
Abs Immature Granulocytes: 0.05 10*3/uL (ref 0.00–0.07)
Basophils Absolute: 0 10*3/uL (ref 0.0–0.1)
Basophils Relative: 0 %
Eosinophils Absolute: 0.1 10*3/uL (ref 0.0–0.5)
Eosinophils Relative: 1 %
HCT: 33.3 % — ABNORMAL LOW (ref 39.0–52.0)
Hemoglobin: 10.7 g/dL — ABNORMAL LOW (ref 13.0–17.0)
Immature Granulocytes: 1 %
Lymphocytes Relative: 15 %
Lymphs Abs: 1.1 10*3/uL (ref 0.7–4.0)
MCH: 29.1 pg (ref 26.0–34.0)
MCHC: 32.1 g/dL (ref 30.0–36.0)
MCV: 90.5 fL (ref 80.0–100.0)
Monocytes Absolute: 0.6 10*3/uL (ref 0.1–1.0)
Monocytes Relative: 8 %
Neutro Abs: 5.7 10*3/uL (ref 1.7–7.7)
Neutrophils Relative %: 75 %
Platelets: 324 10*3/uL (ref 150–400)
RBC: 3.68 MIL/uL — ABNORMAL LOW (ref 4.22–5.81)
RDW: 14.1 % (ref 11.5–15.5)
WBC: 7.7 10*3/uL (ref 4.0–10.5)
nRBC: 0 % (ref 0.0–0.2)

## 2022-10-28 LAB — COMPREHENSIVE METABOLIC PANEL
ALT: 7 U/L (ref 0–44)
AST: 13 U/L — ABNORMAL LOW (ref 15–41)
Albumin: 2.5 g/dL — ABNORMAL LOW (ref 3.5–5.0)
Alkaline Phosphatase: 92 U/L (ref 38–126)
Anion gap: 9 (ref 5–15)
BUN: 20 mg/dL (ref 8–23)
CO2: 25 mmol/L (ref 22–32)
Calcium: 10 mg/dL (ref 8.9–10.3)
Chloride: 100 mmol/L (ref 98–111)
Creatinine, Ser: 0.69 mg/dL (ref 0.61–1.24)
GFR, Estimated: >60 mL/min (ref >60)
Glucose, Bld: 131 mg/dL — ABNORMAL HIGH (ref 70–99)
Potassium: 3.7 mmol/L (ref 3.5–5.1)
Sodium: 134 mmol/L — ABNORMAL LOW (ref 135–145)
Total Bilirubin: 0.6 mg/dL (ref 0.3–1.2)
Total Protein: 6.3 g/dL — ABNORMAL LOW (ref 6.5–8.1)

## 2022-10-28 LAB — PROTIME-INR
INR: 1.9 — ABNORMAL HIGH (ref 0.8–1.2)
Prothrombin Time: 22 s — ABNORMAL HIGH (ref 11.4–15.2)

## 2022-10-28 LAB — VITAMIN B12: Vitamin B-12: 149 pg/mL — ABNORMAL LOW (ref 180–914)

## 2022-10-28 LAB — IRON AND TIBC
Iron: 19 ug/dL — ABNORMAL LOW (ref 45–182)
Saturation Ratios: 6 % — ABNORMAL LOW (ref 17.9–39.5)
TIBC: 301 ug/dL (ref 250–450)
UIBC: 282 ug/dL

## 2022-10-28 LAB — RETICULOCYTES
Immature Retic Fract: 18.8 % — ABNORMAL HIGH (ref 2.3–15.9)
RBC.: 3.6 MIL/uL — ABNORMAL LOW (ref 4.22–5.81)
Retic Count, Absolute: 117 10*3/uL (ref 19.0–186.0)
Retic Ct Pct: 3.3 % — ABNORMAL HIGH (ref 0.4–3.1)

## 2022-10-28 LAB — PHOSPHORUS: Phosphorus: 2.3 mg/dL — ABNORMAL LOW (ref 2.5–4.6)

## 2022-10-28 LAB — MAGNESIUM: Magnesium: 2 mg/dL (ref 1.7–2.4)

## 2022-10-28 LAB — FERRITIN: Ferritin: 141 ng/mL (ref 24–336)

## 2022-10-28 LAB — FOLATE: Folate: 5.4 ng/mL — ABNORMAL LOW (ref >5.9)

## 2022-10-28 MED ORDER — VITAMIN B-12 1000 MCG PO TABS
1000.0000 ug | ORAL_TABLET | Freq: Every day | ORAL | Status: DC
  Administered 2022-10-28 – 2022-10-30 (×3): 1000 ug via ORAL
  Filled 2022-10-28 (×3): qty 1

## 2022-10-28 MED ORDER — SODIUM CHLORIDE 0.9 % IV BOLUS
500.0000 mL | Freq: Once | INTRAVENOUS | Status: AC
  Administered 2022-10-28: 500 mL via INTRAVENOUS

## 2022-10-28 MED ORDER — SODIUM PHOSPHATES 45 MMOLE/15ML IV SOLN
15.0000 mmol | Freq: Once | INTRAVENOUS | Status: AC
  Administered 2022-10-28: 15 mmol via INTRAVENOUS
  Filled 2022-10-28: qty 5

## 2022-10-28 MED ORDER — POLYSACCHARIDE IRON COMPLEX 150 MG PO CAPS
150.0000 mg | ORAL_CAPSULE | Freq: Every day | ORAL | Status: DC
  Administered 2022-10-28 – 2022-10-30 (×3): 150 mg via ORAL
  Filled 2022-10-28 (×3): qty 1

## 2022-10-28 MED ORDER — WARFARIN SODIUM 7.5 MG PO TABS
7.5000 mg | ORAL_TABLET | Freq: Once | ORAL | Status: AC
  Administered 2022-10-28: 7.5 mg via ORAL
  Filled 2022-10-28: qty 1

## 2022-10-28 MED ORDER — FOLIC ACID 1 MG PO TABS
1.0000 mg | ORAL_TABLET | Freq: Every day | ORAL | Status: DC
  Administered 2022-10-28 – 2022-10-30 (×3): 1 mg via ORAL
  Filled 2022-10-28 (×3): qty 1

## 2022-10-28 NOTE — Progress Notes (Signed)
PROGRESS NOTE    [name]  ZOX:[id number] DOB: 09/14/1950 DOA: 10/17/2022 PCP: [name], MD   Brief Narrative:  The patient is a 72 year old Caucasian male with a past medical history significant for but not to HTN, moderate aortic stenosis, mitral valve prolapse, atrial fibrillation, hyperlipidemia, OSA not on CPAP, Obesity as well as other comorbidities who earlier in the week developed what he does describes as a viral illness with shakes and chills but then subsequently resolved.  He has not become weaker and weaker since and eventually fell but did not hit his head and there is no loss of consciousness.  Given his fall he is brought to the ED after he called 911 and in the ED he was found to be febrile.  Chest x-ray was done and showed cardiomegaly with pulmonary vascular congestion and possible edema versus infiltrate.  Lactic acid level was elevated at 5 and he was given IV Rocephin and azithromycin.  He is admitted for further workup and lactic acid started downtrending and WBC is now resolved but blood cultures came back positive for Streptococcus.  He is now being treated for streptococcal bacteremia and he was placed on IV Cefazolin and further workup reveals likely the source of the infection is suspected to be dental with peridental disease with dental caries and multiple erosions.  His IV cefazolin has now been changed to oral amoxicillin to bridge maxillofacial evaluation and possible dental extraction.  Given his deconditioning PT OT have evaluated and recommending SNF and he is now agreeable but was at first hesitant to go to rehab. He is medically stable to D/C and will need follow up with PCP, ID, Palliative Care Medicine and Cardiology in the outpatient setting.   Assessment and Plan:  Sepsis Secondary to Streptococcal bacteremia and community-acquired pneumonia:  -Vitals are stable this a.m. -Leukocytosis resolved.  TTE and TEE negative for endocarditis -ID  recommendations: Continue IV cefazolin 2 g every 8 hours but now changing to oral Amoxicillin 1000 mg po q8h -Continue Monitor Vitals -ID ordered imaging of teeth and it showed "Periodontal disease and alveolar resorption with exposure of the roots of the maxillary and mandibular dentition. Superimposed dental caries with multiple erosions within the residual maxillary molars. No periapical lucency identified." -ID also pending referral to [facility] for evaluation of his dental caries and anticipating extractions and likely will need a maxillofacial surgeon -PT OT recommending SNF and Anticipating D/C when Bed is avavilable and Insurance Auth obtained  -Wants to speak with Palliative Care so Consult has been placed and goals of care being elucidated and patient will continue to manage current infection with current antibiotics and is leaning towards not having a dental extractions.  Palliative follow-up at SNF and patient's long-term goals are to be comfortable and not escalate care   Dyspnea and Hypoxia in the setting of Streptococcal PNA -Improved, now on room air SpO2: 96 % O2 Flow Rate (L/min): 2 L/min -Give incentive spirometry -Continuous Pulse Oximetry and Maintain O2 Sat's greater than 90% -Continue Supplemental O2 via [place] and [place] O2 as Tolerated -Will need an Ambulatory Home O2 Screen prior to D/C -Repeat CXR yesterday AM done and showed "No evidence of acute cardiopulmonary disease. Improved/resolved probable interstitial edema."   Normocytic Anemia -Chronic. Hgb/Hct Trend: Recent Labs  Lab 10/21/22 0911 10/22/22 8119 10/23/22 0555 10/25/22 0431 10/26/22 0537 10/27/22 0014 10/28/22 0854  HGB 10.4* 10.5* 10.1* 10.4* 10.8* 10.6* 10.7*  HCT 31.1* 31.7* 30.9* 32.2* 33.3* 32.6* 33.3*  MCV 88.9 89.3 91.4 91.5 92.0 93.1 90.5  -Checked Anemia Panel and showed an iron level of 19, UIBC of 282, TIBC of 301, saturation ratios of 6%, ferritin level of 141, folate level 5.4, vitamin B12 level  of 149 -Will start p.o. Niferex, p.o. folic acid supplementation and B12 supplementation -Continue to Monitor for S/Sx of Bleeding; No overt Bleeding noted -Repeat CBC in the AM  Hypokalemia, improved -Patient's K+ Level Trend: Recent Labs  Lab 10/21/22 0911 10/22/22 0651 10/23/22 0555 10/25/22 0431 10/26/22 0537 10/27/22 0014 10/28/22 0854  K 3.0* 4.4 4.9 4.0 4.3 4.5 3.7  -Continue to Monitor and Replete as Necessary -Repeat CMP in the AM    Obstructive Sleep Apnea -Give incentive spirometry -Does not use CPAP at home. -Would benefit from CPAP at bedtime.   Chronic Atrial Fibrillation -Rate controlled -CHA?DS?-VASc of at least 3. -INR is therapeutic at 1.9 this a.m. -Continue Warfarin per Pharmacy and he will receive a 7.5 mg dose today -Follow-up PT/INR -On Carvedilol 12.5 mg po BID -Continue to Monitor on Telemetry  Hyponatremia -In the setting of Hydrochlorothiazide use -Will Hold and Discontinue Hydrochlorothiazide -Na+ Trend:  Recent Labs  Lab 10/21/22 0911 10/22/22 0651 10/23/22 0555 10/25/22 0431 10/26/22 0537 10/27/22 0014 10/28/22 0854  NA 135 137 134* 134* 133* 132* 134*  -Replete with IV Sodium Phos 15 mmol  -Continue to Monitor and Trend and repeat CMP in the AM  Hypophosphatemia -Phos Level Trend: Recent Labs  Lab 10/21/22 0907 10/26/22 0537 10/27/22 0014 10/28/22 0854  PHOS 1.5* 2.9 2.3* 2.3*  -Replete with IV Sodium Phos 15 mmol again  -Continue to Monitor and Replete as Necessary -Repeat Phos Level in the AM   Nonrheumatic aortic valve stenosis -Continue Coumadin   CAD (coronary artery disease) -No acute chest pain symptoms -On warfarin per pharmacy. -Continue Losartan 100 mg p.o. daily and Carvedilol 12.5 mg po BID -Continue to Monitor and Trend   HTN (Hypertension) -Mildly elevated blood pressure. -Continue ARB with Losartan 100 mg po Daily, Clonidine 0.2 mg po BID, Carvedilol 12.5 mg po BID, and Doxazosin 4 mg po Daily   -Will Discontinue Hydrochlorothiazide 12.5 mg p.o. daily due to Hyponatremia -Continue to Monitor BP per Protocol and Last BP reading was on the softer side 103/66   Aortic Dilatation Moderate aortic stenosis Mitral regurgitation -EF 60-65% no RWMA, small pericardial effusion,  -Mitral valve is normal and Aortic valve is normal  -Mild dilatation of aortic root and ascending aorta no interatrial shunt.  -Needs Cardiology Follow up at DC    Dyslipidemia -Currently not on medical therapy except Omega-3 Acid Ethyl Esters 1 gram po Daily -Follow-up with PCP.  Constipation -C/w Senna-docusate 1 tab po at bedtime prn and will add Bisacodyl 10 mg RC Dailyprn Moderate Constipation; Will try another Suppository  -C/w Lubiprostone 24 mcg po BID -Has an Adverse Effect to Miralax so will avoid    Physical Debility/Generalized Deconditioning:  -PT recommended home health as patient was declining rehab.   -Unable to take care of himself and Likely will need rehab and now PT/OT recommending SNF and Nephew and Patient in agreement  Hypoalbuminemia -Patient's Albumin Trend: Recent Labs  Lab 10/17/22 1929 10/26/22 0537 10/27/22 0014 10/28/22 0854  ALBUMIN 2.9* 2.4* 2.4* 2.5*  -Continue to Monitor and Trend and repeat CMP in the AM  Obesity -Complicates overall prognosis and care -Estimated body mass index is 33 kg/m as calculated from the following:   Height as of this encounter: 5\' 10"  (  1.778 m).   Weight as of this encounter: 104.3 kg.  -Weight Loss and Dietary Counseling given   DVT prophylaxis:  warfarin (COUMADIN) tablet 7.5 mg    Code Status: DNR Family Communication: Discussed with Nephew at bedside  Disposition Plan:  Level of care: Telemetry Medical Status is: Inpatient Remains inpatient appropriate because: Medically stable to D/C to SNF and awaiting Insurance Auth and bed availability    Consultants:  Palliative Care Medicine Infectious Diseases  Cardiology for TEE    Procedures:  TRANSTHORACIC ECHOCARDIOGRAM IMPRESSIONS     1. Left ventricular ejection fraction, by estimation, is 60 to 65%. The  left ventricle has normal function. The left ventricle has no regional  wall motion abnormalities. There is mild concentric left ventricular  hypertrophy. Left ventricular diastolic  function could not be evaluated. Elevated left atrial pressure.   2. Right ventricular systolic function is normal. The right ventricular  size is normal.   3. A small pericardial effusion is present.   4. The mitral valve is normal in structure. Mild mitral valve  regurgitation. No evidence of mitral stenosis.   5. The aortic valve is normal in structure. Aortic valve regurgitation is  trivial. Moderate aortic valve stenosis.   6. Aortic dilatation noted. There is mild dilatation of the aortic root,  measuring 39 mm. There is mild dilatation of the ascending aorta,  measuring 41 mm.   7. The inferior vena cava is dilated in size with >50% respiratory  variability, suggesting right atrial pressure of 8 mmHg.   FINDINGS   Left Ventricle: Left ventricular ejection fraction, by estimation, is 60  to 65%. The left ventricle has normal function. The left ventricle has no  regional wall motion abnormalities. The left ventricular internal cavity  size was normal in size. There is   mild concentric left ventricular hypertrophy. Left ventricular diastolic  function could not be evaluated due to atrial fibrillation. Left  ventricular diastolic function could not be evaluated. Elevated left  atrial pressure.   Right Ventricle: The right ventricular size is normal. Right ventricular  systolic function is normal.   Left Atrium: Left atrial size was normal in size.   Right Atrium: Right atrial size was normal in size.   Pericardium: A small pericardial effusion is present.   Mitral Valve: The mitral valve is normal in structure. Mild mitral valve  regurgitation. No evidence  of mitral valve stenosis. MV peak gradient, 7.1  mmHg. The mean mitral valve gradient is 4.0 mmHg.   Tricuspid Valve: The tricuspid valve is normal in structure. Tricuspid  valve regurgitation is trivial. No evidence of tricuspid stenosis.   Aortic Valve: The aortic valve is normal in structure. Aortic valve  regurgitation is trivial. Aortic regurgitation PHT measures 684 msec.  Moderate aortic stenosis is present. Aortic valve mean gradient measures  24.0 mmHg. Aortic valve peak gradient  measures 44.7 mmHg. Aortic valve area, by VTI measures 1.49 cm.   Pulmonic Valve: The pulmonic valve was normal in structure. Pulmonic valve  regurgitation is trivial. No evidence of pulmonic stenosis.   Aorta: Aortic dilatation noted. There is mild dilatation of the aortic  root, measuring 39 mm. There is mild dilatation of the ascending aorta,  measuring 41 mm.   Venous: The inferior vena cava is dilated in size with greater than 50%  respiratory variability, suggesting right atrial pressure of 8 mmHg.   IAS/Shunts: No atrial level shunt detected by color flow Doppler.     LEFT VENTRICLE  PLAX 2D  LVIDd:         5.10 cm     Diastology  LVIDs:         3.60 cm     LV e' medial:    7.84 cm/s  LV PW:         1.00 cm     LV E/e' medial:  17.0  LV IVS:        1.10 cm     LV e' lateral:   8.33 cm/s  LVOT diam:     1.90 cm     LV E/e' lateral: 16.0  LV SV:         93  LV SV Index:   41  LVOT Area:     2.84 cm    LV Volumes (MOD)  LV vol d, MOD A2C: 94.2 ml  LV vol d, MOD A4C: 95.3 ml  LV vol s, MOD A2C: 43.1 ml  LV vol s, MOD A4C: 42.5 ml  LV SV MOD A2C:     51.1 ml  LV SV MOD A4C:     95.3 ml  LV SV MOD BP:      54.4 ml   RIGHT VENTRICLE             IVC  RV Basal diam:  4.00 cm     IVC diam: 2.80 cm  RV S prime:     12.47 cm/s  TAPSE (M-mode): 2.2 cm   LEFT ATRIUM             Index        RIGHT ATRIUM           Index  LA diam:        4.30 cm 1.90 cm/m   RA Area:     20.90 cm  LA  Vol (A2C):   77.1 ml 34.05 ml/m  RA Volume:   56.40 ml  24.91 ml/m  LA Vol (A4C):   69.0 ml 30.47 ml/m  LA Biplane Vol: 73.9 ml 32.63 ml/m   AORTIC VALVE  AV Area (Vmax):    1.40 cm  AV Area (Vmean):   1.52 cm  AV Area (VTI):     1.49 cm  AV Vmax:           334.25 cm/s  AV Vmean:          230.500 cm/s  AV VTI:            0.625 m  AV Peak Grad:      44.7 mmHg  AV Mean Grad:      24.0 mmHg  LVOT Vmax:         165.00 cm/s  LVOT Vmean:        123.667 cm/s  LVOT VTI:          0.328 m  LVOT/AV VTI ratio: 0.52  AI PHT:            684 msec    AORTA  Ao Root diam: 3.90 cm  Ao Asc diam:  4.10 cm   MITRAL VALVE                TRICUSPID VALVE  MV Area (PHT): 4.22 cm     TR Peak grad:   29.8 mmHg  MV Area VTI:   4.02 cm     TR Vmax:        273.00 cm/s  MV Peak grad:  7.1 mmHg  MV Mean grad:  4.0 mmHg     SHUNTS  MV Vmax:       1.33 m/s     Systemic VTI:  0.33 m  MV Vmean:      93.0 cm/s    Systemic Diam: 1.90 cm  MV Decel Time: 180 msec  MV E velocity: 133.67 cm/s   TRANSESOPHAGEAL ECHOCARDIOGRAM     Brief TEE Note   LVEF 60-65% 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 No LA/LAA thrombus or masses No evidence of endocarditis Trivial pericardial effusion Small pleural effusion       Antimicrobials:  Anti-infectives (From admission, onward)    Start     Dose/Rate Route Frequency Ordered Stop   11/03/22 1000  amoxicillin (AMOXIL) capsule 500 mg        500 mg Oral Every 12 hours 10/27/22 0910 11/10/22 0959   10/26/22 1400  amoxicillin (AMOXIL) capsule 1,000 mg        1,000 mg Oral Every 8 hours 10/26/22 1136 11/02/22 2359   10/19/22 1400  ceFAZolin (ANCEF) IVPB 2g/100 mL premix  Status:  Discontinued        2 g 200 mL/hr over 30 Minutes Intravenous Every 8 hours 10/18/22 1441 10/26/22 1136   10/19/22 1100  amoxicillin (AMOXIL) capsule 500 mg        500 mg Oral  Once 10/19/22 1014  10/19/22 1225   10/18/22 1400  cefTRIAXone (ROCEPHIN) 2 g in sodium chloride 0.9 % 100 mL IVPB  Status:  Discontinued        2 g 200 mL/hr over 30 Minutes Intravenous Every 24 hours 10/18/22 1054 10/18/22 1441   10/17/22 2045  azithromycin (ZITHROMAX) 500 mg in sodium chloride 0.9 % 250 mL IVPB  Status:  Discontinued        500 mg 250 mL/hr over 60 Minutes Intravenous Every 24 hours 10/17/22 2039 10/18/22 1441   10/17/22 2045  cefTRIAXone (ROCEPHIN) 1 g in sodium chloride 0.9 % 100 mL IVPB  Status:  Discontinued        1 g 200 mL/hr over 30 Minutes Intravenous Every 24 hours 10/17/22 2039 10/18/22 1054       Subjective: Seen and examined at bedside awoken from sleep and he feels okay and states that he had a bowel movement yesterday after the suppository but feels like he could use another 1 today.  Continues to feel uncomfortable.  No nausea or vomiting.  Denies any lightheadedness or dizziness.  No other concerns or complaints at this time.  Objective: Vitals:   10/28/22 0725 10/28/22 1547 10/28/22 1549 10/28/22 1604  BP: 133/80 96/83 (!) 89/65 98/70  Pulse: 92 61 (!) 52   Resp: 18     Temp: (!) 97.4 F (36.3 C) 98.2 F (36.8 C)    TempSrc: Oral Oral    SpO2: 96%     Weight:      Height:        Intake/Output Summary (Last 24 hours) at 10/28/2022 1613 Last data filed at 10/28/2022 1436 Gross per 24 hour  Intake --  Output 3000 ml  Net -3000 ml   Filed Weights   10/18/22 0448 10/26/22 0600 10/28/22 0519  Weight: 109.9 kg 109 kg 104.3 kg   Examination: Physical Exam:  Constitutional: WN/WD obese Caucasian male in no acute distress and awoken from sleep Respiratory: Diminished to auscultation bilaterally, no wheezing, rales, rhonchi or crackles. Normal respiratory effort and patient is not  tachypenic. No accessory muscle use.  Unlabored breathing Cardiovascular: RRR, no murmurs / rubs / gallops. S1 and S2 auscultated.  Trace extremity edema Abdomen: Soft, non-tender,  distended secondary to body habitus. Bowel sounds positive.  GU: Deferred. Musculoskeletal: No clubbing / cyanosis of digits/nails. No joint deformity upper and lower extremities.  Neurologic: CN 2-12 grossly intact with no focal deficits.  Romberg sign and cerebellar reflexes not assessed.  Psychiatric: Normal judgment and insight. Alert and oriented x 3. Normal mood and appropriate affect.   Data Reviewed: I have personally reviewed following labs and imaging studies  CBC: Recent Labs  Lab 10/23/22 0555 10/25/22 0431 10/26/22 0537 10/27/22 0014 10/28/22 0854  WBC 9.5 7.4 8.3 9.3 7.7  NEUTROABS  --   --  5.7 6.8 5.7  HGB 10.1* 10.4* 10.8* 10.6* 10.7*  HCT 30.9* 32.2* 33.3* 32.6* 33.3*  MCV 91.4 91.5 92.0 93.1 90.5  PLT 291 330 332 338 324   Basic Metabolic Panel: Recent Labs  Lab 10/23/22 0555 10/25/22 0431 10/26/22 0537 10/27/22 0014 10/28/22 0854  NA 134* 134* 133* 132* 134*  K 4.9 4.0 4.3 4.5 3.7  CL 101 100 100 101 100  CO2 28 26 25 24 25   GLUCOSE 123* 105* 104* 119* 131*  BUN 16 22 19 22 20   CREATININE 1.10 0.88 0.73 0.85 0.69  CALCIUM 10.2 10.3 10.2 10.3 10.0  MG  --  2.3 2.2 2.1 2.0  PHOS  --   --  2.9 2.3* 2.3*   GFR: Estimated Creatinine Clearance: 100.9 mL/min (by C-G formula based on SCr of 0.69 mg/dL). Liver Function Tests: Recent Labs  Lab 10/26/22 0537 10/27/22 0014 10/28/22 0854  AST 17 18 13*  ALT 7 9 7   ALKPHOS 81 83 92  BILITOT 0.4 0.5 0.6  PROT 5.9* 6.0* 6.3*  ALBUMIN 2.4* 2.4* 2.5*   No results for input(s): "LIPASE", "AMYLASE" in the last 168 hours. No results for input(s): "AMMONIA" in the last 168 hours. Coagulation Profile: Recent Labs  Lab 10/23/22 0555 10/24/22 0434 10/26/22 0537 10/27/22 0909 10/28/22 0854  INR 2.6* 2.4* 2.6* 2.3* 1.9*   Cardiac Enzymes: No results for input(s): "CKTOTAL", "CKMB", "CKMBINDEX", "TROPONINI" in the last 168 hours. BNP (last 3 results) No results for input(s): "PROBNP" in the last 8760  hours. HbA1C: No results for input(s): "HGBA1C" in the last 72 hours. CBG: Recent Labs  Lab 10/23/22 0811  GLUCAP 164*   Lipid Profile: No results for input(s): "CHOL", "HDL", "LDLCALC", "TRIG", "CHOLHDL", "LDLDIRECT" in the last 72 hours. Thyroid Function Tests: No results for input(s): "TSH", "T4TOTAL", "FREET4", "T3FREE", "THYROIDAB" in the last 72 hours. Anemia Panel: Recent Labs    10/28/22 0854  VITAMINB12 149*  FOLATE 5.4*  FERRITIN 141  TIBC 301  IRON 19*  RETICCTPCT 3.3*   Sepsis Labs: No results for input(s): "PROCALCITON", "LATICACIDVEN" in the last 168 hours.  Recent Results (from the past 240 hour(s))  Culture, blood (Routine X 2) w Reflex to ID Panel     Status: None   Collection Time: 10/20/22 11:52 AM   Specimen: BLOOD LEFT ARM  Result Value Ref Range Status   Specimen Description BLOOD LEFT ARM  Final   Special Requests   Final    BOTTLES DRAWN AEROBIC AND ANAEROBIC Blood Culture adequate volume   Culture   Final    NO GROWTH 5 DAYS Performed at [facility], [address] [place] [address], [place], [place] [address]    Report Status 10/25/2022 FINAL  Final  Culture, blood (Routine X 2) w Reflex to ID Panel     Status: None   Collection Time: 10/20/22 11:52 AM   Specimen: BLOOD LEFT HAND  Result Value Ref Range Status   Specimen Description BLOOD LEFT HAND  Final   Special Requests   Final    BOTTLES DRAWN AEROBIC AND ANAEROBIC Blood Culture adequate volume   Culture   Final    NO GROWTH 5 DAYS Performed at [facility], [address], [place], [place] [address]    Report Status 10/25/2022 FINAL  Final    Radiology Studies: DG CHEST PORT 1 VIEW  Result Date: 10/27/2022 CLINICAL DATA:  Shortness of breath. EXAM: PORTABLE CHEST 1 VIEW COMPARISON:  Chest x-ray 10/18/2022. FINDINGS: Low lung volumes. Improved interstitial opacities. Clear lungs. No consolidation. No visible pleural effusions or pneumothorax. Cardiomediastinal silhouette is similar to  prior and accentuated by technique. IMPRESSION: No evidence of acute cardiopulmonary disease. Improved/resolved probable interstitial edema. Electronically Signed   By: Feliberto Harts M.D.   On: 10/27/2022 09:30    Scheduled Meds:  ammonium lactate   Topical BID   amoxicillin  1,000 mg Oral Q8H   [START ON 11/03/2022] amoxicillin  500 mg Oral Q12H   carvedilol  12.5 mg Oral BID WC   cloNIDine  0.2 mg Oral BID   vitamin B-12  1,000 mcg Oral Daily   doxazosin  4 mg Oral Daily   folic acid  1 mg Oral Daily   iron polysaccharides  150 mg Oral Daily   losartan  100 mg Oral Daily   lubiprostone  24 mcg Oral BID   magnesium oxide  400 mg Oral BID   omega-3 acid ethyl esters  1 g Oral Daily   potassium chloride  20 mEq Oral Daily   warfarin  7.5 mg Oral ONCE-1600   Warfarin - Pharmacist Dosing Inpatient   Does not apply q1600   Continuous Infusions:  sodium phosphate 15 mmol in dextrose 5 % 250 mL infusion 15 mmol (10/28/22 1222)    LOS: 11 days   Marguerita Merles, DO [facility] Available via Epic secure chat 7am-7pm After these hours, please refer to coverage provider listed on [email or web address] 10/28/2022, 4:13 PM

## 2022-10-28 NOTE — TOC Transition Note (Signed)
Transition of Care [facility]) - CM/SW Discharge Note   Patient Details  Name: [name] MRN: [id number] Date of Birth: Aug 16, 1950  Transition of Care [facility]) CM/SW Contact:  Lebaron Bautch A [place], Theresia Majors Phone Number: 10/28/2022, 3:32 PM   Clinical Narrative:      CSW met with pt and son at bedside. Stated SNF choice was for [facility].   CSW informed facility. Outpatient palliative requested, Christiane Ha, pt's nephew stated he had spoken with facility and they would coordinate a palliative option through [facility].   Auth started with [facility] for ambulance and SNF request.  CSW left contact information for weekend social worker to get updates on authorization.        Patient Goals and CMS Choice      Discharge Placement                         Discharge Plan and Services Additional resources added to the After Visit Summary for                                       Social Determinants of Health (SDOH) Interventions SDOH Screenings   Food Insecurity: No Food Insecurity (10/18/2022)  Housing: Low Risk  (10/18/2022)  Transportation Needs: No Transportation Needs (10/18/2022)  Utilities: Not At Risk (10/18/2022)  Alcohol Screen: Low Risk  (03/21/2022)  Depression (PHQ2-9): Low Risk  (09/09/2022)  Financial Resource Strain: Low Risk  (03/21/2022)  Physical Activity: Insufficiently Active (03/21/2022)  Social Connections: Socially Isolated (03/21/2022)  Stress: No Stress Concern Present (03/21/2022)  Tobacco Use: Low Risk  (10/25/2022)     Readmission Risk Interventions    10/18/2022    4:24 PM  Readmission Risk Prevention Plan  Post Dischage Appt Complete  Medication Screening Complete  Transportation Screening Complete

## 2022-10-28 NOTE — Progress Notes (Signed)
PT Cancellation Note  Patient Details Name: [name] MRN: [id number] DOB: 1950/06/10   Cancelled Treatment:    Reason Eval/Treat Not Completed: Patient at procedure or test/unavailable. Pt meeting with palliative medicine.   Angelina Ok [facility] 10/28/2022, 11:17 AM Skip Mayer PT Acute [employer] [phone]

## 2022-10-28 NOTE — Consult Note (Signed)
Palliative Care Consult Note                                  Date: 10/28/2022   Patient Name: [name]  DOB: 1950/06/14  MRN: [id number]  Age / Sex: 72 y.o., male  PCP: [name], MD Referring Physician: Merlene Laughter, DO  Reason for Consultation: Establishing goals of care  HPI/Patient Profile: 72 y.o. male  with past medical history of HTN, moderate aortic stenosis, mitral valve prolapse, atrial fibrillation, hyperlipidemia, OSA not on CPAP, and morbid obesity admitted on 10/17/2022 by EMS for weakness and a fall after having what he calls a viral type illness w/ shakes and chills earlier that week.  Patient has since been treated for sepsis secondary to streptococcal bacteremia and community-acquired pneumonia.   Subjective:   I have reviewed medical records including EPIC notes, labs and imaging, assessed the patient and then met in the patient's room with the patient and his nephew [name] to discuss diagnosis prognosis, GOC, EOL wishes, disposition and options.  I introduced Palliative Medicine as specialized medical care for people living with serious illness. It focuses on providing relief from symptoms and stress of a serious illness. The goal is to improve quality of life for both the patient and the family.  Created space and opportunity for patient  and family to explore thoughts and feelings regarding current medical situation. Values and goals of care important to patient and family were attempted to be elicited.    Today's Discussion: Created space and opportunity for patient  and family to explore thoughts and feelings regarding current medical situation. Values and goals of care important to patient and family were attempted to be elicited.  Patient and his nephew understand the patient's chronic and acute conditions. The patient depends on his nephew Theodoro Grist to help him make decisions and to  support him. Prior to admission the patient was struggling to complete ADLs due to weakness and pain. He was able to take sponge baths and make himself microwave meals. They both note that his weakness has worsened this hospitalization.  The patient notes that his goals are to be independent and not be a burden to his nephew Theodoro Grist. He understands he cannot live independently with his level of weakness and that living independently is likely not a realistic goal. He also notes his quality of life is not very good. He would like to just get to a long term care facility and like his father who was in LTC "pray every night that he does not wake up." The patient is concerned about living completely dependent on others for his care.   We discussed the current discharge plan to go to rehab. We discussed the importance of pain and constipation management at rehab. Theodoro Grist is hopeful the patient will get some strength back. The patient discusses that he does not think he will get enough strength back to improve his quality of life. He is also leaning towards not having the dental procedure/follow-up. He says the procedure will not improve his quality of life and will "only potentially extend it, and for what purpose." He will go to rehab so his care does not burden Theodoro Grist.  We discussed the impp  A discussion was had today regarding advanced directives. Concepts specific to code status, artifical feeding and hydration, continued IV antibiotics and rehospitalization was had.  The difference between  a aggressive medical intervention path and a palliative comfort care path for this patient at this time was had. The MOST form was introduced, discussed, and completed a MOST form today. The patient and family outlined their wishes for the following treatment decisions:  Cardiopulmonary Resuscitation: Do Not Attempt Resuscitation (DNR/No CPR)  Medical Interventions: Comfort Measures: Keep clean, warm, and dry. Use medication  by any route, positioning, wound care, and other measures to relieve pain and suffering. Use oxygen, suction and manual treatment of airway obstruction as needed for comfort. Do not transfer to the hospital unless comfort needs cannot be met in current location.  Antibiotics: Determine use of limitation of antibiotics when infection occurs  IV Fluids: No IV fluids (provide other measures to ensure comfort)  Feeding Tube: No feeding tube    Discussed the importance of continued conversation with family and the medical providers regarding overall plan of care and treatment options, ensuring decisions are within the context of the patient's values and GOCs.  Questions and concerns were addressed. Hard Choices booklet left for review. The family was encouraged to call with questions or concerns. PMT will continue to support holistically.  Review of Systems  Constitutional:  Positive for fatigue.  Gastrointestinal:  Positive for constipation.  Genitourinary:  Positive for frequency.    Objective:   Primary Diagnoses: Present on Admission:  ATRIAL FIBRILLATION  Dyslipidemia  OBSTRUCTIVE SLEEP APNEA  Sepsis due to Streptococcus, group B (HCC)   Physical Exam HENT:     Head: Normocephalic and atraumatic.  Pulmonary:     Effort: Pulmonary effort is normal.  Skin:    General: Skin is warm and dry.  Neurological:     Mental Status: He is alert and oriented to person, place, and time.  Psychiatric:        Behavior: Behavior normal.        Thought Content: Thought content normal.     Vital Signs:  BP 133/80 (BP Location: Right Arm)   Pulse 92   Temp (!) 97.4 F (36.3 C) (Oral)   Resp 18   Ht 5\' 10"  (1.778 m)   Wt 104.3 kg   SpO2 96%   BMI 33.00 kg/m    Advanced Care Planning:   Existing Vynca/ACP Documentation: None  Primary Decision Maker: Patient is the primary decision maker. His HCPOA is his nephew Blake Ha "[name].  Code Status/Advance Care  Planning: DNR  Assessment & Plan:   SUMMARY OF RECOMMENDATIONS   DNR/DNI Manage current infection Patient leaning towards not having dental extractions Discharge to SNF for rehab Outpatient palliative at SNF Patient's long-term goals are to be comfortable and not escalate care PMT will continue to support  Discussed with: bedside RN and Dr. Marland Mcalpine   Thank you for allowing [place] to participate in the care of Gildardo Pounds PMT will continue to support holistically.  Time Total: 120 minutes   Signed by: Sarina Ser, NP Palliative Medicine Team  Team Phone # [phone] (Nights/Weekends)  10/28/2022, 11:57 AM

## 2022-10-28 NOTE — Plan of Care (Signed)

## 2022-10-28 NOTE — Progress Notes (Signed)
PT Cancellation Note  Patient Details Name: FREDYS VLASIC MRN: [id number] DOB: 09/28/1950   Cancelled Treatment:    Reason Eval/Treat Not Completed: Other (comment). Pt sleeping soundly and nephew reports busy day and now with soft BP. Will follow up later date.   Angelina Ok [facility] 10/28/2022, 3:56 PM Skip Mayer PT Acute [employer] [phone]

## 2022-10-28 NOTE — Progress Notes (Signed)
ANTICOAGULATION CONSULT NOTE - Follow Up Consult  Pharmacy Consult for Warfarin Indication: atrial fibrillation  Allergies  Allergen Reactions   Statins Other (See Comments)    "Muscles were messed up"     Patient Measurements: Height: 5\' 10"  (177.8 cm) Weight: 104.3 kg (230 lb) IBW/kg (Calculated) : 73  Vital Signs: Temp: 97.4 F (36.3 C) (08/23 0725) Temp Source: Oral (08/23 0725) BP: 133/80 (08/23 0725) Pulse Rate: 92 (08/23 0725)  Labs: Recent Labs    10/26/22 0537 10/27/22 0014 10/27/22 0909 10/28/22 0854  HGB 10.8* 10.6*  --  10.7*  HCT 33.3* 32.6*  --  33.3*  PLT 332 338  --  324  LABPROT 27.9*  --  25.4* 22.0*  INR 2.6*  --  2.3* 1.9*  CREATININE 0.73 0.85  --   --     Estimated Creatinine Clearance: 95 mL/min (by C-G formula based on SCr of 0.85 mg/dL).  Assessment: 72 y/o M presented to the ED on 10/17/22 with generalized weakness. On warfarin PTA for afib. INR supratherapeutic on admit at 3.8; trended down close to therapeutic at 3.3 on 8/13 and warfarin 5 mg x 1 was given (missed dose 8/12 PTA). INR trended back up to 4.4 on 8/14 and doses were held 8/14 and 8/15 when INR remained >3.    PTA warfarin regimen: 7.5 mg MWF and 5 mg TTSS - last outpatient INR 2.2 on 7/31   INR down to 1.9 today. He has been stable on the new daily regimen but we may need to alter the home regimen a little since he will likely need more than 5mg  daily.  Goal of Therapy:  INR 2-3 Monitor platelets by anticoagulation protocol: Yes   Plan:  Warfarin 7.5mg  PO x1 INR daily Monitor for signs/symptoms of bleeding and DDI.  Ulyses Southward, PharmD, BCIDP, AAHIVP, CPP Infectious Disease Pharmacist 10/28/2022 9:47 AM

## 2022-10-29 DIAGNOSIS — A401 Sepsis due to streptococcus, group B: Secondary | ICD-10-CM | POA: Diagnosis not present

## 2022-10-29 DIAGNOSIS — R7881 Bacteremia: Secondary | ICD-10-CM | POA: Diagnosis not present

## 2022-10-29 DIAGNOSIS — Z515 Encounter for palliative care: Secondary | ICD-10-CM | POA: Diagnosis not present

## 2022-10-29 DIAGNOSIS — Z7189 Other specified counseling: Secondary | ICD-10-CM | POA: Diagnosis not present

## 2022-10-29 DIAGNOSIS — I77819 Aortic ectasia, unspecified site: Secondary | ICD-10-CM | POA: Diagnosis not present

## 2022-10-29 DIAGNOSIS — J189 Pneumonia, unspecified organism: Secondary | ICD-10-CM | POA: Diagnosis not present

## 2022-10-29 LAB — PROTIME-INR
INR: 1.9 — ABNORMAL HIGH (ref 0.8–1.2)
Prothrombin Time: 21.9 s — ABNORMAL HIGH (ref 11.4–15.2)

## 2022-10-29 LAB — GLUCOSE, CAPILLARY: Glucose-Capillary: 127 mg/dL — ABNORMAL HIGH (ref 70–99)

## 2022-10-29 MED ORDER — WARFARIN SODIUM 7.5 MG PO TABS
7.5000 mg | ORAL_TABLET | Freq: Once | ORAL | Status: AC
  Administered 2022-10-29: 7.5 mg via ORAL
  Filled 2022-10-29: qty 1

## 2022-10-29 NOTE — Progress Notes (Addendum)
Physical Therapy Treatment Patient Details Name: [name] MRN: [id number] DOB: 1950-11-15 Today's Date: 10/29/2022   History of Present Illness Pt is a 72 y.o. male admitted 10/17/22 with a "viral type illness" resulting in weakness, fatigue and fall calling EMS. Workup for CAP. PMH includes HTN, moderate AS, afib, HLD, OSA, morbid obesity.    PT Comments  Pt greeted resting in bed and agreeable to session. Pt stating personal goal is to be able to get up and walk to bathroom without assist to facilitate increased feelings of independence, with pt verbalizing understanding of importance of PT and time up OOB to maintain/increase strength and activity tolerance to achieve this goal. Pt with good participation during session able to come to sitting EOB with mod A to elevate trunk and rise to stand with good power up to RW with CGA for safety. Pt requiring min A to take steps along EOB to [facility] to manage and advance RW laterally, with pt endorsing increased L knee pain with stepping. Pt with fair tolerance for seated LE therex with cues for technique.  Pt continues to be limited by decreased activity tolerance, poor balance/postural reactions, gross weakness and pain. Current plan, <3 hrs/day, remains appropriate to address deficits and maximize functional independence and decrease caregiver burden as pt from home alone. Pt continues to benefit from skilled PT services to progress toward functional mobility goals.      If plan is discharge home, recommend the following: A little help with walking and/or transfers;A little help with bathing/dressing/bathroom;Assistance with cooking/housework;Assist for transportation;Help with stairs or ramp for entrance   Can travel by private vehicle     No  Equipment Recommendations  None recommended by PT    Recommendations for Other Services       Precautions / Restrictions Precautions Precautions: Fall Restrictions Weight Bearing Restrictions: No      Mobility  Bed Mobility Overal bed mobility: Needs Assistance Bed Mobility: Supine to Sit, Sit to Supine     Supine to sit: Mod assist Sit to supine: Mod assist   General bed mobility comments: Mod A for truncal elevation and scooting hips out to EOB. Mod A to bring BLE into bed as well    Transfers Overall transfer level: Needs assistance Equipment used: Rolling walker (2 wheels) Transfers: Sit to/from Stand Sit to Stand: Contact guard assist           General transfer comment: pt able to rise without physical assist with good power up    Ambulation/Gait Ambulation/Gait assistance: Min assist Gait Distance (Feet): 2 Feet Assistive device: Rolling walker (2 wheels) Gait Pattern/deviations: Step-to pattern, Trunk flexed, Antalgic Gait velocity: decr     General Gait Details: pt able to side step up to [facility], min A to advance RW laterally   Stairs             Wheelchair Mobility     Tilt Bed    Modified Rankin (Stroke Patients Only)       Balance Overall balance assessment: Needs assistance Sitting-balance support: No upper extremity supported, Feet supported, Feet unsupported Sitting balance-Leahy Scale: Fair Sitting balance - Comments: assist to don/doff shoes sitting EOB. Nephew iondep assisting   Standing balance support: During functional activity, Single extremity supported, Bilateral upper extremity supported, Reliant on assistive device for balance Standing balance-Leahy Scale: Poor Standing balance comment: reliant on external supporrt  Cognition Arousal: Alert Behavior During Therapy: WFL for tasks assessed/performed Overall Cognitive Status: Within Functional Limits for tasks assessed                                 General Comments: pt with improved mood this session, has set personal goal for therapy/rehabilitation to be able ot get up and get to bathroom on own, discussed mportance  of continued mobility to maintain strength        Exercises General Exercises - Lower Extremity Long Arc Quad: AROM, Right, Left, 10 reps, Seated (with 2 second hold at end range, x2 sets) Hip ABduction/ADduction: AROM, Left, Right, 10 reps, Seated (x2 sets with manual resistance) Hip Flexion/Marching: AROM, Right, Left, 20 reps, Seated (x2 sets)    General Comments        Pertinent Vitals/Pain Pain Assessment Pain Assessment: Faces Faces Pain Scale: Hurts little more Pain Location: L knee with mobility Pain Descriptors / Indicators: Sore, Tightness, Grimacing Pain Intervention(s): Limited activity within patient's tolerance, Monitored during session    Home Living                          Prior Function            PT Goals (current goals can now be found in the care plan section) Acute Rehab PT Goals Patient Stated Goal: to be able to get up and get to bathrrom without asssit PT Goal Formulation: With patient Time For Goal Achievement: 11/01/22 Progress towards PT goals: Progressing toward goals (slowly)    Frequency    Min 1X/week      PT Plan      Co-evaluation              AM-PAC PT "6 Clicks" Mobility   Outcome Measure  Help needed turning from your back to your side while in a flat bed without using bedrails?: A Little Help needed moving from lying on your back to sitting on the side of a flat bed without using bedrails?: A Little Help needed moving to and from a bed to a chair (including a wheelchair)?: A Lot Help needed standing up from a chair using your arms (e.g., wheelchair or bedside chair)?: A Lot Help needed to walk in hospital room?: A Lot Help needed climbing 3-5 steps with a railing? : A Lot 6 Click Score: 14    End of Session   Activity Tolerance: Patient tolerated treatment well Patient left: with call bell/phone within reach;in bed;with bed alarm set Nurse Communication: Mobility status PT Visit Diagnosis: Other  abnormalities of gait and mobility (R26.89);Muscle weakness (generalized) (M62.81)     Time: 5409-8119 PT Time Calculation (min) (ACUTE ONLY): 31 min  Charges:    $Therapeutic Exercise: 8-22 mins $Therapeutic Activity: 8-22 mins PT General Charges $$ ACUTE PT VISIT: 1 Visit                     Yaiza Palazzola R. PTA Acute Rehabilitation Services Office: [phone]   Catalina Antigua 10/29/2022, 10:30 AM

## 2022-10-29 NOTE — Progress Notes (Signed)
Daily Progress Note   Patient Name: [name]       Date: 10/29/2022 DOB: Jul 30, 1950  Age: 72 y.o. MRN#: [id number] Attending Physician: Merlene Laughter, DO Primary Care Physician: [name], MD Admit Date: 10/17/2022  Reason for Consultation/Follow-up: Establishing goals of care  Length of Stay: 12  Current Medications: Scheduled Meds:   ammonium lactate   Topical BID   amoxicillin  1,000 mg Oral Q8H   [START ON 11/03/2022] amoxicillin  500 mg Oral Q12H   carvedilol  12.5 mg Oral BID WC   cloNIDine  0.2 mg Oral BID   vitamin B-12  1,000 mcg Oral Daily   doxazosin  4 mg Oral Daily   folic acid  1 mg Oral Daily   iron polysaccharides  150 mg Oral Daily   losartan  100 mg Oral Daily   lubiprostone  24 mcg Oral BID   magnesium oxide  400 mg Oral BID   omega-3 acid ethyl esters  1 g Oral Daily   potassium chloride  20 mEq Oral Daily   warfarin  7.5 mg Oral ONCE-1600   Warfarin - Pharmacist Dosing Inpatient   Does not apply q1600    Continuous Infusions:   PRN Meds: acetaminophen **OR** acetaminophen, albuterol, bisacodyl, diphenhydrAMINE, EPINEPHrine, senna-docusate  Physical Exam Vitals reviewed.  HENT:     Head: Normocephalic and atraumatic.     Mouth/Throat:     Mouth: Mucous membranes are moist.  Pulmonary:     Effort: Pulmonary effort is normal.  Skin:    General: Skin is warm and dry.  Neurological:     Mental Status: He is alert and oriented to person, place, and time.  Psychiatric:        Mood and Affect: Mood normal.        Behavior: Behavior normal.        Thought Content: Thought content normal.        Judgment: Judgment normal.            Vital Signs: BP 126/74 (BP Location: Right Arm)   Pulse 85   Temp (!) 97.4 F (36.3 C) (Oral)   Resp  19   Ht 5\' 10"  (1.778 m)   Wt 106.6 kg   SpO2 96%   BMI 33.72 kg/m  SpO2: SpO2: 96 %  O2 Device: O2 Device: Room Air O2 Flow Rate: O2 Flow Rate (L/min): 2 L/min  Intake/output summary:  Intake/Output Summary (Last 24 hours) at 10/29/2022 1050 Last data filed at 10/29/2022 3244 Gross per 24 hour  Intake 240 ml  Output 2200 ml  Net -1960 ml   LBM: Last BM Date : 10/28/22 Baseline Weight: Weight: 109.9 kg Most recent weight: Weight: 106.6 kg       Palliative Assessment/Data: 40%      Palliative Care Assessment & Plan   Patient Profile: 72 y.o. male  with past medical history of HTN, moderate aortic stenosis, mitral valve prolapse, atrial fibrillation, hyperlipidemia, OSA not on CPAP, and morbid obesity admitted on 10/17/2022 by EMS for weakness and a fall after having what he calls a viral type illness w/ shakes and chills earlier that week.   Patient has since been treated for sepsis secondary to streptococcal bacteremia and community-acquired pneumonia.  Assessment: Extensive chart review has been completed prior to meeting with patient including labs, vital signs, imaging, progress/consult notes, orders, medications and available advance directive documents.  Patient remains on oral antibiotics. He reports having a decent bowel movement yesterday and feels better today. His mood seems improved from yesterday.  He is glad we had a meeting yesterday. When talking about goals for the SNF he tells me that he has a goal of being able to walk to the bathroom without assistance. We discussed his general goal for independence.   We discussed his end of life goals which are to be comfortable, without pain, and to not feel like a burden. He shares that he has already planned for his cremation services so his nephew will not have to worry about this.  12:05 Spoke to patient's nephew Theodoro Grist. Answered any questions about plan moving forward with outpatient palliative care at the SNF.  Encouraged him to call with any questions before discharge.  Recommendations/Plan: DNR/DNI Manage current infection Patient leaning towards not having dental extractions Discharge to SNF for rehab Outpatient palliative at SNF Patient's long-term goals are to be comfortable and not escalate care PMT will continue to support   Code Status:    Code Status Orders  (From admission, onward)           Start     Ordered   10/18/22 0310  Do not attempt resuscitation (DNR)  Continuous       Question Answer Comment  If patient has no pulse and is not breathing Do Not Attempt Resuscitation   If patient has a pulse and/or is breathing: Medical Treatment Goals COMFORT MEASURES: Keep clean/warm/dry, use medication by any route; positioning, wound care and other measures to relieve pain/suffering; use oxygen, suction/manual treatment of airway obstruction for comfort; do not transfer unless for comfort needs.   Consent: Discussion documented in EHR or advanced directives reviewed      10/18/22 0310           Time spent: 35 minutes  Thank you for allowing the Palliative Medicine Team to assist in the care of this patient.  Sherryll Burger, NP  Please contact Palliative Medicine Team phone at [phone] for questions and concerns.

## 2022-10-29 NOTE — Progress Notes (Signed)
ANTICOAGULATION CONSULT NOTE - Follow Up Consult  Pharmacy Consult for Warfarin Indication: atrial fibrillation  Allergies  Allergen Reactions   Statins Other (See Comments)    "Muscles were messed up"     Patient Measurements: Height: 5\' 10"  (177.8 cm) Weight: 106.6 kg (235 lb) IBW/kg (Calculated) : 73  Vital Signs: Temp: 97.4 F (36.3 C) (08/24 0734) Temp Source: Oral (08/24 0734) BP: 126/74 (08/24 0734) Pulse Rate: 85 (08/24 0734)  Labs: Recent Labs    10/27/22 0014 10/27/22 0909 10/28/22 0854 10/29/22 0359  HGB 10.6*  --  10.7*  --   HCT 32.6*  --  33.3*  --   PLT 338  --  324  --   LABPROT  --  25.4* 22.0* 21.9*  INR  --  2.3* 1.9* 1.9*  CREATININE 0.85  --  0.69  --     Estimated Creatinine Clearance: 102 mL/min (by C-G formula based on SCr of 0.69 mg/dL).  Assessment: 72 y/o M presented to the ED on 10/17/22 with generalized weakness. On warfarin PTA for afib. INR supratherapeutic on admit at 3.8; trended down close to therapeutic at 3.3 on 8/13 and warfarin 5 mg x 1 was given (missed dose 8/12 PTA). INR trended back up to 4.4 on 8/14 and doses were held 8/14 and 8/15 when INR remained >3.    PTA warfarin regimen: 7.5 mg MWF and 5 mg TTSS - last outpatient INR 2.2 on 7/31   INR still subtherapeutic at 1.9 today. He has been stable on the new daily regimen but we may need to alter the home regimen a little since he will likely need more than 5mg  daily.  Goal of Therapy:  INR 2-3 Monitor platelets by anticoagulation protocol: Yes   Plan:  Warfarin 7.5mg  PO x1 INR daily Monitor for signs/symptoms of bleeding and DDI.  Thank you for involving pharmacy in the patient's care.   Theotis Burrow, PharmD PGY1 Acute Care Pharmacy Resident  10/29/2022 8:41 AM

## 2022-10-29 NOTE — Progress Notes (Signed)
PROGRESS NOTE    [name]  ZOX:[id number] DOB: [birth year] DOA: 10/17/2022 PCP: [name], MD   Brief Narrative:  The patient is a 72 year old Caucasian male with a past medical history significant for but not to HTN, moderate aortic stenosis, mitral valve prolapse, atrial fibrillation, hyperlipidemia, OSA not on CPAP, Obesity as well as other comorbidities who earlier in the week developed what he does describes as a viral illness with shakes and chills but then subsequently resolved.  He has not become weaker and weaker since and eventually fell but did not hit his head and there is no loss of consciousness.  Given his fall he is brought to the ED after he called 911 and in the ED he was found to be febrile.  Chest x-ray was done and showed cardiomegaly with pulmonary vascular congestion and possible edema versus infiltrate.  Lactic acid level was elevated at 5 and he was given IV Rocephin and azithromycin.  He is admitted for further workup and lactic acid started downtrending and WBC is now resolved but blood cultures came back positive for Streptococcus.  He is now being treated for streptococcal bacteremia and he was placed on IV Cefazolin and further workup reveals likely the source of the infection is suspected to be dental with peridental disease with dental caries and multiple erosions.  His IV cefazolin has now been changed to oral amoxicillin to bridge maxillofacial evaluation and possible dental extraction.  Given his deconditioning PT OT have evaluated and recommending SNF and he is now agreeable but was at first hesitant to go to rehab. He is medically stable to D/C and will need follow up with PCP, ID, Palliative Care Medicine and Cardiology in the outpatient setting.   Assessment and Plan:  Sepsis Secondary to Streptococcal bacteremia and community-acquired pneumonia:  -Vitals are stable this a.m. -Leukocytosis resolved.  TTE and TEE negative for endocarditis -ID  recommendations: Continue IV cefazolin 2 g every 8 hours but now changing to oral Amoxicillin 1000 mg po q8h -Continue Monitor Vitals -ID ordered imaging of teeth and it showed "Periodontal disease and alveolar resorption with exposure of the roots of the maxillary and mandibular dentition. Superimposed dental caries with multiple erosions within the residual maxillary molars. No periapical lucency identified." -ID also pending referral to [facility] for evaluation of his dental caries and anticipating extractions and likely will need a maxillofacial surgeon -PT OT recommending SNF and Anticipating D/C when Bed is avavilable and Insurance Auth obtained  -Wants to speak with Palliative Care so Consult has been placed and goals of care being elucidated and patient will continue to manage current infection with current antibiotics and is leaning towards not having a dental extractions.  Palliative follow-up at SNF and patient's long-term goals are to be comfortable and not escalate care eventually but would like to pursue SNF for rehab with outpatient Palliative to follow at SNF   Dyspnea and Hypoxia in the setting of Streptococcal PNA -Improved, now on room air SpO2: 98 % O2 Flow Rate (L/min): 2 L/min -Give incentive spirometry -Continuous Pulse Oximetry and Maintain O2 Sat's greater than 90% -Continue Supplemental O2 via [place] and [place] O2 as Tolerated -Will need an Ambulatory Home O2 Screen prior to D/C -Repeat CXR the day before yesterday AM done and showed "No evidence of acute cardiopulmonary disease. Improved/resolved probable interstitial edema."   Normocytic Anemia -Chronic. Hgb/Hct Trend: Recent Labs  Lab 10/21/22 0911 10/22/22 8119 10/23/22 1478 10/25/22 0431 10/26/22 2956 10/27/22 0014 10/28/22 2130  HGB 10.4* 10.5* 10.1* 10.4* 10.8* 10.6* 10.7*  HCT 31.1* 31.7* 30.9* 32.2* 33.3* 32.6* 33.3*  MCV 88.9 89.3 91.4 91.5 92.0 93.1 90.5  -Checked Anemia Panel and showed an iron level of 19,  UIBC of 282, TIBC of 301, saturation ratios of 6%, ferritin level of 141, folate level 5.4, vitamin B12 level of 149 -Will start p.o. Niferex, p.o. folic acid supplementation and B12 supplementation -Continue to Monitor for S/Sx of Bleeding; No overt Bleeding noted -Repeat CBC intermittently   Hypokalemia, improved -Patient's K+ Level Trend: Recent Labs  Lab 10/21/22 0911 10/22/22 0651 10/23/22 0555 10/25/22 0431 10/26/22 0537 10/27/22 0014 10/28/22 0854  K 3.0* 4.4 4.9 4.0 4.3 4.5 3.7  -Continue to Monitor and Replete as Necessary -Repeat CMP in the AM    Obstructive Sleep Apnea -Give incentive spirometry -Does not use CPAP at home. -Would benefit from CPAP at bedtime.   Chronic Atrial Fibrillation -Rate controlled -CHA?DS?-VASc of at least 3. -INR is slightly subtherapeutic at 1.9 this a.m.again  -Continue Warfarin per Pharmacy and he will receive a 7.5 mg dose today -Follow-up PT/INR -On Carvedilol 12.5 mg po BID -Continue to Monitor on Telemetry  Hyponatremia -In the setting of Hydrochlorothiazide use -Will Hold and Discontinue Hydrochlorothiazide -Na+ Trend:  Recent Labs  Lab 10/21/22 0911 10/22/22 0651 10/23/22 0555 10/25/22 0431 10/26/22 0537 10/27/22 0014 10/28/22 0854  NA 135 137 134* 134* 133* 132* 134*  -Replete with IV Sodium Phos 15 mmol  -Continue to Monitor and Trend and repeat CMP intermittently   Hypophosphatemia -Phos Level Trend: Recent Labs  Lab 10/21/22 0907 10/26/22 0537 10/27/22 0014 10/28/22 0854  PHOS 1.5* 2.9 2.3* 2.3*  -Replete with IV Sodium Phos 15 mmol again  -Continue to Monitor and Replete as Necessary -Repeat Phos Level intermittently    Nonrheumatic aortic valve stenosis -Continue Coumadin   CAD (coronary artery disease) -No acute chest pain symptoms -On warfarin per pharmacy. -Continue Losartan 100 mg p.o. daily and Carvedilol 12.5 mg po BID -Continue to Monitor and Trend   HTN (Hypertension) -Mildly elevated  blood pressure. -Continue ARB with Losartan 100 mg po Daily, Clonidine 0.2 mg po BID, Carvedilol 12.5 mg po BID, and Doxazosin 4 mg po Daily  -Will Discontinue Hydrochlorothiazide 12.5 mg p.o. daily due to Hyponatremia -Continue to Monitor BP per Protocol and Last BP reading was on the softer side 100/72   Aortic Dilatation Moderate aortic stenosis Mitral regurgitation -EF 60-65% no RWMA, small pericardial effusion,  -Mitral valve is normal and Aortic valve is normal  -Mild dilatation of aortic root and ascending aorta no interatrial shunt.  -Needs Cardiology Follow up at DC    Dyslipidemia -Currently not on medical therapy except Omega-3 Acid Ethyl Esters 1 gram po Daily -Follow-up with PCP.  Constipation -C/w Senna-docusate 1 tab po at bedtime prn and will add Bisacodyl 10 mg RC Dailyprn Moderate Constipation; Will try another Suppository  -C/w Lubiprostone 24 mcg po BID -Has an Adverse Effect to Miralax so will avoid    Physical Debility/Generalized Deconditioning:  -PT recommended home health as patient was declining rehab.   -Unable to take care of himself and Likely will need rehab and now PT/OT recommending SNF and Nephew and Patient in agreement  Hypoalbuminemia -Patient's Albumin Trend: Recent Labs  Lab 10/17/22 1929 10/26/22 0537 10/27/22 0014 10/28/22 0854  ALBUMIN 2.9* 2.4* 2.4* 2.5*  -Continue to Monitor and Trend and repeat CMP intermittently   Obesity -Complicates overall prognosis and care -Estimated body mass index is  33.72 kg/m as calculated from the following:   Height as of this encounter: 5\' 10"  (1.778 m).   Weight as of this encounter: 106.6 kg.  -Weight Loss and Dietary Counseling given   DVT prophylaxis:  warfarin (COUMADIN) tablet 7.5 mg    Code Status: DNR Family Communication: Discussed with Nephew at bedside  Disposition Plan:  Level of care: Telemetry Medical Status is: Inpatient Remains inpatient appropriate because: Medically stable  to D/C to SNF given bed availability but awaiting Insurance Auth    Consultants:  Palliative Care Medicine Infectious Diseases  Cardiology for TEE   Procedures:  TRANSTHORACIC ECHOCARDIOGRAM IMPRESSIONS     1. Left ventricular ejection fraction, by estimation, is 60 to 65%. The  left ventricle has normal function. The left ventricle has no regional  wall motion abnormalities. There is mild concentric left ventricular  hypertrophy. Left ventricular diastolic  function could not be evaluated. Elevated left atrial pressure.   2. Right ventricular systolic function is normal. The right ventricular  size is normal.   3. A small pericardial effusion is present.   4. The mitral valve is normal in structure. Mild mitral valve  regurgitation. No evidence of mitral stenosis.   5. The aortic valve is normal in structure. Aortic valve regurgitation is  trivial. Moderate aortic valve stenosis.   6. Aortic dilatation noted. There is mild dilatation of the aortic root,  measuring 39 mm. There is mild dilatation of the ascending aorta,  measuring 41 mm.   7. The inferior vena cava is dilated in size with >50% respiratory  variability, suggesting right atrial pressure of 8 mmHg.   FINDINGS   Left Ventricle: Left ventricular ejection fraction, by estimation, is 60  to 65%. The left ventricle has normal function. The left ventricle has no  regional wall motion abnormalities. The left ventricular internal cavity  size was normal in size. There is   mild concentric left ventricular hypertrophy. Left ventricular diastolic  function could not be evaluated due to atrial fibrillation. Left  ventricular diastolic function could not be evaluated. Elevated left  atrial pressure.   Right Ventricle: The right ventricular size is normal. Right ventricular  systolic function is normal.   Left Atrium: Left atrial size was normal in size.   Right Atrium: Right atrial size was normal in size.    Pericardium: A small pericardial effusion is present.   Mitral Valve: The mitral valve is normal in structure. Mild mitral valve  regurgitation. No evidence of mitral valve stenosis. MV peak gradient, 7.1  mmHg. The mean mitral valve gradient is 4.0 mmHg.   Tricuspid Valve: The tricuspid valve is normal in structure. Tricuspid  valve regurgitation is trivial. No evidence of tricuspid stenosis.   Aortic Valve: The aortic valve is normal in structure. Aortic valve  regurgitation is trivial. Aortic regurgitation PHT measures 684 msec.  Moderate aortic stenosis is present. Aortic valve mean gradient measures  24.0 mmHg. Aortic valve peak gradient  measures 44.7 mmHg. Aortic valve area, by VTI measures 1.49 cm.   Pulmonic Valve: The pulmonic valve was normal in structure. Pulmonic valve  regurgitation is trivial. No evidence of pulmonic stenosis.   Aorta: Aortic dilatation noted. There is mild dilatation of the aortic  root, measuring 39 mm. There is mild dilatation of the ascending aorta,  measuring 41 mm.   Venous: The inferior vena cava is dilated in size with greater than 50%  respiratory variability, suggesting right atrial pressure of 8 mmHg.   IAS/Shunts:  No atrial level shunt detected by color flow Doppler.     LEFT VENTRICLE  PLAX 2D  LVIDd:         5.10 cm     Diastology  LVIDs:         3.60 cm     LV e' medial:    7.84 cm/s  LV PW:         1.00 cm     LV E/e' medial:  17.0  LV IVS:        1.10 cm     LV e' lateral:   8.33 cm/s  LVOT diam:     1.90 cm     LV E/e' lateral: 16.0  LV SV:         93  LV SV Index:   41  LVOT Area:     2.84 cm    LV Volumes (MOD)  LV vol d, MOD A2C: 94.2 ml  LV vol d, MOD A4C: 95.3 ml  LV vol s, MOD A2C: 43.1 ml  LV vol s, MOD A4C: 42.5 ml  LV SV MOD A2C:     51.1 ml  LV SV MOD A4C:     95.3 ml  LV SV MOD BP:      54.4 ml   RIGHT VENTRICLE             IVC  RV Basal diam:  4.00 cm     IVC diam: 2.80 cm  RV S prime:     12.47 cm/s   TAPSE (M-mode): 2.2 cm   LEFT ATRIUM             Index        RIGHT ATRIUM           Index  LA diam:        4.30 cm 1.90 cm/m   RA Area:     20.90 cm  LA Vol (A2C):   77.1 ml 34.05 ml/m  RA Volume:   56.40 ml  24.91 ml/m  LA Vol (A4C):   69.0 ml 30.47 ml/m  LA Biplane Vol: 73.9 ml 32.63 ml/m   AORTIC VALVE  AV Area (Vmax):    1.40 cm  AV Area (Vmean):   1.52 cm  AV Area (VTI):     1.49 cm  AV Vmax:           334.25 cm/s  AV Vmean:          230.500 cm/s  AV VTI:            0.625 m  AV Peak Grad:      44.7 mmHg  AV Mean Grad:      24.0 mmHg  LVOT Vmax:         165.00 cm/s  LVOT Vmean:        123.667 cm/s  LVOT VTI:          0.328 m  LVOT/AV VTI ratio: 0.52  AI PHT:            684 msec    AORTA  Ao Root diam: 3.90 cm  Ao Asc diam:  4.10 cm   MITRAL VALVE                TRICUSPID VALVE  MV Area (PHT): 4.22 cm     TR Peak grad:   29.8 mmHg  MV Area VTI:   4.02 cm     TR Vmax:  273.00 cm/s  MV Peak grad:  7.1 mmHg  MV Mean grad:  4.0 mmHg     SHUNTS  MV Vmax:       1.33 m/s     Systemic VTI:  0.33 m  MV Vmean:      93.0 cm/s    Systemic Diam: 1.90 cm  MV Decel Time: 180 msec  MV E velocity: 133.67 cm/s   TRANSESOPHAGEAL ECHOCARDIOGRAM     Brief TEE Note   LVEF 60-65% 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 No LA/LAA thrombus or masses No evidence of endocarditis Trivial pericardial effusion Small pleural effusion     Antimicrobials:  Anti-infectives (From admission, onward)    Start     Dose/Rate Route Frequency Ordered Stop   11/03/22 1000  amoxicillin (AMOXIL) capsule 500 mg        500 mg Oral Every 12 hours 10/27/22 0910 11/10/22 0959   10/26/22 1400  amoxicillin (AMOXIL) capsule 1,000 mg        1,000 mg Oral Every 8 hours 10/26/22 1136 11/02/22 2359   10/19/22 1400  ceFAZolin (ANCEF) IVPB 2g/100 mL premix  Status:  Discontinued        2 g 200 mL/hr over 30  Minutes Intravenous Every 8 hours 10/18/22 1441 10/26/22 1136   10/19/22 1100  amoxicillin (AMOXIL) capsule 500 mg        500 mg Oral  Once 10/19/22 1014 10/19/22 1225   10/18/22 1400  cefTRIAXone (ROCEPHIN) 2 g in sodium chloride 0.9 % 100 mL IVPB  Status:  Discontinued        2 g 200 mL/hr over 30 Minutes Intravenous Every 24 hours 10/18/22 1054 10/18/22 1441   10/17/22 2045  azithromycin (ZITHROMAX) 500 mg in sodium chloride 0.9 % 250 mL IVPB  Status:  Discontinued        500 mg 250 mL/hr over 60 Minutes Intravenous Every 24 hours 10/17/22 2039 10/18/22 1441   10/17/22 2045  cefTRIAXone (ROCEPHIN) 1 g in sodium chloride 0.9 % 100 mL IVPB  Status:  Discontinued        1 g 200 mL/hr over 30 Minutes Intravenous Every 24 hours 10/17/22 2039 10/18/22 1054       Subjective: Seen and examined at bedside and he is doing fairly well today.  States he had a bowel movement.  Attempting to eat little bit more.  No nausea or vomiting.  Denies lightheadedness or dizziness.  No other concerns or complaints at this time.  Objective: Vitals:   10/29/22 0108 10/29/22 0540 10/29/22 0734 10/29/22 1219  BP: (!) 143/75 135/87 126/74 100/72  Pulse: 63 81 85 70  Resp: 18 18 19 19   Temp: 98.6 F (37 C) 98.4 F (36.9 C) (!) 97.4 F (36.3 C) 97.9 F (36.6 C)  TempSrc: Oral Oral Oral   SpO2: 99% 98% 96% 98%  Weight:  106.6 kg    Height:        Intake/Output Summary (Last 24 hours) at 10/29/2022 1447 Last data filed at 10/29/2022 3086 Gross per 24 hour  Intake 240 ml  Output 1700 ml  Net -1460 ml   Filed Weights   10/26/22 0600 10/28/22 0519 10/29/22 0540  Weight: 109 kg 104.3 kg 106.6 kg   Examination: Physical Exam:  Constitutional: WN/WD obese Caucasian male in no acute distress appears calm Respiratory: Diminished to auscultation bilaterally, no wheezing, rales, rhonchi or crackles. Normal respiratory effort  and patient is not tachypenic. No accessory muscle use.  Unlabored  breathing Cardiovascular: RRR, no murmurs / rubs / gallops. S1 and S2 auscultated.  Trace extremity edema Abdomen: Soft, non-tender, distended secondary body habitus. Bowel sounds positive.  GU: Deferred. Musculoskeletal: No clubbing / cyanosis of digits/nails. No joint deformity upper and lower extremities.  Neurologic: CN 2-12 grossly intact with no focal deficits. Romberg sign and cerebellar reflexes not assessed.  Psychiatric: Normal judgment and insight. Alert and oriented x 3. Normal mood and appropriate affect.   Data Reviewed: I have personally reviewed following labs and imaging studies  CBC: Recent Labs  Lab 10/23/22 0555 10/25/22 0431 10/26/22 0537 10/27/22 0014 10/28/22 0854  WBC 9.5 7.4 8.3 9.3 7.7  NEUTROABS  --   --  5.7 6.8 5.7  HGB 10.1* 10.4* 10.8* 10.6* 10.7*  HCT 30.9* 32.2* 33.3* 32.6* 33.3*  MCV 91.4 91.5 92.0 93.1 90.5  PLT 291 330 332 338 324   Basic Metabolic Panel: Recent Labs  Lab 10/23/22 0555 10/25/22 0431 10/26/22 0537 10/27/22 0014 10/28/22 0854  NA 134* 134* 133* 132* 134*  K 4.9 4.0 4.3 4.5 3.7  CL 101 100 100 101 100  CO2 28 26 25 24 25   GLUCOSE 123* 105* 104* 119* 131*  BUN 16 22 19 22 20   CREATININE 1.10 0.88 0.73 0.85 0.69  CALCIUM 10.2 10.3 10.2 10.3 10.0  MG  --  2.3 2.2 2.1 2.0  PHOS  --   --  2.9 2.3* 2.3*   GFR: Estimated Creatinine Clearance: 102 mL/min (by C-G formula based on SCr of 0.69 mg/dL). Liver Function Tests: Recent Labs  Lab 10/26/22 0537 10/27/22 0014 10/28/22 0854  AST 17 18 13*  ALT 7 9 7   ALKPHOS 81 83 92  BILITOT 0.4 0.5 0.6  PROT 5.9* 6.0* 6.3*  ALBUMIN 2.4* 2.4* 2.5*   No results for input(s): "LIPASE", "AMYLASE" in the last 168 hours. No results for input(s): "AMMONIA" in the last 168 hours. Coagulation Profile: Recent Labs  Lab 10/24/22 0434 10/26/22 0537 10/27/22 0909 10/28/22 0854 10/29/22 0359  INR 2.4* 2.6* 2.3* 1.9* 1.9*   Cardiac Enzymes: No results for input(s): "CKTOTAL",  "CKMB", "CKMBINDEX", "TROPONINI" in the last 168 hours. BNP (last 3 results) No results for input(s): "PROBNP" in the last 8760 hours. HbA1C: No results for input(s): "HGBA1C" in the last 72 hours. CBG: Recent Labs  Lab 10/23/22 0811  GLUCAP 164*   Lipid Profile: No results for input(s): "CHOL", "HDL", "LDLCALC", "TRIG", "CHOLHDL", "LDLDIRECT" in the last 72 hours. Thyroid Function Tests: No results for input(s): "TSH", "T4TOTAL", "FREET4", "T3FREE", "THYROIDAB" in the last 72 hours. Anemia Panel: Recent Labs    10/28/22 0854  VITAMINB12 149*  FOLATE 5.4*  FERRITIN 141  TIBC 301  IRON 19*  RETICCTPCT 3.3*   Sepsis Labs: No results for input(s): "PROCALCITON", "LATICACIDVEN" in the last 168 hours.  Recent Results (from the past 240 hour(s))  Culture, blood (Routine X 2) w Reflex to ID Panel     Status: None   Collection Time: 10/20/22 11:52 AM   Specimen: BLOOD LEFT ARM  Result Value Ref Range Status   Specimen Description BLOOD LEFT ARM  Final   Special Requests   Final    BOTTLES DRAWN AEROBIC AND ANAEROBIC Blood Culture adequate volume   Culture   Final    NO GROWTH 5 DAYS Performed at [facility], [address], [place], [place] [address]    Report Status 10/25/2022  FINAL  Final  Culture, blood (Routine X 2) w Reflex to ID Panel     Status: None   Collection Time: 10/20/22 11:52 AM   Specimen: BLOOD LEFT HAND  Result Value Ref Range Status   Specimen Description BLOOD LEFT HAND  Final   Special Requests   Final    BOTTLES DRAWN AEROBIC AND ANAEROBIC Blood Culture adequate volume   Culture   Final    NO GROWTH 5 DAYS Performed at [facility], [address], [place], [place] [address]    Report Status 10/25/2022 FINAL  Final    Radiology Studies: No results found.  Scheduled Meds:  ammonium lactate   Topical BID   amoxicillin  1,000 mg Oral Q8H   [START ON 11/03/2022] amoxicillin  500 mg Oral Q12H   carvedilol  12.5 mg Oral BID WC    cloNIDine  0.2 mg Oral BID   vitamin B-12  1,000 mcg Oral Daily   doxazosin  4 mg Oral Daily   folic acid  1 mg Oral Daily   iron polysaccharides  150 mg Oral Daily   losartan  100 mg Oral Daily   lubiprostone  24 mcg Oral BID   magnesium oxide  400 mg Oral BID   omega-3 acid ethyl esters  1 g Oral Daily   potassium chloride  20 mEq Oral Daily   warfarin  7.5 mg Oral ONCE-1600   Warfarin - Pharmacist Dosing Inpatient   Does not apply q1600   Continuous Infusions:   LOS: 12 days   Marguerita Merles, DO [facility] Available via Epic secure chat 7am-7pm After these hours, please refer to coverage provider listed on [email or web address] 10/29/2022, 2:47 PM

## 2022-10-29 NOTE — Plan of Care (Signed)

## 2022-10-29 NOTE — TOC Progression Note (Signed)
Transition of Care [facility]) - Progression Note    Patient Details  Name: [name] MRN: [id number] Date of Birth: 10/18/1950  [facility] CM/SW Contact  Dellie Burns [place], [place] Phone Number: 10/29/2022, 2:50 PM  Clinical Narrative:   [employer] auth received for [name] [phone]) and ambulance transport/PTAR [id number]). Spoke to [facility] at [facility] who reports they are prepared to admit pt tomorrow. MD updated.   Dellie Burns, MSW, LCSW [phone] (coverage)           Expected Discharge Plan and Services                                               Social Determinants of Health (SDOH) Interventions SDOH Screenings   Food Insecurity: No Food Insecurity (10/18/2022)  Housing: Low Risk  (10/18/2022)  Transportation Needs: No Transportation Needs (10/18/2022)  Utilities: Not At Risk (10/18/2022)  Alcohol Screen: Low Risk  (03/21/2022)  Depression (PHQ2-9): Low Risk  (09/09/2022)  Financial Resource Strain: Low Risk  (03/21/2022)  Physical Activity: Insufficiently Active (03/21/2022)  Social Connections: Socially Isolated (03/21/2022)  Stress: No Stress Concern Present (03/21/2022)  Tobacco Use: Low Risk  (10/25/2022)    Readmission Risk Interventions    10/18/2022    4:24 PM  Readmission Risk Prevention Plan  Post Dischage Appt Complete  Medication Screening Complete  Transportation Screening Complete

## 2022-10-30 DIAGNOSIS — I4891 Unspecified atrial fibrillation: Secondary | ICD-10-CM | POA: Diagnosis not present

## 2022-10-30 DIAGNOSIS — R6520 Severe sepsis without septic shock: Secondary | ICD-10-CM | POA: Diagnosis not present

## 2022-10-30 DIAGNOSIS — N179 Acute kidney failure, unspecified: Secondary | ICD-10-CM | POA: Diagnosis not present

## 2022-10-30 DIAGNOSIS — B955 Unspecified streptococcus as the cause of diseases classified elsewhere: Secondary | ICD-10-CM | POA: Diagnosis not present

## 2022-10-30 DIAGNOSIS — R5381 Other malaise: Secondary | ICD-10-CM | POA: Diagnosis not present

## 2022-10-30 DIAGNOSIS — I4821 Permanent atrial fibrillation: Secondary | ICD-10-CM | POA: Diagnosis not present

## 2022-10-30 DIAGNOSIS — F4320 Adjustment disorder, unspecified: Secondary | ICD-10-CM | POA: Diagnosis not present

## 2022-10-30 DIAGNOSIS — A401 Sepsis due to streptococcus, group B: Secondary | ICD-10-CM | POA: Diagnosis not present

## 2022-10-30 DIAGNOSIS — Z7401 Bed confinement status: Secondary | ICD-10-CM | POA: Diagnosis not present

## 2022-10-30 DIAGNOSIS — R627 Adult failure to thrive: Secondary | ICD-10-CM | POA: Diagnosis not present

## 2022-10-30 DIAGNOSIS — I10 Essential (primary) hypertension: Secondary | ICD-10-CM | POA: Diagnosis not present

## 2022-10-30 DIAGNOSIS — I2510 Atherosclerotic heart disease of native coronary artery without angina pectoris: Secondary | ICD-10-CM | POA: Diagnosis not present

## 2022-10-30 DIAGNOSIS — E785 Hyperlipidemia, unspecified: Secondary | ICD-10-CM | POA: Diagnosis not present

## 2022-10-30 DIAGNOSIS — I350 Nonrheumatic aortic (valve) stenosis: Secondary | ICD-10-CM | POA: Diagnosis not present

## 2022-10-30 DIAGNOSIS — R531 Weakness: Secondary | ICD-10-CM | POA: Diagnosis not present

## 2022-10-30 DIAGNOSIS — J189 Pneumonia, unspecified organism: Secondary | ICD-10-CM | POA: Diagnosis not present

## 2022-10-30 DIAGNOSIS — R7881 Bacteremia: Secondary | ICD-10-CM | POA: Diagnosis not present

## 2022-10-30 DIAGNOSIS — I2583 Coronary atherosclerosis due to lipid rich plaque: Secondary | ICD-10-CM | POA: Diagnosis not present

## 2022-10-30 DIAGNOSIS — I77819 Aortic ectasia, unspecified site: Secondary | ICD-10-CM | POA: Diagnosis not present

## 2022-10-30 DIAGNOSIS — D649 Anemia, unspecified: Secondary | ICD-10-CM | POA: Diagnosis not present

## 2022-10-30 DIAGNOSIS — E871 Hypo-osmolality and hyponatremia: Secondary | ICD-10-CM | POA: Diagnosis not present

## 2022-10-30 DIAGNOSIS — K5900 Constipation, unspecified: Secondary | ICD-10-CM | POA: Diagnosis not present

## 2022-10-30 DIAGNOSIS — G4733 Obstructive sleep apnea (adult) (pediatric): Secondary | ICD-10-CM | POA: Diagnosis not present

## 2022-10-30 LAB — PROTIME-INR
INR: 2 — ABNORMAL HIGH (ref 0.8–1.2)
Prothrombin Time: 23.1 s — ABNORMAL HIGH (ref 11.4–15.2)

## 2022-10-30 MED ORDER — BISACODYL 10 MG RE SUPP: 10.0000 mg | Freq: Every day | RECTAL | 0 refills | Status: DC | PRN

## 2022-10-30 MED ORDER — POTASSIUM CHLORIDE CRYS ER 20 MEQ PO TBCR: 20.0000 meq | EXTENDED_RELEASE_TABLET | Freq: Every day | ORAL | Status: DC

## 2022-10-30 MED ORDER — CYANOCOBALAMIN 1000 MCG PO TABS: 1000.0000 ug | ORAL_TABLET | Freq: Every day | ORAL | 0 refills | Status: DC

## 2022-10-30 MED ORDER — AMMONIUM LACTATE 12 % EX LOTN: TOPICAL_LOTION | Freq: Two times a day (BID) | CUTANEOUS | 0 refills | Status: DC

## 2022-10-30 MED ORDER — WARFARIN SODIUM 7.5 MG PO TABS
7.5000 mg | ORAL_TABLET | Freq: Once | ORAL | Status: DC
  Filled 2022-10-30: qty 1

## 2022-10-30 MED ORDER — ACETAMINOPHEN 325 MG PO TABS: 650.0000 mg | ORAL_TABLET | Freq: Four times a day (QID) | ORAL | Status: DC | PRN

## 2022-10-30 MED ORDER — MAGNESIUM OXIDE -MG SUPPLEMENT 400 (240 MG) MG PO TABS: 400.0000 mg | ORAL_TABLET | Freq: Two times a day (BID) | ORAL | Status: DC

## 2022-10-30 MED ORDER — AMOXICILLIN 500 MG PO CAPS: 500.0000 mg | ORAL_CAPSULE | Freq: Two times a day (BID) | ORAL | 0 refills | Status: AC

## 2022-10-30 MED ORDER — EPINEPHRINE 0.3 MG/0.3ML IJ SOAJ: 0.3000 mg | Freq: Once | INTRAMUSCULAR | Status: DC | PRN

## 2022-10-30 MED ORDER — AMOXICILLIN 500 MG PO CAPS: 1000.0000 mg | ORAL_CAPSULE | Freq: Three times a day (TID) | ORAL | 0 refills | Status: AC

## 2022-10-30 MED ORDER — FOLIC ACID 1 MG PO TABS: 1.0000 mg | ORAL_TABLET | Freq: Every day | ORAL | 0 refills | Status: DC

## 2022-10-30 MED ORDER — POLYSACCHARIDE IRON COMPLEX 150 MG PO CAPS: 150.0000 mg | ORAL_CAPSULE | Freq: Every day | ORAL | Status: DC

## 2022-10-30 MED ORDER — ALBUTEROL SULFATE (2.5 MG/3ML) 0.083% IN NEBU: 2.5000 mg | INHALATION_SOLUTION | RESPIRATORY_TRACT | Status: DC | PRN

## 2022-10-30 MED ORDER — DIPHENHYDRAMINE HCL 50 MG/ML IJ SOLN: 25.0000 mg | Freq: Once | INTRAMUSCULAR | Status: DC | PRN

## 2022-10-30 MED ORDER — SENNOSIDES-DOCUSATE SODIUM 8.6-50 MG PO TABS: 1.0000 | ORAL_TABLET | Freq: Every evening | ORAL | Status: DC | PRN

## 2022-10-30 NOTE — Discharge Summary (Signed)
Physician Discharge Summary   Patient: [name] MRN: [id number] DOB: 1950/05/26  Admit date:     10/17/2022  Discharge date: 10/30/22  Discharge Physician: Marguerita Merles, DO   PCP: [name], MD   Recommendations at discharge:   Follow-up with PCP within 1 to 2 weeks and repeat CBC, CMP, mag, Phos within 1 week Follow-up with palliative care at discharge and palliative care to follow-up with the facility Follow up with ID in the clinic at Discharge Follow up and Have Dental Extractions if desired Monitor PT/INR and repeat within a few days to ensure that INR still subtherapeutic Folllow up with Cardiology within 1-2 weeks  Discharge Diagnoses: Principal Problem:   Sepsis due to Streptococcus, group B (HCC) Active Problems:   Dyslipidemia   OBSTRUCTIVE SLEEP APNEA   ATRIAL FIBRILLATION   Nonrheumatic aortic valve stenosis   CAP (community acquired pneumonia)   HTN (hypertension)   CAD (coronary artery disease)   Aortic dilatation (HCC)   Bacteremia  Resolved Problems:   * No resolved hospital problems. [facility] Course: The patient is a 72 year old Caucasian male with a past medical history significant for but not to HTN, moderate aortic stenosis, mitral valve prolapse, atrial fibrillation, hyperlipidemia, OSA not on CPAP, Obesity as well as other comorbidities who earlier in the week developed what he does describes as a viral illness with shakes and chills but then subsequently resolved.  He has not become weaker and weaker since and eventually fell but did not hit his head and there is no loss of consciousness.  Given his fall he is brought to the ED after he called 911 and in the ED he was found to be febrile.  Chest x-ray was done and showed cardiomegaly with pulmonary vascular congestion and possible edema versus infiltrate.  Lactic acid level was elevated at 5 and he was given IV Rocephin and azithromycin.  He is admitted for further workup and lactic acid started  downtrending and WBC is now resolved but blood cultures came back positive for Streptococcus.  He is now being treated for streptococcal bacteremia and he was placed on IV Cefazolin and further workup reveals likely the source of the infection is suspected to be dental with peridental disease with dental caries and multiple erosions.  His IV cefazolin has now been changed to oral amoxicillin to bridge maxillofacial evaluation and possible dental extraction.  Given his deconditioning PT OT have evaluated and recommending SNF and he is now agreeable but was at first hesitant to go to rehab. He is medically stable to D/C and will need follow up with PCP, ID, Palliative Care Medicine and Cardiology in the outpatient setting.   Assessment and Plan:  Sepsis Secondary to Streptococcal bacteremia and community-acquired pneumonia:  -Vitals are stable this a.m. -Leukocytosis resolved.  TTE and TEE negative for endocarditis -ID recommendations: Continue IV cefazolin 2 g every 8 hours but now changing to oral Amoxicillin 1000 mg po q8h -Continue Monitor Vitals -ID ordered imaging of teeth and it showed "Periodontal disease and alveolar resorption with exposure of the roots of the maxillary and mandibular dentition. Superimposed dental caries with multiple erosions within the residual maxillary molars. No periapical lucency identified." -ID also pending referral to [facility], [facility], [facility] for evaluation of his dental caries and anticipating extractions and likely will need a maxillofacial surgeon -PT OT recommending SNF and Anticipating D/C when Bed is avavilable and Insurance Auth obtained  -Wants to speak with Palliative Care so Consult has been placed  and goals of care being elucidated and patient will continue to manage current infection with current antibiotics and is leaning towards not having a dental extractions.  Palliative follow-up at SNF and patient's long-term goals are to be comfortable and not escalate care eventually but  would like to pursue SNF for rehab with outpatient Palliative to follow at SNF   Dyspnea and Hypoxia in the setting of Streptococcal PNA -Improved, now on room air SpO2: 98 % O2 Flow Rate (L/min): 2 L/min -Give incentive spirometry -Continuous Pulse Oximetry and Maintain O2 Sat's greater than 90% -Continue Supplemental O2 via  and Wean O2 as Tolerated -Will need an Ambulatory Home O2 Screen prior to D/C -Repeat CXR on 8/22/ AM done and showed "No evidence of acute cardiopulmonary disease. Improved/resolved probable interstitial edema."   Normocytic Anemia -Chronic. Hgb/Hct Trend: Recent Labs  Lab 10/21/22 0911 10/22/22 0651 10/23/22 0555 10/25/22 0431 10/26/22 0537 10/27/22 0014 10/28/22 0854  HGB 10.4* 10.5* 10.1* 10.4* 10.8* 10.6* 10.7*  HCT 31.1* 31.7* 30.9* 32.2* 33.3* 32.6* 33.3*  MCV 88.9 89.3 91.4 91.5 92.0 93.1 90.5  -Checked Anemia Panel and showed an iron level of 19, UIBC of 282, TIBC of 301, saturation ratios of 6%, ferritin level of 141, folate level 5.4, vitamin B12 level of 149 -Will start p.o. Niferex, p.o. folic acid supplementation and B12 supplementation -Continue to Monitor for S/Sx of Bleeding; No overt Bleeding noted -Repeat CBC intermittently and within 1 week  Hypokalemia, improved -Patient's K+ Level Trend: Recent Labs  Lab 10/21/22 0911 10/22/22 0651 10/23/22 0555 10/25/22 0431 10/26/22 0537 10/27/22 0014 10/28/22 0854  K 3.0* 4.4 4.9 4.0 4.3 4.5 3.7  -Continue to Monitor and Replete as Necessary -Repeat CMP within 1 week   Obstructive Sleep Apnea -Give incentive spirometry -Does not use CPAP at home. -Would benefit from CPAP at bedtime.   Chronic Atrial Fibrillation -Rate controlled -CHA?DS?-VASc of at least 3. -INR is now  2.0 this a.m. -Continue Warfarin per Pharmacy and he received a 7.5 mg dose yesterday; -Follow-up PT/INR at SNF -On Carvedilol 12.5 mg po BID -Continue to Monitor on Telemetry  Hyponatremia -In the setting  of Hydrochlorothiazide use -Will Hold and Discontinue Hydrochlorothiazide -Na+ Trend:  Recent Labs  Lab 10/21/22 0911 10/22/22 0651 10/23/22 0555 10/25/22 0431 10/26/22 0537 10/27/22 0014 10/28/22 0854  NA 135 137 134* 134* 133* 132* 134*  -Replete with IV Sodium Phos 15 mmol a few days ago -Continue to Monitor and Trend and repeat CMP intermittently and within 1 week  Hypophosphatemia -Phos Level Trend: Recent Labs  Lab 10/21/22 0907 10/26/22 0537 10/27/22 0014 10/28/22 0854  PHOS 1.5* 2.9 2.3* 2.3*  -Continue to Monitor and Replete as Necessary -Repeat Phos Level intermittently and within 1 week   Nonrheumatic aortic valve stenosis -Continue Coumadin   CAD (Coronary Artery Disease) -No acute chest pain symptoms -On warfarin per pharmacy. -Continue Losartan 100 mg p.o. daily and Carvedilol 12.5 mg po BID -Continue to Monitor and Trend   HTN (Hypertension) -Mildly elevated blood pressure. -Continue ARB with Losartan 100 mg po Daily, Clonidine 0.2 mg po BID, Carvedilol 12.5 mg po BID, and Doxazosin 4 mg po Daily  -Will Discontinue Hydrochlorothiazide 12.5 mg p.o. daily due to Hyponatremia -Continue to Monitor BP per Protocol and Last BP reading was 118/71   Aortic Dilatation Moderate Aortic Stenosis Mitral regurgitation -EF 60-65% no RWMA, small pericardial effusion,  -Mitral valve is normal and Aortic valve is normal  -Mild dilatation of aortic root and ascending  aorta no interatrial shunt.  -Needs Cardiology Follow up at DC    Dyslipidemia -Currently not on medical therapy except Omega-3 Acid Ethyl Esters 1 gram po Daily -Follow-up with PCP.  Constipation -C/w Senna-docusate 1 tab po at bedtime prn and will add Bisacodyl 10 mg RC Dailyprn Moderate Constipation; Will try another Suppository  -C/w Lubiprostone 24 mcg po BID -Has an Adverse Effect to Miralax so will avoid    Physical Debility/Generalized Deconditioning:  -PT recommended home health as  patient was declining rehab.   -Unable to take care of himself and Likely will need rehab and now PT/OT recommending SNF and Nephew and Patient in agreement  Hypoalbuminemia -Patient's Albumin Trend: Recent Labs  Lab 10/17/22 1929 10/26/22 0537 10/27/22 0014 10/28/22 0854  ALBUMIN 2.9* 2.4* 2.4* 2.5*  -Continue to Monitor and Trend and repeat CMP intermittently   Obesity -Complicates overall prognosis and care -Estimated body mass index is 33.72 kg/m as calculated from the following:   Height as of this encounter: 5\' 10"  (1.778 m).   Weight as of this encounter: 106.6 kg.  -Weight Loss and Dietary Counseling given  Consultants: Infectious Diseases; Palliative Care Medicine, Cardiology for TEE Procedures performed: As delineated as above  Disposition: Skilled nursing facility  Diet recommendation:  Cardiac diet  DISCHARGE MEDICATION: Allergies as of 10/30/2022       Reactions   Statins Other (See Comments)   "Muscles were messed up"         Medication List     STOP taking these medications    hydrochlorothiazide 12.5 MG capsule Commonly known as: MICROZIDE       TAKE these medications    acetaminophen 325 MG tablet Commonly known as: TYLENOL Take 2 tablets (650 mg total) by mouth every 6 (six) hours as needed for mild pain (or Fever >/= 101). What changed:  when to take this reasons to take this   albuterol (2.5 MG/3ML) 0.083% nebulizer solution Commonly known as: PROVENTIL Take 3 mLs (2.5 mg total) by nebulization every 2 (two) hours as needed for wheezing or shortness of breath.   ammonium lactate 12 % lotion Commonly known as: LAC-HYDRIN Apply topically 2 (two) times daily.   amoxicillin 500 MG capsule Commonly known as: AMOXIL Take 2 capsules (1,000 mg total) by mouth every 8 (eight) hours for 4 days.   amoxicillin 500 MG capsule Commonly known as: AMOXIL Take 1 capsule (500 mg total) by mouth every 12 (twelve) hours for 14 doses. Start  taking on: November 03, 2022   bisacodyl 10 MG suppository Commonly known as: DULCOLAX Place 1 suppository (10 mg total) rectally daily as needed for moderate constipation.   carvedilol 12.5 MG tablet Commonly known as: COREG Take 1 tablet (12.5 mg total) by mouth 2 (two) times daily with a meal.   cloNIDine 0.2 MG tablet Commonly known as: CATAPRES Take 1 tablet (0.2 mg total) by mouth 2 (two) times daily.   cyanocobalamin 1000 MCG tablet Take 1 tablet (1,000 mcg total) by mouth daily. Start taking on: October 31, 2022   diphenhydrAMINE 50 MG/ML injection Commonly known as: BENADRYL Inject 0.5 mLs (25 mg total) into the vein once as needed (if patient exhibits significant signs and symptoms of allergic reaction.).   doxazosin 4 MG tablet Commonly known as: CARDURA Take 1 tablet (4 mg total) by mouth daily.   EPINEPHrine 0.3 mg/0.3 mL Soaj injection Commonly known as: EPI-PEN Inject 0.3 mg into the muscle once as needed (if patient exhibits significant  signs and symptoms of allergic reaction.).   folic acid 1 MG tablet Commonly known as: FOLVITE Take 1 tablet (1 mg total) by mouth daily. Start taking on: October 31, 2022   iron polysaccharides 150 MG capsule Commonly known as: NIFEREX Take 1 capsule (150 mg total) by mouth daily. Start taking on: October 31, 2022   losartan 100 MG tablet Commonly known as: COZAAR Take 1 tablet (100 mg total) by mouth daily.   lubiprostone 24 MCG capsule Commonly known as: AMITIZA Take 1 capsule (24 mcg total) by mouth 2 (two) times daily.   magnesium oxide 400 (240 Mg) MG tablet Commonly known as: MAG-OX Take 1 tablet (400 mg total) by mouth 2 (two) times daily.   Nexlizet 180-10 MG Tabs Generic drug: Bempedoic Acid-Ezetimibe Take 1 tablet by mouth daily. (Stop taking ezetimibe when you start taking Nexlizet)   OMEGA-3 FATTY ACIDS PO Take 1 capsule by mouth in the morning and at bedtime.   potassium chloride SA 20 MEQ  tablet Commonly known as: KLOR-CON M Take 1 tablet (20 mEq total) by mouth daily. Start taking on: October 31, 2022   senna-docusate 8.6-50 MG tablet Commonly known as: Senokot-S Take 1 tablet by mouth at bedtime as needed for mild constipation.   warfarin 5 MG tablet Commonly known as: COUMADIN Take as directed. If you are unsure how to take this medication, talk to your nurse or doctor. Original instructions: Take 1.5 tablets (7.5 mg total) by mouth daily. Patient takes 7.5 mg (1.5 tabs) MWF and 5 mg (1 tab) all other days               Discharge Care Instructions  (From admission, onward)           Start     Ordered   10/30/22 0000  Discharge wound care:       Comments: Cleanse with Vashe pure hypochlorous acid [place] # [id number]). Apply Vashe to an ABD pad and place over desquamated (dry, cracked) areas on RLE and allow to soak for 10 minutes. Remove and pat dry. Apply Manuka honey to LE, top with ABD pad and secure with Kerlix roll gause/paper tape.  Place foot into Prevalon boot.   10/30/22 1018            Follow-up Information     [facility] Follow up.   Why: Iantha Fallen will continue to provide home health services.  They will call you to re-start services when you return home - within 24-48 hours. Contact information: [address] [place] [address] [phone]                Discharge Exam: Filed Weights   10/26/22 0600 10/28/22 0519 10/29/22 0540  Weight: 109 kg 104.3 kg 106.6 kg   Vitals:   10/30/22 0617 10/30/22 0753  BP: 123/72 118/71  Pulse: (!) 45 70  Resp: 17 16  Temp: 98 F (36.7 C) 98.1 F (36.7 C)  SpO2: 98% 98%   Examination: Physical Exam:  Constitutional: WN/WD obese Caucasian male in NAD appears calm Respiratory: Diminished to auscultation bilaterally, no wheezing, rales, rhonchi or crackles. Normal respiratory effort and patient is not tachypenic. No accessory muscle use.  Unlabored breathing   Cardiovascular: RRR, no murmurs / rubs / gallops. S1 and S2 auscultated. Trace edema Abdomen: Soft, non-tender, Distended 2/2 body habitus. Bowel sounds positive.  GU: Deferred. Musculoskeletal: No clubbing / cyanosis of digits/nails. No joint deformity upper and lower extremities.  Neurologic: CN 2-12 grossly intact with no focal deficits.  Romberg sign and cerebellar reflexes not assessed.  Psychiatric: Normal judgment and insight. Alert and oriented x 3. Normal mood and appropriate affect.   Condition at discharge: stable  The results of significant diagnostics from this hospitalization (including imaging, microbiology, ancillary and laboratory) are listed below for reference.   Imaging Studies: DG CHEST PORT 1 VIEW  Result Date: 10/27/2022 CLINICAL DATA:  Shortness of breath. EXAM: PORTABLE CHEST 1 VIEW COMPARISON:  Chest x-ray 10/18/2022. FINDINGS: Low lung volumes. Improved interstitial opacities. Clear lungs. No consolidation. No visible pleural effusions or pneumothorax. Cardiomediastinal silhouette is similar to prior and accentuated by technique. IMPRESSION: No evidence of acute cardiopulmonary disease. Improved/resolved probable interstitial edema. Electronically Signed   By: Feliberto Harts M.D.   On: 10/27/2022 09:30   CT MAXILLOFACIAL WO CONTRAST  Result Date: 10/26/2022 CLINICAL DATA:  Bacteremia EXAM: CT MAXILLOFACIAL WITHOUT CONTRAST TECHNIQUE: Multidetector CT imaging of the maxillofacial structures was performed. Multiplanar CT image reconstructions were also generated. RADIATION DOSE REDUCTION: This exam was performed according to the departmental dose-optimization program which includes automated exposure control, adjustment of the mA and/or kV according to patient size and/or use of iterative reconstruction technique. COMPARISON:  None Available. FINDINGS: Osseous: The facial bones are intact. No mandibular dislocation. There are numerous absent  maxillary teeth. The posterior right maxillary premolar appears eroded. There is periodontal disease in maxillary resorption noted with exposure of the roots of the maxillary dentition. Superimposed dental caries noted with multiple erosions within the mid residual maxillary molars. Periodontal disease and maxillary resorption similarly exposes the roots of the mandibular dentition. No periapical lucency identified. Orbits: Negative. No traumatic or inflammatory finding. Sinuses: Clear. Soft tissues: Negative. Limited intracranial: No significant or unexpected finding. IMPRESSION: 1. Periodontal disease and alveolar resorption with exposure of the roots of the maxillary and mandibular dentition. Superimposed dental caries with multiple erosions within the residual maxillary molars. No periapical lucency identified. Electronically Signed   By: Helyn Numbers M.D.   On: 10/26/2022 00:42   ECHO TEE  Result Date: 10/25/2022    TRANSESOPHOGEAL ECHO REPORT   Patient Name:   [name] Date of Exam: 10/25/2022 Medical Rec #:  [id number]    Height:       70.0 in Accession #:    [id number]   Weight:       242.3 lb Date of Birth:  07/02/1950    BSA:          2.265 m Patient Age:    71 years     BP:           143/83 mmHg Patient Gender: M            HR:           74 bpm. Exam Location:  Inpatient Procedure: 3D Echo, Transesophageal Echo, Cardiac Doppler and Color Doppler Indications:     Bacteremia.  History:         Patient has prior history of Echocardiogram examinations, most                  recent 10/19/2022. CAD, Abnormal ECG, Aortic Valve Disease and                  Mitral Valve Disease, Arrythmias:Atrial Fibrillation and                  Bradycardia, Signs/Symptoms:Chest Pain and  Dizziness/Lightheadedness; Risk Factors:Hypertension and Sleep                  Apnea. Aortic stenosis.  Sonographer:     Sheralyn Boatman RDCS Referring Phys:  [id number] Jonita Albee Diagnosing Phys: Chilton Si MD  PROCEDURE: After discussion of the risks and benefits of a TEE, an informed consent was obtained from the patient. The transesophogeal probe was passed without difficulty through the esophogus of the patient. Sedation performed by different physician. The patient was monitored while under deep sedation. Anesthestetic sedation was provided intravenously by Anesthesiology: 198mg  of Propofol, 20mg  of Lidocaine. The patient's vital signs; including heart rate, blood pressure, and oxygen saturation; remained stable throughout the procedure. The patient developed no complications during the procedure.  IMPRESSIONS  1. Left ventricular ejection fraction, by estimation, is 55 to 60%. The left ventricle has normal function. The left ventricle has no regional wall motion abnormalities.  2. Right ventricular systolic function is normal. The right ventricular size is normal.  3. No left atrial/left atrial appendage thrombus was detected. The LAA emptying velocity was 35 cm/s.  4. A small pericardial effusion is present.  5. The mitral valve is myxomatous. Mild mitral valve regurgitation. No evidence of mitral stenosis. There is mild prolapse of the medial segment of the anterior leaflet of the mitral valve. Moderate mitral annular calcification.  6. Fusion of the left and non-coronary cusps. The aortic valve is calcified. Aortic valve regurgitation is mild to moderate. Moderate aortic valve stenosis. Aortic valve area, by VTI measures 1.46 cm. Aortic valve mean gradient measures 28.0 mmHg. Aortic valve Vmax measures 3.48 m/s.  7. Aortic dilatation noted. There is mild dilatation of the ascending aorta, measuring 42 mm.  8. The inferior vena cava is normal in size with greater than 50% respiratory variability, suggesting right atrial pressure of 3 mmHg. Conclusion(s)/Recommendation(s): Normal biventricular function without evidence of hemodynamically significant valvular heart disease. No evidence of vegetation/infective  endocarditis on this transesophageael echocardiogram. FINDINGS  Left Ventricle: Left ventricular ejection fraction, by estimation, is 55 to 60%. The left ventricle has normal function. The left ventricle has no regional wall motion abnormalities. The left ventricular internal cavity size was normal in size. There is  no left ventricular hypertrophy. Right Ventricle: The right ventricular size is normal. No increase in right ventricular wall thickness. Right ventricular systolic function is normal. Left Atrium: Left atrial size was normal in size. No left atrial/left atrial appendage thrombus was detected. The LAA emptying velocity was 35 cm/s. Right Atrium: Right atrial size was normal in size. Pericardium: A small pericardial effusion is present. Mitral Valve: The mitral valve is myxomatous. There is mild prolapse of the medial segment of the anterior leaflet of the mitral valve. There is mild thickening of the mitral valve leaflet(s). Moderate mitral annular calcification. Mild mitral valve regurgitation. No evidence of mitral valve stenosis. MV peak gradient, 8.5 mmHg. The mean mitral valve gradient is 2.0 mmHg. Tricuspid Valve: The tricuspid valve is normal in structure. Tricuspid valve regurgitation is mild . No evidence of tricuspid stenosis. Aortic Valve: Fusion of the left and non-coronary cusps. The aortic valve is calcified. Aortic valve regurgitation is mild to moderate. Aortic regurgitation PHT measures 636 msec. Moderate aortic stenosis is present. Aortic valve mean gradient measures 28.0 mmHg. Aortic valve peak gradient measures 48.4 mmHg. Aortic valve area, by VTI measures 1.46 cm. Pulmonic Valve: The pulmonic valve was normal in structure. Pulmonic valve regurgitation is mild. No evidence of pulmonic stenosis.  Aorta: Aortic dilatation noted. There is mild dilatation of the ascending aorta, measuring 42 mm. Venous: The inferior vena cava is normal in size with greater than 50% respiratory  variability, suggesting right atrial pressure of 3 mmHg. IAS/Shunts: No atrial level shunt detected by color flow Doppler. Additional Comments: Spectral Doppler performed. LEFT VENTRICLE PLAX 2D LVOT diam:     2.50 cm LV SV:         115 LV SV Index:   51 LVOT Area:     4.91 cm  AORTIC VALVE AV Area (Vmax):    1.57 cm AV Area (Vmean):   1.64 cm AV Area (VTI):     1.46 cm AV Vmax:           348.00 cm/s AV Vmean:          229.667 cm/s AV VTI:            0.786 m AV Peak Grad:      48.4 mmHg AV Mean Grad:      28.0 mmHg LVOT Vmax:         111.00 cm/s LVOT Vmean:        76.700 cm/s LVOT VTI:          0.234 m LVOT/AV VTI ratio: 0.30 AI PHT:            636 msec  AORTA Ao Asc diam: 4.20 cm MITRAL VALVE MV Area VTI:  3.37 cm        SHUNTS MV Peak grad: 8.5 mmHg        Systemic VTI:  0.23 m MV Mean grad: 2.0 mmHg        Systemic Diam: 2.50 cm MV Vmax:      1.46 m/s MV Vmean:     60.7 cm/s MR Peak grad:    118.8 mmHg MR Mean grad:    79.0 mmHg MR Vmax:         545.00 cm/s MR Vmean:        428.0 cm/s MR PISA:         1.01 cm MR PISA Eff ROA: 8 mm MR PISA Radius:  0.40 cm Chilton Si MD Electronically signed by Chilton Si MD Signature Date/Time: 10/25/2022/8:30:04 AM    Final    EP STUDY  Result Date: 10/25/2022 See surgical note for result.  ECHOCARDIOGRAM COMPLETE  Result Date: 10/19/2022    ECHOCARDIOGRAM REPORT   Patient Name:   [name] Date of Exam: 10/19/2022 Medical Rec #:  [id number]    Height:       70.0 in Accession #:    [id number]   Weight:       242.3 lb Date of Birth:  [birth year]    BSA:          2.265 m Patient Age:    71 years     BP:           171/100 mmHg Patient Gender: M            HR:           101 bpm. Exam Location:  Inpatient Procedure: 2D Echo, Cardiac Doppler and Color Doppler Indications:    Bacteremia  History:        Patient has prior history of Echocardiogram examinations, most                 recent 12/15/2021. CAD and Previous Myocardial Infarction,  Arrythmias:Atrial Fibrillation,                 Signs/Symptoms:Dizziness/Lightheadedness; Risk                 Factors:Dyslipidemia, Sleep Apnea and Hypertension.  Sonographer:    Wallie Char Referring Phys: Lanae Boast  Sonographer Comments: Image acquisition challenging due to patient body habitus. IMPRESSIONS  1. Left ventricular ejection fraction, by estimation, is 60 to 65%. The left ventricle has normal function. The left ventricle has no regional wall motion abnormalities. There is mild concentric left ventricular hypertrophy. Left ventricular diastolic function could not be evaluated. Elevated left atrial pressure.  2. Right ventricular systolic function is normal. The right ventricular size is normal.  3. A small pericardial effusion is present.  4. The mitral valve is normal in structure. Mild mitral valve regurgitation. No evidence of mitral stenosis.  5. The aortic valve is normal in structure. Aortic valve regurgitation is trivial. Moderate aortic valve stenosis.  6. Aortic dilatation noted. There is mild dilatation of the aortic root, measuring 39 mm. There is mild dilatation of the ascending aorta, measuring 41 mm.  7. The inferior vena cava is dilated in size with >50% respiratory variability, suggesting right atrial pressure of 8 mmHg. FINDINGS  Left Ventricle: Left ventricular ejection fraction, by estimation, is 60 to 65%. The left ventricle has normal function. The left ventricle has no regional wall motion abnormalities. The left ventricular internal cavity size was normal in size. There is  mild concentric left ventricular hypertrophy. Left ventricular diastolic function could not be evaluated due to atrial fibrillation. Left ventricular diastolic function could not be evaluated. Elevated left atrial pressure. Right Ventricle: The right ventricular size is normal. Right ventricular systolic function is normal. Left Atrium: Left atrial size was normal in size. Right Atrium: Right atrial size  was normal in size. Pericardium: A small pericardial effusion is present. Mitral Valve: The mitral valve is normal in structure. Mild mitral valve regurgitation. No evidence of mitral valve stenosis. MV peak gradient, 7.1 mmHg. The mean mitral valve gradient is 4.0 mmHg. Tricuspid Valve: The tricuspid valve is normal in structure. Tricuspid valve regurgitation is trivial. No evidence of tricuspid stenosis. Aortic Valve: The aortic valve is normal in structure. Aortic valve regurgitation is trivial. Aortic regurgitation PHT measures 684 msec. Moderate aortic stenosis is present. Aortic valve mean gradient measures 24.0 mmHg. Aortic valve peak gradient measures 44.7 mmHg. Aortic valve area, by VTI measures 1.49 cm. Pulmonic Valve: The pulmonic valve was normal in structure. Pulmonic valve regurgitation is trivial. No evidence of pulmonic stenosis. Aorta: Aortic dilatation noted. There is mild dilatation of the aortic root, measuring 39 mm. There is mild dilatation of the ascending aorta, measuring 41 mm. Venous: The inferior vena cava is dilated in size with greater than 50% respiratory variability, suggesting right atrial pressure of 8 mmHg. IAS/Shunts: No atrial level shunt detected by color flow Doppler.  LEFT VENTRICLE PLAX 2D LVIDd:         5.10 cm     Diastology LVIDs:         3.60 cm     LV e' medial:    7.84 cm/s LV PW:         1.00 cm     LV E/e' medial:  17.0 LV IVS:        1.10 cm     LV e' lateral:   8.33 cm/s LVOT diam:     1.90 cm  LV E/e' lateral: 16.0 LV SV:         93 LV SV Index:   41 LVOT Area:     2.84 cm  LV Volumes (MOD) LV vol d, MOD A2C: 94.2 ml LV vol d, MOD A4C: 95.3 ml LV vol s, MOD A2C: 43.1 ml LV vol s, MOD A4C: 42.5 ml LV SV MOD A2C:     51.1 ml LV SV MOD A4C:     95.3 ml LV SV MOD BP:      54.4 ml RIGHT VENTRICLE             IVC RV Basal diam:  4.00 cm     IVC diam: 2.80 cm RV S prime:     12.47 cm/s TAPSE (M-mode): 2.2 cm LEFT ATRIUM             Index        RIGHT ATRIUM            Index LA diam:        4.30 cm 1.90 cm/m   RA Area:     20.90 cm LA Vol (A2C):   77.1 ml 34.05 ml/m  RA Volume:   56.40 ml  24.91 ml/m LA Vol (A4C):   69.0 ml 30.47 ml/m LA Biplane Vol: 73.9 ml 32.63 ml/m  AORTIC VALVE AV Area (Vmax):    1.40 cm AV Area (Vmean):   1.52 cm AV Area (VTI):     1.49 cm AV Vmax:           334.25 cm/s AV Vmean:          230.500 cm/s AV VTI:            0.625 m AV Peak Grad:      44.7 mmHg AV Mean Grad:      24.0 mmHg LVOT Vmax:         165.00 cm/s LVOT Vmean:        123.667 cm/s LVOT VTI:          0.328 m LVOT/AV VTI ratio: 0.52 AI PHT:            684 msec  AORTA Ao Root diam: 3.90 cm Ao Asc diam:  4.10 cm MITRAL VALVE                TRICUSPID VALVE MV Area (PHT): 4.22 cm     TR Peak grad:   29.8 mmHg MV Area VTI:   4.02 cm     TR Vmax:        273.00 cm/s MV Peak grad:  7.1 mmHg MV Mean grad:  4.0 mmHg     SHUNTS MV Vmax:       1.33 m/s     Systemic VTI:  0.33 m MV Vmean:      93.0 cm/s    Systemic Diam: 1.90 cm MV Decel Time: 180 msec MV E velocity: 133.67 cm/s Olga Millers MD Electronically signed by Olga Millers MD Signature Date/Time: 10/19/2022/10:20:40 AM    Final    DG Chest Port 1 View  Result Date: 10/18/2022 CLINICAL DATA:  Shortness of breath EXAM: PORTABLE CHEST 1 VIEW COMPARISON:  10/17/2022 FINDINGS: Cardiac enlargement. Diffuse interstitial pattern to the lungs likely representing edema. Similar appearance to previous study. No pleural effusion or pneumothorax. Mediastinal contours appear intact. IMPRESSION: Cardiac enlargement with diffuse interstitial pattern to the lungs likely edema. No change. Electronically Signed   By: Burman Nieves M.D.   On: 10/18/2022  18:54   DG Chest Port 1 View  Result Date: 10/17/2022 CLINICAL DATA:  Cough. EXAM: PORTABLE CHEST 1 VIEW COMPARISON:  08/11/2022. FINDINGS: The heart is enlarged and the mediastinal contour is stable. There is atherosclerotic calcification of the aorta. The pulmonary vasculature is mildly  distended. Interstitial prominence is present bilaterally. Subsegmental atelectasis or scarring is noted bilaterally. No effusion or pneumothorax. No acute osseous abnormality. IMPRESSION: 1. Cardiomegaly with pulmonary vascular congestion. 2. Interstitial prominence bilaterally, possible edema or infiltrate. Electronically Signed   By: Thornell Sartorius M.D.   On: 10/17/2022 20:09    Microbiology: Results for orders placed or performed during the hospital encounter of 10/17/22  SARS Coronavirus 2 by RT PCR (hospital order, performed in [facility] hospital lab) *cepheid single result test* Anterior Nasal Swab     Status: None   Collection Time: 10/17/22  7:29 PM   Specimen: Anterior Nasal Swab  Result Value Ref Range Status   SARS Coronavirus 2 by RT PCR NEGATIVE NEGATIVE Final    Comment: Performed at [facility], [address], [place], [place] [address]  Culture, blood (Routine X 2) w Reflex to ID Panel     Status: Abnormal   Collection Time: 10/17/22  8:15 PM   Specimen: BLOOD  Result Value Ref Range Status   Specimen Description BLOOD LEFT ANTECUBITAL  Final   Special Requests   Final    BOTTLES DRAWN AEROBIC AND ANAEROBIC Blood Culture adequate volume   Culture  Setup Time   Final    GRAM POSITIVE COCCI IN CHAINS AEROBIC BOTTLE ONLY Organism ID to follow CRITICAL RESULT CALLED TO, READ BACK BY AND VERIFIED WITH: Joellen Jersey PHARMD, AT 1331 10/18/22 Renato Shin Performed at [facility], [address] [place], [place] [address]    Culture GROUP B STREP(S.AGALACTIAE)ISOLATED (A)  Final   Report Status 10/20/2022 FINAL  Final   Organism ID, Bacteria GROUP B STREP(S.AGALACTIAE)ISOLATED  Final      Susceptibility   Group b strep(s.agalactiae)isolated - MIC*    CLINDAMYCIN RESISTANT Resistant     AMPICILLIN <=0.25 SENSITIVE Sensitive     ERYTHROMYCIN 2 RESISTANT Resistant     VANCOMYCIN 0.5 SENSITIVE Sensitive     CEFTRIAXONE <=0.12 SENSITIVE Sensitive     LEVOFLOXACIN 1  SENSITIVE Sensitive     PENICILLIN <=0.06 SENSITIVE Sensitive     * GROUP B STREP(S.AGALACTIAE)ISOLATED  Blood Culture ID Panel (Reflexed)     Status: Abnormal   Collection Time: 10/17/22  8:15 PM  Result Value Ref Range Status   Enterococcus faecalis NOT DETECTED NOT DETECTED Final   Enterococcus Faecium NOT DETECTED NOT DETECTED Final   Listeria monocytogenes NOT DETECTED NOT DETECTED Final   Staphylococcus species NOT DETECTED NOT DETECTED Final   Staphylococcus aureus (BCID) NOT DETECTED NOT DETECTED Final   Staphylococcus epidermidis NOT DETECTED NOT DETECTED Final   Staphylococcus lugdunensis NOT DETECTED NOT DETECTED Final   Streptococcus species DETECTED (A) NOT DETECTED Final    Comment: CRITICAL RESULT CALLED TO, READ BACK BY AND VERIFIED WITH: S. MOODY PHARMD, AT 1331 10/18/22 D. VANHOOK    Streptococcus agalactiae DETECTED (A) NOT DETECTED Final    Comment: CRITICAL RESULT CALLED TO, READ BACK BY AND VERIFIED WITH: S. MOODY PHARMD, AT 1331 10/18/22 D. VANHOOK    Streptococcus pneumoniae NOT DETECTED NOT DETECTED Final   Streptococcus pyogenes NOT DETECTED NOT DETECTED Final   A.calcoaceticus-baumannii NOT DETECTED NOT DETECTED Final   Bacteroides fragilis NOT DETECTED NOT DETECTED  Final   Enterobacterales NOT DETECTED NOT DETECTED Final   Enterobacter cloacae complex NOT DETECTED NOT DETECTED Final   Escherichia coli NOT DETECTED NOT DETECTED Final   Klebsiella aerogenes NOT DETECTED NOT DETECTED Final   Klebsiella oxytoca NOT DETECTED NOT DETECTED Final   Klebsiella pneumoniae NOT DETECTED NOT DETECTED Final   Proteus species NOT DETECTED NOT DETECTED Final   Salmonella species NOT DETECTED NOT DETECTED Final   Serratia marcescens NOT DETECTED NOT DETECTED Final   Haemophilus influenzae NOT DETECTED NOT DETECTED Final   Neisseria meningitidis NOT DETECTED NOT DETECTED Final   Pseudomonas aeruginosa NOT DETECTED NOT DETECTED Final   Stenotrophomonas maltophilia NOT  DETECTED NOT DETECTED Final   Candida albicans NOT DETECTED NOT DETECTED Final   Candida auris NOT DETECTED NOT DETECTED Final   Candida glabrata NOT DETECTED NOT DETECTED Final   Candida krusei NOT DETECTED NOT DETECTED Final   Candida parapsilosis NOT DETECTED NOT DETECTED Final   Candida tropicalis NOT DETECTED NOT DETECTED Final   Cryptococcus neoformans/gattii NOT DETECTED NOT DETECTED Final    Comment: Performed at [facility], [address], [place], [place] [address]  Culture, blood (Routine X 2) w Reflex to ID Panel     Status: None   Collection Time: 10/20/22 11:52 AM   Specimen: BLOOD LEFT ARM  Result Value Ref Range Status   Specimen Description BLOOD LEFT ARM  Final   Special Requests   Final    BOTTLES DRAWN AEROBIC AND ANAEROBIC Blood Culture adequate volume   Culture   Final    NO GROWTH 5 DAYS Performed at [facility], [address] [place], [place] [address]    Report Status 10/25/2022 FINAL  Final  Culture, blood (Routine X 2) w Reflex to ID Panel     Status: None   Collection Time: 10/20/22 11:52 AM   Specimen: BLOOD LEFT HAND  Result Value Ref Range Status   Specimen Description BLOOD LEFT HAND  Final   Special Requests   Final    BOTTLES DRAWN AEROBIC AND ANAEROBIC Blood Culture adequate volume   Culture   Final    NO GROWTH 5 DAYS Performed at [facility], [address], [place], [place] [address]    Report Status 10/25/2022 FINAL  Final    Labs: CBC: Recent Labs  Lab 10/25/22 0431 10/26/22 0537 10/27/22 0014 10/28/22 0854  WBC 7.4 8.3 9.3 7.7  NEUTROABS  --  5.7 6.8 5.7  HGB 10.4* 10.8* 10.6* 10.7*  HCT 32.2* 33.3* 32.6* 33.3*  MCV 91.5 92.0 93.1 90.5  PLT 330 332 338 324   Basic Metabolic Panel: Recent Labs  Lab 10/25/22 0431 10/26/22 0537 10/27/22 0014 10/28/22 0854  NA 134* 133* 132* 134*  K 4.0 4.3 4.5 3.7  CL 100 100 101 100  CO2 26 25 24 25   GLUCOSE 105* 104* 119* 131*  BUN 22 19 22 20   CREATININE  0.88 0.73 0.85 0.69  CALCIUM 10.3 10.2 10.3 10.0  MG 2.3 2.2 2.1 2.0  PHOS  --  2.9 2.3* 2.3*   Liver Function Tests: Recent Labs  Lab 10/26/22 0537 10/27/22 0014 10/28/22 0854  AST 17 18 13*  ALT 7 9 7   ALKPHOS 81 83 92  BILITOT 0.4 0.5 0.6  PROT 5.9* 6.0* 6.3*  ALBUMIN 2.4* 2.4* 2.5*   CBG: Recent Labs  Lab 10/29/22 1630  GLUCAP 127*   Discharge time spent: greater than 30 minutes.  Signed:  Marguerita Merles, DO [facility] 10/30/2022

## 2022-10-30 NOTE — Progress Notes (Signed)
ANTICOAGULATION CONSULT NOTE - Follow Up Consult  Pharmacy Consult for Warfarin Indication: atrial fibrillation  Allergies  Allergen Reactions   Statins Other (See Comments)    "Muscles were messed up"     Patient Measurements: Height: 5\' 10"  (177.8 cm) Weight: 106.6 kg (235 lb) IBW/kg (Calculated) : 73  Vital Signs: Temp: 98 F (36.7 C) (08/25 0617) Temp Source: Oral (08/25 0617) BP: 123/72 (08/25 0617) Pulse Rate: 45 (08/25 0617)  Labs: Recent Labs    10/28/22 0854 10/29/22 0359 10/30/22 0452  HGB 10.7*  --   --   HCT 33.3*  --   --   PLT 324  --   --   LABPROT 22.0* 21.9* 23.1*  INR 1.9* 1.9* 2.0*  CREATININE 0.69  --   --     Estimated Creatinine Clearance: 102 mL/min (by C-G formula based on SCr of 0.69 mg/dL).  Assessment: 72 y/o M presented to the ED on 10/17/22 with generalized weakness. On warfarin PTA for afib. INR supratherapeutic on admit at 3.8; trended down close to therapeutic at 3.3 on 8/13 and warfarin 5 mg x 1 was given (missed dose 8/12 PTA). INR trended back up to 4.4 on 8/14 and doses were held 8/14 and 8/15 when INR remained >3.    PTA warfarin regimen: 7.5 mg MWF and 5 mg TTSS - last outpatient INR 2.2 on 7/31   INR therapeutic at 2.0 today. He has been stable on the new daily regimen but we may need to alter the home regimen a little since he will likely need more than 5mg  daily.  Goal of Therapy:  INR 2-3 Monitor platelets by anticoagulation protocol: Yes   Plan:  Warfarin 7.5mg  PO x1 INR daily Monitor for signs/symptoms of bleeding and DDI.  Thank you for involving pharmacy in the patient's care.   Theotis Burrow, PharmD PGY1 Acute Care Pharmacy Resident  10/30/2022 7:53 AM

## 2022-10-30 NOTE — Plan of Care (Signed)

## 2022-10-30 NOTE — TOC Transition Note (Signed)
Transition of Care [facility]) - CM/SW Discharge Note   Patient Details  Name: [name] MRN: [id number] Date of Birth: 1950-09-13  [facility] CM/SW Contact:  Deatra Robinson, [place] Phone Number: 10/30/2022, 11:59 AM   Clinical Narrative: [facility] for [facility] to [facility] today. Pt aware of dc and reports agreeable. Per [place] in [facility] admissions, pt's nephew completed admission paperwork today and they are prepared to admit pt to room 707. RN provided with number for report and [facility] arranged for transport. SW signing off at dc.   Dellie Burns, MSW, LCSW [phone] (coverage)        Final next level of care: Skilled Nursing Facility Barriers to Discharge: No Barriers Identified   Patient Goals and CMS Choice CMS Medicare.gov Compare Post Acute Care list provided to:: Patient Choice offered to / list presented to : Patient  Discharge Placement                [facility] Patient to be transferred to facility by: [facility] Name of family member notified: Johnathan/nephew Patient and family notified of of transfer: 10/30/22  Discharge Plan and Services Additional resources added to the After Visit Summary for                                       Social Determinants of Health (SDOH) Interventions SDOH Screenings   Food Insecurity: No Food Insecurity (10/18/2022)  Housing: Low Risk  (10/18/2022)  Transportation Needs: No Transportation Needs (10/18/2022)  Utilities: Not At Risk (10/18/2022)  Alcohol Screen: Low Risk  (03/21/2022)  Depression (PHQ2-9): Low Risk  (09/09/2022)  Financial Resource Strain: Low Risk  (03/21/2022)  Physical Activity: Insufficiently Active (03/21/2022)  Social Connections: Socially Isolated (03/21/2022)  Stress: No Stress Concern Present (03/21/2022)  Tobacco Use: Low Risk  (10/25/2022)     Readmission Risk Interventions    10/18/2022    4:24 PM  Readmission Risk Prevention Plan  Post Dischage Appt  Complete  Medication Screening Complete  Transportation Screening Complete

## 2022-10-30 NOTE — Progress Notes (Signed)
Report called to Rodney Booze, LPN at [facility].

## 2022-10-31 DIAGNOSIS — R7881 Bacteremia: Secondary | ICD-10-CM | POA: Diagnosis not present

## 2022-10-31 DIAGNOSIS — B955 Unspecified streptococcus as the cause of diseases classified elsewhere: Secondary | ICD-10-CM | POA: Diagnosis not present

## 2022-10-31 DIAGNOSIS — J189 Pneumonia, unspecified organism: Secondary | ICD-10-CM | POA: Diagnosis not present

## 2022-10-31 DIAGNOSIS — R531 Weakness: Secondary | ICD-10-CM | POA: Diagnosis not present

## 2022-11-01 DIAGNOSIS — I4891 Unspecified atrial fibrillation: Secondary | ICD-10-CM | POA: Diagnosis not present

## 2022-11-01 DIAGNOSIS — R7881 Bacteremia: Secondary | ICD-10-CM | POA: Diagnosis not present

## 2022-11-01 DIAGNOSIS — E871 Hypo-osmolality and hyponatremia: Secondary | ICD-10-CM | POA: Diagnosis not present

## 2022-11-01 DIAGNOSIS — I2510 Atherosclerotic heart disease of native coronary artery without angina pectoris: Secondary | ICD-10-CM | POA: Diagnosis not present

## 2022-11-01 DIAGNOSIS — D649 Anemia, unspecified: Secondary | ICD-10-CM | POA: Diagnosis not present

## 2022-11-01 DIAGNOSIS — B955 Unspecified streptococcus as the cause of diseases classified elsewhere: Secondary | ICD-10-CM | POA: Diagnosis not present

## 2022-11-01 DIAGNOSIS — K5900 Constipation, unspecified: Secondary | ICD-10-CM | POA: Diagnosis not present

## 2022-11-01 DIAGNOSIS — G4733 Obstructive sleep apnea (adult) (pediatric): Secondary | ICD-10-CM | POA: Diagnosis not present

## 2022-11-01 DIAGNOSIS — R627 Adult failure to thrive: Secondary | ICD-10-CM | POA: Diagnosis not present

## 2022-11-01 DIAGNOSIS — R5381 Other malaise: Secondary | ICD-10-CM | POA: Diagnosis not present

## 2022-11-01 DIAGNOSIS — I10 Essential (primary) hypertension: Secondary | ICD-10-CM | POA: Diagnosis not present

## 2022-11-02 ENCOUNTER — Ambulatory Visit: Payer: PPO

## 2022-11-02 DIAGNOSIS — F4320 Adjustment disorder, unspecified: Secondary | ICD-10-CM | POA: Diagnosis not present

## 2022-11-02 NOTE — Progress Notes (Deleted)
Cardiology Office Note:  .   Date:  11/02/2022  ID:  [name], DOB Jul 13, 1950, MRN [id number] PCP: Blake Salisbury, MD  [facility] Providers Cardiologist:  Blake Pates, MD { Click to update primary MD,subspecialty MD or APP then REFRESH:1}   Patient Profile: .      PMH Permanent atrial fibrillation Bradycardia Monitor 08/2020 longest pause 3.9 sec >>beta-blocker reduced Mitral regurgitation  Trivial on echocardiogram 2018 Mild MR (eccentric) on echo 11/2020 Mild aortic stenosis Mean gradient 21 mmHg on echo 2018 Hypertension Hyperlipidemia OSA Morbid obesity  Longtime patient of [name], he has been maintained on Coumadin for atrial fibrillation and managed by our Coumadin clinic.  He was prescribed a cardiac monitor for bradycardia May 2022 which revealed some long pauses, longest of which was 3.9 seconds.  Carvedilol was reduced.  Follow-up echocardiogram revealed normal EF and mild MR.  The jet was eccentric and may be underestimated.  It was reviewed with [name] and she recommended he proceed with TEE to better evaluate MR.  He is sedentary due to back and hip problems and travels in a wheelchair.  He uses a walker at home. He was asymptomatic.  Due to sedentary nature and comorbid conditions, decided to hold off on pursuing TEE.  He remained asymptomatic at follow-up visit with [name] on 10/27/2021.  Admission 10/10-10/18/23 for weakness and indigestion.  Mild elevation in troponin 51>>196>>334>>204.  CT chest abdomen and pelvis with extensive coronary calcifications noted.  Lipoprotein a 123, LDL 92.  Recommendation for right and left heart catheterization to define coronary anatomy. R/LHC revealed small nondominant RCA 100% CTO mid vessel, diffusely diseased LAD with no obvious focal lesion, large-caliber mostly dominant LCx with 2 major OM branches, 2 PL's and a PDA with disease as outlined above. Decision for aggressive medical therapy for A-fib and CAD.  If  progression of angina or worsening CHF plan to treat OM and LCx lesions medically.  Discharged to SNF on 12/22/2021.  Seen by [name] 08/31/22. Recent hospitalization with dizziness.  BP found to be significantly elevated although was normal at PCP office the previous day.  MRI and CT negative for CVA.  Cardiology was consulted.  Amlodipine was added.  Renal artery duplex negative for RAS.  The patient declined amlodipine at discharge due to bad experience with hypotension in the past.  He has BP was stable with nurses' cuff at home. No changes were made to treatment regimen and 5 month follow-up was recommended.  Admission 8/12-8/25/24, presented with weakness, chills.  Blood cultures were positive for Streptococcus. Patient was seen by ID who started IV antibiotics and recommended TEE.  Transthoracic echo 10/19/2022 revealed normal LVEF 60 to 65%, no RWMA, mild LVH, normal RV, mild MR, moderate aortic valve stenosis with mean gradient 24.0 mmHg, mild dilatation of aortic root 39 mm, mild dilatation of ascending aorta 41 mm.  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  He was discharged to SNF and plan for palliative care consult. Dental extractions were recommended for significant dental caries felt to be contributing to infectious disease.        History of Present Illness: .   [name] is a *** 72 y.o. male who is here today for hospital follow-up.  ROS: ***       Studies Reviewed: .        *** Risk Assessment/Calculations:    CHA2DS2-VASc Score = 2  {Confirm score is correct.  If not, click here to update score.  REFRESH note.  :1} This indicates a 2.2% annual risk of stroke. The patient's score is based upon: CHF History: 0 HTN  History: 1 Diabetes History: 0 Stroke History: 0 Vascular Disease History: 0 Age Score: 1 Gender Score: 0   {This patient has a significant risk of stroke if diagnosed with atrial fibrillation.  Please consider VKA or DOAC agent for anticoagulation if the bleeding risk is acceptable.   You can also use the SmartPhrase .[email or web address] for documentation.   :[id number]} No BP recorded.  {Refresh Note OR Click here to enter BP  :1}***       Physical Exam:   VS:  There were no vitals taken for this visit.   Wt Readings from Last 3 Encounters:  10/29/22 235 lb (106.6 kg)  09/09/22 239 lb (108.4 kg)  08/31/22 234 lb 12.8 oz (106.5 kg)    GEN: Well nourished, well developed in no acute distress NECK: No JVD; No carotid bruits CARDIAC: ***RRR, no murmurs, rubs, gallops RESPIRATORY:  Clear to auscultation without rales, wheezing or rhonchi  ABDOMEN: Soft, non-tender, non-distended EXTREMITIES:  No edema; No deformity     ASSESSMENT AND PLAN: .    Permanent atrial fibrillation on chronic coumadin therapy: CAD Aortic stenosis: Moderate aortic stenosis with MG 24.0 mmHg and peak gradient 44.7 mmHg on TTE 10/19/22 Mitral regurgitation: Mild MR on TTE 10/19/22 Ascending aortic dilatation: Mild dilatation of aortic root measuring 39 mm, mild dilatation of ascending aorta measuring 41 mm on TTE 10/19/22    {Are you ordering a CV Procedure (e.g. stress test, cath, DCCV, TEE, etc)?   Press F2        :[id number]}  Dispo: ***  Signed, Blake Bridegroom, NP-C

## 2022-11-03 ENCOUNTER — Ambulatory Visit: Payer: PPO | Attending: Nurse Practitioner | Admitting: Nurse Practitioner

## 2022-11-04 DIAGNOSIS — G4733 Obstructive sleep apnea (adult) (pediatric): Secondary | ICD-10-CM | POA: Diagnosis not present

## 2022-11-04 DIAGNOSIS — A419 Sepsis, unspecified organism: Secondary | ICD-10-CM | POA: Diagnosis not present

## 2022-11-04 DIAGNOSIS — D649 Anemia, unspecified: Secondary | ICD-10-CM | POA: Diagnosis not present

## 2022-11-04 DIAGNOSIS — E871 Hypo-osmolality and hyponatremia: Secondary | ICD-10-CM | POA: Diagnosis not present

## 2022-11-04 DIAGNOSIS — R627 Adult failure to thrive: Secondary | ICD-10-CM | POA: Diagnosis not present

## 2022-11-04 DIAGNOSIS — R5381 Other malaise: Secondary | ICD-10-CM | POA: Diagnosis not present

## 2022-11-04 DIAGNOSIS — B955 Unspecified streptococcus as the cause of diseases classified elsewhere: Secondary | ICD-10-CM | POA: Diagnosis not present

## 2022-11-04 DIAGNOSIS — I2510 Atherosclerotic heart disease of native coronary artery without angina pectoris: Secondary | ICD-10-CM | POA: Diagnosis not present

## 2022-11-04 DIAGNOSIS — I4891 Unspecified atrial fibrillation: Secondary | ICD-10-CM | POA: Diagnosis not present

## 2022-11-04 DIAGNOSIS — K5900 Constipation, unspecified: Secondary | ICD-10-CM | POA: Diagnosis not present

## 2022-11-04 DIAGNOSIS — R7881 Bacteremia: Secondary | ICD-10-CM | POA: Diagnosis not present

## 2022-11-04 DIAGNOSIS — I10 Essential (primary) hypertension: Secondary | ICD-10-CM | POA: Diagnosis not present

## 2022-11-14 ENCOUNTER — Inpatient Hospital Stay: Payer: PPO | Admitting: Internal Medicine

## 2022-11-22 DIAGNOSIS — R627 Adult failure to thrive: Secondary | ICD-10-CM | POA: Diagnosis not present

## 2022-11-22 DIAGNOSIS — E871 Hypo-osmolality and hyponatremia: Secondary | ICD-10-CM | POA: Diagnosis not present

## 2022-11-22 DIAGNOSIS — I10 Essential (primary) hypertension: Secondary | ICD-10-CM | POA: Diagnosis not present

## 2022-11-22 DIAGNOSIS — R5381 Other malaise: Secondary | ICD-10-CM | POA: Diagnosis not present

## 2022-11-22 DIAGNOSIS — I4891 Unspecified atrial fibrillation: Secondary | ICD-10-CM | POA: Diagnosis not present

## 2022-11-22 DIAGNOSIS — I2510 Atherosclerotic heart disease of native coronary artery without angina pectoris: Secondary | ICD-10-CM | POA: Diagnosis not present

## 2022-11-22 DIAGNOSIS — D649 Anemia, unspecified: Secondary | ICD-10-CM | POA: Diagnosis not present

## 2022-11-22 DIAGNOSIS — G4733 Obstructive sleep apnea (adult) (pediatric): Secondary | ICD-10-CM | POA: Diagnosis not present

## 2022-11-22 DIAGNOSIS — K5900 Constipation, unspecified: Secondary | ICD-10-CM | POA: Diagnosis not present

## 2022-11-23 ENCOUNTER — Ambulatory Visit: Payer: PPO | Admitting: Gastroenterology

## 2023-01-24 ENCOUNTER — Telehealth: Payer: Self-pay | Admitting: Internal Medicine

## 2023-01-24 ENCOUNTER — Other Ambulatory Visit (HOSPITAL_COMMUNITY): Payer: PPO

## 2023-01-24 NOTE — Telephone Encounter (Signed)
-----   Message from [name] sent at 01/23/2023 10:03 PM EST ----- I do not think he needs echo tomorrow   PLease cancel   Call pt ----- Message ----- From: Franchot Gallo Sent: 01/23/2023  10:05 AM EST To: Pricilla Riffle, MD; Bertram Millard, RN  Dr. Tenny Craw,  I received a message from one of our echo techs asking if Mr. Demott needed to proceed with his scheduled echo for 01/24/23. He had an echo on 8/14 and TEE on 8/20. Do you need him to complete echo tomorrow? Please advise.  Thank you, Danford Bad

## 2023-01-24 NOTE — Telephone Encounter (Signed)
Left message for patient to inform him Dr. Tenny Craw said he does not need another echocardiogram at this time.  Echo appt cancelled. Provided office number for callback if any questions.

## 2023-01-29 NOTE — Progress Notes (Deleted)
Pt did not come in for appt

## 2023-01-30 ENCOUNTER — Ambulatory Visit: Payer: PPO | Admitting: Internal Medicine

## 2023-02-06 DIAGNOSIS — R5381 Other malaise: Secondary | ICD-10-CM | POA: Diagnosis not present

## 2023-02-06 DIAGNOSIS — I4891 Unspecified atrial fibrillation: Secondary | ICD-10-CM | POA: Diagnosis not present

## 2023-02-06 DIAGNOSIS — G4733 Obstructive sleep apnea (adult) (pediatric): Secondary | ICD-10-CM | POA: Diagnosis not present

## 2023-02-06 DIAGNOSIS — K5900 Constipation, unspecified: Secondary | ICD-10-CM | POA: Diagnosis not present

## 2023-02-06 DIAGNOSIS — R627 Adult failure to thrive: Secondary | ICD-10-CM | POA: Diagnosis not present

## 2023-02-06 DIAGNOSIS — D649 Anemia, unspecified: Secondary | ICD-10-CM | POA: Diagnosis not present

## 2023-02-06 DIAGNOSIS — I10 Essential (primary) hypertension: Secondary | ICD-10-CM | POA: Diagnosis not present

## 2023-02-06 DIAGNOSIS — I2510 Atherosclerotic heart disease of native coronary artery without angina pectoris: Secondary | ICD-10-CM | POA: Diagnosis not present

## 2023-02-06 DIAGNOSIS — F419 Anxiety disorder, unspecified: Secondary | ICD-10-CM | POA: Diagnosis not present

## 2023-04-03 ENCOUNTER — Telehealth: Payer: Self-pay

## 2023-04-03 NOTE — Telephone Encounter (Signed)
Unsuccessful attempts to reach patient on preferred numbers listed in notes for scheduled AWV. Left message on voicemail okay to reschedule.

## 2023-04-08 DEATH — deceased

## 2023-04-19 ENCOUNTER — Telehealth: Payer: Self-pay | Admitting: *Deleted

## 2023-04-19 NOTE — Telephone Encounter (Signed)
Called pt's nephew to get an update on the pt and there was no answer so left a messag efor them to call back. Will await a call back.
